# Patient Record
Sex: Female | Born: 1950
Health system: Southern US, Community
[De-identification: ages and names within clinical notes are randomized; demographics above are authoritative.]

## PROBLEM LIST (undated history)

## (undated) DIAGNOSIS — S93699A Other sprain of unspecified foot, initial encounter: Secondary | ICD-10-CM

## (undated) DIAGNOSIS — H269 Unspecified cataract: Secondary | ICD-10-CM

## (undated) DIAGNOSIS — M1990 Unspecified osteoarthritis, unspecified site: Secondary | ICD-10-CM

## (undated) DIAGNOSIS — M545 Low back pain, unspecified: Secondary | ICD-10-CM

## (undated) DIAGNOSIS — R200 Anesthesia of skin: Secondary | ICD-10-CM

## (undated) DIAGNOSIS — G4730 Sleep apnea, unspecified: Secondary | ICD-10-CM

## (undated) DIAGNOSIS — D649 Anemia, unspecified: Secondary | ICD-10-CM

## (undated) DIAGNOSIS — E119 Type 2 diabetes mellitus without complications: Secondary | ICD-10-CM

## (undated) DIAGNOSIS — I10 Essential (primary) hypertension: Secondary | ICD-10-CM

## (undated) DIAGNOSIS — K219 Gastro-esophageal reflux disease without esophagitis: Secondary | ICD-10-CM

## (undated) DIAGNOSIS — E785 Hyperlipidemia, unspecified: Secondary | ICD-10-CM

## (undated) DIAGNOSIS — J40 Bronchitis, not specified as acute or chronic: Secondary | ICD-10-CM

## (undated) DIAGNOSIS — Z8489 Family history of other specified conditions: Secondary | ICD-10-CM

## (undated) DIAGNOSIS — I2510 Atherosclerotic heart disease of native coronary artery without angina pectoris: Secondary | ICD-10-CM

## (undated) HISTORY — PX: TYMPANOPLASTY: SHX33

## (undated) HISTORY — PX: ABDOMINAL HYSTERECTOMY: SHX81

## (undated) HISTORY — DX: Essential (primary) hypertension: I10

## (undated) HISTORY — DX: Type 2 diabetes mellitus without complications: E11.9

## (undated) HISTORY — DX: Low back pain, unspecified: M54.50

## (undated) HISTORY — DX: Gastro-esophageal reflux disease without esophagitis: K21.9

## (undated) HISTORY — DX: Anemia, unspecified: D64.9

## (undated) HISTORY — PX: CORONARY ANGIOPLASTY WITH STENT PLACEMENT: SHX49

## (undated) HISTORY — DX: Other sprain of unspecified foot, initial encounter: S93.699A

## (undated) HISTORY — DX: Low back pain: M54.5

## (undated) HISTORY — PX: TUBAL LIGATION: SHX77

## (undated) HISTORY — PX: UVULOPALATOPHARYNGOPLASTY: SHX827

## (undated) HISTORY — PX: BACK SURGERY: SHX140

---

## 1993-03-25 HISTORY — PX: BREAST CYST ASPIRATION: SHX578

## 1997-07-26 ENCOUNTER — Ambulatory Visit (HOSPITAL_COMMUNITY): Admission: RE | Admit: 1997-07-26 | Discharge: 1997-07-26 | Payer: Self-pay | Admitting: Family Medicine

## 1998-03-29 ENCOUNTER — Ambulatory Visit (HOSPITAL_COMMUNITY): Admission: RE | Admit: 1998-03-29 | Discharge: 1998-03-29 | Payer: Self-pay | Admitting: Family Medicine

## 1998-04-21 ENCOUNTER — Ambulatory Visit (HOSPITAL_COMMUNITY): Admission: RE | Admit: 1998-04-21 | Discharge: 1998-04-21 | Payer: Self-pay | Admitting: Neurology

## 1998-05-30 ENCOUNTER — Encounter (HOSPITAL_COMMUNITY): Admission: RE | Admit: 1998-05-30 | Discharge: 1998-06-05 | Payer: Self-pay | Admitting: Family Medicine

## 1998-08-09 ENCOUNTER — Inpatient Hospital Stay (HOSPITAL_COMMUNITY): Admission: AD | Admit: 1998-08-09 | Discharge: 1998-08-09 | Payer: Self-pay | Admitting: Family Medicine

## 1998-08-18 ENCOUNTER — Ambulatory Visit (HOSPITAL_COMMUNITY): Admission: RE | Admit: 1998-08-18 | Discharge: 1998-08-18 | Payer: Self-pay | Admitting: *Deleted

## 1998-08-29 ENCOUNTER — Other Ambulatory Visit: Admission: RE | Admit: 1998-08-29 | Discharge: 1998-08-29 | Payer: Self-pay | Admitting: Obstetrics & Gynecology

## 1998-08-30 ENCOUNTER — Other Ambulatory Visit: Admission: RE | Admit: 1998-08-30 | Discharge: 1998-08-30 | Payer: Self-pay | Admitting: Obstetrics & Gynecology

## 1998-09-05 ENCOUNTER — Encounter: Payer: Self-pay | Admitting: Obstetrics & Gynecology

## 1998-09-05 ENCOUNTER — Ambulatory Visit (HOSPITAL_COMMUNITY): Admission: RE | Admit: 1998-09-05 | Discharge: 1998-09-05 | Payer: Self-pay | Admitting: Obstetrics & Gynecology

## 1998-09-11 ENCOUNTER — Ambulatory Visit (HOSPITAL_COMMUNITY): Admission: RE | Admit: 1998-09-11 | Discharge: 1998-09-11 | Payer: Self-pay | Admitting: *Deleted

## 1998-11-18 ENCOUNTER — Emergency Department (HOSPITAL_COMMUNITY): Admission: EM | Admit: 1998-11-18 | Discharge: 1998-11-18 | Payer: Self-pay | Admitting: Emergency Medicine

## 1998-11-18 ENCOUNTER — Encounter: Payer: Self-pay | Admitting: Emergency Medicine

## 1998-12-20 ENCOUNTER — Inpatient Hospital Stay (HOSPITAL_COMMUNITY): Admission: RE | Admit: 1998-12-20 | Discharge: 1998-12-22 | Payer: Self-pay | Admitting: Obstetrics & Gynecology

## 1998-12-20 ENCOUNTER — Encounter (INDEPENDENT_AMBULATORY_CARE_PROVIDER_SITE_OTHER): Payer: Self-pay | Admitting: Specialist

## 1999-01-21 ENCOUNTER — Emergency Department (HOSPITAL_COMMUNITY): Admission: EM | Admit: 1999-01-21 | Discharge: 1999-01-21 | Payer: Self-pay | Admitting: Emergency Medicine

## 1999-02-11 ENCOUNTER — Ambulatory Visit (HOSPITAL_COMMUNITY): Admission: RE | Admit: 1999-02-11 | Discharge: 1999-02-11 | Payer: Self-pay | Admitting: Specialist

## 1999-02-11 ENCOUNTER — Encounter: Payer: Self-pay | Admitting: Specialist

## 1999-03-22 ENCOUNTER — Encounter: Payer: Self-pay | Admitting: Specialist

## 1999-03-22 ENCOUNTER — Ambulatory Visit (HOSPITAL_COMMUNITY): Admission: RE | Admit: 1999-03-22 | Discharge: 1999-03-22 | Payer: Self-pay | Admitting: Specialist

## 1999-04-05 ENCOUNTER — Ambulatory Visit (HOSPITAL_COMMUNITY): Admission: RE | Admit: 1999-04-05 | Discharge: 1999-04-05 | Payer: Self-pay | Admitting: Specialist

## 1999-04-05 ENCOUNTER — Encounter: Payer: Self-pay | Admitting: Specialist

## 1999-08-02 ENCOUNTER — Ambulatory Visit (HOSPITAL_COMMUNITY): Admission: EM | Admit: 1999-08-02 | Discharge: 1999-08-02 | Payer: Self-pay | Admitting: Emergency Medicine

## 1999-08-02 ENCOUNTER — Encounter: Payer: Self-pay | Admitting: *Deleted

## 1999-08-03 ENCOUNTER — Encounter: Admission: RE | Admit: 1999-08-03 | Discharge: 1999-08-03 | Payer: Self-pay | Admitting: Family Medicine

## 1999-08-03 ENCOUNTER — Encounter: Payer: Self-pay | Admitting: Family Medicine

## 1999-08-13 ENCOUNTER — Encounter: Payer: Self-pay | Admitting: Family Medicine

## 1999-08-13 ENCOUNTER — Ambulatory Visit (HOSPITAL_COMMUNITY): Admission: RE | Admit: 1999-08-13 | Discharge: 1999-08-13 | Payer: Self-pay | Admitting: Family Medicine

## 1999-10-17 ENCOUNTER — Other Ambulatory Visit: Admission: RE | Admit: 1999-10-17 | Discharge: 1999-10-17 | Payer: Self-pay | Admitting: Obstetrics & Gynecology

## 2000-02-01 ENCOUNTER — Encounter: Payer: Self-pay | Admitting: Family Medicine

## 2000-02-01 ENCOUNTER — Encounter: Admission: RE | Admit: 2000-02-01 | Discharge: 2000-02-01 | Payer: Self-pay | Admitting: Family Medicine

## 2000-02-24 ENCOUNTER — Ambulatory Visit (HOSPITAL_BASED_OUTPATIENT_CLINIC_OR_DEPARTMENT_OTHER): Admission: RE | Admit: 2000-02-24 | Discharge: 2000-02-24 | Payer: Self-pay | Admitting: Family Medicine

## 2000-04-16 ENCOUNTER — Encounter: Admission: RE | Admit: 2000-04-16 | Discharge: 2000-04-28 | Payer: Self-pay | Admitting: Occupational Medicine

## 2000-04-25 ENCOUNTER — Other Ambulatory Visit: Admission: RE | Admit: 2000-04-25 | Discharge: 2000-04-25 | Payer: Self-pay | Admitting: Obstetrics & Gynecology

## 2000-04-25 ENCOUNTER — Encounter (INDEPENDENT_AMBULATORY_CARE_PROVIDER_SITE_OTHER): Payer: Self-pay | Admitting: Specialist

## 2000-08-14 ENCOUNTER — Ambulatory Visit (HOSPITAL_COMMUNITY): Admission: RE | Admit: 2000-08-14 | Discharge: 2000-08-14 | Payer: Self-pay | Admitting: Family Medicine

## 2000-08-14 ENCOUNTER — Encounter: Payer: Self-pay | Admitting: Family Medicine

## 2001-02-17 ENCOUNTER — Other Ambulatory Visit: Admission: RE | Admit: 2001-02-17 | Discharge: 2001-02-17 | Payer: Self-pay | Admitting: Obstetrics & Gynecology

## 2001-02-27 ENCOUNTER — Ambulatory Visit (HOSPITAL_COMMUNITY): Admission: RE | Admit: 2001-02-27 | Discharge: 2001-02-27 | Payer: Self-pay | Admitting: Family Medicine

## 2001-02-27 ENCOUNTER — Encounter: Payer: Self-pay | Admitting: Family Medicine

## 2001-08-13 ENCOUNTER — Encounter: Admission: RE | Admit: 2001-08-13 | Discharge: 2001-11-11 | Payer: Self-pay | Admitting: Family Medicine

## 2001-08-18 ENCOUNTER — Encounter: Payer: Self-pay | Admitting: Family Medicine

## 2001-08-18 ENCOUNTER — Ambulatory Visit (HOSPITAL_COMMUNITY): Admission: RE | Admit: 2001-08-18 | Discharge: 2001-08-18 | Payer: Self-pay | Admitting: Family Medicine

## 2002-02-19 ENCOUNTER — Other Ambulatory Visit: Admission: RE | Admit: 2002-02-19 | Discharge: 2002-02-19 | Payer: Self-pay | Admitting: Obstetrics & Gynecology

## 2002-09-14 ENCOUNTER — Encounter: Payer: Self-pay | Admitting: Obstetrics & Gynecology

## 2002-09-14 ENCOUNTER — Ambulatory Visit (HOSPITAL_COMMUNITY): Admission: RE | Admit: 2002-09-14 | Discharge: 2002-09-14 | Payer: Self-pay | Admitting: Obstetrics & Gynecology

## 2003-09-23 ENCOUNTER — Ambulatory Visit (HOSPITAL_COMMUNITY): Admission: RE | Admit: 2003-09-23 | Discharge: 2003-09-23 | Payer: Self-pay | Admitting: Obstetrics & Gynecology

## 2004-09-27 ENCOUNTER — Ambulatory Visit (HOSPITAL_COMMUNITY): Admission: RE | Admit: 2004-09-27 | Discharge: 2004-09-27 | Payer: Self-pay | Admitting: Obstetrics & Gynecology

## 2005-10-11 ENCOUNTER — Ambulatory Visit (HOSPITAL_COMMUNITY): Admission: RE | Admit: 2005-10-11 | Discharge: 2005-10-11 | Payer: Self-pay | Admitting: Obstetrics & Gynecology

## 2006-09-26 ENCOUNTER — Emergency Department (HOSPITAL_COMMUNITY): Admission: EM | Admit: 2006-09-26 | Discharge: 2006-09-26 | Payer: Self-pay | Admitting: Emergency Medicine

## 2006-10-17 ENCOUNTER — Ambulatory Visit (HOSPITAL_COMMUNITY): Admission: RE | Admit: 2006-10-17 | Discharge: 2006-10-17 | Payer: Self-pay | Admitting: Obstetrics & Gynecology

## 2006-11-07 ENCOUNTER — Ambulatory Visit (HOSPITAL_COMMUNITY): Admission: RE | Admit: 2006-11-07 | Discharge: 2006-11-07 | Payer: Self-pay | Admitting: Gastroenterology

## 2007-01-22 ENCOUNTER — Ambulatory Visit (HOSPITAL_BASED_OUTPATIENT_CLINIC_OR_DEPARTMENT_OTHER): Admission: RE | Admit: 2007-01-22 | Discharge: 2007-01-22 | Payer: Self-pay | Admitting: Otolaryngology

## 2007-03-26 HISTORY — PX: CAROTID STENT: SHX1301

## 2007-10-21 ENCOUNTER — Ambulatory Visit (HOSPITAL_COMMUNITY): Admission: RE | Admit: 2007-10-21 | Discharge: 2007-10-21 | Payer: Self-pay | Admitting: Obstetrics & Gynecology

## 2007-12-14 ENCOUNTER — Ambulatory Visit (HOSPITAL_COMMUNITY): Admission: RE | Admit: 2007-12-14 | Discharge: 2007-12-14 | Payer: Self-pay | Admitting: Family Medicine

## 2008-07-29 ENCOUNTER — Ambulatory Visit (HOSPITAL_BASED_OUTPATIENT_CLINIC_OR_DEPARTMENT_OTHER): Admission: RE | Admit: 2008-07-29 | Discharge: 2008-07-29 | Payer: Self-pay | Admitting: Orthopedic Surgery

## 2008-08-26 ENCOUNTER — Ambulatory Visit (HOSPITAL_COMMUNITY): Admission: RE | Admit: 2008-08-26 | Discharge: 2008-08-26 | Payer: Self-pay | Admitting: Specialist

## 2008-09-22 HISTORY — PX: NM MYOCAR PERF WALL MOTION: HXRAD629

## 2008-10-06 ENCOUNTER — Encounter: Admission: RE | Admit: 2008-10-06 | Discharge: 2008-10-06 | Payer: Self-pay | Admitting: Cardiology

## 2008-10-12 ENCOUNTER — Inpatient Hospital Stay (HOSPITAL_COMMUNITY): Admission: AD | Admit: 2008-10-12 | Discharge: 2008-10-13 | Payer: Self-pay | Admitting: Cardiology

## 2008-10-20 ENCOUNTER — Encounter (HOSPITAL_COMMUNITY): Admission: RE | Admit: 2008-10-20 | Discharge: 2009-01-18 | Payer: Self-pay | Admitting: Cardiology

## 2009-02-01 ENCOUNTER — Ambulatory Visit (HOSPITAL_COMMUNITY): Admission: RE | Admit: 2009-02-01 | Discharge: 2009-02-01 | Payer: Self-pay | Admitting: Family Medicine

## 2009-08-01 HISTORY — PX: DOPPLER ECHOCARDIOGRAPHY: SHX263

## 2010-01-11 ENCOUNTER — Emergency Department (HOSPITAL_COMMUNITY): Admission: EM | Admit: 2010-01-11 | Discharge: 2010-01-11 | Payer: Self-pay | Admitting: Family Medicine

## 2010-03-01 ENCOUNTER — Ambulatory Visit (HOSPITAL_COMMUNITY)
Admission: RE | Admit: 2010-03-01 | Discharge: 2010-03-01 | Payer: Self-pay | Source: Home / Self Care | Attending: Family Medicine | Admitting: Family Medicine

## 2010-04-16 ENCOUNTER — Encounter: Payer: Self-pay | Admitting: Family Medicine

## 2010-06-20 HISTORY — PX: OTHER SURGICAL HISTORY: SHX169

## 2010-06-30 LAB — GLUCOSE, CAPILLARY
Glucose-Capillary: 170 mg/dL — ABNORMAL HIGH (ref 70–99)
Glucose-Capillary: 153 mg/dL — ABNORMAL HIGH (ref 70–99)
Glucose-Capillary: 162 mg/dL — ABNORMAL HIGH (ref 70–99)
Glucose-Capillary: 174 mg/dL — ABNORMAL HIGH (ref 70–99)
Glucose-Capillary: 177 mg/dL — ABNORMAL HIGH (ref 70–99)

## 2010-07-01 LAB — BASIC METABOLIC PANEL
BUN: 14 mg/dL (ref 6–23)
CO2: 25 mEq/L (ref 19–32)
Chloride: 107 mEq/L (ref 96–112)
Glucose, Bld: 147 mg/dL — ABNORMAL HIGH (ref 70–99)
Potassium: 3.4 mEq/L — ABNORMAL LOW (ref 3.5–5.1)

## 2010-07-01 LAB — CBC
HCT: 33.7 % — ABNORMAL LOW (ref 36.0–46.0)
Hemoglobin: 11.3 g/dL — ABNORMAL LOW (ref 12.0–15.0)
MCHC: 33.5 g/dL (ref 30.0–36.0)
MCV: 76.6 fL — ABNORMAL LOW (ref 78.0–100.0)
Platelets: 297 10*3/uL (ref 150–400)
RBC: 4.4 MIL/uL (ref 3.87–5.11)
RDW: 14.9 % (ref 11.5–15.5)
WBC: 10.7 10*3/uL — ABNORMAL HIGH (ref 4.0–10.5)

## 2010-07-01 LAB — GLUCOSE, CAPILLARY
Glucose-Capillary: 117 mg/dL — ABNORMAL HIGH (ref 70–99)
Glucose-Capillary: 137 mg/dL — ABNORMAL HIGH (ref 70–99)

## 2010-07-03 LAB — BASIC METABOLIC PANEL
BUN: 15 mg/dL (ref 6–23)
Calcium: 9.5 mg/dL (ref 8.4–10.5)
Creatinine, Ser: 0.76 mg/dL (ref 0.4–1.2)
GFR calc non Af Amer: >60 mL/min (ref >60)
Glucose, Bld: 173 mg/dL — ABNORMAL HIGH (ref 70–99)
Potassium: 4 mEq/L (ref 3.5–5.1)

## 2010-08-07 NOTE — Discharge Summary (Signed)
[name], [place]                  ACCOUNT NO.:  [id number]   MEDICAL RECORD NO.:  [id number]          PATIENT TYPE:  INP   LOCATION:  2503                         FACILITY:  [facility]   PHYSICIAN:  Sheliah Mends, MD      DATE OF BIRTH:  [birth year]   DATE OF ADMISSION:  10/12/2008  DATE OF DISCHARGE:  10/13/2008                               DISCHARGE SUMMARY   DISCHARGE DIAGNOSES:  1. Abnormal preoperative myocardial perfusion study.  2. Coronary artery disease with elective left anterior descending      XIENCE stenting this admission.  3. Treated hypertension.  4. Dyslipidemia.  5. Type 2 diabetes.  6. Degenerative joint disease with herniated disk, the patient was      seen for preoperative clearance prior to surgical repair.   HOSPITAL COURSE:  The patient is a pleasant 60 year old female, who was  referred to Dr. Garen Lah for evaluation of risk factors prior to  proposed herniated disk repair.  She was seen by Dr. Garen Lah in the  office to set up for outpatient Myoview.  This was done on September 22, 2008.  EF was 83% with mild ischemia in the mid anterior and apical lateral  region.  Echocardiogram revealed normal LV function with evidence of  impaired LV relaxation.  The patient was set up for diagnostic  catheterization, which was done on October 12, 2008, by Dr. Garen Lah.  She  had insignificant disease in the RCA and circumflex with an 85% proximal  LAD stenosis.  She underwent PCI of the LAD with a XIENCE stent by Dr.  Allyson Sabal on October 12, 2008.  We feel that she can be discharged on October 13, 2008.  She will see Dr. Garen Lah in a couple weeks in the office.   LABORATORIES:  White count 10.7, hemoglobin 11.3, hematocrit 33.7, and  platelets 297.  Sodium 137, potassium 3.4, BUN 14, and creatinine 0.75.  Chest x-ray shows no active disease.  EKG shows sinus rhythm without  acute changes.   DISPOSITION:  The patient is discharged in stable condition.  She will  follow up with Dr.  Garen Lah in a couple of weeks.   DISCHARGE MEDICATIONS:  1. Aspirin 325 mg a day.  2. Plavix 75 mg a day.  3. Estradiol 0.25 mg daily.  4. Micardis HCT 40/12.5 daily.  5. TriCor 145 daily.  6. Iron daily.  7. Multivitamin daily.  8. Vicodin 1-2 q.6 h. p.r.n.  9. The patient had been on Nexium and this will be changed to      Protonix.  10.We will also give her prescription for nitroglycerin sublingual      p.r.n.  11.She had been on Avandamet and we will hold this until July 24.      Abelino Derrick, P.A.      Sheliah Mends, MD  Electronically Signed    LKK/MEDQ  D:  10/13/2008  T:  10/14/2008  Job:  [id number]   cc:   Jene Every, M.D.  Pearline Cables, MD

## 2010-08-07 NOTE — Op Note (Signed)
[name], [name]                  ACCOUNT NO.:  [id number]   MEDICAL RECORD NO.:  [id number]          PATIENT TYPE:  AMB   LOCATION:  DSC                          FACILITY:  [facility]   PHYSICIAN:  [name] Fitch. Simpson, M.D. DATE OF BIRTH:  [birth year]   DATE OF PROCEDURE:  07/29/2008  DATE OF DISCHARGE:                               OPERATIVE REPORT   PREOPERATIVE DIAGNOSIS:  Large draining mucoid cyst right long finger  distal interphalangeal joint dorsal aspect with x-ray evidence of  significant degenerative arthritis of distal interphalangeal joint.   POSTOPERATIVE DIAGNOSIS:  Large draining mucoid cyst right long finger  distal interphalangeal joint dorsal aspect with x-ray evidence of  significant degenerative arthritis of distal interphalangeal joint with  identification of significant synovitis and a large subcutaneous mucoid  cyst.   OPERATION:  1. Resection of mucoid cyst.  2. Distal interphalangeal joint debridement and synovectomy.   SURGEON:  [name] Fitch. Sypher, MD   ASSISTANT:  [name] Rusk, PA-C   ANESTHESIA:  Lidocaine 2% metacarpal head level block right long finger  supplemented by IV sedation.   SUPERVISING ANESTHESIOLOGIST:  [name] Person, MD   INDICATIONS:  [name] is a 60 year old woman referred through the  courtesy of Dr. Luanna Simpson.  Clinical examination revealed a large  draining mucoid cyst of the dorsal aspect of the right long finger  adjacent to the DIP joint and nail fold.  She did not have a nail  deformity.  We discussed the potential risks and benefits of  debridement.  We recommended proceeding with debridement to prevent  infection of the distal interphalangeal joint as a consequence of  rupture of the cyst.  Preoperatively, she was advised that we could not  change the underlying osteoarthrosis.  Typically with synovectomy and  joint debridement recurrent cyst formation is minimized.  This should  resolve the problem of the draining sinus  tract.   After informed consent, she is brought to the operating room at this  time.   PROCEDURE:  [name] is brought to the operating room and placed in  supine position upon the operating table.  Following an anesthesia  consult with Dr. Gypsy Simpson, local anesthesia with sedation was recommended  and accepted.  She was brought to room 1 at the [facility] and  placed in supine position upon the operating table.  Following routine  Betadine scrub and paint, the right arm was draped with stockinette and  impervious arthroscopy drapes.  The long finger was exsanguinated with a  gauze wrap and a 1/2-inch Penrose drain placed as a digital tourniquet.  The procedure commenced with a sharp round incision directly over the  cyst.  Subcutaneous tissues were carefully divided taking care to  identify the entire cyst membrane and followed back down to the DIP  joint.  The radial and ulnar aspects of the DIP joint were inspected and  the capsule resected between the terminal extensor tendon slip and the  collateral ligaments.  A micro [name] was used to palpate the joint  followed by irrigation of the joint with a  blunt dental needle and  sterile saline under pressure.  A synovectomy of the DIP joint dorsal  aspect was accomplished with a micro rongeur.   The wound was then repaired with multiple interrupted sutures of 5-0  nylon.  There were no apparent complications.  The tourniquet was  released from the proximal phalangeal segment of the finger with  immediate capillary refill.  Finger was dressed with Xeroflo, sterile  gauze and Coban.   We will see [name] back for followup in approximately 1 week for a  dressing change.  We anticipate suture removal in 7-10 days.      [name], M.D.  Electronically Signed     RVS/MEDQ  D:  07/29/2008  T:  07/30/2008  Job:  [id number]   cc:   [name], M.D.

## 2010-08-07 NOTE — Cardiovascular Report (Signed)
[name], [name]                  ACCOUNT NO.:  [id number]   MEDICAL RECORD NO.:  [id number]          PATIENT TYPE:  INP   LOCATION:  2503                         FACILITY:  [facility]   PHYSICIAN:  Sheliah Mends, MD      DATE OF BIRTH:  20-Jul-1950   DATE OF PROCEDURE:  10/12/2008  DATE OF DISCHARGE:                            CARDIAC CATHETERIZATION   INDICATIONS:  [name] is a 60 year old lady with history of  hypertension, dyslipidemia, and type 2 diabetes mellitus, who was  referred for preoperative evaluation prior to extensive back surgery.  As part of her preoperative evaluation, she underwent a myocardial  perfusion scan that showed evidence of ischemia in the anterior wall.  Given [name]'s risk factors and abnormal stress test findings, a  decision was made to proceed with cardiac catheterization.   PROCEDURE:  1. Selective coronary angiography.  2. Left ventriculography.  3. Left heart catheterization.   After informed consent was obtained, the patient was brought to the  Second Floor Cardiac Catheterization Lab at [facility] in the  postabsorptive state.  She was prepped and draped in a sterile fashion.  Local anesthesia was applied to the right groin using 1% lidocaine.  Conscious sedation was began using Versed and fentanyl.  A 5-French  arterial sheath was inserted into the right femoral artery using the  modified Seldinger technique.  A 6-French right and left Judkins  catheter was used for selective coronary angiography.  A pigtail  catheter was used for left ventriculography was.  Subsequently, the 6-  [place] RCA catheter was exchanged for 4-French catheter to image the RCA  ostium.   FINDINGS:  Hemodynamics:  Aortic pressure 158/65 mmHg.  LV pressure  164/4 mmHg.  There was no significant aortic gradient noted.   Left ventriculography:  The left ventriculography showed normal left  ventricular size and function.   SELECTIVE CORONARY ANGIOGRAPHY:   Left main coronary artery:  The left  main coronary artery is a medium-sized vessel that bifurcates in the LAD  and left circumflex artery.  The left main coronary artery has 10%, mild  diffuse lesion in the distal segment.   Left anterior descending artery:  The left anterior descending artery is  a large vessel.  It gives off a large diagonal branch.  There is a  focal, very eccentric 85% lesion in the proximal segment of the LAD  prior to the first septal branch.  There are some mild luminal  irregularities in the reminder of the vessel without hemodynamic  significant lesion.  The diagonal artery is free of disease.   Left circumflex artery:  The left circumflex artery is a large vessel  that gives a large obtuse marginal branch off.  There is a 10% lesion at  the ostium and 20% in the proximal and mid segment of the left  circumflex artery.  The obtuse marginal branch is a large, branching  vessel without significant disease.   RCA:  The RCA is a dominant vessel that gives off a PDA branch.  The RCA  has mild luminal irregularities  and a 20% lesion in the mid segment, but  is otherwise without hemodynamic significant lesion.   SUMMARY:  1. High-grade, 85% lesion in the proximal left anterior descending      artery.  2. Mild diffuse disease in the left main, left circumflex, and RCA      territory without hemodynamic significance.  3. Normal left ventricular size and function.  4. No hemodynamic significant valve disease seen.   PLAN:  [name] will proceed to percutaneous coronary intervention with  Dr. Allyson Sabal.  She understands the nature of the procedure including risks  and benefits and agreed to proceed.   The procedure was completed.  The diagnostic part of the procedure was  completed without complications.      Sheliah Mends, MD  Electronically Signed     JE/MEDQ  D:  10/12/2008  T:  10/12/2008  Job:  [id number]

## 2010-08-07 NOTE — Op Note (Signed)
[name], [name]                  ACCOUNT NO.:  [id number]   MEDICAL RECORD NO.:  [id number]          PATIENT TYPE:  AMB   LOCATION:  ENDO                         FACILITY:  [facility]   PHYSICIAN:  Anselmo Rod, M.D.  DATE OF BIRTH:  December 12, 1950   DATE OF PROCEDURE:  11/07/2006  DATE OF DISCHARGE:                               OPERATIVE REPORT   PROCEDURE PERFORMED:  Screening colonoscopy.   ENDOSCOPIST:  Anselmo Rod, M.D.   INSTRUMENT USED:  Pentax video colonoscope.   INDICATIONS FOR PROCEDURE:  A 60 year old white female undergoing a  screening colonoscopy to rule out colonic polyps, masses, etcetera.   PREPROCEDURE PREPARATION:  Informed consent was procured from the  patient.  The patient fasted for 8 hours prior to the procedure and  prepped with a bottle of magnesium citrate in a gallon of NuLYTELY the  night prior to procedure.  From Risks and benefits of the procedure  including a 10% missed rate of cancer and polyps were discussed with the  patient as well.   PREPROCEDURE PHYSICAL:  VITAL SIGNS: The patient with stable vital  signs.  NECK:  Supple.  CHEST:  Clear to auscultation.  HEART:  S1, S2 regular.  ABDOMEN:  Soft with normal bowel sounds.   DESCRIPTION OF PROCEDURE:  The patient was placed in the left lateral  decubitus position and sedated with 100 mcg of Fentanyl and 12 mg of  Versed given intravenously in slow incremental doses. Once the patient  was adequately sedated and maintained on low-flow oxygen, and continuous  cardiac monitoring; the Pentax video colonoscope was advanced from the  rectum to the cecum.  The appendiceal orifice and cecal valve were  clearly visualized and photographed. The terminal ileum appeared healthy  without lesions.  No masses, polyps, erosions, ulcerations, or  diverticula were noted. Retroflexion in the rectum revealed no  abnormality.  The patient tolerated the procedure well without  complications.   IMPRESSION:   Normal colonoscopy up to the terminal ileum with no masses,  polyps or diverticula noted.   RECOMMENDATIONS:  1. Continue a high fiber diet with liberal fluid intake.  2. Repeat colonoscopy in the next 10 years unless the patient has any      abnormal symptoms in the interim; in which case she should contact      the office, immediately, for further recommendations.      Anselmo Rod, M.D.  Electronically Signed     JNM/MEDQ  D:  11/07/2006  T:  11/07/2006  Job:  [id number]   cc:   Quita Skye. Artis Flock, M.D.  Fax: [phone]   Genia Del, M.D.  Fax: [phone]

## 2010-08-07 NOTE — Op Note (Signed)
NAMEKRISTIA, [name]                  ACCOUNT NO.:  [id number]   MEDICAL RECORD NO.:  [id number]          PATIENT TYPE:  AMB   LOCATION:  DSC                          FACILITY:  [facility]   PHYSICIAN:  Carolan Shiver, M.D.    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  Mrs.  [name] made had previously undergone type 1 tympanoplasty, right ear, by  Dr. Prescott Gum many years ago.  She has developed a recurrent  perforation and had noticed decreased hearing in the right ear and had  been treated by Dr. Artis Flock with amoxicillin and Cipro drops.  Her URI was  on September 25, 2006.  Since that time she has had some minor disequilibrium  and occasional nausea with a burning sensation in her right ear, with  pain radiating into her right mastoid tip.  On November 13, 2006, she was  diagnosed as having of 30% dry central inferior perforation AD.  She had  an atrophic TM, left ear, status post tympanoplasty.  She was found to  have moderate mixed hearing loss, right ear, with a moderate high-  frequency sensorineural hearing loss, left ear.  Preop audiometric  testing on January 20, 2007, documented an SRT of 30 diabetes, right  ear, with 100% discrimination, 15 diabetes, left ear, with 100%  discrimination.  A CT scan her temporal bones was obtained on November 17, 2006, and showed no evidence of any mastoid involvement on the right  side.  She was therefore recommended for a revision type 1 cartilage  tympanoplasty under general endotracheal anesthesia at [facility]  under general endotracheal anesthesia as an outpatient.  Risks and  complications of the procedures were explained to her, questions were  invited and  answered. and informed consent was signed and witnessed.   JUSTIFICATION FOR OUTPATIENT SETTING:  This patient's age and need for  general endotracheal anesthesia.   JUSTIFICATION FOR OVERNIGHT STAY:  Not applicable.   PREOPERATIVE DIAGNOSIS:  A 30% dry central anterior perforation, right  ear.   POSTOPERATIVE DIAGNOSIS:  A 30% dry central anterior perforation, right  ear.   OPERATION:  1. Revision type 1 cartilage tympanoplasty, right ear.  2. Microdissection using the operating room microscope.   SURGEON:  Carolan Shiver, M.D.   ANESTHESIA:  General endotracheal, W. Autumn Patty, MD   COMPLICATIONS:  None.   DISCHARGE STATUS:  Stable.   SUMMARY OF REPORT:  After the patient was taken to the operating room,  she was placed in the supine position.  An IV had been begun in the  holding area.  General IV induction was then performed under the  guidance of Dr. Sampson Goon.  The patient was orally intubated without  difficulty, eyelids were taped shut, and she was properly positioned and  monitored.  Elbows and ankles were padded with foam rubber and a time-  out was performed.  A small amount of hair was clipped in the right  postauricular area.  Hair was taped and a stocking cap was applied.  The  right postauricular area was infiltrated with 3 mL of 1% Xylocaine with  1:100,000 epinephrine including a small amount of local anesthesia  infiltrated into the right tragus.  Her right ear hemiface were then  prepped with Betadine and draped in a standard fashion for a revision  tympanoplasty.   Examination of the right ear canal revealed a 7-mm diameter canal.  There was a 30% dry central anterior perforation, through which could be  seen healthy middle ear mucosa.  There was no evidence of any  cholesteatoma.  Four quadrant ear canal blocks then performed with 1 mL  of 2% Xylocaine with 1:15,000 epinephrine.   A 1-cm incision was then made in the medial surface of the right  tragus.  A composite cartilage-perichondrial autograft was then harvested.  The  incision was closed with interrupted 5-0 Novofils and bacitracin  ointment was applied.   The epithelial margin of the perforation was then excised with a 5910  Beaver blade.  The mucosal surface was rasped with a 45-degree McCabe  perforation rasp.   An atticotomy flap was then elevated with McCabe and [employer] and  the middle ear was entered without difficulty.  The ossicles were found  to be intact and mobile.  There was healthy middle ear mucosa, no  evidence of any cholesteatoma.   The previously-harvested CCPA graft was then trimmed with a eBay,  leaving a disc oval of cartilage on the perichondrium and a cuff of  normal perichondrium around the oval as well as a tail of perichondrium.  The graft was then placed with the cartilage side lateral.  This was  placed in the perforation and the perichondrium was then draped up the  posterior bony canal wall.  The graft was then brought back out through  the perforation and folded 180 degrees on itself.  The hypotympanum was  then packed through the perforation using Gelfoam discs soaked in Cipro-  HC.  The graft was then rotated 180 degrees and the perichondrial cuff  was tucked around the edges of the perforation for 360 degrees with the  cartilage filling the perforation.  The posterior hypotympanum was then  packed with Gelfoam discs soaked in Cipro-HC to support the graft  posteriorly.  The perichondrium and atticotomy flap were then draped up  the posterior bony canal wall into anatomic position.  Medial canal was  then packed with Gelfoam discs soaked in Cipro-HC.  A bacitracin cotton  ball was placed in the ear canal.  The patient was awakened, extubated  and transferred to her hospital bed.  She appeared to tolerate both the  general endotracheal anesthesia and the procedures well and left the  operating room in stable condition.    TOTAL FLUIDS:  750 mL.   ESTIMATED BLOOD LOSS:  Less than 5 mL.   Sponge, needle, and cotton ball counts were correct at termination of  the procedure.  The patient received Ancef 1 g IV, Zofran 4 mg IV at the  beginning and end of the procedure, and Decadron 10 mg IV.   Mrs. Montano will be discharged today as an outpatient with her husband.  He will be instructed to return her to my office on January 29, 2007, at  1:20 p.m.  Discharge medications include Cefzil 500 mg p.o. b.i.d. x10  days with food, Cipro-HC three drops right ear t.i.d. x1 week, Percocet  5/325 mg #30 one to two p.o. q.6h. p.r.n. pain, and Phenergan  suppositories 25 mg #2 one p.r. q.6h p.r.n. nausea.  She is to keep her  head elevated, avoid water  exposure, right ear, x6 weeks, avoid nose-blowing or sneezing without  her mouth open, and follow a regular diet.  She is to call [phone] for  any postoperative problems related to the procedure.  She will be given  both verbal and written instructions.  Postop audiometric testing will  be performed in the office           ______________________________  Carolan Shiver, M.D.     EMK/MEDQ  D:  01/22/2007  T:  01/22/2007  Job:  [id number]

## 2010-08-07 NOTE — Cardiovascular Report (Signed)
NAMEARNETHA, SILVERTHORNE                  ACCOUNT NO.:  [id number]   MEDICAL RECORD NO.:  [id number]          PATIENT TYPE:  INP   LOCATION:  2503                         FACILITY:  [facility]   PHYSICIAN:  Nanetta Batty, M.D.   DATE OF BIRTH:  10-01-1950   DATE OF PROCEDURE:  DATE OF DISCHARGE:                            CARDIAC CATHETERIZATION   HISTORY OF PRESENT ILLNESS:  Ms. Mcniel is a 60 year old female patient of  Dr. Garen Lah who was evaluated preoperatively for planned spine surgery.  Risk factors include hypertension, hyperlipidemia, type 2 diabetes, and  obesity.  A Myoview stress test showed subtle anterior ischemia.  She  underwent diagnostic coronary arteriography by Dr. Garen Lah today  revealing an 80% fairly focal proximal LAD lesion at the first septal  perforator with normal LV function.  She presents now for PCI and  stenting.   DESCRIPTION OF PROCEDURE:  The patient was already in the Cath Lab  having undergone diagnostic coronary arteriography by Dr. Garen Lah.  She  had a 6-French sheath in the right femoral artery and had received four  baby aspirins.  She received 600 mg of p.o. Plavix, 20 mg of Pepcid I.V.  and Angiomax bolus with ACT documented 293.  Visipaque dye was used for  the entirety of the case.  Retrograde aortic pressures were monitored in  the case.   Using a 6-French XBLAD 3.5 guide catheter along with an 0.014 x 190  [employer] soft wire and a 3.0 x 15 Xience V Abbott drug-eluting stent.  PCI  was performed with delivery at 14 atmospheres.  Intracoronary  nitroglycerin 200 mcg was administered.  Postdilatation was performed  with a 3.25 mm x 12 mm long Simpson Voyager at 16 atmospheres resulting  reduction of 80% stenosis to 0% residual without dissection.  The  patient tolerated the procedure well.  The guidewire and catheter were  removed and sheath was sewn securely in place.  The patient left the lab  in stable condition.   PLAN:  To remove the sheath in 2-3  hours, treat with aspirin and Plavix,  gently hydrate and discharge home in the morning.  The patient will  follow up with Dr. Garen Lah.  She left the lab in stable condition.      Nanetta Batty, M.D.  Electronically Signed     JB/MEDQ  D:  10/12/2008  T:  10/12/2008  Job:  [id number]   cc:   Second Floor  Cardiac Cath Lab  [facility]  Sheliah Mends, MD  Pearline Cables, MD

## 2010-08-10 NOTE — Discharge Summary (Signed)
[facility]  Patient:    [name]                          MRN: [id number] Adm. Date:  [id number] Disc. Date: 12/22/98 Attending:  Genia Del                           Discharge Summary  REASON FOR ADMISSION:  Left pelvic pain with left persistent simple ovarian cyst and abnormal uterine bleeding on hormone replacement therapy in a patient with ild uterine leiomyoma.  DISCHARGE DIAGNOSES: 1. Left pelvic pain with left persistent simple ovarian cyst. 2. Abnormal uterine bleeding on hormone replacement therapy. 3. Uterine leiomyoma. 4. Pathology report - uterus, salpinx, tubes and ovaries, cervix with no pathologic    abnormality, benign proliferative endometrium, adenomyosis, bilateral benign    peritubal cysts, bilateral ovaries with no pathologic abnormalities.  PROCEDURES:  Total abdominal hysterectomy with bilateral salpingo-oophorectomy one on September 27.  HOSPITAL COURSE:  Unremarkable.  The patient did not develop any complications.  The postoperative hemoglobin was 11.1 on September 28 with hematocrit of 34.4. The patient was discharged home on September 29 with Darvocet and Motrin p.r.n. for  pain.  The patient will stop Provera since hysterectomy was performed and will continue Climara patches for estrogen replacement.  She will be followed up on January 12, 1999 at [facility] office and her staples will be removed on postoperative day #3 at [facility] office. DD:  01/27/99 TD:  01/29/99 Job: [id number]

## 2010-08-10 NOTE — Procedures (Signed)
[facility]  Patient:    [name], [name]                         MRN: [id number] Proc. Date: 08/02/99 Adm. Date:  [id number] Disc. Date: [id number] Attending:  Donnetta Hutching CC:         Donnetta Hutching, M.D.                           Procedure Report  PROCEDURE PERFORMED:  Video upper endoscopy.  ENDOSCOPIST:  Roosvelt Harps, M.D.  INDICATIONS FOR PROCEDURE:  The patient is a 61 year old female with suspected foreign body meat impaction in the distal esophagus.  However, she has been getting small amounts of liquid down and when I questioned her about the discomfort, she says that although it feels like something is stuck and it hurts to swallow, she is having a lot of chest pain that radiates into her right scapula raising a question of biliary disease.  PREPARATION:  She is n.p.o.  PREPROCEDURE SEDATION:  She received 70 mg of Demerol and 7 mg of Versed intravenously.  In addition, her throat was anesthetized with Hurricaine spray nd she was on 2L of nasal cannula O2.  PREPROCEDURE EXAM:  Clear lungs.  Normal heart sounds.  Obese abdomen with minimal epigastric tenderness.  No masses.  DESCRIPTION OF PROCEDURE:  The Olympus video upper endoscope was inserted via the mouth and advanced easily through the upper esophageal sphincter.  Intubation was then carried out to the descending duodenum.  On withdrawal the mucosa was carefully evaluated and found to be entirely normal from descending duodenum through entire stomach and esophagus.  There was no evidence of esophageal stricture or any foreign body being present.  There was no inflammation or peptic ulcer disease.  IMPRESSION:  Normal upper endoscopy.  Suspect biliary problem.  PLAN:  CBC, liver function tests and amylase ordered and the patient is sent for an urgent ultrasound. DD:  08/02/99 TD:  08/06/99 Job: [id number]

## 2010-11-12 ENCOUNTER — Ambulatory Visit
Admission: RE | Admit: 2010-11-12 | Discharge: 2010-11-12 | Disposition: A | Discharge status: Home / Self Care | Payer: 59 | Source: Ambulatory Visit | Attending: Emergency Medicine | Admitting: Emergency Medicine

## 2010-11-12 ENCOUNTER — Other Ambulatory Visit: Payer: Self-pay | Admitting: Emergency Medicine

## 2010-11-12 DIAGNOSIS — R1011 Right upper quadrant pain: Secondary | ICD-10-CM

## 2011-01-02 LAB — BASIC METABOLIC PANEL
BUN: 17
CO2: 24
Calcium: 9.8
Chloride: 101
Creatinine, Ser: 0.7
GFR calc Af Amer: >60
GFR calc non Af Amer: >60
Glucose, Bld: 130 — ABNORMAL HIGH
Potassium: 3.9
Sodium: 136

## 2011-01-02 LAB — PROTIME-INR
INR: 0.9
Prothrombin Time: 12.3

## 2011-01-02 LAB — URINE MICROSCOPIC-ADD ON

## 2011-01-02 LAB — URINALYSIS, ROUTINE W REFLEX MICROSCOPIC
Bilirubin Urine: NEGATIVE
Glucose, UA: NEGATIVE
Ketones, ur: NEGATIVE
Nitrite: NEGATIVE
Protein, ur: NEGATIVE
Specific Gravity, Urine: 1.022
Urobilinogen, UA: 0.2
pH: 5.5

## 2011-01-02 LAB — APTT: aPTT: 26

## 2011-01-02 LAB — CBC
HCT: 35.7 — ABNORMAL LOW
Hemoglobin: 11.8 — ABNORMAL LOW
MCHC: 33.1
MCV: 74.2 — ABNORMAL LOW
Platelets: 335
RBC: 4.81
RDW: 14.4 — ABNORMAL HIGH
WBC: 12.6 — ABNORMAL HIGH

## 2011-01-08 LAB — POCT CARDIAC MARKERS
CKMB, poc: <1 — ABNORMAL LOW
Myoglobin, poc: 55.5
Operator id: 161631
Troponin i, poc: <0.05

## 2011-01-08 LAB — DIFFERENTIAL
Basophils Absolute: 0.1
Basophils Relative: 1
Eosinophils Absolute: 0.3
Eosinophils Relative: 2
Lymphocytes Relative: 30
Lymphs Abs: 3.7 — ABNORMAL HIGH
Monocytes Absolute: 0.6
Monocytes Relative: 5
Neutro Abs: 7.7
Neutrophils Relative %: 62

## 2011-01-08 LAB — CBC
HCT: 38
Hemoglobin: 12.4
MCHC: 32.6
MCV: 75.4 — ABNORMAL LOW
Platelets: 326
RBC: 5.04
RDW: 14.9 — ABNORMAL HIGH
WBC: 12.4 — ABNORMAL HIGH

## 2011-01-08 LAB — COMPREHENSIVE METABOLIC PANEL
ALT: 11
AST: 16
Albumin: 3.6
Alkaline Phosphatase: 87
BUN: 13
CO2: 27
Calcium: 9.3
Chloride: 106
Creatinine, Ser: 0.62
GFR calc Af Amer: >60
GFR calc non Af Amer: >60
Glucose, Bld: 110 — ABNORMAL HIGH
Potassium: 3.8
Sodium: 141
Total Bilirubin: 0.6
Total Protein: 6.4

## 2011-01-08 LAB — D-DIMER, QUANTITATIVE (NOT AT ARMC): D-Dimer, Quant: 0.36

## 2011-02-06 ENCOUNTER — Other Ambulatory Visit (HOSPITAL_COMMUNITY): Payer: Self-pay | Admitting: Family Medicine

## 2011-02-06 DIAGNOSIS — Z90710 Acquired absence of both cervix and uterus: Secondary | ICD-10-CM

## 2011-02-21 ENCOUNTER — Ambulatory Visit (HOSPITAL_COMMUNITY)
Admission: RE | Admit: 2011-02-21 | Discharge: 2011-02-21 | Disposition: A | Discharge status: Home / Self Care | Payer: 59 | Source: Ambulatory Visit | Attending: Family Medicine | Admitting: Family Medicine

## 2011-02-21 DIAGNOSIS — Z13820 Encounter for screening for osteoporosis: Secondary | ICD-10-CM | POA: Insufficient documentation

## 2011-02-21 DIAGNOSIS — Z90710 Acquired absence of both cervix and uterus: Secondary | ICD-10-CM | POA: Insufficient documentation

## 2011-02-22 ENCOUNTER — Ambulatory Visit (HOSPITAL_COMMUNITY): Payer: 59

## 2011-02-22 ENCOUNTER — Other Ambulatory Visit (HOSPITAL_COMMUNITY): Payer: Self-pay | Admitting: Family Medicine

## 2011-02-22 DIAGNOSIS — Z1231 Encounter for screening mammogram for malignant neoplasm of breast: Secondary | ICD-10-CM

## 2011-03-28 ENCOUNTER — Ambulatory Visit (HOSPITAL_COMMUNITY)
Admission: RE | Admit: 2011-03-28 | Discharge: 2011-03-28 | Disposition: A | Discharge status: Home / Self Care | Payer: 59 | Source: Ambulatory Visit | Attending: Family Medicine | Admitting: Family Medicine

## 2011-03-28 DIAGNOSIS — Z1231 Encounter for screening mammogram for malignant neoplasm of breast: Secondary | ICD-10-CM | POA: Insufficient documentation

## 2011-08-24 ENCOUNTER — Ambulatory Visit (INDEPENDENT_AMBULATORY_CARE_PROVIDER_SITE_OTHER): Payer: 59 | Admitting: Family Medicine

## 2011-08-24 VITALS — BP 122/74 | HR 66 | Temp 97.8°F | Resp 16 | Ht 61.5 in | Wt 192.0 lb

## 2011-08-24 DIAGNOSIS — E119 Type 2 diabetes mellitus without complications: Secondary | ICD-10-CM

## 2011-08-24 DIAGNOSIS — M549 Dorsalgia, unspecified: Secondary | ICD-10-CM

## 2011-08-24 LAB — POCT GLYCOSYLATED HEMOGLOBIN (HGB A1C): Hemoglobin A1C: 5.3

## 2011-08-24 MED ORDER — HYDROCODONE-ACETAMINOPHEN 5-500 MG PO TABS: 1.0000 | ORAL_TABLET | Freq: Three times a day (TID) | ORAL | Status: AC | PRN

## 2011-08-24 MED ORDER — PREDNISONE 20 MG PO TABS: ORAL_TABLET | ORAL | Status: AC

## 2011-08-24 MED ORDER — CYCLOBENZAPRINE HCL 10 MG PO TABS: 10.0000 mg | ORAL_TABLET | Freq: Two times a day (BID) | ORAL | Status: AC | PRN

## 2011-08-24 NOTE — Progress Notes (Signed)
Patient Name: [name] Date of Birth: 01/20/1951 Medical Record Number: [id number] Gender: female Date of Encounter: 08/24/2011  History of Present Illness:  [name] is a 61 y.o. very pleasant female patient who presents with the following:  Went to chase a neighbor's dog out of her garden yesterday- she "threw her back out" when she ran.  She has a history of back pain but she has not had a flare in several years.   The pain is going down her left thigh.  Her left toes are "a little numb."   No saddle anesthesia, no bowel or bladder control problems.  She had some valium which she took- she also took some tylenol.  She was supposed to have a herniated disc repair in 2010, but she then ended up having CAD discovered during her pre-op work up and she had a stent to the LAD- the surgery never went forward.   Last A1c in November of 2012- 5.9  There is no problem list on file for this patient.  No past medical history on file. No past surgical history on file. History  Substance Use Topics  . Smoking status: Not on file  . Smokeless tobacco: Not on file  . Alcohol Use: Not on file   No family history on file. No Known Allergies  Medication list has been reviewed and updated.  Prior to Admission medications   Medication Sig Start Date End Date Taking? Authorizing Provider  cholecalciferol (VITAMIN D) 1000 UNITS tablet Take 1,000 Units by mouth daily.   Yes Historical Provider, MD  clopidogrel (PLAVIX) 75 MG tablet Take 75 mg by mouth daily.   Yes Historical Provider, MD  famotidine (PEPCID) 10 MG tablet Take 10 mg by mouth 2 (two) times daily.   Yes Historical Provider, MD  ferrous gluconate (FERGON) 325 MG tablet Take 325 mg by mouth daily with breakfast.   Yes Historical Provider, MD  hydrochlorothiazide (HYDRODIURIL) 25 MG tablet Take 25 mg by mouth daily.   Yes Historical Provider, MD  pioglitazone-metformin (ACTOPLUS MET) 15-500 MG per tablet Take 1 tablet by mouth 2  (two) times daily with a meal.   Yes Historical Provider, MD  Potassium Chloride (KLOR-CON PO) Take 20 mEq by mouth daily.   Yes Historical Provider, MD  telmisartan (MICARDIS) 40 MG tablet Take 40 mg by mouth daily.   Yes Historical Provider, MD    Review of Systems:  As per HPI- otherwise negative.   Physical Examination: Filed Vitals:   08/24/11 1226  BP: 122/74  Pulse: 66  Temp: 97.8 F (36.6 C)  Resp: 16   Filed Vitals:   08/24/11 1226  Height: 5' 1.5" (1.562 m)  Weight: 192 lb (87.091 kg)   Body mass index is 35.69 kg/(m^2).  GEN: WDWN, NAD, Non-toxic, A & O x 3 HEENT: Atraumatic, Normocephalic. Neck supple. No masses, No LAD. Ears and Nose: No external deformity. CV: RRR, No M/G/R. No JVD. No thrill. No extra heart sounds. PULM: CTA B, no wheezes, crackles, rhonchi. No retractions. No resp. distress. No accessory muscle use. ABD: S, NT, ND, +BS. No rebound. No HSM. EXTR: No c/c/e NEURO Normal gait.  PSYCH: Normally interactive. Conversant. Not depressed or anxious appearing.  Calm demeanor.  Back: tenderness left buttock and left lumbar area.  Restricted ROM to flexion and extension.  Normal DTR, strength and sensation bilaterally.  Negative straight leg rasie  Results for orders placed in visit on 08/24/11  POCT GLYCOSYLATED HEMOGLOBIN (HGB A1C)  Component Value Range   Hemoglobin A1C 5.3      Assessment and Plan: 1. Diabetes mellitus type II  POCT glycosylated hemoglobin (Hb A1C)  2. Back pain  predniSONE (DELTASONE) 20 MG tablet, HYDROcodone-acetaminophen (VICODIN) 5-500 MG per tablet, cyclobenzaprine (FLEXERIL) 10 MG tablet   Explained that steroids will raise her glucose- she understands and is ok with this.  Watch diet and drink plenty of fluids.  Patient (or parent if minor) instructed to return to clinic or call if not better in 3-4 day(s).  Sooner if worse.      Abbe Amsterdam, MD 08/24/2011 12:34 PM

## 2012-02-26 ENCOUNTER — Ambulatory Visit: Payer: 59

## 2012-02-26 ENCOUNTER — Ambulatory Visit (INDEPENDENT_AMBULATORY_CARE_PROVIDER_SITE_OTHER): Payer: 59 | Admitting: Emergency Medicine

## 2012-02-26 VITALS — BP 129/72 | HR 73 | Temp 97.9°F | Resp 16 | Ht 62.0 in | Wt 201.0 lb

## 2012-02-26 DIAGNOSIS — M25569 Pain in unspecified knee: Secondary | ICD-10-CM

## 2012-02-26 DIAGNOSIS — M25559 Pain in unspecified hip: Secondary | ICD-10-CM

## 2012-02-26 MED ORDER — MELOXICAM 7.5 MG PO TABS: 7.5000 mg | ORAL_TABLET | Freq: Every day | ORAL | Status: DC

## 2012-02-26 NOTE — Patient Instructions (Addendum)
Begin taking the Mobic once daily with food.  Take this for at least the next week.  If you are seeing improvement with this, continue it for one more week.  Begin icing and elevating the knee 2-3 times per day for 15-20 minutes at a time.  Let us know if you are not seeing improvement.  We can proceed with physical therapy or an appointment with orthopedics.

## 2012-02-26 NOTE — Progress Notes (Signed)
Subjective:    Patient ID: [name], female    DOB: [birth year], 61 y.o.   MRN: [id number]  HPI   [name] is a 61 yr old female here with 2-3 weeks of right knee pain.  Pain is waking her up at night.  Worse with activity.  Being up walking causes pain.  Stairs cause pain.  Pain is somewhat relieved with rest.  Over the last couple days she has felt like the knee was going to give out.  No popping or clicking.  Has been using 500mg  Tylenol TID for pain relief.  No MOI that she can recall.  Several weeks ago she did play kickball with a neighbor boy, but does not recall any injury.  Additionally has some right hip pain that she relates to a back injury.  Has hx herniated L4-5 and L5-S1.  Has low back pain at baseline.  No previous knee problems.  Left knee is ok.  No fever, chills, redness, swelling.     Review of Systems  Constitutional: Negative for fever and chills.  HENT: Negative.   Respiratory: Negative.   Cardiovascular: Negative.   Gastrointestinal: Negative.   Musculoskeletal: Positive for back pain and arthralgias (right knee, right hip). Negative for myalgias and joint swelling.  Neurological: Negative.        Objective:   Physical Exam  Vitals reviewed. Constitutional: She is oriented to person, place, and time. She appears well-developed and well-nourished. No distress.  HENT:  Head: Normocephalic and atraumatic.  Musculoskeletal: Normal range of motion.       Right hip: She exhibits tenderness (trochanteric bursa). She exhibits normal strength, no swelling and no deformity.       Left hip: Normal.       Right knee: She exhibits normal range of motion, no swelling, no effusion, no ecchymosis, no deformity, no erythema, no LCL laxity, no bony tenderness and no MCL laxity. tenderness found. Medial joint line tenderness noted. No lateral joint line, no MCL, no LCL and no patellar tendon tenderness noted.       Left knee: Normal.       Increased pain in the medial aspect on  McMurray's, but no popping or clicking  Neurological: She is alert and oriented to person, place, and time. She has normal reflexes.  Skin: Skin is warm and dry.  Psychiatric: She has a normal mood and affect. Her behavior is normal.     Filed Vitals:   02/26/12 1015  BP: 129/72  Pulse: 73  Temp: 97.9 F (36.6 C)  Resp: 16     [facility] reading (PRIMARY) by  Dr. Cleta Alberts - joint space appears normal; please comment on proximal tibia, does this need to be followed?       Assessment & Plan:   1. Knee pain  DG Knee Complete 4 Views Right, meloxicam (MOBIC) 7.5 MG tablet  2. Hip pain  meloxicam (MOBIC) 7.5 MG tablet    [name] is a 61 yr old female with right hip and right knee pain.  Suspect trochanteric bursitis as the source of her hip pain as she has point tenderness over the bursa.  ROM and strength in the hip and knee are both full and equal.  There is some tenderness over the medial joint line with some increased pain on McMurray's.  X-ray revealed a normal appearing joint.  Possible osteopenia/osteoporotic changes, which patient states she is aware of.  She takes calcium and vitamin D supplements.  Suspect  a possible meniscal tear given symptoms.  Will do a trial of meloxicam, ice, elevation, and rest for 7-14 days.  If she is not seeing improvement, we can try a course of physical therapy, or refer her on to orthopedics  Cautioned patient about possible increased risk of GI bleeding with NSAID, ASA, and plavix.  Pt voiced understanding will let us know if any symptoms develop.  She is in agreement with this plan.

## 2012-04-15 ENCOUNTER — Other Ambulatory Visit: Payer: Self-pay | Admitting: Family Medicine

## 2012-04-15 DIAGNOSIS — Z1231 Encounter for screening mammogram for malignant neoplasm of breast: Secondary | ICD-10-CM

## 2012-04-16 ENCOUNTER — Ambulatory Visit (INDEPENDENT_AMBULATORY_CARE_PROVIDER_SITE_OTHER): Payer: 59 | Admitting: Family Medicine

## 2012-04-16 VITALS — BP 146/76 | HR 58 | Temp 97.5°F | Resp 20 | Ht 62.0 in | Wt 202.0 lb

## 2012-04-16 DIAGNOSIS — E1159 Type 2 diabetes mellitus with other circulatory complications: Secondary | ICD-10-CM | POA: Insufficient documentation

## 2012-04-16 DIAGNOSIS — E119 Type 2 diabetes mellitus without complications: Secondary | ICD-10-CM

## 2012-04-16 DIAGNOSIS — M549 Dorsalgia, unspecified: Secondary | ICD-10-CM

## 2012-04-16 DIAGNOSIS — I10 Essential (primary) hypertension: Secondary | ICD-10-CM

## 2012-04-16 LAB — BASIC METABOLIC PANEL WITH GFR
BUN: 15 mg/dL (ref 6–23)
CO2: 28 meq/L (ref 19–32)
Calcium: 10.1 mg/dL (ref 8.4–10.5)
Chloride: 105 meq/L (ref 96–112)
Creat: 0.71 mg/dL (ref 0.50–1.10)
Glucose, Bld: 99 mg/dL (ref 70–99)
Potassium: 4.4 meq/L (ref 3.5–5.3)
Sodium: 141 meq/L (ref 135–145)

## 2012-04-16 LAB — POCT SEDIMENTATION RATE: POCT SED RATE: 16 mm/hr (ref 0–22)

## 2012-04-16 LAB — RHEUMATOID FACTOR: Rhuematoid fact SerPl-aCnc: <10 IU/mL (ref <=14)

## 2012-04-16 LAB — POCT GLYCOSYLATED HEMOGLOBIN (HGB A1C): Hemoglobin A1C: 5.3

## 2012-04-16 MED ORDER — PREDNISONE 20 MG PO TABS: ORAL_TABLET | ORAL | Status: DC

## 2012-04-16 MED ORDER — CYCLOBENZAPRINE HCL 10 MG PO TABS: 10.0000 mg | ORAL_TABLET | Freq: Two times a day (BID) | ORAL | Status: DC | PRN

## 2012-04-16 NOTE — Progress Notes (Signed)
[facility] [address] [place] [address] [phone]  Date:  04/16/2012   Name:  [name]   DOB:  11/10/1950   MRN:  [id number]  PCP:  Default, Provider, MD    Chief Complaint: Back Pain   History of Present Illness:  [name] is a 62 y.o. very pleasant female patient who presents with the following:  She is here today with LBP- it started on Monday and today is thursday.  She twisted and the pain hit her all of a sudden.  She had also recently helped her daughter to move an did a lot of carrying.  The pain runs from her left buttock down her left leg, around the lateal thigh.  Her left foot feels numb.   She tried some flexeril- it did help some with the pain but makes her sleepy.    No saddle anesthesia, no bowel or bladder dysfunction.   Her pain seems to be getting worse, she feels very stiff.   She also tried a valium belonging to her husband- it makes her feel less sedated than the flexeril and helps her to sleep.    Her mother had a history of RA.  She has noted some joint pains at times and wonders if she could have RA.   She has used prednisone in the past with success for this sort of problem- it did not make her glucose get too high.  Her glucose is generally well controlled  Patient Active Problem List  Diagnosis  . Diabetes mellitus, type 2  . HTN (hypertension)    Past Medical History  Diagnosis Date  . Anemia   . Diabetes mellitus without complication   . Hypertension     Past Surgical History  Procedure Date  . Tympanoplasty   . Carotid stent 2009    History  Substance Use Topics  . Smoking status: Never Smoker   . Smokeless tobacco: Not on file  . Alcohol Use: No    Family History  Problem Relation Age of Onset  . Coronary artery disease Mother   . Rheum arthritis Mother   . Heart attack Father   . Hypertension Father   . Hypertension Brother   . Cancer Paternal Grandmother     stomach    No Known  Allergies  Medication list has been reviewed and updated.  Current Outpatient Prescriptions on File Prior to Visit  Medication Sig Dispense Refill  . aspirin 325 MG tablet Take 325 mg by mouth daily.      . cholecalciferol (VITAMIN D) 1000 UNITS tablet Take 1,000 Units by mouth daily.      . clopidogrel (PLAVIX) 75 MG tablet Take 75 mg by mouth daily.      . famotidine (PEPCID) 10 MG tablet Take 10 mg by mouth 2 (two) times daily.      . ferrous gluconate (FERGON) 325 MG tablet Take 325 mg by mouth daily with breakfast.      . hydrochlorothiazide (HYDRODIURIL) 25 MG tablet Take 25 mg by mouth daily.      . meloxicam (MOBIC) 7.5 MG tablet Take 1 tablet (7.5 mg total) by mouth daily.  30 tablet  0  . niacin-simvastatin (SIMCOR) 500-20 MG 24 hr tablet Take 1 tablet by mouth at bedtime.      . pioglitazone-metformin (ACTOPLUS MET) 15-500 MG per tablet Take 1 tablet by mouth 2 (two) times daily with a meal.      .  Potassium Chloride (KLOR-CON PO) Take 20 mEq by mouth daily.      Marland Kitchen telmisartan (MICARDIS) 40 MG tablet Take 40 mg by mouth daily.        Review of Systems:  As per HPI- otherwise negative.   Physical Examination: Filed Vitals:   04/16/12 1203  BP: 146/76  Pulse: 58  Temp: 97.5 F (36.4 C)  Resp: 20   Filed Vitals:   04/16/12 1203  Height: 5\' 2"  (1.575 m)  Weight: 202 lb (91.627 kg)   Body mass index is 36.95 kg/(m^2). Ideal Body Weight: Weight in (lb) to have BMI = 25: 136.4   GEN: WDWN, NAD, Non-toxic, A & O x 3, moving stiffly and in pain HEENT: Atraumatic, Normocephalic. Neck supple. No masses, No LAD. Ears and Nose: No external deformity. CV: RRR, No M/G/R. No JVD. No thrill. No extra heart sounds. PULM: CTA B, no wheezes, crackles, rhonchi. No retractions. No resp. distress. No accessory muscle use EXTR: No c/c/e NEURO Normal gait but is stiff PSYCH: Normally interactive. Conversant. Not depressed or anxious appearing.  Calm demeanor.  Back: left sided  lumbar pain and buttock spasm.  She has negative SLR bilaterally, normal leg sensation but strength of knee flexion limited by pain.   No saddle anesthesia, normal DTR bilaterally.  Results for orders placed in visit on 04/16/12  POCT GLYCOSYLATED HEMOGLOBIN (HGB A1C)      Component Value Range   Hemoglobin A1C 5.3    POCT SEDIMENTATION RATE      Component Value Range   POCT SED RATE 16  0 - 22 mm/hr   Assessment and Plan: 1. Back pain  Rheumatoid factor, cyclobenzaprine (FLEXERIL) 10 MG tablet, POCT SEDIMENTATION RATE, predniSONE (DELTASONE) 20 MG tablet  2. Diabetes mellitus, type 2  POCT glycosylated hemoglobin (Hb A1C), Basic metabolic panel  3. HTN (hypertension)  Basic metabolic panel   She has vicodin at home which she may use as needed.  Refill flexeril today, and will use a short course of prednisone.  She will watch her glucose closely and let me know if her numbers get very high.  Counseled her to use vicodin OR flexeril, not both at the same time.   She is concerned about more frequent joint pains as of late and would like to have basic screening for RA.  Will check a ESR and RF for her today.  Her A1c is excellent.    Abbe Amsterdam, MD

## 2012-04-17 ENCOUNTER — Encounter: Payer: Self-pay | Admitting: Family Medicine

## 2012-04-23 ENCOUNTER — Ambulatory Visit (HOSPITAL_COMMUNITY): Payer: 59

## 2012-04-23 ENCOUNTER — Ambulatory Visit (HOSPITAL_COMMUNITY)
Admission: RE | Admit: 2012-04-23 | Discharge: 2012-04-23 | Disposition: A | Discharge status: Home / Self Care | Payer: 59 | Source: Ambulatory Visit | Attending: Family Medicine | Admitting: Family Medicine

## 2012-04-23 DIAGNOSIS — Z1231 Encounter for screening mammogram for malignant neoplasm of breast: Secondary | ICD-10-CM | POA: Insufficient documentation

## 2012-04-27 ENCOUNTER — Other Ambulatory Visit: Payer: Self-pay | Admitting: Family Medicine

## 2012-04-27 NOTE — Telephone Encounter (Signed)
Please pull paper chart.  

## 2012-05-09 ENCOUNTER — Other Ambulatory Visit: Payer: Self-pay

## 2012-05-11 NOTE — Telephone Encounter (Signed)
Chart pulled to PA pool at nurses station [phone]

## 2012-06-22 ENCOUNTER — Other Ambulatory Visit: Payer: Self-pay | Admitting: Family Medicine

## 2012-10-01 ENCOUNTER — Ambulatory Visit (INDEPENDENT_AMBULATORY_CARE_PROVIDER_SITE_OTHER): Payer: 59 | Admitting: Family Medicine

## 2012-10-01 ENCOUNTER — Observation Stay (HOSPITAL_COMMUNITY)
Admission: EM | Admit: 2012-10-01 | Discharge: 2012-10-02 | Disposition: A | Discharge status: Home / Self Care | Payer: 59 | Attending: Internal Medicine | Admitting: Internal Medicine

## 2012-10-01 ENCOUNTER — Emergency Department (HOSPITAL_COMMUNITY): Payer: 59

## 2012-10-01 VITALS — BP 128/68 | HR 73 | Temp 97.6°F | Resp 16 | Ht 62.0 in | Wt 215.2 lb

## 2012-10-01 DIAGNOSIS — E860 Dehydration: Secondary | ICD-10-CM

## 2012-10-01 DIAGNOSIS — E785 Hyperlipidemia, unspecified: Secondary | ICD-10-CM | POA: Insufficient documentation

## 2012-10-01 DIAGNOSIS — I10 Essential (primary) hypertension: Secondary | ICD-10-CM | POA: Insufficient documentation

## 2012-10-01 DIAGNOSIS — R0789 Other chest pain: Principal | ICD-10-CM | POA: Insufficient documentation

## 2012-10-01 DIAGNOSIS — Z79899 Other long term (current) drug therapy: Secondary | ICD-10-CM | POA: Insufficient documentation

## 2012-10-01 DIAGNOSIS — E1169 Type 2 diabetes mellitus with other specified complication: Secondary | ICD-10-CM | POA: Diagnosis present

## 2012-10-01 DIAGNOSIS — I2510 Atherosclerotic heart disease of native coronary artery without angina pectoris: Secondary | ICD-10-CM | POA: Diagnosis present

## 2012-10-01 DIAGNOSIS — E119 Type 2 diabetes mellitus without complications: Secondary | ICD-10-CM | POA: Diagnosis present

## 2012-10-01 DIAGNOSIS — R232 Flushing: Secondary | ICD-10-CM | POA: Insufficient documentation

## 2012-10-01 DIAGNOSIS — E1159 Type 2 diabetes mellitus with other circulatory complications: Secondary | ICD-10-CM | POA: Diagnosis present

## 2012-10-01 DIAGNOSIS — Z9861 Coronary angioplasty status: Secondary | ICD-10-CM | POA: Insufficient documentation

## 2012-10-01 DIAGNOSIS — E669 Obesity, unspecified: Secondary | ICD-10-CM | POA: Insufficient documentation

## 2012-10-01 DIAGNOSIS — R079 Chest pain, unspecified: Secondary | ICD-10-CM | POA: Diagnosis present

## 2012-10-01 HISTORY — DX: Atherosclerotic heart disease of native coronary artery without angina pectoris: I25.10

## 2012-10-01 HISTORY — DX: Hyperlipidemia, unspecified: E78.5

## 2012-10-01 LAB — CBC
Hemoglobin: 11.7 g/dL — ABNORMAL LOW (ref 12.0–15.0)
Platelets: 294 10*3/uL (ref 150–400)
RBC: 4.43 MIL/uL (ref 3.87–5.11)
WBC: 8.6 10*3/uL (ref 4.0–10.5)

## 2012-10-01 LAB — URINALYSIS, ROUTINE W REFLEX MICROSCOPIC
Glucose, UA: NEGATIVE mg/dL
Ketones, ur: NEGATIVE mg/dL
Nitrite: NEGATIVE
Specific Gravity, Urine: 1.008 (ref 1.005–1.030)
pH: 6 (ref 5.0–8.0)

## 2012-10-01 LAB — COMPREHENSIVE METABOLIC PANEL
ALT: 14 U/L (ref 0–35)
AST: 16 U/L (ref 0–37)
Alkaline Phosphatase: 99 U/L (ref 39–117)
CO2: 27 mEq/L (ref 19–32)
GFR calc Af Amer: >90 mL/min (ref >90)
GFR calc non Af Amer: >90 mL/min (ref >90)
Glucose, Bld: 107 mg/dL — ABNORMAL HIGH (ref 70–99)
Potassium: 4.4 mEq/L (ref 3.5–5.1)
Sodium: 140 mEq/L (ref 135–145)

## 2012-10-01 LAB — URINE MICROSCOPIC-ADD ON

## 2012-10-01 LAB — POCT I-STAT TROPONIN I

## 2012-10-01 MED ORDER — NITROGLYCERIN 0.4 MG SL SUBL
0.4000 mg | SUBLINGUAL_TABLET | SUBLINGUAL | Status: DC | PRN
  Administered 2012-10-01: 0.4 mg via SUBLINGUAL
  Filled 2012-10-01: qty 25

## 2012-10-01 NOTE — Progress Notes (Signed)
Subjective: 62 year old lady who is here complaining of itching and burning since about 11:00 this morning. She could not remember whether she had taken her aspirin before taking the niacin-containing Simvacor.  She later took 3 baby aspirin. She had just the burning and itching sensation all over her in the afternoon. She also had a heaviness pressure sensation in her chest. She does have a history of coronary artery disease and has had stents about 4 years ago. She saw her cardiologist several weeks ago. She has history of diabetes, but does not check her sugars. Sugars have usually been good apparently.  Objective: Slightly anxious female in no major distress. Her skin looks normal with no rashes. Chest is clear to auscultation. Heart regular without murmurs.  Assessment: Itching and burning, consistent with niacin symptoms Chest pressure History of coronary disease History of diabetes mellitus  Plan: Check an EKG on her  EKG is normal  Patient continues to complain of pressure in her chest. I have told her that we need to get this checked out further and will send her by EMS to the emergency room.  We will give her oxygen, aspirin, O2, and begin IV

## 2012-10-01 NOTE — ED Provider Notes (Signed)
History    CSN: [id number] Arrival date & time 10/01/12  2207  First MD Initiated Contact with Patient 10/01/12 2231     Chief Complaint  Patient presents with  . Chest Pain   (Consider location/radiation/quality/duration/timing/severity/associated sxs/prior Treatment) HPI History provided by pt.   Pt has been on Simcor for several years and it occasionally causes an episode of severe flushing and nausea.  Had a similar episode at work today, but it did not resolve w/ cool clothes like nml.  She went to [facility] for evaluation, realized at that time that she was experiencing pressure in the center of her chest, and was transferred to ED for further evaluation.  She is unsure when the CP started but it has been intermittent all evening.  Non-radiating, non-exertional, non-positional, non-pleuritic.  No associated fever, cough, SOB, abd pain.  Flushing and nausea have resolved (lasted ~1hr).  Pt has a stent to LAD.  She does not typically experience angina and coronary disease was discovered incidentally.  Past Medical History  Diagnosis Date  . Anemia   . Diabetes mellitus without complication   . Hypertension    Past Surgical History  Procedure Laterality Date  . Tympanoplasty    . Carotid stent  2009   Family History  Problem Relation Age of Onset  . Coronary artery disease Mother   . Rheum arthritis Mother   . Heart attack Father   . Hypertension Father   . Hypertension Brother   . Cancer Paternal Grandmother     stomach   History  Substance Use Topics  . Smoking status: Never Smoker   . Smokeless tobacco: Not on file  . Alcohol Use: No   OB History   Grav Para Term Preterm Abortions TAB SAB Ect Mult Living                 Review of Systems  All other systems reviewed and are negative.    Allergies  Review of patient's allergies indicates no known allergies.  Home Medications   Current Outpatient Rx  Name  Route  Sig  Dispense  Refill  . aspirin EC 81 MG  tablet   Oral   Take 81 mg by mouth daily.         . cholecalciferol (VITAMIN D) 1000 UNITS tablet   Oral   Take 1,000 Units by mouth daily.         . clopidogrel (PLAVIX) 75 MG tablet   Oral   Take 75 mg by mouth daily.         . famotidine (PEPCID) 10 MG tablet   Oral   Take 10 mg by mouth 2 (two) times daily.         . ferrous sulfate 325 (65 FE) MG tablet   Oral   Take 325 mg by mouth daily with breakfast.         . hydrochlorothiazide (HYDRODIURIL) 25 MG tablet   Oral   Take 25 mg by mouth daily.         . Niacin-Simvastatin [facility]) 500-40 MG TB24   Oral   Take 1 tablet by mouth every morning.         . pioglitazone-metformin (ACTOPLUS MET) 15-500 MG per tablet   Oral   Take 1 tablet by mouth 2 (two) times daily with a meal.         . Potassium Chloride (KLOR-CON PO)   Oral   Take 20 mEq by mouth daily.         Marland Kitchen  potassium chloride SA (K-DUR,KLOR-CON) 20 MEQ tablet   Oral   Take 20 mEq by mouth daily.         Marland Kitchen telmisartan (MICARDIS) 40 MG tablet   Oral   Take 40 mg by mouth daily.          BP 165/60  Pulse 76  Temp(Src) 97.4 F (36.3 C) (Oral)  Resp 17  SpO2 99% Physical Exam  Nursing note and vitals reviewed. Constitutional: She is oriented to person, place, and time. She appears well-developed and well-nourished. No distress.  HENT:  Head: Normocephalic and atraumatic.  Eyes:  Normal appearance  Neck: Normal range of motion.  Cardiovascular: Normal rate, regular rhythm and intact distal pulses.   Pulmonary/Chest: Effort normal and breath sounds normal. No respiratory distress. She exhibits no tenderness.  Abdominal: Soft. Bowel sounds are normal. She exhibits no distension. There is no tenderness.  Musculoskeletal: Normal range of motion.  No peripheral edema or calf ttp  Neurological: She is alert and oriented to person, place, and time.  Skin: Skin is warm and dry. No rash noted.  Psychiatric: She has a normal mood and  affect. Her behavior is normal.    ED Course  Procedures (including critical care time)  Date: 10/02/2012  Rate: 78  Rhythm: normal sinus rhythm  QRS Axis: normal  Intervals: normal  ST/T Wave abnormalities: normal  Conduction Disutrbances:nonspecific intraventricular conduction delay  Narrative Interpretation:   Old EKG Reviewed: unchanged   Labs Reviewed  CBC - Abnormal; Notable for the following:    Hemoglobin 11.7 (*)    HCT 35.4 (*)    All other components within normal limits  COMPREHENSIVE METABOLIC PANEL - Abnormal; Notable for the following:    Glucose, Bld 107 (*)    Albumin 3.4 (*)    All other components within normal limits  PROTIME-INR  APTT  URINALYSIS, ROUTINE W REFLEX MICROSCOPIC  POCT I-STAT TROPONIN I   Dg Chest Portable 1 View  10/01/2012   *RADIOLOGY REPORT*  Clinical Data: Chest pain.  Dizziness.  Nausea.  PORTABLE CHEST - 1 VIEW  Comparison: 10/06/2008  Findings: Shallow inspiration.  Heart size and pulmonary vascularity are normal for technique.  There is obscuration of the left costophrenic angle which is probably due to soft tissue attenuation.  No definite effusion.  No pneumothorax.  No focal consolidation or airspace disease.  Mediastinal contours appear intact.  IMPRESSION: Shallow inspiration.  No evidence of active pulmonary disease.   Original Report Authenticated By: Burman Nieves, M.D.   1. Chest pain   2. Diabetes mellitus, type 2   3. HTN (hypertension)     MDM  61yo F w/ stent to LAD presents w/ chest pressure.   Mild pain upon arrival that resolved w/ ntg.  EKG non-ischemic and initial troponin neg.  Labs otherwise unremarkable and CXR non-acute.   Will admit to [facility] for r/o ACS.  12:35 AM   Otilio Miu, PA-C 10/02/12 [phone]

## 2012-10-01 NOTE — ED Notes (Signed)
Pt from [facility] via [facility]. Pt presents with CP and nausea similar to the CP she had with stent placement in 2010. Pt describes CP as 5/10, pressure with radiation back. Pt took 81mg  ASA at 1130 this morning with onset of pain with no relief. Pt given 2SL nitro and 4mg  zofran IV, 424ASA PO PTA with minimal relief.

## 2012-10-02 ENCOUNTER — Encounter (HOSPITAL_COMMUNITY)
Admission: EM | Disposition: A | Discharge status: Home / Self Care | Payer: Self-pay | Source: Home / Self Care | Attending: Emergency Medicine

## 2012-10-02 ENCOUNTER — Encounter (HOSPITAL_COMMUNITY): Payer: Self-pay | Admitting: *Deleted

## 2012-10-02 DIAGNOSIS — R079 Chest pain, unspecified: Secondary | ICD-10-CM | POA: Diagnosis present

## 2012-10-02 DIAGNOSIS — E119 Type 2 diabetes mellitus without complications: Secondary | ICD-10-CM

## 2012-10-02 DIAGNOSIS — I10 Essential (primary) hypertension: Secondary | ICD-10-CM | POA: Insufficient documentation

## 2012-10-02 DIAGNOSIS — I2510 Atherosclerotic heart disease of native coronary artery without angina pectoris: Secondary | ICD-10-CM | POA: Diagnosis present

## 2012-10-02 DIAGNOSIS — E785 Hyperlipidemia, unspecified: Secondary | ICD-10-CM

## 2012-10-02 DIAGNOSIS — E1169 Type 2 diabetes mellitus with other specified complication: Secondary | ICD-10-CM | POA: Diagnosis present

## 2012-10-02 HISTORY — PX: LEFT HEART CATHETERIZATION WITH CORONARY ANGIOGRAM: SHX5451

## 2012-10-02 LAB — D-DIMER, QUANTITATIVE: D-Dimer, Quant: 0.48 ug/mL-FEU (ref 0.00–0.48)

## 2012-10-02 LAB — LIPID PANEL
HDL: 42 mg/dL (ref >39)
LDL Cholesterol: 112 mg/dL — ABNORMAL HIGH (ref 0–99)
Triglycerides: 133 mg/dL (ref <150)

## 2012-10-02 LAB — TROPONIN I
Troponin I: <0.3 ng/mL (ref <0.30)
Troponin I: <0.3 ng/mL (ref <0.30)

## 2012-10-02 LAB — POCT I-STAT TROPONIN I

## 2012-10-02 LAB — GLUCOSE, CAPILLARY
Glucose-Capillary: 98 mg/dL (ref 70–99)
Glucose-Capillary: 87 mg/dL (ref 70–99)
Glucose-Capillary: 152 mg/dL — ABNORMAL HIGH (ref 70–99)

## 2012-10-02 LAB — HEMOGLOBIN A1C: Mean Plasma Glucose: 114 mg/dL (ref <117)

## 2012-10-02 LAB — PLATELET INHIBITION P2Y12: Platelet Function  P2Y12: 260 [PRU] (ref 194–418)

## 2012-10-02 LAB — TSH: TSH: 1.948 u[IU]/mL (ref 0.350–4.500)

## 2012-10-02 SURGERY — LEFT HEART CATHETERIZATION WITH CORONARY ANGIOGRAM
Anesthesia: LOCAL

## 2012-10-02 MED ORDER — LIDOCAINE HCL (PF) 1 % IJ SOLN
INTRAMUSCULAR | Status: AC
  Filled 2012-10-02: qty 30

## 2012-10-02 MED ORDER — DIAZEPAM 2 MG PO TABS
2.0000 mg | ORAL_TABLET | Freq: Once | ORAL | Status: AC
Start: 2012-10-02 — End: 2012-10-02
  Administered 2012-10-02: 2 mg via ORAL
  Filled 2012-10-02: qty 1

## 2012-10-02 MED ORDER — VERAPAMIL HCL 2.5 MG/ML IV SOLN
INTRAVENOUS | Status: AC
  Filled 2012-10-02: qty 2

## 2012-10-02 MED ORDER — SODIUM CHLORIDE 0.9 % IV SOLN: 250.0000 mL | INTRAVENOUS | Status: DC

## 2012-10-02 MED ORDER — SIMVASTATIN 40 MG PO TABS
40.0000 mg | ORAL_TABLET | Freq: Every day | ORAL | Status: DC
  Filled 2012-10-02: qty 1

## 2012-10-02 MED ORDER — CLOPIDOGREL BISULFATE 75 MG PO TABS
75.0000 mg | ORAL_TABLET | Freq: Every day | ORAL | Status: DC
  Administered 2012-10-02: 75 mg via ORAL
  Filled 2012-10-02: qty 1

## 2012-10-02 MED ORDER — ACETAMINOPHEN 325 MG PO TABS
650.0000 mg | ORAL_TABLET | Freq: Four times a day (QID) | ORAL | Status: DC | PRN
  Administered 2012-10-02: 650 mg via ORAL
  Filled 2012-10-02: qty 2

## 2012-10-02 MED ORDER — INSULIN ASPART 100 UNIT/ML ~~LOC~~ SOLN: 0.0000 [IU] | SUBCUTANEOUS | Status: DC

## 2012-10-02 MED ORDER — MORPHINE SULFATE 2 MG/ML IJ SOLN: 2.0000 mg | INTRAMUSCULAR | Status: DC | PRN

## 2012-10-02 MED ORDER — POTASSIUM CHLORIDE CRYS ER 20 MEQ PO TBCR
20.0000 meq | EXTENDED_RELEASE_TABLET | Freq: Every day | ORAL | Status: DC
  Administered 2012-10-02: 20 meq via ORAL
  Filled 2012-10-02: qty 1

## 2012-10-02 MED ORDER — IRBESARTAN 150 MG PO TABS
150.0000 mg | ORAL_TABLET | Freq: Every day | ORAL | Status: DC
  Filled 2012-10-02: qty 1

## 2012-10-02 MED ORDER — DIAZEPAM 5 MG PO TABS
5.0000 mg | ORAL_TABLET | ORAL | Status: AC
  Administered 2012-10-02: 5 mg via ORAL
  Filled 2012-10-02: qty 1

## 2012-10-02 MED ORDER — VITAMIN D3 25 MCG (1000 UNIT) PO TABS
1000.0000 [IU] | ORAL_TABLET | Freq: Every day | ORAL | Status: DC
  Filled 2012-10-02: qty 1

## 2012-10-02 MED ORDER — SODIUM CHLORIDE 0.9 % IV SOLN
INTRAVENOUS | Status: DC
  Administered 2012-10-02: 08:00:00 via INTRAVENOUS

## 2012-10-02 MED ORDER — SODIUM CHLORIDE 0.9 % IJ SOLN: 3.0000 mL | Freq: Two times a day (BID) | INTRAMUSCULAR | Status: DC

## 2012-10-02 MED ORDER — ONDANSETRON HCL 4 MG/2ML IJ SOLN: 4.0000 mg | Freq: Four times a day (QID) | INTRAMUSCULAR | Status: DC | PRN

## 2012-10-02 MED ORDER — HYDROCHLOROTHIAZIDE 25 MG PO TABS
25.0000 mg | ORAL_TABLET | Freq: Every day | ORAL | Status: DC
  Filled 2012-10-02: qty 1

## 2012-10-02 MED ORDER — POTASSIUM CHLORIDE 20 MEQ PO PACK: 20.0000 meq | PACK | Freq: Every day | ORAL | Status: DC

## 2012-10-02 MED ORDER — ACETAMINOPHEN 325 MG PO TABS
650.0000 mg | ORAL_TABLET | ORAL | Status: DC | PRN
  Administered 2012-10-02: 18:00:00 650 mg via ORAL
  Filled 2012-10-02: qty 2

## 2012-10-02 MED ORDER — SIMVASTATIN 40 MG PO TABS: 40.0000 mg | ORAL_TABLET | Freq: Every day | ORAL | Status: DC

## 2012-10-02 MED ORDER — HEPARIN SODIUM (PORCINE) 1000 UNIT/ML IJ SOLN
INTRAMUSCULAR | Status: AC
  Filled 2012-10-02: qty 1

## 2012-10-02 MED ORDER — HEPARIN (PORCINE) IN NACL 2-0.9 UNIT/ML-% IJ SOLN
INTRAMUSCULAR | Status: AC
  Filled 2012-10-02: qty 1000

## 2012-10-02 MED ORDER — FERROUS SULFATE 325 (65 FE) MG PO TABS
325.0000 mg | ORAL_TABLET | Freq: Every day | ORAL | Status: DC
  Filled 2012-10-02 (×2): qty 1

## 2012-10-02 MED ORDER — NITROGLYCERIN 0.2 MG/ML ON CALL CATH LAB
INTRAVENOUS | Status: AC
  Filled 2012-10-02: qty 1

## 2012-10-02 MED ORDER — ASPIRIN EC 81 MG PO TBEC
81.0000 mg | DELAYED_RELEASE_TABLET | Freq: Every day | ORAL | Status: DC
  Administered 2012-10-02: 81 mg via ORAL
  Filled 2012-10-02: qty 1

## 2012-10-02 MED ORDER — SODIUM CHLORIDE 0.9 % IV SOLN: INTRAVENOUS | Status: AC

## 2012-10-02 MED ORDER — ASPIRIN 81 MG PO CHEW: 81.0000 mg | CHEWABLE_TABLET | Freq: Every day | ORAL | Status: DC

## 2012-10-02 MED ORDER — FAMOTIDINE 10 MG PO TABS
10.0000 mg | ORAL_TABLET | Freq: Two times a day (BID) | ORAL | Status: DC
  Filled 2012-10-02 (×2): qty 1

## 2012-10-02 MED ORDER — ASPIRIN 81 MG PO CHEW
243.0000 mg | CHEWABLE_TABLET | ORAL | Status: AC
  Administered 2012-10-02: 243 mg via ORAL
  Filled 2012-10-02: qty 3

## 2012-10-02 MED ORDER — SODIUM CHLORIDE 0.9 % IJ SOLN: 3.0000 mL | INTRAMUSCULAR | Status: DC | PRN

## 2012-10-02 NOTE — Consult Note (Signed)
Reason for Consult: chest pressure  Referring Physician: Dr. Nichola Sizer Not In System Primary Cardiologist:Dr. Hilty  [name] is an 62 y.o. female.    Chief Complaint: admitted 10/02/12 at 0534 with chest pressure.    HPI: [name] is a 62 y.o. female who presents to the ED with c/o chest pressure.  Discomfort began at work and associated with flushing symptoms secondary to niacin.  No DOE, SOB, nausea or diaphoresis.    She then developed chest pressure and nausea and went to Urgent care.  She thought the flushing was secondary to stopping ASA.  She was given ASA, cooled down and then had chest presssure.   Sent to [facility] ER and NTG SL did resolve the pressure.  She was very concerned about the pressure because it was the same that she had with her Lexiscan in 2010 that lead to her stent.   Currently she is pain free and has been NPO.  History of CAD with DES stent to prox. LAD in 2010 with residual disease in LCX 10-20% and RCA 20% lesion. Ischemia, leading to the cath,  was found on stress test for clearance for spine surgery.  She also has HTN, DM, dyslipidemia and obesity.   Troponin is neg.    EKG without acute changes.   Past Medical History  Diagnosis Date  . Anemia   . Diabetes mellitus without complication   . Hypertension   . CAD (coronary artery disease)     PCI to LAD in 2010, mild residual disease in LCX and RCA  . Hyperlipidemia LDL goal < 70     Past Surgical History  Procedure Laterality Date  . Tympanoplasty    . Carotid stent  2009  . Coronary angioplasty with stent placement  2010    Stent DES, Xience to prox. LAD    Family History  Problem Relation Age of Onset  . Coronary artery disease Mother   . Rheum arthritis Mother   . Heart attack Father   . Hypertension Father   . Hypertension Brother   . Cancer Paternal Grandmother     stomach   Social History:  reports that she has never smoked. She does not have any smokeless tobacco history on  file. She reports that she does not drink alcohol or use illicit drugs.  Allergies: No Known Allergies  Medications Prior to Admission  Medication Sig Dispense Refill  . aspirin EC 81 MG tablet Take 81 mg by mouth daily.      . cholecalciferol (VITAMIN D) 1000 UNITS tablet Take 1,000 Units by mouth daily.      . clopidogrel (PLAVIX) 75 MG tablet Take 75 mg by mouth daily.      . famotidine (PEPCID) 10 MG tablet Take 10 mg by mouth 2 (two) times daily.      . ferrous sulfate 325 (65 FE) MG tablet Take 325 mg by mouth daily with breakfast.      . hydrochlorothiazide (HYDRODIURIL) 25 MG tablet Take 25 mg by mouth daily.      . pioglitazone-metformin (ACTOPLUS MET) 15-500 MG per tablet Take 1 tablet by mouth 2 (two) times daily with a meal.      . Potassium Chloride (KLOR-CON PO) Take 20 mEq by mouth daily.      . potassium chloride SA (K-DUR,KLOR-CON) 20 MEQ tablet Take 20 mEq by mouth daily.      Marland Kitchen telmisartan (MICARDIS) 40 MG tablet Take 40 mg  by mouth daily.        Results for orders placed during the hospital encounter of 10/01/12 (from the past 48 hour(s))  CBC     Status: Abnormal   Collection Time    10/01/12 10:42 PM      Result Value Range   WBC 8.6  4.0 - 10.5 K/uL   RBC 4.43  3.87 - 5.11 MIL/uL   Hemoglobin 11.7 (*) 12.0 - 15.0 g/dL   HCT 16.1 (*) 09.6 - 04.5 %   MCV 79.9  78.0 - 100.0 fL   MCH 26.4  26.0 - 34.0 pg   MCHC 33.1  30.0 - 36.0 g/dL   RDW 40.9  81.1 - 91.4 %   Platelets 294  150 - 400 K/uL  COMPREHENSIVE METABOLIC PANEL     Status: Abnormal   Collection Time    10/01/12 10:42 PM      Result Value Range   Sodium 140  135 - 145 mEq/L   Potassium 4.4  3.5 - 5.1 mEq/L   Chloride 105  96 - 112 mEq/L   CO2 27  19 - 32 mEq/L   Glucose, Bld 107 (*) 70 - 99 mg/dL   BUN 18  6 - 23 mg/dL   Creatinine, Ser 7.82  0.50 - 1.10 mg/dL   Calcium 9.0  8.4 - 95.6 mg/dL   Total Protein 6.1  6.0 - 8.3 g/dL   Albumin 3.4 (*) 3.5 - 5.2 g/dL   AST 16  0 - 37 U/L   ALT 14  0  - 35 U/L   Alkaline Phosphatase 99  39 - 117 U/L   Total Bilirubin 0.3  0.3 - 1.2 mg/dL   GFR calc non Af Amer >90  >90 mL/min   GFR calc Af Amer >90  >90 mL/min   Comment:            The eGFR has been calculated     using the CKD EPI equation.     This calculation has not been     validated in all clinical     situations.     eGFR's persistently     <90 mL/min signify     possible Chronic Kidney Disease.  PROTIME-INR     Status: None   Collection Time    10/01/12 10:42 PM      Result Value Range   Prothrombin Time 12.8  11.6 - 15.2 seconds   INR 0.98  0.00 - 1.49  APTT     Status: None   Collection Time    10/01/12 10:42 PM      Result Value Range   aPTT 30  24 - 37 seconds  POCT I-STAT TROPONIN I     Status: None   Collection Time    10/01/12 10:53 PM      Result Value Range   Troponin i, poc 0.01  0.00 - 0.08 ng/mL   Comment 3            Comment: Due to the release kinetics of cTnI,     a negative result within the first hours     of the onset of symptoms does not rule out     myocardial infarction with certainty.     If myocardial infarction is still suspected,     repeat the test at appropriate intervals.  URINALYSIS, ROUTINE W REFLEX MICROSCOPIC     Status: Abnormal   Collection Time    10/01/12 11:05 PM  Result Value Range   Color, Urine YELLOW  YELLOW   APPearance CLOUDY (*) CLEAR   Specific Gravity, Urine 1.008  1.005 - 1.030   pH 6.0  5.0 - 8.0   Glucose, UA NEGATIVE  NEGATIVE mg/dL   Hgb urine dipstick NEGATIVE  NEGATIVE   Bilirubin Urine NEGATIVE  NEGATIVE   Ketones, ur NEGATIVE  NEGATIVE mg/dL   Protein, ur NEGATIVE  NEGATIVE mg/dL   Urobilinogen, UA 0.2  0.0 - 1.0 mg/dL   Nitrite NEGATIVE  NEGATIVE   Leukocytes, UA MODERATE (*) NEGATIVE  URINE MICROSCOPIC-ADD ON     Status: None   Collection Time    10/01/12 11:05 PM      Result Value Range   Squamous Epithelial / LPF RARE  RARE   WBC, UA 11-20  <3 WBC/hpf   Bacteria, UA RARE  RARE  POCT  I-STAT TROPONIN I     Status: None   Collection Time    10/02/12  1:16 AM      Result Value Range   Troponin i, poc 0.01  0.00 - 0.08 ng/mL   Comment 3            Comment: Due to the release kinetics of cTnI,     a negative result within the first hours     of the onset of symptoms does not rule out     myocardial infarction with certainty.     If myocardial infarction is still suspected,     repeat the test at appropriate intervals.  TROPONIN I     Status: None   Collection Time    10/02/12  5:35 AM      Result Value Range   Troponin I <0.30  <0.30 ng/mL   Comment:            Due to the release kinetics of cTnI,     a negative result within the first hours     of the onset of symptoms does not rule out     myocardial infarction with certainty.     If myocardial infarction is still suspected,     repeat the test at appropriate intervals.  GLUCOSE, CAPILLARY     Status: Abnormal   Collection Time    10/02/12  7:28 AM      Result Value Range   Glucose-Capillary 110 (*) 70 - 99 mg/dL   Dg Chest Portable 1 View  10/01/2012   *RADIOLOGY REPORT*  Clinical Data: Chest pain.  Dizziness.  Nausea.  PORTABLE CHEST - 1 VIEW  Comparison: 10/06/2008  Findings: Shallow inspiration.  Heart size and pulmonary vascularity are normal for technique.  There is obscuration of the left costophrenic angle which is probably due to soft tissue attenuation.  No definite effusion.  No pneumothorax.  No focal consolidation or airspace disease.  Mediastinal contours appear intact.  IMPRESSION: Shallow inspiration.  No evidence of active pulmonary disease.   Original Report Authenticated By: Burman Nieves, M.D.    ROS: General:no colds or fevers, no weight changes Skin:no rashes or ulcers, increased flushing with niacin.   HEENT:no blurred vision, no congestion- had been off ASA for sclera bleed. CV:see HPI PUL:see HPI GI:no diarrhea constipation or melena, no indigestion GU:no hematuria, no  dysuria MS:no joint pain, no claudication Neuro:no syncope, no lightheadedness Endo:+ diabetes, no thyroid disease   Blood pressure 115/73, pulse 60, temperature 97.9 F (36.6 C), temperature source Oral, resp. rate 18, height 5\' 1"  (1.549 m), weight 215  lb (97.523 kg), SpO2 99.00%. PE: General:Pleasant affect, NAD Skin:Warm and dry, brisk capillary refill HEENT:normocephalic, sclera clear, mucus membranes moist Neck:supple, no JVD, no bruits  Heart:S1S2 RRR without murmur, gallup, rub or click Lungs:clear without rales, rhonchi, or wheezes BJY:NWGN, non tender, + BS, do not palpate liver spleen or masses Ext:no lower ext edema, 2+ pedal pulses, 2+ radial pulses Neuro:alert and oriented, MAE, follows commands, + facial symmetry    Assessment/Plan Principal Problem:   Chest pain Active Problems:   CAD (coronary artery disease)PCI to LAD in 2010, mild residual disease in LCX and RCA   Diabetes mellitus, type 2   HTN (hypertension)   Hyperlipidemia LDL goal < 70  PLAN: Dr. Allyson Sabal saw and examined the pt.  With similar symptoms that she had with Lexiscan and we know that was due to stenosis and ischemia- we will proceed with cardiac cath today.   Continue Plavix and ASA. Stop Niacin.     Leone Brand  Nurse Practitioner Certified [facility] Pager [phone] 10/02/2012, 11:02 AM     Agree with note written by Nada Boozer RNP  Pt with H/O CAD s/p  Cath/PCI of LAD with Xience DES 4 years ago. She had residual LCX and RCA dz. + CRF. Developed CP similar to pain during prior pharmacologic MPI relieved with sl NTG X 3. Currently pain free. Exam benign. Labs OK. Enz neg. EKG w/o acute changes. Given mod CAD 4 years ago with CP that was nitrate responsive I favor cor angio done radially to definitively r/o an ischemic etiology. Pt and family are agreeable.  Runell Gess 10/02/2012 12:05 PM

## 2012-10-02 NOTE — H&P (Signed)
   Pt was reexamined and existing H & P reviewed. No changes found.  Runell Gess, MD [facility] 10/02/2012 5:02 PM

## 2012-10-02 NOTE — Progress Notes (Signed)
MD notified of pt's bp being 98/25. Awaiting new orders. Will continue to monitor the pt. Sanda Linger, RN

## 2012-10-02 NOTE — Progress Notes (Signed)
Pt's bp rechecked manually 102/50. MD on call notified. Will continue to monitor the pt. [name]

## 2012-10-02 NOTE — Discharge Summary (Signed)
Physician Discharge Summary  [name] YQM:[id number] DOB: 1950-04-09 DOA: 10/01/2012  PCP: Pcp Not In System  Admit date: 10/01/2012 Discharge date: 10/02/2012  Recommendations for Outpatient Follow-up:  1. Pt will need to follow up with PCP in 1 weeks post discharge 2. Please check the patient's renal function and electrolytes 3. To follow up on the patient's lipids as the niacin portion of her lipid therapy was discontinued. The patient will be continued on Zocor monotherapy only.  Discharge Diagnoses:  Principal Problem:   Chest pain Active Problems:   Diabetes mellitus, type 2   HTN (hypertension)   CAD (coronary artery disease)PCI to LAD in 2010, mild residual disease in LCX and RCA   Hyperlipidemia LDL goal < 70 Atypical chest pain  -Patient has history of established CAD  -Status post PCI to the LAD 10/12/2008  -Cardiology was consulted -Dr. Erlene Quan saw the patient -The patient underwent heart catheterization which revealed that her previous stent was patent without any other obstructive lesions -Cardiology cleared the patient for discharge after the heart catheterization after patient received 2 hrs IVF -Continue aspirin/Plavix  -I-stat Troponins neg x 2, serum troponin neg x 1  -EKG without any ST-T wave change  Hypertension  -Hold HCTZ and ARB due to soft blood pressure--may have been due to 4 nitroglycerin SL last night  -fluid hydration  -Patient instructed to restart her antihypertensive regimen as her blood pressure recovered nicely after the heart catheterization Hyperlipidemia  -Hold niacin as the patient has been having intolerance to flushing  -Continue Zocor monotherapy -A separate prescription for Zocor was sent to the patient's pharmacy of choice -The patient will continue Zocor but her niacin will be discontinued at the time of discharge. She will need a followup with her primary care physician regarding her niacin Diabetes mellitus type 2  -Hold  metformin and by mouth pioglitazone while the patient is in the hospital. These will be restarted at the time of discharge -Novolog SS is in the hospital  -04/16/12 hemoglobin A1c 5.3 -The patient was instructed not to restart her metformin until 10/05/2012   Discharge Condition: Stable  Disposition:  home  Diet: heart healthy Wt Readings from Last 3 Encounters:  10/02/12 97.523 kg (215 lb)  10/02/12 97.523 kg (215 lb)  10/01/12 97.614 kg (215 lb 3.2 oz)    History of present illness:  62 y.o. female who presents to the ED with c/o chest pressure. Discomfort began at work and associated with flushing symptoms secondary to niacin. No DOE, SOB, nausea or diaphoresis. She then developed chest pressure and nausea and went to Urgent care. She thought the flushing was secondary to stopping ASA. She was given ASA, cooled down and then had chest presssure. Sent to [facility] ER and NTG SL did resolve the pressure. She was very concerned about the pressure because it was the same that she had with her Lexiscan in 2010 that lead to her stent. Currently she is pain free and has been NPO.  History of CAD with DES stent to prox. LAD in 2010 with residual disease in LCX 10-20% and RCA 20% lesion. Ischemia, leading to the cath, was found on stress test for clearance for spine surgery. She also has HTN, DM, dyslipidemia and obesity.  Troponin is neg. EKG without acute changes    Consultants: Cardiology--[facility]  Discharge Exam: Filed Vitals:   10/02/12 1900  BP: 145/32  Pulse: 74  Temp: 98 F (36.7 C)  Resp: 18   Filed Vitals:  10/02/12 0958 10/02/12 1343 10/02/12 1637 10/02/12 1900  BP: 115/73 105/67  145/32  Pulse: 60 66 64 74  Temp:  97.9 F (36.6 C)  98 F (36.7 C)  TempSrc:  Oral  Oral  Resp:  17  18  Height:      Weight:      SpO2:  97%  97%   General: A&O x 3, NAD, pleasant, cooperative Cardiovascular: RRR, no rub, no gallop, no S3 Respiratory: CTAB, no wheeze, no  rhonchi Abdomen:soft, nontender, nondistended, positive bowel sounds Extremities: No edema, No lymphangitis, no petechiae  Discharge Instructions      Discharge Orders   Future Orders Complete By Expires     Diet - low sodium heart healthy  As directed     Discharge instructions  As directed     Comments:      Do not restart metformin until 10/05/12 Do not take niacin-simvastatin combination pill    Increase activity slowly  As directed         Medication List    STOP taking these medications       KLOR-CON PO     pioglitazone-metformin 15-500 MG per tablet  Commonly known as:  ACTOPLUS MET     SIMCOR 500-40 MG Tb24  Generic drug:  Niacin-Simvastatin      TAKE these medications       aspirin EC 81 MG tablet  Take 81 mg by mouth daily.     cholecalciferol 1000 UNITS tablet  Commonly known as:  VITAMIN D  Take 1,000 Units by mouth daily.     clopidogrel 75 MG tablet  Commonly known as:  PLAVIX  Take 75 mg by mouth daily.     famotidine 10 MG tablet  Commonly known as:  PEPCID  Take 10 mg by mouth 2 (two) times daily.     ferrous sulfate 325 (65 FE) MG tablet  Take 325 mg by mouth daily with breakfast.     hydrochlorothiazide 25 MG tablet  Commonly known as:  HYDRODIURIL  Take 25 mg by mouth daily.     potassium chloride SA 20 MEQ tablet  Commonly known as:  K-DUR,KLOR-CON  Take 20 mEq by mouth daily.     simvastatin 40 MG tablet  Commonly known as:  ZOCOR  Take 1 tablet (40 mg total) by mouth daily at 6 PM.     telmisartan 40 MG tablet  Commonly known as:  MICARDIS  Take 40 mg by mouth daily.         The results of significant diagnostics from this hospitalization (including imaging, microbiology, ancillary and laboratory) are listed below for reference.    Significant Diagnostic Studies: Dg Chest Portable 1 View  10/01/2012   *RADIOLOGY REPORT*  Clinical Data: Chest pain.  Dizziness.  Nausea.  PORTABLE CHEST - 1 VIEW  Comparison: 10/06/2008   Findings: Shallow inspiration.  Heart size and pulmonary vascularity are normal for technique.  There is obscuration of the left costophrenic angle which is probably due to soft tissue attenuation.  No definite effusion.  No pneumothorax.  No focal consolidation or airspace disease.  Mediastinal contours appear intact.  IMPRESSION: Shallow inspiration.  No evidence of active pulmonary disease.   Original Report Authenticated By: Burman Nieves, M.D.     Microbiology: No results found for this or any previous visit (from the past 240 hour(s)).   Labs: Basic Metabolic Panel:  Recent Labs Lab 10/01/12 2242  NA 140  K 4.4  CL  105  CO2 27  GLUCOSE 107*  BUN 18  CREATININE 0.68  CALCIUM 9.0   Liver Function Tests:  Recent Labs Lab 10/01/12 2242  AST 16  ALT 14  ALKPHOS 99  BILITOT 0.3  PROT 6.1  ALBUMIN 3.4*   No results found for this basename: LIPASE, AMYLASE,  in the last 168 hours No results found for this basename: AMMONIA,  in the last 168 hours CBC:  Recent Labs Lab 10/01/12 2242  WBC 8.6  HGB 11.7*  HCT 35.4*  MCV 79.9  PLT 294   Cardiac Enzymes:  Recent Labs Lab 10/02/12 0535 10/02/12 1135  TROPONINI <0.30 <0.30   BNP: No components found with this basename: POCBNP,  CBG:  Recent Labs Lab 10/02/12 0728 10/02/12 1144 10/02/12 1618 10/02/12 1758 10/02/12 1920  GLUCAP 110* 98 84 87 152*    Time coordinating discharge:  Greater than 30 minutes  Signed:  Jamire Shabazz, DO [facility] Pager: [phone] 10/02/2012, 7:45 PM

## 2012-10-02 NOTE — ED Provider Notes (Signed)
Medical screening examination/treatment/procedure(s) were performed by non-physician practitioner and as supervising physician I was immediately available for consultation/collaboration.  Laurynn Mccorvey, MD 10/02/12 0705 

## 2012-10-02 NOTE — Progress Notes (Signed)
[facility] PROGRESS NOTE  [name] RUE:[id number] DOB: 1950/11/19 DOA: 10/01/2012 PCP: Pcp Not In System  Assessment/Plan: Atypical chest pain -Patient has history of established CAD -Status post PCI to the LAD 10/12/2008 -await cardiology evaluation -Continue aspirin/Plavix -I-stat Troponins neg x 2, serum troponin neg x 1 -EKG without any ST-T wave change Hypertension -Hold HCTZ and ARB due to soft blood pressure--may have been due to 4 nitroglycerin SL last night -Gentle fluid hydration Hyperlipidemia -Hold niacin as the patient has been having intolerance to flushing -Continue Zocor Diabetes mellitus type 2 -Hold metformin and by mouth pioglitazone -Novolog SS  is in the hospital -04/16/12 hemoglobin A1c 5.3 Family Communication:   husband at beside Disposition Plan:   Home when medically stable         Procedures/Studies: Dg Chest Portable 1 View  10/01/2012   *RADIOLOGY REPORT*  Clinical Data: Chest pain.  Dizziness.  Nausea.  PORTABLE CHEST - 1 VIEW  Comparison: 10/06/2008  Findings: Shallow inspiration.  Heart size and pulmonary vascularity are normal for technique.  There is obscuration of the left costophrenic angle which is probably due to soft tissue attenuation.  No definite effusion.  No pneumothorax.  No focal consolidation or airspace disease.  Mediastinal contours appear intact.  IMPRESSION: Shallow inspiration.  No evidence of active pulmonary disease.   Original Report Authenticated By: Burman Nieves, M.D.         Subjective: Patient denies any fevers, chills, chest discomfort, shortness breath, nausea, vomiting, diarrhea. No dizziness. No headache.  Objective: Filed Vitals:   10/02/12 0400 10/02/12 0558 10/02/12 0616 10/02/12 0630  BP: 115/43 121/48 98/25 102/50  Pulse: 57 61 60   Temp:   97.9 F (36.6 C)   TempSrc:   Oral   Resp: 16 18 18    Height:   5\' 1"  (1.549 m)   Weight:   97.523 kg (215 lb)   SpO2: 96% 100% 99%    No  intake or output data in the 24 hours ending 10/02/12 0816 Weight change:  Exam:   General:  Pt is alert, follows commands appropriately, not in acute distress  HEENT: No icterus, No thrush,  [place]/AT  Cardiovascular: RRR, S1/S2, no rubs, no gallops  Respiratory: CTA bilaterally, no wheezing, no crackles, no rhonchi  Abdomen: Soft/+BS, non tender, non distended, no guarding  Extremities: No edema, No lymphangitis, No petechiae, No rashes, no synovitis  Data Reviewed: Basic Metabolic Panel:  Recent Labs Lab 10/01/12 2242  NA 140  K 4.4  CL 105  CO2 27  GLUCOSE 107*  BUN 18  CREATININE 0.68  CALCIUM 9.0   Liver Function Tests:  Recent Labs Lab 10/01/12 2242  AST 16  ALT 14  ALKPHOS 99  BILITOT 0.3  PROT 6.1  ALBUMIN 3.4*   No results found for this basename: LIPASE, AMYLASE,  in the last 168 hours No results found for this basename: AMMONIA,  in the last 168 hours CBC:  Recent Labs Lab 10/01/12 2242  WBC 8.6  HGB 11.7*  HCT 35.4*  MCV 79.9  PLT 294   Cardiac Enzymes:  Recent Labs Lab 10/02/12 0535  TROPONINI <0.30   BNP: No components found with this basename: POCBNP,  CBG:  Recent Labs Lab 10/02/12 0728  GLUCAP 110*    No results found for this or any previous visit (from the past 240 hour(s)).   Scheduled Meds: . aspirin EC  81 mg Oral Daily  . cholecalciferol  1,000 Units Oral Daily  .  clopidogrel  75 mg Oral Daily  . famotidine  10 mg Oral BID  . ferrous sulfate  325 mg Oral Q breakfast  . potassium chloride SA  20 mEq Oral Daily  . simvastatin  40 mg Oral q1800  . sodium chloride  3 mL Intravenous Q12H   Continuous Infusions: . sodium chloride 100 mL/hr at 10/02/12 0752     Sereen Schaff, DO  [facility] Pager [phone]  If 7PM-7AM, please contact night-coverage [email or web address] Password [email or web address] 10/02/2012, 8:16 AM   LOS: 1 day

## 2012-10-02 NOTE — H&P (Signed)
[facility] History and Physical  [name] EAV:[id number] DOB: April 06, 1950 DOA: 10/01/2012  Referring physician: ED PCP: Pcp Not In System   Chief Complaint: Chest pain  HPI: [name] is a 62 y.o. female who presents to the ED with c/o chest pressure.  Symptoms onset at work yesterday and were associated with a large amount of stress.  She also at the same time was having flushing symptoms that she often has been getting and have been severe from the niacin she is on.  Patient took 3 extra 81mg  ASA this morning with no relief, 2SL nitro and 4mg  IV zofran provided minimal relief.  The pain when it was occuring is described as being more epigastric pain than anything else, not associated with DOE or SOB, but the patient is concerned given her history.  The patients symptoms occur in the context of known CAD stent in 2010 to her LAD.  She had been asymptomatic before having the stent placed but the CAD was detected during routine cardiac clearance for an elective orthopaedic procedure.  Trops in the ED were negative as was the rest of her work up.  Hospitalist has been asked to admit for [facility] as a CP r/o cardiac.  Review of Systems: 12 systems reviewed and otherwise negative.  Past Medical History  Diagnosis Date  . Anemia   . Diabetes mellitus without complication   . Hypertension    Past Surgical History  Procedure Laterality Date  . Tympanoplasty    . Carotid stent  2009   Social History:  reports that she has never smoked. She does not have any smokeless tobacco history on file. She reports that she does not drink alcohol or use illicit drugs.   No Known Allergies  Family History  Problem Relation Age of Onset  . Coronary artery disease Mother   . Rheum arthritis Mother   . Heart attack Father   . Hypertension Father   . Hypertension Brother   . Cancer Paternal Grandmother     stomach    Prior to Admission medications   Medication Sig Start Date End Date  Taking? Authorizing Provider  aspirin EC 81 MG tablet Take 81 mg by mouth daily.   Yes Historical Provider, MD  cholecalciferol (VITAMIN D) 1000 UNITS tablet Take 1,000 Units by mouth daily.   Yes Historical Provider, MD  clopidogrel (PLAVIX) 75 MG tablet Take 75 mg by mouth daily.   Yes Historical Provider, MD  famotidine (PEPCID) 10 MG tablet Take 10 mg by mouth 2 (two) times daily.   Yes Historical Provider, MD  ferrous sulfate 325 (65 FE) MG tablet Take 325 mg by mouth daily with breakfast.   Yes Historical Provider, MD  hydrochlorothiazide (HYDRODIURIL) 25 MG tablet Take 25 mg by mouth daily.   Yes Historical Provider, MD  pioglitazone-metformin (ACTOPLUS MET) 15-500 MG per tablet Take 1 tablet by mouth 2 (two) times daily with a meal.   Yes Historical Provider, MD  Potassium Chloride (KLOR-CON PO) Take 20 mEq by mouth daily.   Yes Historical Provider, MD  potassium chloride SA (K-DUR,KLOR-CON) 20 MEQ tablet Take 20 mEq by mouth daily.   Yes Historical Provider, MD  telmisartan (MICARDIS) 40 MG tablet Take 40 mg by mouth daily.   Yes Historical Provider, MD   Physical Exam: Filed Vitals:   10/02/12 0200 10/02/12 0300 10/02/12 0330 10/02/12 0400  BP: 100/44 89/38 104/49 115/43  Pulse: 71 64 61 57  Temp:  TempSrc:      Resp: 20 15 16 16   SpO2: 97% 96% 97% 96%    General:  NAD, resting comfortably in bed Eyes: PEERLA EOMI ENT: mucous membranes moist Neck: supple w/o JVD Cardiovascular: RRR w/o MRG Respiratory: CTA B Abdomen: soft, nt, nd, bs+ Skin: no rash nor lesion Musculoskeletal: MAE, full ROM all 4 extremities Psychiatric: normal tone and affect Neurologic: AAOx3, grossly non-focal  Labs on Admission:  Basic Metabolic Panel:  Recent Labs Lab 10/01/12 2242  NA 140  K 4.4  CL 105  CO2 27  GLUCOSE 107*  BUN 18  CREATININE 0.68  CALCIUM 9.0   Liver Function Tests:  Recent Labs Lab 10/01/12 2242  AST 16  ALT 14  ALKPHOS 99  BILITOT 0.3  PROT 6.1   ALBUMIN 3.4*   No results found for this basename: LIPASE, AMYLASE,  in the last 168 hours No results found for this basename: AMMONIA,  in the last 168 hours CBC:  Recent Labs Lab 10/01/12 2242  WBC 8.6  HGB 11.7*  HCT 35.4*  MCV 79.9  PLT 294   Cardiac Enzymes: No results found for this basename: CKTOTAL, CKMB, CKMBINDEX, TROPONINI,  in the last 168 hours  BNP (last 3 results) No results found for this basename: PROBNP,  in the last 8760 hours CBG: No results found for this basename: GLUCAP,  in the last 168 hours  Radiological Exams on Admission: Dg Chest Portable 1 View  10/01/2012   *RADIOLOGY REPORT*  Clinical Data: Chest pain.  Dizziness.  Nausea.  PORTABLE CHEST - 1 VIEW  Comparison: 10/06/2008  Findings: Shallow inspiration.  Heart size and pulmonary vascularity are normal for technique.  There is obscuration of the left costophrenic angle which is probably due to soft tissue attenuation.  No definite effusion.  No pneumothorax.  No focal consolidation or airspace disease.  Mediastinal contours appear intact.  IMPRESSION: Shallow inspiration.  No evidence of active pulmonary disease.   Original Report Authenticated By: Burman Nieves, M.D.    EKG: Independently reviewed.  Assessment/Plan Principal Problem:   Chest pain Active Problems:   Diabetes mellitus, type 2   HTN (hypertension)   1. Chest pain - somewhat atypical and probably not cardiac but patient does have a very worrisome cardiac history so admitting for serial troponins, cards to see in AM, may or may not require stress test TBD by cards. 2. DM2 - putting patient on q4h SSI 3. HTN - continue home meds 4. Flushing - stopping patients Niacin for now.    Code Status: Full Code (must indicate code status--if unknown or must be presumed, indicate so) Family Communication: Husband at bedside (indicate person spoken with, if applicable, with phone number if by telephone) Disposition Plan: Admit to obs  (indicate anticipated LOS)  Time spent: 70 min  GARDNER, JARED M. [facility] Pager [phone]  If 7PM-7AM, please contact night-coverage [email or web address] Password [facility] 10/02/2012, 5:34 AM

## 2012-10-02 NOTE — Progress Notes (Signed)
Utilization review completed. Kylee Nardozzi, RN, BSN. 

## 2012-10-02 NOTE — CV Procedure (Signed)
[name] is a 62 y.o. female    [id number] LOCATION:  FACILITY: [facility]  PHYSICIAN: Nanetta Batty, M.D. [birth year]   DATE OF PROCEDURE:  10/02/2012  DATE OF DISCHARGE:   CARDIAC CATHETERIZATION     History obtained from chart review.[name] is a 62 y.o. female who presents to the ED with c/o chest pressure. Discomfort began at work and associated with flushing symptoms secondary to niacin. No DOE, SOB, nausea or diaphoresis. She then developed chest pressure and nausea and went to Urgent care. She thought the flushing was secondary to stopping ASA. She was given ASA, cooled down and then had chest presssure. Sent to [facility] ER and NTG SL did resolve the pressure. She was very concerned about the pressure because it was the same that she had with her Lexiscan in 2010 that lead to her stent. Currently she is pain free and has been NPO.  History of CAD with DES stent to prox. LAD in 2010 with residual disease in LCX 10-20% and RCA 20% lesion. Ischemia, leading to the cath, was found on stress test for clearance for spine surgery. She also has HTN, DM, dyslipidemia and obesity.  Troponin is neg. EKG without acute changes.     PROCEDURE DESCRIPTION:    The patient was brought to the second floor [facility]iac cath lab in the postabsorptive state. She was premedicated with Valium 5 mg by mouth. Her right wristwas prepped and shaved in usual sterile fashion. Xylocaine 1% was used for local anesthesia. A 6 French sheath was inserted into the right radial artery using standard Seldinger technique. The patient received  5000 units  of heparin  intravenously.  A 5 [place] TIG catheter along with a 5 French pigtail catheter were used for selective artery angiography and left ventriculography respectively. Visipaque dye was used for the entirety of the case. Retrograde aorta, left ventricular pullback pressures were recorded. A total of 50 cc of contrast was administered to the patient. She  received radial cocktail via the SideArm sheath.    HEMODYNAMICS:    AO SYSTOLIC/AO DIASTOLIC: 148/62   LV SYSTOLIC/LV DIASTOLIC: 160/13  ANGIOGRAPHIC RESULTS:   1. Left main; normal  2. LAD; the proximal LAD stent was widely patent and the remainder of the vessel is free of significant disease 3. Left circumflex; nondominant and normal.  4. Right coronary artery; dominant the normal 5. Left ventriculography; RAO left ventriculogram was performed using  25 mL of Visipaque dye at 12 mL/second. The overall LVEF estimated  60 % Without wall motion abnormalities  IMPRESSION:widely patent proximal LAD stent with otherwise normal coronary arteries and normal LV function. I her chest pain was noncardiac and probably gastrointestinal. The sheath was removed and a TR band was placed on the right wrist to achieve patent hemostasis. The patient left the Cath Lab in stable condition. She will be hydrated for 2 hours and then discharged home. She will then followup with Dr. [place] Hilty as an outpatient.  Runell Gess MD, [facility] 10/02/2012 5:27 PM

## 2012-10-18 ENCOUNTER — Encounter: Payer: Self-pay | Admitting: *Deleted

## 2012-10-20 ENCOUNTER — Ambulatory Visit (INDEPENDENT_AMBULATORY_CARE_PROVIDER_SITE_OTHER): Payer: 59 | Admitting: Physician Assistant

## 2012-10-20 ENCOUNTER — Encounter: Payer: Self-pay | Admitting: Physician Assistant

## 2012-10-20 VITALS — BP 118/64 | HR 64 | Ht 61.0 in | Wt 218.8 lb

## 2012-10-20 DIAGNOSIS — E119 Type 2 diabetes mellitus without complications: Secondary | ICD-10-CM

## 2012-10-20 DIAGNOSIS — E66812 Obesity, class 2: Secondary | ICD-10-CM | POA: Insufficient documentation

## 2012-10-20 DIAGNOSIS — I10 Essential (primary) hypertension: Secondary | ICD-10-CM

## 2012-10-20 DIAGNOSIS — I2510 Atherosclerotic heart disease of native coronary artery without angina pectoris: Secondary | ICD-10-CM

## 2012-10-20 NOTE — Progress Notes (Signed)
Date:  10/20/2012   ID:  [name], DOB 04-08-1950, MRN [id number]  PCP:  Abbe Amsterdam, MD  Primary Cardiologist:  Hilty    History of Present Illness: [name] is a 62 y.o. female with a history of CAD with DES stent to prox. LAD in 2010 with residual disease in LCX 10-20% and RCA 20% lesion.  She also has HTN, DM, dyslipidemia and obesity.  She presented on 10/01/12 to the ED with c/o chest pressure. Discomfort began at work and associated with flushing symptoms secondary to niacin. No DOE, SOB, nausea or diaphoresis. She then developed chest pressure and nausea and went to Urgent care. She thought the flushing was secondary to stopping ASA. She was given ASA, cooled down and then had chest presssure. Sent to [facility] ER and NTG SL did resolve the pressure. She was very concerned about the pressure because it was the same that she had with her Lexiscan in 2010 that lead to her stent.  Coronary angiogram revealed widely patent proximal LAD stent with otherwise normal coronary arteries and normal LV function.  She presents today for follow up.  She is under a good deal of stress both at work and in regards to her brother who was diagnosed with stage IV pancreatic cancer.  She is his POA.  She currently denies nausea, vomiting, fever, chest pain, shortness of breath, orthopnea, dizziness, PND, cough, congestion, abdominal pain, hematochezia, melena, lower extremity edema, claudication.  Wt Readings from Last 3 Encounters:  10/20/12 218 lb 12.8 oz (99.247 kg)  10/02/12 215 lb (97.523 kg)  10/02/12 215 lb (97.523 kg)     Past Medical History  Diagnosis Date  . Anemia   . Diabetes mellitus without complication   . Hypertension   . CAD (coronary artery disease)     PCI to LAD in 2010, mild residual disease in LCX and RCA  . Hyperlipidemia LDL goal < 70     Current Outpatient Prescriptions  Medication Sig Dispense Refill  . aspirin EC 81 MG tablet Take 81 mg by mouth daily.      .  cholecalciferol (VITAMIN D) 1000 UNITS tablet Take 1,000 Units by mouth daily.      . clopidogrel (PLAVIX) 75 MG tablet Take 75 mg by mouth daily.      . famotidine (PEPCID) 10 MG tablet Take 10 mg by mouth 2 (two) times daily.      . ferrous sulfate 325 (65 FE) MG tablet Take 325 mg by mouth daily with breakfast.      . hydrochlorothiazide (HYDRODIURIL) 25 MG tablet Take 25 mg by mouth daily.      . potassium chloride SA (K-DUR,KLOR-CON) 20 MEQ tablet Take 20 mEq by mouth daily.      . simvastatin (ZOCOR) 40 MG tablet Take 1 tablet (40 mg total) by mouth daily at 6 PM.  30 tablet  0  . telmisartan (MICARDIS) 40 MG tablet Take 40 mg by mouth daily.       No current facility-administered medications for this visit.    Allergies:   No Known Allergies  Social History:  The patient  reports that she has never smoked. She has never used smokeless tobacco. She reports that she does not drink alcohol or use illicit drugs.   Family history:   Family History  Problem Relation Age of Onset  . Coronary artery disease Mother   . Rheum arthritis Mother   . Heart attack Father   .  Hypertension Father   . Hypertension Brother   . Cancer Paternal Grandmother     stomach    ROS:  Please see the history of present illness.  All other systems reviewed and negative.   PHYSICAL EXAM: VS:  BP 118/64  Pulse 64  Ht 5\' 1"  (1.549 m)  Wt 218 lb 12.8 oz (99.247 kg)  BMI 41.36 kg/m2 Well nourished, well developed, in no acute distress HEENT: Pupils are equal round react to light accommodation extraocular movements are intact.  Cardiac: Regular rate and rhythm without murmurs rubs or gallops. Lungs:  clear to auscultation bilaterally, no wheezing, rhonchi or rales Ext: 1+ lower extremity edema.  2+ radial and dorsalis pedis pulses. Right wrist cath site healed nicely Skin: warm and dry Neuro:  Grossly normal    ASSESSMENT AND PLAN:  Problem List Items Addressed This Visit   Obesity, morbid, BMI  40.0-49.9     We discussed weight reduction and I have referred her for medical nutrition therapy.    HTN (hypertension)     Well controlled at this time.    Diabetes mellitus, type 2   CAD (coronary artery disease)PCI to LAD in 2010, mild residual disease in LCX and RCA - Primary     Patent stent and otherwise normal coronaries found on recent cath. Normal LVEF.

## 2012-10-20 NOTE — Assessment & Plan Note (Signed)
Well-controlled  at this time 

## 2012-10-20 NOTE — Assessment & Plan Note (Addendum)
We discussed weight reduction and I have referred her for medical nutrition therapy.

## 2012-10-20 NOTE — Patient Instructions (Signed)
Follow up with Dr. Hilty in six months. 

## 2012-10-20 NOTE — Assessment & Plan Note (Signed)
Patent stent and otherwise normal coronaries found on recent cath. Normal LVEF.

## 2012-11-26 ENCOUNTER — Ambulatory Visit (INDEPENDENT_AMBULATORY_CARE_PROVIDER_SITE_OTHER): Payer: 59 | Admitting: Family Medicine

## 2012-11-26 VITALS — BP 104/60 | HR 60 | Temp 98.1°F | Resp 20 | Ht 61.0 in | Wt 221.0 lb

## 2012-11-26 DIAGNOSIS — M549 Dorsalgia, unspecified: Secondary | ICD-10-CM

## 2012-11-26 DIAGNOSIS — Z0000 Encounter for general adult medical examination without abnormal findings: Secondary | ICD-10-CM

## 2012-11-26 LAB — POCT CBC
HCT, POC: 41 % (ref 37.7–47.9)
Hemoglobin: 12.7 g/dL (ref 12.2–16.2)
Lymph, poc: 2.7 (ref 0.6–3.4)
MCH, POC: 25.9 pg — AB (ref 27–31.2)
MCHC: 31 g/dL — AB (ref 31.8–35.4)
MCV: 83.6 fL (ref 80–97)
POC Granulocyte: 3.7 (ref 2–6.9)
WBC: 7 10*3/uL (ref 4.6–10.2)

## 2012-11-26 LAB — POCT GLYCOSYLATED HEMOGLOBIN (HGB A1C): Hemoglobin A1C: 5.7

## 2012-11-26 LAB — COMPREHENSIVE METABOLIC PANEL
BUN: 16 mg/dL (ref 6–23)
CO2: 26 mEq/L (ref 19–32)
Glucose, Bld: 91 mg/dL (ref 70–99)
Sodium: 140 mEq/L (ref 135–145)
Total Bilirubin: 0.4 mg/dL (ref 0.3–1.2)
Total Protein: 7.2 g/dL (ref 6.0–8.3)

## 2012-11-26 LAB — TSH: TSH: 1.599 u[IU]/mL (ref 0.350–4.500)

## 2012-11-26 MED ORDER — DIAZEPAM 2 MG PO TABS: 2.0000 mg | ORAL_TABLET | Freq: Three times a day (TID) | ORAL | Status: DC | PRN

## 2012-11-26 MED ORDER — PREDNISONE 20 MG PO TABS: ORAL_TABLET | ORAL | Status: DC

## 2012-11-26 NOTE — Progress Notes (Signed)
[facility] [address], [place] [address] [phone]  Date:  11/26/2012   Name:  [name]   DOB:  [birth year]   MRN:  [id number]  PCP:  Abbe Amsterdam, MD    Chief Complaint: Annual Exam and Numbness   History of Present Illness:  [name] is a 62 y.o. very pleasant female patient who presents with the following:  Here today for a PE.  History of HTN, obesity, diet controlled DM and CAD.  Her brother was recently dx with stage IV pancreatic cancer in [place] of this year, and she is very involved in his care which is stressful.   Fasting today for labs.    She will get her flu shot at work.   She does note some back pain, and numbness down her left leg "all the way to my toes."  She is driving to [facility] a lot recently to care for her brother.  She has had back problems for a long time, but has been worse for the last week.  No saddle anesthesia or bowel/ bladder incontinence  She would like an rx for valium for her leg pain/ muscle spasm.  She has used some of her husband's and feels this helps more with her pain than flexeril and is less sedating.  She has not yet been back in contact with Dr. Shelle Iron about this but plans to call his office.   She has not yet had the zostavax vaccine and would like to have this.    She has noted some night sweats for about one month.   She had a total hysterectomy in 2001.  Still sees her OB Mammogram UTD- last year.  Colonoscopy 2008, 10 year recall given Patient Active Problem List   Diagnosis Date Noted  . Obesity, morbid, BMI 40.0-49.9 10/20/2012  . Chest pain 10/02/2012  . CAD (coronary artery disease)PCI to LAD in 2010, mild residual disease in LCX and RCA   . Hyperlipidemia LDL goal < 70   . Diabetes mellitus, type 2 04/16/2012  . HTN (hypertension) 04/16/2012    Past Medical History  Diagnosis Date  . Anemia   . Diabetes mellitus without complication   . Hypertension   . CAD (coronary artery  disease)     PCI to LAD in 2010, mild residual disease in LCX and RCA  . Hyperlipidemia LDL goal < 70     Past Surgical History  Procedure Laterality Date  . Tympanoplasty    . Carotid stent  2009  . Coronary angioplasty with stent placement  2010    Stent DES, Xience to prox. LAD  . Doppler echocardiography  08/01/2009    EF=>55%,LV normal  . Nm myocar perf wall motion  09/22/2008    lexiscan-EF 83%; glogal LV systolic fx is norm. ,evidence of mild ischemia basal anterior,midanterior and apical lateral region(s).   . Lower arterial duplex  06/20/10    abi's normal,rgt 0.98,lft 1.06;bilateral PVRs normal    History  Substance Use Topics  . Smoking status: Never Smoker   . Smokeless tobacco: Never Used  . Alcohol Use: No    Family History  Problem Relation Age of Onset  . Coronary artery disease Mother   . Rheum arthritis Mother   . Heart attack Father   . Hypertension Father   . Hypertension Brother   . Cancer Paternal Grandmother     stomach    No Known Allergies  Medication list has been  reviewed and updated.  Current Outpatient Prescriptions on File Prior to Visit  Medication Sig Dispense Refill  . aspirin EC 81 MG tablet Take 81 mg by mouth daily.      . cholecalciferol (VITAMIN D) 1000 UNITS tablet Take 1,000 Units by mouth daily.      . clopidogrel (PLAVIX) 75 MG tablet Take 75 mg by mouth daily.      . famotidine (PEPCID) 10 MG tablet Take 10 mg by mouth 2 (two) times daily.      . ferrous sulfate 325 (65 FE) MG tablet Take 325 mg by mouth daily with breakfast.      . hydrochlorothiazide (HYDRODIURIL) 25 MG tablet Take 25 mg by mouth daily.      . potassium chloride SA (K-DUR,KLOR-CON) 20 MEQ tablet Take 20 mEq by mouth daily.      . simvastatin (ZOCOR) 40 MG tablet Take 1 tablet (40 mg total) by mouth daily at 6 PM.  30 tablet  0  . telmisartan (MICARDIS) 40 MG tablet Take 40 mg by mouth daily.       No current facility-administered medications on file  prior to visit.    Review of Systems:  As per HPI- otherwise negative.   Physical Examination: Filed Vitals:   11/26/12 0917  BP: 104/60  Pulse: 60  Temp: 98.1 F (36.7 C)  Resp: 20   Filed Vitals:   11/26/12 0917  Height: 5\' 1"  (1.549 m)  Weight: 221 lb (100.245 kg)   Body mass index is 41.78 kg/(m^2). Ideal Body Weight: Weight in (lb) to have BMI = 25: 132  GEN: WDWN, NAD, Non-toxic, A & O x 3, obese, accompanied by her husband HEENT: Atraumatic, Normocephalic. Neck supple. No masses, No LAD.  Bilateral TM wnl, oropharynx normal.  PEERL,EOMI.   Ears and Nose: No external deformity. CV: RRR, No M/G/R. No JVD. No thrill. No extra heart sounds. PULM: CTA B, no wheezes, crackles, rhonchi. No retractions. No resp. distress. No accessory muscle use. ABD: S, NT, ND, +BS. No rebound. No HSM. EXTR: No c/c/e NEURO Normal gait.  PSYCH: Normally interactive. Conversant. Not depressed or anxious appearing.  Calm demeanor.  Back: she is tender over the left sciatic notch.  Negative SLR bilaterally.  Normal leg strength, sensation and DTR bilaterally  Breast/GU: defer as she sees her OBG regularly.  UTD on her mammogram  Results for orders placed in visit on 11/26/12  POCT CBC      Result Value Range   WBC 7.0  4.6 - 10.2 K/uL   Lymph, poc 2.7  0.6 - 3.4   POC LYMPH PERCENT 38.2  10 - 50 %L   MID (cbc) 0.7  0 - 0.9   POC MID % 9.5  0 - 12 %M   POC Granulocyte 3.7  2 - 6.9   Granulocyte percent 52.3  37 - 80 %G   RBC 4.91  4.04 - 5.48 M/uL   Hemoglobin 12.7  12.2 - 16.2 g/dL   HCT, POC 16.1  09.6 - 47.9 %   MCV 83.6  80 - 97 fL   MCH, POC 25.9 (*) 27 - 31.2 pg   MCHC 31.0 (*) 31.8 - 35.4 g/dL   RDW, POC 04.5     Platelet Count, POC 268  142 - 424 K/uL   MPV 8.8  0 - 99.8 fL  POCT GLYCOSYLATED HEMOGLOBIN (HGB A1C)      Result Value Range   Hemoglobin A1C 5.7  Assessment and Plan: Physical exam - Plan: POCT CBC, Comprehensive metabolic panel, TSH, POCT glycosylated  hemoglobin (Hb A1C), Hepatitis C antibody  Back pain - Plan: diazepam (VALIUM) 2 MG tablet, predniSONE (DELTASONE) 20 MG tablet  pmeumovax and Tdap UTD.  She will get her flu shot at work- she works at [employer] and it is easier for her to have it done at work.   BP controlled; continue medications Continue aspirin and plavix as per cardiology recommendation Sciatica: she is very bothered by her leg pain.  She wouldlike to try a short course of prednisone, and has been able to tolerate it in the past.  She will check her glucose more regularly and let me know if any problems.  Separate dose from her daily aspirin. Gave a supply of valium to use for her back spasm and pain, cautioned regarding sedation.   Will plan further follow- up pending labs.   Signed Abbe Amsterdam, MD

## 2012-11-26 NOTE — Patient Instructions (Addendum)
I will be in touch with the rest of your labs.   Don't forget to get your flu shot this year!   Use the prednisone for your back pain.  Check your glucose once or twice a day while you are on the prednisone.  If your sugar gets above about 250 please let me know. In that case we will need to reduce or stop your prednisone.   Let me know if your back is not better soon!

## 2012-11-27 ENCOUNTER — Encounter: Payer: Self-pay | Admitting: Family Medicine

## 2012-12-07 ENCOUNTER — Encounter (HOSPITAL_COMMUNITY): Payer: Self-pay | Admitting: Nurse Practitioner

## 2012-12-07 ENCOUNTER — Emergency Department (HOSPITAL_COMMUNITY): Payer: 59

## 2012-12-07 ENCOUNTER — Ambulatory Visit (INDEPENDENT_AMBULATORY_CARE_PROVIDER_SITE_OTHER): Payer: 59 | Admitting: Family Medicine

## 2012-12-07 ENCOUNTER — Emergency Department (HOSPITAL_COMMUNITY)
Admission: EM | Admit: 2012-12-07 | Discharge: 2012-12-07 | Disposition: A | Discharge status: Home / Self Care | Payer: 59 | Attending: Emergency Medicine | Admitting: Emergency Medicine

## 2012-12-07 VITALS — BP 148/98 | HR 82 | Temp 98.2°F | Resp 16 | Ht 61.0 in | Wt 222.0 lb

## 2012-12-07 DIAGNOSIS — G8929 Other chronic pain: Secondary | ICD-10-CM | POA: Insufficient documentation

## 2012-12-07 DIAGNOSIS — M545 Low back pain, unspecified: Secondary | ICD-10-CM

## 2012-12-07 DIAGNOSIS — E119 Type 2 diabetes mellitus without complications: Secondary | ICD-10-CM | POA: Insufficient documentation

## 2012-12-07 DIAGNOSIS — Y939 Activity, unspecified: Secondary | ICD-10-CM | POA: Insufficient documentation

## 2012-12-07 DIAGNOSIS — M79605 Pain in left leg: Secondary | ICD-10-CM

## 2012-12-07 DIAGNOSIS — I2510 Atherosclerotic heart disease of native coronary artery without angina pectoris: Secondary | ICD-10-CM | POA: Insufficient documentation

## 2012-12-07 DIAGNOSIS — Z79899 Other long term (current) drug therapy: Secondary | ICD-10-CM | POA: Insufficient documentation

## 2012-12-07 DIAGNOSIS — R110 Nausea: Secondary | ICD-10-CM

## 2012-12-07 DIAGNOSIS — R200 Anesthesia of skin: Secondary | ICD-10-CM

## 2012-12-07 DIAGNOSIS — R209 Unspecified disturbances of skin sensation: Secondary | ICD-10-CM

## 2012-12-07 DIAGNOSIS — D649 Anemia, unspecified: Secondary | ICD-10-CM | POA: Insufficient documentation

## 2012-12-07 DIAGNOSIS — R519 Headache, unspecified: Secondary | ICD-10-CM

## 2012-12-07 DIAGNOSIS — Y9289 Other specified places as the place of occurrence of the external cause: Secondary | ICD-10-CM | POA: Insufficient documentation

## 2012-12-07 DIAGNOSIS — W1809XA Striking against other object with subsequent fall, initial encounter: Secondary | ICD-10-CM | POA: Insufficient documentation

## 2012-12-07 DIAGNOSIS — S0990XA Unspecified injury of head, initial encounter: Secondary | ICD-10-CM | POA: Insufficient documentation

## 2012-12-07 DIAGNOSIS — Z7982 Long term (current) use of aspirin: Secondary | ICD-10-CM | POA: Insufficient documentation

## 2012-12-07 DIAGNOSIS — S79919A Unspecified injury of unspecified hip, initial encounter: Secondary | ICD-10-CM | POA: Insufficient documentation

## 2012-12-07 DIAGNOSIS — S060X9A Concussion with loss of consciousness of unspecified duration, initial encounter: Secondary | ICD-10-CM | POA: Insufficient documentation

## 2012-12-07 DIAGNOSIS — M25551 Pain in right hip: Secondary | ICD-10-CM

## 2012-12-07 DIAGNOSIS — E669 Obesity, unspecified: Secondary | ICD-10-CM

## 2012-12-07 DIAGNOSIS — E785 Hyperlipidemia, unspecified: Secondary | ICD-10-CM | POA: Insufficient documentation

## 2012-12-07 DIAGNOSIS — I10 Essential (primary) hypertension: Secondary | ICD-10-CM | POA: Insufficient documentation

## 2012-12-07 LAB — POCT I-STAT TROPONIN I: Troponin i, poc: 0 ng/mL (ref 0.00–0.08)

## 2012-12-07 LAB — BASIC METABOLIC PANEL
CO2: 27 mEq/L (ref 19–32)
Calcium: 9.8 mg/dL (ref 8.4–10.5)
Creatinine, Ser: 0.77 mg/dL (ref 0.50–1.10)
GFR calc non Af Amer: 88 mL/min — ABNORMAL LOW (ref >90)
Glucose, Bld: 115 mg/dL — ABNORMAL HIGH (ref 70–99)
Sodium: 137 mEq/L (ref 135–145)

## 2012-12-07 LAB — CBC
Hemoglobin: 12.6 g/dL (ref 12.0–15.0)
MCH: 26.8 pg (ref 26.0–34.0)
MCHC: 33.5 g/dL (ref 30.0–36.0)
MCV: 79.8 fL (ref 78.0–100.0)
Platelets: 316 10*3/uL (ref 150–400)

## 2012-12-07 MED ORDER — ONDANSETRON 4 MG PO TBDP
4.0000 mg | ORAL_TABLET | Freq: Once | ORAL | Status: AC
  Administered 2012-12-07: 4 mg via ORAL

## 2012-12-07 MED ORDER — ONDANSETRON HCL 4 MG/2ML IJ SOLN
4.0000 mg | Freq: Once | INTRAMUSCULAR | Status: AC
  Administered 2012-12-07: 4 mg via INTRAVENOUS
  Filled 2012-12-07: qty 2

## 2012-12-07 MED ORDER — PREDNISONE 20 MG PO TABS: ORAL_TABLET | ORAL | Status: DC

## 2012-12-07 MED ORDER — OXYCODONE-ACETAMINOPHEN 5-325 MG PO TABS: 1.0000 | ORAL_TABLET | ORAL | Status: DC | PRN

## 2012-12-07 MED ORDER — TRAMADOL HCL 50 MG PO TABS: 50.0000 mg | ORAL_TABLET | Freq: Four times a day (QID) | ORAL | Status: DC | PRN

## 2012-12-07 MED ORDER — MORPHINE SULFATE 4 MG/ML IJ SOLN
4.0000 mg | Freq: Once | INTRAMUSCULAR | Status: AC
  Administered 2012-12-07: 4 mg via INTRAVENOUS
  Filled 2012-12-07: qty 1

## 2012-12-07 NOTE — Progress Notes (Signed)
Subjective: Patient has a long history of back problems. Last week [place] was [place] the beach and a taken a little walk on the beach, then was back up on the deck of a cottage. She bent over to pick up a bottle of ice in the cooler right away from her causing her to tumble and fall backwards. Subsequent to that she's been having a lot more problems with pain in her back and hips. She had prior to the fall been having problems with numbness going down her left leg. She did talk with Dr. Dallas Schimke about this at her physical a couple of weeks ago, and was given a six-day low dose taper of prednisone. She had been doing better before she took the fall. She feels like she's been following more, not certain whether it's related to the bad leg or not. She would like her back to Dr. Jannifer Franklin who has seen her in the past as her orthopedist for his back. She has some Vicodin but she does not like to use it because it makes her drowsy to work. She would like to try having some Tylenol.  Objective: Pleasant alert lady who is in some discomfort obviously. Her back is tender in the midline little low lumbar spine region. She does not have a great deal of tenderness of the paraspinous muscles. She is very tender in the muscles of the hip and sacroiliac regions. Straight leg raising test is essentially negative though she gets pain in her back there is no radiculopathy from it. Deep tender reflexes are minimal.  Assessment: Low back pain Left lumbar radiculopathy with numbness History of well-controlled diabetes Overweight  Plan: We'll go ahead and retreat with prednisone even though she had a low-dose course a couple of weeks ago. He is to come in all. Make a referral to the orthopedist.  Incidentally at the end of the exam she started having nausea. She felt like from the pain. We gave her 1 Zofran 4 mg.

## 2012-12-07 NOTE — ED Notes (Addendum)
Pt has a history of back pain, fell last week onto her back but did not have any pain at that time. Today she began to have upper back pain, nausea, headache, and noticed her L leg felt numb when she woke up. She is ambulatory, a&ox4, cms intact. Pt went to [facility] and her bp was elevated. They gave her zofran and sent her to ER for further eval. States zofran did not help the nausea

## 2012-12-07 NOTE — ED Notes (Signed)
Dr Ward back at the bedside.

## 2012-12-07 NOTE — ED Provider Notes (Signed)
TIME SEEN: 4:47 PM  CHIEF COMPLAINT: Headache, right hip pain  HPI: Patient is a 62 y.o. female with a history of hyperlipidemia, hypertension, CAD on Plavix, diabetes who presents emergency department after head injury on Thursday, 4 days ago. Patient [place] was [place] for a cooler when she fell backwards striking her head and right hip. She reports she did have a brief loss of consciousness. She did not immediately have headache on Friday began having gradual onset headache that is progressively got worse. She denies a prior history of similar headaches. She's also complaining of right hip pain is worse with movement. Both are described as an achy pain without radiation. She has had nausea but no vomiting. She also describes intermittent tingling in her left lower extremity which is not new for her. She has had this numbness in her leg D2 chronic back pain for several years. No other numbness or focal weakness. No changes in her vision or hearing. Denies any chest pain, shortness of breath, palpitations or dizziness that caused her to fall.  ROS: See HPI Constitutional: no fever  Eyes: no drainage  ENT: no runny nose   Cardiovascular:  no chest pain  Resp: no SOB  GI: no vomiting GU: no dysuria Integumentary: no rash  Allergy: no hives  Musculoskeletal: no leg swelling  Neurological: no slurred speech ROS otherwise negative  PAST MEDICAL HISTORY/PAST SURGICAL HISTORY:  Past Medical History  Diagnosis Date  . Anemia   . Diabetes mellitus without complication   . Hypertension   . CAD (coronary artery disease)     PCI to LAD in 2010, mild residual disease in LCX and RCA  . Hyperlipidemia LDL goal < 70     MEDICATIONS:  Prior to Admission medications   Medication Sig Start Date End Date Taking? Authorizing Provider  aspirin EC 81 MG tablet Take 81 mg by mouth daily.    Historical Provider, MD  cholecalciferol (VITAMIN D) 1000 UNITS tablet Take 1,000  Units by mouth daily.    Historical Provider, MD  clopidogrel (PLAVIX) 75 MG tablet Take 75 mg by mouth daily.    Historical Provider, MD  diazepam (VALIUM) 2 MG tablet Take 1 tablet (2 mg total) by mouth every 8 (eight) hours as needed. For muscle spasm 11/26/12   Pearline Cables, MD  famotidine (PEPCID) 10 MG tablet Take 10 mg by mouth 2 (two) times daily.    Historical Provider, MD  ferrous sulfate 325 (65 FE) MG tablet Take 325 mg by mouth daily with breakfast.    Historical Provider, MD  hydrochlorothiazide (HYDRODIURIL) 25 MG tablet Take 25 mg by mouth daily.    Historical Provider, MD  potassium chloride SA (K-DUR,KLOR-CON) 20 MEQ tablet Take 20 mEq by mouth daily.    Historical Provider, MD  predniSONE (DELTASONE) 20 MG tablet Take 3 daily for 2 days, then 2 daily for 2 days, then one daily for 2 days 12/07/12   Peyton Najjar, MD  simvastatin (ZOCOR) 40 MG tablet Take 1 tablet (40 mg total) by mouth daily at 6 PM. 10/02/12   Catarina Hartshorn, MD  telmisartan (MICARDIS) 40 MG tablet Take 40 mg by mouth daily.    Historical Provider, MD  traMADol (ULTRAM) 50 MG tablet Take 1 tablet (50 mg total) by mouth every 6 (six) hours as needed for pain. 12/07/12   Peyton Najjar, MD    ALLERGIES:  No Known Allergies  SOCIAL HISTORY:  History  Substance Use Topics  . Smoking status: Never Smoker   . Smokeless tobacco: Never Used  . Alcohol Use: No    FAMILY HISTORY: Family History  Problem Relation Age of Onset  . Coronary artery disease Mother   . Rheum arthritis Mother   . Heart attack Father   . Hypertension Father   . Hypertension Brother   . Cancer Paternal Grandmother     stomach    EXAM: BP 170/85  Pulse 85  Temp(Src) 98 F (36.7 C) (Oral)  Resp 16  Ht 5\' 1"  (1.549 m)  Wt 222 lb (100.699 kg)  BMI 41.97 kg/m2  SpO2 97% CONSTITUTIONAL: Alert and oriented and responds appropriately to questions. Well-appearing; well-nourished HEAD: Normocephalic EYES: Conjunctivae clear,  PERRL ENT: normal nose; no rhinorrhea; moist mucous membranes; pharynx without lesions noted NECK: Supple, no meningismus, no LAD  CARD: RRR; S1 and S2 appreciated; no murmurs, no clicks, no rubs, no gallops RESP: Normal chest excursion without splinting or tachypnea; breath sounds clear and equal bilaterally; no wheezes, no rhonchi, no rales,  ABD/GI: Normal bowel sounds; non-distended; soft, non-tender, no rebound, no guarding BACK:  The back appears normal and is non-tender to palpation, there is no CVA tenderness abdomen and midline spinal tenderness, step-off or deformity EXT: Pain to palpation over the right lateral hip with normal range of motion, 2+ DP pulses bilaterally, sensation to light touch intact diffusely, Normal ROM in all joints; non-tender to palpation; no edema; normal capillary refill; no cyanosis    SKIN: Normal color for age and race; warm NEURO: Moves all extremities equally strength 5/5 in all 4 extremities, sensation to light touch intact diffusely, greater nerves II through XII grossly intact PSYCH: The patient's mood and manner are appropriate. Grooming and personal hygiene are appropriate.  MEDICAL DECISION MAKING: Patient with mechanical fall with headache with no neurologic deficit and right hip pain. We'll obtain imaging of her head, cervical spine and right hip. We'll give pain in nausea medication and reassess. Patient is currently hemodynamically stable.  ED PROGRESS: Patient's imaging is unremarkable. She reports feeling much better after IV pain medication. Discussed with patient at length return precautions. Likely postconcussive syndrome. She has outpatient followup. Patient verbalizes understanding is comfortable this plan.   Date: 12/07/2012 15:28  Rate: 84  Rhythm: normal sinus rhythm  QRS Axis: normal  Intervals: normal  ST/T Wave abnormalities: normal  Conduction Disutrbances: none  Narrative Interpretation: unremarkable; no ischemic changes, no  arrhythmia      Layla Maw [place] Th[place]ong, DO 12/07/12 1939

## 2012-12-07 NOTE — Patient Instructions (Signed)
Take tapered dose of prednisone.  Refer to orthopedic  Tramadol for pain. You can use the hydrocodone the ER and a half at nighttime  Call back if the nausea persists

## 2012-12-08 ENCOUNTER — Telehealth: Payer: Self-pay

## 2012-12-08 ENCOUNTER — Other Ambulatory Visit: Payer: Self-pay | Admitting: Family Medicine

## 2012-12-08 DIAGNOSIS — R110 Nausea: Secondary | ICD-10-CM

## 2012-12-08 DIAGNOSIS — G4700 Insomnia, unspecified: Secondary | ICD-10-CM

## 2012-12-08 DIAGNOSIS — E7800 Pure hypercholesterolemia, unspecified: Secondary | ICD-10-CM

## 2012-12-08 MED ORDER — ZOLPIDEM TARTRATE 10 MG PO TABS: 5.0000 mg | ORAL_TABLET | Freq: Every evening | ORAL | Status: DC | PRN

## 2012-12-08 MED ORDER — ONDANSETRON HCL 8 MG PO TABS: 8.0000 mg | ORAL_TABLET | Freq: Three times a day (TID) | ORAL | Status: DC | PRN

## 2012-12-08 MED ORDER — SIMVASTATIN 40 MG PO TABS: 40.0000 mg | ORAL_TABLET | Freq: Every day | ORAL | Status: DC

## 2012-12-08 NOTE — Telephone Encounter (Signed)
Pharm requests a Rx for simvastatin for pt. EPIC record shows that most recent Rx was written by Dr Tat for 40 mg QD. Dr Patsy Lager, do you want to Rx this for pt?

## 2012-12-08 NOTE — Telephone Encounter (Signed)
PT STATES SHE HAVE BEEN SEEN FOR A LOT OF ISSUES LATELY AND HAD TO GO TO THE [facility] LAST NIGHT AND SAW AN ORTH, GIVEN PAIN MEDICINE THEY IS MAKING HER NAUSEA. WOULD LIKE TO HAVE SOMETHING CALLED IN. PLEASE CALL [phone]   [facility] ON [address]

## 2012-12-08 NOTE — Addendum Note (Signed)
Addended by: Abbe Amsterdam C on: 12/08/2012 04:56 PM   Modules accepted: Orders

## 2012-12-08 NOTE — Telephone Encounter (Signed)
Called and discussed with her.  She was seen at the ED yesterday due to worsening HA.  She had negative CT of her head and neck.  She was dx with a concussion.  She still has a HA and feels nauseated.   She would like to try some zofran, and needs a RF of her ambien as well.  She has used [place] in the past but her current rx is expired.  She knows not to use ambien along with any other sedating medication

## 2012-12-16 ENCOUNTER — Other Ambulatory Visit: Payer: Self-pay | Admitting: Internal Medicine

## 2012-12-16 NOTE — Telephone Encounter (Signed)
Rx was sent to pharmacy electronically. 

## 2012-12-17 ENCOUNTER — Telehealth: Payer: Self-pay

## 2012-12-17 NOTE — Telephone Encounter (Signed)
[facility] outpt pharm sent request for RF of zolpidem. We just sent a Rx to [facility] 12/08/12 for #15 which should last 30 days according to sig of 1/2 tab Qhs. Called and spoke w/pharm who reported that their older Rx they have on file was for 1/2-1 tab Qhs and pt reported that she has been taking 1 tab Qhs and will run out and need RF on 12/22/12. Dr Patsy Lager, do you want pt taking zolpidem 10 mg, 1 tab Qhs, and do you want to give RF to [facility] to be filled 9/30?

## 2012-12-18 NOTE — Telephone Encounter (Signed)
Called her back- she did not need a refill of her Remus [name]- has only taken a 1/2 tab so far.  She was just trying to get the rx transferred to [facility] outpt pharmacy for the next time she needs a refill

## 2012-12-21 ENCOUNTER — Other Ambulatory Visit: Payer: Self-pay | Admitting: Family Medicine

## 2012-12-21 ENCOUNTER — Telehealth: Payer: Self-pay | Admitting: Family Medicine

## 2012-12-21 ENCOUNTER — Telehealth: Payer: Self-pay | Admitting: *Deleted

## 2012-12-21 NOTE — Telephone Encounter (Signed)
Faxed order to hold Plavix for 5 days prior to procedure and restart after to [facility] [phone]) for lumbar decompression.

## 2012-12-21 NOTE — Telephone Encounter (Signed)
I discussed with [name] on Friday.  She needs pre- op clearance from me, and is also seeing her cardiologist today.  She plans to have a lumbar decompression procedure asap per [facility] ortho.   Recent visit earlier this month: normal renal function (GFR just slightly reduce), A1c very well controlled at 5.7.   Recent CXR negative (12/07/12). Her BP was 104/60 at our most recent visit.  Assuming she is cleared from a cardiac standpoint she is cleared for surgery.

## 2012-12-23 ENCOUNTER — Other Ambulatory Visit: Payer: Self-pay | Admitting: Orthopedic Surgery

## 2012-12-24 ENCOUNTER — Ambulatory Visit: Payer: 59 | Attending: Specialist | Admitting: Physical Therapy

## 2012-12-24 DIAGNOSIS — IMO0001 Reserved for inherently not codable concepts without codable children: Secondary | ICD-10-CM | POA: Insufficient documentation

## 2012-12-24 DIAGNOSIS — M545 Low back pain, unspecified: Secondary | ICD-10-CM | POA: Insufficient documentation

## 2012-12-24 DIAGNOSIS — M25559 Pain in unspecified hip: Secondary | ICD-10-CM | POA: Insufficient documentation

## 2012-12-28 ENCOUNTER — Other Ambulatory Visit (HOSPITAL_COMMUNITY): Payer: Self-pay | Admitting: Orthopedic Surgery

## 2012-12-28 ENCOUNTER — Encounter (HOSPITAL_COMMUNITY): Payer: Self-pay | Admitting: Pharmacy Technician

## 2012-12-28 NOTE — Patient Instructions (Addendum)
20 [name]  12/28/2012   Your procedure is scheduled on: 01-06-2013  Report to [facility] at  630 AM.  Call this number if you have problems the morning of surgery [phone]   Remember:   Do not eat food or drink liquids :After Midnight.     Take these medicines the morning of surgery with A SIP OF WATER: percocet if needed                                SEE [place] PREPARING FOR SURGERY SHEET             You may not have any metal on your body including hair pins and piercings  Do not wear jewelry, make-up.  Do not wear lotions, powders, or perfumes. You may wear deodorant.   Men may shave face and neck.  Do not bring valuables to the hospital. [facility] IS NOT RESPONSIBLE FOR VALUEABLES.  Contacts, dentures or bridgework may not be worn into surgery.  Leave suitcase in the car. After surgery it may be brought to your room.  For patients admitted to the hospital, checkout time is 11:00 AM the day of discharge.   Patients discharged the day of surgery will not be allowed to drive home.  Name and phone number of your driver:  Special Instructions: N/A   Please read over the following fact sheets that you were given: mrsa information, incemtive spirometer sheet  Call Cain Sieve RN pre op nurse if needed [phone]    FAILURE TO FOLLOW THESE INSTRUCTIONS MAY RESULT IN THE CANCELLATION OF YOUR SURGERY.  PATIENT SIGNATURE___________________________________________  NURSE SIGNATURE_____________________________________________

## 2012-12-28 NOTE — Progress Notes (Signed)
Chest 2 view xray 12-07-12 epic ekg 12-07-12 epic Cardiac cath 10-02-12 epic lov note dr Jobie Quaker requested by voicemail not in epic

## 2012-12-29 ENCOUNTER — Ambulatory Visit (HOSPITAL_COMMUNITY)
Admission: RE | Admit: 2012-12-29 | Discharge: 2012-12-29 | Disposition: A | Discharge status: Home / Self Care | Payer: 59 | Source: Ambulatory Visit | Attending: Orthopedic Surgery | Admitting: Orthopedic Surgery

## 2012-12-29 ENCOUNTER — Encounter (HOSPITAL_COMMUNITY): Payer: Self-pay

## 2012-12-29 ENCOUNTER — Ambulatory Visit: Payer: 59 | Admitting: Rehabilitation

## 2012-12-29 ENCOUNTER — Encounter (HOSPITAL_COMMUNITY)
Admission: RE | Admit: 2012-12-29 | Discharge: 2012-12-29 | Disposition: A | Discharge status: Home / Self Care | Payer: 59 | Source: Ambulatory Visit | Attending: Specialist | Admitting: Specialist

## 2012-12-29 DIAGNOSIS — Z01812 Encounter for preprocedural laboratory examination: Secondary | ICD-10-CM | POA: Insufficient documentation

## 2012-12-29 DIAGNOSIS — M48061 Spinal stenosis, lumbar region without neurogenic claudication: Secondary | ICD-10-CM | POA: Insufficient documentation

## 2012-12-29 DIAGNOSIS — M5126 Other intervertebral disc displacement, lumbar region: Secondary | ICD-10-CM | POA: Insufficient documentation

## 2012-12-29 DIAGNOSIS — M519 Unspecified thoracic, thoracolumbar and lumbosacral intervertebral disc disorder: Secondary | ICD-10-CM | POA: Insufficient documentation

## 2012-12-29 DIAGNOSIS — M47817 Spondylosis without myelopathy or radiculopathy, lumbosacral region: Secondary | ICD-10-CM | POA: Insufficient documentation

## 2012-12-29 LAB — CBC
MCH: 26.8 pg (ref 26.0–34.0)
MCHC: 33.2 g/dL (ref 30.0–36.0)
Platelets: 307 10*3/uL (ref 150–400)
RBC: 4.82 MIL/uL (ref 3.87–5.11)
WBC: 7.7 10*3/uL (ref 4.0–10.5)

## 2012-12-29 LAB — SURGICAL PCR SCREEN: MRSA, PCR: NEGATIVE

## 2012-12-29 LAB — BASIC METABOLIC PANEL
BUN: 14 mg/dL (ref 6–23)
Calcium: 10.2 mg/dL (ref 8.4–10.5)
GFR calc non Af Amer: >90 mL/min (ref >90)
Glucose, Bld: 86 mg/dL (ref 70–99)
Sodium: 140 mEq/L (ref 135–145)

## 2012-12-29 NOTE — Progress Notes (Signed)
12/29/12 1305  OBSTRUCTIVE SLEEP APNEA  Have you ever been diagnosed with sleep apnea through a sleep study? No  Do you snore loudly (loud enough to be heard through closed doors)?  1  Do you often feel tired, fatigued, or sleepy during the daytime? 1  Has anyone observed you stop breathing during your sleep? 0  Do you have, or are you being treated for high blood pressure? 1  BMI more than 35 kg/m2? 1  Age over 63 years old? 1  Neck circumference greater than 40 cm/18 inches? 0  Gender: 0  Obstructive Sleep Apnea Score 5  Score 4 or greater  Results sent to PCP

## 2012-12-30 NOTE — Progress Notes (Signed)
Faxed  PCR to Dr Shelle Iron through Epic.  Patient states she began Mupirocin ointment last night

## 2012-12-31 ENCOUNTER — Ambulatory Visit: Payer: 59

## 2013-01-04 ENCOUNTER — Encounter: Payer: Self-pay | Admitting: Family Medicine

## 2013-01-04 ENCOUNTER — Other Ambulatory Visit: Payer: Self-pay | Admitting: Orthopedic Surgery

## 2013-01-04 ENCOUNTER — Ambulatory Visit: Payer: 59 | Admitting: Physical Therapy

## 2013-01-04 NOTE — H&P (Signed)
[name] is an 62 y.o. female.   Chief Complaint: back and leg pain HPI: The patient is a 62 year old female being followed for their low back symptoms. They are now 4 year(s) out from when symptoms began. Symptoms reported today include: pain, difficulty ambulating (has fallen many times), leg pain (left) and pain with standing. The patient states that they are doing poorly. Current treatment includes: relative rest, activity modification and pain medications. The following medication has been used for pain control: Percocet.  Past Medical History  Diagnosis Date  . Anemia   . Diabetes mellitus without complication   . Hypertension   . Hyperlipidemia LDL goal < 70   . CAD (coronary artery disease)     PCI to LAD in 2010, mild residual disease in LCX and RCA    Past Surgical History  Procedure Laterality Date  . Tympanoplasty Bilateral   . Carotid stent  2009  . Doppler echocardiography  08/01/2009    EF=>55%,LV normal  . Nm myocar perf wall motion  09/22/2008    lexiscan-EF 83%; glogal LV systolic fx is norm. ,evidence of mild ischemia basal anterior,midanterior and apical lateral region(s).   . Lower arterial duplex  06/20/10    abi's normal,rgt 0.98,lft 1.06;bilateral PVRs normal  . Abdominal hysterectomy    . Coronary angioplasty with stent placement  2010and 10-02-2012    Stent DES, Xience to prox. LAD    Family History  Problem Relation Age of Onset  . Coronary artery disease Mother   . Rheum arthritis Mother   . Heart attack Father   . Hypertension Father   . Hypertension Brother   . Cancer Paternal Grandmother     stomach   Social History:  reports that she has never smoked. She has never used smokeless tobacco. She reports that she drinks alcohol. She reports that she does not use illicit drugs.  Allergies:  Allergies  Allergen Reactions  . Niacin And Related Other (See Comments)    Whelps and skin flushed, mouth tingling     (Not in a hospital  admission)  No results found for this or any previous visit (from the past 48 hour(s)). No results found.  Review of Systems  Constitutional: Negative.   HENT: Negative.   Eyes: Negative.   Respiratory: Negative.   Cardiovascular: Negative.   Gastrointestinal: Negative.   Genitourinary: Negative.   Musculoskeletal: Positive for back pain.  Skin: Negative.   Neurological: Positive for sensory change and focal weakness.    There were no vitals taken for this visit. Physical Exam  Constitutional: She is oriented to person, place, and time. She appears well-developed and well-nourished.  HENT:  Head: Normocephalic and atraumatic.  Eyes: Conjunctivae and EOM are normal. Pupils are equal, round, and reactive to light.  Neck: Normal range of motion. Neck supple.  Cardiovascular: Normal rate and regular rhythm.   Respiratory: Effort normal and breath sounds normal.  GI: Soft. Bowel sounds are normal.  Musculoskeletal:  On exam, in moderate distress. Walks with an antalgic gait and a cane. Mood and affect are appropriate. SLR produces buttock, thigh and calf pain on the left, negative on the right. She has decreased sensation in the L5-S1 dermatome. Her EHL is 4/5. Plantar flexion is 4/5. Global limited ROM of the lumbar spine.  Neurological: She is alert and oriented to person, place, and time. She has normal reflexes.  Skin: Skin is warm and dry.  Psychiatric: She has a normal mood and affect.  MRI demonstrates severe lateral recess stenosis with compression of the 4-5 root. At 5-1, there is disc protrusion and displacement of the S1 nerve root.   Assessment/Plan HNP/stenosis L4-5  L5-S1 radiculopathy, myotomal weakness, dermatomal dysesthesias, neural tension signs with neural compressive lesion, particularly at 4-5 but also at 5-1, refractory to rest, activity modification and home stretching. Patient is currently anticoagulated.  Continue Percocet. We discussed  lumbar decompression at 2 levels, given the presence of a neurologic deficit. As we are proceeding for her surgical clearance, I would recommend having her seen down in physical therapy for positional modifications to determine if there is any benefit by positional modification; if not, perioperative instructions. She is falling, as well. I recommend utilization of a cane and gave her a prescription for that. She's had this chronically although she reports over the last couple of months an increased symptomatology. In fact, in 2010, she had similar 2-level pathology. She had a stent for cardiac disease and was unable to undergo surgical intervention. At this point she indicates she will be able to come off of her anticoagulant. She's been to the ER multiple times for this exacerbation, so I do feel it is appropriate to proceed at this point in time.  Risks and benefits of this procedure were discussed with the patient including worsening of symptoms, no changes in symptoms, recurrent disc herniation, scar tissue, epidural fibrosis, damage to neurovascular structures, cerebral spinal fluid leak which would require repair or patching, DVT, PE, anesthetic complications, etc. We discussed the perioperative course, the hospitalization, and the need for postoperative rehabilitation and the time estimate for recovery. We also discussed the possibility of future surgery including repeat decompression, fusion. The patient was provided an illustrated handout which was discussed in detail. Appropriate anatomic models were used as well.  She is on Plavix. She will come off the Plavix for 5 days and she will resume it 5 days following that. No recent infections.  Plan microlumbar decompression L4-5, possible L5-S1  Aalaya Yadao M.  For Dr. Shelle Iron  01/04/2013, 8:17 AM

## 2013-01-06 ENCOUNTER — Encounter (HOSPITAL_COMMUNITY): Payer: 59 | Admitting: Anesthesiology

## 2013-01-06 ENCOUNTER — Ambulatory Visit (HOSPITAL_COMMUNITY): Payer: 59

## 2013-01-06 ENCOUNTER — Ambulatory Visit (HOSPITAL_COMMUNITY)
Admission: RE | Admit: 2013-01-06 | Discharge: 2013-01-07 | Disposition: A | Discharge status: Home / Self Care | Payer: 59 | Source: Ambulatory Visit | Attending: Specialist | Admitting: Specialist

## 2013-01-06 ENCOUNTER — Ambulatory Visit (HOSPITAL_COMMUNITY): Payer: 59 | Admitting: Anesthesiology

## 2013-01-06 ENCOUNTER — Encounter (HOSPITAL_COMMUNITY)
Admission: RE | Disposition: A | Discharge status: Home / Self Care | Payer: Self-pay | Source: Ambulatory Visit | Attending: Specialist

## 2013-01-06 ENCOUNTER — Encounter (HOSPITAL_COMMUNITY): Payer: Self-pay | Admitting: Anesthesiology

## 2013-01-06 DIAGNOSIS — E119 Type 2 diabetes mellitus without complications: Secondary | ICD-10-CM | POA: Insufficient documentation

## 2013-01-06 DIAGNOSIS — M5126 Other intervertebral disc displacement, lumbar region: Secondary | ICD-10-CM | POA: Insufficient documentation

## 2013-01-06 DIAGNOSIS — E785 Hyperlipidemia, unspecified: Secondary | ICD-10-CM | POA: Insufficient documentation

## 2013-01-06 DIAGNOSIS — I2510 Atherosclerotic heart disease of native coronary artery without angina pectoris: Secondary | ICD-10-CM | POA: Insufficient documentation

## 2013-01-06 DIAGNOSIS — Z7902 Long term (current) use of antithrombotics/antiplatelets: Secondary | ICD-10-CM | POA: Insufficient documentation

## 2013-01-06 DIAGNOSIS — M48062 Spinal stenosis, lumbar region with neurogenic claudication: Secondary | ICD-10-CM

## 2013-01-06 DIAGNOSIS — I10 Essential (primary) hypertension: Secondary | ICD-10-CM | POA: Insufficient documentation

## 2013-01-06 HISTORY — PX: LUMBAR LAMINECTOMY/DECOMPRESSION MICRODISCECTOMY: SHX5026

## 2013-01-06 LAB — GLUCOSE, CAPILLARY
Glucose-Capillary: 106 mg/dL — ABNORMAL HIGH (ref 70–99)
Glucose-Capillary: 123 mg/dL — ABNORMAL HIGH (ref 70–99)

## 2013-01-06 SURGERY — LUMBAR LAMINECTOMY/DECOMPRESSION MICRODISCECTOMY 2 LEVELS
Anesthesia: General | Site: Back | Laterality: Left | Wound class: Clean

## 2013-01-06 MED ORDER — IRBESARTAN 75 MG PO TABS
75.0000 mg | ORAL_TABLET | Freq: Every day | ORAL | Status: DC
  Administered 2013-01-07: 75 mg via ORAL
  Filled 2013-01-06 (×2): qty 1

## 2013-01-06 MED ORDER — SODIUM CHLORIDE 0.9 % IJ SOLN: 3.0000 mL | INTRAMUSCULAR | Status: DC | PRN

## 2013-01-06 MED ORDER — CLINDAMYCIN PHOSPHATE 900 MG/50ML IV SOLN
900.0000 mg | Freq: Four times a day (QID) | INTRAVENOUS | Status: AC
  Administered 2013-01-06 (×2): 900 mg via INTRAVENOUS
  Filled 2013-01-06 (×2): qty 50

## 2013-01-06 MED ORDER — PHENYLEPHRINE HCL 10 MG/ML IJ SOLN
INTRAMUSCULAR | Status: DC | PRN
  Administered 2013-01-06: 80 ug via INTRAVENOUS
  Administered 2013-01-06 (×2): 40 ug via INTRAVENOUS
  Administered 2013-01-06: 80 ug via INTRAVENOUS
  Administered 2013-01-06: 20 ug via INTRAVENOUS
  Administered 2013-01-06 (×2): 80 ug via INTRAVENOUS
  Administered 2013-01-06: 40 ug via INTRAVENOUS
  Administered 2013-01-06 (×3): 80 ug via INTRAVENOUS
  Administered 2013-01-06: 40 ug via INTRAVENOUS

## 2013-01-06 MED ORDER — PROMETHAZINE HCL 25 MG/ML IJ SOLN: 6.2500 mg | INTRAMUSCULAR | Status: DC | PRN

## 2013-01-06 MED ORDER — LACTATED RINGERS IV SOLN: INTRAVENOUS | Status: DC

## 2013-01-06 MED ORDER — BUPIVACAINE-EPINEPHRINE PF 0.5-1:200000 % IJ SOLN
INTRAMUSCULAR | Status: AC
  Filled 2013-01-06: qty 30

## 2013-01-06 MED ORDER — ONDANSETRON HCL 4 MG/2ML IJ SOLN: 4.0000 mg | INTRAMUSCULAR | Status: DC | PRN

## 2013-01-06 MED ORDER — CHLORHEXIDINE GLUCONATE 4 % EX LIQD: 60.0000 mL | Freq: Once | CUTANEOUS | Status: DC

## 2013-01-06 MED ORDER — PIOGLITAZONE HCL-METFORMIN HCL 15-500 MG PO TABS: 1.0000 | ORAL_TABLET | Freq: Two times a day (BID) | ORAL | Status: DC

## 2013-01-06 MED ORDER — CEFAZOLIN SODIUM-DEXTROSE 2-3 GM-% IV SOLR
2.0000 g | Freq: Three times a day (TID) | INTRAVENOUS | Status: AC
  Administered 2013-01-06 – 2013-01-07 (×3): 2 g via INTRAVENOUS
  Filled 2013-01-06 (×3): qty 50

## 2013-01-06 MED ORDER — PROPOFOL 10 MG/ML IV BOLUS
INTRAVENOUS | Status: DC | PRN
  Administered 2013-01-06: 150 mg via INTRAVENOUS
  Administered 2013-01-06: 50 mg via INTRAVENOUS

## 2013-01-06 MED ORDER — CEFAZOLIN SODIUM-DEXTROSE 2-3 GM-% IV SOLR
INTRAVENOUS | Status: AC
  Filled 2013-01-06: qty 50

## 2013-01-06 MED ORDER — EPHEDRINE SULFATE 50 MG/ML IJ SOLN
INTRAMUSCULAR | Status: DC | PRN
  Administered 2013-01-06 (×2): 5 mg via INTRAVENOUS
  Administered 2013-01-06 (×2): 10 mg via INTRAVENOUS
  Administered 2013-01-06: 5 mg via INTRAVENOUS
  Administered 2013-01-06: 10 mg via INTRAVENOUS

## 2013-01-06 MED ORDER — NEOSTIGMINE METHYLSULFATE 1 MG/ML IJ SOLN
INTRAMUSCULAR | Status: DC | PRN
  Administered 2013-01-06: 5 mg via INTRAVENOUS

## 2013-01-06 MED ORDER — BUPIVACAINE-EPINEPHRINE PF 0.25-1:200000 % IJ SOLN
INTRAMUSCULAR | Status: AC
  Filled 2013-01-06: qty 30

## 2013-01-06 MED ORDER — CLINDAMYCIN PHOSPHATE 900 MG/50ML IV SOLN
INTRAVENOUS | Status: AC
  Filled 2013-01-06: qty 50

## 2013-01-06 MED ORDER — PIOGLITAZONE HCL 15 MG PO TABS
15.0000 mg | ORAL_TABLET | Freq: Two times a day (BID) | ORAL | Status: DC
  Administered 2013-01-06 – 2013-01-07 (×2): 15 mg via ORAL
  Filled 2013-01-06 (×4): qty 1

## 2013-01-06 MED ORDER — DIAZEPAM 2 MG PO TABS: 2.0000 mg | ORAL_TABLET | Freq: Three times a day (TID) | ORAL | Status: DC | PRN

## 2013-01-06 MED ORDER — FENTANYL CITRATE 0.05 MG/ML IJ SOLN
INTRAMUSCULAR | Status: DC | PRN
  Administered 2013-01-06 (×2): 50 ug via INTRAVENOUS

## 2013-01-06 MED ORDER — MENTHOL 3 MG MT LOZG: 1.0000 | LOZENGE | OROMUCOSAL | Status: DC | PRN

## 2013-01-06 MED ORDER — ACETAMINOPHEN 650 MG RE SUPP: 650.0000 mg | RECTAL | Status: DC | PRN

## 2013-01-06 MED ORDER — OXYCODONE-ACETAMINOPHEN 5-325 MG PO TABS: 1.0000 | ORAL_TABLET | ORAL | Status: DC | PRN

## 2013-01-06 MED ORDER — ONDANSETRON HCL 4 MG PO TABS: 8.0000 mg | ORAL_TABLET | Freq: Three times a day (TID) | ORAL | Status: DC | PRN

## 2013-01-06 MED ORDER — BUPIVACAINE-EPINEPHRINE 0.5% -1:200000 IJ SOLN
INTRAMUSCULAR | Status: DC | PRN
  Administered 2013-01-06: 15 mg

## 2013-01-06 MED ORDER — ONDANSETRON HCL 4 MG/2ML IJ SOLN
INTRAMUSCULAR | Status: DC | PRN
  Administered 2013-01-06: 4 mg via INTRAMUSCULAR

## 2013-01-06 MED ORDER — LACTATED RINGERS IV SOLN
INTRAVENOUS | Status: DC
  Administered 2013-01-06 (×2): via INTRAVENOUS

## 2013-01-06 MED ORDER — POTASSIUM CHLORIDE CRYS ER 20 MEQ PO TBCR
20.0000 meq | EXTENDED_RELEASE_TABLET | Freq: Every day | ORAL | Status: DC
  Administered 2013-01-07: 20 meq via ORAL
  Filled 2013-01-06 (×2): qty 1

## 2013-01-06 MED ORDER — CLINDAMYCIN PHOSPHATE 900 MG/50ML IV SOLN
900.0000 mg | INTRAVENOUS | Status: AC
  Administered 2013-01-06: 900 mg via INTRAVENOUS

## 2013-01-06 MED ORDER — SODIUM CHLORIDE 0.9 % IJ SOLN: 3.0000 mL | Freq: Two times a day (BID) | INTRAMUSCULAR | Status: DC

## 2013-01-06 MED ORDER — LIDOCAINE HCL (CARDIAC) 20 MG/ML IV SOLN
INTRAVENOUS | Status: DC | PRN
  Administered 2013-01-06: 80 mg via INTRAVENOUS

## 2013-01-06 MED ORDER — HYDROCODONE-ACETAMINOPHEN 5-325 MG PO TABS
1.0000 | ORAL_TABLET | ORAL | Status: DC | PRN
  Administered 2013-01-06 – 2013-01-07 (×4): 2 via ORAL
  Filled 2013-01-06 (×4): qty 2

## 2013-01-06 MED ORDER — PHENOL 1.4 % MT LIQD: 1.0000 | OROMUCOSAL | Status: DC | PRN

## 2013-01-06 MED ORDER — THROMBIN 5000 UNITS EX SOLR
CUTANEOUS | Status: AC
  Filled 2013-01-06: qty 10000

## 2013-01-06 MED ORDER — FAMOTIDINE 10 MG PO TABS
10.0000 mg | ORAL_TABLET | Freq: Two times a day (BID) | ORAL | Status: DC
  Administered 2013-01-06 – 2013-01-07 (×2): 10 mg via ORAL
  Filled 2013-01-06 (×3): qty 1

## 2013-01-06 MED ORDER — INSULIN ASPART 100 UNIT/ML ~~LOC~~ SOLN
0.0000 [IU] | Freq: Three times a day (TID) | SUBCUTANEOUS | Status: DC
  Administered 2013-01-06: 2 [IU] via SUBCUTANEOUS

## 2013-01-06 MED ORDER — HYDROMORPHONE HCL PF 1 MG/ML IJ SOLN: 0.2500 mg | INTRAMUSCULAR | Status: DC | PRN

## 2013-01-06 MED ORDER — DOCUSATE SODIUM 100 MG PO CAPS
100.0000 mg | ORAL_CAPSULE | Freq: Two times a day (BID) | ORAL | Status: DC
  Administered 2013-01-06 – 2013-01-07 (×2): 100 mg via ORAL

## 2013-01-06 MED ORDER — HYDROMORPHONE HCL PF 1 MG/ML IJ SOLN: 0.5000 mg | INTRAMUSCULAR | Status: DC | PRN

## 2013-01-06 MED ORDER — HYDROCHLOROTHIAZIDE 25 MG PO TABS
25.0000 mg | ORAL_TABLET | Freq: Every morning | ORAL | Status: DC
  Administered 2013-01-07: 25 mg via ORAL
  Filled 2013-01-06 (×2): qty 1

## 2013-01-06 MED ORDER — CYCLOBENZAPRINE HCL 10 MG PO TABS: 10.0000 mg | ORAL_TABLET | Freq: Three times a day (TID) | ORAL | Status: DC | PRN

## 2013-01-06 MED ORDER — THROMBIN 5000 UNITS EX SOLR
CUTANEOUS | Status: DC | PRN
  Administered 2013-01-06: 10000 [IU] via TOPICAL

## 2013-01-06 MED ORDER — ACETAMINOPHEN 325 MG PO TABS
650.0000 mg | ORAL_TABLET | ORAL | Status: DC | PRN
  Administered 2013-01-06: 650 mg via ORAL
  Filled 2013-01-06: qty 2

## 2013-01-06 MED ORDER — SODIUM CHLORIDE 0.9 % IR SOLN
Status: DC | PRN
  Administered 2013-01-06: 09:00:00

## 2013-01-06 MED ORDER — METFORMIN HCL 500 MG PO TABS
500.0000 mg | ORAL_TABLET | Freq: Two times a day (BID) | ORAL | Status: DC
  Administered 2013-01-06 – 2013-01-07 (×2): 500 mg via ORAL
  Filled 2013-01-06 (×4): qty 1

## 2013-01-06 MED ORDER — ROCURONIUM BROMIDE 100 MG/10ML IV SOLN
INTRAVENOUS | Status: DC | PRN
  Administered 2013-01-06 (×2): 5 mg via INTRAVENOUS
  Administered 2013-01-06: 50 mg via INTRAVENOUS
  Administered 2013-01-06: 10 mg via INTRAVENOUS
  Administered 2013-01-06: 5 mg via INTRAVENOUS

## 2013-01-06 MED ORDER — POTASSIUM CHLORIDE IN NACL 20-0.9 MEQ/L-% IV SOLN
INTRAVENOUS | Status: DC
  Administered 2013-01-06: 15:00:00 via INTRAVENOUS
  Filled 2013-01-06 (×2): qty 1000

## 2013-01-06 MED ORDER — CYCLOBENZAPRINE HCL 10 MG PO TABS
10.0000 mg | ORAL_TABLET | Freq: Two times a day (BID) | ORAL | Status: DC | PRN
Start: 2013-01-06 — End: 2013-01-07
  Administered 2013-01-06 – 2013-01-07 (×2): 10 mg via ORAL
  Filled 2013-01-06 (×2): qty 1

## 2013-01-06 MED ORDER — CEFAZOLIN SODIUM-DEXTROSE 2-3 GM-% IV SOLR
2.0000 g | INTRAVENOUS | Status: AC
  Administered 2013-01-06: 2 g via INTRAVENOUS

## 2013-01-06 MED ORDER — OXYCODONE-ACETAMINOPHEN 7.5-325 MG PO TABS: 1.0000 | ORAL_TABLET | ORAL | Status: DC | PRN

## 2013-01-06 MED ORDER — MIDAZOLAM HCL 5 MG/5ML IJ SOLN
INTRAMUSCULAR | Status: DC | PRN
  Administered 2013-01-06 (×2): 2 mg via INTRAVENOUS

## 2013-01-06 MED ORDER — BACITRACIN-NEOMYCIN-POLYMYXIN 400-5-5000 EX OINT
TOPICAL_OINTMENT | CUTANEOUS | Status: AC
  Filled 2013-01-06: qty 1

## 2013-01-06 MED ORDER — GLYCOPYRROLATE 0.2 MG/ML IJ SOLN
INTRAMUSCULAR | Status: DC | PRN
  Administered 2013-01-06: .6 mg via INTRAVENOUS

## 2013-01-06 MED ORDER — ZOLPIDEM TARTRATE 5 MG PO TABS: 5.0000 mg | ORAL_TABLET | Freq: Every evening | ORAL | Status: DC | PRN

## 2013-01-06 SURGICAL SUPPLY — 56 items
APL SKNCLS STERI-STRIP NONHPOA (GAUZE/BANDAGES/DRESSINGS) ×2
BAG SPEC THK2 15X12 ZIP CLS (MISCELLANEOUS) ×1
BAG ZIPLOCK 12X15 (MISCELLANEOUS) ×2 IMPLANT
BENZOIN TINCTURE PRP APPL 2/3 (GAUZE/BANDAGES/DRESSINGS) ×4 IMPLANT
CHLORAPREP W/TINT 26ML (MISCELLANEOUS) IMPLANT
CLEANER TIP ELECTROSURG 2X2 (MISCELLANEOUS) ×2 IMPLANT
CLOTH 2% CHLOROHEXIDINE 3PK (PERSONAL CARE ITEMS) ×2 IMPLANT
CLOTH BEACON ORANGE TIMEOUT ST (SAFETY) ×2 IMPLANT
DECANTER SPIKE VIAL GLASS SM (MISCELLANEOUS) ×2 IMPLANT
DRAPE MICROSCOPE LEICA (MISCELLANEOUS) ×2 IMPLANT
DRAPE POUCH INSTRU U-SHP 10X18 (DRAPES) ×2 IMPLANT
DRAPE SURG 17X11 SM STRL (DRAPES) ×2 IMPLANT
DRAPE UTILITY XL STRL (DRAPES) ×1 IMPLANT
DRSG AQUACEL AG ADV 3.5X 6 (GAUZE/BANDAGES/DRESSINGS) ×1 IMPLANT
DRSG EMULSION OIL 3X3 NADH (GAUZE/BANDAGES/DRESSINGS) IMPLANT
DRSG PAD ABDOMINAL 8X10 ST (GAUZE/BANDAGES/DRESSINGS) IMPLANT
DURAPREP 26ML APPLICATOR (WOUND CARE) ×2 IMPLANT
DURASEAL SPINE SEALANT 3ML (MISCELLANEOUS) ×2 IMPLANT
ELECT REM PT RETURN 9FT ADLT (ELECTROSURGICAL) ×2
ELECTRODE REM PT RTRN 9FT ADLT (ELECTROSURGICAL) ×1 IMPLANT
GLOVE BIOGEL PI IND STRL 7.5 (GLOVE) ×1 IMPLANT
GLOVE BIOGEL PI IND STRL 8 (GLOVE) ×1 IMPLANT
GLOVE BIOGEL PI INDICATOR 7.5 (GLOVE) ×1
GLOVE BIOGEL PI INDICATOR 8 (GLOVE) ×1
GLOVE SURG SS PI 7.5 STRL IVOR (GLOVE) ×2 IMPLANT
GLOVE SURG SS PI 8.0 STRL IVOR (GLOVE) ×4 IMPLANT
GOWN PREVENTION PLUS LG XLONG (DISPOSABLE) ×2 IMPLANT
GOWN STRL REIN XL XLG (GOWN DISPOSABLE) ×4 IMPLANT
IV CATH 14GX2 1/4 (CATHETERS) ×2 IMPLANT
KIT BASIN OR (CUSTOM PROCEDURE TRAY) ×2 IMPLANT
KIT POSITIONING SURG ANDREWS (MISCELLANEOUS) ×2 IMPLANT
MANIFOLD NEPTUNE II (INSTRUMENTS) ×2 IMPLANT
NDL SPNL 18GX3.5 QUINCKE PK (NEEDLE) ×3 IMPLANT
NEEDLE SPNL 18GX3.5 QUINCKE PK (NEEDLE) ×6 IMPLANT
PATTIES SURGICAL .5 X.5 (GAUZE/BANDAGES/DRESSINGS) IMPLANT
PATTIES SURGICAL .75X.75 (GAUZE/BANDAGES/DRESSINGS) IMPLANT
PATTIES SURGICAL 1X1 (DISPOSABLE) IMPLANT
SPONGE SURGIFOAM ABS GEL 100 (HEMOSTASIS) ×2 IMPLANT
STAPLER VISISTAT (STAPLE) IMPLANT
STRIP CLOSURE SKIN 1/2X4 (GAUZE/BANDAGES/DRESSINGS) IMPLANT
SUT NURALON 4 0 TR CR/8 (SUTURE) ×2 IMPLANT
SUT PROLENE 3 0 PS 2 (SUTURE) IMPLANT
SUT VIC AB 0 CT1 27 (SUTURE)
SUT VIC AB 0 CT1 27XBRD ANTBC (SUTURE) IMPLANT
SUT VIC AB 1 CT1 27 (SUTURE) ×2
SUT VIC AB 1 CT1 27XBRD ANTBC (SUTURE) ×1 IMPLANT
SUT VIC AB 1-0 CT2 27 (SUTURE) IMPLANT
SUT VIC AB 2-0 CT1 27 (SUTURE) ×2
SUT VIC AB 2-0 CT1 TAPERPNT 27 (SUTURE) ×1 IMPLANT
SUT VIC AB 2-0 CT2 27 (SUTURE) ×4 IMPLANT
SUT VICRYL 0 27 CT2 27 ABS (SUTURE) ×4 IMPLANT
SUT VICRYL 0 UR6 27IN ABS (SUTURE) IMPLANT
SYRINGE 10CC LL (SYRINGE) ×4 IMPLANT
TOWEL OR 17X26 10 PK STRL BLUE (TOWEL DISPOSABLE) ×1 IMPLANT
TRAY LAMINECTOMY (CUSTOM PROCEDURE TRAY) ×2 IMPLANT
YANKAUER SUCT BULB TIP NO VENT (SUCTIONS) ×2 IMPLANT

## 2013-01-06 NOTE — Interval H&P Note (Signed)
History and Physical Interval Note:  01/06/2013 8:27 AM  [name]  has presented today for surgery, with the diagnosis of STENOSIS/HNP L4-5  The various methods of treatment have been discussed with the patient and family. After consideration of risks, benefits and other options for treatment, the patient has consented to  Procedure(s): MICRO LUMBAR DECOMPRESSION L4-5/POSSIBLE L5-S1 (N/A) as a surgical intervention .  The patient's history has been reviewed, patient examined, no change in status, stable for surgery.  I have reviewed the patient's chart and labs.  Questions were answered to the patient's satisfaction.     Albany Winslow C

## 2013-01-06 NOTE — Anesthesia Postprocedure Evaluation (Signed)
Anesthesia Post Note  Patient: [name]  Procedure(s) Performed: Procedure(s) (LRB): MICRO LUMBAR DECOMPRESSION L4-5 AND L5-S1 (Left)  Anesthesia type: General  Patient location: PACU  Post pain: Pain level controlled  Post assessment: Post-op Vital signs reviewed  Last Vitals:  Filed Vitals:   01/06/13 1445  BP: 102/62  Pulse: 61  Temp: 36.4 C  Resp: 16    Post vital signs: Reviewed  Level of consciousness: sedated  Complications: No apparent anesthesia complications

## 2013-01-06 NOTE — Brief Op Note (Signed)
01/06/2013  10:45 AM  PATIENT:  [name]  62 y.o. female  PRE-OPERATIVE DIAGNOSIS:  STENOSIS/HNP L4-5  POST-OPERATIVE DIAGNOSIS:  STENOSIS/HNP L4-5  PROCEDURE:  Procedure(s): MICRO LUMBAR DECOMPRESSION L4-5 AND L5-S1 (Left)  SURGEON:  Surgeon(s) and Role:    * Javier Docker, MD - Primary  PHYSICIAN ASSISTANT:   ASSISTANTS: Bissell   ANESTHESIA:   general  EBL:  Total I/O In: 1000 [I.V.:1000] Out: 540 [Urine:390; Blood:150]  BLOOD ADMINISTERED:none  DRAINS: none   LOCAL MEDICATIONS USED:  MARCAINE     SPECIMEN:  Source of Specimen:  L45  DISPOSITION OF SPECIMEN:  PATHOLOGY  COUNTS:  YES  TOURNIQUET:  * No tourniquets in log *  DICTATION: .Other Dictation: Dictation Number  [id number]  PLAN OF CARE: Admit for overnight observation  PATIENT DISPOSITION:  PACU - hemodynamically stable.   Delay start of Pharmacological VTE agent (>24hrs) due to surgical blood loss or risk of bleeding: yes

## 2013-01-06 NOTE — Plan of Care (Signed)
Problem: Consults Goal: Diagnosis - Spinal Surgery Decompression lumbarlaminectomy l4-l5

## 2013-01-06 NOTE — Preoperative (Addendum)
Beta Blockers   Reason not to administer Beta Blockers:Not Applicable 

## 2013-01-06 NOTE — Anesthesia Preprocedure Evaluation (Signed)
Anesthesia Evaluation  Patient identified by MRN, date of birth, ID band Patient awake    Reviewed: Allergy & Precautions, H&P , NPO status , Patient's Chart, lab work & pertinent test results  Airway Mallampati: II TM Distance: >3 FB Neck ROM: Full    Dental  (+) Teeth Intact and Dental Advisory Given   Pulmonary neg pulmonary ROS,  breath sounds clear to auscultation  Pulmonary exam normal       Cardiovascular hypertension, + CAD and + Cardiac Stents Rhythm:Regular Rate:Normal     Neuro/Psych negative neurological ROS  negative psych ROS   GI/Hepatic negative GI ROS, Neg liver ROS,   Endo/Other  diabetes, Type 2, Oral Hypoglycemic AgentsMorbid obesity  Renal/GU negative Renal ROS  negative genitourinary   Musculoskeletal negative musculoskeletal ROS (+)   Abdominal   Peds  Hematology negative hematology ROS (+)   Anesthesia Other Findings   Reproductive/Obstetrics                           Anesthesia Physical Anesthesia Plan  ASA: III  Anesthesia Plan: General   Post-op Pain Management:    Induction: Intravenous  Airway Management Planned: Oral ETT  Additional Equipment:   Intra-op Plan:   Post-operative Plan: Extubation in OR  Informed Consent: I have reviewed the patients History and Physical, chart, labs and discussed the procedure including the risks, benefits and alternatives for the proposed anesthesia with the patient or authorized representative who has indicated his/her understanding and acceptance.   Dental advisory given  Plan Discussed with: CRNA  Anesthesia Plan Comments:         Anesthesia Quick Evaluation

## 2013-01-06 NOTE — Op Note (Signed)
NAMEDIAMANTE, TRUSZKOWSKI                  ACCOUNT NO.:  [id number]  MEDICAL RECORD NO.:  [id number]  LOCATION:  1621                         FACILITY:  [facility]  PHYSICIAN:  Jene Every, M.D.    DATE OF BIRTH:  02/20/1951  DATE OF PROCEDURE:  01/06/2013 DATE OF DISCHARGE:                              OPERATIVE REPORT   PREOPERATIVE DIAGNOSES:  Spinal stenosis, herniated nucleus pulposus at L4-5 and L5-S1.  POSTOPERATIVE DIAGNOSIS:  Spinal stenosis, herniated nucleus pulposus at L4-5 and L5-S1.  PROCEDURES PERFORMED: 1. Microlumbar decompression at L4-5 and L5-S1. 2. Foraminotomies of L4, L5, and S1, left. 3. Microdiskectomy at L4-5, left. 4. Lysis of epidural venous plexus at L4-5 and L5-S1.  ANESTHESIA:  General.  ASSISTANT:  Lanna Poche, PA.  HISTORY:  This is a 62 year old female who has had severe left lower extremity radicular pain, L5-S1 nerve root distribution, neural tension signs, EHL weakness, diminished plantar flexion.  More recently over the past few weeks, she does experiencing S1 dermatomal plane.  She has stenosis in the lateral recess at L5-S1 and at L4-5.  She had temporary relief from injection, had EHL weakness and failed conservative treatment and was indicated for decompression.  She is a NICU nurse and we felt prudent to proceed with preoperative decolonization as well as the administration of clindamycin with Kefzol.  We discussed the decompression.  Risk and benefits were discussed including bleeding, infection, damage to neurovascular structure, DVT, PE, anesthetic complications, etc.  TECHNIQUE:  With the patient in supine position, after induction of adequate general anesthesia, 2 g Kefzol and 900 mg of clindamycin, she was placed prone on the Andrews frame, Foley to gravity.  Lumbar region was prepped and draped in usual sterile fashion.  Two 18-gauge spinal needles were utilized to localize the L4-5 and L5-S1, interspace, which was confirmed  by x-ray.  Radiologic initial labelling was of transitional segment.  We confirmed with Radiology.  The lower two disk spaces were consistent without seen on the previous MRI and her symptomatic space.  Therefore, labeled the lower two disk spaces at L4-5 and L5-S1.  Incision was made from above the spinous process of L4 to below S1.  Subcutaneous tissue was dissected.  Electrocautery was utilized to achieve hemostasis.  Dorsolumbar fascia was identified, divided in line with skin incision.  Paraspinous muscle was elevated from lamina of L4-5 and L5-S1.  Operating microscope was draped and brought on the surgical field.  Confirmatory radiograph was obtained, used a Leksell rongeur to remove the portion of the hemilamina of L5 and of L4.  We then proceeded first at L4-5 and performed the hemilaminotomy in the caudad edge of L4 utilizing 2 and 3-mm Kerrison detaching the ligamentum flavum, which was severe compressing the lateral recess. Just prior to that, there was a hypertrophic facet that precluded entering the interspace and therefore, I used a micro-osteotome to perform a partial medial hemifacetectomy of the inferior aspect of the L5, approximately 30% of the medial aspect of that was excised.  The superior articulating process of L5 was then identified and we entered the lateral recess with a straight microcurette.  Following this, I then performed the generous  foraminotomy of L5 and then decompressed the lateral recess from the medial border of the pedicle performing a foraminotomy of L4 as well, both area were severely stenotic, and we identified the L4 root as well as the L5 root and the large HNP partially calcified was noted laterally and towards the midline displacing the L5 root.  With the neural elements well protected for identification by x-ray, we then performed a foraminotomy of L5 as well. This tenting of the L5 root.  I removed the entire hemilamina of L5 to fully  decompress the L5 root and then, we extended down into the L5-S1. Hypertrophic ligamentum and facet were noted here as well and we therefore decompressed the lateral recess, which was fairly stenotic to the medial border of the pedicle to performing a foraminotomy of S1 as well.  There was no herniation of the disk space at L5-S1 and there was epidural venous plexus, which was cauterized.  Next, I performed annulotomy and herniation at L4-5 and copious portion of disk material was removed from disk space and straight upbiting pituitary to further mobilize with an Epstein.  Again, there were some portion of this that was a hardened disk.  We removed ligamentum flavum again from the interspace protecting the L5 roots bilaterally.  Bipolar electrocautery was used to achieve hemostasis.  We obtained the confirmatory radiograph with Penfield in the disk space and down the foramen of L5 and in the disk space at L5-S1.  I copiously irrigated the disk space with antibiotic irrigation and there was no further disk herniation noted beneath thecal sac at the foramen of L4 or L5, beneath the L5 root, caudad or cephalad.  Good restoration of thecal sac was noted and no CSF leakage or active bleeding was noted.  We then continued down to further following the S1 nerve root, ensuring it was widely patent following the foraminotomy.  There was no disk herniation at L5-S1 noted.  We spent considerable time achieving meticulous hemostasis with bipolar electrocautery as well as with thrombin-soaked Gelfoam, this had been achieved.  1 cm excursion of the L5 root medial to pedicle without tension as well as the S1 nerve root.  Removed the ligamentum flavum from the interspace to the midline of the spinous processes and felt there was excellent decompression of all three nerve roots.  I then copiously irrigated the wound.  Inspection revealed no CSF leaks or active bleeding and removed the [facility] retractor.   Paraspinous muscle was inspected and no evidence of active bleeding.  I copiously irrigated and then repaired the fascia with #1 Vicryl interrupted figure- of-eight sutures, subcu with 2-0, skin was reapproximated with staples. Wound was dressed sterilely.  Placed supine on the hospital bed, extubated without difficulty, and transported to the recovery room in satisfactory condition.  The patient tolerated the procedure well.  No complications.  Assistant, Lanna Poche, [place].  Minimal blood loss.     Jene Every, M.D.     Cordelia Pen  D:  01/06/2013  T:  01/06/2013  Job:  [id number]

## 2013-01-06 NOTE — Transfer of Care (Signed)
Immediate Anesthesia Transfer of Care Note  Patient: [name]  Procedure(s) Performed: Procedure(s): MICRO LUMBAR DECOMPRESSION L4-5 AND L5-S1 (Left)  Patient Location: PACU  Anesthesia Type:General  Level of Consciousness: awake, alert  and oriented  Airway & Oxygen Therapy: Patient Spontanous Breathing and Patient connected to face mask oxygen  Post-op Assessment: Report given to PACU RN and Post -op Vital signs reviewed and stable  Post vital signs: Reviewed and stable  Complications: No apparent anesthesia complications

## 2013-01-06 NOTE — H&P (View-Only) (Signed)
[name] is an 62 y.o. female.   Chief Complaint: back and leg pain HPI: The patient is a 62 year old female being followed for their low back symptoms. They are now 4 year(s) out from when symptoms began. Symptoms reported today include: pain, difficulty ambulating (has fallen many times), leg pain (left) and pain with standing. The patient states that they are doing poorly. Current treatment includes: relative rest, activity modification and pain medications. The following medication has been used for pain control: Percocet.  Past Medical History  Diagnosis Date  . Anemia   . Diabetes mellitus without complication   . Hypertension   . Hyperlipidemia LDL goal < 70   . CAD (coronary artery disease)     PCI to LAD in 2010, mild residual disease in LCX and RCA    Past Surgical History  Procedure Laterality Date  . Tympanoplasty Bilateral   . Carotid stent  2009  . Doppler echocardiography  08/01/2009    EF=>55%,LV normal  . Nm myocar perf wall motion  09/22/2008    lexiscan-EF 83%; glogal LV systolic fx is norm. ,evidence of mild ischemia basal anterior,midanterior and apical lateral region(s).   . Lower arterial duplex  06/20/10    abi's normal,rgt 0.98,lft 1.06;bilateral PVRs normal  . Abdominal hysterectomy    . Coronary angioplasty with stent placement  2010and 10-02-2012    Stent DES, Xience to prox. LAD    Family History  Problem Relation Age of Onset  . Coronary artery disease Mother   . Rheum arthritis Mother   . Heart attack Father   . Hypertension Father   . Hypertension Brother   . Cancer Paternal Grandmother     stomach   Social History:  reports that she has never smoked. She has never used smokeless tobacco. She reports that she drinks alcohol. She reports that she does not use illicit drugs.  Allergies:  Allergies  Allergen Reactions  . Niacin And Related Other (See Comments)    Whelps and skin flushed, mouth tingling     (Not in a hospital  admission)  No results found for this or any previous visit (from the past 48 hour(s)). No results found.  Review of Systems  Constitutional: Negative.   HENT: Negative.   Eyes: Negative.   Respiratory: Negative.   Cardiovascular: Negative.   Gastrointestinal: Negative.   Genitourinary: Negative.   Musculoskeletal: Positive for back pain.  Skin: Negative.   Neurological: Positive for sensory change and focal weakness.    There were no vitals taken for this visit. Physical Exam  Constitutional: She is oriented to person, place, and time. She appears well-developed and well-nourished.  HENT:  Head: Normocephalic and atraumatic.  Eyes: Conjunctivae and EOM are normal. Pupils are equal, round, and reactive to light.  Neck: Normal range of motion. Neck supple.  Cardiovascular: Normal rate and regular rhythm.   Respiratory: Effort normal and breath sounds normal.  GI: Soft. Bowel sounds are normal.  Musculoskeletal:  On exam, in moderate distress. Walks with an antalgic gait and a cane. Mood and affect are appropriate. SLR produces buttock, thigh and calf pain on the left, negative on the right. She has decreased sensation in the L5-S1 dermatome. Her EHL is 4/5. Plantar flexion is 4/5. Global limited ROM of the lumbar spine.  Neurological: She is alert and oriented to person, place, and time. She has normal reflexes.  Skin: Skin is warm and dry.  Psychiatric: She has a normal mood and affect.      MRI demonstrates severe lateral recess stenosis with compression of the 4-5 root. At 5-1, there is disc protrusion and displacement of the S1 nerve root.   Assessment/Plan HNP/stenosis L4-5  L5-S1 radiculopathy, myotomal weakness, dermatomal dysesthesias, neural tension signs with neural compressive lesion, particularly at 4-5 but also at 5-1, refractory to rest, activity modification and home stretching. Patient is currently anticoagulated.  Continue Percocet. We discussed  lumbar decompression at 2 levels, given the presence of a neurologic deficit. As we are proceeding for her surgical clearance, I would recommend having her seen down in physical therapy for positional modifications to determine if there is any benefit by positional modification; if not, perioperative instructions. She is falling, as well. I recommend utilization of a cane and gave her a prescription for that. She's had this chronically although she reports over the last couple of months an increased symptomatology. In fact, in 2010, she had similar 2-level pathology. She had a stent for cardiac disease and was unable to undergo surgical intervention. At this point she indicates she will be able to come off of her anticoagulant. She's been to the ER multiple times for this exacerbation, so I do feel it is appropriate to proceed at this point in time.  Risks and benefits of this procedure were discussed with the patient including worsening of symptoms, no changes in symptoms, recurrent disc herniation, scar tissue, epidural fibrosis, damage to neurovascular structures, cerebral spinal fluid leak which would require repair or patching, DVT, PE, anesthetic complications, etc. We discussed the perioperative course, the hospitalization, and the need for postoperative rehabilitation and the time estimate for recovery. We also discussed the possibility of future surgery including repeat decompression, fusion. The patient was provided an illustrated handout which was discussed in detail. Appropriate anatomic models were used as well.  She is on Plavix. She will come off the Plavix for 5 days and she will resume it 5 days following that. No recent infections.  Plan microlumbar decompression L4-5, possible L5-S1  BISSELL, JACLYN M.  For Dr. Beane  01/04/2013, 8:17 AM    

## 2013-01-07 ENCOUNTER — Encounter (HOSPITAL_COMMUNITY): Payer: Self-pay | Admitting: Specialist

## 2013-01-07 LAB — CBC WITH DIFFERENTIAL/PLATELET
Eosinophils Absolute: 0.2 10*3/uL (ref 0.0–0.7)
Eosinophils Relative: 3 % (ref 0–5)
Hemoglobin: 10.8 g/dL — ABNORMAL LOW (ref 12.0–15.0)
Lymphs Abs: 2.4 10*3/uL (ref 0.7–4.0)
MCH: 25.8 pg — ABNORMAL LOW (ref 26.0–34.0)
MCV: 80.7 fL (ref 78.0–100.0)
Monocytes Absolute: 0.9 10*3/uL (ref 0.1–1.0)
Monocytes Relative: 11 % (ref 3–12)
Neutrophils Relative %: 56 % (ref 43–77)
RBC: 4.19 MIL/uL (ref 3.87–5.11)

## 2013-01-07 LAB — BASIC METABOLIC PANEL
BUN: 11 mg/dL (ref 6–23)
CO2: 27 mEq/L (ref 19–32)
GFR calc non Af Amer: >90 mL/min (ref >90)
Glucose, Bld: 117 mg/dL — ABNORMAL HIGH (ref 70–99)
Potassium: 3.6 mEq/L (ref 3.5–5.1)

## 2013-01-07 LAB — GLUCOSE, CAPILLARY

## 2013-01-07 MED ORDER — CLOPIDOGREL BISULFATE 75 MG PO TABS: 75.0000 mg | ORAL_TABLET | Freq: Every morning | ORAL | Status: DC

## 2013-01-07 MED ORDER — ASPIRIN EC 81 MG PO TBEC: 81.0000 mg | DELAYED_RELEASE_TABLET | Freq: Every day | ORAL | Status: DC

## 2013-01-07 NOTE — Evaluation (Signed)
Occupational Therapy Evaluation Patient Details Name: [name] MRN: [id number] DOB: 1950-09-27 Today's Date: 01/07/2013 Time: 7829-5621 OT Time Calculation (min): 40 min  OT Assessment / Plan / Recommendation History of present illness MICRO LUMBAR DECOMPRESSION L4-5 AND L5-S1 (Left)   Clinical Impression   Pt moving slowly due to pain but overall doing well. Husband present for session and obtained AE for pt that was recommended. He can provide assist at d/c PRN. Will see if here after today but appears pt will d/c later today. Ok to d/c from OT standpoint today.    OT Assessment  Patient needs continued OT Services    Follow Up Recommendations  No OT follow up;Supervision/Assistance - 24 hour    Barriers to Discharge      Equipment Recommendations  None recommended by OT    Recommendations for Other Services    Frequency  Min 2X/week    Precautions / Restrictions Precautions Precautions: Back Precaution Comments: Issued back care handout and reviewed all precautions with pt Restrictions Weight Bearing Restrictions: No   Pertinent Vitals/Pain 6/10 pain with activity; 5/10 with rest; informed nursing.    ADL  Eating/Feeding: Simulated;Independent Where Assessed - Eating/Feeding: Chair Grooming: Performed;Wash/dry hands;Min guard Where Assessed - Grooming: Unsupported standing Upper Body Bathing: Simulated;Chest;Right arm;Left arm;Abdomen;Set up;Supervision/safety Where Assessed - Upper Body Bathing: Unsupported sitting Lower Body Bathing: Simulated;Moderate assistance (without AE) Where Assessed - Lower Body Bathing: Supported sit to stand Upper Body Dressing: Simulated;Minimal assistance (with gown) Where Assessed - Upper Body Dressing: Unsupported sitting Lower Body Dressing: Simulated;Moderate assistance (without AE) Where Assessed - Lower Body Dressing: Supported sit to stand Toilet Transfer: Performed;Min guard;Minimal Web designer:  Raised toilet seat with arms (or 3-in-1 over toilet) Toileting - Clothing Manipulation and Hygiene: Simulated;Moderate assistance (unable to reach posterior periarea without twisting) Where Assessed - Toileting Clothing Manipulation and Hygiene: Sit to stand from 3-in-1 or toilet Equipment Used: Long-handled shoe horn;Long-handled sponge;Reacher;Rolling walker;Sock aid ADL Comments: Pt educated on all back precautions related to ADL and issued back care handout and reviewed. Discussed and demonstrated all AE including toilet aid. Husband purchased AE during session. Pt practiced with sock aid to don sock with min assist. Demonstrated all other AE pieces and pt verbalized understanding. She will need toilet aid as she cant reach posterior periarea without twisting and verbalized understanding of how to use. Pt states she doesnt feel ready to step into bathtub currently as she doesnt have anything to hold onto so she states she will likely sponge bathe initially. Discussed obtaining a tubseat when she feels ready to shower and pt states she will likely do so and verbalizes understanding of where to obtain a seat. Pt needs intermittant verbal cues for erect posture for back precautions. Pt with spasms during session so notified nursing.     OT Diagnosis: Generalized weakness;Acute pain  OT Problem List: Decreased strength;Decreased knowledge of use of DME or AE;Decreased knowledge of precautions OT Treatment Interventions: Self-care/ADL training;DME and/or AE instruction;Therapeutic activities;Patient/family education   OT Goals(Current goals can be found in the care plan section) Acute Rehab OT Goals Patient Stated Goal: to decrease pain OT Goal Formulation: With patient Time For Goal Achievement: 01/14/13 Potential to Achieve Goals: Good  Visit Information  Last OT Received On: 01/07/13 Assistance Needed: +1 History of Present Illness: MICRO LUMBAR DECOMPRESSION L4-5 AND L5-S1 (Left)        Prior Functioning     Home Living Family/patient expects to be discharged to:: Private residence Living  Arrangements: Spouse/significant other Available Help at Discharge: Available 24 hours/day Type of Home: House Home Access: Stairs to enter [employer] of Steps: 3 Entrance Stairs-Rails: Right;Left Home Layout: One level Home Equipment: Bedside commode;Cane - single point;Walker - 2 wheels Prior Function Level of Independence: Independent with assistive device(s) Comments: used cane for a few weeks PTA Communication Communication: No difficulties         Vision/Perception     Cognition  Cognition Arousal/Alertness: Awake/alert Behavior During Therapy: WFL for tasks assessed/performed Overall Cognitive Status: Within Functional Limits for tasks assessed    Extremity/Trunk Assessment Upper Extremity Assessment Upper Extremity Assessment: Overall WFL for tasks assessed     Mobility Bed Mobility Bed Mobility: Rolling Left;Left Sidelying to Sit Rolling Left: 4: Min guard Left Sidelying to Sit: 4: Min assist;HOB flat Details for Bed Mobility Assistance: Needed min assist to help bring trunk upright. increased time for all bed mobility due to pt reports feeling "stiff" Transfers Transfers: Sit to Stand;Stand to Sit Sit to Stand: 4: Min guard;4: Min assist;With upper extremity assist;From bed;From chair/3-in-1 Stand to Sit: 4: Min guard;With upper extremity assist;To chair/3-in-1 Details for Transfer Assistance: min assist initially to stand from bed but min guard from 3in1. verbal cues for erect posture and hand placement.     Exercise     Balance Balance Balance Assessed: Yes Static Standing Balance Static Standing - Level of Assistance: 5: Stand by assistance   End of Session OT - End of Session Equipment Utilized During Treatment: Rolling walker Activity Tolerance: Patient limited by pain Patient left: in chair;with call bell/phone within  reach;with family/visitor present  GO Functional Assessment Tool Used: clinical judgement Functional Limitation: Self care Self Care Current Status (Z6109): At least 20 percent but less than 40 percent impaired, limited or restricted Self Care Goal Status (U0454): At least 20 percent but less than 40 percent impaired, limited or restricted Self Care Discharge Status [phone]): At least 1 percent but less than 20 percent impaired, limited or restricted   Lennox Laity [phone] 01/07/2013, 10:44 AM

## 2013-01-07 NOTE — Progress Notes (Signed)
Subjective: 1 Day Post-Op Procedure(s) (LRB): MICRO LUMBAR DECOMPRESSION L4-5 AND L5-S1 (Left) Patient reports pain as mild.   Pt seen by myself and Dr. Shelle Iron in AM rounds. Reports some incisional back pain, controlled with pain meds. Leg pain is improved. Denies numbness or tingling. Nausea from yesterday resolved. Has been OOB to bathroom, voiding without difficulty. Would like to try to go home today.  Objective: Vital signs in last 24 hours: Temp:  [97.4 F (36.3 C)-99.4 F (37.4 C)] 98.7 F (37.1 C) (10/16 0617) Pulse Rate:  [52-71] 71 (10/16 0617) Resp:  [16] 16 (10/16 0617) BP: (101-123)/(43-76) 118/64 mmHg (10/16 0617) SpO2:  [95 %-100 %] 95 % (10/16 0617) Weight:  [100.245 kg (221 lb)] 100.245 kg (221 lb) (10/15 1245)  Intake/Output from previous day: 10/15 0701 - 10/16 0700 In: 1840 [P.O.:240; I.V.:1600] Out: 2575 [Urine:2500; Blood:75] Intake/Output this shift:     Recent Labs  01/07/13 0550  HGB 10.8*    Recent Labs  01/07/13 0550  WBC 8.2  RBC 4.19  HCT 33.8*  PLT 235    Recent Labs  01/07/13 0550  NA 138  K 3.6  CL 104  CO2 27  BUN 11  CREATININE 0.70  GLUCOSE 117*  CALCIUM 9.1   No results found for this basename: LABPT, INR,  in the last 72 hours  Neurologically intact ABD soft Neurovascular intact Sensation intact distally Intact pulses distally Dorsiflexion/Plantar flexion intact Incision: dressing C/D/I and no drainage No cellulitis present Compartment soft no calf pain or sign of DVT  Assessment/Plan: 1 Day Post-Op Procedure(s) (LRB): MICRO LUMBAR DECOMPRESSION L4-5 AND L5-S1 (Left) Advance diet Up with therapy D/C IV fluids Possible D/C later today if pain well controlled, tolerates PT well Discussed D/C instructions, Lspine precautions Follow up with Dr Shelle Iron in 10-14 days for staple removal  [name] M. 01/07/2013, 7:33 AM

## 2013-01-07 NOTE — Evaluation (Signed)
Physical Therapy Evaluation Patient Details Name: [name] MRN: [id number] DOB: January 18, 1951 Today's Date: 01/07/2013 Time: 4098-1191 PT Time Calculation (min): 10 min  PT Assessment / Plan / Recommendation History of Present Illness  MICRO LUMBAR DECOMPRESSION L4-5 AND L5-S1 (Left)  Clinical Impression  On eval, pt required Min guard assist for mobility-able to ambulate ~135 feet with RW. Reports increased spasms during session-medicated just prior to PT. All education completed. Verbally educated pt/husband on stair negotiation technique (did not practice due to spasms).     PT Assessment  Patent does not need any further PT services    Follow Up Recommendations  No PT follow up    Does the patient have the potential to tolerate intense rehabilitation      Barriers to Discharge        Equipment Recommendations  None recommended by PT    Recommendations for Other Services OT consult   Frequency      Precautions / Restrictions Precautions Precautions: Back Precaution Comments: Reviewed back precautions during functional activity. OT educated as well prior to PT session Restrictions Weight Bearing Restrictions: No   Pertinent Vitals/Pain 6/10 back L gluteal area.       Mobility  Transfers Transfers: Sit to Stand;Stand to Sit Sit to Stand: 4: Min guard;From chair/3-in-1 Stand to Sit: 4: Min guard;To chair/3-in-1 Details for Transfer Assistance: VCs safety, technique, hand placement.  Ambulation/Gait Ambulation/Gait Assistance: 4: Min guard Ambulation Distance (Feet): 135 Feet Assistive device: Rolling walker Ambulation/Gait Assistance Details: VCs safety. Slow gait speed. Pt reports increased spasms.  Gait Pattern: Step-through pattern;Decreased stride length Stairs: No (verbally educated pt/husband on technique)    Exercises     PT Diagnosis:    PT Problem List:   PT Treatment Interventions:       PT Goals(Current goals can be found in the care plan  section) Acute Rehab PT Goals Patient Stated Goal: to decrease pain PT Goal Formulation: No goals set, d/c therapy  Visit Information  Last PT Received On: 01/07/13 Assistance Needed: +1 History of Present Illness: MICRO LUMBAR DECOMPRESSION L4-5 AND L5-S1 (Left)       Prior Functioning  Home Living Family/patient expects to be discharged to:: Private residence Living Arrangements: Spouse/significant other Available Help at Discharge: Available 24 hours/day Type of Home: House Home Access: Stairs to enter [employer] of Steps: 3 Entrance Stairs-Rails: Right;Left Home Layout: One level Home Equipment: Bedside commode;Cane - single point;Walker - 2 wheels Prior Function Level of Independence: Independent with assistive device(s) Comments: used cane for a few weeks PTA Communication Communication: No difficulties    Cognition  Cognition Arousal/Alertness: Awake/alert Behavior During Therapy: WFL for tasks assessed/performed Overall Cognitive Status: Within Functional Limits for tasks assessed    Extremity/Trunk Assessment Upper Extremity Assessment Upper Extremity Assessment: Overall WFL for tasks assessed Lower Extremity Assessment Lower Extremity Assessment: Generalized weakness Cervical / Trunk Assessment Cervical / Trunk Assessment: Normal   Balance Balance Balance Assessed: Yes Static Standing Balance Static Standing - Level of Assistance: 5: Stand by assistance  End of Session PT - End of Session Activity Tolerance: Patient limited by pain Patient left: in chair;with call bell/phone within reach;with family/visitor present  GP Functional Assessment Tool Used: clinical judgement Functional Limitation: Mobility: Walking and moving around Mobility: Walking and Moving Around Current Status ([id number]): At least 1 percent but less than 20 percent impaired, limited or restricted Mobility: Walking and Moving Around Goal Status [phone]): At least 1 percent but less  than 20 percent impaired,  limited or restricted Mobility: Walking and Moving Around Discharge Status [phone]): At least 1 percent but less than 20 percent impaired, limited or restricted   Rebeca Alert, MPT Pager: [phone]

## 2013-01-07 NOTE — Plan of Care (Signed)
Problem: Consults Goal: Diagnosis - Spinal Surgery Outcome: Completed/Met Date Met:  01/07/13 Lumbar Laminectomy (Complex) and decompression of L4-L5

## 2013-01-08 NOTE — Discharge Summary (Signed)
Physician Discharge Summary   Patient ID: [name] MRN: [id number] DOB/AGE: 1951-02-26 62 y.o.  Admit date: 01/06/2013 Discharge date: 01/08/2013  Primary Diagnosis:   STENOSIS/HNP L4-5  Admission Diagnoses:  Past Medical History  Diagnosis Date  . Anemia   . Diabetes mellitus without complication   . Hypertension   . Hyperlipidemia LDL goal < 70   . CAD (coronary artery disease)     PCI to LAD in 2010, mild residual disease in LCX and RCA   Discharge Diagnoses:   Principal Problem:   Spinal stenosis, lumbar region, with neurogenic claudication  Procedure:  Procedure(s) (LRB): MICRO LUMBAR DECOMPRESSION L4-5 AND L5-S1 (Left)   Consults: None  HPI:  see H&P    Laboratory Data: Hospital Outpatient Visit on 12/29/2012  Component Date Value Range Status  . Sodium 12/29/2012 140  135 - 145 mEq/L Final  . Potassium 12/29/2012 3.7  3.5 - 5.1 mEq/L Final  . Chloride 12/29/2012 101  96 - 112 mEq/L Final  . CO2 12/29/2012 27  19 - 32 mEq/L Final  . Glucose, Bld 12/29/2012 86  70 - 99 mg/dL Final  . BUN 21/30/8657 14  6 - 23 mg/dL Final  . Creatinine, Ser 12/29/2012 0.65  0.50 - 1.10 mg/dL Final  . Calcium 84/69/6295 10.2  8.4 - 10.5 mg/dL Final  . GFR calc non Af Amer 12/29/2012 >90  >90 mL/min Final  . GFR calc Af Amer 12/29/2012 >90  >90 mL/min Final   Comment: (NOTE)                          The eGFR has been calculated using the CKD EPI equation.                          This calculation has not been validated in all clinical situations.                          eGFR's persistently <90 mL/min signify possible Chronic Kidney                          Disease.  . WBC 12/29/2012 7.7  4.0 - 10.5 K/uL Final  . RBC 12/29/2012 4.82  3.87 - 5.11 MIL/uL Final  . Hemoglobin 12/29/2012 12.9  12.0 - 15.0 g/dL Final  . HCT 28/41/3244 38.9  36.0 - 46.0 % Final  . MCV 12/29/2012 80.7  78.0 - 100.0 fL Final  . MCH 12/29/2012 26.8  26.0 - 34.0 pg Final  . MCHC 12/29/2012 33.2   30.0 - 36.0 g/dL Final  . RDW 03/27/7251 14.4  11.5 - 15.5 % Final  . Platelets 12/29/2012 307  150 - 400 K/uL Final  . MRSA, PCR 12/29/2012 NEGATIVE  NEGATIVE Final  . Staphylococcus aureus 12/29/2012 POSITIVE* NEGATIVE Final   Comment:                                 The Xpert SA Assay (FDA                          approved for NASAL specimens                          in patients  over 71 years of age),                          is one component of                          a comprehensive surveillance                          program.  Test performance has                          been validated by [employer] for patients greater                          than or equal to 76 year old.                          It is not intended                          to diagnose infection nor to                          guide or monitor treatment.    Recent Labs  01/07/13 0550  HGB 10.8*    Recent Labs  01/07/13 0550  WBC 8.2  RBC 4.19  HCT 33.8*  PLT 235    Recent Labs  01/07/13 0550  NA 138  K 3.6  CL 104  CO2 27  BUN 11  CREATININE 0.70  GLUCOSE 117*  CALCIUM 9.1   No results found for this basename: LABPT, INR,  in the last 72 hours  X-Rays:Dg Lumbar Spine 2-3 Views  12/29/2012   CLINICAL DATA:  Preop lumbar decompression. HNP/ stenosis L4-5.  EXAM: LUMBAR SPINE - 2-3 VIEW  COMPARISON:  MRI lumbar spine 08/26/2008  FINDINGS: Six non-rib-bearing lumbar vertebrae. Vertebral body alignment and heights are within normal. There is mild spondylosis throughout the lumbar spine. There is mild disc space narrowing at the L3-4 level through the L6 and S1 level. Facet arthropathy is present over the mid to lower lumbar spine. There is no compression fracture or subluxation. There is partial sacralization of the left transverse process of L6  IMPRESSION: Six non-rib-bearing lumbar vertebrae. Mild spondylosis throughout the lumbar spine with mild disc disease from  the L3-4 level through the L6-S1 level.   Electronically Signed   By: Elberta Fortis M.D.   On: 12/29/2012 15:05   Dg Spine Portable 1 View  01/06/2013   CLINICAL DATA:  Intraoperative localization.  EXAM: PORTABLE SPINE - 1 VIEW  COMPARISON:  Earlier the same date and before.  FINDINGS: Cross-table lateral view labeled 3 at 1019 hr. Transitional anatomy as before with transitional segment assigned L6.  The skin spreaders are unchanged. Blunt surgical instruments overlie the L5- L6 disc and the posterior inferior corner of the L6 segment.  IMPRESSION: Intraoperative localization as described.   Electronically Signed   By: Roxy Horseman M.D.   On: 01/06/2013 10:28   Dg Spine Portable 1 View  01/06/2013   CLINICAL DATA:  Intraoperative localization.  EXAM: PORTABLE SPINE - 1 VIEW  COMPARISON:  MRI 08/26/2008, radiographs 12/29/2012 and intraoperative view earlier the same date.  FINDINGS: Cross-table lateral view labeled #2 at 0918 hr. As discussed on the earlier examination, there is a discrepancy in the numbering between the radiographs of 12/29/2012 and MRI of 08/26/2008. Numbering on this study follows the radiographs of 12/29/2012 and earlier today.  There are skin spreaders posteriorly at the L5-L6 level. Surgical instruments overlie the L5 and L6 pedicles and posterior to the "L6-S1" disc space.  IMPRESSION: Intraoperative localization as described.   Electronically Signed   By: Roxy Horseman M.D.   On: 01/06/2013 09:36   Dg Spine Portable 1 View  01/06/2013   CLINICAL DATA:  Back surgery L4-5 and L5-S1  EXAM: PORTABLE SPINE - 1 VIEW  COMPARISON:  Lumbar radiographs 12/29/2012. MRI 08/26/2008  FINDINGS: There is a discrepancy in lumbar vertebral body numbering between the recent radiographic series and the prior MRI. The patient has transitional lumbar anatomy.  The lowest fully developed vertebral will be assigned L6, consistent with the recent radiographs.  Localizer needle in the posterior soft  tissues directed at the L4 spinous process.  Second localizer needle is located between the spinous processes of L5 and L6.  There is a well-developed disc space at L6 -S1.  IMPRESSION: The patient has transitional lumbosacral anatomy. There is a discrepancy in the vertebral body labeling compared the between the recent radiographic studies and prior MRI.  The lowest disc space is labeled L6-S1.  Lumbar surgical localization as above.   Electronically Signed   By: Marlan Palau M.D.   On: 01/06/2013 09:21    EKG: Orders placed during the hospital encounter of 12/07/12  . ED EKG  . ED EKG  . EKG 12-LEAD  . EKG 12-LEAD  . EKG     Hospital Course: Patient was admitted to [facility] and taken to the OR and underwent the above state procedure without complications.  Patient tolerated the procedure well and was later transferred to the recovery room and then to the orthopaedic floor for postoperative care.  They were given PO and IV analgesics for pain control following their surgery.  They were given 24 hours of postoperative antibiotics.   PT was consulted postop to assist with mobility and transfers.  The patient was allowed to be WBAT with therapy and was taught back precautions. Discharge planning was consulted to help with postop disposition and equipment needs.  Patient had a good night on the evening of surgery and started to get up OOB with therapy on day one. Patient was seen in rounds and was ready to go home on day one.  They were given discharge instructions and dressing directions.  They were instructed on when to follow up in the office with Dr. Shelle Iron.  Discharge Medications: Prior to Admission medications   Medication Sig Start Date End Date Taking? Authorizing Provider  cholecalciferol (VITAMIN D) 1000 UNITS tablet Take 1,000 Units by mouth daily.   Yes Historical Provider, MD  diazepam (VALIUM) 2 MG tablet Take 1 tablet (2 mg total) by mouth every 8 (eight) hours as needed.  For muscle spasm 11/26/12  Yes [name] Copland, MD  famotidine (PEPCID) 10 MG tablet Take 10 mg by mouth 2 (two) times daily.   Yes Historical Provider, MD  ferrous sulfate 325 (65 FE) MG tablet Take 325 mg by mouth daily with breakfast.   Yes Historical Provider, MD  hydrochlorothiazide (  HYDRODIURIL) 25 MG tablet Take 25 mg by mouth every morning.    Yes Historical Provider, MD  ibuprofen (ADVIL,MOTRIN) 200 MG tablet Take 400 mg by mouth every 6 (six) hours as needed for pain.   Yes Historical Provider, MD  pioglitazone-metformin (ACTOPLUS MET) 15-500 MG per tablet Take 1 tablet by mouth 2 (two) times daily with a meal.   Yes Historical Provider, MD  potassium chloride SA (K-DUR,KLOR-CON) 20 MEQ tablet Take 20 mEq by mouth daily.   Yes Historical Provider, MD  simvastatin (ZOCOR) 40 MG tablet Take 40 mg by mouth every evening.   Yes Historical Provider, MD  telmisartan (MICARDIS) 40 MG tablet Take 40 mg by mouth every morning.   Yes Historical Provider, MD  traMADol (ULTRAM) 50 MG tablet Take 1 tablet (50 mg total) by mouth every 6 (six) hours as needed for pain. 12/07/12  Yes Peyton Najjar, MD  zolpidem (AMBIEN) 10 MG tablet Take 0.5 tablets (5 mg total) by mouth at bedtime as needed for sleep. 12/08/12 01/07/13 Yes [name] Copland, MD  aspirin EC 81 MG tablet Take 1 tablet (81 mg total) by mouth daily. Resume 5 days post-op 01/07/13   Dayna Barker. Bissell, PA-C  clopidogrel (PLAVIX) 75 MG tablet Take 1 tablet (75 mg total) by mouth every morning. Resume 5 days post-op 01/07/13   Dayna Barker. Bissell, PA-C  cyclobenzaprine (FLEXERIL) 10 MG tablet Take 1 tablet (10 mg total) by mouth 3 (three) times daily as needed for muscle spasms. 01/06/13   Javier Docker, MD  ondansetron (ZOFRAN) 8 MG tablet Take 1 tablet (8 mg total) by mouth every 8 (eight) hours as needed for nausea. 12/08/12   Pearline Cables, MD  oxyCODONE-acetaminophen (PERCOCET) 7.5-325 MG per tablet Take 1-2 tablets by mouth every 4 (four)  hours as needed for pain. 01/06/13   Javier Docker, MD    Diet: Diabetic diet Activity:WBAT Follow-up:in 10-14 days Disposition - Home Discharged Condition: good   Discharge Orders   Future Orders Complete By Expires   Call MD / Call 911  As directed    Comments:     If you experience chest pain or shortness of breath, CALL 911 and be transported to the hospital emergency room.  If you develope a fever above 101 F, pus (white drainage) or increased drainage or redness at the wound, or calf pain, call your surgeon's office.   Constipation Prevention  As directed    Comments:     Drink plenty of fluids.  Prune juice may be helpful.  You may use a stool softener, such as Colace (over the counter) 100 mg twice a day.  Use MiraLax (over the counter) for constipation as needed.   Diet - low sodium heart healthy  As directed    Increase activity slowly as tolerated  As directed        Medication List    STOP taking these medications       chlorhexidine 4 % external liquid  Commonly known as:  HIBICLENS     mupirocin ointment 2 %  Commonly known as:  BACTROBAN     oxyCODONE-acetaminophen 5-325 MG per tablet  Commonly known as:  PERCOCET/ROXICET  Replaced by:  oxyCODONE-acetaminophen 7.5-325 MG per tablet      TAKE these medications       aspirin EC 81 MG tablet  Take 1 tablet (81 mg total) by mouth daily. Resume 5 days post-op     cholecalciferol 1000 UNITS  tablet  Commonly known as:  VITAMIN D  Take 1,000 Units by mouth daily.     clopidogrel 75 MG tablet  Commonly known as:  PLAVIX  Take 1 tablet (75 mg total) by mouth every morning. Resume 5 days post-op     cyclobenzaprine 10 MG tablet  Commonly known as:  FLEXERIL  Take 1 tablet (10 mg total) by mouth 3 (three) times daily as needed for muscle spasms.     diazepam 2 MG tablet  Commonly known as:  VALIUM  Take 1 tablet (2 mg total) by mouth every 8 (eight) hours as needed. For muscle spasm     famotidine 10 MG  tablet  Commonly known as:  PEPCID  Take 10 mg by mouth 2 (two) times daily.     ferrous sulfate 325 (65 FE) MG tablet  Take 325 mg by mouth daily with breakfast.     hydrochlorothiazide 25 MG tablet  Commonly known as:  HYDRODIURIL  Take 25 mg by mouth every morning.     ibuprofen 200 MG tablet  Commonly known as:  ADVIL,MOTRIN  Take 400 mg by mouth every 6 (six) hours as needed for pain.     ondansetron 8 MG tablet  Commonly known as:  ZOFRAN  Take 1 tablet (8 mg total) by mouth every 8 (eight) hours as needed for nausea.     oxyCODONE-acetaminophen 7.5-325 MG per tablet  Commonly known as:  PERCOCET  Take 1-2 tablets by mouth every 4 (four) hours as needed for pain.     pioglitazone-metformin 15-500 MG per tablet  Commonly known as:  ACTOPLUS MET  Take 1 tablet by mouth 2 (two) times daily with a meal.     potassium chloride SA 20 MEQ tablet  Commonly known as:  K-DUR,KLOR-CON  Take 20 mEq by mouth daily.     simvastatin 40 MG tablet  Commonly known as:  ZOCOR  Take 40 mg by mouth every evening.     telmisartan 40 MG tablet  Commonly known as:  MICARDIS  Take 40 mg by mouth every morning.     traMADol 50 MG tablet  Commonly known as:  ULTRAM  Take 1 tablet (50 mg total) by mouth every 6 (six) hours as needed for pain.     zolpidem 10 MG tablet  Commonly known as:  AMBIEN  Take 0.5 tablets (5 mg total) by mouth at bedtime as needed for sleep.           Follow-up Information   Follow up with BEANE,JEFFREY C, MD In 2 weeks.   Specialty:  Orthopedic Surgery   Contact information:   [address] [place] [address] [phone]       Signed: Dorothy Spark. 01/08/2013, 7:39 AM

## 2013-01-13 ENCOUNTER — Encounter: Payer: Self-pay | Admitting: Internal Medicine

## 2013-01-28 ENCOUNTER — Other Ambulatory Visit: Payer: Self-pay

## 2013-02-01 ENCOUNTER — Ambulatory Visit: Payer: 59

## 2013-02-01 ENCOUNTER — Ambulatory Visit: Payer: 59 | Admitting: Physical Therapy

## 2013-02-03 ENCOUNTER — Ambulatory Visit: Payer: 59 | Admitting: Internal Medicine

## 2013-02-17 ENCOUNTER — Ambulatory Visit (INDEPENDENT_AMBULATORY_CARE_PROVIDER_SITE_OTHER): Payer: 59 | Admitting: Internal Medicine

## 2013-02-17 ENCOUNTER — Encounter: Payer: Self-pay | Admitting: Internal Medicine

## 2013-02-17 VITALS — BP 142/80 | HR 87 | Ht 61.0 in | Wt 218.9 lb

## 2013-02-17 DIAGNOSIS — E785 Hyperlipidemia, unspecified: Secondary | ICD-10-CM

## 2013-02-17 DIAGNOSIS — I2510 Atherosclerotic heart disease of native coronary artery without angina pectoris: Secondary | ICD-10-CM

## 2013-02-17 DIAGNOSIS — E119 Type 2 diabetes mellitus without complications: Secondary | ICD-10-CM

## 2013-02-17 DIAGNOSIS — I10 Essential (primary) hypertension: Secondary | ICD-10-CM

## 2013-02-17 NOTE — Patient Instructions (Signed)
Your physician wants you to follow-up in:  6 months. You will receive a reminder letter in the mail two months in advance. If you don't receive a letter, please call our office to schedule the follow-up appointment.   

## 2013-02-17 NOTE — Progress Notes (Signed)
Date:  02/17/2013   ID:  [name], DOB December 28, 1950, MRN [id number]  PCP:  Abbe Amsterdam, MD  Primary Cardiologist:  Corrisa Gibby    History of Present Illness: [name] is a 62 y.o. female with a history of CAD with DES stent to prox. LAD in 2010 with residual disease in LCX 10-20% and RCA 20% lesion.  She also has HTN, DM, dyslipidemia and obesity.  She presented on 10/01/12 to the ED with c/o chest pressure. Discomfort began at work and associated with flushing symptoms secondary to niacin. No DOE, SOB, nausea or diaphoresis. She then developed chest pressure and nausea and went to Urgent care. She thought the flushing was secondary to stopping ASA. She was given ASA, cooled down and then had chest presssure. Sent to [facility] ER and NTG SL did resolve the pressure. She was very concerned about the pressure because it was the same that she had with her Lexiscan in 2010 that lead to her stent.  Coronary angiogram revealed widely patent proximal LAD stent with otherwise normal coronary arteries and normal LV function.  [name] recently underwent back surgery and is doing fairly well. She still is having some back pain but is adamant about not taking medication. She plans to go back to work on Monday next week. Unfortunately she is still grieving from her brother who passed away last week. He had stage IV pancreatic cancer and lived less than one year.  Wt Readings from Last 3 Encounters:  02/17/13 218 lb 14.4 oz (99.292 kg)  01/06/13 221 lb (100.245 kg)  01/06/13 221 lb (100.245 kg)     Past Medical History  Diagnosis Date  . Anemia   . Diabetes mellitus without complication   . Hypertension   . Hyperlipidemia LDL goal < 70   . CAD (coronary artery disease)     PCI to LAD in 2010, mild residual disease in LCX and RCA    Current Outpatient Prescriptions  Medication Sig Dispense Refill  . aspirin EC 81 MG tablet Take 1 tablet (81 mg total) by mouth daily. Resume 5 days post-op        . cholecalciferol (VITAMIN D) 1000 UNITS tablet Take 1,000 Units by mouth daily.      . clopidogrel (PLAVIX) 75 MG tablet Take 1 tablet (75 mg total) by mouth every morning. Resume 5 days post-op      . cyclobenzaprine (FLEXERIL) 10 MG tablet Take 1 tablet (10 mg total) by mouth 3 (three) times daily as needed for muscle spasms.  30 tablet  1  . diazepam (VALIUM) 2 MG tablet Take 1 tablet (2 mg total) by mouth every 8 (eight) hours as needed. For muscle spasm  30 tablet  0  . famotidine (PEPCID) 10 MG tablet Take 10 mg by mouth 2 (two) times daily.      . ferrous sulfate 325 (65 FE) MG tablet Take 325 mg by mouth daily with breakfast.      . hydrochlorothiazide (HYDRODIURIL) 25 MG tablet Take 25 mg by mouth every morning.       Marland Kitchen oxyCODONE-acetaminophen (PERCOCET) 7.5-325 MG per tablet Take 1-2 tablets by mouth every 4 (four) hours as needed for pain.  50 tablet  0  . pioglitazone-metformin (ACTOPLUS MET) 15-500 MG per tablet Take 1 tablet by mouth 2 (two) times daily with a meal.      . potassium chloride SA (K-DUR,KLOR-CON) 20 MEQ tablet Take 20 mEq by mouth daily.      Marland Kitchen  simvastatin (ZOCOR) 40 MG tablet Take 40 mg by mouth every evening.      Marland Kitchen telmisartan (MICARDIS) 40 MG tablet Take 40 mg by mouth every morning.      . traMADol (ULTRAM) 50 MG tablet Take 1 tablet (50 mg total) by mouth every 6 (six) hours as needed for pain.  30 tablet  0  . zolpidem (AMBIEN) 10 MG tablet Take 5 mg by mouth at bedtime as needed for sleep.       No current facility-administered medications for this visit.    Allergies:    Allergies  Allergen Reactions  . Niacin And Related Other (See Comments)    Whelps and skin flushed, mouth tingling    Social History:  The patient  reports that she has never smoked. She has never used smokeless tobacco. She reports that she drinks alcohol. She reports that she does not use illicit drugs.   Family history:   Family History  Problem Relation Age of Onset  .  Coronary artery disease Mother   . Rheum arthritis Mother   . Heart attack Father   . Hypertension Father   . Hypertension Brother   . Cancer Paternal Grandmother     stomach    ROS:  Please see the history of present illness.  All other systems reviewed and negative.   PHYSICAL EXAM: VS:  BP 142/80  Pulse 87  Ht 5\' 1"  (1.549 m)  Wt 218 lb 14.4 oz (99.292 kg)  BMI 41.38 kg/m2 Well nourished, well developed, in no acute distress HEENT: Pupils are equal round react to light accommodation extraocular movements are intact.  Cardiac: Regular rate and rhythm without murmurs rubs or gallops. Lungs:  clear to auscultation bilaterally, no wheezing, rhonchi or rales Ext: 1+ lower extremity edema.  2+ radial and dorsalis pedis pulses. Right wrist cath site healed nicely Skin: warm and dry Neuro:  Grossly normal Psych: Mood, affect normal  ASSESSMENT AND PLAN:  Problem List Items Addressed This Visit   Diabetes mellitus, type 2   HTN (hypertension)   CAD (coronary artery disease)PCI to LAD in 2010, mild residual disease in LCX and RCA - Primary   Relevant Orders      EKG 12-Lead   Hyperlipidemia LDL goal < 70   Obesity, morbid, BMI 40.0-49.9     PLAN:  1.  [name] is doing well without any chest pain. She continues on aspirin and Plavix and has had no difficulty with her recent back surgery. She is taking tramadol as needed for pain. Just takes Zocor and recently had lab work through Dr. Durel Salts office which showed good cholesterol control. Plan to see her back in 6 months or sooner as necessary.  Chrystie Nose, MD, [facility] Attending Cardiologist [facility]

## 2013-02-22 ENCOUNTER — Ambulatory Visit: Payer: 59 | Attending: Specialist | Admitting: Physical Therapy

## 2013-02-22 DIAGNOSIS — M25559 Pain in unspecified hip: Secondary | ICD-10-CM | POA: Insufficient documentation

## 2013-02-22 DIAGNOSIS — IMO0001 Reserved for inherently not codable concepts without codable children: Secondary | ICD-10-CM | POA: Insufficient documentation

## 2013-02-22 DIAGNOSIS — M545 Low back pain, unspecified: Secondary | ICD-10-CM | POA: Insufficient documentation

## 2013-03-02 ENCOUNTER — Ambulatory Visit: Payer: 59 | Admitting: Rehabilitation

## 2013-03-03 ENCOUNTER — Other Ambulatory Visit (HOSPITAL_COMMUNITY): Payer: Self-pay | Admitting: Specialist

## 2013-03-03 DIAGNOSIS — M79672 Pain in left foot: Secondary | ICD-10-CM

## 2013-03-04 ENCOUNTER — Other Ambulatory Visit: Payer: Self-pay | Admitting: Family Medicine

## 2013-03-04 ENCOUNTER — Ambulatory Visit (HOSPITAL_COMMUNITY)
Admission: RE | Admit: 2013-03-04 | Discharge: 2013-03-04 | Disposition: A | Discharge status: Home / Self Care | Payer: 59 | Source: Ambulatory Visit | Attending: Specialist | Admitting: Specialist

## 2013-03-04 ENCOUNTER — Other Ambulatory Visit (HOSPITAL_COMMUNITY): Payer: Self-pay | Admitting: Specialist

## 2013-03-04 DIAGNOSIS — R609 Edema, unspecified: Secondary | ICD-10-CM | POA: Insufficient documentation

## 2013-03-04 DIAGNOSIS — M65979 Unspecified synovitis and tenosynovitis, unspecified ankle and foot: Secondary | ICD-10-CM | POA: Insufficient documentation

## 2013-03-04 DIAGNOSIS — M79672 Pain in left foot: Secondary | ICD-10-CM

## 2013-03-04 DIAGNOSIS — Z1231 Encounter for screening mammogram for malignant neoplasm of breast: Secondary | ICD-10-CM

## 2013-03-04 DIAGNOSIS — M722 Plantar fascial fibromatosis: Secondary | ICD-10-CM | POA: Insufficient documentation

## 2013-03-04 DIAGNOSIS — M659 Synovitis and tenosynovitis, unspecified: Secondary | ICD-10-CM | POA: Insufficient documentation

## 2013-03-04 DIAGNOSIS — M25473 Effusion, unspecified ankle: Secondary | ICD-10-CM | POA: Insufficient documentation

## 2013-03-04 DIAGNOSIS — M25476 Effusion, unspecified foot: Secondary | ICD-10-CM | POA: Insufficient documentation

## 2013-03-09 ENCOUNTER — Encounter: Payer: 59 | Admitting: Rehabilitation

## 2013-03-22 ENCOUNTER — Other Ambulatory Visit: Payer: Self-pay | Admitting: *Deleted

## 2013-03-22 MED ORDER — HYDROCHLOROTHIAZIDE 25 MG PO TABS: 25.0000 mg | ORAL_TABLET | Freq: Every morning | ORAL | Status: DC

## 2013-03-22 NOTE — Telephone Encounter (Signed)
Rx was sent to pharmacy electronically. 

## 2013-04-12 ENCOUNTER — Other Ambulatory Visit: Payer: Self-pay | Admitting: Internal Medicine

## 2013-04-12 NOTE — Telephone Encounter (Signed)
Rx was sent to pharmacy electronically. 

## 2013-04-26 ENCOUNTER — Ambulatory Visit (HOSPITAL_COMMUNITY): Payer: 59

## 2013-04-27 ENCOUNTER — Ambulatory Visit (HOSPITAL_COMMUNITY)
Admission: RE | Admit: 2013-04-27 | Discharge: 2013-04-27 | Disposition: A | Discharge status: Home / Self Care | Payer: 59 | Source: Ambulatory Visit | Attending: Family Medicine | Admitting: Family Medicine

## 2013-04-27 DIAGNOSIS — Z1231 Encounter for screening mammogram for malignant neoplasm of breast: Secondary | ICD-10-CM | POA: Insufficient documentation

## 2013-05-06 ENCOUNTER — Other Ambulatory Visit: Payer: Self-pay | Admitting: Internal Medicine

## 2013-05-07 ENCOUNTER — Other Ambulatory Visit: Payer: Self-pay | Admitting: Physician Assistant

## 2013-05-07 DIAGNOSIS — E119 Type 2 diabetes mellitus without complications: Secondary | ICD-10-CM

## 2013-05-09 ENCOUNTER — Encounter: Payer: Self-pay | Admitting: Family Medicine

## 2013-05-09 NOTE — Telephone Encounter (Signed)
Dr Patsy Lager[place]land, pt was last seen for DM in Sept. I have pended 1 mos w/note OV needed, but wanted to make sure you want pt taking this. It is not on her med list at either Sept OV?

## 2013-05-10 ENCOUNTER — Other Ambulatory Visit: Payer: Self-pay | Admitting: *Deleted

## 2013-07-01 ENCOUNTER — Other Ambulatory Visit: Payer: Self-pay

## 2013-07-07 ENCOUNTER — Other Ambulatory Visit: Payer: Self-pay | Admitting: *Deleted

## 2013-07-07 MED ORDER — CLOPIDOGREL BISULFATE 75 MG PO TABS: 75.0000 mg | ORAL_TABLET | Freq: Every morning | ORAL | Status: DC

## 2013-07-07 NOTE — Telephone Encounter (Signed)
Rx refill sent to patient pharmacy   

## 2013-07-08 ENCOUNTER — Ambulatory Visit (INDEPENDENT_AMBULATORY_CARE_PROVIDER_SITE_OTHER): Payer: 59 | Admitting: Physician Assistant

## 2013-07-08 VITALS — BP 140/60 | HR 80 | Temp 97.8°F | Resp 16 | Ht 61.0 in | Wt 228.0 lb

## 2013-07-08 DIAGNOSIS — R05 Cough: Secondary | ICD-10-CM

## 2013-07-08 DIAGNOSIS — R059 Cough, unspecified: Secondary | ICD-10-CM

## 2013-07-08 DIAGNOSIS — J329 Chronic sinusitis, unspecified: Secondary | ICD-10-CM

## 2013-07-08 DIAGNOSIS — E119 Type 2 diabetes mellitus without complications: Secondary | ICD-10-CM

## 2013-07-08 MED ORDER — ALBUTEROL SULFATE HFA 108 (90 BASE) MCG/ACT IN AERS: 2.0000 | INHALATION_SPRAY | Freq: Four times a day (QID) | RESPIRATORY_TRACT | Status: DC | PRN

## 2013-07-08 MED ORDER — BENZONATATE 100 MG PO CAPS: ORAL_CAPSULE | ORAL | Status: AC

## 2013-07-08 MED ORDER — MUCINEX DM MAXIMUM STRENGTH 60-1200 MG PO TB12: 1.0000 | ORAL_TABLET | Freq: Two times a day (BID) | ORAL | Status: DC

## 2013-07-08 MED ORDER — METFORMIN HCL ER 500 MG PO TB24: 500.0000 mg | ORAL_TABLET | Freq: Every day | ORAL | Status: DC

## 2013-07-08 MED ORDER — AMOXICILLIN 875 MG PO TABS: 875.0000 mg | ORAL_TABLET | Freq: Two times a day (BID) | ORAL | Status: DC

## 2013-07-08 NOTE — Patient Instructions (Addendum)
Push fluids. Tylenol for the neck pain and use your muscle spasms for the neck pain. Diabetes - we will stop the Actos and just use the Metformin and see if that will keep your diabetes under control.  If your control decreases the metformin can be increased

## 2013-07-08 NOTE — Progress Notes (Signed)
Subjective:    Patient ID: [name], female    DOB: 1950/12/22, 63 y.o.   MRN: [id number]  HPI Pt presents to clinic with sinus congestion and intermittent am yellow and green rhinorrhea for the last 3 months.  She has gotten better every few weeks but never well and now she thinks that she needs to be evaluated.  Yesterday she developed a cough with intermittent green sputum and some wheezing last night and trouble catching a full breath when she lays down.  About 2 weeks ago she thought she might be having some allergy stuff but she took Claritin and did not help.  She has been using some OTC cold preps but not getting much relief over the last 2 weeks.  She has a PND with a cough that is coming from her upper chest.  She is not sleeping well due to the cough.  OTC Meds - Rob DM  She is also having neck tightness from muscle spasms.  She took some of her valium and that helped with the muscle spasm.    She is also concerned about the commercials she is seeing on TV about Actos and bladder cancer and wonders if she should still be on that medication for her diabetes which is well controlled.  She does not check her glucose at home.  Review of Systems  Constitutional: Negative for fever and chills.  HENT: Positive for congestion, postnasal drip and rhinorrhea (slightly yellow/green in the am).   Respiratory: Positive for cough (internittent prdocutive cough - green).        No h/o asthma, nonsmoker  Musculoskeletal: Positive for neck pain (lower neck - tight - muscle spasm).  Allergic/Immunologic: Positive for environmental allergies.       Objective:   Physical Exam  Vitals reviewed. Constitutional: She is oriented to person, place, and time. She appears well-developed and well-nourished.  HENT:  Head: Normocephalic and atraumatic.  Right Ear: Hearing, tympanic membrane, external ear and ear canal normal.  Left Ear: Hearing, tympanic membrane, external ear and ear canal normal.    Nose: Mucosal edema (red) present.  Mouth/Throat: Uvula is midline, oropharynx is clear and moist and mucous membranes are normal.  Eyes: Conjunctivae are normal.  Neck: Normal range of motion.  Cardiovascular: Normal rate, regular rhythm and normal heart sounds.   No murmur heard. Pulmonary/Chest: Effort normal and breath sounds normal. She has no wheezes.  Lymphadenopathy:    She has no cervical adenopathy.  Neurological: She is alert and oriented to person, place, and time.  Skin: Skin is warm and dry.  Psychiatric: She has a normal mood and affect. Her behavior is normal. Judgment and thought content normal.       Assessment & Plan:  Sinusitis - Due to the length on time of her illness we will cover for sinus infection and treat the congestion and cough with symptomatic care in addition.  She has needed an inhaler before for RAD and we will try that for her cough though she has no wheezing tonight during her exam.  Plan: amoxicillin (AMOXIL) 875 MG tablet  Cough - Plan: Dextromethorphan-Guaifenesin (MUCINEX DM MAXIMUM STRENGTH) 60-1200 MG TB12, benzonatate (TESSALON) 100 MG capsule, albuterol (PROAIR HFA) 108 (90 BASE) MCG/ACT inhaler  Diabetes mellitus, type 2 - with her well controlled diabetes and concerned about media news and Actos and cardiac history we will stop the actos and have the patient just use metformin.  We will f/u for CPE in July  and at that time we will determine her diabetes control with the medication change.  Plan: metFORMIN (GLUCOPHAGE XR) 500 MG 24 hr tablet  Benny LennertSarah Jona Erkkila PA-C  [facility] 07/08/2013 7:41 PM

## 2013-09-27 ENCOUNTER — Other Ambulatory Visit: Payer: Self-pay | Admitting: Family Medicine

## 2013-09-27 ENCOUNTER — Encounter: Payer: Self-pay | Admitting: Family Medicine

## 2013-09-27 ENCOUNTER — Ambulatory Visit (INDEPENDENT_AMBULATORY_CARE_PROVIDER_SITE_OTHER): Payer: 59 | Admitting: Family Medicine

## 2013-09-27 VITALS — BP 130/66 | HR 69 | Temp 98.0°F | Resp 16 | Ht 62.0 in | Wt 220.6 lb

## 2013-09-27 DIAGNOSIS — E119 Type 2 diabetes mellitus without complications: Secondary | ICD-10-CM

## 2013-09-27 DIAGNOSIS — Z1329 Encounter for screening for other suspected endocrine disorder: Secondary | ICD-10-CM

## 2013-09-27 DIAGNOSIS — M545 Low back pain, unspecified: Secondary | ICD-10-CM

## 2013-09-27 DIAGNOSIS — G4700 Insomnia, unspecified: Secondary | ICD-10-CM

## 2013-09-27 DIAGNOSIS — E7800 Pure hypercholesterolemia, unspecified: Secondary | ICD-10-CM

## 2013-09-27 DIAGNOSIS — Z0000 Encounter for general adult medical examination without abnormal findings: Secondary | ICD-10-CM

## 2013-09-27 DIAGNOSIS — M25579 Pain in unspecified ankle and joints of unspecified foot: Secondary | ICD-10-CM

## 2013-09-27 DIAGNOSIS — I10 Essential (primary) hypertension: Secondary | ICD-10-CM

## 2013-09-27 LAB — COMPREHENSIVE METABOLIC PANEL
ALBUMIN: 4.3 g/dL (ref 3.5–5.2)
ALK PHOS: 138 U/L — AB (ref 39–117)
ALT: 15 U/L (ref 0–35)
AST: 13 U/L (ref 0–37)
BUN: 17 mg/dL (ref 6–23)
CO2: 23 mEq/L (ref 19–32)
Calcium: 9.9 mg/dL (ref 8.4–10.5)
Chloride: 103 mEq/L (ref 96–112)
Creat: 0.73 mg/dL (ref 0.50–1.10)
GLUCOSE: 119 mg/dL — AB (ref 70–99)
Potassium: 4.3 mEq/L (ref 3.5–5.3)
Sodium: 141 mEq/L (ref 135–145)
Total Bilirubin: 0.5 mg/dL (ref 0.2–1.2)
Total Protein: 6.8 g/dL (ref 6.0–8.3)

## 2013-09-27 LAB — LIPID PANEL
CHOL/HDL RATIO: 5.3 ratio
CHOLESTEROL: 164 mg/dL (ref 0–200)
HDL: 31 mg/dL — ABNORMAL LOW (ref >39)
LDL Cholesterol: 66 mg/dL (ref 0–99)
Triglycerides: 333 mg/dL — ABNORMAL HIGH (ref <150)
VLDL: 67 mg/dL — ABNORMAL HIGH (ref 0–40)

## 2013-09-27 LAB — CBC WITH DIFFERENTIAL/PLATELET
BASOS PCT: 0 % (ref 0–1)
Basophils Absolute: 0 10*3/uL (ref 0.0–0.1)
Eosinophils Absolute: 0.4 10*3/uL (ref 0.0–0.7)
Eosinophils Relative: 4 % (ref 0–5)
HCT: 37.3 % (ref 36.0–46.0)
HEMOGLOBIN: 12.5 g/dL (ref 12.0–15.0)
LYMPHS ABS: 3.1 10*3/uL (ref 0.7–4.0)
Lymphocytes Relative: 32 % (ref 12–46)
MCH: 25.5 pg — ABNORMAL LOW (ref 26.0–34.0)
MCHC: 33.5 g/dL (ref 30.0–36.0)
MCV: 76 fL — ABNORMAL LOW (ref 78.0–100.0)
Monocytes Absolute: 0.9 10*3/uL (ref 0.1–1.0)
Monocytes Relative: 9 % (ref 3–12)
NEUTROS ABS: 5.4 10*3/uL (ref 1.7–7.7)
NEUTROS PCT: 55 % (ref 43–77)
Platelets: 336 10*3/uL (ref 150–400)
RBC: 4.91 MIL/uL (ref 3.87–5.11)
RDW: 14.3 % (ref 11.5–15.5)
WBC: 9.8 10*3/uL (ref 4.0–10.5)

## 2013-09-27 LAB — TSH: TSH: 1.482 u[IU]/mL (ref 0.350–4.500)

## 2013-09-27 LAB — HEMOGLOBIN A1C
Hgb A1c MFr Bld: 6.5 % — ABNORMAL HIGH (ref <5.7)
MEAN PLASMA GLUCOSE: 140 mg/dL — AB (ref <117)

## 2013-09-27 LAB — MICROALBUMIN, URINE: MICROALB UR: 0.5 mg/dL (ref 0.00–1.89)

## 2013-09-27 MED ORDER — ZOLPIDEM TARTRATE 10 MG PO TABS: 5.0000 mg | ORAL_TABLET | Freq: Every evening | ORAL | Status: DC | PRN

## 2013-09-27 MED ORDER — POTASSIUM CHLORIDE CRYS ER 20 MEQ PO TBCR: 20.0000 meq | EXTENDED_RELEASE_TABLET | Freq: Every day | ORAL | Status: DC

## 2013-09-27 MED ORDER — CYCLOBENZAPRINE HCL 10 MG PO TABS: 10.0000 mg | ORAL_TABLET | Freq: Three times a day (TID) | ORAL | Status: DC | PRN

## 2013-09-27 MED ORDER — SIMVASTATIN 40 MG PO TABS: 40.0000 mg | ORAL_TABLET | Freq: Every evening | ORAL | Status: DC

## 2013-09-27 MED ORDER — TELMISARTAN 40 MG PO TABS: 40.0000 mg | ORAL_TABLET | Freq: Every morning | ORAL | Status: DC

## 2013-09-27 NOTE — Progress Notes (Signed)
[facility] [address] [place] [address] [phone]  Date:  09/27/2013   Name:  [name]   DOB:  05-05-1950   MRN:  [id number]  PCP:  Lamar Blinks, MD    Chief Complaint: Annual Exam   History of Present Illness:  [name] is a 63 y.o. very pleasant female patient who presents with the following:  Here today for a CPE and labs.  Last seen by myself about one year ago Cardiologist is Dr. Debara Pickett.  Last 1c last September was 5.7%.   She is a retired Biomedical engineer, non- smoker She does have a foot injury that occurred in November- she is seeing Dr. Doran Durand and underoing PT.  In fact it was this injury that forced her retirement.  She is ok being retired, but hopes that her foot function will continue to improve so she can trying going back to at least part- time work.   She is fasting today for labs  She occasionally will use her flexeril for back pain, and does use tramadol for her foot pain. Her foot is bothersome if she stands for a long period of time.  She will rarely use an [place].    S/p hysterectomy for benign disease (fibroids)   Wt Readings from Last 3 Encounters:  09/27/13 220 lb 9.6 oz (100.064 kg)  07/08/13 228 lb (103.42 kg)  02/17/13 218 lb 14.4 oz (99.292 kg)    Patient Active Problem List   Diagnosis Date Noted  . Spinal stenosis, lumbar region, with neurogenic claudication 01/06/2013  . Obesity, morbid, BMI 40.0-49.9 10/20/2012  . Chest pain 10/02/2012  . CAD (coronary artery disease)PCI to LAD in 2010, mild residual disease in LCX and RCA   . Hyperlipidemia LDL goal < 70   . Diabetes mellitus, type 2 04/16/2012  . HTN (hypertension) 04/16/2012    Past Medical History  Diagnosis Date  . Anemia   . Diabetes mellitus without complication   . Hypertension   . Hyperlipidemia LDL goal < 70   . CAD (coronary artery disease)     PCI to LAD in 2010, mild residual disease in LCX and RCA    Past Surgical History   Procedure Laterality Date  . Tympanoplasty Bilateral   . Carotid stent  2009  . Doppler echocardiography  08/01/2009    EF=>55%,LV normal  . Nm myocar perf wall motion  09/22/2008    lexiscan-EF 83%; glogal LV systolic fx is norm. ,evidence of mild ischemia basal anterior,midanterior and apical lateral region(s).   . Lower arterial duplex  06/20/10    abi's normal,rgt 0.98,lft 1.06;bilateral PVRs normal  . Abdominal hysterectomy    . Coronary angioplasty with stent placement  2010and 10-02-2012    Stent DES, Xience to prox. LAD  . Lumbar laminectomy/decompression microdiscectomy Left 01/06/2013    Procedure: MICRO LUMBAR DECOMPRESSION L4-5 AND L5-S1;  Surgeon: Johnn Hai, MD;  Location: [facility];  Service: Orthopedics;  Laterality: Left;    History  Substance Use Topics  . Smoking status: Never Smoker   . Smokeless tobacco: Never Used  . Alcohol Use: Yes     Comment: occasional    Family History  Problem Relation Age of Onset  . Coronary artery disease Mother   . Rheum arthritis Mother   . Heart attack Father   . Hypertension Father   . Hypertension Brother   . Cancer Paternal Grandmother     stomach    Allergies  Allergen Reactions  . Niacin And Related Other (See Comments)    Whelps and skin flushed, mouth tingling    Medication list has been reviewed and updated.  Current Outpatient Prescriptions on File Prior to Visit  Medication Sig Dispense Refill  . albuterol (PROAIR HFA) 108 (90 BASE) MCG/ACT inhaler Inhale 2 puffs into the lungs every 6 (six) hours as needed for wheezing or shortness of breath.  1 Inhaler  0  . amoxicillin (AMOXIL) 875 MG tablet Take 1 tablet (875 mg total) by mouth 2 (two) times daily.  20 tablet  0  . aspirin EC 81 MG tablet Take 1 tablet (81 mg total) by mouth daily. Resume 5 days post-op      . cholecalciferol (VITAMIN D) 1000 UNITS tablet Take 1,000 Units by mouth daily.      . clopidogrel (PLAVIX) 75 MG tablet Take 1 tablet (75 mg  total) by mouth every morning.  90 tablet  3  . cyclobenzaprine (FLEXERIL) 10 MG tablet Take 1 tablet (10 mg total) by mouth 3 (three) times daily as needed for muscle spasms.  30 tablet  1  . Dextromethorphan-Guaifenesin (MUCINEX DM MAXIMUM STRENGTH) 60-1200 MG TB12 Take 1 tablet by mouth 2 (two) times daily.  14 each  0  . diazepam (VALIUM) 2 MG tablet Take 1 tablet (2 mg total) by mouth every 8 (eight) hours as needed. For muscle spasm  30 tablet  0  . famotidine (PEPCID) 20 MG tablet TAKE 1 TABLET BY MOUTH TWICE DAILY  180 tablet  3  . ferrous sulfate 325 (65 FE) MG tablet Take 325 mg by mouth daily with breakfast.      . hydrochlorothiazide (HYDRODIURIL) 25 MG tablet Take 1 tablet (25 mg total) by mouth every morning.  90 tablet  3  . loratadine (CLARITIN) 10 MG tablet Take 10 mg by mouth daily.      . metFORMIN (GLUCOPHAGE XR) 500 MG 24 hr tablet Take 1 tablet (500 mg total) by mouth daily with breakfast.  60 tablet  3  . oxyCODONE-acetaminophen (PERCOCET) 7.5-325 MG per tablet Take 1-2 tablets by mouth every 4 (four) hours as needed for pain.  50 tablet  0  . pioglitazone-metformin (ACTOPLUS MET) 15-500 MG per tablet TAKE 1 TABLET BY MOUTH TWICE DAILY  180 tablet  PRN  . potassium chloride SA (K-DUR,KLOR-CON) 20 MEQ tablet Take 20 mEq by mouth daily.      . simvastatin (ZOCOR) 40 MG tablet Take 40 mg by mouth every evening.      Marland Kitchen telmisartan (MICARDIS) 40 MG tablet Take 40 mg by mouth every morning.      . traMADol (ULTRAM) 50 MG tablet Take 1 tablet (50 mg total) by mouth every 6 (six) hours as needed for pain.  30 tablet  0  . zolpidem (AMBIEN) 10 MG tablet Take 5 mg by mouth at bedtime as needed for sleep.       No current facility-administered medications on file prior to visit.    Review of Systems:  As per HPI- otherwise negative.   Physical Examination: Filed Vitals:   09/27/13 0809  BP: 130/66  Pulse: 69  Temp: 98 F (36.7 C)  Resp: 16   Filed Vitals:   09/27/13  0809  Height: _0  (1.575 m)  Weight: 220 lb 9.6 oz (100.064 kg)   Body mass index is 40.34 kg/(m^2). Ideal Body Weight: Weight in (lb) to have BMI = 25: 136.4  GEN: WDWN, NAD, Non-toxic,  A & O x 3, obese, looks well HEENT: Atraumatic, Normocephalic. Neck supple. No masses, No LAD. Ears and Nose: No external deformity. CV: RRR, No M/G/R. No JVD. No thrill. No extra heart sounds. PULM: CTA B, no wheezes, crackles, rhonchi. No retractions. No resp. distress. No accessory muscle use. ABD: S, NT, ND, +BS. No rebound. No HSM. EXTR: No c/c/e NEURO Normal gait.  PSYCH: Normally interactive. Conversant. Not depressed or anxious appearing.  Calm demeanor.    Assessment and Plan: Physical exam - Plan: CBC with Differential, Comprehensive metabolic panel  Type II or unspecified type diabetes mellitus without mention of complication, not stated as uncontrolled - Plan: Microalbumin, urine, Hemoglobin A1c  Screening for hypothyroidism - Plan: TSH  Essential hypertension - Plan: potassium chloride SA (K-DUR,KLOR-CON) 20 MEQ tablet, telmisartan (MICARDIS) 40 MG tablet  High cholesterol - Plan: simvastatin (ZOCOR) 40 MG tablet, Lipid panel  Midline low back pain, with sciatica presence unspecified - Plan: cyclobenzaprine (FLEXERIL) 10 MG tablet  Insomnia - Plan: zolpidem (AMBIEN) 10 MG tablet  Await labs, and did refills as above.  She plans to see her GI doctor and update her colonoscopy before her hospital insurance runs out.    Signed Lamar Blinks, MD

## 2013-09-27 NOTE — Patient Instructions (Signed)
Great to see you today as always. I will be in touch with your labs.  Your blood sugar looks fine.  Good luck with your foot- I hope that it continues to improve.   Get the zostavax vaccine at your convenience- ?can get at outpt pharm

## 2013-09-28 NOTE — Telephone Encounter (Signed)
Dr Patsy Lageropland, you just saw pt and discussed back pain which Dr Alwyn RenHopper Rxd for her last fall. Do you want to Rx for pt? It looks like your plan was flexeril.

## 2013-12-01 ENCOUNTER — Ambulatory Visit (INDEPENDENT_AMBULATORY_CARE_PROVIDER_SITE_OTHER): Payer: 59 | Admitting: Family Medicine

## 2013-12-01 VITALS — BP 118/74 | HR 68 | Temp 97.4°F | Resp 16 | Ht 62.0 in | Wt 210.8 lb

## 2013-12-01 DIAGNOSIS — E118 Type 2 diabetes mellitus with unspecified complications: Principal | ICD-10-CM

## 2013-12-01 DIAGNOSIS — E1165 Type 2 diabetes mellitus with hyperglycemia: Secondary | ICD-10-CM

## 2013-12-01 DIAGNOSIS — IMO0002 Reserved for concepts with insufficient information to code with codable children: Secondary | ICD-10-CM

## 2013-12-01 DIAGNOSIS — Z23 Encounter for immunization: Secondary | ICD-10-CM

## 2013-12-01 DIAGNOSIS — R413 Other amnesia: Secondary | ICD-10-CM

## 2013-12-01 LAB — POCT GLYCOSYLATED HEMOGLOBIN (HGB A1C): Hemoglobin A1C: 6.3

## 2013-12-01 NOTE — Patient Instructions (Signed)
We will refer you to neurology for further evaluation of your memory. You did well on your memory test today.  If you do not hear about your neurology appointment soon please let me know.    Try adding an OTC anti- fungal cream such as lotromin once or twice a day as well on your rash.    Results for orders placed in visit on 12/01/13  POCT GLYCOSYLATED HEMOGLOBIN (HGB A1C)      Result Value Ref Range   Hemoglobin A1C 6.3     Your A1c looks good today. Continue your current dose of diabetes medications

## 2013-12-01 NOTE — Progress Notes (Signed)
[facility] [address], [place] [address] [phone]  Date:  12/01/2013   Name:  [name]   DOB:  [birth year]   MRN:  [id number]  PCP:  Abbe Amsterdam, MD    Chief Complaint: Sore and Memory   History of Present Illness:  [name] is a 63 y.o. very pleasant female patient who presents with the following:  She would like to check an A1c today.  She has noted some sore, rashy areas under her bilateral breasts for a couple of months, she is having a hard time getting these to heal up.  She has used some neosporin and desitin which is helping She also needs more strips for her meter.  She is concerned that this rash could indicate that her DM is out of control  Lab Results  Component Value Date   HGBA1C 6.5* 09/27/2013   She would like a flu shot today as well She is concerned about her memory.  Her husband has noted it for about a year.  She has fully retired from her job, but had been working very long hours and caring for her ill brother.  They had blamed stress for this, but right now her stress is decreased.  She has noted difficulty with paying bills, remembering plans.  There is a history of alzhemier's in her family.    She just rarely takes [place]. She does have restless legs and will take valium or trmaadol at night.   She may use flexeril just occasionally.   Patient Active Problem List   Diagnosis Date Noted  . Spinal stenosis, lumbar region, with neurogenic claudication 01/06/2013  . Obesity, morbid, BMI 40.0-49.9 10/20/2012  . Chest pain 10/02/2012  . CAD (coronary artery disease)PCI to LAD in 2010, mild residual disease in LCX and RCA   . Hyperlipidemia LDL goal < 70   . Diabetes mellitus, type 2 04/16/2012  . HTN (hypertension) 04/16/2012    Past Medical History  Diagnosis Date  . Anemia   . Diabetes mellitus without complication   . Hypertension   . Hyperlipidemia LDL goal < 70   . CAD (coronary artery disease)     PCI to  LAD in 2010, mild residual disease in LCX and RCA  . GERD (gastroesophageal reflux disease)   . Plantar fascia rupture     Left Foot    Past Surgical History  Procedure Laterality Date  . Tympanoplasty Bilateral   . Carotid stent  2009  . Doppler echocardiography  08/01/2009    EF=>55%,LV normal  . Nm myocar perf wall motion  09/22/2008    lexiscan-EF 83%; glogal LV systolic fx is norm. ,evidence of mild ischemia basal anterior,midanterior and apical lateral region(s).   . Lower arterial duplex  06/20/10    abi's normal,rgt 0.98,lft 1.06;bilateral PVRs normal  . Abdominal hysterectomy    . Coronary angioplasty with stent placement  2010and 10-02-2012    Stent DES, Xience to prox. LAD  . Lumbar laminectomy/decompression microdiscectomy Left 01/06/2013    Procedure: MICRO LUMBAR DECOMPRESSION L4-5 AND L5-S1;  Surgeon: Javier Docker, MD;  Location: [facility];  Service: Orthopedics;  Laterality: Left;    History  Substance Use Topics  . Smoking status: Never Smoker   . Smokeless tobacco: Never Used  . Alcohol Use: Yes     Comment: occasional, 1 a month wine    Family History  Problem Relation Age of Onset  . Coronary artery disease Mother   .  Rheum arthritis Mother   . Heart attack Father   . Hypertension Father   . Hyperlipidemia Father   . Hypertension Brother   . Cancer Brother   . Heart disease Brother   . Cancer Paternal Grandmother     stomach  . Diabetes Paternal Grandfather     Allergies  Allergen Reactions  . Niacin And Related Other (See Comments)    Whelps and skin flushed, mouth tingling    Medication list has been reviewed and updated.  Current Outpatient Prescriptions on File Prior to Visit  Medication Sig Dispense Refill  . aspirin EC 81 MG tablet Take 1 tablet (81 mg total) by mouth daily. Resume 5 days post-op      . cholecalciferol (VITAMIN D) 1000 UNITS tablet Take 1,000 Units by mouth daily.      . clopidogrel (PLAVIX) 75 MG tablet Take 1 tablet  (75 mg total) by mouth every morning.  90 tablet  3  . cyclobenzaprine (FLEXERIL) 10 MG tablet Take 1 tablet (10 mg total) by mouth 3 (three) times daily as needed for muscle spasms.  30 tablet  1  . diazepam (VALIUM) 2 MG tablet Take 1 tablet (2 mg total) by mouth every 8 (eight) hours as needed. For muscle spasm  30 tablet  0  . famotidine (PEPCID) 20 MG tablet TAKE 1 TABLET BY MOUTH TWICE DAILY  180 tablet  3  . ferrous sulfate 325 (65 FE) MG tablet Take 325 mg by mouth daily with breakfast.      . hydrochlorothiazide (HYDRODIURIL) 25 MG tablet Take 1 tablet (25 mg total) by mouth every morning.  90 tablet  3  . loratadine (CLARITIN) 10 MG tablet Take 10 mg by mouth daily.      . metFORMIN (GLUCOPHAGE XR) 500 MG 24 hr tablet Take 1 tablet (500 mg total) by mouth daily with breakfast.  60 tablet  3  . oxyCODONE-acetaminophen (PERCOCET) 7.5-325 MG per tablet Take 1-2 tablets by mouth every 4 (four) hours as needed for pain.  50 tablet  0  . potassium chloride SA (K-DUR,KLOR-CON) 20 MEQ tablet Take 1 tablet (20 mEq total) by mouth daily.  90 tablet  3  . simvastatin (ZOCOR) 40 MG tablet Take 1 tablet (40 mg total) by mouth every evening.  90 tablet  3  . telmisartan (MICARDIS) 40 MG tablet Take 1 tablet (40 mg total) by mouth every morning.  90 tablet  3  . traMADol (ULTRAM) 50 MG tablet TAKE 1 TABLET BY MOUTH EVERY 6 HOURS AS NEEDED FOR PAIN  30 tablet  1  . zolpidem (AMBIEN) 10 MG tablet Take 0.5 tablets (5 mg total) by mouth at bedtime as needed for sleep.  30 tablet  0  . albuterol (PROAIR HFA) 108 (90 BASE) MCG/ACT inhaler Inhale 2 puffs into the lungs every 6 (six) hours as needed for wheezing or shortness of breath.  1 Inhaler  0  . Dextromethorphan-Guaifenesin (MUCINEX DM MAXIMUM STRENGTH) 60-1200 MG TB12 Take 1 tablet by mouth 2 (two) times daily.  14 each  0   No current facility-administered medications on file prior to visit.    Review of Systems:  As per HPI- otherwise  negative.   Physical Examination: Filed Vitals:   12/01/13 1115  BP: 118/74  Pulse: 68  Temp: 97.4 F (36.3 C)  Resp: 16   Filed Vitals:   12/01/13 1115  Height:  (1.575 m)  Weight: 210 lb 12.8 oz (95.618 kg)  Body mass index is 38.55 kg/(m^2). Ideal Body Weight: Weight in (lb) to have BMI = 25: 136.4  GEN: WDWN, NAD, Non-toxic, A & O x 3, obese, looks well HEENT: Atraumatic, Normocephalic. Neck supple. No masses, No LAD. Ears and Nose: No external deformity. CV: RRR, No M/G/R. No JVD. No thrill. No extra heart sounds. PULM: CTA B, no wheezes, crackles, rhonchi. No retractions. No resp. distress. No accessory muscle use. EXTR: No c/c/e NEURO Normal gait.  PSYCH: Normally interactive. Conversant. Not depressed or anxious appearing.  Calm demeanor.  She has mild yeast rash under the left breast in the skin folds.   This does appear to be healing, shows some hyperpigmentation   Minim- mental state exam score 29/30.  She made one mistake in spelling world backwards.  She did have some trouble with clock drawing as well.  Placed her numbers well but made a mistake in setting the hands   Assessment and Plan: Type II or unspecified type diabetes mellitus with unspecified complication, uncontrolled - Plan: POCT glycosylated hemoglobin (Hb A1C)  Immunization due - Plan: Flu Vaccine QUAD 36+ mos IM  Memory change - Plan: Ambulatory referral to Neurology  Need for prophylactic vaccination and inoculation against influenza  Reassured that her DM is still under good control. Try adding an OTC antifungal to help clear up her rash. Memory concerns- will refer to neurology for further evaluation   Signed Abbe Amsterdam, MD

## 2013-12-03 ENCOUNTER — Other Ambulatory Visit: Payer: Self-pay | Admitting: Family Medicine

## 2013-12-20 ENCOUNTER — Ambulatory Visit (INDEPENDENT_AMBULATORY_CARE_PROVIDER_SITE_OTHER): Payer: 59 | Admitting: Diagnostic Neuroimaging

## 2013-12-20 ENCOUNTER — Encounter: Payer: Self-pay | Admitting: Diagnostic Neuroimaging

## 2013-12-20 VITALS — BP 123/63 | HR 78 | Temp 97.8°F | Ht 61.0 in | Wt 223.4 lb

## 2013-12-20 DIAGNOSIS — R413 Other amnesia: Secondary | ICD-10-CM

## 2013-12-20 NOTE — Patient Instructions (Signed)
I will check additional studies.  Consider evaluation of stress, anxiety, depression with psychiatry/psychology.

## 2013-12-20 NOTE — Progress Notes (Signed)
[facility]  PATIENT: [name] DOB: [birth year]  REFERRING CLINICIAN: Copeland HISTORY FROM: patient  REASON FOR VISIT: new consult   HISTORICAL  CHIEF COMPLAINT:  Chief Complaint  Patient presents with  . Memory Loss    HISTORY OF PRESENT ILLNESS:   63 year old right-handed female here for a list of memory loss. Patient noticed memory problems since 01/26/2013. Husband noticed problems since February 2015. Patient having short-term memory problems, poor concentration attention, in the setting of significantly increased psychosocial stressors. She has had more trouble driving and cooking recently.   Patient was taking care of her brother for the past one year who was diagnosed with pancreatic cancer. He deteriorated and ultimately passed away in 01-26-14. Patient placed some blame on herself for his death, related to recommending morphine drip. During his funeral, patient stepped into a hole in the ground and developed left foot, ankle injury and plantar fascia rupture. Related to this patient was unable to work. She had to have premature retirement. This contributed to increasing stress, depression and anxiety. Patient also had a fall at the beats with head trauma, low back pain and disc bulging. Patient had lumbar spine surgery, L4, L5, S1 microdecompression 2014.  Patient has family history of dementia in her mother and father.    REVIEW OF SYSTEMS: Full 14 system review of systems performed and notable only for memory loss confusion insomnia snoring restless legs increased thirst joint swelling anemia wheezing constipation trouble swallowing weight gain swelling of legs.  ALLERGIES: Allergies  Allergen Reactions  . Niacin And Related Other (See Comments)    Whelps and skin flushed, mouth tingling    HOME MEDICATIONS: Outpatient Prescriptions Prior to Visit  Medication Sig Dispense Refill  . albuterol (PROAIR HFA) 108 (90 BASE) MCG/ACT  inhaler Inhale 2 puffs into the lungs every 6 (six) hours as needed for wheezing or shortness of breath.  1 Inhaler  0  . aspirin EC 81 MG tablet Take 1 tablet (81 mg total) by mouth daily. Resume 5 days post-op      . cholecalciferol (VITAMIN D) 1000 UNITS tablet Take 1,000 Units by mouth daily.      . clopidogrel (PLAVIX) 75 MG tablet Take 1 tablet (75 mg total) by mouth every morning.  90 tablet  3  . cyclobenzaprine (FLEXERIL) 10 MG tablet Take 1 tablet (10 mg total) by mouth 3 (three) times daily as needed for muscle spasms.  30 tablet  1  . Dextromethorphan-Guaifenesin (MUCINEX DM MAXIMUM STRENGTH) 60-1200 MG TB12 Take 1 tablet by mouth 2 (two) times daily.  14 each  0  . diazepam (VALIUM) 2 MG tablet Take 1 tablet (2 mg total) by mouth every 8 (eight) hours as needed. For muscle spasm  30 tablet  0  . famotidine (PEPCID) 20 MG tablet TAKE 1 TABLET BY MOUTH TWICE DAILY  180 tablet  3  . ferrous sulfate 325 (65 FE) MG tablet Take 325 mg by mouth daily with breakfast.      . hydrochlorothiazide (HYDRODIURIL) 25 MG tablet Take 1 tablet (25 mg total) by mouth every morning.  90 tablet  3  . loratadine (CLARITIN) 10 MG tablet Take 10 mg by mouth daily.      . metFORMIN (GLUCOPHAGE XR) 500 MG 24 hr tablet Take 1 tablet (500 mg total) by mouth daily with breakfast.  60 tablet  3  . potassium chloride SA (K-DUR,KLOR-CON) 20 MEQ tablet Take 1 tablet (20 mEq total) by  mouth daily.  90 tablet  3  . simvastatin (ZOCOR) 40 MG tablet Take 1 tablet (40 mg total) by mouth every evening.  90 tablet  3  . telmisartan (MICARDIS) 40 MG tablet Take 1 tablet (40 mg total) by mouth every morning.  90 tablet  3  . traMADol (ULTRAM) 50 MG tablet TAKE 1 TABLET BY MOUTH EVERY 6 HOURS AS NEEDED FOR PAIN  30 tablet  1  . TRUETEST TEST test strip TEST ONCE DAILY AS DIRECTED  100 each  3  . zolpidem (AMBIEN) 10 MG tablet Take 0.5 tablets (5 mg total) by mouth at bedtime as needed for sleep.  30 tablet  0   No  facility-administered medications prior to visit.    PAST MEDICAL HISTORY: Past Medical History  Diagnosis Date  . Anemia   . Diabetes mellitus without complication   . Hypertension   . Hyperlipidemia LDL goal < 70   . CAD (coronary artery disease)     PCI to LAD in 2010, mild residual disease in LCX and RCA  . GERD (gastroesophageal reflux disease)   . Plantar fascia rupture     Left Foot    PAST SURGICAL HISTORY: Past Surgical History  Procedure Laterality Date  . Tympanoplasty Bilateral   . Carotid stent  2009  . Doppler echocardiography  08/01/2009    EF=>55%,LV normal  . Nm myocar perf wall motion  09/22/2008    lexiscan-EF 83%; glogal LV systolic fx is norm. ,evidence of mild ischemia basal anterior,midanterior and apical lateral region(s).   . Lower arterial duplex  06/20/10    abi's normal,rgt 0.98,lft 1.06;bilateral PVRs normal  . Abdominal hysterectomy    . Coronary angioplasty with stent placement  2010and 10-02-2012    Stent DES, Xience to prox. LAD  . Lumbar laminectomy/decompression microdiscectomy Left 01/06/2013    Procedure: MICRO LUMBAR DECOMPRESSION L4-5 AND L5-S1;  Surgeon: Javier Docker, MD;  Location: [facility];  Service: Orthopedics;  Laterality: Left;    FAMILY HISTORY: Family History  Problem Relation Age of Onset  . Coronary artery disease Mother   . Rheum arthritis Mother   . Heart attack Father   . Hypertension Father   . Hyperlipidemia Father   . Hypertension Brother   . Cancer Brother   . Heart disease Brother   . Cancer Paternal Grandmother     stomach  . Diabetes Paternal Grandfather     SOCIAL HISTORY:  History   Social History  . Marital Status: Married    Spouse Name: N/A    Number of Children: N/A  . Years of Education: N/A   Occupational History  . Not on file.   Social History Main Topics  . Smoking status: Never Smoker   . Smokeless tobacco: Never Used  . Alcohol Use: Yes     Comment: occasional, 1 a month wine    . Drug Use: No  . Sexual Activity: Yes   Other Topics Concern  . Not on file   Social History Narrative   Married. Education: [employer]. Exercise: Yes     PHYSICAL EXAM  There were no vitals filed for this visit.  Not recorded    There is no weight on file to calculate BMI.  GENERAL EXAM: Patient is in no distress; well developed, nourished and groomed; neck is supple  CARDIOVASCULAR: Regular rate and rhythm, no murmurs, no carotid bruits  NEUROLOGIC: MENTAL STATUS: awake, alert, oriented to person, place and time, recent and remote memory  intact, normal attention and concentration, language fluent, comprehension intact, naming intact, fund of knowledge appropriate; MMSE 30/30; MOCA 24/30 (MISSES TRAILS, CUBE, CLOCK, ABSTRACTION, 1 ON RECALL, FLUENCY (NAMES 10 WORDS WITH F). NO FRONTAL RELEASE SIGNS. SLIGHTLY ANXIOUS APPEARING. CRANIAL NERVE: no papilledema on fundoscopic exam, pupils equal and reactive to light, visual fields full to confrontation, extraocular muscles intact, no nystagmus, facial sensation and strength symmetric, hearing intact, palate elevates symmetrically, uvula midline, shoulder shrug symmetric, tongue midline. MOTOR: normal bulk and tone, full strength in the BUE, BLE SENSORY: normal and symmetric to light touch, pinprick, temperature, vibration and proprioception COORDINATION: finger-nose-finger, fine finger movements normal REFLEXES: deep tendon reflexes present and symmetric GAIT/STATION: narrow based gait; LIMPS ON LEFT FOOT   DIAGNOSTIC DATA (LABS, IMAGING, TESTING) - I reviewed patient records, labs, notes, testing and imaging myself where available.  Lab Results  Component Value Date   WBC 9.8 09/27/2013   HGB 12.5 09/27/2013   HCT 37.3 09/27/2013   MCV 76.0* 09/27/2013   PLT 336 09/27/2013      Component Value Date/Time   NA 141 09/27/2013 0846   K 4.3 09/27/2013 0846   CL 103 09/27/2013 0846   CO2 23 09/27/2013 0846   GLUCOSE 119* 09/27/2013 0846   BUN  17 09/27/2013 0846   CREATININE 0.73 09/27/2013 0846   CREATININE 0.70 01/07/2013 0550   CALCIUM 9.9 09/27/2013 0846   PROT 6.8 09/27/2013 0846   ALBUMIN 4.3 09/27/2013 0846   AST 13 09/27/2013 0846   ALT 15 09/27/2013 0846   ALKPHOS 138* 09/27/2013 0846   BILITOT 0.5 09/27/2013 0846   GFRNONAA >90 01/07/2013 0550   GFRAA >90 01/07/2013 0550   Lab Results  Component Value Date   CHOL 164 09/27/2013   HDL 31* 09/27/2013   LDLCALC 66 09/27/2013   TRIG 333* 09/27/2013   CHOLHDL 5.3 09/27/2013   Lab Results  Component Value Date   HGBA1C 6.3 12/01/2013   No results found for this basename: [id number]   Lab Results  Component Value Date   TSH 1.482 09/27/2013    I reviewed images myself and agree with interpretation. -VRP  12/07/12 CT head - Negative head CT.  12/07/12 CT cervical spine - No acute bony injury in the cervical spine.   ASSESSMENT AND PLAN  63 y.o. year old female here with short term memory loss, poor attention/focus, since Nov 2014, in setting of increased personal stress (brother's illness and death, left foot injury, inability to work and premature retirement).   Ddx: stress, anxiety/depression, pain, sleep disruption (freq urination at night, snoring, anxiety), metabolic, vascular, structural, inflamm, neurodegenerative  PLAN:  Orders Placed This Encounter  Procedures  . MR Brain Wo Contrast  . Vitamin B12  . Ambulatory referral to Sleep Studies   Return in about 3 months (around 03/21/2014).    Suanne Marker, MD 12/20/2013, 10:28 AM Certified in Neurology, Neurophysiology and Neuroimaging  [facility] [address] [place], [place] [address] [phone]

## 2013-12-22 ENCOUNTER — Telehealth: Payer: Self-pay | Admitting: Neurology

## 2013-12-22 DIAGNOSIS — I25118 Atherosclerotic heart disease of native coronary artery with other forms of angina pectoris: Secondary | ICD-10-CM

## 2013-12-22 DIAGNOSIS — R0683 Snoring: Secondary | ICD-10-CM

## 2013-12-22 LAB — VITAMIN B12: Vitamin B-12: 924 pg/mL (ref 211–946)

## 2013-12-22 NOTE — Telephone Encounter (Signed)
Dr. Marjory LiesPenumalli  ,refers patient for attended sleep study.  Height: 5\' 1"   Weight: 223lbs 6.4oz  BMI: 42.23  Past Medical History:  Anemia  .  Diabetes mellitus without complication  .  Hypertension  .  Hyperlipidemia LDL goal < 70  .  CAD (coronary artery disease)  PCI to LAD in 2010, mild residual disease in LCX and RCA  .  GERD (gastroesophageal reflux disease)  .  Plantar fascia rupture  Left Foot    Sleep Symptoms: memory loss, confusion, insomnia, snoring, restless legs. Ddx: stress, anxiety/depression, pain, sleep disruption (freq urination at night, snoring, anxiety)    Epworth Score: Unable to reach patient   Medication: Aspirin (Tablet Delayed Response) aspirin EC 81 MG Take 1 tablet (81 mg total) by mouth daily. Resume 5 days post-op Cholecalciferol (Tab) VITAMIN D 1000 UNITS Take 1,000 Units by mouth daily. Clopidogrel Bisulfate (Tab) PLAVIX 75 MG Take 1 tablet (75 mg total) by mouth every morning. Cyclobenzaprine HCl (Tab) FLEXERIL 10 MG Take 1 tablet (10 mg total) by mouth 3 (three) times daily as needed for muscle spasms. Diazepam (Tab) VALIUM 2 MG Take 1 tablet (2 mg total) by mouth every 8 (eight) hours as needed. For muscle spasm Famotidine (Tab) PEPCID 20 MG TAKE 1 TABLET BY MOUTH TWICE DAILY Ferrous Sulfate (Tab) ferrous sulfate 325 (65 FE) MG Take 325 mg by mouth daily with breakfast. Glucose Blood (Strip) TRUETEST TEST TEST ONCE DAILY AS DIRECTED Hydrochlorothiazide (Tab) HYDRODIURIL 25 MG Take 1 tablet (25 mg total) by mouth every morning. Loratadine (Tab) CLARITIN 10 MG Take 10 mg by mouth daily. MetFORMIN HCl (Tablet SR 24 hr) GLUCOPHAGE-XR 500 MG Take 1 tablet (500 mg total) by mouth daily with breakfast. Potassium Chloride Crys CR (Tab CR) K-DUR,KLOR-CON 20 MEQ Take 1 tablet (20 mEq total) by mouth daily. Simvastatin (Tab) ZOCOR 40 MG Take 1 tablet (40 mg total) by mouth every evening. Telmisartan (Tab) MICARDIS 40 MG Take 1 tablet (40 mg total) by mouth every  morning. TraMADol HCl (Tab) ULTRAM 50 MG TAKE 1 TABLET BY MOUTH EVERY 6 HOURS AS NEEDED FOR PAIN Zolpidem Tartrate (Tab) AMBIEN 10 MG Take 0.5 tablets (5 mg total) by mouth at bedtime as needed for sleep.     Ins: UMR   Assessment & Plan: 63 y.o. year old female here with short term memory loss, poor attention/focus, since Nov 2014, in setting of increased personal stress (brother's illness and death, left foot injury, inability to work and premature retirement).  Ddx: stress, anxiety/depression, pain, sleep disruption (freq urination at night, snoring, anxiety), metabolic, vascular, structural, inflamm, neurodegenerative  PLAN:   Orders Placed This Encounter   Procedures   .  MR Brain Wo Contrast   .  Vitamin B12   .  Ambulatory referral to Sleep Studies   Return in about 3 months (around 03/21/2014).   Please review patient information and submit instructions for scheduling and orders for sleep technologist. Thank you.

## 2013-12-29 ENCOUNTER — Ambulatory Visit (INDEPENDENT_AMBULATORY_CARE_PROVIDER_SITE_OTHER): Payer: 59

## 2013-12-29 DIAGNOSIS — R413 Other amnesia: Secondary | ICD-10-CM

## 2014-01-04 NOTE — Telephone Encounter (Signed)
CAD, morbid obesity and HTN, DM ,   Needs SPLIT with CO2.

## 2014-01-07 ENCOUNTER — Encounter: Payer: Self-pay | Admitting: Family Medicine

## 2014-01-19 ENCOUNTER — Telehealth: Payer: Self-pay | Admitting: Diagnostic Neuroimaging

## 2014-01-19 NOTE — Telephone Encounter (Signed)
Patient calling to request MRI results from 12/29/13, please return call and advise.

## 2014-01-21 NOTE — Telephone Encounter (Signed)
I called patient; gave results of MRI on voicemail. -VRP

## 2014-01-21 NOTE — Telephone Encounter (Signed)
Please see phone note  

## 2014-02-13 ENCOUNTER — Ambulatory Visit (INDEPENDENT_AMBULATORY_CARE_PROVIDER_SITE_OTHER): Payer: 59 | Admitting: Neurology

## 2014-02-13 DIAGNOSIS — R0683 Snoring: Secondary | ICD-10-CM

## 2014-02-13 DIAGNOSIS — G4733 Obstructive sleep apnea (adult) (pediatric): Secondary | ICD-10-CM

## 2014-02-13 DIAGNOSIS — I25118 Atherosclerotic heart disease of native coronary artery with other forms of angina pectoris: Secondary | ICD-10-CM

## 2014-02-15 NOTE — Sleep Study (Signed)
Please see the scanned sleep study interpretation located in the Procedure tab within the Chart Review section. 

## 2014-02-16 ENCOUNTER — Other Ambulatory Visit: Payer: Self-pay

## 2014-02-16 MED ORDER — METFORMIN HCL ER 500 MG PO TB24: 500.0000 mg | ORAL_TABLET | Freq: Every day | ORAL | Status: DC

## 2014-02-22 DIAGNOSIS — G4733 Obstructive sleep apnea (adult) (pediatric): Secondary | ICD-10-CM | POA: Insufficient documentation

## 2014-02-23 ENCOUNTER — Other Ambulatory Visit: Payer: Self-pay | Admitting: Neurology

## 2014-02-23 ENCOUNTER — Telehealth: Payer: Self-pay | Admitting: *Deleted

## 2014-02-23 ENCOUNTER — Encounter: Payer: Self-pay | Admitting: Neurology

## 2014-02-23 DIAGNOSIS — G4733 Obstructive sleep apnea (adult) (pediatric): Secondary | ICD-10-CM

## 2014-02-23 NOTE — Telephone Encounter (Signed)
Patient was contacted and provided the results of her overnight sleep study that revealed obstructive sleep apnea.  Patient was advised that a CPAP titration study had been recommended.  Patient was in agreement and scheduled her CPAP titration study for 03/30/2014.  The patient gave verbal permission to mail a copy of her test results.  Dr. Joycelyn SchmidVikram Penumalli was routed a copy of the results.

## 2014-03-01 ENCOUNTER — Ambulatory Visit (INDEPENDENT_AMBULATORY_CARE_PROVIDER_SITE_OTHER): Payer: 59 | Admitting: Diagnostic Neuroimaging

## 2014-03-01 ENCOUNTER — Encounter: Payer: Self-pay | Admitting: Diagnostic Neuroimaging

## 2014-03-01 VITALS — BP 132/60 | HR 61 | Ht 61.0 in | Wt 223.6 lb

## 2014-03-01 DIAGNOSIS — G4733 Obstructive sleep apnea (adult) (pediatric): Secondary | ICD-10-CM

## 2014-03-01 DIAGNOSIS — R413 Other amnesia: Secondary | ICD-10-CM

## 2014-03-01 NOTE — Progress Notes (Signed)
[facility]  PATIENT: [name] DOB: Sep 04, 1950  REFERRING CLINICIAN: Copeland HISTORY FROM: patient  REASON FOR VISIT: follow up   HISTORICAL  CHIEF COMPLAINT:  Chief Complaint  Patient presents with  . Follow-up    memory loss    HISTORY OF PRESENT ILLNESS:   UPDATE 03/01/14: Since last visit, feeling better. Still with some stress, memory loss, but getting better. Had sleep study and dx'd with sleep apnea, planning to have titration study in Jan 2016 for CPAP.  PRIOR HPI (12/20/13): 63 year old right-handed female here for a list of memory loss. Patient noticed memory problems since 02-18-2013. Husband noticed problems since February 2015. Patient having short-term memory problems, poor concentration attention, in the setting of significantly increased psychosocial stressors. She has had more trouble driving and cooking recently.  Patient was taking care of her brother for the past one year who was diagnosed with pancreatic cancer. He deteriorated and ultimately passed away in 02-18-2014. Patient placed some blame on herself for his death, related to recommending morphine drip. During his funeral, patient stepped into a hole in the ground and developed left foot, ankle injury and plantar fascia rupture. Related to this patient was unable to work. She had to have premature retirement. This contributed to increasing stress, depression and anxiety. Patient also had a fall at the [facility] with head trauma, low back pain and disc bulging. Patient had lumbar spine surgery, L4, L5, S1 microdecompression 2014. Patient has family history of dementia in her mother and father.    REVIEW OF SYSTEMS: Full 14 system review of systems performed and notable only for memory loss muscle cramps restless legs.   ALLERGIES: Allergies  Allergen Reactions  . Niacin And Related Other (See Comments)    Whelps and skin flushed, mouth tingling  . Tolectin [Tolmetin]     HOME  MEDICATIONS: Outpatient Prescriptions Prior to Visit  Medication Sig Dispense Refill  . aspirin EC 81 MG tablet Take 1 tablet (81 mg total) by mouth daily. Resume 5 days post-op    . cholecalciferol (VITAMIN D) 1000 UNITS tablet Take 1,000 Units by mouth daily.    . clopidogrel (PLAVIX) 75 MG tablet Take 1 tablet (75 mg total) by mouth every morning. 90 tablet 3  . diazepam (VALIUM) 2 MG tablet Take 1 tablet (2 mg total) by mouth every 8 (eight) hours as needed. For muscle spasm 30 tablet 0  . famotidine (PEPCID) 20 MG tablet TAKE 1 TABLET BY MOUTH TWICE DAILY 180 tablet 3  . ferrous sulfate 325 (65 FE) MG tablet Take 325 mg by mouth daily with breakfast.    . hydrochlorothiazide (HYDRODIURIL) 25 MG tablet Take 1 tablet (25 mg total) by mouth every morning. 90 tablet 3  . metFORMIN (GLUCOPHAGE XR) 500 MG 24 hr tablet Take 1 tablet (500 mg total) by mouth daily with breakfast. 180 tablet 0  . potassium chloride SA (K-DUR,KLOR-CON) 20 MEQ tablet Take 1 tablet (20 mEq total) by mouth daily. 90 tablet 3  . simvastatin (ZOCOR) 40 MG tablet Take 1 tablet (40 mg total) by mouth every evening. 90 tablet 3  . telmisartan (MICARDIS) 40 MG tablet Take 1 tablet (40 mg total) by mouth every morning. 90 tablet 3  . traMADol (ULTRAM) 50 MG tablet TAKE 1 TABLET BY MOUTH EVERY 6 HOURS AS NEEDED FOR PAIN 30 tablet 1  . TRUETEST TEST test strip TEST ONCE DAILY AS DIRECTED 100 each 3  . zolpidem (AMBIEN) 10 MG tablet  Take 0.5 tablets (5 mg total) by mouth at bedtime as needed for sleep. 30 tablet 0  . cyclobenzaprine (FLEXERIL) 10 MG tablet Take 1 tablet (10 mg total) by mouth 3 (three) times daily as needed for muscle spasms. (Patient not taking: Reported on 03/01/2014) 30 tablet 1  . loratadine (CLARITIN) 10 MG tablet Take 10 mg by mouth daily.     No facility-administered medications prior to visit.    PAST MEDICAL HISTORY: Past Medical History  Diagnosis Date  . Anemia   . Diabetes mellitus without  complication   . Hypertension   . Hyperlipidemia LDL goal < 70   . CAD (coronary artery disease)     PCI to LAD in 2010, mild residual disease in LCX and RCA  . GERD (gastroesophageal reflux disease)   . Plantar fascia rupture     Left Foot  . Lumbar back pain     PAST SURGICAL HISTORY: Past Surgical History  Procedure Laterality Date  . Tympanoplasty Bilateral   . Carotid stent  2009  . Doppler echocardiography  08/01/2009    EF=>55%,LV normal  . Nm myocar perf wall motion  09/22/2008    lexiscan-EF 83%; glogal LV systolic fx is norm. ,evidence of mild ischemia basal anterior,midanterior and apical lateral region(s).   . Lower arterial duplex  06/20/10    abi's normal,rgt 0.98,lft 1.06;bilateral PVRs normal  . Abdominal hysterectomy    . Coronary angioplasty with stent placement  2010and 10-02-2012    Stent DES, Xience to prox. LAD  . Lumbar laminectomy/decompression microdiscectomy Left 01/06/2013    Procedure: MICRO LUMBAR DECOMPRESSION L4-5 AND L5-S1;  Surgeon: Javier DockerJeffrey C Beane, MD;  Location: [facility];  Service: Orthopedics;  Laterality: Left;    FAMILY HISTORY: Family History  Problem Relation Age of Onset  . Coronary artery disease Mother   . Rheum arthritis Mother   . Dementia Mother   . Heart attack Father   . Hypertension Father   . Hyperlipidemia Father   . Other Father     MVA  . Hypertension Brother   . Cancer Brother   . Heart disease Brother   . Cancer Paternal Grandmother     stomach  . Diabetes Paternal Grandfather     SOCIAL HISTORY:  History   Social History  . Marital Status: Married    Spouse Name: [name]    Number of Children: 2  . Years of Education: MSN   Occupational History  . [employer]   Social History Main Topics  . Smoking status: Never Smoker   . Smokeless tobacco: Never Used  . Alcohol Use: Yes     Comment: occasional, 1 a month wine  . Drug Use: No  . Sexual Activity: Yes   Other Topics Concern  . Not on  file   Social History Narrative      Married. Education: [employer]. Exercise: Yes   Patient lives at home with her spouse.   Caffeine use: occasionally     PHYSICAL EXAM  Filed Vitals:   03/01/14 0828  BP: 132/60  Pulse: 61  Height: 5\' 1"  (1.549 m)  Weight: 223 lb 9.6 oz (101.424 kg)    Not recorded      Body mass index is 42.27 kg/(m^2).   MMSE - Mini Mental State Exam 12/20/2013  Orientation to time 5  Orientation to Place 5  Registration 3  Attention/ Calculation 5  Recall 3  Language- name 2 objects 2  Language- repeat 1  Language- follow 3 step command 3  Language- read & follow direction 1  Write a sentence 1  Copy design 1  Total score 30    Montreal Cognitive Assessment  03/01/2014 12/20/2013  Visuospatial/ Executive (0/5) 2 2  Naming (0/3) 3 3  Attention: Read list of digits (0/2) 2 2  Attention: Read list of letters (0/1) 1 1  Attention: Serial 7 subtraction starting at 100 (0/3) 3 3  Language: Repeat phrase (0/2) 1 2  Language : Fluency (0/1) 1 0  Abstraction (0/2) 2 1  Delayed Recall (0/5) 3 4  Orientation (0/6) 6 6  Total 24 24  Adjusted Score (based on education) 24 24    GENERAL EXAM: Patient is in no distress; well developed, nourished and groomed; neck is supple  CARDIOVASCULAR: Regular rate and rhythm, no murmurs, no carotid bruits  NEUROLOGIC: MENTAL STATUS: awake, alert, language fluent, comprehension intact, naming intact, fund of knowledge appropriate; NO FRONTAL RELEASE SIGNS.   CRANIAL NERVE: no papilledema on fundoscopic exam, pupils equal and reactive to light, visual fields full to confrontation, extraocular muscles intact, no nystagmus, facial sensation and strength symmetric, hearing intact, palate elevates symmetrically, uvula midline, shoulder shrug symmetric, tongue midline. MOTOR: normal bulk and tone, full strength in the BUE, BLE SENSORY: normal and symmetric to light touch, pinprick, temperature, vibration and  proprioception COORDINATION: finger-nose-finger, fine finger movements normal REFLEXES: deep tendon reflexes present and symmetric GAIT/STATION: narrow based gait; LIMPS ON LEFT FOOT   DIAGNOSTIC DATA (LABS, IMAGING, TESTING) - I reviewed patient records, labs, notes, testing and imaging myself where available.  Lab Results  Component Value Date   WBC 9.8 09/27/2013   HGB 12.5 09/27/2013   HCT 37.3 09/27/2013   MCV 76.0* 09/27/2013   PLT 336 09/27/2013      Component Value Date/Time   NA 141 09/27/2013 0846   K 4.3 09/27/2013 0846   CL 103 09/27/2013 0846   CO2 23 09/27/2013 0846   GLUCOSE 119* 09/27/2013 0846   BUN 17 09/27/2013 0846   CREATININE 0.73 09/27/2013 0846   CREATININE 0.70 01/07/2013 0550   CALCIUM 9.9 09/27/2013 0846   PROT 6.8 09/27/2013 0846   ALBUMIN 4.3 09/27/2013 0846   AST 13 09/27/2013 0846   ALT 15 09/27/2013 0846   ALKPHOS 138* 09/27/2013 0846   BILITOT 0.5 09/27/2013 0846   GFRNONAA >90 01/07/2013 0550   GFRAA >90 01/07/2013 0550   Lab Results  Component Value Date   CHOL 164 09/27/2013   HDL 31* 09/27/2013   LDLCALC 66 09/27/2013   TRIG 333* 09/27/2013   CHOLHDL 5.3 09/27/2013   Lab Results  Component Value Date   HGBA1C 6.3 12/01/2013   Lab Results  Component Value Date   VITAMINB12 924 12/20/2013   Lab Results  Component Value Date   TSH 1.482 09/27/2013    I reviewed images myself and agree with interpretation. -VRP  12/07/12 CT head - Negative head CT.  12/07/12 CT cervical spine - No acute bony injury in the cervical spine.  12/29/13 MRI brain 1. Few subcortical and juxtacortical foci of non-specific gliosis, likely chronic small vessel ischemic disease.  2. No acute findings.   ASSESSMENT AND PLAN  63 y.o. year old female here with short term memory loss, poor attention/focus, since Nov 2014, in setting of increased personal stress (brother's illness and death, left foot injury, inability to work and premature  retirement). Doing slightly better than at last visit. Now diagnosed with sleep apnea.  Dx: memory loss due to stress, anxiety/depression and sleep apnea  Patient Active Problem List   Diagnosis Date Noted  . OSA (obstructive sleep apnea) 02/22/2014  . Spinal stenosis, lumbar region, with neurogenic claudication 01/06/2013  . Obesity, morbid, BMI 40.0-49.9 10/20/2012  . Chest pain 10/02/2012  . CAD (coronary artery disease)PCI to LAD in 2010, mild residual disease in LCX and RCA   . Hyperlipidemia LDL goal < 70   . Diabetes mellitus, type 2 04/16/2012  . HTN (hypertension) 04/16/2012    PLAN: - continue CPAP titration study in Jan 2016 - stay active mentally and physically  Return in about 6 months (around 08/31/2014), or if symptoms worsen or fail to improve.    Suanne MarkerVIKRAM R. Aquita Simmering, MD 03/01/2014, 8:50 AM Certified in Neurology, Neurophysiology and Neuroimaging  [facility] [address], [address] [place], [place] [id number] [phone]

## 2014-03-01 NOTE — Patient Instructions (Signed)
Stay active mentally and physically.

## 2014-03-03 ENCOUNTER — Encounter (HOSPITAL_COMMUNITY): Payer: Self-pay | Admitting: Cardiovascular Disease

## 2014-03-28 ENCOUNTER — Ambulatory Visit (INDEPENDENT_AMBULATORY_CARE_PROVIDER_SITE_OTHER): Payer: 59 | Admitting: Neurology

## 2014-03-28 DIAGNOSIS — G4733 Obstructive sleep apnea (adult) (pediatric): Secondary | ICD-10-CM

## 2014-03-28 NOTE — Sleep Study (Signed)
Please see the scanned sleep study interpretation located in the Procedure tab within the Chart Review section. 

## 2014-03-29 ENCOUNTER — Telehealth: Payer: Self-pay | Admitting: Neurology

## 2014-03-29 ENCOUNTER — Other Ambulatory Visit: Payer: Self-pay | Admitting: Neurology

## 2014-03-29 DIAGNOSIS — G4733 Obstructive sleep apnea (adult) (pediatric): Secondary | ICD-10-CM

## 2014-03-29 NOTE — Telephone Encounter (Signed)
Patient was contacted and provided the results of her CPAP titration study that was considered successful treating her sleep apnea.  The patient was informed that treatment had been recommended for home use.  The patient was in agreement and referred to [facility] for DME referral.  The patient gave verbal permission to mail a copy of her test results.  Dr. Shanda BumpsJessica Copland was routed a copy of the results.   Patient instructed to contact our office 6-8 weeks post set up to schedule a follow up appointment.

## 2014-04-22 ENCOUNTER — Other Ambulatory Visit: Payer: Self-pay | Admitting: Family Medicine

## 2014-04-22 DIAGNOSIS — Z1231 Encounter for screening mammogram for malignant neoplasm of breast: Secondary | ICD-10-CM

## 2014-04-28 ENCOUNTER — Ambulatory Visit (INDEPENDENT_AMBULATORY_CARE_PROVIDER_SITE_OTHER): Payer: 59 | Admitting: Physician Assistant

## 2014-04-28 VITALS — BP 118/70 | HR 71 | Temp 98.3°F | Resp 18 | Ht 61.75 in | Wt 225.4 lb

## 2014-04-28 DIAGNOSIS — D72820 Lymphocytosis (symptomatic): Secondary | ICD-10-CM

## 2014-04-28 DIAGNOSIS — R0981 Nasal congestion: Secondary | ICD-10-CM

## 2014-04-28 DIAGNOSIS — J3089 Other allergic rhinitis: Secondary | ICD-10-CM

## 2014-04-28 LAB — POCT CBC
Granulocyte percent: 54.2 %G (ref 37–80)
HCT, POC: 37.1 % — AB (ref 37.7–47.9)
Hemoglobin: 11.8 g/dL — AB (ref 12.2–16.2)
LYMPH, POC: 4.8 — AB (ref 0.6–3.4)
MCH: 25.4 pg — AB (ref 27–31.2)
MCHC: 31.7 g/dL — AB (ref 31.8–35.4)
MCV: 80 fL (ref 80–97)
MID (CBC): 0.7 (ref 0–0.9)
MPV: 7.1 fL (ref 0–99.8)
PLATELET COUNT, POC: 332 10*3/uL (ref 142–424)
POC Granulocyte: 6.5 (ref 2–6.9)
POC LYMPH PERCENT: 39.7 %L (ref 10–50)
POC MID %: 6.1 %M (ref 0–12)
RBC: 4.64 M/uL (ref 4.04–5.48)
RDW, POC: 15.9 %
WBC: 12 10*3/uL — AB (ref 4.6–10.2)

## 2014-04-28 MED ORDER — IPRATROPIUM BROMIDE 0.03 % NA SOLN: 2.0000 | Freq: Two times a day (BID) | NASAL | Status: DC

## 2014-04-28 MED ORDER — CETIRIZINE HCL 10 MG PO TABS: 10.0000 mg | ORAL_TABLET | Freq: Every day | ORAL | Status: DC

## 2014-04-28 MED ORDER — FLUTICASONE PROPIONATE 50 MCG/ACT NA SUSP: 2.0000 | Freq: Every day | NASAL | Status: DC

## 2014-04-28 NOTE — Patient Instructions (Signed)
Consider using a Netti Pot over the next few days.  Please purchase a kit and use as instructed with distilled luke warm water.   Allergic Rhinitis Allergic rhinitis is when the mucous membranes in the nose respond to allergens. Allergens are particles in the air that cause your body to have an allergic reaction. This causes you to release allergic antibodies. Through a chain of events, these eventually cause you to release histamine into the blood stream. Although meant to protect the body, it is this release of histamine that causes your discomfort, such as frequent sneezing, congestion, and an itchy, runny nose.  CAUSES  Seasonal allergic rhinitis (hay fever) is caused by pollen allergens that may come from grasses, trees, and weeds. Year-round allergic rhinitis (perennial allergic rhinitis) is caused by allergens such as house dust mites, pet dander, and mold spores.  SYMPTOMS   Nasal stuffiness (congestion).  Itchy, runny nose with sneezing and tearing of the eyes. DIAGNOSIS  Your health care provider can help you determine the allergen or allergens that trigger your symptoms. If you and your health care provider are unable to determine the allergen, skin or blood testing may be used. TREATMENT  Allergic rhinitis does not have a cure, but it can be controlled by:  Medicines and allergy shots (immunotherapy).  Avoiding the allergen. Hay fever may often be treated with antihistamines in pill or nasal spray forms. Antihistamines block the effects of histamine. There are over-the-counter medicines that may help with nasal congestion and swelling around the eyes. Check with your health care provider before taking or giving this medicine.  If avoiding the allergen or the medicine prescribed do not work, there are many new medicines your health care provider can prescribe. Stronger medicine may be used if initial measures are ineffective. Desensitizing injections can be used if medicine and  avoidance does not work. Desensitization is when a patient is given ongoing shots until the body becomes less sensitive to the allergen. Make sure you follow up with your health care provider if problems continue. HOME CARE INSTRUCTIONS It is not possible to completely avoid allergens, but you can reduce your symptoms by taking steps to limit your exposure to them. It helps to know exactly what you are allergic to so that you can avoid your specific triggers. SEEK MEDICAL CARE IF:   You have a fever.  You develop a cough that does not stop easily (persistent).  You have shortness of breath.  You start wheezing.  Symptoms interfere with normal daily activities. Document Released: 12/04/2000 Document Revised: 03/16/2013 Document Reviewed: 11/16/2012 [facility] Patient Information 2015 [place], [place]. This information is not intended to replace advice given to you by your health care provider. Make sure you discuss any questions you have with your health care provider.

## 2014-04-28 NOTE — Progress Notes (Signed)
04/28/2014 at 6:13 PM  Leanna SatoHelen J Stawicki / DOB: 1950/07/03 / MRN: [id number]  The patient has Diabetes mellitus, type 2; HTN (hypertension); Chest pain; CAD (coronary artery disease)PCI to LAD in 2010, mild residual disease in LCX and RCA; Hyperlipidemia LDL goal < 70; Obesity, morbid, BMI 40.0-49.9; Spinal stenosis, lumbar region, with neurogenic claudication; OSA (obstructive sleep apnea); and Environmental and seasonal allergies on her problem list.  SUBJECTIVE  Chief compalaint: Head Cold   History of present illness: [name] is 64 y.o. well appearing female with a pertinent medical history of diabetes and CAD presenting for the evaluation of sinus congestion. This problem has been present for about three days and is static. Associated symptoms include ear popping a pressure, along with maxillary itching, sneezing.  She denies cough, fever, chills, nausea, and SOB. She has tried multiple OTCs with poor relief.  She had a similar episode in the middle of Dec. 2015 for which she was prescribed 10 days of doxycycline, prednisone 4 mg po tabs, and also received a sinus xray, non of which is documented [place].  She reports this treatment plan did nothing for her symptoms, and eventually she started drinking pineapple juice, which seemed to help much more.    She  has a past medical history of Anemia; Diabetes mellitus without complication; Hypertension; Hyperlipidemia LDL goal < 70; CAD (coronary artery disease); GERD (gastroesophageal reflux disease); Plantar fascia rupture; and Lumbar back pain.    She has a current medication list which includes the following prescription(s): aspirin ec, cholecalciferol, clopidogrel, cyclobenzaprine, diazepam, famotidine, ferrous sulfate, hydrochlorothiazide, metformin, potassium chloride sa, simvastatin, telmisartan, tramadol, truetest test, zolpidem, cetirizine, fluticasone, ipratropium, and loratadine.  [name] is allergic to niacin and  related and tolectin. She  reports that she has never smoked. She has never used smokeless tobacco. She reports that she drinks alcohol. She reports that she does not use illicit drugs. She  reports that she currently engages in sexual activity.  The patient  has past surgical history that includes Tympanoplasty (Bilateral); Carotid stent (2009); doppler echocardiography (08/01/2009); nm myocar perf wall motion (09/22/2008); lower arterial duplex (06/20/10); Abdominal hysterectomy; Coronary angioplasty with stent (2010and 10-02-2012); Lumbar laminectomy/decompression microdiscectomy (Left, 01/06/2013); and left heart catheterization with coronary angiogram (N/A, 10/02/2012).  Her family history includes Cancer in her brother and paternal grandmother; Coronary artery disease in her mother; Dementia in her mother; Diabetes in her paternal grandfather; Heart attack in her father; Heart disease in her brother; Hyperlipidemia in her father; Hypertension in her brother and father; Other in her father; Rheum arthritis in her mother.  ROS  Per HPI  OBJECTIVE  Her  height is 5' 1.75" (1.568 m) and weight is 225 lb 6.4 oz (102.241 kg). Her oral temperature is 98.3 F (36.8 C). Her blood pressure is 118/70 and her pulse is 71. Her respiration is 18 and oxygen saturation is 96%.  The patient's body mass index is 41.58 kg/(m^2).  Physical Exam  Constitutional: Vital signs are normal. She appears well-developed and well-nourished.  Non-toxic appearance. She does not have a sickly appearance. She does not appear ill. No distress.  HENT:  Right Ear: Hearing, tympanic membrane, external ear and ear canal normal.  Left Ear: Hearing, tympanic membrane, external ear and ear canal normal.  Nose: Mucosal edema present. Right sinus exhibits no maxillary sinus tenderness and no frontal sinus tenderness. Left sinus exhibits no maxillary sinus tenderness and no frontal sinus tenderness.  Mouth/Throat: Uvula is midline,  oropharynx is clear and moist and mucous membranes are normal. No oropharyngeal exudate.  Cardiovascular: Normal rate and regular rhythm.   Respiratory: Effort normal and breath sounds normal.  GI: Soft.  Skin: She is not diaphoretic.    Results for orders placed or performed in visit on 04/28/14 (from the past 24 hour(s))  POCT CBC     Status: Abnormal   Collection Time: 04/28/14  5:49 PM  Result Value Ref Range   WBC 12.0 (A) 4.6 - 10.2 K/uL   Lymph, poc 4.8 (A) 0.6 - 3.4   POC LYMPH PERCENT 39.7 10 - 50 %L   MID (cbc) 0.7 0 - 0.9   POC MID % 6.1 0 - 12 %M   POC Granulocyte 6.5 2 - 6.9   Granulocyte percent 54.2 37 - 80 %G   RBC 4.64 4.04 - 5.48 M/uL   Hemoglobin 11.8 (A) 12.2 - 16.2 g/dL   HCT, POC 16.1 (A) 09.6 - 47.9 %   MCV 80.0 80 - 97 fL   MCH, POC 25.4 (A) 27 - 31.2 pg   MCHC 31.7 (A) 31.8 - 35.4 g/dL   RDW, POC 04.5 %   Platelet Count, POC 332 142 - 424 K/uL   MPV 7.1 0 - 99.8 fL    ASSESSMENT & PLAN  Pernell was seen today for head cold.  Diagnoses and associated orders for this visit:  Environmental and seasonal allergies - ipratropium (ATROVENT) 0.03 % nasal spray; Place 2 sprays into both nostrils 2 (two) times daily. - fluticasone (FLONASE) 50 MCG/ACT nasal spray; Place 2 sprays into both nostrils daily. Take two sprays in each nostril daily. - cetirizine (ZYRTEC) 10 MG tablet; Take 1 tablet (10 mg total) by mouth daily.  Sinus congestion - POCT CBC -     Treated with the above and have asked the patient to start using a Netti Pot.    Lymphocytosis: Found on CBC.  Negative granulocyte left shift, vitals and PE are reassuring.  Patient amenable to close f/u prn. -     Likely the common cold. Will treat aggressively for allergies and decongestion with .   She will call back to the clinic if she fails to see any improvement  with the regimen.  Will consider the addition of oxymetazoline.   The patient was instructed to to call or comeback to clinic as needed,  or should symptoms warrant.  Deliah Boston, MHS, PA-C [facility] 04/28/2014 6:13 PM

## 2014-04-29 ENCOUNTER — Ambulatory Visit (HOSPITAL_COMMUNITY)
Admission: RE | Admit: 2014-04-29 | Discharge: 2014-04-29 | Disposition: A | Discharge status: Home / Self Care | Payer: 59 | Source: Ambulatory Visit | Attending: Family Medicine | Admitting: Family Medicine

## 2014-04-29 DIAGNOSIS — Z1231 Encounter for screening mammogram for malignant neoplasm of breast: Secondary | ICD-10-CM | POA: Diagnosis not present

## 2014-04-29 NOTE — Progress Notes (Signed)
  Medical screening examination/treatment/procedure(s) were performed by non-physician practitioner and as supervising physician I was immediately available for consultation/collaboration.     

## 2014-05-05 ENCOUNTER — Other Ambulatory Visit: Payer: Self-pay | Admitting: *Deleted

## 2014-05-05 MED ORDER — HYDROCHLOROTHIAZIDE 25 MG PO TABS: 25.0000 mg | ORAL_TABLET | Freq: Every morning | ORAL | Status: DC

## 2014-05-05 MED ORDER — FAMOTIDINE 20 MG PO TABS: 20.0000 mg | ORAL_TABLET | Freq: Two times a day (BID) | ORAL | Status: DC

## 2014-05-05 NOTE — Telephone Encounter (Signed)
Rx(s) sent to pharmacy electronically.  

## 2014-06-02 LAB — HM DIABETES EYE EXAM

## 2014-06-16 ENCOUNTER — Encounter: Payer: Self-pay | Admitting: Family Medicine

## 2014-07-05 ENCOUNTER — Other Ambulatory Visit: Payer: Self-pay | Admitting: Family Medicine

## 2014-07-25 ENCOUNTER — Other Ambulatory Visit: Payer: Self-pay | Admitting: Internal Medicine

## 2014-07-29 ENCOUNTER — Ambulatory Visit (INDEPENDENT_AMBULATORY_CARE_PROVIDER_SITE_OTHER): Payer: 59 | Admitting: Neurology

## 2014-07-29 ENCOUNTER — Encounter: Payer: Self-pay | Admitting: Neurology

## 2014-07-29 VITALS — BP 130/58 | HR 80 | Resp 20 | Ht 62.6 in | Wt 223.0 lb

## 2014-07-29 DIAGNOSIS — E119 Type 2 diabetes mellitus without complications: Secondary | ICD-10-CM | POA: Insufficient documentation

## 2014-07-29 DIAGNOSIS — Z9989 Dependence on other enabling machines and devices: Principal | ICD-10-CM

## 2014-07-29 DIAGNOSIS — E118 Type 2 diabetes mellitus with unspecified complications: Secondary | ICD-10-CM | POA: Diagnosis not present

## 2014-07-29 DIAGNOSIS — G4733 Obstructive sleep apnea (adult) (pediatric): Secondary | ICD-10-CM | POA: Diagnosis not present

## 2014-07-29 DIAGNOSIS — D509 Iron deficiency anemia, unspecified: Secondary | ICD-10-CM | POA: Insufficient documentation

## 2014-07-29 DIAGNOSIS — E66813 Obesity, class 3: Secondary | ICD-10-CM

## 2014-07-29 NOTE — Progress Notes (Signed)
SLEEP MEDICINE CLINIC   Provider:  Melvyn Novas, M D  Referring Provider: Pearline Cables, MD Primary Care Physician:  Abbe Amsterdam, MD  Chief Complaint  Patient presents with  . Follow-up    cpap, rm 10, alone    HPI:  [name] is a 64 y.o. female seen here as a referral from Dr. Patsy Lager for a sleep evaluation,  She is is seen by Dr. Marjory Lies for memory decline.  [name] was referred for a sleep study upon suggestion of Dr. Warner Mccreedy. The patient has the diagnosis of obesity, coronary artery disease, asthma, anemia, diabetes mellitus, hypertension, GERD she also had multiple family members in the last 16 months that have been either very sick or even passed away. She states that she felt under a lot of stress certainly was grieving and she injured unfortunately her foot in a fall she had stumbled into a hole as she puts it and [place] and suffered as she puts it 3 ruptured disks in her back. Dr. Jillyn Hidden, her orthopedic surgeon did see lumbar spinal surgery. The patient states that she was on pain pills and understandably her cognitive dysfunction worsened.  Now that she is off pain pills and that her family life has somewhat stabilized,  she feels that she is cognitively at a desired baseline. Much improved.   She had endorsed the Epworth Sleepiness Scale at 10 out of 24 points at the sleep lab . she had undergone a polysomnography at [facility] sleep  On 11-20 2-15 ,which revealed an apnea-hypopnea index of 11.2. Her REM AHI was 44.7 but there was not much of a supine or positional component. The lowest desaturation however was 82% was 54.5 minutes of desaturations in total. Based on the REM predominance and the hypoxemia finding CPAP titration was recommended. She was titrated on 03-28-14 to CPAP at 11 cm of water which she had slept during the titration study 140 minutes , with  23.5 minutes in REM sleep.  A she reports that she felt comfortable  at the sleep lab and secure safe. She has had a longer standing complained of loud snoring, insomnia, restless legs frequent awakenings and nocturia. She states that her nocturia has responded to CPAP also she had always attributed it to her diabetes. The snoring is resolved on CPAP. The patient said that most nights she sleeps in a separate bedroom from her husband because of his snoring. Her snoring seems to longer to be any issue. I reviewed today the patient's compliance record she has used the machine for the last 30 days only 66.7% of the time and only 56.7% over 4 hours at night. Her average AHI however was very good at 2.5. Her average pressure on CPAP is 8 cm water and her average user time is 4 hours and 25 minutes. She explained the noncompliance with  family problems she had to attend to. She is a caregiver of several older relatives.  The patient usually goes to bed around 9:30 but since she has had more energy with the CPAP use her bedtime has a little bit delay has been delayed. She now goes to bed around 11 PM. She sleeps through the night fairly uninterrupted, she sleeps supine and on the side as well. She describes her bedroom usually as core, quiet and dark but her husband snoring has become a problem for her. At 6:30, she drinks decaffeinated coffee in the morning she started drinking tea ,  without sugar but sweetened with honey.  Never been a smoker, she doesn't use any tobacco products, she occasionally drinks  "a little wine cooler".    NO history of TBI, no neck surgery.     Review of Systems: Out of a complete 14 system review, the patient complains of only the following symptoms, and all other reviewed systems are negative. On CPAP , her Epworth sleepiness score is endorsed at 12 points, her fatigue severity score at 31 points, and the depression scale at 1 point.  She has noticed decreased snoring, decreased hypersomnia, decreased nocturia, she does not wake up with the  same dry mouth as before.  History   Social History  . Marital Status: Married    Spouse Name: [name]  . Number of Children: 2  . Years of Education: MSN   Occupational History  . [employer]   Social History Main Topics  . Smoking status: Never Smoker   . Smokeless tobacco: Never Used  . Alcohol Use: Yes     Comment: occasional, 1 a month wine  . Drug Use: No  . Sexual Activity: Yes   Other Topics Concern  . Not on file   Social History Narrative      Married. Education: [employer]. Exercise: Yes   Patient lives at home with her spouse.   Caffeine use: 1 drink of tea a day    Family History  Problem Relation Age of Onset  . Coronary artery disease Mother   . Rheum arthritis Mother   . Dementia Mother   . Heart attack Father   . Hypertension Father   . Hyperlipidemia Father   . Other Father     MVA  . Hypertension Brother   . Cancer Brother   . Heart disease Brother   . Cancer Paternal Grandmother     stomach  . Diabetes Paternal Grandfather     Past Medical History  Diagnosis Date  . Anemia   . Diabetes mellitus without complication   . Hypertension   . Hyperlipidemia LDL goal < 70   . CAD (coronary artery disease)     PCI to LAD in 2010, mild residual disease in LCX and RCA  . GERD (gastroesophageal reflux disease)   . Plantar fascia rupture     Left Foot  . Lumbar back pain     Past Surgical History  Procedure Laterality Date  . Tympanoplasty Bilateral   . Carotid stent  2009  . Doppler echocardiography  08/01/2009    EF=>55%,LV normal  . Nm myocar perf wall motion  09/22/2008    lexiscan-EF 83%; glogal LV systolic fx is norm. ,evidence of mild ischemia basal anterior,midanterior and apical lateral region(s).   . Lower arterial duplex  06/20/10    abi's normal,rgt 0.98,lft 1.06;bilateral PVRs normal  . Abdominal hysterectomy    . Coronary angioplasty with stent placement  2010and 10-02-2012    Stent DES, Xience to prox. LAD  .  Lumbar laminectomy/decompression microdiscectomy Left 01/06/2013    Procedure: MICRO LUMBAR DECOMPRESSION L4-5 AND L5-S1;  Surgeon: Javier DockerJeffrey C Beane, MD;  Location: [facility] ORS;  Service: Orthopedics;  Laterality: Left;  . Left heart catheterization with coronary angiogram N/A 10/02/2012    Procedure: LEFT HEART CATHETERIZATION WITH CORONARY ANGIOGRAM;  Surgeon: Runell GessJonathan J Berry, MD;  [facility];  Service: Cardiovascular;  Laterality: N/A;    Current Outpatient Prescriptions  Medication Sig Dispense Refill  . aspirin EC 81 MG tablet Take 1 tablet (81  mg total) by mouth daily. Resume 5 days post-op    . cetirizine (ZYRTEC) 10 MG tablet Take 1 tablet (10 mg total) by mouth daily. 30 tablet 3  . cholecalciferol (VITAMIN D) 1000 UNITS tablet Take 1,000 Units by mouth daily.    . clopidogrel (PLAVIX) 75 MG tablet Take 1 tablet (75 mg total) by mouth once. Please make appointment for future refills. 30 tablet 0  . cyclobenzaprine (FLEXERIL) 10 MG tablet Take 1 tablet (10 mg total) by mouth 3 (three) times daily as needed for muscle spasms. 30 tablet 1  . diazepam (VALIUM) 2 MG tablet Take 1 tablet (2 mg total) by mouth every 8 (eight) hours as needed. For muscle spasm 30 tablet 0  . famotidine (PEPCID) 20 MG tablet Take 1 tablet (20 mg total) by mouth 2 (two) times daily. <PLEASE MAKE APPOINTMENT FOR REFILLS> 180 tablet 0  . fluticasone (FLONASE) 50 MCG/ACT nasal spray Place 2 sprays into both nostrils daily. Take two sprays in each nostril daily. 16 g 12  . hydrochlorothiazide (HYDRODIURIL) 25 MG tablet Take 1 tablet (25 mg total) by mouth every morning. <PLEASE MAKE APPOINTMENT FOR REFILLS> 90 tablet 0  . ipratropium (ATROVENT) 0.03 % nasal spray Place 2 sprays into both nostrils 2 (two) times daily. 30 mL 0  . metFORMIN (GLUCOPHAGE XR) 500 MG 24 hr tablet Take 1 tablet (500 mg total) by mouth daily with breakfast. 180 tablet 0  . potassium chloride SA (K-DUR,KLOR-CON) 20 MEQ tablet Take 1 tablet  (20 mEq total) by mouth daily. 90 tablet 3  . simvastatin (ZOCOR) 40 MG tablet Take 1 tablet (40 mg total) by mouth every evening. 90 tablet 3  . telmisartan (MICARDIS) 40 MG tablet Take 1 tablet (40 mg total) by mouth every morning. 90 tablet 3  . traMADol (ULTRAM) 50 MG tablet TAKE 1 TABLET BY MOUTH EVERY 6 HOURS AS NEEDED 30 tablet 0  . TRUETEST TEST test strip TEST ONCE DAILY AS DIRECTED 100 each 3  . zolpidem (AMBIEN) 10 MG tablet Take 0.5 tablets (5 mg total) by mouth at bedtime as needed for sleep. 30 tablet 0  . ferrous sulfate 325 (65 FE) MG tablet Take 325 mg by mouth daily with breakfast.    . loratadine (CLARITIN) 10 MG tablet Take 10 mg by mouth daily.     No current facility-administered medications for this visit.    Allergies as of 07/29/2014 - Review Complete 07/29/2014  Allergen Reaction Noted  . Niacin and related Other (See Comments) 12/29/2012  . Tolectin [tolmetin]  12/20/2013    Vitals: BP 130/58 mmHg  Pulse 80  Resp 20  Ht 5' 2.6" (1.59 m)  Wt 223 lb (101.152 kg)  BMI 40.01 kg/m2 Last Weight:  Wt Readings from Last 1 Encounters:  07/29/14 223 lb (101.152 kg)       Last Height:   Ht Readings from Last 1 Encounters:  07/29/14 5' 2.6" (1.59 m)    Physical exam:  General: The patient is awake, alert and appears not in acute distress. The patient is well groomed. Head: Normocephalic, atraumatic. Neck is supple. Mallampati 3 ,   neck circumference:14.4 . Nasal airflow unrestricted  TMJ is not  evident . Retrognathia is seen.  Cardiovascular:  Regular rate and rhythm, without  murmurs or carotid bruit, and without distended neck veins. Respiratory: Lungs are clear to auscultation. Skin:  Without evidence of edema, or rash Trunk: BMI is 40, elevated and patient  has normal posture.  Neurologic exam : The patient is awake and alert, oriented to place and time.   Memory subjective  described as impaired, but improved . There is a normal attention span &  concentration ability.  Speech is fluent without  dysarthria, dysphonia or aphasia. Mood and affect are appropriate.  Cranial nerves: Pupils are equal and briskly reactive to light. Funduscopic exam without  evidence of pallor or edema.  Extraocular movements  in vertical and horizontal planes intact and without nystagmus. Visual fields by finger perimetry are intact. Hearing to finger rub intact.  Facial sensation intact to fine touch. Facial motor strength is symmetric and tongue and uvula move midline.  Motor exam:   Normal tone ,muscle bulk and symmetric ,strength in all extremities. 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 Proprioception is tested in the upper extremities only. This was normal.  Coordination: Rapid alternating movements in the fingers/hands is normal.  Finger-to-nose maneuver  normal without evidence of ataxia, dysmetria or tremor.  Gait and station: Patient walks without assistive device and is able unassisted to climb up to the exam table. Strength within normal limits.  Stance is stable and normal. Tandem gait is unfragmented. Romberg testing is  negative.  Deep tendon reflexes: in the  upper and lower extremities are symmetric and intact. Babinski downgoing.   Assessment:  After physical and neurologic examination, review of neurophysiology testing and pre-existing records, assessment is   1) [name] has obstructive sleep apnea to a mild degree, but her apnea was REM sleep accentuated and associated with low and persistently low oxygen levels. She was titrated to CPAP and has noted an improvement in her previously present symptoms of nocturia #1 hypersomnia #2 snoring #3 she also now is more restorative refreshed in the morning after less hours of sleep. I discussed with her that I would like her to take the CPAP more regular right now her compliance is  below average. But she still fulfill fulfills the 4 hour nightly rule on average she had 10 out of 30 days that she did not use the machine and that has to improve. She explains the circumstances that led to her noncompliance and I hopefully will get her back to a 90+ compliance own.   The patient was advised of the nature of the diagnosed sleep disorder ( OSA ) , the treatment options and risks for general a health and wellness arising from not treating the condition. Visit duration was 30 minutes.   Plan:  Treatment plan and additional workup :  I like for [name] to consider a medical weight loss program. She could also join something like [facility] weight watchers to make sure that a low carb diet is implemented which also will help her diabetes. Her body mass index is her main risk factor. She may find in the near future that her hypertension and hopefully also her diabetes better controlled when she sleeps for CPAP. Would like to see another revisit in 12 month. Memory follow up with Dr Marjory Lies .      Porfirio Mylar Quintasia Theroux MD  07/29/2014

## 2014-07-29 NOTE — Patient Instructions (Signed)

## 2014-08-08 ENCOUNTER — Other Ambulatory Visit: Payer: Self-pay | Admitting: Internal Medicine

## 2014-08-17 ENCOUNTER — Other Ambulatory Visit: Payer: Self-pay | Admitting: Internal Medicine

## 2014-08-17 ENCOUNTER — Other Ambulatory Visit: Payer: Self-pay | Admitting: Family Medicine

## 2014-08-17 NOTE — Telephone Encounter (Signed)
Rx(s) sent to pharmacy electronically. OV 09/21/14

## 2014-08-18 ENCOUNTER — Encounter: Payer: Self-pay | Admitting: Family Medicine

## 2014-08-19 ENCOUNTER — Other Ambulatory Visit: Payer: Self-pay | Admitting: Family Medicine

## 2014-09-01 ENCOUNTER — Other Ambulatory Visit: Payer: Self-pay | Admitting: Family Medicine

## 2014-09-02 ENCOUNTER — Ambulatory Visit: Payer: 59 | Admitting: Diagnostic Neuroimaging

## 2014-09-05 DIAGNOSIS — Z0271 Encounter for disability determination: Secondary | ICD-10-CM

## 2014-09-07 ENCOUNTER — Ambulatory Visit (INDEPENDENT_AMBULATORY_CARE_PROVIDER_SITE_OTHER): Payer: 59 | Admitting: *Deleted

## 2014-09-07 VITALS — BP 150/60 | HR 87 | Resp 20

## 2014-09-07 DIAGNOSIS — R002 Palpitations: Secondary | ICD-10-CM

## 2014-09-07 NOTE — Progress Notes (Signed)
Patient walked into the office today requesting to be seen by the nurse. Patient is c/o for the last 2 days her heart beating irregular. She can feel it in her throat and it will make her SOB. It happened twice yesterday and today while at dr bean's office. She is also c/o bad headache and indigestion from eating pizza last night. EKG complete and discussed with dr hochrein (DOD). Reassurance given to the patient. She has a follow up appt 09-21-14 with dr Rennis Golden. She will keep that appt.

## 2014-09-09 ENCOUNTER — Encounter: Payer: Self-pay | Admitting: Diagnostic Neuroimaging

## 2014-09-09 ENCOUNTER — Telehealth: Payer: Self-pay | Admitting: Neurology

## 2014-09-09 ENCOUNTER — Ambulatory Visit (INDEPENDENT_AMBULATORY_CARE_PROVIDER_SITE_OTHER): Payer: 59 | Admitting: Diagnostic Neuroimaging

## 2014-09-09 VITALS — BP 119/59 | HR 71 | Ht 62.5 in | Wt 224.0 lb

## 2014-09-09 DIAGNOSIS — R413 Other amnesia: Secondary | ICD-10-CM | POA: Diagnosis not present

## 2014-09-09 NOTE — Telephone Encounter (Signed)
Pt came by office stating that she recently tried to reach out to [facility] to receive cpap supplies and the company stated that her insurance would not cover the supplies. Pt states she has been on cpap since January 2016 and would like to know if there is an alternative company she can use to meet her needs. Please call the pt when request is reviewed.

## 2014-09-09 NOTE — Progress Notes (Signed)
[facility]  PATIENT: [name] DOB: 08-Oct-1950  REFERRING CLINICIAN: Copeland HISTORY FROM: patient  REASON FOR VISIT: follow up   HISTORICAL  CHIEF COMPLAINT:  Chief Complaint  Patient presents with  . Memory Loss    rm 7, MOCA  25    HISTORY OF PRESENT ILLNESS:   UPDATE 09/09/14: Since last visit, doing better with memory and sleep apnea treatments. Having more issues with left foot/toe drop and back pain --> has MRI lumbar spine setup via ortho for reevaluation of known lumbar radiculopathy.   UPDATE 03/01/14: Since last visit, feeling better. Still with some stress, memory loss, but getting better. Had sleep study and dx'd with sleep apnea, planning to have titration study in Jan 2016 for CPAP.  PRIOR HPI (12/20/13): 64 year old right-handed female here for a list of memory loss. Patient noticed memory problems since February 23, 2013. Husband noticed problems since February 2015. Patient having short-term memory problems, poor concentration attention, in the setting of significantly increased psychosocial stressors. She has had more trouble driving and cooking recently.  Patient was taking care of her brother for the past one year who was diagnosed with pancreatic cancer. He deteriorated and ultimately passed away in 02/23/2014. Patient placed some blame on herself for his death, related to recommending morphine drip. During his funeral, patient stepped into a hole in the ground and developed left foot, ankle injury and plantar fascia rupture. Related to this patient was unable to work. She had to have premature retirement. This contributed to increasing stress, depression and anxiety. Patient also had a fall at the beats with head trauma, low back pain and disc bulging. Patient had lumbar spine surgery, L4, L5, S1 microdecompression 2014. Patient has family history of dementia in her mother and father.    REVIEW OF SYSTEMS: Full 14 system review of systems  performed and notable only for memory loss muscle cramps restless legs back pain tremors.   ALLERGIES: Allergies  Allergen Reactions  . Niacin And Related Other (See Comments)    Whelps and skin flushed, mouth tingling  . Tolectin [Tolmetin]     HOME MEDICATIONS: Outpatient Prescriptions Prior to Visit  Medication Sig Dispense Refill  . aspirin EC 81 MG tablet Take 1 tablet (81 mg total) by mouth daily. Resume 5 days post-op    . cetirizine (ZYRTEC) 10 MG tablet Take 1 tablet (10 mg total) by mouth daily. 30 tablet 3  . cholecalciferol (VITAMIN D) 1000 UNITS tablet Take 1,000 Units by mouth daily.    . clopidogrel (PLAVIX) 75 MG tablet Take 1 tablet (75 mg total) by mouth daily. 30 tablet 1  . cyclobenzaprine (FLEXERIL) 10 MG tablet TAKE 1 TABLET BY MOUTH 3 TIMES DAILY AS NEEDED FOR MUSCLE SPASMS. "OV NEEDED FOR ADDITIONAL REFILLS" 30 tablet 0  . diazepam (VALIUM) 2 MG tablet Take 1 tablet (2 mg total) by mouth every 8 (eight) hours as needed. For muscle spasm 30 tablet 0  . famotidine (PEPCID) 20 MG tablet TAKE 1 TABLET BY MOUTH 2 TIMES DAILY. 180 tablet 0  . ferrous sulfate 325 (65 FE) MG tablet Take 325 mg by mouth daily with breakfast.    . fluticasone (FLONASE) 50 MCG/ACT nasal spray Place 2 sprays into both nostrils daily. Take two sprays in each nostril daily. 16 g 12  . hydrochlorothiazide (HYDRODIURIL) 25 MG tablet TAKE 1 TABLET BY MOUTH ONCE DAILY IN THE MORNING 90 tablet 0  . ipratropium (ATROVENT) 0.03 % nasal spray Place 2  sprays into both nostrils 2 (two) times daily. 30 mL 0  . loratadine (CLARITIN) 10 MG tablet Take 10 mg by mouth daily.    . metFORMIN (GLUCOPHAGE-XR) 500 MG 24 hr tablet TAKE 1 TABLET (500 MG TOTAL) BY MOUTH DAILY WITH BREAKFAST.  "OV NEEDED FOR ADDITIONAL REFILLS" 180 tablet 0  . potassium chloride SA (K-DUR,KLOR-CON) 20 MEQ tablet Take 1 tablet (20 mEq total) by mouth daily. 90 tablet 3  . simvastatin (ZOCOR) 40 MG tablet Take 1 tablet (40 mg total) by  mouth every evening. 90 tablet 3  . telmisartan (MICARDIS) 40 MG tablet Take 1 tablet (40 mg total) by mouth every morning. 90 tablet 3  . traMADol (ULTRAM) 50 MG tablet TAKE 1 TABLET BY MOUTH EVERY 6 HOURS AS NEEDED 30 tablet 0  . TRUETEST TEST test strip TEST ONCE DAILY AS DIRECTED 100 each 3  . zolpidem (AMBIEN) 10 MG tablet Take 0.5 tablets (5 mg total) by mouth at bedtime as needed for sleep. 30 tablet 0   No facility-administered medications prior to visit.    PAST MEDICAL HISTORY: Past Medical History  Diagnosis Date  . Anemia   . Diabetes mellitus without complication   . Hypertension   . Hyperlipidemia LDL goal < 70   . CAD (coronary artery disease)     PCI to LAD in 2010, mild residual disease in LCX and RCA  . GERD (gastroesophageal reflux disease)   . Plantar fascia rupture     Left Foot  . Lumbar back pain     PAST SURGICAL HISTORY: Past Surgical History  Procedure Laterality Date  . Tympanoplasty Bilateral   . Carotid stent  2009  . Doppler echocardiography  08/01/2009    EF=>55%,LV normal  . Nm myocar perf wall motion  09/22/2008    lexiscan-EF 83%; glogal LV systolic fx is norm. ,evidence of mild ischemia basal anterior,midanterior and apical lateral region(s).   . Lower arterial duplex  06/20/10    abi's normal,rgt 0.98,lft 1.06;bilateral PVRs normal  . Abdominal hysterectomy    . Coronary angioplasty with stent placement  2010and 10-02-2012    Stent DES, Xience to prox. LAD  . Lumbar laminectomy/decompression microdiscectomy Left 01/06/2013    Procedure: MICRO LUMBAR DECOMPRESSION L4-5 AND L5-S1;  Surgeon: Javier Docker, MD;  Location: [facility];  Service: Orthopedics;  Laterality: Left;  . Left heart catheterization with coronary angiogram N/A 10/02/2012    Procedure: LEFT HEART CATHETERIZATION WITH CORONARY ANGIOGRAM;  Surgeon: Runell Gess, MD;  Location: [facility] CATH LAB;  Service: Cardiovascular;  Laterality: N/A;    FAMILY HISTORY: Family History    Problem Relation Age of Onset  . Coronary artery disease Mother   . Rheum arthritis Mother   . Dementia Mother   . Heart attack Father   . Hypertension Father   . Hyperlipidemia Father   . Other Father     MVA  . Hypertension Brother   . Cancer Brother   . Heart disease Brother   . Cancer Paternal Grandmother     stomach  . Diabetes Paternal Grandfather     SOCIAL HISTORY:  History   Social History  . Marital Status: Married    Spouse Name: [name]  . Number of Children: 2  . Years of Education: MSN   Occupational History  . [employer]   Social History Main Topics  . Smoking status: Never Smoker   . Smokeless tobacco: Never Used  . Alcohol Use: Yes  Comment: occasional, 1 a month wine  . Drug Use: No  . Sexual Activity: Yes   Other Topics Concern  . Not on file   Social History Narrative      Married. Education: [employer]. Exercise: Yes   Patient lives at home with her spouse.   Caffeine use: 1 drink of tea a day     PHYSICAL EXAM  Filed Vitals:   09/09/14 0813  BP: 119/59  Pulse: 71  Height: 5' 2.5" (1.588 m)  Weight: 224 lb (101.606 kg)    Not recorded      Body mass index is 40.29 kg/(m^2).   MMSE - Mini Mental State Exam 12/20/2013  Orientation to time 5  Orientation to Place 5  Registration 3  Attention/ Calculation 5  Recall 3  Language- name 2 objects 2  Language- repeat 1  Language- follow 3 step command 3  Language- read & follow direction 1  Write a sentence 1  Copy design 1  Total score 30    Montreal Cognitive Assessment  09/09/2014 03/01/2014 12/20/2013  Visuospatial/ Executive (0/5) Naming (0/3) Attention: Read list of digits (0/2) Attention: Read list of letters (0/1) Attention: Serial 7 subtraction starting at 100 (0/3) Language: Repeat phrase (0/2) Language : Fluency (0/1) 1 1 0  Abstraction (0/2) Delayed Recall (0/5) Orientation (0/6) Total Adjusted Score (based on education) GENERAL EXAM: Patient is in no distress; well developed, nourished and groomed; neck is supple  CARDIOVASCULAR: Regular rate and rhythm, no murmurs, no carotid bruits  NEUROLOGIC: MENTAL STATUS: awake, alert, language fluent, comprehension intact, naming intact, fund of knowledge appropriate; NO FRONTAL RELEASE SIGNS.   CRANIAL NERVE: no papilledema on fundoscopic exam, pupils equal and reactive to light, visual fields full to confrontation, extraocular muscles intact, no nystagmus, facial sensation and strength symmetric, hearing intact, palate elevates symmetrically, uvula midline, shoulder shrug symmetric, tongue midline. MOTOR: normal bulk and tone, full strength in the BUE, BLE; MILD WEAKNESS IN LEFT FOOT AND TOE DORSIFLEXION. SENSORY: normal and symmetric to light touch, pinprick, temperature, vibration and proprioception COORDINATION: finger-nose-finger, fine finger movements normal REFLEXES: deep tendon reflexes present and symmetric GAIT/STATION: narrow based gait; LIMPS ON LEFT FOOT; DIFF WITH HEEL GAIT ON LEFT FOOT    DIAGNOSTIC DATA (LABS, IMAGING, TESTING) - I reviewed patient records, labs, notes, testing and imaging myself where available.  Lab Results  Component Value Date   WBC 12.0* 04/28/2014   HGB 11.8* 04/28/2014   HCT 37.1* 04/28/2014   MCV 80.0 04/28/2014   PLT 336 09/27/2013      Component Value Date/Time   NA 141 09/27/2013 0846   K 4.3 09/27/2013 0846   CL 103 09/27/2013 0846   CO2 23 09/27/2013 0846   GLUCOSE 119* 09/27/2013 0846   BUN 17 09/27/2013 0846   CREATININE 0.73 09/27/2013 0846   CREATININE 0.70 01/07/2013 0550   CALCIUM 9.9 09/27/2013 0846   PROT 6.8 09/27/2013 0846   ALBUMIN 4.3 09/27/2013 0846   AST 13 09/27/2013 0846   ALT 15 09/27/2013 0846   ALKPHOS 138* 09/27/2013 0846   BILITOT 0.5 09/27/2013 0846   GFRNONAA >90 01/07/2013 0550   GFRAA >90 01/07/2013 0550    Lab Results  Component Value Date  CHOL 164 09/27/2013   HDL 31* 09/27/2013   LDLCALC 66 09/27/2013   TRIG 333* 09/27/2013   CHOLHDL 5.3 09/27/2013   Lab Results  Component Value Date   HGBA1C 6.3 12/01/2013   Lab Results  Component Value Date   VITAMINB12 924 12/20/2013   Lab Results  Component Value Date   TSH 1.482 09/27/2013    I reviewed images myself and agree with interpretation. -VRP  12/07/12 CT head - Negative head CT.  12/07/12 CT cervical spine - No acute bony injury in the cervical spine.  12/29/13 MRI brain 1. Few subcortical and juxtacortical foci of non-specific gliosis, likely chronic small vessel ischemic disease.  2. No acute findings.   ASSESSMENT AND PLAN  64 y.o. year old female here with short term memory loss, poor attention/focus, since Nov 2014, in setting of increased personal stress (brother's illness and death, left foot injury, inability to work and premature retirement). Doing slightly better than at last visit. Now diagnosed with sleep apnea.  Dx: memory loss due to stress, anxiety/depression and sleep apnea  Patient Active Problem List   Diagnosis Date Noted  . Obesity, Class III, BMI 40-49.9 (morbid obesity) 07/29/2014  . OSA on CPAP 07/29/2014  . Iron deficiency anemia 07/29/2014  . DM (diabetes mellitus) with complications 07/29/2014  . Environmental and seasonal allergies 04/28/2014  . OSA (obstructive sleep apnea) 02/22/2014  . Spinal stenosis, lumbar region, with neurogenic claudication 01/06/2013  . Obesity, morbid, BMI 40.0-49.9 10/20/2012  . Chest pain 10/02/2012  . CAD (coronary artery disease)PCI to LAD in 2010, mild residual disease in LCX and RCA   . Hyperlipidemia LDL goal < 70   . Diabetes mellitus, type 2 04/16/2012  . HTN (hypertension) 04/16/2012    PLAN: I spent 15 minutes of face to face time with patient. Greater than 50% of time was spent in counseling and coordination of care with patient. In summary  we discussed: - Eat more nuts, berries, plants, vegetables. Eat less processed food, fried food, red meat, saltyfood and sugary foods. Focus on mental, social and physical stimulation. Try to get at least 7 hours sleep per day.  Return in about 6 months (around 03/11/2015).     Suanne Marker, MD 09/09/2014, 8:54 AM Certified in Neurology, Neurophysiology and Neuroimaging  [facility] [address] [place], [place] [address] [phone]

## 2014-09-12 NOTE — Telephone Encounter (Signed)
Spoke to [facility], they are able to provide supplies for the pt. Tried to call pt and inform her, but no answer, left a message asking her to call me back.

## 2014-09-13 ENCOUNTER — Other Ambulatory Visit: Payer: Self-pay

## 2014-09-13 DIAGNOSIS — G4733 Obstructive sleep apnea (adult) (pediatric): Secondary | ICD-10-CM

## 2014-09-13 NOTE — Telephone Encounter (Signed)
Spoke to pt and informed her that [facility] is able to provide supplies for the pt. She asked me to send her information over to them. I will send it to [facility].

## 2014-09-19 ENCOUNTER — Other Ambulatory Visit: Payer: Self-pay

## 2014-09-21 ENCOUNTER — Ambulatory Visit: Payer: 59 | Admitting: Internal Medicine

## 2014-09-21 ENCOUNTER — Ambulatory Visit (INDEPENDENT_AMBULATORY_CARE_PROVIDER_SITE_OTHER): Payer: 59 | Admitting: Internal Medicine

## 2014-09-21 ENCOUNTER — Encounter: Payer: Self-pay | Admitting: Internal Medicine

## 2014-09-21 VITALS — BP 138/64 | HR 75 | Ht 61.0 in | Wt 225.0 lb

## 2014-09-21 DIAGNOSIS — R002 Palpitations: Secondary | ICD-10-CM

## 2014-09-21 DIAGNOSIS — I10 Essential (primary) hypertension: Secondary | ICD-10-CM

## 2014-09-21 DIAGNOSIS — M48062 Spinal stenosis, lumbar region with neurogenic claudication: Secondary | ICD-10-CM

## 2014-09-21 DIAGNOSIS — M4806 Spinal stenosis, lumbar region: Secondary | ICD-10-CM

## 2014-09-21 DIAGNOSIS — I2510 Atherosclerotic heart disease of native coronary artery without angina pectoris: Secondary | ICD-10-CM

## 2014-09-21 DIAGNOSIS — I2583 Coronary atherosclerosis due to lipid rich plaque: Secondary | ICD-10-CM

## 2014-09-21 DIAGNOSIS — E785 Hyperlipidemia, unspecified: Secondary | ICD-10-CM

## 2014-09-21 MED ORDER — METOPROLOL SUCCINATE ER 25 MG PO TB24: 12.5000 mg | ORAL_TABLET | Freq: Every day | ORAL | Status: DC

## 2014-09-21 NOTE — Patient Instructions (Signed)
Your physician has recommended you make the following change in your medication: START metoprolol succinate (toprol xl) 12.5mg  once daily  Your physician recommends that you schedule a follow-up appointment in about 3 months - after surgery   For back surgery - you can hold plavix for 5 days and aspirin 7 days prior - restart after

## 2014-09-21 NOTE — Progress Notes (Signed)
Date:  09/21/2014   ID:  [name], DOB 03/17/1951, MRN [id number]  PCP:  Abbe AmsterdamOPLAND,JESSICA, MD  Primary Cardiologist:  Deva Ron  CC: Low back pain   History of Present Illness: [name] PeonHelen J Froman is a 64 y.o. female with a history of CAD with DES stent to prox. LAD in 2010 with residual disease in LCX 10-20% and RCA 20% lesion.  She also has HTN, DM, dyslipidemia and obesity.  She presented on 10/01/12 to the ED with c/o chest pressure. Discomfort began at work and associated with flushing symptoms secondary to niacin. No DOE, SOB, nausea or diaphoresis. She then developed chest pressure and nausea and went to Urgent care. She thought the flushing was secondary to stopping ASA. She was given ASA, cooled down and then had chest presssure. Sent [facility] and NTG SL did resolve the pressure. She was very concerned about the pressure because it was the same that she had with her Lexiscan in 2010 that lead to her stent.  Coronary angiogram revealed widely patent proximal LAD stent with otherwise normal coronary arteries and normal LV function.  [name] recently underwent back surgery and is doing fairly well. She still is having some back pain but is adamant about not taking medication. She plans to go back to work on Monday next week. Unfortunately she is still grieving from her brother who passed away last week. He had stage IV pancreatic cancer and lived less than one year.  [name] returns today for follow-up. Overall she denies any chest pain or worsening shortness of breath. Recently she's been having some palpitations, however she attributes that to stress. She recently is been placed on CPAP and seems to be tolerating that well. She's had some problems with memory loss but neurologic workup is been unremarkable. She recently had worsening back problems and has been to see Dr. Shelle IronBeane. He is considering surgery on her back but would need her to come off of her antiplatelets medications.  Wt  Readings from Last 3 Encounters:  09/21/14 225 lb (102.059 kg)  09/09/14 224 lb (101.606 kg)  07/29/14 223 lb (101.152 kg)     Past Medical History  Diagnosis Date  . Anemia   . Diabetes mellitus without complication   . Hypertension   . Hyperlipidemia LDL goal < 70   . CAD (coronary artery disease)     PCI to LAD in 2010, mild residual disease in LCX and RCA  . GERD (gastroesophageal reflux disease)   . Plantar fascia rupture     Left Foot  . Lumbar back pain     Current Outpatient Prescriptions  Medication Sig Dispense Refill  . aspirin EC 81 MG tablet Take 1 tablet (81 mg total) by mouth daily. Resume 5 days post-op    . cetirizine (ZYRTEC) 10 MG tablet Take 1 tablet (10 mg total) by mouth daily. (Patient taking differently: Take 10 mg by mouth as needed. ) 30 tablet 3  . cholecalciferol (VITAMIN D) 1000 UNITS tablet Take 1,000 Units by mouth daily.    . clopidogrel (PLAVIX) 75 MG tablet Take 1 tablet (75 mg total) by mouth daily. 30 tablet 1  . cyclobenzaprine (FLEXERIL) 10 MG tablet TAKE 1 TABLET BY MOUTH 3 TIMES DAILY AS NEEDED FOR MUSCLE SPASMS. "OV NEEDED FOR ADDITIONAL REFILLS" 30 tablet 0  . diazepam (VALIUM) 2 MG tablet Take 1 tablet (2 mg total) by mouth every 8 (eight) hours as needed. For muscle spasm 30  tablet 0  . famotidine (PEPCID) 20 MG tablet TAKE 1 TABLET BY MOUTH 2 TIMES DAILY. 180 tablet 0  . fluticasone (FLONASE) 50 MCG/ACT nasal spray Place 2 sprays into both nostrils daily. Take two sprays in each nostril daily. (Patient taking differently: Place 2 sprays into both nostrils as needed. Take two sprays in each nostril daily.) 16 g 12  . hydrochlorothiazide (HYDRODIURIL) 25 MG tablet TAKE 1 TABLET BY MOUTH ONCE DAILY IN THE MORNING 90 tablet 0  . metFORMIN (GLUCOPHAGE-XR) 500 MG 24 hr tablet TAKE 1 TABLET (500 MG TOTAL) BY MOUTH DAILY WITH BREAKFAST.  "OV NEEDED FOR ADDITIONAL REFILLS" 180 tablet 0  . potassium chloride SA (K-DUR,KLOR-CON) 20 MEQ tablet Take 1  tablet (20 mEq total) by mouth daily. 90 tablet 3  . simvastatin (ZOCOR) 40 MG tablet Take 1 tablet (40 mg total) by mouth every evening. 90 tablet 3  . telmisartan (MICARDIS) 40 MG tablet Take 1 tablet (40 mg total) by mouth every morning. 90 tablet 3  . traMADol (ULTRAM) 50 MG tablet TAKE 1 TABLET BY MOUTH EVERY 6 HOURS AS NEEDED 30 tablet 0  . TRUETEST TEST test strip TEST ONCE DAILY AS DIRECTED 100 each 3  . metoprolol succinate (TOPROL-XL) 25 MG 24 hr tablet Take 0.5 tablets (12.5 mg total) by mouth daily. 15 tablet 6   No current facility-administered medications for this visit.    Allergies:    Allergies  Allergen Reactions  . Niacin And Related Other (See Comments)    Whelps and skin flushed, mouth tingling  . Tolectin [Tolmetin]     Social History:  The patient  reports that she has never smoked. She has never used smokeless tobacco. She reports that she drinks alcohol. She reports that she does not use illicit drugs.   Family history:   Family History  Problem Relation Age of Onset  . Coronary artery disease Mother   . Rheum arthritis Mother   . Dementia Mother   . Heart attack Father   . Hypertension Father   . Hyperlipidemia Father   . Other Father     MVA  . Hypertension Brother   . Cancer Brother   . Heart disease Brother   . Cancer Paternal Grandmother     stomach  . Diabetes Paternal Grandfather     ROS:  Please see the history of present illness.  All other systems reviewed and negative.   PHYSICAL EXAM: VS:  BP 138/64 mmHg  Pulse 75  Ht 5\' 1"  (1.549 m)  Wt 225 lb (102.059 kg)  BMI 42.54 kg/m2 Well nourished, well developed, in no acute distress HEENT: Pupils are equal round react to light accommodation extraocular movements are intact.  Cardiac: Regular rate and rhythm without murmurs rubs or gallops. Lungs:  clear to auscultation bilaterally, no wheezing, rhonchi or rales Ext: 1+ lower extremity edema.  2+ radial and dorsalis pedis pulses. Right  wrist cath site healed nicely Skin: warm and dry Neuro:  Grossly normal Psych: Mood, affect normal  ASSESSMENT AND PLAN:  Problem List Items Addressed This Visit    HTN (hypertension) - Primary   Relevant Medications   metoprolol succinate (TOPROL-XL) 25 MG 24 hr tablet   Other Relevant Orders   EKG 12-Lead   CAD (coronary artery disease)PCI to LAD in 2010, mild residual disease in LCX and RCA   Relevant Medications   metoprolol succinate (TOPROL-XL) 25 MG 24 hr tablet   Hyperlipidemia with target LDL less than 70  Relevant Medications   metoprolol succinate (TOPROL-XL) 25 MG 24 hr tablet   Spinal stenosis, lumbar region, with neurogenic claudication    Other Visit Diagnoses    Heart palpitations        Relevant Medications    metoprolol succinate (TOPROL-XL) 25 MG 24 hr tablet    Other Relevant Orders    EKG 12-Lead      EKG: Normal sinus rhythm at 75  PLAN:  1.  [name] is doing well without any chest pain. She recently saw having more problems with her back and may need additional back surgery. From a cardiac standpoint, I have no problem with her coming off of her Plavix for 5 days prior to surgery and aspirin for 7 days. She would be at low to intermediate risk for surgery. She's not having any active anginal symptoms at this time and I don't feel stress testing is necessary. She is not currently on beta blocker and as she is having some palpitations would benefit from that as well as the indication for patients with coronary disease. I like to start on Toprol-XL 12.5 mg daily. This is also been shown to reduce perioperative cardiovascular mortality.  Plan to see her back in about 3 months after surgery.  Chrystie Nose, MD, [facility] Attending Cardiologist [facility]

## 2014-09-27 ENCOUNTER — Ambulatory Visit: Payer: PPO | Attending: Specialist | Admitting: Rehabilitation

## 2014-09-27 DIAGNOSIS — R262 Difficulty in walking, not elsewhere classified: Secondary | ICD-10-CM | POA: Insufficient documentation

## 2014-09-27 DIAGNOSIS — M5442 Lumbago with sciatica, left side: Secondary | ICD-10-CM

## 2014-09-27 DIAGNOSIS — R29898 Other symptoms and signs involving the musculoskeletal system: Secondary | ICD-10-CM | POA: Diagnosis not present

## 2014-09-27 DIAGNOSIS — R269 Unspecified abnormalities of gait and mobility: Secondary | ICD-10-CM

## 2014-09-27 NOTE — Therapy (Signed)
[facility] [address] [place], [place], [address] Phone: [phone]   Fax:  [phone]  Physical Therapy Evaluation  Patient Details  Name: [name] MRN: [id number] Date of Birth: 12-30-1950 Referring Provider:  Jene Every, MD  Encounter Date: 09/27/2014      PT End of Session - 09/27/14 1312    Visit Number 1   PT Start Time 1300   PT Stop Time 1400   PT Time Calculation (min) 60 min      Past Medical History  Diagnosis Date  . Anemia   . Diabetes mellitus without complication   . Hypertension   . Hyperlipidemia LDL goal < 70   . CAD (coronary artery disease)     PCI to LAD in 2010, mild residual disease in LCX and RCA  . GERD (gastroesophageal reflux disease)   . Plantar fascia rupture     Left Foot  . Lumbar back pain     Past Surgical History  Procedure Laterality Date  . Tympanoplasty Bilateral   . Carotid stent  2009  . Doppler echocardiography  08/01/2009    EF=>55%,LV normal  . Nm myocar perf wall motion  09/22/2008    lexiscan-EF 83%; glogal LV systolic fx is norm. ,evidence of mild ischemia basal anterior,midanterior and apical lateral region(s).   . Lower arterial duplex  06/20/10    abi's normal,rgt 0.98,lft 1.06;bilateral PVRs normal  . Abdominal hysterectomy    . Coronary angioplasty with stent placement  2010and 10-02-2012    Stent DES, Xience to prox. LAD  . Lumbar laminectomy/decompression microdiscectomy Left 01/06/2013    Procedure: MICRO LUMBAR DECOMPRESSION L4-5 AND L5-S1;  Surgeon: Javier Docker, MD;  Location: [facility];  Service: Orthopedics;  Laterality: Left;  . Left heart catheterization with coronary angiogram N/A 10/02/2012    Procedure: LEFT HEART CATHETERIZATION WITH CORONARY ANGIOGRAM;  Surgeon: Runell Gess, MD;  Location: [facility] CATH LAB;  Service: Cardiovascular;  Laterality: N/A;    There were no vitals filed for this visit.  Visit Diagnosis:  Left-sided  low back pain with left-sided sciatica - Plan: PT plan of care cert/re-cert  Weakness of left lower extremity - Plan: PT plan of care cert/re-cert  Abnormality of gait - Plan: PT plan of care cert/re-cert      Subjective Assessment - 09/27/14 1305    Subjective Reports long hx of L LBP/radiculopathy, had laminectomy (denies fusion) 2 yrs ago and did very well.    In fact was painfree up until this spring when she had several episodes of stepping funny.tripping.       L LBP and radiculopathy returned.     MRI shows L L1-3 disc bulging with L4 extrusion with nerve root compression, L5 disc extrusion with L lateral recess impingement.    Limitations Standing;Walking   How long can you stand comfortably? 30 min   How long can you walk comfortably? if leaning on a grocery cart   Patient Stated Goals be painfree   Currently in Pain? Yes   Pain Score 4    Pain Location Back   Pain Orientation Left;Lower   Pain Descriptors / Indicators Aching;Pins and needles;Radiating;Tingling   Pain Type Chronic pain   Pain Onset More than a month ago   Pain Frequency Constant   Aggravating Factors  standing at kitchen stove or sink   Pain Relieving Factors pain meds   Effect of Pain on Daily Activities all limited  [facility] PT Assessment - 09/27/14 0001    Assessment   Medical Diagnosis disc displacement   Balance Screen   Has the patient fallen in the past 6 months Yes   How many times? 3   Has the patient had a decrease in activity level because of a fear of falling?  Yes   Is the patient reluctant to leave their home because of a fear of falling?  Yes   Prior Function   Vocation Retired   Freight forwarder, Nurse, learning disability   ROM / Strength   AROM / PROM / Strength AROM;Strength   AROM   AROM Assessment Site Lumbar   Lumbar Flexion 60%   Lumbar Extension 40%   Lumbar - Right Side Bend 60%   Lumbar - Left Side Bend 40%   Strength   Overall Strength Comments L ankle: 4/5  DF, 3-/5 PF;   L knee:  4+/5 extension;   4/5 hip flex;   2/5 great toe extension;   unable to heel walk or toe walk LLE   Ambulation/Gait   Ambulation/Gait Yes   Ambulation/Gait Assistance 4: Min guard   Assistive device None   Gait Comments L knee extended, R knee flexed due to leg length inequality,  R lateral shift, wide base, unsteady, stumbles                           PT Education - 09/27/14 1352    Education provided Yes   Education Details post pelvic tilt, tilt w/ HS, march x 20 ea;   R sideglides, lumbar extension x 10 ea   Person(s) Educated Patient   Methods Explanation;Demonstration;Tactile cues;Verbal cues   Comprehension Tactile cues required;Need further instruction;Verbal cues required;Returned demonstration;Verbalized understanding          PT Short Term Goals - 09/27/14 1350    PT SHORT TERM GOAL #1   Title Pt. will have 100% lumbar spine ROM   Time 4   Period Weeks   Status New   PT SHORT TERM GOAL #2   Title Pt. will be independent with HEP   Time 4   Period Weeks   Status New           PT Long Term Goals - 09/27/14 1351    PT LONG TERM GOAL #1   Title Pt. will report 2-3/10 worst with minimal radiculopathy LLE   Time 8   Period Weeks   Status New   PT LONG TERM GOAL #2   Title Pt. will ambulate with no device without loss of balance, holding wall, BERG score of 52   Time 8   Period Weeks   Status New               Plan - 09/27/14 1326    Clinical Impression Statement Pt. has leg length discrepancy w/ short LLE in standing (expected due to rupture p. fascia), but also in supine.   She has R lateral shift.    she is very unsteady on her feet and admits to falling from LLE weakness.   Pt will benefit from skilled therapeutic intervention in order to improve on the following deficits Abnormal gait;Decreased range of motion;Difficulty walking;Decreased strength;Impaired sensation;Postural dysfunction;Pain   Rehab  Potential Good   PT Frequency 3x / week   PT Duration 8 weeks   PT Treatment/Interventions Electrical Stimulation;Cryotherapy;Moist Heat;Traction;Ultrasound;Gait training;Neuromuscular re-education;Balance training;Therapeutic exercise;Therapeutic activities;Patient/family education;Taping   PT Next Visit Plan  traction (if belt fits), stabilization training, review corrective sideglides and extension   Consulted and Agree with Plan of Care Patient         Problem List Patient Active Problem List   Diagnosis Date Noted  . Obesity, Class III, BMI 40-49.9 (morbid obesity) 07/29/2014  . OSA on CPAP 07/29/2014  . Iron deficiency anemia 07/29/2014  . DM (diabetes mellitus) with complications 07/29/2014  . Environmental and seasonal allergies 04/28/2014  . OSA (obstructive sleep apnea) 02/22/2014  . Spinal stenosis, lumbar region, with neurogenic claudication 01/06/2013  . Obesity, morbid, BMI 40.0-49.9 10/20/2012  . Chest pain 10/02/2012  . CAD (coronary artery disease)PCI to LAD in 2010, mild residual disease in LCX and RCA   . Hyperlipidemia with target LDL less than 70   . Diabetes mellitus, type 2 04/16/2012  . HTN (hypertension) 04/16/2012    Ria BushSANDERSON,Shaneisha Burkel, PT 09/27/2014, 3:08 PM  [facility] [address] [place], [place], [address] Phone: [phone]   Fax:  [phone]

## 2014-09-29 ENCOUNTER — Ambulatory Visit: Payer: PPO | Admitting: Physical Therapy

## 2014-09-29 ENCOUNTER — Ambulatory Visit (INDEPENDENT_AMBULATORY_CARE_PROVIDER_SITE_OTHER): Payer: 59 | Admitting: Family Medicine

## 2014-09-29 VITALS — BP 112/70 | HR 65 | Temp 97.8°F | Resp 16 | Ht 61.0 in | Wt 221.0 lb

## 2014-09-29 DIAGNOSIS — M5442 Lumbago with sciatica, left side: Secondary | ICD-10-CM | POA: Diagnosis not present

## 2014-09-29 DIAGNOSIS — I519 Heart disease, unspecified: Secondary | ICD-10-CM | POA: Diagnosis not present

## 2014-09-29 DIAGNOSIS — I10 Essential (primary) hypertension: Secondary | ICD-10-CM | POA: Diagnosis not present

## 2014-09-29 DIAGNOSIS — R269 Unspecified abnormalities of gait and mobility: Secondary | ICD-10-CM

## 2014-09-29 DIAGNOSIS — R29898 Other symptoms and signs involving the musculoskeletal system: Secondary | ICD-10-CM

## 2014-09-29 DIAGNOSIS — E119 Type 2 diabetes mellitus without complications: Secondary | ICD-10-CM | POA: Diagnosis not present

## 2014-09-29 LAB — COMPREHENSIVE METABOLIC PANEL
ALK PHOS: 116 U/L (ref 39–117)
ALT: 21 U/L (ref 0–35)
AST: 16 U/L (ref 0–37)
Albumin: 4.3 g/dL (ref 3.5–5.2)
BUN: 23 mg/dL (ref 6–23)
CALCIUM: 10 mg/dL (ref 8.4–10.5)
CHLORIDE: 102 meq/L (ref 96–112)
CO2: 27 mEq/L (ref 19–32)
Creat: 0.82 mg/dL (ref 0.50–1.10)
GLUCOSE: 122 mg/dL — AB (ref 70–99)
Potassium: 4.2 mEq/L (ref 3.5–5.3)
SODIUM: 139 meq/L (ref 135–145)
Total Bilirubin: 0.3 mg/dL (ref 0.2–1.2)
Total Protein: 7 g/dL (ref 6.0–8.3)

## 2014-09-29 LAB — MICROALBUMIN, URINE: Microalb, Ur: 0.2 mg/dL (ref <2.0)

## 2014-09-29 LAB — CBC
HEMATOCRIT: 38.4 % (ref 36.0–46.0)
HEMOGLOBIN: 12.4 g/dL (ref 12.0–15.0)
MCH: 25.1 pg — ABNORMAL LOW (ref 26.0–34.0)
MCHC: 32.3 g/dL (ref 30.0–36.0)
MCV: 77.6 fL — ABNORMAL LOW (ref 78.0–100.0)
MPV: 9.3 fL (ref 8.6–12.4)
Platelets: 363 10*3/uL (ref 150–400)
RBC: 4.95 MIL/uL (ref 3.87–5.11)
RDW: 15 % (ref 11.5–15.5)
WBC: 9.3 10*3/uL (ref 4.0–10.5)

## 2014-09-29 LAB — LIPID PANEL
CHOLESTEROL: 160 mg/dL (ref 0–200)
HDL: 27 mg/dL — ABNORMAL LOW (ref >=46)
LDL CALC: 78 mg/dL (ref 0–99)
TRIGLYCERIDES: 274 mg/dL — AB (ref <150)
Total CHOL/HDL Ratio: 5.9 Ratio
VLDL: 55 mg/dL — AB (ref 0–40)

## 2014-09-29 LAB — HEMOGLOBIN A1C
Hgb A1c MFr Bld: 6.9 % — ABNORMAL HIGH (ref <5.7)
Mean Plasma Glucose: 151 mg/dL — ABNORMAL HIGH (ref <117)

## 2014-09-29 NOTE — Progress Notes (Signed)
[facility] [address], [place] [address] [phone]  Date:  09/29/2014   Name:  [name]   DOB:  1951-01-09   MRN:  [id number]  PCP:  Abbe Amsterdam, MD    Chief Complaint: blood work; Medication Refill; and Rash   History of Present Illness:  [name] is a 64 y.o. very pleasant female patient who presents with the following:  Here today to recheck labs and DM.  She is fasting today for labs She saw Hilty last week- she is doing ok.  He would like a chl panel drawn Her DM has been under good control Donnika has had some concerns about her memory which we had attributed to stress.  She was seen by neurology and her MRI shows chronic small vessel disease- went over this with her as below.  [name] states that she as told there is nothing more serious causing her memory issues but she is still a bit worried.    IMPRESSION:  Mildly abnormal MRI brain (without) demonstrating: 1. Few subcortical and juxtacortical foci of non-specific gliosis, likely chronic small vessel ischemic disease.  2. No acute findings.  Lab Results  Component Value Date   HGBA1C 6.3 12/01/2013     Patient Active Problem List   Diagnosis Date Noted  . Obesity, Class III, BMI 40-49.9 (morbid obesity) 07/29/2014  . OSA on CPAP 07/29/2014  . Iron deficiency anemia 07/29/2014  . DM (diabetes mellitus) with complications 07/29/2014  . Environmental and seasonal allergies 04/28/2014  . OSA (obstructive sleep apnea) 02/22/2014  . Spinal stenosis, lumbar region, with neurogenic claudication 01/06/2013  . Obesity, morbid, BMI 40.0-49.9 10/20/2012  . Chest pain 10/02/2012  . CAD (coronary artery disease)PCI to LAD in 2010, mild residual disease in LCX and RCA   . Hyperlipidemia with target LDL less than 70   . Diabetes mellitus, type 2 04/16/2012  . HTN (hypertension) 04/16/2012    Past Medical History  Diagnosis Date  . Anemia   . Diabetes mellitus without complication    . Hypertension   . Hyperlipidemia LDL goal < 70   . CAD (coronary artery disease)     PCI to LAD in 2010, mild residual disease in LCX and RCA  . GERD (gastroesophageal reflux disease)   . Plantar fascia rupture     Left Foot  . Lumbar back pain     Past Surgical History  Procedure Laterality Date  . Tympanoplasty Bilateral   . Carotid stent  2009  . Doppler echocardiography  08/01/2009    EF=>55%,LV normal  . Nm myocar perf wall motion  09/22/2008    lexiscan-EF 83%; glogal LV systolic fx is norm. ,evidence of mild ischemia basal anterior,midanterior and apical lateral region(s).   . Lower arterial duplex  06/20/10    abi's normal,rgt 0.98,lft 1.06;bilateral PVRs normal  . Abdominal hysterectomy    . Coronary angioplasty with stent placement  2010and 10-02-2012    Stent DES, Xience to prox. LAD  . Lumbar laminectomy/decompression microdiscectomy Left 01/06/2013    Procedure: MICRO LUMBAR DECOMPRESSION L4-5 AND L5-S1;  Surgeon: Javier Docker, MD;  Location: [facility];  Service: Orthopedics;  Laterality: Left;  . Left heart catheterization with coronary angiogram N/A 10/02/2012    Procedure: LEFT HEART CATHETERIZATION WITH CORONARY ANGIOGRAM;  Surgeon: Runell Gess, MD;  Location: [facility] CATH LAB;  Service: Cardiovascular;  Laterality: N/A;  . Uvulopalatopharyngoplasty      History  Substance Use Topics  .  Smoking status: Never Smoker   . Smokeless tobacco: Never Used  . Alcohol Use: Yes     Comment: occasional, 1 a month wine    Family History  Problem Relation Age of Onset  . Coronary artery disease Mother   . Rheum arthritis Mother   . Dementia Mother   . Heart attack Father   . Hypertension Father   . Hyperlipidemia Father   . Other Father     MVA  . Hypertension Brother   . Cancer Brother   . Heart disease Brother   . Cancer Paternal Grandmother     stomach  . Diabetes Paternal Grandfather     Allergies  Allergen Reactions  . Niacin And Related Other (See  Comments)    Whelps and skin flushed, mouth tingling  . Tolectin [Tolmetin]     Medication list has been reviewed and updated.  Current Outpatient Prescriptions on File Prior to Visit  Medication Sig Dispense Refill  . aspirin EC 81 MG tablet Take 1 tablet (81 mg total) by mouth daily. Resume 5 days post-op    . cetirizine (ZYRTEC) 10 MG tablet Take 1 tablet (10 mg total) by mouth daily. (Patient taking differently: Take 10 mg by mouth as needed. ) 30 tablet 3  . cholecalciferol (VITAMIN D) 1000 UNITS tablet Take 1,000 Units by mouth daily.    . clopidogrel (PLAVIX) 75 MG tablet Take 1 tablet (75 mg total) by mouth daily. 30 tablet 1  . cyclobenzaprine (FLEXERIL) 10 MG tablet TAKE 1 TABLET BY MOUTH 3 TIMES DAILY AS NEEDED FOR MUSCLE SPASMS. "OV NEEDED FOR ADDITIONAL REFILLS" 30 tablet 0  . diazepam (VALIUM) 2 MG tablet Take 1 tablet (2 mg total) by mouth every 8 (eight) hours as needed. For muscle spasm 30 tablet 0  . famotidine (PEPCID) 20 MG tablet TAKE 1 TABLET BY MOUTH 2 TIMES DAILY. 180 tablet 0  . fluticasone (FLONASE) 50 MCG/ACT nasal spray Place 2 sprays into both nostrils daily. Take two sprays in each nostril daily. (Patient taking differently: Place 2 sprays into both nostrils as needed. Take two sprays in each nostril daily.) 16 g 12  . hydrochlorothiazide (HYDRODIURIL) 25 MG tablet TAKE 1 TABLET BY MOUTH ONCE DAILY IN THE MORNING 90 tablet 0  . metFORMIN (GLUCOPHAGE-XR) 500 MG 24 hr tablet TAKE 1 TABLET (500 MG TOTAL) BY MOUTH DAILY WITH BREAKFAST.  "OV NEEDED FOR ADDITIONAL REFILLS" 180 tablet 0  . metoprolol succinate (TOPROL-XL) 25 MG 24 hr tablet Take 0.5 tablets (12.5 mg total) by mouth daily. 15 tablet 6  . potassium chloride SA (K-DUR,KLOR-CON) 20 MEQ tablet Take 1 tablet (20 mEq total) by mouth daily. 90 tablet 3  . simvastatin (ZOCOR) 40 MG tablet Take 1 tablet (40 mg total) by mouth every evening. 90 tablet 3  . telmisartan (MICARDIS) 40 MG tablet Take 1 tablet (40 mg  total) by mouth every morning. 90 tablet 3  . traMADol (ULTRAM) 50 MG tablet TAKE 1 TABLET BY MOUTH EVERY 6 HOURS AS NEEDED 30 tablet 0  . TRUETEST TEST test strip TEST ONCE DAILY AS DIRECTED 100 each 3   No current facility-administered medications on file prior to visit.    Review of Systems:  As per HPI- otherwise negative.   Physical Examination: Filed Vitals:   09/29/14 0817  BP: 112/70  Pulse: 65  Temp: 97.8 F (36.6 C)  Resp: 16   Filed Vitals:   09/29/14 0817  Height: 5\' 1"  (1.549 m)  Weight:  221 lb (100.245 kg)   Body mass index is 41.78 kg/(m^2). Ideal Body Weight: Weight in (lb) to have BMI = 25: 132  GEN: WDWN, NAD, Non-toxic, A & O x 3, obese, looks well HEENT: Atraumatic, Normocephalic. Neck supple. No masses, No LAD. Ears and Nose: No external deformity. CV: RRR, No M/G/R. No JVD. No thrill. No extra heart sounds. PULM: CTA B, no wheezes, crackles, rhonchi. No retractions. No resp. distress. No accessory muscle use. ABD: S, NT, ND EXTR: No c/c/e NEURO moving a little slowly, reports that she has thrown out her back again- Dr. Shelle Iron is helping her with this PSYCH: Normally interactive. Conversant. Not depressed or anxious appearing.  Calm demeanor.  Foot exam normal  Assessment and Plan: Diabetes mellitus type 2, controlled - Plan: Hemoglobin A1c, Microalbumin, urine, HM DIABETES FOOT EXAM  Essential hypertension - Plan: Comprehensive metabolic panel  Heart disease - Plan: CBC, Lipid panel  Labs pending as above BP looks fine, her A1c is generally ok Immunizations UTD Await labs and will be in touch with her Reassured that her memory concerns are likely benign but she will continue to watch this and let me know if anything changes.  Encouraged her to optimize her health to prevent further vascular damage- control a1c, BP, cholesterol, exercise, eat right and use CPAP  Signed Abbe Amsterdam, MD

## 2014-09-29 NOTE — Patient Instructions (Signed)
Good to see you - I will be in touch with your labs asap  ?

## 2014-09-29 NOTE — Therapy (Signed)
[facility] [place], [place], [address] Phone: [phone]   Fax:  [phone]  Physical Therapy Treatment  Patient Details  Name: [name] MRN: [id number] Date of Birth: 1950-08-18 Referring Provider:  Susa Day, MD  Encounter Date: 09/29/2014      PT End of Session - 09/29/14 1540    Visit Number 2   PT Start Time 5670   PT Stop Time 1630   PT Time Calculation (min) 58 min      Past Medical History  Diagnosis Date  . Anemia   . Diabetes mellitus without complication   . Hypertension   . Hyperlipidemia LDL goal < 70   . CAD (coronary artery disease)     PCI to LAD in 2010, mild residual disease in LCX and RCA  . GERD (gastroesophageal reflux disease)   . Plantar fascia rupture     Left Foot  . Lumbar back pain     Past Surgical History  Procedure Laterality Date  . Tympanoplasty Bilateral   . Carotid stent  2009  . Doppler echocardiography  08/01/2009    EF=>55%,LV normal  . Nm myocar perf wall motion  09/22/2008    lexiscan-EF 83%; glogal LV systolic fx is norm. ,evidence of mild ischemia basal anterior,midanterior and apical lateral region(s).   . Lower arterial duplex  06/20/10    abi's normal,rgt 0.98,lft 1.06;bilateral PVRs normal  . Abdominal hysterectomy    . Coronary angioplasty with stent placement  2010and 10-02-2012    Stent DES, Xience to prox. LAD  . Lumbar laminectomy/decompression microdiscectomy Left 01/06/2013    Procedure: MICRO LUMBAR DECOMPRESSION L4-5 AND L5-S1;  Surgeon: Johnn Hai, MD;  Location: [facility];  Service: Orthopedics;  Laterality: Left;  . Left heart catheterization with coronary angiogram N/A 10/02/2012    Procedure: LEFT HEART CATHETERIZATION WITH CORONARY ANGIOGRAM;  Surgeon: Lorretta Harp, MD;  Location: [facility] CATH LAB;  Service: Cardiovascular;  Laterality: N/A;  . Uvulopalatopharyngoplasty      There were no vitals filed for this  visit.  Visit Diagnosis:  Left-sided low back pain with left-sided sciatica  Weakness of left lower extremity  Abnormality of gait      Subjective Assessment - 09/29/14 1539    Subjective haven't gotten much HEP done due to sister is hospital EC, and cousin passed away since last visit. bought a RW yesterday. She used it at the hosipital and did well it.    Limitations Standing;Walking   How long can you stand comfortably? 30 min   How long can you walk comfortably? if leaning on a grocery cart   Patient Stated Goals be painfree   Currently in Pain? No/denies                         [facility] Adult PT Treatment/Exercise - 09/29/14 0001    Ambulation/Gait   Ambulation/Gait Yes   Ambulation/Gait Assistance 5: Supervision   Assistive device Rolling walker  she has one at home now and performed well today   Gait Comments L knee   Exercises   Exercises Other Exercises   Other Exercises  lumbar   Modalities   Modalities Traction;Moist Heat   Moist Heat Therapy   Number Minutes Moist Heat 20 Minutes   Moist Heat Location Other (comment);Shoulder  lumbar seated   Traction   Type of Traction Lumbar   Min (lbs) 45   Max (  lbs) 45   Hold Time 2   Time 12                  PT Short Term Goals - 09/27/14 1350    PT SHORT TERM GOAL #1   Title Pt. will have 100% lumbar spine ROM   Time 4   Period Weeks   Status New   PT SHORT TERM GOAL #2   Title Pt. will be independent with HEP   Time 4   Period Weeks   Status New           PT Long Term Goals - 09/27/14 1351    PT LONG TERM GOAL #1   Title Pt. will report 2-3/10 worst with minimal radiculopathy LLE   Time 8   Period Weeks   Status New   PT LONG TERM GOAL #2   Title Pt. will ambulate with no device without loss of balance, holding wall, BERG score of 52   Time 8   Period Weeks   Status New               Plan - 09/29/14 1605    Clinical Impression Statement Pt will get to HEP  this weekend. She is dealing with lots of family emergencies. She is well motivated for PT and wants to avoid injections and surgery. Pt experienced pin prick sensations with traction today. She says it feels like it tightened up and spasms.  Use MHP and this is helping.  Pain was 4/10 and now is 0/10 after MHP.    Pt will benefit from skilled therapeutic intervention in order to improve on the following deficits Abnormal gait;Decreased range of motion;Difficulty walking;Decreased strength;Impaired sensation;Postural dysfunction;Pain   Rehab Potential Good   PT Frequency 3x / week   PT Duration 8 weeks   PT Next Visit Plan Assess HEP, Assess Traction. Pt was in pain just after traction, then 0/10 with MHP.    Consulted and Agree with Plan of Care Patient          G-Codes - 09/28/14 1540    Functional Limitation Mobility: Walking and moving around   Mobility: Walking and Moving Around Current Status [phone]) At least 60 percent but less than 80 percent impaired, limited or restricted   Mobility: Walking and Moving Around Goal Status [phone]([id number]) At least 40 percent but less than 60 percent impaired, limited or restricted      Problem List Patient Active Problem List   Diagnosis Date Noted  . Obesity, Class III, BMI 40-49.9 (morbid obesity) 07/29/2014  . OSA on CPAP 07/29/2014  . Iron deficiency anemia 07/29/2014  . DM (diabetes mellitus) with complications 07/29/2014  . Environmental and seasonal allergies 04/28/2014  . OSA (obstructive sleep apnea) 02/22/2014  . Spinal stenosis, lumbar region, with neurogenic claudication 01/06/2013  . Obesity, morbid, BMI 40.0-49.9 10/20/2012  . Chest pain 10/02/2012  . CAD (coronary artery disease)PCI to LAD in 2010, mild residual disease in LCX and RCA   . Hyperlipidemia with target LDL less than 70   . Diabetes mellitus, type 2 04/16/2012  . HTN (hypertension) 04/16/2012     Lady DeutscherSheila Parnell Judd Mccubbin, PTA  09/29/2014, 4:21 PM  [facility] [address] [place], [place], [address] Phone: [phone]   Fax:  [phone]

## 2014-09-29 NOTE — Therapy (Addendum)
[place], [place], [address] Phone: [phone]   Fax:  [phone]  Physical Therapy Treatment  Patient Details  Name: [name] MRN: [id number] Date of Birth: 1950-08-18 Referring Provider:  Susa Day, MD  Encounter Date: 09/29/2014      PT End of Session - 09/29/14 1540    Visit Number 2   PT Start Time 5670   PT Stop Time 1630   PT Time Calculation (min) 58 min      Past Medical History  Diagnosis Date  . Anemia   . Diabetes mellitus without complication   . Hypertension   . Hyperlipidemia LDL goal < 70   . CAD (coronary artery disease)     PCI to LAD in 2010, mild residual disease in LCX and RCA  . GERD (gastroesophageal reflux disease)   . Plantar fascia rupture     Left Foot  . Lumbar back pain     Past Surgical History  Procedure Laterality Date  . Tympanoplasty Bilateral   . Carotid stent  2009  . Doppler echocardiography  08/01/2009    EF=>55%,LV normal  . Nm myocar perf wall motion  09/22/2008    lexiscan-EF 83%; glogal LV systolic fx is norm. ,evidence of mild ischemia basal anterior,midanterior and apical lateral region(s).   . Lower arterial duplex  06/20/10    abi's normal,rgt 0.98,lft 1.06;bilateral PVRs normal  . Abdominal hysterectomy    . Coronary angioplasty with stent placement  2010and 10-02-2012    Stent DES, Xience to prox. LAD  . Lumbar laminectomy/decompression microdiscectomy Left 01/06/2013    Procedure: MICRO LUMBAR DECOMPRESSION L4-5 AND L5-S1;  Surgeon: Johnn Hai, MD;  Location: [facility];  Service: Orthopedics;  Laterality: Left;  . Left heart catheterization with coronary angiogram N/A 10/02/2012    Procedure: LEFT HEART CATHETERIZATION WITH CORONARY ANGIOGRAM;  Surgeon: Lorretta Harp, MD;  Location: [facility] CATH LAB;  Service: Cardiovascular;  Laterality: N/A;  . Uvulopalatopharyngoplasty      There were no vitals filed for this  visit.  Visit Diagnosis:  Left-sided low back pain with left-sided sciatica  Weakness of left lower extremity  Abnormality of gait      Subjective Assessment - 09/29/14 1539    Subjective haven't gotten much HEP done due to sister is hospital EC, and cousin passed away since last visit. bought a RW yesterday. She used it at the hosipital and did well it.    Limitations Standing;Walking   How long can you stand comfortably? 30 min   How long can you walk comfortably? if leaning on a grocery cart   Patient Stated Goals be painfree   Currently in Pain? No/denies                         [facility] Adult PT Treatment/Exercise - 09/29/14 0001    Ambulation/Gait   Ambulation/Gait Yes   Ambulation/Gait Assistance 5: Supervision   Assistive device Rolling walker  she has one at home now and performed well today   Gait Comments L knee   Exercises   Exercises Other Exercises   Other Exercises  lumbar   Modalities   Modalities Traction;Moist Heat   Moist Heat Therapy   Number Minutes Moist Heat 20 Minutes   Moist Heat Location Other (comment);Shoulder  lumbar seated   Traction   Type of Traction Lumbar   Min (lbs) 45   Max (  lbs) 45   Hold Time 2   Time 12                  PT Short Term Goals - 09/27/14 1350    PT SHORT TERM GOAL #1   Title Pt. will have 100% lumbar spine ROM   Time 4   Period Weeks   Status New   PT SHORT TERM GOAL #2   Title Pt. will be independent with HEP   Time 4   Period Weeks   Status New           PT Long Term Goals - 09/27/14 1351    PT LONG TERM GOAL #1   Title Pt. will report 2-3/10 worst with minimal radiculopathy LLE   Time 8   Period Weeks   Status New   PT LONG TERM GOAL #2   Title Pt. will ambulate with no device without loss of balance, holding wall, BERG score of 52   Time 8   Period Weeks   Status New               Plan - 09/29/14 1605    Clinical Impression Statement Pt will get to HEP  this weekend. She is dealing with lots of family emergencies. She is well motivated for PT and wants to avoid injections and surgery. Pt experienced pin prick sensations with traction today. She says it feels like it tightened up and spasms.  Use MHP and this is helping.  Pain was 4/10 and now is 0/10 after MHP.    Pt will benefit from skilled therapeutic intervention in order to improve on the following deficits Abnormal gait;Decreased range of motion;Difficulty walking;Decreased strength;Impaired sensation;Postural dysfunction;Pain   Rehab Potential Good   PT Frequency 3x / week   PT Duration 8 weeks   PT Next Visit Plan Assess HEP, Assess Traction. Pt was in pain just after traction, then 0/10 with MHP.    Consulted and Agree with Plan of Care Patient          G-Codes - 2014/10/17 1540    Functional Limitation Mobility: Walking and moving around   Mobility: Walking and Moving Around Current Status [phone]) At least 60 percent but less than 80 percent impaired, limited or restricted   Mobility: Walking and Moving Around Goal Status [phone]) At least 40 percent but less than 60 percent impaired, limited or restricted      Problem List Patient Active Problem List   Diagnosis Date Noted  . Obesity, Class III, BMI 40-49.9 (morbid obesity) 07/29/2014  . OSA on CPAP 07/29/2014  . Iron deficiency anemia 07/29/2014  . DM (diabetes mellitus) with complications 53/97/6734  . Environmental and seasonal allergies 04/28/2014  . OSA (obstructive sleep apnea) 02/22/2014  . Spinal stenosis, lumbar region, with neurogenic claudication 01/06/2013  . Obesity, morbid, BMI 40.0-49.9 10/20/2012  . Chest pain 10/02/2012  . CAD (coronary artery disease)PCI to LAD in 2010, mild residual disease in LCX and RCA   . Hyperlipidemia with target LDL less than 70   . Diabetes mellitus, type 2 04/16/2012  . HTN (hypertension) 04/16/2012   PHYSICAL THERAPY DISCHARGE SUMMARY  Visits from Start of Care:  2  Plan: Patient agrees to discharge.  Patient goals were not met. Patient is being discharged due to not returning since the last visit.  ?????        Natividad Brood, PTA  09/29/2014, 4:58 PM  [place]  [address] [place], [place], [address] Phone: [phone]   Fax:  [phone]

## 2014-10-03 ENCOUNTER — Telehealth: Payer: Self-pay

## 2014-10-03 NOTE — Telephone Encounter (Signed)
10/03/14 patient cancelled PT appts, to have surgery next week

## 2014-10-04 ENCOUNTER — Ambulatory Visit: Payer: PPO | Admitting: Physical Therapy

## 2014-10-04 ENCOUNTER — Ambulatory Visit: Payer: Self-pay | Admitting: Orthopedic Surgery

## 2014-10-05 ENCOUNTER — Other Ambulatory Visit: Payer: Self-pay | Admitting: Family Medicine

## 2014-10-06 ENCOUNTER — Ambulatory Visit: Payer: PPO | Admitting: Physical Therapy

## 2014-10-06 ENCOUNTER — Telehealth: Payer: Self-pay | Admitting: Neurology

## 2014-10-06 ENCOUNTER — Encounter (HOSPITAL_COMMUNITY)
Admission: RE | Admit: 2014-10-06 | Discharge: 2014-10-06 | Disposition: A | Discharge status: Home / Self Care | Payer: PPO | Source: Ambulatory Visit | Attending: Specialist | Admitting: Specialist

## 2014-10-06 ENCOUNTER — Encounter (HOSPITAL_COMMUNITY): Payer: Self-pay

## 2014-10-06 ENCOUNTER — Ambulatory Visit (HOSPITAL_COMMUNITY)
Admission: RE | Admit: 2014-10-06 | Discharge: 2014-10-06 | Disposition: A | Discharge status: Home / Self Care | Payer: PPO | Source: Ambulatory Visit | Attending: Orthopedic Surgery | Admitting: Orthopedic Surgery

## 2014-10-06 DIAGNOSIS — M5126 Other intervertebral disc displacement, lumbar region: Secondary | ICD-10-CM

## 2014-10-06 DIAGNOSIS — M5136 Other intervertebral disc degeneration, lumbar region: Secondary | ICD-10-CM | POA: Diagnosis not present

## 2014-10-06 HISTORY — DX: Sleep apnea, unspecified: G47.30

## 2014-10-06 HISTORY — DX: Family history of other specified conditions: Z84.89

## 2014-10-06 HISTORY — DX: Unspecified cataract: H26.9

## 2014-10-06 HISTORY — DX: Bronchitis, not specified as acute or chronic: J40

## 2014-10-06 HISTORY — DX: Anesthesia of skin: R20.0

## 2014-10-06 LAB — BASIC METABOLIC PANEL
ANION GAP: 7 (ref 5–15)
BUN: 15 mg/dL (ref 6–20)
CALCIUM: 9.5 mg/dL (ref 8.9–10.3)
CO2: 27 mmol/L (ref 22–32)
Chloride: 105 mmol/L (ref 101–111)
Creatinine, Ser: 0.75 mg/dL (ref 0.44–1.00)
GFR calc non Af Amer: >60 mL/min (ref >60)
Glucose, Bld: 138 mg/dL — ABNORMAL HIGH (ref 65–99)
Potassium: 4.3 mmol/L (ref 3.5–5.1)
Sodium: 139 mmol/L (ref 135–145)

## 2014-10-06 LAB — CBC
HEMATOCRIT: 37.8 % (ref 36.0–46.0)
Hemoglobin: 12.1 g/dL (ref 12.0–15.0)
MCH: 25.2 pg — AB (ref 26.0–34.0)
MCHC: 32 g/dL (ref 30.0–36.0)
MCV: 78.8 fL (ref 78.0–100.0)
Platelets: 291 10*3/uL (ref 150–400)
RBC: 4.8 MIL/uL (ref 3.87–5.11)
RDW: 14.4 % (ref 11.5–15.5)
WBC: 9.6 10*3/uL (ref 4.0–10.5)

## 2014-10-06 LAB — SURGICAL PCR SCREEN
MRSA, PCR: NEGATIVE
Staphylococcus aureus: POSITIVE — AB

## 2014-10-06 NOTE — Telephone Encounter (Signed)
Spoke to pt. [facility]. I called [facility] and spoke to NeogaSheila, who assured me that they have the order and what they need, but was unsure why the pt was not contacted. She said that the pt would be contacted today.  I relayed this info to the pt and she verbalized understanding and gratitude.

## 2014-10-06 NOTE — Telephone Encounter (Signed)
Patient says [facility] will take her insurance and she would like for you to send the information to them.  She wants you to call her back, however she is leaving for the beach and would like for you to call her until you reach her.

## 2014-10-06 NOTE — Progress Notes (Signed)
Surgical screening results per epic per PAT visit 10/06/2014 positive for STAPH. Results sent to Dr Shelle IronBeane. Prescription for Mupriocin Ointment [facility].

## 2014-10-06 NOTE — Progress Notes (Addendum)
Cardiology/Dr Hilty OV note/clearance per epic 09/21/2014  EKG epic 09/21/2014 ECHO epic 08/01/2009

## 2014-10-06 NOTE — Progress Notes (Signed)
Spoke with [name] [facility]Ashley [name] in regards to Mupriocin Ointment. Pt is aware of prescription and lab results.

## 2014-10-06 NOTE — Patient Instructions (Signed)
Leanna SatoHelen J Arreguin  10/06/2014   Your procedure is scheduled on: Wednesday October 12, 2014  [facility]  [place]  elevators to 3rd floor to  Short Stay Center at 9:00 AM.  Call this number if you have problems the morning of surgery [phone]   Remember: ONLY 1 PERSON MAY GO WITH YOU TO SHORT STAY TO GET  READY MORNING OF YOUR SURGERY.  Do not eat food or drink liquids :After Midnight.     Take these medicines the morning of surgery with A SIP OF WATER: Cetirizine (Zyrtec) if needed; Diazepam (Valium) if needed; Famotidine (Pepcid); Flonase if needed; Metoprolol                                You may not have any metal on your body including hair pins and              piercings  Do not wear jewelry, make-up, lotions, powders or perfumes, deodorant             Do not wear nail polish.  Do not shave  48 hours prior to surgery.              Do not bring valuables to the hospital. [facility] IS NOT             RESPONSIBLE   FOR VALUABLES.  Contacts, dentures or bridgework may not be worn into surgery.  Leave suitcase in the car. After surgery it may be brought to your room.                  Please read over the following fact sheets you were given:MRSA INFORMATION SHEET; INCENTIVE SPIROMETER  _____________________________________________________________________             [facility] - Preparing for Surgery Before surgery, you can play an important role.  Because skin is not sterile, your skin needs to be as free of germs as possible.  You can reduce the number of germs on your skin by washing with CHG (chlorahexidine gluconate) soap before surgery.  CHG is an antiseptic cleaner which kills germs and bonds with the skin to continue killing germs even after washing. Please DO NOT use if you have an allergy to CHG or antibacterial soaps.  If your skin becomes reddened/irritated stop using the CHG and inform your nurse when you arrive at Short  Stay. Do not shave (including legs and underarms) for at least 48 hours prior to the first CHG shower.  You may shave your face/neck. Please follow these instructions carefully:  1.  Shower with CHG Soap the night before surgery and the  morning of Surgery.  2.  If you choose to wash your hair, wash your hair first as usual with your  normal  shampoo.  3.  After you shampoo, rinse your hair and body thoroughly to remove the  shampoo.                           4.  Use CHG as you would any other liquid soap.  You can apply chg directly  to the skin and wash                       Gently with a  scrungie or clean washcloth.  5.  Apply the CHG Soap to your body ONLY FROM THE NECK DOWN.   Do not use on face/ open                           Wound or open sores. Avoid contact with eyes, ears mouth and genitals (private parts).                       Wash face,  Genitals (private parts) with your normal soap.             6.  Wash thoroughly, paying special attention to the area where your surgery  will be performed.  7.  Thoroughly rinse your body with warm water from the neck down.  8.  DO NOT shower/wash with your normal soap after using and rinsing off  the CHG Soap.                9.  Pat yourself dry with a clean towel.            10.  Wear clean pajamas.            11.  Place clean sheets on your bed the night of your first shower and do not  sleep with pets. Day of Surgery : Do not apply any lotions/deodorants the morning of surgery.  Please wear clean clothes to the hospital/surgery center.  FAILURE TO FOLLOW THESE INSTRUCTIONS MAY RESULT IN THE CANCELLATION OF YOUR SURGERY PATIENT SIGNATURE_________________________________  NURSE SIGNATURE__________________________________  ________________________________________________________________________   Adam Phenix  An incentive spirometer is a tool that can help keep your lungs clear and active. This tool measures how well you are  filling your lungs with each breath. Taking long deep breaths may help reverse or decrease the chance of developing breathing (pulmonary) problems (especially infection) following:  A long period of time when you are unable to move or be active. BEFORE THE PROCEDURE   If the spirometer includes an indicator to show your best effort, your nurse or respiratory therapist will set it to a desired goal.  If possible, sit up straight or lean slightly forward. Try not to slouch.  Hold the incentive spirometer in an upright position. INSTRUCTIONS FOR USE  1. Sit on the edge of your bed if possible, or sit up as far as you can in bed or on a chair. 2. Hold the incentive spirometer in an upright position. 3. Breathe out normally. 4. Place the mouthpiece in your mouth and seal your lips tightly around it. 5. Breathe in slowly and as deeply as possible, raising the piston or the ball toward the top of the column. 6. Hold your breath for 3-5 seconds or for as long as possible. Allow the piston or ball to fall to the bottom of the column. 7. Remove the mouthpiece from your mouth and breathe out normally. 8. Rest for a few seconds and repeat Steps 1 through 7 at least 10 times every 1-2 hours when you are awake. Take your time and take a few normal breaths between deep breaths. 9. The spirometer may include an indicator to show your best effort. Use the indicator as a goal to work toward during each repetition. 10. After each set of 10 deep breaths, practice coughing to be sure your lungs are clear. If you have an incision (the cut made at the time of surgery), support your incision  when coughing by placing a pillow or rolled up towels firmly against it. Once you are able to get out of bed, walk around indoors and cough well. You may stop using the incentive spirometer when instructed by your caregiver.  RISKS AND COMPLICATIONS  Take your time so you do not get dizzy or light-headed.  If you are in pain,  you may need to take or ask for pain medication before doing incentive spirometry. It is harder to take a deep breath if you are having pain. AFTER USE  Rest and breathe slowly and easily.  It can be helpful to keep track of a log of your progress. Your caregiver can provide you with a simple table to help with this. If you are using the spirometer at home, follow these instructions: [place] IF:   You are having difficultly using the spirometer.  You have trouble using the spirometer as often as instructed.  Your pain medication is not giving enough relief while using the spirometer.  You develop fever of 100.5 F (38.1 C) or higher. SEEK IMMEDIATE MEDICAL CARE IF:   You cough up bloody sputum that had not been present before.  You develop fever of 102 F (38.9 C) or greater.  You develop worsening pain at or near the incision site. MAKE SURE YOU:   Understand these instructions.  Will watch your condition.  Will get help right away if you are not doing well or get worse. Document Released: 07/22/2006 Document Revised: 06/03/2011 Document Reviewed: 09/22/2006 [facility] Patient Information 2014 [place], [place].   ________________________________________________________________________

## 2014-10-07 ENCOUNTER — Ambulatory Visit: Payer: Self-pay | Admitting: Orthopedic Surgery

## 2014-10-10 ENCOUNTER — Ambulatory Visit: Payer: Self-pay | Admitting: Orthopedic Surgery

## 2014-10-10 NOTE — H&P (Signed)
[name] is an 63 y.o. female.   Chief Complaint: back and left leg pain, weakness HPI: The patient is a 63 year old female who presents today for follow up of their back. The patient is being followed for their low back symptoms. They are now 2 1/2 months out from a flare up. The patient is 20 1/2 months out from srugery. Symptoms reported today include: pain. Current treatment includes: physical therapy, home exercise program, activity modification, pain medications and muscle relaxer and the use of a cane. The following medication has been used for pain control: Tylenol and Robaxin. The patient presents today following physical therapy.  She still has some burning in the legs. She is doing physical therapy. She is discussing whether she wants to proceed, discussed surgery. She still has the weakness.  Past Medical History  Diagnosis Date  . Anemia   . Diabetes mellitus without complication   . Hypertension   . Hyperlipidemia LDL goal < 70   . CAD (coronary artery disease)     PCI to LAD in 2010, mild residual disease in LCX and RCA  . GERD (gastroesophageal reflux disease)   . Plantar fascia rupture     Left Foot  . Lumbar back pain   . Family history of adverse reaction to anesthesia     pts mother had difficulty awakening   . Anginal pain     pt relates to stress pt was seen by [name] 3 wks ago was given metoprolol   . Sleep apnea     uses CPAP   . Bronchitis     hx of   . Numbness     left leg and foot   . Cataracts, bilateral     Past Surgical History  Procedure Laterality Date  . Tympanoplasty Bilateral   . Carotid stent  2009    pt denies   . Doppler echocardiography  08/01/2009    EF=>55%,LV normal  . Nm myocar perf wall motion  09/22/2008    lexiscan-EF 83%; glogal LV systolic fx is norm. ,evidence of mild ischemia basal anterior,midanterior and apical lateral region(s).   . Lower arterial duplex  06/20/10    abi's normal,rgt 0.98,lft 1.06;bilateral PVRs  normal  . Abdominal hysterectomy    . Coronary angioplasty with stent placement  2010and 10-02-2012    Stent DES, Xience to prox. LAD  . Lumbar laminectomy/decompression microdiscectomy Left 01/06/2013    Procedure: MICRO LUMBAR DECOMPRESSION L4-5 AND L5-S1;  Surgeon: [name] C Beane, MD;  Location: [facility];  Service: Orthopedics;  Laterality: Left;  . Left heart catheterization with coronary angiogram N/A 10/02/2012    Procedure: LEFT HEART CATHETERIZATION WITH CORONARY ANGIOGRAM;  Surgeon: [name] J Berry, MD;  Location: [facility] CATH LAB;  Service: Cardiovascular;  Laterality: N/A;  . Uvulopalatopharyngoplasty      pt denies   . Tubal ligation      Family History  Problem Relation Age of Onset  . Coronary artery disease Mother   . Rheum arthritis Mother   . Dementia Mother   . Heart attack Father   . Hypertension Father   . Hyperlipidemia Father   . Other Father     MVA  . Hypertension Brother   . Cancer Brother   . Heart disease Brother   . Cancer Paternal Grandmother     stomach  . Diabetes Paternal Grandfather    Social History:  reports that she has never smoked. She has never used smokeless tobacco. She   reports that she drinks alcohol. She reports that she does not use illicit drugs.  Allergies:  Allergies  Allergen Reactions  . Niacin And Related Other (See Comments)    Whelps and skin flushed, mouth tingling  . Tolectin [Tolmetin] Rash     (Not in a hospital admission)  No results found for this or any previous visit (from the past 48 hour(s)). No results found.  Review of Systems  Constitutional: Negative.   HENT: Negative.   Eyes: Negative.   Respiratory: Negative.   Cardiovascular: Negative.   Gastrointestinal: Negative.   Genitourinary: Negative.   Musculoskeletal: Positive for back pain.  Skin: Negative.   Neurological: Positive for sensory change and focal weakness.  Psychiatric/Behavioral: Negative.     There were no vitals taken for this  visit. Physical Exam  Constitutional: She is oriented to person, place, and time. She appears well-developed and well-nourished.  HENT:  Head: Normocephalic and atraumatic.  Eyes: Pupils are equal, round, and reactive to light.  Neck: Normal range of motion.  Cardiovascular: Normal rate.   Respiratory: Effort normal.  GI: Soft.  Musculoskeletal:  On exam, mild distress. Walks with an antalgic gait. Mood and affect is appropriate. She is also reporting occasionally to still have the problems with the ankle. They will give out on her on the left. She has a ruptured plantar fascia, seen by [name]. Did not recommend surgery. She also has apparently a coalition.  She has pain. Straight leg raise is positive. She has EHL 4+/5 left compared to the right. Dynamic pes planus is noted. Altered sensation in the L5 dermatome.  Neurological: She is alert and oriented to person, place, and time. She has normal reflexes.  Skin: Skin is warm and dry.    MRI demonstrates moderate disc herniation at 4-5, the left is displacing the 5 root.  Assessment/Plan 1. L5 radiculopathy is secondary to disc herniation, myotomal weakness, dermatomal dysesthesias. 2. Non-insulin dependent diabetes. 3. Coronary artery disease, on Plavix. She has gotten her clearance from her cardiologist, mild risk. 4. Instability secondary to dynamic pes planus and the plantar fascia rupture.  Discussed with [name] JacobsonHelen her options at this point in time. Given the persistent neural tension signs and EHL weakness, dorsiflexion weakness, I think it wise to consider decompression to allow recovery of the L5 nerve root. She does have an intrinsic instability in the foot secondary to plantar fascial rupture that is compounded by her L5 radiculopathy and myotomal weakness. In the interim, I will place her on an ASO to help support her ankle and avoid rolling it over and we will proceed with lumbar decompressions. I had an extensive discussion  of the risks and benefits of the lumbar decompression with the patient including bleeding, infection, damage to neurovascular structures, epidural fibrosis, CSF leak requiring repair. We also discussed increase in pain, adjacent segment disease, recurrent disc herniation, need for future surgery including repeat decompression and/or fusion. We also discussed risks of postoperative hematoma, paralysis, anesthetic complications including DVT, PE, death, cardiopulmonary dysfunction. In addition, the perioperative and postoperative courses were discussed in detail including the rehabilitative time and return to functional activity and work. I provided the patient with an illustrated handout and utilized the appropriate surgical models. She is asked about delaying that. I would expedite decompression to augment her chances for recovery.  Plan revision microlumbar decompression L4-5 left, possible L3-4  BISSELL, JACLYN M. PA-C for [name]. Shelle IronBeane 10/10/2014, 8:22 AM

## 2014-10-12 ENCOUNTER — Encounter (HOSPITAL_COMMUNITY)
Admission: RE | Disposition: A | Discharge status: Home / Self Care | Payer: Self-pay | Source: Ambulatory Visit | Attending: Specialist

## 2014-10-12 ENCOUNTER — Ambulatory Visit (HOSPITAL_COMMUNITY): Payer: PPO

## 2014-10-12 ENCOUNTER — Encounter (HOSPITAL_COMMUNITY): Payer: Self-pay | Admitting: *Deleted

## 2014-10-12 ENCOUNTER — Ambulatory Visit (HOSPITAL_COMMUNITY)
Admission: RE | Admit: 2014-10-12 | Discharge: 2014-10-13 | Disposition: A | Discharge status: Home / Self Care | Payer: PPO | Source: Ambulatory Visit | Attending: Specialist | Admitting: Specialist

## 2014-10-12 ENCOUNTER — Ambulatory Visit (HOSPITAL_COMMUNITY): Payer: PPO | Admitting: Anesthesiology

## 2014-10-12 DIAGNOSIS — K219 Gastro-esophageal reflux disease without esophagitis: Secondary | ICD-10-CM | POA: Diagnosis not present

## 2014-10-12 DIAGNOSIS — R52 Pain, unspecified: Secondary | ICD-10-CM

## 2014-10-12 DIAGNOSIS — M2142 Flat foot [pes planus] (acquired), left foot: Secondary | ICD-10-CM | POA: Insufficient documentation

## 2014-10-12 DIAGNOSIS — G4730 Sleep apnea, unspecified: Secondary | ICD-10-CM | POA: Diagnosis not present

## 2014-10-12 DIAGNOSIS — M5116 Intervertebral disc disorders with radiculopathy, lumbar region: Secondary | ICD-10-CM | POA: Insufficient documentation

## 2014-10-12 DIAGNOSIS — M4806 Spinal stenosis, lumbar region: Secondary | ICD-10-CM | POA: Insufficient documentation

## 2014-10-12 DIAGNOSIS — E119 Type 2 diabetes mellitus without complications: Secondary | ICD-10-CM | POA: Diagnosis not present

## 2014-10-12 DIAGNOSIS — I10 Essential (primary) hypertension: Secondary | ICD-10-CM | POA: Insufficient documentation

## 2014-10-12 DIAGNOSIS — E785 Hyperlipidemia, unspecified: Secondary | ICD-10-CM | POA: Diagnosis not present

## 2014-10-12 DIAGNOSIS — R200 Anesthesia of skin: Secondary | ICD-10-CM | POA: Diagnosis not present

## 2014-10-12 DIAGNOSIS — I2510 Atherosclerotic heart disease of native coronary artery without angina pectoris: Secondary | ICD-10-CM | POA: Insufficient documentation

## 2014-10-12 DIAGNOSIS — D649 Anemia, unspecified: Secondary | ICD-10-CM | POA: Diagnosis not present

## 2014-10-12 DIAGNOSIS — H269 Unspecified cataract: Secondary | ICD-10-CM | POA: Insufficient documentation

## 2014-10-12 DIAGNOSIS — Z888 Allergy status to other drugs, medicaments and biological substances status: Secondary | ICD-10-CM | POA: Diagnosis not present

## 2014-10-12 DIAGNOSIS — M48061 Spinal stenosis, lumbar region without neurogenic claudication: Secondary | ICD-10-CM | POA: Diagnosis present

## 2014-10-12 DIAGNOSIS — M5126 Other intervertebral disc displacement, lumbar region: Secondary | ICD-10-CM

## 2014-10-12 HISTORY — PX: LUMBAR LAMINECTOMY/DECOMPRESSION MICRODISCECTOMY: SHX5026

## 2014-10-12 LAB — GLUCOSE, CAPILLARY
GLUCOSE-CAPILLARY: 136 mg/dL — AB (ref 65–99)
GLUCOSE-CAPILLARY: 204 mg/dL — AB (ref 65–99)
GLUCOSE-CAPILLARY: 191 mg/dL — AB (ref 65–99)
Glucose-Capillary: 129 mg/dL — ABNORMAL HIGH (ref 65–99)

## 2014-10-12 SURGERY — LUMBAR LAMINECTOMY/DECOMPRESSION MICRODISCECTOMY 1 LEVEL
Anesthesia: General | Site: Back | Laterality: Left

## 2014-10-12 MED ORDER — DOCUSATE SODIUM 100 MG PO CAPS: 100.0000 mg | ORAL_CAPSULE | Freq: Two times a day (BID) | ORAL | Status: DC | PRN

## 2014-10-12 MED ORDER — OXYCODONE-ACETAMINOPHEN 5-325 MG PO TABS
1.0000 | ORAL_TABLET | ORAL | Status: DC | PRN
  Administered 2014-10-12 – 2014-10-13 (×3): 1 via ORAL
  Filled 2014-10-12 (×3): qty 1

## 2014-10-12 MED ORDER — ALUM & MAG HYDROXIDE-SIMETH 200-200-20 MG/5ML PO SUSP: 30.0000 mL | Freq: Four times a day (QID) | ORAL | Status: DC | PRN

## 2014-10-12 MED ORDER — MAGNESIUM CITRATE PO SOLN: 1.0000 | Freq: Once | ORAL | Status: AC | PRN

## 2014-10-12 MED ORDER — PHENOL 1.4 % MT LIQD: 1.0000 | OROMUCOSAL | Status: DC | PRN

## 2014-10-12 MED ORDER — POTASSIUM CHLORIDE IN NACL 20-0.9 MEQ/L-% IV SOLN
INTRAVENOUS | Status: DC
  Administered 2014-10-12: 18:00:00 via INTRAVENOUS
  Filled 2014-10-12 (×2): qty 1000

## 2014-10-12 MED ORDER — GLYCOPYRROLATE 0.2 MG/ML IJ SOLN
INTRAMUSCULAR | Status: DC | PRN
  Administered 2014-10-12: 0.6 mg via INTRAVENOUS

## 2014-10-12 MED ORDER — FENTANYL CITRATE (PF) 100 MCG/2ML IJ SOLN
INTRAMUSCULAR | Status: DC | PRN
  Administered 2014-10-12: 100 ug via INTRAVENOUS

## 2014-10-12 MED ORDER — PROPOFOL 10 MG/ML IV BOLUS
INTRAVENOUS | Status: DC | PRN
  Administered 2014-10-12: 150 mg via INTRAVENOUS

## 2014-10-12 MED ORDER — METHOCARBAMOL 500 MG PO TABS: 500.0000 mg | ORAL_TABLET | Freq: Four times a day (QID) | ORAL | Status: DC | PRN

## 2014-10-12 MED ORDER — IRBESARTAN 75 MG PO TABS
37.5000 mg | ORAL_TABLET | Freq: Every day | ORAL | Status: DC
  Administered 2014-10-13: 37.5 mg via ORAL
  Filled 2014-10-12 (×2): qty 0.5

## 2014-10-12 MED ORDER — DEXAMETHASONE SODIUM PHOSPHATE 10 MG/ML IJ SOLN
INTRAMUSCULAR | Status: AC
  Filled 2014-10-12: qty 1

## 2014-10-12 MED ORDER — METHOCARBAMOL 500 MG PO TABS: 500.0000 mg | ORAL_TABLET | Freq: Three times a day (TID) | ORAL | Status: DC | PRN

## 2014-10-12 MED ORDER — CEFAZOLIN SODIUM-DEXTROSE 2-3 GM-% IV SOLR
2.0000 g | INTRAVENOUS | Status: AC
  Administered 2014-10-12: 2 g via INTRAVENOUS

## 2014-10-12 MED ORDER — RISAQUAD PO CAPS
1.0000 | ORAL_CAPSULE | Freq: Every day | ORAL | Status: DC
Start: 2014-10-12 — End: 2014-10-13
  Administered 2014-10-12 – 2014-10-13 (×2): 1 via ORAL
  Filled 2014-10-12 (×2): qty 1

## 2014-10-12 MED ORDER — BUPIVACAINE-EPINEPHRINE (PF) 0.5% -1:200000 IJ SOLN
INTRAMUSCULAR | Status: AC
  Filled 2014-10-12: qty 30

## 2014-10-12 MED ORDER — THROMBIN 5000 UNITS EX SOLR
OROMUCOSAL | Status: DC | PRN
  Administered 2014-10-12: 10000 mL via TOPICAL

## 2014-10-12 MED ORDER — NEOSTIGMINE METHYLSULFATE 10 MG/10ML IV SOLN
INTRAVENOUS | Status: DC | PRN
  Administered 2014-10-12: 5 mg via INTRAVENOUS

## 2014-10-12 MED ORDER — GLYCOPYRROLATE 0.2 MG/ML IJ SOLN
INTRAMUSCULAR | Status: AC
  Filled 2014-10-12: qty 3

## 2014-10-12 MED ORDER — LIDOCAINE HCL (CARDIAC) 20 MG/ML IV SOLN
INTRAVENOUS | Status: DC | PRN
  Administered 2014-10-12: 75 mg via INTRAVENOUS

## 2014-10-12 MED ORDER — ACETAMINOPHEN 325 MG PO TABS: 650.0000 mg | ORAL_TABLET | ORAL | Status: DC | PRN

## 2014-10-12 MED ORDER — SENNOSIDES-DOCUSATE SODIUM 8.6-50 MG PO TABS: 1.0000 | ORAL_TABLET | Freq: Every evening | ORAL | Status: DC | PRN

## 2014-10-12 MED ORDER — CEFAZOLIN SODIUM-DEXTROSE 2-3 GM-% IV SOLR
2.0000 g | Freq: Three times a day (TID) | INTRAVENOUS | Status: DC
  Administered 2014-10-12 – 2014-10-13 (×2): 2 g via INTRAVENOUS
  Filled 2014-10-12 (×3): qty 50

## 2014-10-12 MED ORDER — CLINDAMYCIN PHOSPHATE 900 MG/50ML IV SOLN
INTRAVENOUS | Status: AC
  Filled 2014-10-12: qty 50

## 2014-10-12 MED ORDER — SUCCINYLCHOLINE CHLORIDE 20 MG/ML IJ SOLN
INTRAMUSCULAR | Status: DC | PRN
  Administered 2014-10-12: 100 mg via INTRAVENOUS

## 2014-10-12 MED ORDER — CLINDAMYCIN PHOSPHATE 900 MG/50ML IV SOLN
900.0000 mg | INTRAVENOUS | Status: AC
  Administered 2014-10-12: 900 mg via INTRAVENOUS

## 2014-10-12 MED ORDER — METHOCARBAMOL 1000 MG/10ML IJ SOLN
500.0000 mg | Freq: Four times a day (QID) | INTRAMUSCULAR | Status: DC | PRN
  Administered 2014-10-12: 500 mg via INTRAVENOUS
  Filled 2014-10-12 (×2): qty 5

## 2014-10-12 MED ORDER — TRAMADOL HCL 50 MG PO TABS: 50.0000 mg | ORAL_TABLET | Freq: Four times a day (QID) | ORAL | Status: DC | PRN

## 2014-10-12 MED ORDER — LIDOCAINE HCL (CARDIAC) 20 MG/ML IV SOLN
INTRAVENOUS | Status: AC
  Filled 2014-10-12: qty 5

## 2014-10-12 MED ORDER — ACETAMINOPHEN 650 MG RE SUPP: 650.0000 mg | RECTAL | Status: DC | PRN

## 2014-10-12 MED ORDER — FLUTICASONE PROPIONATE 50 MCG/ACT NA SUSP
2.0000 | Freq: Every day | NASAL | Status: DC | PRN
  Filled 2014-10-12: qty 16

## 2014-10-12 MED ORDER — ROCURONIUM BROMIDE 100 MG/10ML IV SOLN
INTRAVENOUS | Status: AC
  Filled 2014-10-12: qty 1

## 2014-10-12 MED ORDER — METOPROLOL SUCCINATE 12.5 MG HALF TABLET
12.5000 mg | ORAL_TABLET | Freq: Every morning | ORAL | Status: DC
  Filled 2014-10-12: qty 1

## 2014-10-12 MED ORDER — THROMBIN 5000 UNITS EX SOLR
CUTANEOUS | Status: AC
  Filled 2014-10-12: qty 10000

## 2014-10-12 MED ORDER — DIAZEPAM 2 MG PO TABS: 2.0000 mg | ORAL_TABLET | Freq: Three times a day (TID) | ORAL | Status: DC | PRN

## 2014-10-12 MED ORDER — POTASSIUM CHLORIDE CRYS ER 20 MEQ PO TBCR
20.0000 meq | EXTENDED_RELEASE_TABLET | Freq: Every day | ORAL | Status: DC
  Administered 2014-10-12 – 2014-10-13 (×2): 20 meq via ORAL
  Filled 2014-10-12 (×2): qty 1

## 2014-10-12 MED ORDER — ONDANSETRON HCL 4 MG/2ML IJ SOLN
4.0000 mg | INTRAMUSCULAR | Status: DC | PRN
  Administered 2014-10-12: 4 mg via INTRAVENOUS
  Filled 2014-10-12: qty 2

## 2014-10-12 MED ORDER — ONDANSETRON HCL 4 MG/2ML IJ SOLN
INTRAMUSCULAR | Status: AC
  Filled 2014-10-12: qty 2

## 2014-10-12 MED ORDER — SODIUM CHLORIDE 0.9 % IR SOLN
Status: AC
  Filled 2014-10-12: qty 1

## 2014-10-12 MED ORDER — HYDROMORPHONE HCL 1 MG/ML IJ SOLN
0.2500 mg | INTRAMUSCULAR | Status: DC | PRN
  Administered 2014-10-12 (×4): 0.5 mg via INTRAVENOUS

## 2014-10-12 MED ORDER — LACTATED RINGERS IV SOLN
INTRAVENOUS | Status: DC
  Administered 2014-10-12: 1000 mL via INTRAVENOUS

## 2014-10-12 MED ORDER — FENTANYL CITRATE (PF) 250 MCG/5ML IJ SOLN
INTRAMUSCULAR | Status: AC
  Filled 2014-10-12: qty 5

## 2014-10-12 MED ORDER — HYDROMORPHONE HCL 1 MG/ML IJ SOLN
INTRAMUSCULAR | Status: AC
  Filled 2014-10-12: qty 1

## 2014-10-12 MED ORDER — MIDAZOLAM HCL 5 MG/5ML IJ SOLN
INTRAMUSCULAR | Status: DC | PRN
  Administered 2014-10-12: 2 mg via INTRAVENOUS

## 2014-10-12 MED ORDER — MENTHOL 3 MG MT LOZG: 1.0000 | LOZENGE | OROMUCOSAL | Status: DC | PRN

## 2014-10-12 MED ORDER — PROPOFOL 10 MG/ML IV BOLUS
INTRAVENOUS | Status: AC
  Filled 2014-10-12: qty 20

## 2014-10-12 MED ORDER — NEOSTIGMINE METHYLSULFATE 10 MG/10ML IV SOLN
INTRAVENOUS | Status: AC
  Filled 2014-10-12: qty 1

## 2014-10-12 MED ORDER — OXYCODONE-ACETAMINOPHEN 5-325 MG PO TABS: 1.0000 | ORAL_TABLET | ORAL | Status: DC | PRN

## 2014-10-12 MED ORDER — LACTATED RINGERS IV SOLN: INTRAVENOUS | Status: DC

## 2014-10-12 MED ORDER — HYDROMORPHONE HCL 1 MG/ML IJ SOLN: 0.5000 mg | INTRAMUSCULAR | Status: DC | PRN

## 2014-10-12 MED ORDER — INSULIN ASPART 100 UNIT/ML ~~LOC~~ SOLN
0.0000 [IU] | Freq: Three times a day (TID) | SUBCUTANEOUS | Status: DC
  Administered 2014-10-12: 5 [IU] via SUBCUTANEOUS
  Administered 2014-10-13: 2 [IU] via SUBCUTANEOUS

## 2014-10-12 MED ORDER — BUPIVACAINE-EPINEPHRINE 0.5% -1:200000 IJ SOLN
INTRAMUSCULAR | Status: DC | PRN
  Administered 2014-10-12: 15 mL

## 2014-10-12 MED ORDER — HYDROCHLOROTHIAZIDE 25 MG PO TABS
25.0000 mg | ORAL_TABLET | Freq: Every day | ORAL | Status: DC
  Administered 2014-10-13: 25 mg via ORAL
  Filled 2014-10-12 (×2): qty 1

## 2014-10-12 MED ORDER — DOCUSATE SODIUM 100 MG PO CAPS
100.0000 mg | ORAL_CAPSULE | Freq: Two times a day (BID) | ORAL | Status: DC
  Administered 2014-10-12 – 2014-10-13 (×2): 100 mg via ORAL

## 2014-10-12 MED ORDER — SODIUM CHLORIDE 0.9 % IR SOLN
Status: DC | PRN
  Administered 2014-10-12: 500 mL

## 2014-10-12 MED ORDER — MIDAZOLAM HCL 2 MG/2ML IJ SOLN
INTRAMUSCULAR | Status: AC
  Filled 2014-10-12: qty 2

## 2014-10-12 MED ORDER — HYDROCODONE-ACETAMINOPHEN 5-325 MG PO TABS
1.0000 | ORAL_TABLET | ORAL | Status: DC | PRN
  Administered 2014-10-13: 1 via ORAL
  Filled 2014-10-12: qty 1

## 2014-10-12 MED ORDER — FAMOTIDINE 20 MG PO TABS
20.0000 mg | ORAL_TABLET | Freq: Two times a day (BID) | ORAL | Status: DC
  Administered 2014-10-12 – 2014-10-13 (×2): 20 mg via ORAL
  Filled 2014-10-12 (×3): qty 1

## 2014-10-12 MED ORDER — BISACODYL 5 MG PO TBEC: 5.0000 mg | DELAYED_RELEASE_TABLET | Freq: Every day | ORAL | Status: DC | PRN

## 2014-10-12 MED ORDER — LORATADINE 10 MG PO TABS
10.0000 mg | ORAL_TABLET | Freq: Every day | ORAL | Status: DC
  Administered 2014-10-13: 10 mg via ORAL
  Filled 2014-10-12 (×2): qty 1

## 2014-10-12 MED ORDER — CEFAZOLIN SODIUM-DEXTROSE 2-3 GM-% IV SOLR
INTRAVENOUS | Status: AC
  Filled 2014-10-12: qty 50

## 2014-10-12 MED ORDER — ROCURONIUM BROMIDE 100 MG/10ML IV SOLN
INTRAVENOUS | Status: DC | PRN
  Administered 2014-10-12 (×3): 10 mg via INTRAVENOUS
  Administered 2014-10-12: 30 mg via INTRAVENOUS

## 2014-10-12 SURGICAL SUPPLY — 48 items
BAG SPEC THK2 15X12 ZIP CLS (MISCELLANEOUS)
BAG ZIPLOCK 12X15 (MISCELLANEOUS) IMPLANT
CLEANER TIP ELECTROSURG 2X2 (MISCELLANEOUS) ×2 IMPLANT
CLOTH 2% CHLOROHEXIDINE 3PK (PERSONAL CARE ITEMS) ×2 IMPLANT
DRAPE MICROSCOPE LEICA (MISCELLANEOUS) ×2 IMPLANT
DRAPE POUCH INSTRU U-SHP 10X18 (DRAPES) ×2 IMPLANT
DRAPE SHEET LG 3/4 BI-LAMINATE (DRAPES) ×2 IMPLANT
DRAPE SURG 17X11 SM STRL (DRAPES) ×2 IMPLANT
DRAPE UTILITY XL STRL (DRAPES) ×2 IMPLANT
DRSG AQUACEL AG ADV 3.5X 4 (GAUZE/BANDAGES/DRESSINGS) IMPLANT
DRSG AQUACEL AG ADV 3.5X 6 (GAUZE/BANDAGES/DRESSINGS) IMPLANT
DRSG MEPILEX BORDER 4X4 (GAUZE/BANDAGES/DRESSINGS) ×1 IMPLANT
DURAPREP 26ML APPLICATOR (WOUND CARE) ×2 IMPLANT
DURASEAL SPINE SEALANT 3ML (MISCELLANEOUS) IMPLANT
ELECT BLADE TIP CTD 4 INCH (ELECTRODE) ×1 IMPLANT
ELECT REM PT RETURN 9FT ADLT (ELECTROSURGICAL) ×2
ELECTRODE REM PT RTRN 9FT ADLT (ELECTROSURGICAL) ×1 IMPLANT
GLOVE BIOGEL PI IND STRL 7.5 (GLOVE) ×1 IMPLANT
GLOVE BIOGEL PI INDICATOR 7.5 (GLOVE) ×1
GLOVE SURG SS PI 7.5 STRL IVOR (GLOVE) ×2 IMPLANT
GLOVE SURG SS PI 8.0 STRL IVOR (GLOVE) ×4 IMPLANT
GOWN STRL REUS W/TWL XL LVL3 (GOWN DISPOSABLE) ×4 IMPLANT
IV CATH AUTO 14GX1.75 SAFE ORG (IV SOLUTION) ×2 IMPLANT
KIT BASIN OR (CUSTOM PROCEDURE TRAY) ×2 IMPLANT
KIT POSITIONING SURG ANDREWS (MISCELLANEOUS) ×2 IMPLANT
MANIFOLD NEPTUNE II (INSTRUMENTS) ×2 IMPLANT
NDL SPNL 18GX3.5 QUINCKE PK (NEEDLE) ×2 IMPLANT
NEEDLE SPNL 18GX3.5 QUINCKE PK (NEEDLE) ×4 IMPLANT
PACK LAMINECTOMY ORTHO (CUSTOM PROCEDURE TRAY) ×2 IMPLANT
PATTIES SURGICAL .5 X.5 (GAUZE/BANDAGES/DRESSINGS) IMPLANT
PATTIES SURGICAL .75X.75 (GAUZE/BANDAGES/DRESSINGS) IMPLANT
PATTIES SURGICAL 1X1 (DISPOSABLE) IMPLANT
SPONGE SURGIFOAM ABS GEL 100 (HEMOSTASIS) ×2 IMPLANT
STAPLER VISISTAT (STAPLE) IMPLANT
STRIP CLOSURE SKIN 1/2X4 (GAUZE/BANDAGES/DRESSINGS) ×2 IMPLANT
SUT NURALON 4 0 TR CR/8 (SUTURE) IMPLANT
SUT PROLENE 3 0 PS 2 (SUTURE) ×2 IMPLANT
SUT VIC AB 1 CT1 27 (SUTURE)
SUT VIC AB 1 CT1 27XBRD ANTBC (SUTURE) IMPLANT
SUT VIC AB 1-0 CT2 27 (SUTURE) ×2 IMPLANT
SUT VIC AB 2-0 CT1 27 (SUTURE)
SUT VIC AB 2-0 CT1 TAPERPNT 27 (SUTURE) IMPLANT
SUT VIC AB 2-0 CT2 27 (SUTURE) ×2 IMPLANT
SYR 3ML LL SCALE MARK (SYRINGE) ×1 IMPLANT
TOWEL OR 17X26 10 PK STRL BLUE (TOWEL DISPOSABLE) ×2 IMPLANT
TOWEL OR NON WOVEN STRL DISP B (DISPOSABLE) ×1 IMPLANT
TRAY FOLEY W/METER SILVER 14FR (SET/KITS/TRAYS/PACK) ×1 IMPLANT
YANKAUER SUCT BULB TIP NO VENT (SUCTIONS) IMPLANT

## 2014-10-12 NOTE — Anesthesia Procedure Notes (Signed)
Procedure Name: Intubation Date/Time: 10/12/2014 11:07 AM Performed by: Elyn PeersALLEN, Jaamal Farooqui J Pre-anesthesia Checklist: Patient identified, Emergency Drugs available, Suction available, Patient being monitored and Timeout performed Patient Re-evaluated:Patient Re-evaluated prior to inductionOxygen Delivery Method: Circle system utilized Preoxygenation: Pre-oxygenation with 100% oxygen Intubation Type: IV induction Ventilation: Mask ventilation without difficulty Laryngoscope Size: Miller and 3 Grade View: Grade II Tube type: Oral Tube size: 7.0 mm Number of attempts: 1 Airway Equipment and Method: Stylet Placement Confirmation: ETT inserted through vocal cords under direct vision,  positive ETCO2 and breath sounds checked- equal and bilateral Secured at: 21 cm Tube secured with: Tape Dental Injury: Teeth and Oropharynx as per pre-operative assessment

## 2014-10-12 NOTE — Progress Notes (Signed)
Spoke with pt regarding cpap.  Pt stated she is feeling too nauseated to wear cpap tonight.  Pt was advised that RT is available all night should she feel better and want to try it later tonight.  RN notified.

## 2014-10-12 NOTE — Brief Op Note (Signed)
10/12/2014  12:35 PM  PATIENT:  Leanna SatoHelen J Foushee  64 y.o. female  PRE-OPERATIVE DIAGNOSIS:  RECURRENT HNP  POST-OPERATIVE DIAGNOSIS:  RECURRENT HNP  PROCEDURE:  Procedure(s): REVISION MICRO LUMBAR/DECOMPRESSION L4-5 LEFT  (Left)  SURGEON:  Surgeon(s) and Role:    * Jene EveryJeffrey Daiden Coltrane, MD - Primary  PHYSICIAN ASSISTANT:   ASSISTANTS: [place]   ANESTHESIA:   GET  EBL:  Total I/O In: -  Out: 330 [Urine:300; Blood:30]  BLOOD ADMINISTERED:none  DRAINS: none   LOCAL MEDICATIONS USED:  MARCAINE     SPECIMEN:  No Specimen  DISPOSITION OF SPECIMEN:  N/A  COUNTS:  YES  TOURNIQUET:  * No tourniquets in log *  DICTATION: .Other Dictation: Dictation Number [id number]  PLAN OF CARE: Admit for overnight observation  PATIENT DISPOSITION:  PACU - hemodynamically stable.   Delay start of Pharmacological VTE agent (>24hrs) due to surgical blood loss or risk of bleeding: yes

## 2014-10-12 NOTE — Anesthesia Postprocedure Evaluation (Signed)
  Anesthesia Post-op Note  Patient: [name] SatoHelen J Simpson  Procedure(s) Performed: Procedure(s) (LRB): REVISION MICRO LUMBAR/DECOMPRESSION L4-5 LEFT  (Left)  Patient Location: PACU  Anesthesia Type: General  Level of Consciousness: awake and alert   Airway and Oxygen Therapy: Patient Spontanous Breathing  Post-op Pain: mild  Post-op Assessment: Post-op Vital signs reviewed, Patient's Cardiovascular Status Stable, Respiratory Function Stable, Patent Airway and No signs of Nausea or vomiting  Last Vitals:  Filed Vitals:   10/12/14 1315  BP: 111/40  Pulse: 52  Temp:   Resp: 18    Post-op Vital Signs: stable   Complications: No apparent anesthesia complications

## 2014-10-12 NOTE — H&P (View-Only) (Signed)
[name] is an 64 y.o. female.   Chief Complaint: back and left leg pain, weakness HPI: The patient is a 64 year old female who presents today for follow up of their back. The patient is being followed for their low back symptoms. They are now 2 1/2 months out from a flare up. The patient is 20 1/2 months out from srugery. Symptoms reported today include: pain. Current treatment includes: physical therapy, home exercise program, activity modification, pain medications and muscle relaxer and the use of a cane. The following medication has been used for pain control: Tylenol and Robaxin. The patient presents today following physical therapy.  She still has some burning in the legs. She is doing physical therapy. She is discussing whether she wants to proceed, discussed surgery. She still has the weakness.  Past Medical History  Diagnosis Date  . Anemia   . Diabetes mellitus without complication   . Hypertension   . Hyperlipidemia LDL goal < 70   . CAD (coronary artery disease)     PCI to LAD in 2010, mild residual disease in LCX and RCA  . GERD (gastroesophageal reflux disease)   . Plantar fascia rupture     Left Foot  . Lumbar back pain   . Family history of adverse reaction to anesthesia     pts mother had difficulty awakening   . Anginal pain     pt relates to stress pt was seen by Dr Rennis Golden 3 wks ago was given metoprolol   . Sleep apnea     uses CPAP   . Bronchitis     hx of   . Numbness     left leg and foot   . Cataracts, bilateral     Past Surgical History  Procedure Laterality Date  . Tympanoplasty Bilateral   . Carotid stent  2009    pt denies   . Doppler echocardiography  08/01/2009    EF=>55%,LV normal  . Nm myocar perf wall motion  09/22/2008    lexiscan-EF 83%; glogal LV systolic fx is norm. ,evidence of mild ischemia basal anterior,midanterior and apical lateral region(s).   . Lower arterial duplex  06/20/10    abi's normal,rgt 0.98,lft 1.06;bilateral PVRs  normal  . Abdominal hysterectomy    . Coronary angioplasty with stent placement  2010and 10-02-2012    Stent DES, Xience to prox. LAD  . Lumbar laminectomy/decompression microdiscectomy Left 01/06/2013    Procedure: MICRO LUMBAR DECOMPRESSION L4-5 AND L5-S1;  Surgeon: Javier Docker, MD;  Location: [facility];  Service: Orthopedics;  Laterality: Left;  . Left heart catheterization with coronary angiogram N/A 10/02/2012    Procedure: LEFT HEART CATHETERIZATION WITH CORONARY ANGIOGRAM;  Surgeon: Runell Gess, MD;  Location: [facility] CATH LAB;  Service: Cardiovascular;  Laterality: N/A;  . Uvulopalatopharyngoplasty      pt denies   . Tubal ligation      Family History  Problem Relation Age of Onset  . Coronary artery disease Mother   . Rheum arthritis Mother   . Dementia Mother   . Heart attack Father   . Hypertension Father   . Hyperlipidemia Father   . Other Father     MVA  . Hypertension Brother   . Cancer Brother   . Heart disease Brother   . Cancer Paternal Grandmother     stomach  . Diabetes Paternal Grandfather    Social History:  reports that she has never smoked. She has never used smokeless tobacco. She  reports that she drinks alcohol. She reports that she does not use illicit drugs.  Allergies:  Allergies  Allergen Reactions  . Niacin And Related Other (See Comments)    Whelps and skin flushed, mouth tingling  . Tolectin [Tolmetin] Rash     (Not in a hospital admission)  No results found for this or any previous visit (from the past 48 hour(s)). No results found.  Review of Systems  Constitutional: Negative.   HENT: Negative.   Eyes: Negative.   Respiratory: Negative.   Cardiovascular: Negative.   Gastrointestinal: Negative.   Genitourinary: Negative.   Musculoskeletal: Positive for back pain.  Skin: Negative.   Neurological: Positive for sensory change and focal weakness.  Psychiatric/Behavioral: Negative.     There were no vitals taken for this  visit. Physical Exam  Constitutional: She is oriented to person, place, and time. She appears well-developed and well-nourished.  HENT:  Head: Normocephalic and atraumatic.  Eyes: Pupils are equal, round, and reactive to light.  Neck: Normal range of motion.  Cardiovascular: Normal rate.   Respiratory: Effort normal.  GI: Soft.  Musculoskeletal:  On exam, mild distress. Walks with an antalgic gait. Mood and affect is appropriate. She is also reporting occasionally to still have the problems with the ankle. They will give out on her on the left. She has a ruptured plantar fascia, seen by Dr. Victorino DikeHewitt. Did not recommend surgery. She also has apparently a coalition.  She has pain. Straight leg raise is positive. She has EHL 4+/5 left compared to the right. Dynamic pes planus is noted. Altered sensation in the L5 dermatome.  Neurological: She is alert and oriented to person, place, and time. She has normal reflexes.  Skin: Skin is warm and dry.    MRI demonstrates moderate disc herniation at 4-5, the left is displacing the 5 root.  Assessment/Plan 1. L5 radiculopathy is secondary to disc herniation, myotomal weakness, dermatomal dysesthesias. 2. Non-insulin dependent diabetes. 3. Coronary artery disease, on Plavix. She has gotten her clearance from her cardiologist, mild risk. 4. Instability secondary to dynamic pes planus and the plantar fascia rupture.  Discussed with Myriam JacobsonHelen her options at this point in time. Given the persistent neural tension signs and EHL weakness, dorsiflexion weakness, I think it wise to consider decompression to allow recovery of the L5 nerve root. She does have an intrinsic instability in the foot secondary to plantar fascial rupture that is compounded by her L5 radiculopathy and myotomal weakness. In the interim, I will place her on an ASO to help support her ankle and avoid rolling it over and we will proceed with lumbar decompressions. I had an extensive discussion  of the risks and benefits of the lumbar decompression with the patient including bleeding, infection, damage to neurovascular structures, epidural fibrosis, CSF leak requiring repair. We also discussed increase in pain, adjacent segment disease, recurrent disc herniation, need for future surgery including repeat decompression and/or fusion. We also discussed risks of postoperative hematoma, paralysis, anesthetic complications including DVT, PE, death, cardiopulmonary dysfunction. In addition, the perioperative and postoperative courses were discussed in detail including the rehabilitative time and return to functional activity and work. I provided the patient with an illustrated handout and utilized the appropriate surgical models. She is asked about delaying that. I would expedite decompression to augment her chances for recovery.  Plan revision microlumbar decompression L4-5 left, possible L3-4  Tommy Minichiello M. PA-C for Dr. Shelle IronBeane 10/10/2014, 8:22 AM

## 2014-10-12 NOTE — Interval H&P Note (Signed)
History and Physical Interval Note:  10/12/2014 8:24 AM  [name] SatoHelen J Simpson  has presented today for surgery, with the diagnosis of RECURRENT HNP  The various methods of treatment have been discussed with the patient and family. After consideration of risks, benefits and other options for treatment, the patient has consented to  Procedure(s): REVISION MICRO LUMBAR/DECOMPRESSION L4-5 LEFT POSSIBLE L3-4 (Left) as a surgical intervention .  The patient's history has been reviewed, patient examined, no change in status, stable for surgery.  I have reviewed the patient's chart and labs.  Questions were answered to the patient's satisfaction.     Hikaru Delorenzo C

## 2014-10-12 NOTE — Plan of Care (Signed)
Problem: Consults Goal: Diagnosis - Spinal Surgery Lumbar Laminectomy (Complex)     

## 2014-10-12 NOTE — Discharge Instructions (Signed)

## 2014-10-12 NOTE — Op Note (Signed)
NAMLanora Manis:  Mensinger, Charlett                  ACCOUNT NO.:  [id number]  MEDICAL RECORD NO.:  [id number]  LOCATION:  1603                         [facility]  PHYSICIAN:  Jene EveryJeffrey Darris Carachure, M.D.    DATE OF BIRTH:  Feb 08, 1951  DATE OF PROCEDURE:  10/12/2014 DATE OF DISCHARGE:                              OPERATIVE REPORT   PREOPERATIVE DIAGNOSES:  Spinal stenosis, recurrent disk herniation at L4-5, left.  POSTOPERATIVE DIAGNOSES:  Spinal stenosis, recurrent disk herniation at L4-5, left.  PROCEDURES PERFORMED: 1. Revision microdiskectomy at L4-5, left. 2. Foraminotomies of L4 and L5, left.  ANESTHESIA:  General.  ASSISTANT:  Lanna PocheJacqueline Bissell, P.A.  HISTORY:  A 64 year old, recurrent L5 radiculopathy.  MRI indicating recurrent disk herniation at 4-5 and stenosis at 4-5, the foramen of 4, displaced in the 5 root, disk degeneration at 4-5.  Predominant leg pain as well as the back pain, failing conservative treatment.  She was indicated for decompressing the 5 and the 4 roots, redo lumbar decompression.  Risks and benefits were discussed including bleeding, infection, damage to neurovascular structure, DVT, PE, anesthetic complications, no change in symptoms or worsening of symptoms, etc.  TECHNIQUE:  With the patient in supine position, after induction of adequate general anesthesia, 2 g of Kefzol, 900 mg clindamycin, she was placed prone on the [place] frame.  All bony prominences were well padded.  Lumbar region was prepped and draped in usual sterile fashion. Two 18-gauge spinal needles were utilized to localize 4-5 interspace, confirmed with x-ray.  Incision was made in spinous process at 4-5. Subcutaneous tissue was dissected.  Electrocautery was utilized to achieve hemostasis.  A 0.25% Marcaine with epinephrine was infiltrated in the paraspinous musculature.  Dorsolumbar fascia was identified and divided in line with the skin incision.  Paraspinous muscle was elevated from  lamina of 4 and 5.  Extensive scar tissue was noted.  We mobilized the scar tissue.  We skeletonized the previous laminotomy with a very small interlaminar window.  Used a straight curette, detached the epidural fibrosis from the cephalad edge of lamina of 5 and the caudad edge of 4.  We enlarged the hemilaminotomy at 4, cephalad with a 3 and a 2-mm Kerrison, protecting the neural elements.  Partial medial hemifacetectomy was performed on the medial aspect of the facet, then identified the 5 root after performing a foraminotomy.  Just to the pedicle of 5 and mobilized the thecal sac and protected the roots, entered the disk space and removed two large disk fragments with a micropituitary, this further decompressed the 5 root.  She had significant compression epidural fibrosis into the foramen of 4.  We therefore performed a generous foraminotomy of 4.  Protecting the 4 root once this was decompressed.  The neural probe passed freely at the foramen of 4 and down 5, and just beneath the thecal sac.  I irrigated the disk space with catheter antibiotic irrigation.  No further fragments were retrieved.  Bipolar electrocautery was utilized to achieve hemostasis as was thrombin-soaked Gelfoam.  We placed thrombin- soaked Gelfoam in the laminotomy defect.  No evidence of CSF leakage or active bleeding.  Confirmatory radiograph obtained.  We removed the  McCullough retractor, irrigated the paraspinous musculature, no active bleeding.  We repaired the dorsolumbar fascia with 1 Vicryl, subcu with 2-0, and skin with subcuticular Prolene.  Sterile dressing was applied.  Placed supine on the hospital bed, extubated without difficulty, and transported to the recovery room in satisfactory condition.  The patient tolerated the procedure well.  No complications.  Assistant, Lanna Poche, P.A.  Minimal blood loss.     Jene Every, M.D.     Cordelia Pen  D:  10/12/2014  T:  10/12/2014  Job:   [id number]

## 2014-10-12 NOTE — Transfer of Care (Signed)
Immediate Anesthesia Transfer of Care Note  Patient: [name] SatoHelen J Simpson  Procedure(s) Performed: Procedure(s): REVISION MICRO LUMBAR/DECOMPRESSION L4-5 LEFT  (Left)  Patient Location: PACU  Anesthesia Type:General  Level of Consciousness: sedated  Airway & Oxygen Therapy: Patient Spontanous Breathing and Patient connected to face mask oxygen  Post-op Assessment: Report given to RN and Post -op Vital signs reviewed and stable  Post vital signs: Reviewed and stable  Last Vitals: There were no vitals filed for this visit.  Complications: No apparent anesthesia complications

## 2014-10-12 NOTE — Anesthesia Preprocedure Evaluation (Signed)
Anesthesia Evaluation  Patient identified by MRN, date of birth, ID band Patient awake    Reviewed: Allergy & Precautions, H&P , NPO status , Patient's Chart, lab work & pertinent test results, reviewed documented beta blocker date and time   Airway Mallampati: III  TM Distance: >3 FB Neck ROM: full    Dental no notable dental hx. (+) Dental Advisory Given   Pulmonary sleep apnea and Continuous Positive Airway Pressure Ventilation ,  S/p UVVP breath sounds clear to auscultation  Pulmonary exam normal       Cardiovascular Exercise Tolerance: Good hypertension, Pt. on home beta blockers and Pt. on medications + CAD and + Cardiac Stents Normal cardiovascular examRhythm:regular Rate:Normal     Neuro/Psych negative neurological ROS  negative psych ROS   GI/Hepatic negative GI ROS, Neg liver ROS, GERD-  Medicated and Controlled,  Endo/Other  diabetes, Well Controlled, Type 2, Oral Hypoglycemic AgentsMorbid obesity  Renal/GU negative Renal ROS  negative genitourinary   Musculoskeletal   Abdominal (+) + obese,   Peds  Hematology negative hematology ROS (+)   Anesthesia Other Findings   Reproductive/Obstetrics negative OB ROS                             Anesthesia Physical Anesthesia Plan  ASA: III  Anesthesia Plan: General   Post-op Pain Management:    Induction: Intravenous  Airway Management Planned: Oral ETT  Additional Equipment:   Intra-op Plan:   Post-operative Plan: Extubation in OR  Informed Consent: I have reviewed the patients History and Physical, chart, labs and discussed the procedure including the risks, benefits and alternatives for the proposed anesthesia with the patient or authorized representative who has indicated his/her understanding and acceptance.   Dental Advisory Given  Plan Discussed with: CRNA and Surgeon  Anesthesia Plan Comments:          Anesthesia Quick Evaluation

## 2014-10-13 ENCOUNTER — Encounter (HOSPITAL_COMMUNITY): Payer: Self-pay | Admitting: Specialist

## 2014-10-13 DIAGNOSIS — M4806 Spinal stenosis, lumbar region: Secondary | ICD-10-CM | POA: Diagnosis not present

## 2014-10-13 LAB — BASIC METABOLIC PANEL
Anion gap: 10 (ref 5–15)
BUN: 19 mg/dL (ref 6–20)
CO2: 24 mmol/L (ref 22–32)
CREATININE: 0.72 mg/dL (ref 0.44–1.00)
Calcium: 9.1 mg/dL (ref 8.9–10.3)
Chloride: 104 mmol/L (ref 101–111)
GFR calc non Af Amer: >60 mL/min (ref >60)
GLUCOSE: 143 mg/dL — AB (ref 65–99)
POTASSIUM: 4.2 mmol/L (ref 3.5–5.1)
Sodium: 138 mmol/L (ref 135–145)

## 2014-10-13 LAB — GLUCOSE, CAPILLARY: Glucose-Capillary: 124 mg/dL — ABNORMAL HIGH (ref 65–99)

## 2014-10-13 MED ORDER — ASPIRIN EC 81 MG PO TBEC: 81.0000 mg | DELAYED_RELEASE_TABLET | Freq: Every day | ORAL | Status: DC

## 2014-10-13 MED ORDER — CLOPIDOGREL BISULFATE 75 MG PO TABS
75.0000 mg | ORAL_TABLET | Freq: Every day | ORAL | Status: DC
Start: 2014-10-13 — End: 2014-11-21

## 2014-10-13 NOTE — Care Management Note (Signed)
Case Management Note  Patient Details  Name: GWENETH FREDLUND MRN: [id number] Date of Birth: 06-24-1950  Subjective/Objective:                  REVISION MICRO LUMBAR/DECOMPRESSION L4-5 LEFT (Left)  Action/Plan:  Discharge planning Expected Discharge Date:  10/13/14               Expected Discharge Plan:  Home/Self Care  In-House Referral:     Discharge planning Services  CM Consult  Post Acute Care Choice:    Choice offered to:     DME Arranged:    DME Agency:     HH Arranged:    HH Agency:     Status of Service:  Completed, signed off  Medicare Important Message Given:    Date Medicare IM Given:    Medicare IM give by:    Date Additional Medicare IM Given:    Additional Medicare Important Message give by:     If discussed at [place] of Stay Meetings, dates discussed:    Additional Comments: CM met with pt in room to discuss home needs.  Pt states she does not need any HH services and has rolling walker, 3n1 and shower aids. No other CM needs were communicated. Dellie Catholic, RN 10/13/2014, 12:16 PM

## 2014-10-13 NOTE — Discharge Summary (Signed)
Physician Discharge Summary   Patient ID: [name] MRN: [id number] DOB/AGE: 1950-07-13 64 y.o.  Admit date: 10/12/2014 Discharge date: 10/13/2014  Primary Diagnosis:   RECURRENT HNP  Admission Diagnoses:  Past Medical History  Diagnosis Date  . Anemia   . Diabetes mellitus without complication   . Hypertension   . Hyperlipidemia LDL goal < 70   . CAD (coronary artery disease)     PCI to LAD in 2010, mild residual disease in LCX and RCA  . GERD (gastroesophageal reflux disease)   . Plantar fascia rupture     Left Foot  . Lumbar back pain   . Family history of adverse reaction to anesthesia     pts mother had difficulty awakening   . Anginal pain     pt relates to stress pt was seen by Dr Debara Pickett 3 wks ago was given metoprolol   . Sleep apnea     uses CPAP   . Bronchitis     hx of   . Numbness     left leg and foot   . Cataracts, bilateral    Discharge Diagnoses:   Principal Problem:   HNP (herniated nucleus pulposus), lumbar Active Problems:   Spinal stenosis at L4-L5 level  Procedure:  Procedure(s) (LRB): REVISION MICRO LUMBAR/DECOMPRESSION L4-5 LEFT  (Left)   Consults: None  HPI:  see H&P    Laboratory Data: Hospital Outpatient Visit on 10/06/2014  Component Date Value Ref Range Status  . Sodium 10/06/2014 139  135 - 145 mmol/L Final  . Potassium 10/06/2014 4.3  3.5 - 5.1 mmol/L Final  . Chloride 10/06/2014 105  101 - 111 mmol/L Final  . CO2 10/06/2014 27  22 - 32 mmol/L Final  . Glucose, Bld 10/06/2014 138* 65 - 99 mg/dL Final  . BUN 10/06/2014 15  6 - 20 mg/dL Final  . Creatinine, Ser 10/06/2014 0.75  0.44 - 1.00 mg/dL Final  . Calcium 10/06/2014 9.5  8.9 - 10.3 mg/dL Final  . GFR calc non Af Amer 10/06/2014 >60  >60 mL/min Final  . GFR calc Af Amer 10/06/2014 >60  >60 mL/min Final   Comment: (NOTE) The eGFR has been calculated using the CKD EPI equation. This calculation has not been validated in all clinical situations. eGFR's persistently <60  mL/min signify possible Chronic Kidney Disease.   . Anion gap 10/06/2014 7  5 - 15 Final  . WBC 10/06/2014 9.6  4.0 - 10.5 K/uL Final  . RBC 10/06/2014 4.80  3.87 - 5.11 MIL/uL Final  . Hemoglobin 10/06/2014 12.1  12.0 - 15.0 g/dL Final  . HCT 10/06/2014 37.8  36.0 - 46.0 % Final  . MCV 10/06/2014 78.8  78.0 - 100.0 fL Final  . MCH 10/06/2014 25.2* 26.0 - 34.0 pg Final  . MCHC 10/06/2014 32.0  30.0 - 36.0 g/dL Final  . RDW 10/06/2014 14.4  11.5 - 15.5 % Final  . Platelets 10/06/2014 291  150 - 400 K/uL Final  . MRSA, PCR 10/06/2014 NEGATIVE  NEGATIVE Final  . Staphylococcus aureus 10/06/2014 POSITIVE* NEGATIVE Final   Comment:        The Xpert SA Assay (FDA approved for NASAL specimens in patients over 93 years of age), is one component of a comprehensive surveillance program.  Test performance has been validated by [facility] for patients greater than or equal to 18 year old. It is not intended to diagnose infection nor to guide or monitor treatment.    No results for  input(s): HGB in the last 72 hours. No results for input(s): WBC, RBC, HCT, PLT in the last 72 hours.  Recent Labs  10/13/14 0434  NA 138  K 4.2  CL 104  CO2 24  BUN 19  CREATININE 0.72  GLUCOSE 143*  CALCIUM 9.1   No results for input(s): LABPT, INR in the last 72 hours.  X-Rays:Dg Lumbar Spine 2-3 Views  10/06/2014   CLINICAL DATA:  Lumbar spine surgery.  EXAM: LUMBAR SPINE - 2-3 VIEW  COMPARISON:  01/06/2013.  FINDINGS: Lumbar vertebra are numbered as per prior lumbar spine exam. Multilevel disc degeneration. No acute bony abnormality. Aortoiliac atherosclerotic vascular disease .  IMPRESSION: 1. Multilevel disc degeneration again noted. No significant interim change. No acute bony abnormality. 2. Aortoiliac atherosclerotic vascular disease.   Electronically Signed   By: Marcello Moores  Register   On: 10/06/2014 09:29   Dg Spine Portable 1 View  10/12/2014   CLINICAL DATA:  Intraoperative film #3, Sign  lumbar decompression at L4-5 and possible at L3-4  EXAM: PORTABLE SPINE - 1 VIEW  COMPARISON:  Plain film from earlier same day.  FINDINGS: Intraoperative cross-table lateral of the lumbar spine is provided and compared to plain film from earlier same day. The surgical curette from a posterior approach now overlies the posterior margin of the L4-5 disc space.  IMPRESSION: IMPRESSION  Surgical curette now overlying the posterior margin of the L4-5 disc space.  Please refer to the operative report for details of intraoperative findings and procedure.   Electronically Signed   By: Franki Cabot M.D.   On: 10/12/2014 12:44   Dg Spine Portable 1 View  10/12/2014   CLINICAL DATA:  64 year old female with a history of lumbar surgery  EXAM: PORTABLE SPINE - 1 VIEW  COMPARISON:  Contemporaneously plain film of the same date  FINDINGS: Intraoperative cross-table lateral.  Surgical curette from the posterior approach designate the pedicles of L5.  IMPRESSION: Intraoperative cross-table lateral with surgical curette designating the pedicles of L5.  Please refer to the dictated operative report for full details of intraoperative findings and procedure.  Signed,  Dulcy Fanny. Earleen Newport, DO  [facility]  [facility]   Electronically Signed   By: Corrie Mckusick D.O.   On: 10/12/2014 11:36   Dg Spine Portable 1 View  10/12/2014   CLINICAL DATA:  64 year old female with a history of lumbar pain and intraoperative images for surgery  EXAM: PORTABLE SPINE - 1 VIEW  COMPARISON:  10/06/2014, MRI lumbar spine 08/26/2008  FINDINGS: Intraoperative cross-table lateral of the lumbar spine.  Two surgical probes are present within the soft tissues overlying the lumbar spine. The caudal probe designate's the L5-S1 disc space. The more cranial probe designate the spinous process of L4.  IMPRESSION: Single intraoperative cross-table lateral demonstrates cranial surgical probe designating the spinous  process of L4, with the more caudal surgical probe designating the disc space at the L5-S1 level.  Signed,  Dulcy Fanny. Earleen Newport, DO  [facility]  [facility]   Electronically Signed   By: Corrie Mckusick D.O.   On: 10/12/2014 11:28    EKG: Orders placed or performed in visit on 09/21/14  . EKG 12-Lead     Hospital Course: Patient was admitted to [facility] and taken to the OR and underwent the above state procedure without complications.  Patient tolerated the procedure well and was later transferred to the recovery room and then to the orthopaedic  floor for postoperative care.  They were given PO and IV analgesics for pain control following their surgery.  They were given 24 hours of postoperative antibiotics.   PT was consulted postop to assist with mobility and transfers.  The patient was allowed to be WBAT with therapy and was taught back precautions. Discharge planning was consulted to help with postop disposition and equipment needs.  Patient had a good night on the evening of surgery and started to get up OOB with therapy on day one. Patient was seen in rounds and was ready to go home on day one.  They were given discharge instructions and dressing directions.  They were instructed on when to follow up in the office with Dr. Tonita Cong.   Diet: Diabetic diet Activity:WBAT; Lspine precautions Follow-up:in 14 days Disposition - Home Discharged Condition: good   Discharge Instructions    Call MD / Call 911    Complete by:  As directed   If you experience chest pain or shortness of breath, CALL 911 and be transported to the hospital emergency room.  If you develope a fever above 101 F, pus (white drainage) or increased drainage or redness at the wound, or calf pain, call your surgeon's office.     Constipation Prevention    Complete by:  As directed   Drink plenty of fluids.  Prune juice may be helpful.  You may use a stool softener, such as  Colace (over the counter) 100 mg twice a day.  Use MiraLax (over the counter) for constipation as needed.     Diet - low sodium heart healthy    Complete by:  As directed      Increase activity slowly as tolerated    Complete by:  As directed             Medication List    STOP taking these medications        cyclobenzaprine 10 MG tablet  Commonly known as:  FLEXERIL      TAKE these medications        aspirin EC 81 MG tablet  Take 1 tablet (81 mg total) by mouth daily. Resume 5 days post-op     cetirizine 10 MG tablet  Commonly known as:  ZYRTEC  Take 1 tablet (10 mg total) by mouth daily.     cholecalciferol 1000 UNITS tablet  Commonly known as:  VITAMIN D  Take 1,000 Units by mouth daily.     clopidogrel 75 MG tablet  Commonly known as:  PLAVIX  Take 1 tablet (75 mg total) by mouth daily. Resume 5 days post-op     diazepam 2 MG tablet  Commonly known as:  VALIUM  Take 1 tablet (2 mg total) by mouth every 8 (eight) hours as needed. For muscle spasm     docusate sodium 100 MG capsule  Commonly known as:  COLACE  Take 1 capsule (100 mg total) by mouth 2 (two) times daily as needed for mild constipation.     famotidine 20 MG tablet  Commonly known as:  PEPCID  TAKE 1 TABLET BY MOUTH 2 TIMES DAILY.     fluticasone 50 MCG/ACT nasal spray  Commonly known as:  FLONASE  Place 2 sprays into both nostrils daily. Take two sprays in each nostril daily.     hydrochlorothiazide 25 MG tablet  Commonly known as:  HYDRODIURIL  TAKE 1 TABLET BY MOUTH ONCE DAILY IN THE MORNING     metFORMIN 500 MG 24 hr  tablet  Commonly known as:  GLUCOPHAGE-XR  TAKE 1 TABLET (500 MG TOTAL) BY MOUTH DAILY WITH BREAKFAST.  "OV NEEDED FOR ADDITIONAL REFILLS"     methocarbamol 500 MG tablet  Commonly known as:  ROBAXIN  Take 1 tablet (500 mg total) by mouth 3 (three) times daily between meals as needed for muscle spasms.     metoprolol succinate 25 MG 24 hr tablet  Commonly known as:   TOPROL-XL  Take 0.5 tablets (12.5 mg total) by mouth daily.     oxyCODONE-acetaminophen 5-325 MG per tablet  Commonly known as:  PERCOCET  Take 1-2 tablets by mouth every 4 (four) hours as needed.     potassium chloride SA 20 MEQ tablet  Commonly known as:  K-DUR,KLOR-CON  Take 1 tablet (20 mEq total) by mouth daily.     simvastatin 40 MG tablet  Commonly known as:  ZOCOR  TAKE 1 TABLET BY MOUTH EVERY EVENING.  "OV NEEDED"     telmisartan 40 MG tablet  Commonly known as:  MICARDIS  Take 1 tablet (40 mg total) by mouth every morning.     traMADol 50 MG tablet  Commonly known as:  ULTRAM  TAKE 1 TABLET BY MOUTH EVERY 6 HOURS AS NEEDED     TRUETEST TEST test strip  Generic drug:  glucose blood  TEST ONCE DAILY AS DIRECTED           Follow-up Information    Follow up with BEANE,JEFFREY C, MD In 2 weeks.   Specialty:  Orthopedic Surgery   Why:  For suture removal   Contact information:   [address] [place] [address] [phone]       Follow up with Johnn Hai, MD.   Specialty:  Orthopedic Surgery   Contact information:   [address] [place] [address] [place] [address] [phone]       Signed: Lacie Draft, PA-C Orthopaedic Surgery 10/13/2014, 10:21 AM

## 2014-10-13 NOTE — Evaluation (Addendum)
Physical Therapy Evaluation Patient Details Name: [name] MRN: [id number] DOB: 1950-05-22 Today's Date: 10/13/2014   History of Present Illness  s/p L4-5 decompression, h/o decompression  Clinical Impression  Patient evaluated by Physical Therapy with no further acute PT needs identified. All education has been completed and the patient has no further questions.  See below for any follow-up Physial Therapy or equipment needs. PT is signing off. Thank you for this referral. Pt very pleasant and motivated, did very well; no further needs, has RW;     Follow Up Recommendations No f/u    Equipment Recommendations  None recommended by PT    Recommendations for Other Services       Precautions / Restrictions Precautions Precautions: Back Restrictions Weight Bearing Restrictions: No      Mobility  Bed Mobility Overal bed mobility: Needs Assistance Bed Mobility: Rolling;Sidelying to Sit Rolling: Supervision Sidelying to sit: Supervision       General bed mobility comments: pt verbalizes technique, OOB with OT  Transfers Overall transfer level: Needs assistance Equipment used: Rolling walker (2 wheeled) Transfers: Sit to/from Stand Sit to Stand: Supervision         General transfer comment: for safety:  pt reports some of her cardiac meds make her lightheaded  Ambulation/Gait Ambulation/Gait assistance: Supervision Ambulation Distance (Feet): 300 Feet Assistive device: Rolling walker (2 wheeled) Gait Pattern/deviations: Step-to pattern;Step-through pattern;Decreased step length - right;Decreased step length - left     General Gait Details: cues for  posture, back precautions during turns  Stairs  up/down 3 stairs with 2 rails and min/guard to supervision          Wheelchair Mobility    Modified Rankin (Stroke Patients Only)       Balance Overall balance assessment: History of Falls (per husband they are d/t her L foot problems)                                            Pertinent Vitals/Pain Pain Assessment: 0-10 Pain Score: 3  Pain Location: back Pain Descriptors / Indicators: Sore Pain Intervention(s): Limited activity within patient's tolerance;Monitored during session;Premedicated before session    Home Living Family/patient expects to be discharged to:: Private residence Living Arrangements: Spouse/significant other Available Help at Discharge: Family Type of Home: House Home Access: Stairs to enter Entrance Stairs-Rails: Right;Left;Can reach both Entrance Stairs-Number of Steps: 3 Home Layout: One level Home Equipment: Bedside commode;Shower seat      Prior Function Level of Independence: Independent         Comments: has difficulty with L foot; congential fusion     Hand Dominance        Extremity/Trunk Assessment   Upper Extremity Assessment: Defer to OT evaluation;Overall WFL for tasks assessed           Lower Extremity Assessment: Overall WFL for tasks assessed         Communication   Communication: No difficulties  Cognition Arousal/Alertness: Awake/alert Behavior During Therapy: WFL for tasks assessed/performed Overall Cognitive Status: Within Functional Limits for tasks assessed                      General Comments      Exercises        Assessment/Plan    PT Assessment Patent does not need any further PT services  PT Diagnosis Difficulty walking  PT Problem List    PT Treatment Interventions     PT Goals (Current goals can be found in the Care Plan section) Acute Rehab PT Goals Patient Stated Goal: get back healed PT Goal Formulation: All assessment and education complete, DC therapy    Frequency     Barriers to discharge        Co-evaluation               End of Session   Activity Tolerance: Patient tolerated treatment well Patient left: with call bell/phone within reach;in chair      Functional Assessment Tool Used:  clinical judgement Functional Limitation: Mobility: Walking and moving around Mobility: Walking and Moving Around Current Status (W1191): At least 1 percent but less than 20 percent impaired, limited or restricted Mobility: Walking and Moving Around Goal Status [phone]): At least 1 percent but less than 20 percent impaired, limited or restricted Mobility: Walking and Moving Around Discharge Status [phone]): At least 1 percent but less than 20 percent impaired, limited or restricted    Time: 0935-0953 PT Time Calculation (min) (ACUTE ONLY): 18 min   Charges:   PT Evaluation $Initial PT Evaluation Tier I: 1 Procedure     PT G Codes:   PT G-Codes **NOT FOR INPATIENT CLASS** Functional Assessment Tool Used: clinical judgement Functional Limitation: Mobility: Walking and moving around Mobility: Walking and Moving Around Current Status (Y8657): At least 1 percent but less than 20 percent impaired, limited or restricted Mobility: Walking and Moving Around Goal Status [phone]): At least 1 percent but less than 20 percent impaired, limited or restricted Mobility: Walking and Moving Around Discharge Status [phone]): At least 1 percent but less than 20 percent impaired, limited or restricted    [facility] 10/13/2014, 10:33 AM

## 2014-10-13 NOTE — Evaluation (Signed)
Occupational Therapy Evaluation Patient Details Name: [name] MRN: [id number] DOB: 1951/01/29 Today's Date: 10/13/2014    History of Present Illness s/p L4-5 decompression, h/o decompression   Clinical Impression   This 64 year old female was admitted for the above surgery. All education was completed.  No further OT is needed at this time    Follow Up Recommendations  No OT follow up;Supervision/Assistance - 24 hour    Equipment Recommendations  None recommended by OT    Recommendations for Other Services       Precautions / Restrictions Precautions Precautions: Back Restrictions Weight Bearing Restrictions: No      Mobility Bed Mobility Overal bed mobility: Needs Assistance Bed Mobility: Rolling;Sidelying to Sit Rolling: Supervision Sidelying to sit: Supervision       General bed mobility comments: cues for back precautions  Transfers Overall transfer level: Needs assistance Equipment used: Rolling walker (2 wheeled) Transfers: Sit to/from Stand Sit to Stand: Min guard         General transfer comment: for safety:  pt reports some of her cardiac meds make her lightheaded    Balance                                            ADL Overall ADL's : Needs assistance/impaired     Grooming: Oral care;Supervision/safety;Standing   Upper Body Bathing: Set up;Sitting   Lower Body Bathing: Minimal assistance;Sit to/from stand   Upper Body Dressing : Set up;Sitting   Lower Body Dressing: Moderate assistance;Sit to/from stand   Toilet Transfer: Min guard;Ambulation (recliner)   Toileting- Clothing Manipulation and Hygiene: Minimal assistance;Sit to/from stand         General ADL Comments: pt completed part of ADL--she has AE kit and toilet aide at home.  Recommended she use reacher for pants:  she is able to cross LLE over R but not vice versa.  Reviewed safely stepping over tub maintaining back precautions.       Vision      Perception     Praxis      Pertinent Vitals/Pain Pain Assessment: 0-10 Pain Score: 2  Pain Location: back Pain Descriptors / Indicators: Sore Pain Intervention(s): Limited activity within patient's tolerance;Premedicated before session     Hand Dominance     Extremity/Trunk Assessment Upper Extremity Assessment Upper Extremity Assessment: Overall WFL for tasks assessed           Communication Communication Communication: No difficulties   Cognition Arousal/Alertness: Awake/alert Behavior During Therapy: WFL for tasks assessed/performed Overall Cognitive Status: Within Functional Limits for tasks assessed                     General Comments       Exercises       Shoulder Instructions      Home Living Family/patient expects to be discharged to:: Private residence Living Arrangements: Spouse/significant other Available Help at Discharge: Family               Bathroom Shower/Tub: Tub/shower unit Shower/tub characteristics: Architectural technologist: Standard     Home Equipment: Bedside commode;Shower seat (AE kit)          Prior Functioning/Environment Level of Independence: Independent        Comments: has difficulty with L foot; congential fusion    OT Diagnosis: Generalized weakness   OT Problem List:  OT Treatment/Interventions:      OT Goals(Current goals can be found in the care plan section) Acute Rehab OT Goals Patient Stated Goal: get back healed  OT Frequency:     Barriers to D/C:            Co-evaluation              End of Session Nurse Communication:  (dressing rolled up)  Activity Tolerance: Patient tolerated treatment well Patient left: in chair;with call bell/phone within reach;with nursing/sitter in room;with family/visitor present   Time: 3785-8850 OT Time Calculation (min): 22 min Charges:  OT General Charges $OT Visit: 1 Procedure OT Evaluation $Initial OT Evaluation Tier I: 1  Procedure G-Codes: OT G-codes **NOT FOR INPATIENT CLASS** Functional Assessment Tool Used: clinical observation/judgment Functional Limitation: Self care Self Care Current Status (Y7741): At least 40 percent but less than 60 percent impaired, limited or restricted Self Care Goal Status (O8786): At least 40 percent but less than 60 percent impaired, limited or restricted Self Care Discharge Status [phone]): At least 40 percent but less than 60 percent impaired, limited or restricted  [facility] 10/13/2014, 8:29 AM  Lesle Chris, OTR/L [phone] 10/13/2014

## 2014-10-13 NOTE — Progress Notes (Signed)
Leanna Sato Livas to be D/C'd Home per MD order.  Discussed with the patient and all questions fully answered.  VSS, Skin clean, dry and intact without evidence of skin break down, no evidence of skin tears noted. IV catheter discontinued intact. Site without signs and symptoms of complications. Dressing and pressure applied.  An After Visit Summary was printed and given to the patient. Patient received prescription.  D/c education completed with patient/family including follow up instructions, medication list, d/c activities limitations if indicated, with other d/c instructions as indicated by MD - patient able to verbalize understanding, all questions fully answered.   Patient instructed to return to ED, call 911, or call MD for any changes in condition.   Patient escorted via WC, and D/C home via private auto.  Sky Borboa D 10/13/2014 10:32 AM

## 2014-10-13 NOTE — Progress Notes (Signed)
Subjective: 1 Day Post-Op Procedure(s) (LRB): REVISION MICRO LUMBAR/DECOMPRESSION L4-5 LEFT  (Left) Patient reports pain as mild.  Pain well controlled. Did not sleep much last night. Noting improvement in her leg pain already. Feels ready to go home today. Seen by myself and Dr. Shelle Iron   Objective: Vital signs in last 24 hours: Temp:  [97.3 F (36.3 C)-98.8 F (37.1 C)] 98.5 F (36.9 C) (07/21 0546) Pulse Rate:  [50-69] 57 (07/21 0546) Resp:  [15-20] 16 (07/21 0152) BP: (105-146)/(32-56) 124/55 mmHg (07/21 0546) SpO2:  [94 %-100 %] 97 % (07/21 0546) Weight:  [102.059 kg (225 lb)] 102.059 kg (225 lb) (07/20 2000)  Intake/Output from previous day: 07/20 0701 - 07/21 0700 In: 3122.5 [P.O.:240; I.V.:2832.5; IV Piggyback:50] Out: 2630 [Urine:2600; Blood:30] Intake/Output this shift: Total I/O In: 360 [P.O.:360] Out: -   No results for input(s): HGB in the last 72 hours. No results for input(s): WBC, RBC, HCT, PLT in the last 72 hours.  Recent Labs  10/13/14 0434  NA 138  K 4.2  CL 104  CO2 24  BUN 19  CREATININE 0.72  GLUCOSE 143*  CALCIUM 9.1   No results for input(s): LABPT, INR in the last 72 hours.  Neurologically intact ABD soft Neurovascular intact Sensation intact distally Intact pulses distally Dorsiflexion/Plantar flexion intact Incision: dressing C/D/I and no drainage No cellulitis present Compartment soft no sign of DVT  Assessment/Plan: 1 Day Post-Op Procedure(s) (LRB): REVISION MICRO LUMBAR/DECOMPRESSION L4-5 LEFT  (Left) Advance diet Up with therapy D/C IV fluids  D/C home today Discussed D/C instructions, Lspine precautions, dressing instructions Follow up in office in 2 weeks for suture removal  [name] M. 10/13/2014, 10:17 AM

## 2014-11-21 ENCOUNTER — Other Ambulatory Visit: Payer: Self-pay | Admitting: Internal Medicine

## 2014-11-21 ENCOUNTER — Other Ambulatory Visit: Payer: Self-pay | Admitting: Family Medicine

## 2014-11-21 NOTE — Telephone Encounter (Signed)
REFILL 

## 2014-11-23 ENCOUNTER — Telehealth: Payer: Self-pay

## 2014-11-23 MED ORDER — HYDROCHLOROTHIAZIDE 25 MG PO TABS: ORAL_TABLET | ORAL | Status: DC

## 2014-11-23 MED ORDER — SIMVASTATIN 40 MG PO TABS: ORAL_TABLET | ORAL | Status: DC

## 2014-11-23 MED ORDER — POTASSIUM CHLORIDE CRYS ER 20 MEQ PO TBCR: EXTENDED_RELEASE_TABLET | ORAL | Status: DC

## 2014-11-23 NOTE — Telephone Encounter (Signed)
Patient came by office and said she needed a refill of her hydrochlorothiazide, Potassium Chloride, and Simvistatin   Last BMEt 10/13/14,  Last Lipid 09/29/14    Last office visit 09/21/2014 Dr. Rennis Golden

## 2014-12-19 ENCOUNTER — Other Ambulatory Visit: Payer: Self-pay | Admitting: Family Medicine

## 2015-01-18 ENCOUNTER — Encounter: Payer: Self-pay | Admitting: Family Medicine

## 2015-01-18 ENCOUNTER — Ambulatory Visit (INDEPENDENT_AMBULATORY_CARE_PROVIDER_SITE_OTHER): Payer: PPO | Admitting: Family Medicine

## 2015-01-18 VITALS — BP 128/60 | HR 55 | Temp 97.9°F | Resp 16 | Wt 218.4 lb

## 2015-01-18 DIAGNOSIS — M629 Disorder of muscle, unspecified: Secondary | ICD-10-CM | POA: Diagnosis not present

## 2015-01-18 DIAGNOSIS — S93699A Other sprain of unspecified foot, initial encounter: Secondary | ICD-10-CM

## 2015-01-18 DIAGNOSIS — Z23 Encounter for immunization: Secondary | ICD-10-CM

## 2015-01-18 DIAGNOSIS — E119 Type 2 diabetes mellitus without complications: Secondary | ICD-10-CM | POA: Diagnosis not present

## 2015-01-18 NOTE — Progress Notes (Signed)
[facility] [address], [place] [address] [phone]  Date:  01/18/2015   Name:  [name] PeonHelen J Girardot   DOB:  [birth year]   MRN:  [id number]  PCP:  Abbe AmsterdamOPLAND,JESSICA, MD    Chief Complaint: has paperwork for surgery   History of Present Illness:  [name] is a 64 y.o. very pleasant female patient who presents with the following:  She had a microdiscetomy in July. Her back is improved Dr. Shelle IronBeane would like for her to get custom inserts for her shoes- she has paperwork that needs to be completed in order to have these done.  She has had inserts in the past which do help- due for new ones  Last cholesterol panel and A1c in July of this year.    She had a flu shot today Needs prenar today- pneumovax done in 2010  Lab Results  Component Value Date   HGBA1C 6.9* 09/29/2014   She is controlled with oral medications   Patient Active Problem List   Diagnosis Date Noted  . HNP (herniated nucleus pulposus), lumbar 10/12/2014  . Spinal stenosis at L4-L5 level 10/12/2014  . Obesity, Class III, BMI 40-49.9 (morbid obesity) (HCC) 07/29/2014  . OSA on CPAP 07/29/2014  . Iron deficiency anemia 07/29/2014  . DM (diabetes mellitus) with complications (HCC) 07/29/2014  . Environmental and seasonal allergies 04/28/2014  . OSA (obstructive sleep apnea) 02/22/2014  . Spinal stenosis, lumbar region, with neurogenic claudication 01/06/2013  . Obesity, morbid, BMI 40.0-49.9 (HCC) 10/20/2012  . Chest pain 10/02/2012  . CAD (coronary artery disease)PCI to LAD in 2010, mild residual disease in LCX and RCA   . Hyperlipidemia with target LDL less than 70   . Diabetes mellitus, type 2 (HCC) 04/16/2012  . HTN (hypertension) 04/16/2012    Past Medical History  Diagnosis Date  . Anemia   . Diabetes mellitus without complication (HCC)   . Hypertension   . Hyperlipidemia LDL goal < 70   . CAD (coronary artery disease)     PCI to LAD in 2010, mild residual disease in LCX and  RCA  . GERD (gastroesophageal reflux disease)   . Plantar fascia rupture     Left Foot  . Lumbar back pain   . Family history of adverse reaction to anesthesia     pts mother had difficulty awakening   . Anginal pain (HCC)     pt relates to stress pt was seen by Dr Rennis GoldenHilty 3 wks ago was given metoprolol   . Sleep apnea     uses CPAP   . Bronchitis     hx of   . Numbness     left leg and foot   . Cataracts, bilateral     Past Surgical History  Procedure Laterality Date  . Tympanoplasty Bilateral   . Carotid stent  2009    pt denies   . Doppler echocardiography  08/01/2009    EF=>55%,LV normal  . Nm myocar perf wall motion  09/22/2008    lexiscan-EF 83%; glogal LV systolic fx is norm. ,evidence of mild ischemia basal anterior,midanterior and apical lateral region(s).   . Lower arterial duplex  06/20/10    abi's normal,rgt 0.98,lft 1.06;bilateral PVRs normal  . Abdominal hysterectomy    . Coronary angioplasty with stent placement  2010and 10-02-2012    Stent DES, Xience to prox. LAD  . Lumbar laminectomy/decompression microdiscectomy Left 01/06/2013    Procedure: MICRO LUMBAR DECOMPRESSION L4-5 AND L5-S1;  Surgeon: Javier Docker, MD;  Location: [facility];  Service: Orthopedics;  Laterality: Left;  . Left heart catheterization with coronary angiogram N/A 10/02/2012    Procedure: LEFT HEART CATHETERIZATION WITH CORONARY ANGIOGRAM;  Surgeon: Runell Gess, MD;  [facility] C[facility];  Service: Cardiovascular;  Laterality: N/A;  . Uvulopalatopharyngoplasty      pt denies   . Tubal ligation    . Lumbar laminectomy/decompression microdiscectomy Left 10/12/2014    Procedure: REVISION MICRO LUMBAR/DECOMPRESSION L4-5 LEFT ;  Surgeon: Jene Every, MD;  Location: [facility];  Service: Orthopedics;  Laterality: Left;    Social History  Substance Use Topics  . Smoking status: Never Smoker   . Smokeless tobacco: Never Used  . Alcohol Use: Yes     Comment: occasional, 1 a month wine     Family History  Problem Relation Age of Onset  . Coronary artery disease Mother   . Rheum arthritis Mother   . Dementia Mother   . Heart attack Father   . Hypertension Father   . Hyperlipidemia Father   . Other Father     MVA  . Hypertension Brother   . Cancer Brother   . Heart disease Brother   . Cancer Paternal Grandmother     stomach  . Diabetes Paternal Grandfather     Allergies  Allergen Reactions  . Niacin And Related Other (See Comments)    Whelps and skin flushed, mouth tingling  . Tolectin [Tolmetin] Rash    Medication list has been reviewed and updated.  Current Outpatient Prescriptions on File Prior to Visit  Medication Sig Dispense Refill  . aspirin EC 81 MG tablet Take 1 tablet (81 mg total) by mouth daily. Resume 5 days post-op    . cetirizine (ZYRTEC) 10 MG tablet Take 1 tablet (10 mg total) by mouth daily. (Patient taking differently: Take 10 mg by mouth daily as needed for allergies. ) 30 tablet 3  . cholecalciferol (VITAMIN D) 1000 UNITS tablet Take 1,000 Units by mouth daily.    . clopidogrel (PLAVIX) 75 MG tablet Take 1 tablet (75 mg total) by mouth daily. NEED OV. 30 tablet 1  . diazepam (VALIUM) 2 MG tablet Take 1 tablet (2 mg total) by mouth every 8 (eight) hours as needed. For muscle spasm 30 tablet 0  . docusate sodium (COLACE) 100 MG capsule Take 1 capsule (100 mg total) by mouth 2 (two) times daily as needed for mild constipation. 20 capsule 1  . famotidine (PEPCID) 20 MG tablet TAKE 1 TABLET BY MOUTH 2 TIMES DAILY. 180 tablet 0  . fluticasone (FLONASE) 50 MCG/ACT nasal spray Place 2 sprays into both nostrils daily. Take two sprays in each nostril daily. (Patient taking differently: Place 2 sprays into both nostrils daily as needed for allergies. Take two sprays in each nostril daily.) 16 g 12  . hydrochlorothiazide (HYDRODIURIL) 25 MG tablet TAKE 1 TABLET BY MOUTH ONCE DAILY IN THE MORNING 90 tablet 3  . metFORMIN (GLUCOPHAGE-XR) 500 MG 24 hr  tablet TAKE 1 TABLET (500 MG TOTAL) BY MOUTH DAILY WITH BREAKFAST.  "OV NEEDED FOR ADDITIONAL REFILLS" 180 tablet 0  . methocarbamol (ROBAXIN) 500 MG tablet Take 1 tablet (500 mg total) by mouth 3 (three) times daily between meals as needed for muscle spasms. 40 tablet 1  . metoprolol succinate (TOPROL-XL) 25 MG 24 hr tablet Take 0.5 tablets (12.5 mg total) by mouth daily. (Patient taking differently: Take 12.5 mg by mouth every morning. ) 15 tablet  6  . oxyCODONE-acetaminophen (PERCOCET) 5-325 MG per tablet Take 1-2 tablets by mouth every 4 (four) hours as needed. 60 tablet 0  . potassium chloride SA (K-DUR,KLOR-CON) 20 MEQ tablet TAKE 1 TABLET BY MOUTH DAILY.  "NO MORE REFILLS WITHOUT RTC" 90 tablet 3  . simvastatin (ZOCOR) 40 MG tablet TAKE 1 TABLET BY MOUTH EVERY EVENING.  "OV NEEDED" 90 tablet 3  . telmisartan (MICARDIS) 40 MG tablet TAKE 1 TABLET BY MOUTH EVERY MORNING. 90 tablet 1  . traMADol (ULTRAM) 50 MG tablet TAKE 1 TABLET BY MOUTH EVERY 6 HOURS AS NEEDED 30 tablet 0  . TRUETEST TEST test strip TEST ONCE DAILY AS DIRECTED 100 each 3   No current facility-administered medications on file prior to visit.    Review of Systems:  As per HPI- otherwise negative.   Physical Examination: Filed Vitals:   01/18/15 1108  BP: 128/60  Pulse: 55  Temp: 97.9 F (36.6 C)  Resp: 16   Filed Vitals:   01/18/15 1108  Weight: 218 lb 6.4 oz (99.066 kg)   Body mass index is 41.29 kg/(m^2). Ideal Body Weight:    GEN: WDWN, NAD, Non-toxic, A & O x 3, obese, looks well HEENT: Atraumatic, Normocephalic. Neck supple. No masses, No LAD. Ears and Nose: No external deformity. CV: RRR, No M/G/R. No JVD. No thrill. No extra heart sounds. PULM: CTA B, no wheezes, crackles, rhonchi. No retractions. No resp. distress. No accessory muscle use. EXTR: No c/c/e NEURO Normal gait.  PSYCH: Normally interactive. Conversant. Not depressed or anxious appearing.  Calm demeanor.  Did thorough bilateral foot  exam for her diabetic inserts.  She has a ruptured left plantar fascia and a few calluses.  Normal sensation and pulses   Assessment and Plan: Controlled type 2 diabetes mellitus without complication, without long-term current use of insulin (HCC) - Plan: Hemoglobin A1c  Need for prophylactic vaccination and inoculation against influenza - Plan: Flu Vaccine QUAD 36+ mos IM  Immunization due - Plan: Pneumococcal conjugate vaccine 13-valent IM  Ruptured plantar fascia  Await a1c to check progress of her DM Updated flu shot and prevnar Did thorough foot exam and completed paperwork for her to get diabetic custom orthotics made.   She will plan to see me in about 4 months Will plan further follow- up pending labs.   Signed Abbe Amsterdam, MD

## 2015-01-18 NOTE — Patient Instructions (Signed)
Great to see you today!  Take care and I will be in touch with your A1c You got your "prevnar 13" pneumonia shot today, as well as your flu shot Please see me in about 4 months for a recheck

## 2015-01-19 LAB — HEMOGLOBIN A1C
Hgb A1c MFr Bld: 6.9 % — ABNORMAL HIGH (ref <5.7)
Mean Plasma Glucose: 151 mg/dL — ABNORMAL HIGH (ref <117)

## 2015-04-14 ENCOUNTER — Encounter: Payer: Self-pay | Admitting: Family Medicine

## 2015-04-19 ENCOUNTER — Encounter: Payer: Self-pay | Admitting: Family Medicine

## 2015-04-25 MED FILL — METOPROLOL SUCC ER 25 MG TA: 25 | 30 days supply | Qty: 15 | Fill #5

## 2015-05-09 ENCOUNTER — Telehealth: Payer: Self-pay | Admitting: Neurology

## 2015-05-09 ENCOUNTER — Telehealth: Payer: Self-pay

## 2015-05-09 DIAGNOSIS — G4733 Obstructive sleep apnea (adult) (pediatric): Secondary | ICD-10-CM

## 2015-05-09 DIAGNOSIS — Z9989 Dependence on other enabling machines and devices: Principal | ICD-10-CM

## 2015-05-09 NOTE — Telephone Encounter (Signed)
[name], THIS PATIENT NEEDS SUPPLIES FROM [facility] , SHE CANNOT REACH THEM , HOWEVER, SHE HAS CHANGED HER INSURANCE AND HOME PHONE, IT IS UPDATED HER NUMBER IS [phone]

## 2015-05-09 NOTE — Telephone Encounter (Signed)
Patient returned your call and was advised as to your instructions.  Thanks!

## 2015-05-09 NOTE — Telephone Encounter (Signed)
I reached out to [facility] and they will process the cpap supply order.  I called to discuss with pt. No answer, left a message asking her to call me back. When pt calls back, please advise her that I spoke with [facility] and [facility] will contact her to get cpap supplies. I sent them a new order for supplies as well.

## 2015-05-17 DIAGNOSIS — G4733 Obstructive sleep apnea (adult) (pediatric): Secondary | ICD-10-CM | POA: Diagnosis not present

## 2015-05-19 NOTE — Telephone Encounter (Signed)
Faxed signed order for CPAP supplies back to [facility]. Fax: [phone]. Received confirmation. Sent copy to medical records.   

## 2015-05-31 ENCOUNTER — Other Ambulatory Visit: Payer: Self-pay | Admitting: Family Medicine

## 2015-05-31 MED FILL — HYDROCHLOROTHIAZIDE 25 MG T: 25 | 90 days supply | Qty: 90 | Fill #2

## 2015-05-31 MED FILL — METFORMIN HCL ER 500 MG TAB: 500 | 90 days supply | Qty: 90 | Fill #3

## 2015-05-31 MED FILL — POTASSIUM CL ER 20 MEQ TAB: 20 | 90 days supply | Qty: 90 | Fill #1

## 2015-05-31 MED FILL — SIMVASTATIN 40 MG TABLET: 40 | 90 days supply | Qty: 90 | Fill #2

## 2015-05-31 MED FILL — METOPROLOL SUCC ER 25 MG TA: 25 | 30 days supply | Qty: 15 | Fill #6

## 2015-06-02 ENCOUNTER — Telehealth: Payer: Self-pay

## 2015-06-02 NOTE — Telephone Encounter (Signed)
Pre-Visit Call completed. 

## 2015-06-02 NOTE — Telephone Encounter (Signed)
Last OV:  01/18/15 Last filled: 12/20/14 Amt: 90,1 Appt next week.  Will refill med during office visit.

## 2015-06-05 ENCOUNTER — Other Ambulatory Visit: Payer: Self-pay

## 2015-06-05 ENCOUNTER — Encounter: Payer: Self-pay | Admitting: Family Medicine

## 2015-06-05 ENCOUNTER — Ambulatory Visit (INDEPENDENT_AMBULATORY_CARE_PROVIDER_SITE_OTHER): Payer: PPO | Admitting: Family Medicine

## 2015-06-05 VITALS — BP 136/64 | HR 64 | Ht 61.5 in | Wt 225.0 lb

## 2015-06-05 DIAGNOSIS — Z955 Presence of coronary angioplasty implant and graft: Secondary | ICD-10-CM

## 2015-06-05 DIAGNOSIS — Z130 Encounter for screening for diseases of the blood and blood-forming organs and certain disorders involving the immune mechanism: Secondary | ICD-10-CM

## 2015-06-05 DIAGNOSIS — Z1231 Encounter for screening mammogram for malignant neoplasm of breast: Secondary | ICD-10-CM

## 2015-06-05 DIAGNOSIS — G4733 Obstructive sleep apnea (adult) (pediatric): Secondary | ICD-10-CM

## 2015-06-05 DIAGNOSIS — E669 Obesity, unspecified: Secondary | ICD-10-CM

## 2015-06-05 DIAGNOSIS — E785 Hyperlipidemia, unspecified: Secondary | ICD-10-CM | POA: Diagnosis not present

## 2015-06-05 DIAGNOSIS — E119 Type 2 diabetes mellitus without complications: Secondary | ICD-10-CM | POA: Diagnosis not present

## 2015-06-05 DIAGNOSIS — Z0000 Encounter for general adult medical examination without abnormal findings: Secondary | ICD-10-CM

## 2015-06-05 LAB — CBC
HEMATOCRIT: 38.3 % (ref 36.0–46.0)
Hemoglobin: 12.4 g/dL (ref 12.0–15.0)
MCHC: 32.5 g/dL (ref 30.0–36.0)
MCV: 78.7 fl (ref 78.0–100.0)
Platelets: 316 10*3/uL (ref 150.0–400.0)
RBC: 4.87 Mil/uL (ref 3.87–5.11)
RDW: 15.3 % (ref 11.5–15.5)
WBC: 11 10*3/uL — ABNORMAL HIGH (ref 4.0–10.5)

## 2015-06-05 LAB — COMPREHENSIVE METABOLIC PANEL
ALK PHOS: 111 U/L (ref 39–117)
ALT: 19 U/L (ref 0–35)
AST: 16 U/L (ref 0–37)
Albumin: 4.4 g/dL (ref 3.5–5.2)
BUN: 18 mg/dL (ref 6–23)
CO2: 30 mEq/L (ref 19–32)
Calcium: 10 mg/dL (ref 8.4–10.5)
Chloride: 101 mEq/L (ref 96–112)
Creatinine, Ser: 0.8 mg/dL (ref 0.40–1.20)
GFR: 76.36 mL/min (ref >60.00)
GLUCOSE: 144 mg/dL — AB (ref 70–99)
POTASSIUM: 4.1 meq/L (ref 3.5–5.1)
Sodium: 138 mEq/L (ref 135–145)
TOTAL PROTEIN: 7.2 g/dL (ref 6.0–8.3)
Total Bilirubin: 0.5 mg/dL (ref 0.2–1.2)

## 2015-06-05 LAB — LIPID PANEL
Cholesterol: 164 mg/dL (ref 0–200)
HDL: 33.4 mg/dL — AB (ref >39.00)
NonHDL: 131.05
TRIGLYCERIDES: 299 mg/dL — AB (ref 0.0–149.0)
Total CHOL/HDL Ratio: 5
VLDL: 59.8 mg/dL — ABNORMAL HIGH (ref 0.0–40.0)

## 2015-06-05 LAB — LDL CHOLESTEROL, DIRECT: Direct LDL: 78 mg/dL

## 2015-06-05 LAB — HEMOGLOBIN A1C: Hgb A1c MFr Bld: 7.2 % — ABNORMAL HIGH (ref 4.6–6.5)

## 2015-06-05 NOTE — Patient Instructions (Signed)
It was great to see you today!  I will be in touch with your labs We can plan to recheck in 6 months unless your labs suggest otherwise Please have them send me your mammogram report  Please do work on losing a few pounds over the next 6 months   Wt Readings from Last 3 Encounters:  06/05/15 225 lb (102.059 kg)  01/18/15 218 lb 6.4 oz (99.066 kg)  10/12/14 225 lb (102.059 kg)

## 2015-06-05 NOTE — Progress Notes (Signed)
[facility] [address], [address] [place], [place] [address] [phone] [phone]  Date:  06/05/2015   Name:  [name] PeonHelen J Simpson   DOB:  [birth year]   MRN:  [id number]  PCP:  Abbe AmsterdamOPLAND,Zachery Niswander, MD    Chief Complaint: Annual Exam   History of Present Illness:  [name] is a 65 y.o. very pleasant female patient who presents with the following:  Here today for a CPE.   History of OSA, DM, spinal stenosis, CAD s/p stend in 2010, hyperlipidemia. She had a microdisectomy in July 2016 S/p hysterectomy She is fasting today Dr. Rennis GoldenHilty plans to keep her on plavix for life.    She has had prevnar and pneumovax.   She has noted some stiffness in her bilateral ankles- followed by Dr. Victorino DikeHewitt who has told her that this will never quite resolve. She does wear some insoles that help her some. She will use tylenol which helps her  She is using her CPAP machine.  Her back continues to be improved since her surgery She plans to do a mammo soon  Her hyst was done due to fibroids- never had any cancer. Never had an abnl pap  Lab Results  Component Value Date   HGBA1C 6.9* 01/18/2015   She is fasting today for labs   Patient Active Problem List   Diagnosis Date Noted  . HNP (herniated nucleus pulposus), lumbar 10/12/2014  . Spinal stenosis at L4-L5 level 10/12/2014  . Obesity, Class III, BMI 40-49.9 (morbid obesity) (HCC) 07/29/2014  . OSA on CPAP 07/29/2014  . Iron deficiency anemia 07/29/2014  . DM (diabetes mellitus) with complications (HCC) 07/29/2014  . Environmental and seasonal allergies 04/28/2014  . OSA (obstructive sleep apnea) 02/22/2014  . Spinal stenosis, lumbar region, with neurogenic claudication 01/06/2013  . Obesity, morbid, BMI 40.0-49.9 (HCC) 10/20/2012  . Chest pain 10/02/2012  . CAD (coronary artery disease)PCI to LAD in 2010, mild residual disease in LCX and RCA   . Hyperlipidemia with target LDL less than 70   . Diabetes  mellitus, type 2 (HCC) 04/16/2012  . HTN (hypertension) 04/16/2012    Past Medical History  Diagnosis Date  . Anemia   . Diabetes mellitus without complication (HCC)   . Hypertension   . Hyperlipidemia LDL goal < 70   . CAD (coronary artery disease)     PCI to LAD in 2010, mild residual disease in LCX and RCA  . GERD (gastroesophageal reflux disease)   . Plantar fascia rupture     Left Foot  . Lumbar back pain   . Family history of adverse reaction to anesthesia     pts mother had difficulty awakening   . Anginal pain (HCC)     pt relates to stress pt was seen by Dr Rennis GoldenHilty 3 wks ago was given metoprolol   . Sleep apnea     uses CPAP   . Bronchitis     hx of   . Numbness     left leg and foot   . Cataracts, bilateral     Past Surgical History  Procedure Laterality Date  . Tympanoplasty Bilateral   . Carotid stent  2009    pt denies   . Doppler echocardiography  08/01/2009    EF=>55%,LV normal  . Nm myocar perf wall motion  09/22/2008    lexiscan-EF 83%; glogal LV systolic fx is norm. ,evidence of mild ischemia basal anterior,midanterior and apical lateral region(s).   .Marland Kitchen  Lower arterial duplex  06/20/10    abi's normal,rgt 0.98,lft 1.06;bilateral PVRs normal  . Abdominal hysterectomy    . Coronary angioplasty with stent placement  2010and 10-02-2012    Stent DES, Xience to prox. LAD  . Lumbar laminectomy/decompression microdiscectomy Left 01/06/2013    Procedure: MICRO LUMBAR DECOMPRESSION L4-5 AND L5-S1;  Surgeon: Javier Docker, MD;  Location: [facility];  Service: Orthopedics;  Laterality: Left;  . Left heart catheterization with coronary angiogram N/A 10/02/2012    Procedure: LEFT HEART CATHETERIZATION WITH CORONARY ANGIOGRAM;  Surgeon: Runell Gess, MD;  Location: [facility] CATH LAB;  Service: Cardiovascular;  Laterality: N/A;  . Uvulopalatopharyngoplasty      pt denies   . Tubal ligation    . Lumbar laminectomy/decompression microdiscectomy Left 10/12/2014    Procedure:  REVISION MICRO LUMBAR/DECOMPRESSION L4-5 LEFT ;  Surgeon: Jene Every, MD;  Location: [facility];  Service: Orthopedics;  Laterality: Left;    Social History  Substance Use Topics  . Smoking status: Never Smoker   . Smokeless tobacco: Never Used  . Alcohol Use: Yes     Comment: occasional, 1 a month wine    Family History  Problem Relation Age of Onset  . Coronary artery disease Mother   . Rheum arthritis Mother   . Dementia Mother   . Heart attack Father   . Hypertension Father   . Hyperlipidemia Father   . Other Father     MVA  . Hypertension Brother   . Cancer Brother   . Heart disease Brother   . Cancer Paternal Grandmother     stomach  . Diabetes Paternal Grandfather     Allergies  Allergen Reactions  . Niacin And Related Other (See Comments)    Whelps and skin flushed, mouth tingling  . Tolectin [Tolmetin] Rash    Medication list has been reviewed and updated.  Current Outpatient Prescriptions on File Prior to Visit  Medication Sig Dispense Refill  . aspirin EC 81 MG tablet Take 1 tablet (81 mg total) by mouth daily. Resume 5 days post-op    . cetirizine (ZYRTEC) 10 MG tablet Take 1 tablet (10 mg total) by mouth daily. (Patient taking differently: Take 10 mg by mouth daily as needed for allergies. ) 30 tablet 3  . cholecalciferol (VITAMIN D) 1000 UNITS tablet Take 1,000 Units by mouth daily.    . clopidogrel (PLAVIX) 75 MG tablet Take 1 tablet (75 mg total) by mouth daily. NEED OV. 30 tablet 1  . diazepam (VALIUM) 2 MG tablet Take 1 tablet (2 mg total) by mouth every 8 (eight) hours as needed. For muscle spasm 30 tablet 0  . docusate sodium (COLACE) 100 MG capsule Take 1 capsule (100 mg total) by mouth 2 (two) times daily as needed for mild constipation. 20 capsule 1  . famotidine (PEPCID) 20 MG tablet TAKE 1 TABLET BY MOUTH 2 TIMES DAILY. 180 tablet 0  . fluticasone (FLONASE) 50 MCG/ACT nasal spray Place 2 sprays into both nostrils daily. Take two sprays in each  nostril daily. (Patient taking differently: Place 2 sprays into both nostrils daily as needed for allergies. Take two sprays in each nostril daily.) 16 g 12  . hydrochlorothiazide (HYDRODIURIL) 25 MG tablet TAKE 1 TABLET BY MOUTH ONCE DAILY IN THE MORNING 90 tablet 3  . metFORMIN (GLUCOPHAGE-XR) 500 MG 24 hr tablet TAKE 1 TABLET (500 MG TOTAL) BY MOUTH DAILY WITH BREAKFAST.  "OV NEEDED FOR ADDITIONAL REFILLS" 180 tablet 0  . methocarbamol (ROBAXIN)  500 MG tablet Take 1 tablet (500 mg total) by mouth 3 (three) times daily between meals as needed for muscle spasms. 40 tablet 1  . metoprolol succinate (TOPROL-XL) 25 MG 24 hr tablet Take 0.5 tablets (12.5 mg total) by mouth daily. (Patient taking differently: Take 12.5 mg by mouth every morning. ) 15 tablet 6  . potassium chloride SA (K-DUR,KLOR-CON) 20 MEQ tablet TAKE 1 TABLET BY MOUTH DAILY.  "NO MORE REFILLS WITHOUT RTC" 90 tablet 3  . simvastatin (ZOCOR) 40 MG tablet TAKE 1 TABLET BY MOUTH EVERY EVENING.  "OV NEEDED" 90 tablet 3  . telmisartan (MICARDIS) 40 MG tablet TAKE 1 TABLET BY MOUTH EVERY MORNING. 90 tablet 1   No current facility-administered medications on file prior to visit.    Review of Systems:  As per HPI- otherwise negative.   Physical Examination: There were no vitals filed for this visit. Filed Vitals:   06/05/15 0836  Height: 5' 1.5" (1.562 m)  Weight: 225 lb (102.059 kg)   Body mass index is 41.83 kg/(m^2). Ideal Body Weight: Weight in (lb) to have BMI = 25: 134.2  GEN: WDWN, NAD, Non-toxic, A & O x 3, obese, looks well HEENT: Atraumatic, Normocephalic. Neck supple. No masses, No LAD.  Bilateral TM wnl, oropharynx normal.  PEERL,EOMI.   Ears and Nose: No external deformity. CV: RRR, No M/G/R. No JVD. No thrill. No extra heart sounds. PULM: CTA B, no wheezes, crackles, rhonchi. No retractions. No resp. distress. No accessory muscle use. ABD: S, NT, ND, +BS. No rebound. No HSM. EXTR: No c/c/e NEURO Normal gait.   PSYCH: Normally interactive. Conversant. Not depressed or anxious appearing.  Calm demeanor.  Breast: normal exam, no masses/ dimpling/ discharge   Assessment and Plan: Physical exam  Controlled type 2 diabetes mellitus without complication, without long-term current use of insulin (HCC) - Plan: Comprehensive metabolic panel, Hemoglobin A1c  OSA (obstructive sleep apnea)  Hyperlipidemia - Plan: Lipid panel  History of coronary artery stent placement  Screening for deficiency anemia - Plan: CBC  Obesity  Here today for a CPE Plans for a mammo this year- OW screening is UTD Continues to see cardiology for her history of stent Continue plavix and lipid control DM is generally under good control- continue medication and check A1c today Encouraged her to work on weight loss- she plans to do so  Signed Abbe Amsterdam, MD

## 2015-06-08 ENCOUNTER — Other Ambulatory Visit: Payer: Self-pay | Admitting: *Deleted

## 2015-06-08 MED ORDER — FAMOTIDINE 20 MG PO TABS: 20.0000 mg | ORAL_TABLET | Freq: Two times a day (BID) | ORAL | Status: DC

## 2015-06-08 MED ORDER — TELMISARTAN 40 MG PO TABS: 40.0000 mg | ORAL_TABLET | Freq: Every morning | ORAL | Status: DC

## 2015-06-08 MED FILL — TELMISARTAN 40 MG TABLET: 40 | 90 days supply | Qty: 90 | Fill #0

## 2015-06-08 MED FILL — FAMOTIDINE 20 MG TABLET: 20 | 90 days supply | Qty: 180 | Fill #0

## 2015-06-08 NOTE — Telephone Encounter (Signed)
REFILL 

## 2015-06-08 NOTE — Telephone Encounter (Signed)
Refill requests left on vm. Patient stated that the pharmacy claims to have submitted multiple requests for these to be refilled.

## 2015-06-13 ENCOUNTER — Ambulatory Visit
Admission: RE | Admit: 2015-06-13 | Discharge: 2015-06-13 | Disposition: A | Discharge status: Home / Self Care | Payer: PPO | Source: Ambulatory Visit

## 2015-06-13 DIAGNOSIS — Z1231 Encounter for screening mammogram for malignant neoplasm of breast: Secondary | ICD-10-CM | POA: Diagnosis not present

## 2015-06-29 ENCOUNTER — Other Ambulatory Visit: Payer: Self-pay | Admitting: Internal Medicine

## 2015-06-29 MED FILL — METOPROLOL SUCC ER 25 MG TA: 25 | 30 days supply | Qty: 15 | Fill #0

## 2015-06-29 NOTE — Telephone Encounter (Signed)
Rx(s) sent to pharmacy electronically.  

## 2015-07-11 ENCOUNTER — Telehealth: Payer: Self-pay

## 2015-07-11 ENCOUNTER — Other Ambulatory Visit: Payer: Self-pay | Admitting: Internal Medicine

## 2015-07-11 NOTE — Telephone Encounter (Signed)
Patient called to say she has not been taking her plavix for months now. I explained a refill would have to come from nurse and she needed to call main number back and press zero and ask for nurse

## 2015-07-11 NOTE — Telephone Encounter (Signed)
New message      Pt c/o medication issue:  1. Name of Medication: plavix 2. How are you currently taking this medication (dosage and times per day)? 75mg  3. Are you having a reaction (difficulty breathing--STAT)? no 4. What is your medication issue?  Pt just realized she has not been taking her plavix.  She has no idea how long she has been without it.  What should she do?

## 2015-07-12 MED ORDER — CLOPIDOGREL BISULFATE 75 MG PO TABS: 75.0000 mg | ORAL_TABLET | Freq: Every day | ORAL | Status: DC

## 2015-07-12 MED FILL — CLOPIDOGREL 75 MG TABLET: 75 | 30 days supply | Qty: 30 | Fill #0

## 2015-07-12 NOTE — Addendum Note (Signed)
Addended by: Tobin ChadMARTIN, Sharie Amorin V. on: 07/12/2015 11:11 AM   Modules accepted: Orders

## 2015-07-12 NOTE — Telephone Encounter (Signed)
SPOKE TO PATIENT  SHE CAN NOT REMEMBER WHEN SHE LAST TOOK THE MEDICATIONS  RN INFORMED PATIENT E-SENT [facility] TO [facility] AN APPOINTMENT MADE FOR DR HILTY 07/13/15

## 2015-07-13 ENCOUNTER — Ambulatory Visit (INDEPENDENT_AMBULATORY_CARE_PROVIDER_SITE_OTHER): Payer: PPO | Admitting: Internal Medicine

## 2015-07-13 ENCOUNTER — Encounter: Payer: Self-pay | Admitting: Internal Medicine

## 2015-07-13 VITALS — BP 132/56 | HR 64 | Ht 61.5 in | Wt 218.5 lb

## 2015-07-13 DIAGNOSIS — M5126 Other intervertebral disc displacement, lumbar region: Secondary | ICD-10-CM | POA: Diagnosis not present

## 2015-07-13 DIAGNOSIS — I10 Essential (primary) hypertension: Secondary | ICD-10-CM | POA: Diagnosis not present

## 2015-07-13 DIAGNOSIS — I2583 Coronary atherosclerosis due to lipid rich plaque: Principal | ICD-10-CM

## 2015-07-13 DIAGNOSIS — I2510 Atherosclerotic heart disease of native coronary artery without angina pectoris: Secondary | ICD-10-CM

## 2015-07-13 MED ORDER — CLOPIDOGREL BISULFATE 75 MG PO TABS: 75.0000 mg | ORAL_TABLET | Freq: Every day | ORAL | Status: DC

## 2015-07-13 NOTE — Progress Notes (Signed)
Date:  07/13/2015   ID:  [name], DOB April 21, 1950, MRN [id number]  PCP:  Abbe AmsterdamOPLAND,JESSICA, MD  Primary Cardiologist:  Hilty  CC: Low back pain   History of Present Illness: [name] is a 65 y.o. female with a history of CAD with DES stent to prox. LAD in 2010 with residual disease in LCX 10-20% and RCA 20% lesion.  She also has HTN, DM, dyslipidemia and obesity.  She presented on 10/01/12 to the ED with c/o chest pressure. Discomfort began at work and associated with flushing symptoms secondary to niacin. No DOE, SOB, nausea or diaphoresis. She then developed chest pressure and nausea and went to Urgent care. She thought the flushing was secondary to stopping ASA. She was given ASA, cooled down and then had chest presssure. [facility] and NTG SL did resolve the pressure. She was very concerned about the pressure because it was the same that she had with her Lexiscan in 2010 that lead to her stent.  Coronary angiogram revealed widely patent proximal LAD stent with otherwise normal coronary arteries and normal LV function.  [name] recently underwent back surgery and is doing fairly well. She still is having some back pain but is adamant about not taking medication. She plans to go back to work on Monday next week. Unfortunately she is still grieving from her brother who passed away last week. He had stage IV pancreatic cancer and lived less than one year.  [name] returns today for follow-up. Overall she denies any chest pain or worsening shortness of breath. Recently she's been having some palpitations, however she attributes that to stress. She recently is been placed on CPAP and seems to be tolerating that well. She's had some problems with memory loss but neurologic workup is been unremarkable. She recently had worsening back problems and has been to see Dr. Shelle IronBeane. He is considering surgery on her back but would need her to come off of her antiplatelets medications.  At the  pleasure seeing [name] back today in follow-up. She underwent successful back surgery and is doing better. She's managed to lose significant amount of weight. She's remained on Toprol which was started preoperatively. Her surgery was without complication. Recently she's had some dizziness with change in position. This could be related to dietary changes however I did note that her diastolic pressure is low today at 56. She denies any chest pain or worsening shortness of breath.  Wt Readings from Last 3 Encounters:  07/13/15 218 lb 8 oz (99.111 kg)  06/05/15 225 lb (102.059 kg)  01/18/15 218 lb 6.4 oz (99.066 kg)     Past Medical History  Diagnosis Date  . Anemia   . Diabetes mellitus without complication (HCC)   . Hypertension   . Hyperlipidemia LDL goal < 70   . CAD (coronary artery disease)     PCI to LAD in 2010, mild residual disease in LCX and RCA  . GERD (gastroesophageal reflux disease)   . Plantar fascia rupture     Left Foot  . Lumbar back pain   . Family history of adverse reaction to anesthesia     pts mother had difficulty awakening   . Anginal pain (HCC)     pt relates to stress pt was seen by Dr Rennis GoldenHilty 3 wks ago was given metoprolol   . Sleep apnea     uses CPAP   . Bronchitis     hx of   .  Numbness     left leg and foot   . Cataracts, bilateral     Current Outpatient Prescriptions  Medication Sig Dispense Refill  . aspirin EC 81 MG tablet Take 1 tablet (81 mg total) by mouth daily. Resume 5 days post-op    . cetirizine (ZYRTEC) 10 MG tablet Take 1 tablet (10 mg total) by mouth daily. (Patient taking differently: Take 10 mg by mouth daily as needed for allergies. ) 30 tablet 3  . cholecalciferol (VITAMIN D) 1000 UNITS tablet Take 1,000 Units by mouth daily.    . clopidogrel (PLAVIX) 75 MG tablet Take 1 tablet (75 mg total) by mouth daily. 90 tablet 3  . diazepam (VALIUM) 2 MG tablet Take 1 tablet (2 mg total) by mouth every 8 (eight) hours as needed. For muscle  spasm 30 tablet 0  . docusate sodium (COLACE) 100 MG capsule Take 1 capsule (100 mg total) by mouth 2 (two) times daily as needed for mild constipation. 20 capsule 1  . famotidine (PEPCID) 20 MG tablet Take 1 tablet (20 mg total) by mouth 2 (two) times daily. 180 tablet 0  . fluticasone (FLONASE) 50 MCG/ACT nasal spray Place 2 sprays into both nostrils daily. Take two sprays in each nostril daily. (Patient taking differently: Place 2 sprays into both nostrils daily as needed for allergies. Take two sprays in each nostril daily.) 16 g 12  . hydrochlorothiazide (HYDRODIURIL) 25 MG tablet Take 12.5 mg by mouth daily.    . metFORMIN (GLUCOPHAGE-XR) 500 MG 24 hr tablet TAKE 1 TABLET (500 MG TOTAL) BY MOUTH DAILY WITH BREAKFAST.  "OV NEEDED FOR ADDITIONAL REFILLS" 180 tablet 0  . methocarbamol (ROBAXIN) 500 MG tablet Take 1 tablet (500 mg total) by mouth 3 (three) times daily between meals as needed for muscle spasms. 40 tablet 1  . metoprolol succinate (TOPROL-XL) 25 MG 24 hr tablet TAKE 1/2 TABLET BY MOUTH DAILY 15 tablet 3  . potassium chloride SA (K-DUR,KLOR-CON) 20 MEQ tablet TAKE 1 TABLET BY MOUTH DAILY.  "NO MORE REFILLS WITHOUT RTC" 90 tablet 3  . simvastatin (ZOCOR) 40 MG tablet TAKE 1 TABLET BY MOUTH EVERY EVENING.  "OV NEEDED" 90 tablet 3  . telmisartan (MICARDIS) 40 MG tablet Take 1 tablet (40 mg total) by mouth every morning. 90 tablet 0   No current facility-administered medications for this visit.    Allergies:    Allergies  Allergen Reactions  . Niacin And Related Other (See Comments)    Whelps and skin flushed, mouth tingling  . Tolectin [Tolmetin] Rash    Social History:  The patient  reports that she has never smoked. She has never used smokeless tobacco. She reports that she drinks alcohol. She reports that she does not use illicit drugs.   Family history:   Family History  Problem Relation Age of Onset  . Coronary artery disease Mother   . Rheum arthritis Mother   .  Dementia Mother   . Heart attack Father   . Hypertension Father   . Hyperlipidemia Father   . Other Father     MVA  . Hypertension Brother   . Cancer Brother   . Heart disease Brother   . Cancer Paternal Grandmother     stomach  . Diabetes Paternal Grandfather     ROS:  Please see the history of present illness.  All other systems reviewed and negative.   PHYSICAL EXAM: VS:  BP 132/56 mmHg  Pulse 64  Ht 5' 1.5" (1.562  m)  Wt 218 lb 8 oz (99.111 kg)  BMI 40.62 kg/m2 Well nourished, well developed, in no acute distress HEENT: Pupils are equal round react to light accommodation extraocular movements are intact.  Cardiac: Regular rate and rhythm without murmurs rubs or gallops. Lungs:  clear to auscultation bilaterally, no wheezing, rhonchi or rales Ext: 1+ lower extremity edema.  2+ radial and dorsalis pedis pulses. Skin: warm and dry Neuro:  Grossly normal Psych: Mood, affect normal  ASSESSMENT AND PLAN:  Problem List Items Addressed This Visit    HTN (hypertension)   Relevant Medications   hydrochlorothiazide (HYDRODIURIL) 25 MG tablet   CAD (coronary artery disease)PCI to LAD in 2010, mild residual disease in LCX and RCA - Primary   Relevant Medications   hydrochlorothiazide (HYDRODIURIL) 25 MG tablet   Obesity, morbid, BMI 40.0-49.9 (HCC)   HNP (herniated nucleus pulposus), lumbar     EKG: Normal sinus rhythm at 65  PLAN:  1.  [name] is doing well without any chest pain. She tolerated surgery without problems. I'm pleased to see that she's losing weight. She's had a slight amount of positional dizziness which could be related to low diastolic pressure. With her weight loss I recommend decreasing her HCTZ to 12.5 mg daily. I also advised her to get a blood pressure cuff to monitor her blood pressures at home. Finally will restart her clopidogrel as she ran out of this medication. She's not having active anginal symptoms but did have residual coronary disease and  would benefit from long-term dual antiplatelet therapy.  Follow-up with me in 6 months.  Chrystie Nose, MD, [facility] Attending Cardiologist [facility]

## 2015-07-13 NOTE — Patient Instructions (Addendum)
Your physician wants you to follow-up in: 6 months with Dr. Rennis GoldenHilty. You will receive a reminder letter in the mail two months in advance. If you don't receive a letter, please call our office to schedule the follow-up appointment.  Your physician has recommended you make the following change in your medication: DECREASE hydrochlorothiazide to 12.5mg  daily  Dr. Rennis GoldenHilty recommends an OMRON blood pressure (arm) cuff

## 2015-07-27 DIAGNOSIS — E119 Type 2 diabetes mellitus without complications: Secondary | ICD-10-CM | POA: Diagnosis not present

## 2015-07-27 DIAGNOSIS — H2513 Age-related nuclear cataract, bilateral: Secondary | ICD-10-CM | POA: Diagnosis not present

## 2015-07-27 DIAGNOSIS — H2512 Age-related nuclear cataract, left eye: Secondary | ICD-10-CM | POA: Diagnosis not present

## 2015-07-27 LAB — HM DIABETES EYE EXAM

## 2015-07-27 MED FILL — OFLOXACIN 0.3% EYE DROPS: 0.3 | 30 days supply | Qty: 5 | Fill #0

## 2015-08-02 ENCOUNTER — Ambulatory Visit: Payer: 59 | Admitting: Neurology

## 2015-08-07 MED FILL — METOPROLOL SUCC ER 25 MG TA: 25 | 30 days supply | Qty: 15 | Fill #1

## 2015-08-10 MED FILL — CLOPIDOGREL 75 MG TABLET: 75 | 90 days supply | Qty: 90 | Fill #0

## 2015-08-28 MED FILL — PROLENSA 0.07% EYE DROPS: 0.07 | 56 days supply | Qty: 3 | Fill #0

## 2015-08-30 DIAGNOSIS — H2511 Age-related nuclear cataract, right eye: Secondary | ICD-10-CM | POA: Diagnosis not present

## 2015-08-30 DIAGNOSIS — H2512 Age-related nuclear cataract, left eye: Secondary | ICD-10-CM | POA: Diagnosis not present

## 2015-09-05 ENCOUNTER — Other Ambulatory Visit: Payer: Self-pay | Admitting: Family Medicine

## 2015-09-05 MED FILL — METOPROLOL SUCC ER 25 MG TA: 25 | 30 days supply | Qty: 15 | Fill #2

## 2015-09-05 MED FILL — METFORMIN HCL ER 500 MG TAB: 500 | 90 days supply | Qty: 90 | Fill #0

## 2015-09-06 DIAGNOSIS — H25 Age-related cataract: Secondary | ICD-10-CM | POA: Diagnosis not present

## 2015-09-06 DIAGNOSIS — H2511 Age-related nuclear cataract, right eye: Secondary | ICD-10-CM | POA: Diagnosis not present

## 2015-09-13 MED FILL — SIMVASTATIN 40 MG TABLET: 40 | 90 days supply | Qty: 90 | Fill #3

## 2015-09-29 MED FILL — KLOR-CON M20 TABLET: 20 | 90 days supply | Qty: 90 | Fill #2

## 2015-10-02 ENCOUNTER — Encounter: Payer: Self-pay | Admitting: Family Medicine

## 2015-10-02 ENCOUNTER — Ambulatory Visit (INDEPENDENT_AMBULATORY_CARE_PROVIDER_SITE_OTHER): Payer: PPO | Admitting: Family Medicine

## 2015-10-02 VITALS — BP 135/57 | HR 68 | Temp 97.7°F | Ht 62.0 in | Wt 215.8 lb

## 2015-10-02 DIAGNOSIS — I10 Essential (primary) hypertension: Secondary | ICD-10-CM | POA: Diagnosis not present

## 2015-10-02 DIAGNOSIS — E119 Type 2 diabetes mellitus without complications: Secondary | ICD-10-CM | POA: Diagnosis not present

## 2015-10-02 DIAGNOSIS — G4733 Obstructive sleep apnea (adult) (pediatric): Secondary | ICD-10-CM

## 2015-10-02 DIAGNOSIS — D72829 Elevated white blood cell count, unspecified: Secondary | ICD-10-CM

## 2015-10-02 DIAGNOSIS — E2839 Other primary ovarian failure: Secondary | ICD-10-CM

## 2015-10-02 LAB — BASIC METABOLIC PANEL
BUN: 20 mg/dL (ref 6–23)
CHLORIDE: 104 meq/L (ref 96–112)
CO2: 28 meq/L (ref 19–32)
CREATININE: 0.86 mg/dL (ref 0.40–1.20)
Calcium: 10.3 mg/dL (ref 8.4–10.5)
GFR: 70.18 mL/min (ref >60.00)
Glucose, Bld: 137 mg/dL — ABNORMAL HIGH (ref 70–99)
POTASSIUM: 3.9 meq/L (ref 3.5–5.1)
SODIUM: 140 meq/L (ref 135–145)

## 2015-10-02 LAB — HEMOGLOBIN A1C: HEMOGLOBIN A1C: 6.7 % — AB (ref 4.6–6.5)

## 2015-10-02 LAB — CBC
HEMATOCRIT: 40.5 % (ref 36.0–46.0)
Hemoglobin: 13.3 g/dL (ref 12.0–15.0)
MCHC: 32.8 g/dL (ref 30.0–36.0)
MCV: 78.2 fl (ref 78.0–100.0)
Platelets: 315 10*3/uL (ref 150.0–400.0)
RBC: 5.18 Mil/uL — AB (ref 3.87–5.11)
RDW: 14.3 % (ref 11.5–15.5)
WBC: 10.8 10*3/uL — ABNORMAL HIGH (ref 4.0–10.5)

## 2015-10-02 NOTE — Progress Notes (Signed)
[facility] [place] [address], [address] [place], [place] [address] [phone] [phone]  Date:  10/02/2015   Name:  [name] PeonHelen J Gutzmer   DOB:  05-Nov-1950   MRN:  [id number]  PCP:  Abbe AmsterdamOPLAND,York Valliant, MD    Chief Complaint: Follow-up   History of Present Illness:  [name] is a 65 y.o. very pleasant female patient who presents with the following:  History of DM, CAD with history of PCI, obesity, OSA, HTN, spinal stenosis and back pain.   She did have back surgery a year ago and is doing better, she is able to exercise more.  She likes to walk at [employer] so she can lean over on a cart.   She has lost some weight and hopes that her A1c will be back in the 6s.   Her HCTZ was decreased to 12.5 by Dr. Rennis GoldenHIlty in April due to low diastolic BP.  She will feel dizzy/ lightheaded some of the time. She checked her BP when she was quite dizzy a couple of weeks ago and her BP was 113/ 56.   Her highest BP at home may run 130/ upper 50s- 62.   She has started decreasing her kdur to 10 meq since she cut back on her HCTZ.  Her sx are a bit better with dose decrease but not resolved as of yet  She did have recent cataract surgery.   Mild elevation of WBC count at last labs  Stopped using CPAP as insurance is not covering any longer.  However she is feeling rested in the am, not needing naps .   Wt Readings from Last 3 Encounters:  10/02/15 215 lb 12.8 oz (97.886 kg)  07/13/15 218 lb 8 oz (99.111 kg)  06/05/15 225 lb (102.059 kg)   Lab Results  Component Value Date   HGBA1C 7.2* 06/05/2015   Last dexa 5 years ago, would like to repeat now  Patient Active Problem List   Diagnosis Date Noted  . HNP (herniated nucleus pulposus), lumbar 10/12/2014  . Spinal stenosis at L4-L5 level 10/12/2014  . Obesity, Class III, BMI 40-49.9 (morbid obesity) (HCC) 07/29/2014  . OSA on CPAP 07/29/2014  . Iron deficiency anemia 07/29/2014  . DM (diabetes mellitus) with  complications (HCC) 07/29/2014  . Environmental and seasonal allergies 04/28/2014  . OSA (obstructive sleep apnea) 02/22/2014  . Spinal stenosis, lumbar region, with neurogenic claudication 01/06/2013  . Obesity, morbid, BMI 40.0-49.9 (HCC) 10/20/2012  . Chest pain 10/02/2012  . CAD (coronary artery disease)PCI to LAD in 2010, mild residual disease in LCX and RCA   . Hyperlipidemia with target LDL less than 70   . Diabetes mellitus, type 2 (HCC) 04/16/2012  . HTN (hypertension) 04/16/2012    Past Medical History  Diagnosis Date  . Anemia   . Diabetes mellitus without complication (HCC)   . Hypertension   . Hyperlipidemia LDL goal < 70   . CAD (coronary artery disease)     PCI to LAD in 2010, mild residual disease in LCX and RCA  . GERD (gastroesophageal reflux disease)   . Plantar fascia rupture     Left Foot  . Lumbar back pain   . Family history of adverse reaction to anesthesia     pts mother had difficulty awakening   . Anginal pain (HCC)     pt relates to stress pt was seen by Dr Rennis GoldenHilty 3 wks ago was given metoprolol   .  Sleep apnea     uses CPAP   . Bronchitis     hx of   . Numbness     left leg and foot   . Cataracts, bilateral     Past Surgical History  Procedure Laterality Date  . Tympanoplasty Bilateral   . Carotid stent  2009    pt denies   . Doppler echocardiography  08/01/2009    EF=>55%,LV normal  . Nm myocar perf wall motion  09/22/2008    lexiscan-EF 83%; glogal LV systolic fx is norm. ,evidence of mild ischemia basal anterior,midanterior and apical lateral region(s).   . Lower arterial duplex  06/20/10    abi's normal,rgt 0.98,lft 1.06;bilateral PVRs normal  . Abdominal hysterectomy    . Coronary angioplasty with stent placement  2010and 10-02-2012    Stent DES, Xience to prox. LAD  . Lumbar laminectomy/decompression microdiscectomy Left 01/06/2013    Procedure: MICRO LUMBAR DECOMPRESSION L4-5 AND L5-S1;  Surgeon: Javier Docker, MD;  Location: [facility]  ORS;  Service: Orthopedics;  Laterality: Left;  . Left heart catheterization with coronary angiogram N/A 10/02/2012    Procedure: LEFT HEART CATHETERIZATION WITH CORONARY ANGIOGRAM;  Surgeon: Runell Gess, MD;  Location: [facility] CATH LAB;  Service: Cardiovascular;  Laterality: N/A;  . Uvulopalatopharyngoplasty      pt denies   . Tubal ligation    . Lumbar laminectomy/decompression microdiscectomy Left 10/12/2014    Procedure: REVISION MICRO LUMBAR/DECOMPRESSION L4-5 LEFT ;  Surgeon: Jene Every, MD;  Location: [facility] ORS;  Service: Orthopedics;  Laterality: Left;    Social History  Substance Use Topics  . Smoking status: Never Smoker   . Smokeless tobacco: Never Used  . Alcohol Use: Yes     Comment: occasional, 1 a month wine    Family History  Problem Relation Age of Onset  . Coronary artery disease Mother   . Rheum arthritis Mother   . Dementia Mother   . Heart attack Father   . Hypertension Father   . Hyperlipidemia Father   . Other Father     MVA  . Hypertension Brother   . Cancer Brother   . Heart disease Brother   . Cancer Paternal Grandmother     stomach  . Diabetes Paternal Grandfather     Allergies  Allergen Reactions  . Niacin And Related Other (See Comments)    Whelps and skin flushed, mouth tingling  . Tolectin [Tolmetin] Rash    Medication list has been reviewed and updated.  Current Outpatient Prescriptions on File Prior to Visit  Medication Sig Dispense Refill  . aspirin EC 81 MG tablet Take 1 tablet (81 mg total) by mouth daily. Resume 5 days post-op    . cetirizine (ZYRTEC) 10 MG tablet Take 1 tablet (10 mg total) by mouth daily. (Patient taking differently: Take 10 mg by mouth daily as needed for allergies. ) 30 tablet 3  . cholecalciferol (VITAMIN D) 1000 UNITS tablet Take 1,000 Units by mouth daily.    . clopidogrel (PLAVIX) 75 MG tablet Take 1 tablet (75 mg total) by mouth daily. 90 tablet 3  . diazepam (VALIUM) 2 MG tablet Take 1 tablet (2 mg total)  by mouth every 8 (eight) hours as needed. For muscle spasm 30 tablet 0  . docusate sodium (COLACE) 100 MG capsule Take 1 capsule (100 mg total) by mouth 2 (two) times daily as needed for mild constipation. 20 capsule 1  . famotidine (PEPCID) 20 MG tablet Take 1 tablet (20 mg  total) by mouth 2 (two) times daily. 180 tablet 0  . fluticasone (FLONASE) 50 MCG/ACT nasal spray Place 2 sprays into both nostrils daily. Take two sprays in each nostril daily. (Patient taking differently: Place 2 sprays into both nostrils daily as needed for allergies. Take two sprays in each nostril daily.) 16 g 12  . hydrochlorothiazide (HYDRODIURIL) 25 MG tablet Take 12.5 mg by mouth daily.    . metFORMIN (GLUCOPHAGE-XR) 500 MG 24 hr tablet TAKE 1 TABLET BY MOUTH DAILY WITH BREAKFAST. 180 tablet 2  . methocarbamol (ROBAXIN) 500 MG tablet Take 1 tablet (500 mg total) by mouth 3 (three) times daily between meals as needed for muscle spasms. 40 tablet 1  . metoprolol succinate (TOPROL-XL) 25 MG 24 hr tablet TAKE 1/2 TABLET BY MOUTH DAILY 15 tablet 3  . potassium chloride SA (K-DUR,KLOR-CON) 20 MEQ tablet TAKE 1 TABLET BY MOUTH DAILY.  "NO MORE REFILLS WITHOUT RTC" 90 tablet 3  . simvastatin (ZOCOR) 40 MG tablet TAKE 1 TABLET BY MOUTH EVERY EVENING.  "OV NEEDED" 90 tablet 3  . telmisartan (MICARDIS) 40 MG tablet Take 1 tablet (40 mg total) by mouth every morning. 90 tablet 0   No current facility-administered medications on file prior to visit.    Review of Systems:  As per HPI- otherwise negative.   Physical Examination: Filed Vitals:   10/02/15 0905 10/02/15 0908  BP: 157/63 135/57  Pulse: 68   Temp: 97.7 F (36.5 C)    Filed Vitals:   10/02/15 0905  Height: 5\' 2"  (1.575 m)  Weight: 215 lb 12.8 oz (97.886 kg)   Body mass index is 39.46 kg/(m^2). Ideal Body Weight: Weight in (lb) to have BMI = 25: 136.4  GEN: WDWN, NAD, Non-toxic, A & O x 3, obese, looks well HEENT: Atraumatic, Normocephalic. Neck supple.  No masses, No LAD.  Bilateral TM wnl, oropharynx normal.  PEERL,EOMI.   Ears and Nose: No external deformity. CV: RRR, No M/G/R. No JVD. No thrill. No extra heart sounds. PULM: CTA B, no wheezes, crackles, rhonchi. No retractions. No resp. distress. No accessory muscle use. EXTR: No c/c/e NEURO Normal gait.  PSYCH: Normally interactive. Conversant. Not depressed or anxious appearing.  Calm demeanor.    Assessment and Plan: Controlled type 2 diabetes mellitus without complication, without long-term current use of insulin (HCC) - Plan: Hemoglobin A1c, Basic Metabolic Panel (BMET)  OSA (obstructive sleep apnea)  Essential hypertension  Leukocytosis - Plan: CBC  Estrogen deficiency - Plan: DG Bone Density  Follow-up visit today Will recheck her A1c, CBC, BMP She is not using her CPAP right now as insurnace is not covering it.  However she does not think her OSA is causing her any current sx  Try stopping your HCTZ- if you are not taking HCTZ you do not need to use the potassium.   If your BP is going higher than 140/ 85 let me know and I can call in a lower dose of HCTZ (6.25 mg) I will be in touch with your labs asap Great job with your weight loss- keep it up!   I'll schedule a bone density exam for you  Signed Abbe Amsterdam, MD

## 2015-10-02 NOTE — Patient Instructions (Addendum)
Try stopping your HCTZ- if you are not taking HCTZ you do not need to use the potassium.   If your BP is going higher than 140/ 85 let me know and I can call in a lower dose of HCTZ (6.25 mg) I will be in touch with your labs asap Great job with your weight loss- keep it up!   I'll schedule a bone density exam for you

## 2015-10-02 NOTE — Progress Notes (Signed)
Pre visit review using our clinic review tool, if applicable. No additional management support is needed unless otherwise documented below in the visit note. 

## 2015-10-03 ENCOUNTER — Telehealth: Payer: Self-pay | Admitting: Family Medicine

## 2015-10-03 DIAGNOSIS — E2839 Other primary ovarian failure: Secondary | ICD-10-CM

## 2015-10-03 NOTE — Telephone Encounter (Signed)
-----   Message from Oneal GroutJennifer S Sebastian sent at 10/03/2015  8:15 AM EDT ----- Can you please change location on order, sorry I'm unable to do that. Thanks ----- Message -----    From: Pearline CablesJessica C Journii Nierman, MD    Sent: 10/02/2015   5:46 PM      To: Samul DadaJennifer S Sebastian  [facility] is fine- thanks!  ----- Message -----    From: Oneal GroutJennifer S Sebastian    Sent: 10/02/2015   3:50 PM      To: Pearline CablesJessica C Jelene Albano, MD  [facility] no [facility] preform dexa scans, do you recommend someone else? Thanks

## 2015-10-03 NOTE — Telephone Encounter (Signed)
[facility]

## 2015-10-16 ENCOUNTER — Other Ambulatory Visit: Payer: Self-pay | Admitting: Internal Medicine

## 2015-10-16 MED FILL — TELMISARTAN 40 MG TABLET: 40 | 30 days supply | Qty: 30 | Fill #0

## 2015-10-16 MED FILL — METOPROLOL SUCC ER 25 MG TA: 25 | 30 days supply | Qty: 15 | Fill #3

## 2015-10-25 ENCOUNTER — Other Ambulatory Visit: Payer: Self-pay | Admitting: Internal Medicine

## 2015-10-25 MED FILL — FAMOTIDINE 20 MG TABLET: 20 | 90 days supply | Qty: 180 | Fill #0 | Status: TO

## 2015-10-25 NOTE — Telephone Encounter (Signed)
Rx request sent to pharmacy.  

## 2015-10-26 MED FILL — DICLOFENAC SODIUM 1% GEL: 1 | 33 days supply | Qty: 300 | Fill #0

## 2015-11-06 ENCOUNTER — Inpatient Hospital Stay: Admission: RE | Admit: 2015-11-06 | Payer: PPO | Source: Ambulatory Visit

## 2015-11-08 MED FILL — TELMISARTAN 40 MG TABLET: 40 | 30 days supply | Qty: 30 | Fill #1

## 2015-11-10 ENCOUNTER — Encounter: Payer: Self-pay | Admitting: Family Medicine

## 2015-11-10 ENCOUNTER — Ambulatory Visit
Admission: RE | Admit: 2015-11-10 | Discharge: 2015-11-10 | Disposition: A | Discharge status: Home / Self Care | Payer: PPO | Source: Ambulatory Visit | Attending: Family Medicine | Admitting: Family Medicine

## 2015-11-10 DIAGNOSIS — M8580 Other specified disorders of bone density and structure, unspecified site: Secondary | ICD-10-CM | POA: Insufficient documentation

## 2015-11-10 DIAGNOSIS — Z780 Asymptomatic menopausal state: Secondary | ICD-10-CM | POA: Diagnosis not present

## 2015-11-10 DIAGNOSIS — E2839 Other primary ovarian failure: Secondary | ICD-10-CM

## 2015-11-10 DIAGNOSIS — M8589 Other specified disorders of bone density and structure, multiple sites: Secondary | ICD-10-CM | POA: Diagnosis not present

## 2015-11-13 ENCOUNTER — Ambulatory Visit (INDEPENDENT_AMBULATORY_CARE_PROVIDER_SITE_OTHER): Payer: PPO | Admitting: Family Medicine

## 2015-11-13 ENCOUNTER — Encounter: Payer: Self-pay | Admitting: Family Medicine

## 2015-11-13 VITALS — BP 122/66 | HR 73 | Temp 97.9°F | Ht 62.0 in | Wt 215.0 lb

## 2015-11-13 DIAGNOSIS — M62838 Other muscle spasm: Secondary | ICD-10-CM | POA: Diagnosis not present

## 2015-11-13 DIAGNOSIS — H7291 Unspecified perforation of tympanic membrane, right ear: Secondary | ICD-10-CM

## 2015-11-13 MED ORDER — AMOXICILLIN-POT CLAVULANATE 875-125 MG PO TABS: 1.0000 | ORAL_TABLET | Freq: Two times a day (BID) | ORAL | 0 refills | Status: DC

## 2015-11-13 MED ORDER — DIAZEPAM 2 MG PO TABS: 2.0000 mg | ORAL_TABLET | Freq: Three times a day (TID) | ORAL | 0 refills | Status: DC | PRN

## 2015-11-13 MED ORDER — OFLOXACIN 0.3 % OT SOLN: 10.0000 [drp] | Freq: Every day | OTIC | 0 refills | Status: DC

## 2015-11-13 MED FILL — diazePAM 2 MG TABS: 2 | 10 days supply | Qty: 30 | Fill #0

## 2015-11-13 MED FILL — OFLOXACIN 0.3% EAR DROPS: 0.3 | 10 days supply | Qty: 5 | Fill #0

## 2015-11-13 MED FILL — AMOX-CLAV 875-125 MG TABLET: 875-125 | 10 days supply | Qty: 20 | Fill #0

## 2015-11-13 NOTE — Progress Notes (Signed)
[facility] [address] [place], [place] [address] [phone]  Date:  11/13/2015   Name:  [name]   DOB:  24-Jul-1950   MRN:  [id number]  PCP:  Abbe Amsterdam, MD    Chief Complaint: Sinusitis (c/o sinus drainage, right ear pain, on Saturday night right ear drained fluid. )   History of Present Illness:  [name] is a 65 y.o. very pleasant female patient who presents with the following:  Seen by myself last month to check on her DM.  She has been working on her weight and making good progress Her husband [name] has been ill with a cold- she seems to have caught it also Today is Monday- on Friday her right ear hurt a lot. Early yesterday am it started to drain and she is afraid it burst. She did not see any blood drain out but did get some green and then tan colored drainage.  This reminds her of when her left TM burst before Her hearing is muffled on the right Her left ear is at baseline.   She has noted a cough, sinus drainage, she did have a fever the day before her eardrum burst- this is now resolved.   No GI sx except for nausea.   She has not used any ear drops and has kept the ear dry  She is now off HCTZ and K- her BP looks fine today.  She has tried to keep up with her exercise.   She will use valium on rare occasion for muscle spasm in her legs and back.  Last rx was a long time ago- years Would like to have this refilled   Dr Lovey Newcomer fixed her left TM in the past   Lab Results  Component Value Date   HGBA1C 6.7 (H) 10/02/2015   Wt Readings from Last 3 Encounters:  11/13/15 215 lb (97.5 kg)  10/02/15 215 lb 12.8 oz (97.9 kg)  07/13/15 218 lb 8 oz (99.1 kg)   BP Readings from Last 3 Encounters:  11/13/15 122/66  10/02/15 (!) 135/57  07/13/15 (!) 132/56     Patient Active Problem List   Diagnosis Date Noted  . Osteopenia 11/10/2015  . HNP (herniated nucleus pulposus), lumbar  10/12/2014  . Spinal stenosis at L4-L5 level 10/12/2014  . Obesity, Class III, BMI 40-49.9 (morbid obesity) (HCC) 07/29/2014  . OSA on CPAP 07/29/2014  . Iron deficiency anemia 07/29/2014  . DM (diabetes mellitus) with complications (HCC) 07/29/2014  . Environmental and seasonal allergies 04/28/2014  . OSA (obstructive sleep apnea) 02/22/2014  . Spinal stenosis, lumbar region, with neurogenic claudication 01/06/2013  . Obesity, morbid, BMI 40.0-49.9 (HCC) 10/20/2012  . Chest pain 10/02/2012  . CAD (coronary artery disease)PCI to LAD in 2010, mild residual disease in LCX and RCA   . Hyperlipidemia with target LDL less than 70   . Diabetes mellitus, type 2 (HCC) 04/16/2012  . HTN (hypertension) 04/16/2012    Past Medical History:  Diagnosis Date  . Anemia   . Anginal pain (HCC)    pt relates to stress pt was seen by Dr Rennis Golden 3 wks ago was given metoprolol   . Bronchitis    hx of   . CAD (coronary artery disease)    PCI to LAD in 2010, mild residual disease in LCX and RCA  . Cataracts, bilateral   . Diabetes mellitus without complication (HCC)   . Family  history of adverse reaction to anesthesia    pts mother had difficulty awakening   . GERD (gastroesophageal reflux disease)   . Hyperlipidemia LDL goal < 70   . Hypertension   . Lumbar back pain   . Numbness    left leg and foot   . Plantar fascia rupture    Left Foot  . Sleep apnea    uses CPAP     Past Surgical History:  Procedure Laterality Date  . ABDOMINAL HYSTERECTOMY    . CAROTID STENT  2009   pt denies   . CORONARY ANGIOPLASTY WITH STENT PLACEMENT  2010and 10-02-2012   Stent DES, Xience to prox. LAD  . DOPPLER ECHOCARDIOGRAPHY  08/01/2009   EF=>55%,LV normal  . LEFT HEART CATHETERIZATION WITH CORONARY ANGIOGRAM N/A 10/02/2012   Procedure: LEFT HEART CATHETERIZATION WITH CORONARY ANGIOGRAM;  Surgeon: Runell Gess, MD;  Location: [facility] CATH LAB;  Service: Cardiovascular;  Laterality: N/A;  . lower arterial  duplex  06/20/10   abi's normal,rgt 0.98,lft 1.06;bilateral PVRs normal  . LUMBAR LAMINECTOMY/DECOMPRESSION MICRODISCECTOMY Left 01/06/2013   Procedure: MICRO LUMBAR DECOMPRESSION L4-5 AND L5-S1;  Surgeon: Javier Docker, MD;  Location: [facility] ORS;  Service: Orthopedics;  Laterality: Left;  . LUMBAR LAMINECTOMY/DECOMPRESSION MICRODISCECTOMY Left 10/12/2014   Procedure: REVISION MICRO LUMBAR/DECOMPRESSION L4-5 LEFT ;  Surgeon: Jene Every, MD;  Location: [facility] ORS;  Service: Orthopedics;  Laterality: Left;  . NM MYOCAR PERF WALL MOTION  09/22/2008   lexiscan-EF 83%; glogal LV systolic fx is norm. ,evidence of mild ischemia basal anterior,midanterior and apical lateral region(s).   . TUBAL LIGATION    . TYMPANOPLASTY Bilateral   . UVULOPALATOPHARYNGOPLASTY     pt denies     Social History  Substance Use Topics  . Smoking status: Never Smoker  . Smokeless tobacco: Never Used  . Alcohol use Yes     Comment: occasional, 1 a month wine    Family History  Problem Relation Age of Onset  . Coronary artery disease Mother   . Rheum arthritis Mother   . Dementia Mother   . Heart attack Father   . Hypertension Father   . Hyperlipidemia Father   . Other Father     MVA  . Hypertension Brother   . Cancer Brother   . Heart disease Brother   . Cancer Paternal Grandmother     stomach  . Diabetes Paternal Grandfather     Allergies  Allergen Reactions  . Niacin And Related Other (See Comments)    Whelps and skin flushed, mouth tingling  . Tolectin [Tolmetin] Rash    Medication list has been reviewed and updated.  Current Outpatient Prescriptions on File Prior to Visit  Medication Sig Dispense Refill  . aspirin EC 81 MG tablet Take 1 tablet (81 mg total) by mouth daily. Resume 5 days post-op    . cetirizine (ZYRTEC) 10 MG tablet Take 1 tablet (10 mg total) by mouth daily. (Patient taking differently: Take 10 mg by mouth daily as needed for allergies. ) 30 tablet 3  . cholecalciferol  (VITAMIN D) 1000 UNITS tablet Take 1,000 Units by mouth daily.    . clopidogrel (PLAVIX) 75 MG tablet Take 1 tablet (75 mg total) by mouth daily. 90 tablet 3  . diazepam (VALIUM) 2 MG tablet Take 1 tablet (2 mg total) by mouth every 8 (eight) hours as needed. For muscle spasm 30 tablet 0  . docusate sodium (COLACE) 100 MG capsule Take 1 capsule (100 mg total) by  mouth 2 (two) times daily as needed for mild constipation. 20 capsule 1  . famotidine (PEPCID) 20 MG tablet TAKE 1 TABLET BY MOUTH 2 TIMES DAILY. 180 tablet 1  . fluticasone (FLONASE) 50 MCG/ACT nasal spray Place 2 sprays into both nostrils daily. Take two sprays in each nostril daily. (Patient taking differently: Place 2 sprays into both nostrils daily as needed for allergies. Take two sprays in each nostril daily.) 16 g 12  . hydrochlorothiazide (HYDRODIURIL) 25 MG tablet Take 12.5 mg by mouth daily.    . metFORMIN (GLUCOPHAGE-XR) 500 MG 24 hr tablet TAKE 1 TABLET BY MOUTH DAILY WITH BREAKFAST. 180 tablet 2  . methocarbamol (ROBAXIN) 500 MG tablet Take 1 tablet (500 mg total) by mouth 3 (three) times daily between meals as needed for muscle spasms. 40 tablet 1  . metoprolol succinate (TOPROL-XL) 25 MG 24 hr tablet TAKE 1/2 TABLET BY MOUTH DAILY 15 tablet 3  . potassium chloride SA (K-DUR,KLOR-CON) 20 MEQ tablet TAKE 1 TABLET BY MOUTH DAILY.  "NO MORE REFILLS WITHOUT RTC" 90 tablet 3  . simvastatin (ZOCOR) 40 MG tablet TAKE 1 TABLET BY MOUTH EVERY EVENING.  "OV NEEDED" 90 tablet 3  . telmisartan (MICARDIS) 40 MG tablet TAKE 1 TABLET BY MOUTH EVERY MORNING. 90 tablet 0   No current facility-administered medications on file prior to visit.     Review of Systems:  As per HPI- otherwise negative.   Physical Examination: Vitals:   11/13/15 1553  BP: 122/66  Pulse: 73  Temp: 97.9 F (36.6 C)   Vitals:   11/13/15 1553  Weight: 215 lb (97.5 kg)  Height: 5\' 2"  (1.575 m)   Body mass index is 39.32 kg/m. Ideal Body Weight: Weight  in (lb) to have BMI = 25: 136.4  GEN: WDWN, NAD, Non-toxic, A & O x 3, obese, looks well HEENT: Atraumatic, Normocephalic. Neck supple. No masses, No LAD. Right TM is ruptured. No visible drainage in her ear canal at this time. Left TM is intact s/p repair.  Oropharynx wnl Ears and Nose: No external deformity. CV: RRR, No M/G/R. No JVD. No thrill. No extra heart sounds. PULM: CTA B, no wheezes, crackles, rhonchi. No retractions. No resp. distress. No accessory muscle use. EXTR: No c/c/e NEURO Normal gait.  PSYCH: Normally interactive. Conversant. Not depressed or anxious appearing.  Calm demeanor.    Assessment and Plan: Ruptured tympanic membrane, right - Plan: amoxicillin-clavulanate (AUGMENTIN) 875-125 MG tablet, ofloxacin (FLOXIN OTIC) 0.3 % otic solution  Muscle spasm - Plan: diazepam (VALIUM) 2 MG tablet  Here today with likely right OM that led to a TM rupture.  Start treatment with augmentin, floxin otic.  She has an ENT doctor- asked her to make an appt for about one month from now. Suspect she may need to get the TM repaired.  Water precautions discussed Refilled her diazapam that she uses for occasional muscle spasm  Meds ordered this encounter  Medications  . amoxicillin-clavulanate (AUGMENTIN) 875-125 MG tablet    Sig: Take 1 tablet by mouth 2 (two) times daily.    Dispense:  20 tablet    Refill:  0  . ofloxacin (FLOXIN OTIC) 0.3 % otic solution    Sig: Place 10 drops into the right ear daily.    Dispense:  5 mL    Refill:  0  . diazepam (VALIUM) 2 MG tablet    Sig: Take 1 tablet (2 mg total) by mouth every 8 (eight) hours as needed. For muscle  spasm    Dispense:  30 tablet    Refill:  0   See patient instructions for more details.     Signed Abbe AmsterdamJessica Ambra Haverstick, MD

## 2015-11-13 NOTE — Progress Notes (Signed)
Pre visit review using our clinic review tool, if applicable. No additional management support is needed unless otherwise documented below in the visit note. 

## 2015-11-13 NOTE — Patient Instructions (Signed)
Your right ear drum is ruptured. Use the ear drops and oral antibiotic as directed.  Keep your ear dry- no swimming and use an ear plus (gently insert into canal) before showering/ washing hair.   Please call your ENT doc and schedule a follow-up appt for the next few weeks Let me know if you have any worsening or change in your symptoms in the meantime

## 2015-11-15 ENCOUNTER — Other Ambulatory Visit: Payer: Self-pay | Admitting: Internal Medicine

## 2015-11-15 MED FILL — METOPROLOL SUCC ER 25 MG TA: 25 | 30 days supply | Qty: 15 | Fill #0

## 2015-11-15 MED FILL — CLOPIDOGREL 75 MG TABLET: 75 | 90 days supply | Qty: 90 | Fill #1 | Status: TO

## 2015-11-28 DIAGNOSIS — H9071 Mixed conductive and sensorineural hearing loss, unilateral, right ear, with unrestricted hearing on the contralateral side: Secondary | ICD-10-CM | POA: Diagnosis not present

## 2015-11-28 DIAGNOSIS — H7403 Tympanosclerosis, bilateral: Secondary | ICD-10-CM | POA: Diagnosis not present

## 2015-12-11 MED FILL — TELMISARTAN 40 MG TABLET: 40 | 30 days supply | Qty: 30 | Fill #2

## 2015-12-11 MED FILL — METFORMIN HCL ER 500 MG TAB: 500 | 90 days supply | Qty: 90 | Fill #1 | Status: TO

## 2015-12-18 MED FILL — METOPROLOL SUCC ER 25 MG TA: 25 | 30 days supply | Qty: 15 | Fill #1 | Status: TO

## 2015-12-20 ENCOUNTER — Other Ambulatory Visit: Payer: Self-pay | Admitting: Internal Medicine

## 2015-12-20 ENCOUNTER — Other Ambulatory Visit: Payer: Self-pay

## 2015-12-20 MED FILL — SIMVASTATIN 40 MG TABLET: 40 | 90 days supply | Qty: 90 | Fill #0 | Status: TO

## 2015-12-20 NOTE — Telephone Encounter (Signed)
Rx(s) sent to pharmacy electronically.  

## 2016-01-15 ENCOUNTER — Other Ambulatory Visit: Payer: Self-pay | Admitting: *Deleted

## 2016-01-15 MED ORDER — TELMISARTAN 40 MG PO TABS: 40.0000 mg | ORAL_TABLET | Freq: Every morning | ORAL | 1 refills | Status: DC

## 2016-01-15 MED FILL — TELMISARTAN 40 MG TABLET: 40 | 90 days supply | Qty: 90 | Fill #0 | Status: TO

## 2016-01-17 ENCOUNTER — Ambulatory Visit (INDEPENDENT_AMBULATORY_CARE_PROVIDER_SITE_OTHER): Payer: PPO

## 2016-01-17 ENCOUNTER — Ambulatory Visit (INDEPENDENT_AMBULATORY_CARE_PROVIDER_SITE_OTHER): Payer: PPO | Admitting: Podiatry

## 2016-01-17 ENCOUNTER — Encounter: Payer: Self-pay | Admitting: Podiatry

## 2016-01-17 ENCOUNTER — Ambulatory Visit: Payer: PPO

## 2016-01-17 VITALS — BP 131/104 | HR 57 | Resp 16 | Ht 61.5 in | Wt 215.0 lb

## 2016-01-17 DIAGNOSIS — M25572 Pain in left ankle and joints of left foot: Principal | ICD-10-CM

## 2016-01-17 DIAGNOSIS — G8929 Other chronic pain: Secondary | ICD-10-CM | POA: Diagnosis not present

## 2016-01-17 DIAGNOSIS — M25571 Pain in right ankle and joints of right foot: Secondary | ICD-10-CM

## 2016-01-17 DIAGNOSIS — M2142 Flat foot [pes planus] (acquired), left foot: Secondary | ICD-10-CM

## 2016-01-17 DIAGNOSIS — M76822 Posterior tibial tendinitis, left leg: Secondary | ICD-10-CM

## 2016-01-17 DIAGNOSIS — M79672 Pain in left foot: Secondary | ICD-10-CM

## 2016-01-17 DIAGNOSIS — M779 Enthesopathy, unspecified: Secondary | ICD-10-CM | POA: Diagnosis not present

## 2016-01-17 MED ORDER — TRIAMCINOLONE ACETONIDE 10 MG/ML IJ SUSP
10.0000 mg | Freq: Once | INTRAMUSCULAR | Status: AC
  Administered 2016-01-17: 10 mg

## 2016-01-17 NOTE — Progress Notes (Signed)
   Subjective:    Patient ID: [name], female    DOB: 1950/10/18, 65 y.o.   MRN: [id number]  HPI Chief Complaint  Patient presents with  . Ankle Pain    Left foot; medial side; pt stated, "Foot had gone into a small hole in the ground in November 2014; gets sore on and off"  . Foot Pain    Left foot; plantar; swelling; redness; "has on and off since November 2014"      Review of Systems  Musculoskeletal: Positive for gait problem.  All other systems reviewed and are negative.      Objective:   Physical Exam        Assessment & Plan:

## 2016-01-17 NOTE — Progress Notes (Signed)
Subjective:     Patient ID: [name], female   DOB: [birth year], 65 y.o.   MRN: [id number]  HPI patient presents stating she's had chronic pain in her left ankle severe flatfoot deformity left over right arthritis in the joint minimal movement and pain that seems to be intense fine with any form of activity for the last several years   Review of Systems  All other systems reviewed and are negative.      Objective:   Physical Exam  Constitutional: She is oriented to person, place, and time.  Cardiovascular: Intact distal pulses.   Musculoskeletal: Normal range of motion.  Neurological: She is oriented to person, place, and time.  Skin: Skin is warm and dry.  Nursing note and vitals reviewed.  neurovascular status was found to be intact with patient found to have significant range of motion loss subtalar joint bilateral flatfoot deformity left over right discomfort in the medial lateral ankle left and also in the dorsal ankle near the anterior tibial tendon. 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 She is scheduled for bracing  X-ray report indicates significant arthritis of the subtalar joint bilateral with significant depression of arch left over right

## 2016-01-22 DIAGNOSIS — Z961 Presence of intraocular lens: Secondary | ICD-10-CM | POA: Diagnosis not present

## 2016-01-23 MED FILL — METOPROLOL SUCC ER 25 MG TA: 25 | 30 days supply | Qty: 15 | Fill #0

## 2016-01-25 ENCOUNTER — Encounter: Payer: Self-pay | Admitting: Family Medicine

## 2016-01-25 ENCOUNTER — Ambulatory Visit (INDEPENDENT_AMBULATORY_CARE_PROVIDER_SITE_OTHER): Payer: PPO | Admitting: Family Medicine

## 2016-01-25 VITALS — BP 140/80 | HR 67 | Temp 97.8°F | Ht 61.5 in | Wt 214.0 lb

## 2016-01-25 DIAGNOSIS — S93692D Other sprain of left foot, subsequent encounter: Secondary | ICD-10-CM

## 2016-01-25 DIAGNOSIS — I10 Essential (primary) hypertension: Secondary | ICD-10-CM

## 2016-01-25 DIAGNOSIS — E119 Type 2 diabetes mellitus without complications: Secondary | ICD-10-CM | POA: Diagnosis not present

## 2016-01-25 DIAGNOSIS — H7291 Unspecified perforation of tympanic membrane, right ear: Secondary | ICD-10-CM

## 2016-01-25 DIAGNOSIS — Z955 Presence of coronary angioplasty implant and graft: Secondary | ICD-10-CM | POA: Diagnosis not present

## 2016-01-25 DIAGNOSIS — G8929 Other chronic pain: Secondary | ICD-10-CM

## 2016-01-25 DIAGNOSIS — R002 Palpitations: Secondary | ICD-10-CM | POA: Diagnosis not present

## 2016-01-25 DIAGNOSIS — M79673 Pain in unspecified foot: Secondary | ICD-10-CM

## 2016-01-25 LAB — COMPREHENSIVE METABOLIC PANEL
ALK PHOS: 113 U/L (ref 39–117)
ALT: 13 U/L (ref 0–35)
AST: 11 U/L (ref 0–37)
Albumin: 4.3 g/dL (ref 3.5–5.2)
BILIRUBIN TOTAL: 0.4 mg/dL (ref 0.2–1.2)
BUN: 19 mg/dL (ref 6–23)
CO2: 28 meq/L (ref 19–32)
CREATININE: 0.78 mg/dL (ref 0.40–1.20)
Calcium: 10.2 mg/dL (ref 8.4–10.5)
Chloride: 105 mEq/L (ref 96–112)
GFR: 78.47 mL/min (ref >60.00)
GLUCOSE: 115 mg/dL — AB (ref 70–99)
Potassium: 4.2 mEq/L (ref 3.5–5.1)
SODIUM: 141 meq/L (ref 135–145)
TOTAL PROTEIN: 7.2 g/dL (ref 6.0–8.3)

## 2016-01-25 LAB — HEMOGLOBIN A1C: HEMOGLOBIN A1C: 6.7 % — AB (ref 4.6–6.5)

## 2016-01-25 LAB — TSH: TSH: 3.11 u[IU]/mL (ref 0.35–4.50)

## 2016-01-25 MED ORDER — CELECOXIB 100 MG PO CAPS: 100.0000 mg | ORAL_CAPSULE | Freq: Two times a day (BID) | ORAL | 3 refills | Status: DC

## 2016-01-25 MED FILL — CELECOXIB 100 MG CAPSULE: 100 | 30 days supply | Qty: 60 | Fill #0

## 2016-01-25 NOTE — Progress Notes (Signed)
Pre visit review using our clinic review tool, if applicable. No additional management support is needed unless otherwise documented below in the visit note. 

## 2016-01-25 NOTE — Patient Instructions (Addendum)
It was very good to see you today!  I will be in touch with your labs asap.  I did decide to check a CMP and thyroid as well due to your palpitations. I will touch base with Dr. Rennis GoldenHilty about your celelbrex, but I think this will be ok for you to take.  Remember that it can increase your risk of bleeding when used with plavix and/ or aspirin.  I would use it when needed for your foot and ankle pain, but on days when you have less pain tylenol maybe enough and would be safer.  If you start to have stomach pain or are coughing up blood please stop the celebrex and seek care

## 2016-01-25 NOTE — Progress Notes (Signed)
[facility] [address] [place], [place] [address] [phone] [phone]  Date:  01/25/2016   Name:  Brynda PeonHelen J Kaczorowski   DOB:  1950/11/24   MRN:  [id number]  PCP:  Abbe AmsterdamOPLAND,JESSICA, MD    Chief Complaint: Follow-up (Pt here for 3 month f/u up for ruptured right tympanic membrane, pt s tates that her ear has healed up well. Pt did go see ENT as directed. Pt would like to discuss possibly starting Celebrex for arthritis.)   History of Present Illness:  Brynda PeonHelen J Heap is a 65 y.o. very pleasant female patient who presents with the following:  I saw her in August for a RIGHT OM with associated TM rupture.  Treated with abx, and we planned visit with ENT She is here for a recheck today  Wt Readings from Last 3 Encounters:  01/25/16 214 lb (97.1 kg)  01/17/16 215 lb (97.5 kg)  11/13/15 215 lb (97.5 kg)   She does not have any pain from her ear, and her hearing is back to normal. She did see Dr. Suszanne Connerseoh- he said it was healing ok, she did not need any patching or procedure.    She saw her podiatrist at [facility] last week. They did steroids in both ankles.  She is not taking anything for arthritis pain right now. She finds that swelling and pain in her feet can sometimes restrict her activities.   She does use voltaren gel at times per Dr. Shelle IronBeane She has tried some tylenol on occasion. She would like to use celebrex due to history of GERD She did have PCI in 2010 and 2014. She does not have any CP or other sx except on questioning she does admit to some heart palpitations that will occur when she is quiet and still at night.  She does not notice them during the day No change in her exercise tolerance   Lab Results  Component Value Date   HGBA1C 6.7 (H) 10/02/2015     Patient Active Problem List   Diagnosis Date Noted  . Osteopenia 11/10/2015  . HNP (herniated nucleus pulposus), lumbar 10/12/2014  . Spinal stenosis at L4-L5 level  10/12/2014  . Obesity, Class III, BMI 40-49.9 (morbid obesity) (HCC) 07/29/2014  . OSA on CPAP 07/29/2014  . Iron deficiency anemia 07/29/2014  . DM (diabetes mellitus) with complications (HCC) 07/29/2014  . Environmental and seasonal allergies 04/28/2014  . OSA (obstructive sleep apnea) 02/22/2014  . Spinal stenosis, lumbar region, with neurogenic claudication 01/06/2013  . Obesity, morbid, BMI 40.0-49.9 (HCC) 10/20/2012  . Chest pain 10/02/2012  . CAD (coronary artery disease)PCI to LAD in 2010, mild residual disease in LCX and RCA   . Hyperlipidemia with target LDL less than 70   . Diabetes mellitus, type 2 (HCC) 04/16/2012  . HTN (hypertension) 04/16/2012    Past Medical History:  Diagnosis Date  . Anemia   . Anginal pain (HCC)    pt relates to stress pt was seen by Dr Rennis GoldenHilty 3 wks ago was given metoprolol   . Bronchitis    hx of   . CAD (coronary artery disease)    PCI to LAD in 2010, mild residual disease in LCX and RCA  . Cataracts, bilateral   . Diabetes mellitus without complication (HCC)   . Family history of adverse reaction to anesthesia    pts mother had difficulty awakening   . GERD (gastroesophageal reflux disease)   .  Hyperlipidemia LDL goal < 70   . Hypertension   . Lumbar back pain   . Numbness    left leg and foot   . Plantar fascia rupture    Left Foot  . Sleep apnea    uses CPAP     Past Surgical History:  Procedure Laterality Date  . ABDOMINAL HYSTERECTOMY    . CAROTID STENT  2009   pt denies   . CORONARY ANGIOPLASTY WITH STENT PLACEMENT  2010and 10-02-2012   Stent DES, Xience to prox. LAD  . DOPPLER ECHOCARDIOGRAPHY  08/01/2009   EF=>55%,LV normal  . LEFT HEART CATHETERIZATION WITH CORONARY ANGIOGRAM N/A 10/02/2012   Procedure: LEFT HEART CATHETERIZATION WITH CORONARY ANGIOGRAM;  Surgeon: Runell Gess, MD;  Location: [facility] CATH LAB;  Service: Cardiovascular;  Laterality: N/A;  . lower arterial duplex  06/20/10   abi's normal,rgt 0.98,lft  1.06;bilateral PVRs normal  . LUMBAR LAMINECTOMY/DECOMPRESSION MICRODISCECTOMY Left 01/06/2013   Procedure: MICRO LUMBAR DECOMPRESSION L4-5 AND L5-S1;  Surgeon: Javier Docker, MD;  Location: [facility] ORS;  Service: Orthopedics;  Laterality: Left;  . LUMBAR LAMINECTOMY/DECOMPRESSION MICRODISCECTOMY Left 10/12/2014   Procedure: REVISION MICRO LUMBAR/DECOMPRESSION L4-5 LEFT ;  Surgeon: Jene Every, MD;  Location: [facility] ORS;  Service: Orthopedics;  Laterality: Left;  . NM MYOCAR PERF WALL MOTION  09/22/2008   lexiscan-EF 83%; glogal LV systolic fx is norm. ,evidence of mild ischemia basal anterior,midanterior and apical lateral region(s).   . TUBAL LIGATION    . TYMPANOPLASTY Bilateral   . UVULOPALATOPHARYNGOPLASTY     pt denies     Social History  Substance Use Topics  . Smoking status: Never Smoker  . Smokeless tobacco: Never Used  . Alcohol use Yes     Comment: occasional, 1 a month wine    Family History  Problem Relation Age of Onset  . Coronary artery disease Mother   . Rheum arthritis Mother   . Dementia Mother   . Heart attack Father   . Hypertension Father   . Hyperlipidemia Father   . Other Father     MVA  . Hypertension Brother   . Cancer Brother   . Heart disease Brother   . Cancer Paternal Grandmother     stomach  . Diabetes Paternal Grandfather     Allergies  Allergen Reactions  . Niacin And Related Other (See Comments)    Whelps and skin flushed, mouth tingling  . Tolectin [Tolmetin] Rash    Medication list has been reviewed and updated.  Current Outpatient Prescriptions on File Prior to Visit  Medication Sig Dispense Refill  . aspirin EC 81 MG tablet Take 1 tablet (81 mg total) by mouth daily. Resume 5 days post-op    . cetirizine (ZYRTEC) 10 MG tablet Take 1 tablet (10 mg total) by mouth daily. (Patient taking differently: Take 10 mg by mouth daily as needed for allergies. ) 30 tablet 3  . cholecalciferol (VITAMIN D) 1000 UNITS tablet Take 1,000 Units by  mouth daily.    . clopidogrel (PLAVIX) 75 MG tablet Take 1 tablet (75 mg total) by mouth daily. 90 tablet 3  . diazepam (VALIUM) 2 MG tablet Take 1 tablet (2 mg total) by mouth every 8 (eight) hours as needed. For muscle spasm 30 tablet 0  . docusate sodium (COLACE) 100 MG capsule Take 1 capsule (100 mg total) by mouth 2 (two) times daily as needed for mild constipation. 20 capsule 1  . famotidine (PEPCID) 20 MG tablet TAKE 1 TABLET BY  MOUTH 2 TIMES DAILY. 180 tablet 1  . fluticasone (FLONASE) 50 MCG/ACT nasal spray Place 2 sprays into both nostrils daily. Take two sprays in each nostril daily. (Patient taking differently: Place 2 sprays into both nostrils daily as needed for allergies. Take two sprays in each nostril daily.) 16 g 12  . metFORMIN (GLUCOPHAGE-XR) 500 MG 24 hr tablet TAKE 1 TABLET BY MOUTH DAILY WITH BREAKFAST. 180 tablet 2  . methocarbamol (ROBAXIN) 500 MG tablet Take 1 tablet (500 mg total) by mouth 3 (three) times daily between meals as needed for muscle spasms. 40 tablet 1  . metoprolol succinate (TOPROL-XL) 25 MG 24 hr tablet TAKE 1/2 TABLET BY MOUTH DAILY 15 tablet 5  . simvastatin (ZOCOR) 40 MG tablet TAKE 1 TABLET BY MOUTH EVERY EVENING. 90 tablet 2  . telmisartan (MICARDIS) 40 MG tablet Take 1 tablet (40 mg total) by mouth every morning. 90 tablet 1   No current facility-administered medications on file prior to visit.     Review of Systems:  As per HPI- otherwise negative.   Physical Examination: Vitals:   01/25/16 0855  BP: 140/80  Pulse: 67  Temp: 97.8 F (36.6 C)   Vitals:   01/25/16 0855  Weight: 214 lb (97.1 kg)  Height: 5' 1.5" (1.562 m)   Body mass index is 39.78 kg/m. Ideal Body Weight: Weight in (lb) to have BMI = 25: 134.2  GEN: WDWN, NAD, Non-toxic, A & O x 3, obese, looks well HEENT: Atraumatic, Normocephalic. Neck supple. No masses, No LAD. Bilateral TM are scarred and show evidence of prior trauma but no chronic rupture is noted Ears and  Nose: No external deformity. CV: RRR, No M/G/R. No JVD. No thrill. No extra heart sounds. PULM: CTA B, no wheezes, crackles, rhonchi. No retractions. No resp. distress. No accessory muscle use. EXTR: No c/c/e NEURO Normal gait.  PSYCH: Normally interactive. Conversant. Not depressed or anxious appearing.  Calm demeanor.  She has low arches in her bilateral feet- more so in the left.  The left foot may have a mild clubfoot deformity   EKG: mild bradycardia (rate 57), otherwise normal  Assessment and Plan: Controlled type 2 diabetes mellitus without complication, without long-term current use of insulin (HCC) - Plan: Hemoglobin A1C, Comprehensive metabolic panel  Ruptured tympanic membrane, right  Essential hypertension - Plan: TSH  History of coronary artery stent placement  Rupture of plantar fascia of left foot, subsequent encounter - Plan: celecoxib (CELEBREX) 100 MG capsule  Palpitations - Plan: EKG 12-Lead, TSH  Chronic foot pain, unspecified laterality - Plan: celecoxib (CELEBREX) 100 MG capsule  Here today with sx of palpitations - her eval is reassuring, suspect occasional PVCs.  She is seeing her cardiologist for follow-up in about 2 weeks. Will also check her lytes and TSH She would like to try celebrex for her foot pain which is significant.  Discussed R/B/A with pt. I think that the potential benefits are greater than the risks for this particular pt.  Will also touch base with her cardiologist to make sure he is in agreement.   Healed TM rupture  Signed Abbe AmsterdamJessica Copland, MD

## 2016-01-26 ENCOUNTER — Encounter: Payer: Self-pay | Admitting: Family Medicine

## 2016-01-30 ENCOUNTER — Telehealth: Payer: Self-pay | Admitting: Family Medicine

## 2016-01-30 NOTE — Telephone Encounter (Signed)
-----   Message from Chrystie NoseKenneth C Hilty, MD sent at 01/29/2016  9:51 AM EST ----- Probably ok, as long as she knows it can increase bleeding risk. Would try to avoid daily use, Prn 2-3x week is ok.  Thanks.  [place]  ----- Message ----- From: Pearline CablesJessica C Copland, MD Sent: 01/25/2016   9:44 AM To: Chrystie NoseKenneth C Hilty, MD  Hello- I saw [name] today for a recheck visit.  As you may recall, she had a PCI back in 2010. She also has significant foot and ankle problems that can limit her activities due to pain.  I gave her some celebrex to use as needed - let me know if you would prefer that she NOT use this.   Thanks and warm regards Jess Copland

## 2016-01-30 NOTE — Telephone Encounter (Signed)
Called her to go over Dr. Blanchie DessertHilty's email response regarding her celebrex

## 2016-02-12 ENCOUNTER — Ambulatory Visit (INDEPENDENT_AMBULATORY_CARE_PROVIDER_SITE_OTHER): Payer: PPO | Admitting: Internal Medicine

## 2016-02-12 ENCOUNTER — Encounter (INDEPENDENT_AMBULATORY_CARE_PROVIDER_SITE_OTHER): Payer: PPO

## 2016-02-12 ENCOUNTER — Encounter: Payer: Self-pay | Admitting: Internal Medicine

## 2016-02-12 VITALS — BP 130/64 | HR 60 | Ht 61.5 in | Wt 215.0 lb

## 2016-02-12 DIAGNOSIS — I2510 Atherosclerotic heart disease of native coronary artery without angina pectoris: Secondary | ICD-10-CM | POA: Diagnosis not present

## 2016-02-12 DIAGNOSIS — I10 Essential (primary) hypertension: Secondary | ICD-10-CM

## 2016-02-12 DIAGNOSIS — R002 Palpitations: Secondary | ICD-10-CM

## 2016-02-12 DIAGNOSIS — Z9989 Dependence on other enabling machines and devices: Secondary | ICD-10-CM

## 2016-02-12 DIAGNOSIS — G4733 Obstructive sleep apnea (adult) (pediatric): Secondary | ICD-10-CM

## 2016-02-12 DIAGNOSIS — I2583 Coronary atherosclerosis due to lipid rich plaque: Secondary | ICD-10-CM

## 2016-02-12 NOTE — Patient Instructions (Addendum)
Medication Instructions:  Your physician recommends that you continue on your current medications as directed. Please refer to the Current Medication list given to you today.  Labwork: None  Testing/Procedures: Your physician has recommended that you wear an 2 week event monitor. Event monitors are medical devices that record the heart's electrical activity. Doctors most often us these monitors to diagnose arrhythmias. Arrhythmias are problems with the speed or rhythm of the heartbeat. The monitor is a small, portable device. You can wear one while you do your normal daily activities. This is usually used to diagnose what is causing palpitations/syncope (passing out).  Follow-Up: Your physician recommends that you schedule a follow-up appointment in: 1 MONTH WITH DR HILTY AFTER MONITOR.  Any Other Special Instructions Will Be Listed Below (If Applicable).  Schedule monitor at [address]  If you need a refill on your cardiac medications before your next appointment, please call your pharmacy.

## 2016-02-12 NOTE — Progress Notes (Signed)
Date:  02/12/2016   ID:  LILO WALLINGTON, DOB 07-26-1950, MRN [id number]  PCP:  Abbe Amsterdam, MD  Primary Cardiologist:  Hilty  CC: Palpitations   History of Present Illness: JARIA CONWAY is a 65 y.o. female with a history of CAD with DES stent to prox. LAD in 2010 with residual disease in LCX 10-20% and RCA 20% lesion.  She also has HTN, DM, dyslipidemia and obesity.  She presented on 10/01/12 to the ED with c/o chest pressure. Discomfort began at work and associated with flushing symptoms secondary to niacin. No DOE, SOB, nausea or diaphoresis. She then developed chest pressure and nausea and went to Urgent care. She thought the flushing was secondary to stopping ASA. She was given ASA, cooled down and then had chest presssure. Sent to [facility] ER and NTG SL did resolve the pressure. She was very concerned about the pressure because it was the same that she had with her Lexiscan in 2010 that lead to her stent.  Coronary angiogram revealed widely patent proximal LAD stent with otherwise normal coronary arteries and normal LV function.  Mrs. Amaker recently underwent back surgery and is doing fairly well. She still is having some back pain but is adamant about not taking medication. She plans to go back to work on Monday next week. Unfortunately she is still grieving from her brother who passed away last week. He had stage IV pancreatic cancer and lived less than one year.  Mrs. Foglesong returns today for follow-up. Overall she denies any chest pain or worsening shortness of breath. Recently she's been having some palpitations, however she attributes that to stress. She recently is been placed on CPAP and seems to be tolerating that well. She's had some problems with memory loss but neurologic workup is been unremarkable. She recently had worsening back problems and has been to see Dr. Shelle Iron. He is considering surgery on her back but would need her to come off of her antiplatelets medications.  At the  pleasure seeing Mrs. Huntoon back today in follow-up. She underwent successful back surgery and is doing better. She's managed to lose significant amount of weight. She's remained on Toprol which was started preoperatively. Her surgery was without complication. Recently she's had some dizziness with change in position. This could be related to dietary changes however I did note that her diastolic pressure is low today at 56. She denies any chest pain or worsening shortness of breath.  02/12/2016  Mrs. Mcdaid returns today for follow-up. She is recently been having some more palpitations. She reports being under significant stress as her daughter and granddaughter are now living with them. She gets some fullness in her chest when she has palpitations. These episodes occur 2-3 times a week over the past month or so. She denies any exertional chest pain or worsening shortness of breath. Weight is Morrie Sheldon down 3 pounds since her last office visit. Blood pressure is well-controlled. She recently started Celebrex and I spoke with her primary care provider about this. She needs to be careful about taking it regularly in the setting of aspirin and Plavix use.  Wt Readings from Last 3 Encounters:  02/12/16 215 lb (97.5 kg)  01/25/16 214 lb (97.1 kg)  01/17/16 215 lb (97.5 kg)     Past Medical History:  Diagnosis Date  . Anemia   . Anginal pain (HCC)    pt relates to stress pt was seen by Dr Rennis Golden 3 wks ago was given metoprolol   .  Bronchitis    hx of   . CAD (coronary artery disease)    PCI to LAD in 2010, mild residual disease in LCX and RCA  . Cataracts, bilateral   . Diabetes mellitus without complication (HCC)   . Family history of adverse reaction to anesthesia    pts mother had difficulty awakening   . GERD (gastroesophageal reflux disease)   . Hyperlipidemia LDL goal < 70   . Hypertension   . Lumbar back pain   . Numbness    left leg and foot   . Plantar fascia rupture    Left Foot  . Sleep  apnea    uses CPAP     Current Outpatient Prescriptions  Medication Sig Dispense Refill  . aspirin EC 81 MG tablet Take 1 tablet (81 mg total) by mouth daily. Resume 5 days post-op    . celecoxib (CELEBREX) 100 MG capsule Take 1 capsule (100 mg total) by mouth 2 (two) times daily. Use as needed for foot and ankle pain 60 capsule 3  . cetirizine (ZYRTEC) 10 MG tablet Take 1 tablet (10 mg total) by mouth daily. (Patient taking differently: Take 10 mg by mouth daily as needed for allergies. ) 30 tablet 3  . cholecalciferol (VITAMIN D) 1000 UNITS tablet Take 1,000 Units by mouth daily.    . clopidogrel (PLAVIX) 75 MG tablet Take 1 tablet (75 mg total) by mouth daily. 90 tablet 3  . diazepam (VALIUM) 2 MG tablet Take 1 tablet (2 mg total) by mouth every 8 (eight) hours as needed. For muscle spasm 30 tablet 0  . docusate sodium (COLACE) 100 MG capsule Take 1 capsule (100 mg total) by mouth 2 (two) times daily as needed for mild constipation. 20 capsule 1  . famotidine (PEPCID) 20 MG tablet TAKE 1 TABLET BY MOUTH 2 TIMES DAILY. 180 tablet 1  . fluticasone (FLONASE) 50 MCG/ACT nasal spray Place 2 sprays into both nostrils daily. Take two sprays in each nostril daily. (Patient taking differently: Place 2 sprays into both nostrils daily as needed for allergies. Take two sprays in each nostril daily.) 16 g 12  . metFORMIN (GLUCOPHAGE-XR) 500 MG 24 hr tablet TAKE 1 TABLET BY MOUTH DAILY WITH BREAKFAST. 180 tablet 2  . methocarbamol (ROBAXIN) 500 MG tablet Take 1 tablet (500 mg total) by mouth 3 (three) times daily between meals as needed for muscle spasms. 40 tablet 1  . metoprolol succinate (TOPROL-XL) 25 MG 24 hr tablet TAKE 1/2 TABLET BY MOUTH DAILY 15 tablet 5  . simvastatin (ZOCOR) 40 MG tablet TAKE 1 TABLET BY MOUTH EVERY EVENING. 90 tablet 2  . telmisartan (MICARDIS) 40 MG tablet Take 1 tablet (40 mg total) by mouth every morning. 90 tablet 1   No current facility-administered medications for this  visit.     Allergies:    Allergies  Allergen Reactions  . Niacin And Related Other (See Comments)    Whelps and skin flushed, mouth tingling  . Tolectin [Tolmetin] Rash    Social History:  The patient  reports that she has never smoked. She has never used smokeless tobacco. She reports that she drinks alcohol. She reports that she does not use drugs.   Family history:   Family History  Problem Relation Age of Onset  . Coronary artery disease Mother   . Rheum arthritis Mother   . Dementia Mother   . Heart attack Father   . Hypertension Father   . Hyperlipidemia Father   .  Other Father     MVA  . Hypertension Brother   . Cancer Brother   . Heart disease Brother   . Cancer Paternal Grandmother     stomach  . Diabetes Paternal Grandfather     ROS:  Please see the history of present illness.  All other systems reviewed and negative.   PHYSICAL EXAM: VS:  BP 130/64   Pulse 60   Ht 5' 1.5" (1.562 m)   Wt 215 lb (97.5 kg)   BMI 39.97 kg/m  Well nourished, well developed, in no acute distress HEENT: Pupils are equal round react to light accommodation extraocular movements are intact.  Cardiac: Regular rate and rhythm without murmurs rubs or gallops. Lungs:  clear to auscultation bilaterally, no wheezing, rhonchi or rales Ext: 1+ lower extremity edema.  2+ radial and dorsalis pedis pulses. Skin: warm and dry Neuro:  Grossly normal Psych: Mood, affect normal  EKG: Deferred   ASSESSMENT: 1. Coronary artery disease status post DES the proximal LAD in 2010 2. Hypertension 3. Dyslipidemia 4. Obesity 5. Type 2 diabetes 6. Palpitations 7. OSA on CPAP  PLAN:  1.  Mrs. Critcher reports that she's been having more frequent palpitations. I like to rule out atrial fibrillation as well as a history of this in the family and she has risk factors for that. She seemed to have pressure in the chest associated with this but not with activity or other exertion. These episodes happen  several times a week. She is on a beta blocker. Plan for 2 week interval to her monitor and follow-up with me afterwards  Chrystie NoseKenneth C. Hilty, MD, [facility] Attending Cardiologist [facility]

## 2016-02-13 DIAGNOSIS — R002 Palpitations: Secondary | ICD-10-CM | POA: Diagnosis not present

## 2016-02-14 MED FILL — FAMOTIDINE 20 MG TABLET: 20 | 90 days supply | Qty: 180 | Fill #0

## 2016-02-14 MED FILL — CLOPIDOGREL 75 MG TABLET: 75 | 90 days supply | Qty: 90 | Fill #0

## 2016-02-26 ENCOUNTER — Other Ambulatory Visit: Payer: PPO

## 2016-02-26 ENCOUNTER — Other Ambulatory Visit: Payer: Self-pay | Admitting: Family Medicine

## 2016-02-26 DIAGNOSIS — M62838 Other muscle spasm: Secondary | ICD-10-CM

## 2016-02-26 MED FILL — METOPROLOL SUCC ER 25 MG TA: 25 | 30 days supply | Qty: 15 | Fill #1

## 2016-03-07 ENCOUNTER — Encounter: Payer: Self-pay | Admitting: Internal Medicine

## 2016-03-07 ENCOUNTER — Ambulatory Visit (INDEPENDENT_AMBULATORY_CARE_PROVIDER_SITE_OTHER): Payer: PPO | Admitting: Internal Medicine

## 2016-03-07 VITALS — BP 139/59 | HR 64 | Ht 61.5 in | Wt 214.6 lb

## 2016-03-07 DIAGNOSIS — I2510 Atherosclerotic heart disease of native coronary artery without angina pectoris: Secondary | ICD-10-CM | POA: Diagnosis not present

## 2016-03-07 DIAGNOSIS — R002 Palpitations: Secondary | ICD-10-CM | POA: Diagnosis not present

## 2016-03-07 DIAGNOSIS — I10 Essential (primary) hypertension: Secondary | ICD-10-CM

## 2016-03-07 DIAGNOSIS — I2583 Coronary atherosclerosis due to lipid rich plaque: Secondary | ICD-10-CM

## 2016-03-07 MED FILL — METFORMIN HCL ER 500 MG TAB: 500 | 90 days supply | Qty: 90 | Fill #0

## 2016-03-07 MED FILL — diazePAM 2 MG TABS: 2 | 10 days supply | Qty: 30 | Fill #0

## 2016-03-07 NOTE — Patient Instructions (Signed)
Your physician wants you to follow-up in: ONE YEAR with Dr. Hilty. You will receive a reminder letter in the mail two months in advance. If you don't receive a letter, please call our office to schedule the follow-up appointment.  

## 2016-03-07 NOTE — Progress Notes (Signed)
Date:  03/07/2016   ID:  [name], DOB 11/05/1950, MRN [id number]  PCP:  Abbe AmsterdamOPLAND,JESSICA, MD  Primary Cardiologist:  Wael Maestas  CC: Palpitations   History of Present Illness: [name] is a 65 y.o. female with a history of CAD with DES stent to prox. LAD in 2010 with residual disease in LCX 10-20% and RCA 20% lesion.  She also has HTN, DM, dyslipidemia and obesity.  She presented on 10/01/12 to the ED with c/o chest pressure. Discomfort began at work and associated with flushing symptoms secondary to niacin. No DOE, SOB, nausea or diaphoresis. She then developed chest pressure and nausea and went to Urgent care. She thought the flushing was secondary to stopping ASA. She was given ASA, cooled down and then had chest presssure. [facility] and NTG SL did resolve the pressure. She was very concerned about the pressure because it was the same that she had with her Lexiscan in 2010 that lead to her stent.  Coronary angiogram revealed widely patent proximal LAD stent with otherwise normal coronary arteries and normal LV function.  [name] recently underwent back surgery and is doing fairly well. She still is having some back pain but is adamant about not taking medication. She plans to go back to work on Monday next week. Unfortunately she is still grieving from her brother who passed away last week. He had stage IV pancreatic cancer and lived less than one year.  [name] returns today for follow-up. Overall she denies any chest pain or worsening shortness of breath. Recently she's been having some palpitations, however she attributes that to stress. She recently is been placed on CPAP and seems to be tolerating that well. She's had some problems with memory loss but neurologic workup is been unremarkable. She recently had worsening back problems and has been to see Dr. Shelle IronBeane. He is considering surgery on her back but would need her to come off of her antiplatelets medications.  At the  pleasure seeing [name] back today in follow-up. She underwent successful back surgery and is doing better. She's managed to lose significant amount of weight. She's remained on Toprol which was started preoperatively. Her surgery was without complication. Recently she's had some dizziness with change in position. This could be related to dietary changes however I did note that her diastolic pressure is low today at 56. She denies any chest pain or worsening shortness of breath.  02/12/2016  [name] returns today for follow-up. She is recently been having some more palpitations. She reports being under significant stress as her daughter and granddaughter are now living with them. She gets some fullness in her chest when she has palpitations. These episodes occur 2-3 times a week over the past month or so. She denies any exertional chest pain or worsening shortness of breath. Weight is Morrie Sheldonshley down 3 pounds since her last office visit. Blood pressure is well-controlled. She recently started Celebrex and I spoke with her primary care provider about this. She needs to be careful about taking it regularly in the setting of aspirin and Plavix use.  03/07/2016  I saw [name] back today for follow-up of her monitor. She reports she had 2 episodes of palpitations and some elevated blood pressure on the day of Thanksgiving. She was dealing with her sister who had recently had a brain bleed and had to take her to the emergency room. She apparently had some seizures. She was also trying to  cook Thanksgiving meals and felt overwhelmed. She did take some Valium with some benefit however one point said her blood pressure was up to 210/100. Since then she's felt much better. We did not to capture any episodes of arrhythmias, extrasystoles or anything concerning on her monitor. I suspect her symptoms are related to stress and anxiety. Blood pressure is fairly well-controlled today.  Wt Readings from Last 3 Encounters:    03/07/16 214 lb 9.6 oz (97.3 kg)  02/12/16 215 lb (97.5 kg)  01/25/16 214 lb (97.1 kg)     Past Medical History:  Diagnosis Date  . Anemia   . Anginal pain (HCC)    pt relates to stress pt was seen by Dr Rennis Golden 3 wks ago was given metoprolol   . Bronchitis    hx of   . CAD (coronary artery disease)    PCI to LAD in 2010, mild residual disease in LCX and RCA  . Cataracts, bilateral   . Diabetes mellitus without complication (HCC)   . Family history of adverse reaction to anesthesia    pts mother had difficulty awakening   . GERD (gastroesophageal reflux disease)   . Hyperlipidemia LDL goal < 70   . Hypertension   . Lumbar back pain   . Numbness    left leg and foot   . Plantar fascia rupture    Left Foot  . Sleep apnea    uses CPAP     Current Outpatient Prescriptions  Medication Sig Dispense Refill  . aspirin EC 81 MG tablet Take 1 tablet (81 mg total) by mouth daily. Resume 5 days post-op    . celecoxib (CELEBREX) 100 MG capsule Take 1 capsule (100 mg total) by mouth 2 (two) times daily. Use as needed for foot and ankle pain 60 capsule 3  . cetirizine (ZYRTEC) 10 MG tablet Take 1 tablet (10 mg total) by mouth daily. (Patient taking differently: Take 10 mg by mouth daily as needed for allergies. ) 30 tablet 3  . cholecalciferol (VITAMIN D) 1000 UNITS tablet Take 1,000 Units by mouth daily.    . clopidogrel (PLAVIX) 75 MG tablet Take 1 tablet (75 mg total) by mouth daily. 90 tablet 3  . diazepam (VALIUM) 2 MG tablet TAKE 1 TABLET BY MOUTH EVERY 8 HOURS AS NEEDED FOR MUSCLE SPASMS 30 tablet 0  . docusate sodium (COLACE) 100 MG capsule Take 1 capsule (100 mg total) by mouth 2 (two) times daily as needed for mild constipation. 20 capsule 1  . famotidine (PEPCID) 20 MG tablet TAKE 1 TABLET BY MOUTH 2 TIMES DAILY. 180 tablet 1  . fluticasone (FLONASE) 50 MCG/ACT nasal spray Place 2 sprays into both nostrils daily. Take two sprays in each nostril daily. (Patient taking differently:  Place 2 sprays into both nostrils daily as needed for allergies. Take two sprays in each nostril daily.) 16 g 12  . metFORMIN (GLUCOPHAGE-XR) 500 MG 24 hr tablet TAKE 1 TABLET BY MOUTH DAILY WITH BREAKFAST. 180 tablet 2  . methocarbamol (ROBAXIN) 500 MG tablet Take 1 tablet (500 mg total) by mouth 3 (three) times daily between meals as needed for muscle spasms. 40 tablet 1  . metoprolol succinate (TOPROL-XL) 25 MG 24 hr tablet TAKE 1/2 TABLET BY MOUTH DAILY 15 tablet 5  . simvastatin (ZOCOR) 40 MG tablet TAKE 1 TABLET BY MOUTH EVERY EVENING. 90 tablet 2  . telmisartan (MICARDIS) 40 MG tablet Take 1 tablet (40 mg total) by mouth every morning. 90 tablet 1  No current facility-administered medications for this visit.     Allergies:    Allergies  Allergen Reactions  . Niacin And Related Other (See Comments)    Whelps and skin flushed, mouth tingling  . Tolectin [Tolmetin] Rash    Social History:  The patient  reports that she has never smoked. She has never used smokeless tobacco. She reports that she drinks alcohol. She reports that she does not use drugs.   Family history:   Family History  Problem Relation Age of Onset  . Coronary artery disease Mother   . Rheum arthritis Mother   . Dementia Mother   . Heart attack Father   . Hypertension Father   . Hyperlipidemia Father   . Other Father     MVA  . Hypertension Brother   . Cancer Brother   . Heart disease Brother   . Cancer Paternal Grandmother     stomach  . Diabetes Paternal Grandfather     ROS:  Please see the history of present illness.  All other systems reviewed and negative.   PHYSICAL EXAM: VS:  BP (!) 139/59 (BP Location: Left Arm, Patient Position: Sitting, Cuff Size: Normal)   Pulse 64   Ht 5' 1.5" (1.562 m)   Wt 214 lb 9.6 oz (97.3 kg)   BMI 39.89 kg/m  Deferred  EKG: Deferred  ASSESSMENT: 1. Coronary artery disease status post DES the proximal LAD in  2010 2. Hypertension 3. Dyslipidemia 4. Obesity 5. Type 2 diabetes 6. Palpitations 7. OSA on CPAP 8. Anxiety  PLAN:  1.  [name] has been having episodes of palpitations which she had while wearing a monitor but it failed to show any arrhythmias or extrasystoles. She did have a spike in blood pressure which is related to a lot of stress and she reported improvement after taking some Valium. Blood pressure appears to be much better at baseline. I tried to reassure her that her monitor showed no significant changes. We'll plan to continue to monitor blood pressure and adjust medication accordingly if that is the driving factor however I think it's reactive to situational stresses.  Follow-up annually or sooner as necessary.  Chrystie NoseKenneth C. Kateena Degroote, MD, [facility] Attending Cardiologist [facility]

## 2016-03-15 ENCOUNTER — Ambulatory Visit: Payer: PPO | Admitting: Internal Medicine

## 2016-03-26 MED FILL — SIMVASTATIN 40 MG TABLET: 40 | 90 days supply | Qty: 90 | Fill #0

## 2016-04-01 ENCOUNTER — Other Ambulatory Visit: Payer: PPO

## 2016-04-01 DIAGNOSIS — M25572 Pain in left ankle and joints of left foot: Secondary | ICD-10-CM | POA: Diagnosis not present

## 2016-04-01 DIAGNOSIS — M2142 Flat foot [pes planus] (acquired), left foot: Secondary | ICD-10-CM | POA: Diagnosis not present

## 2016-04-02 ENCOUNTER — Telehealth: Payer: Self-pay | Admitting: Family Medicine

## 2016-04-02 NOTE — Telephone Encounter (Signed)
Patient has been on medicare for 1 1/2year scheduled AWV with Eber Jonesarolyn for 06/06/16 at 9:30am and physical appointment with PCP at 10:30am.

## 2016-04-08 MED FILL — METOPROLOL SUCC ER 25 MG TA: 25 | 30 days supply | Qty: 15 | Fill #2

## 2016-04-08 MED FILL — TELMISARTAN 40 MG TABLET: 40 | 90 days supply | Qty: 90 | Fill #0

## 2016-05-06 MED FILL — METOPROLOL SUCC ER 25 MG TA: 25 | 30 days supply | Qty: 15 | Fill #3

## 2016-05-07 ENCOUNTER — Other Ambulatory Visit: Payer: Self-pay | Admitting: Family Medicine

## 2016-05-07 DIAGNOSIS — Z1231 Encounter for screening mammogram for malignant neoplasm of breast: Secondary | ICD-10-CM

## 2016-05-13 ENCOUNTER — Encounter: Payer: Self-pay | Admitting: Podiatry

## 2016-05-13 ENCOUNTER — Ambulatory Visit (INDEPENDENT_AMBULATORY_CARE_PROVIDER_SITE_OTHER): Payer: PPO | Admitting: Podiatry

## 2016-05-13 DIAGNOSIS — G8929 Other chronic pain: Secondary | ICD-10-CM

## 2016-05-13 DIAGNOSIS — M76822 Posterior tibial tendinitis, left leg: Secondary | ICD-10-CM

## 2016-05-13 DIAGNOSIS — M79672 Pain in left foot: Secondary | ICD-10-CM

## 2016-05-13 DIAGNOSIS — M2142 Flat foot [pes planus] (acquired), left foot: Secondary | ICD-10-CM

## 2016-05-13 DIAGNOSIS — M25572 Pain in left ankle and joints of left foot: Secondary | ICD-10-CM | POA: Diagnosis not present

## 2016-05-13 NOTE — Progress Notes (Signed)
Subjective:     Patient ID: [name], female   DOB: 1950-04-26, 66 y.o.   MRN: [id number]  HPI patient presents stating that she loves the brace but she's been getting irritation on top of her foot   Review of Systems     Objective:   Physical Exam Neurovascular status intact with patient found to have irritation dorsal left foot around the first metatarsal base cuneiform with possible bony enlargement around this area    Assessment:     Irritation which may be secondary to pressure against the bone structure    Plan:     Padding applied and instructed on wider-type shoe gear

## 2016-05-14 ENCOUNTER — Ambulatory Visit: Payer: PPO | Admitting: *Deleted

## 2016-05-14 DIAGNOSIS — M79672 Pain in left foot: Secondary | ICD-10-CM

## 2016-05-16 NOTE — Progress Notes (Signed)
Patient ID: [name] PeonHelen J Simpson, female   DOB: 1950-07-06, 66 y.o.   MRN: [id number]  Ria ClockRick Puckett CPed evaluated AFO for comfort added additional padding and recommended appropriate shoe gear.  Patient to follow on as needed basis

## 2016-05-22 MED FILL — CLOPIDOGREL 75 MG TABLET: 75 | 90 days supply | Qty: 90 | Fill #1

## 2016-06-03 ENCOUNTER — Other Ambulatory Visit: Payer: Self-pay | Admitting: Family Medicine

## 2016-06-03 ENCOUNTER — Other Ambulatory Visit: Payer: Self-pay | Admitting: Internal Medicine

## 2016-06-03 DIAGNOSIS — M62838 Other muscle spasm: Secondary | ICD-10-CM

## 2016-06-03 MED FILL — METFORMIN HCL ER 500 MG TAB: 500 | 90 days supply | Qty: 90 | Fill #1

## 2016-06-03 MED FILL — METOPROLOL SUCC ER 25 MG TA: 25 | 30 days supply | Qty: 15 | Fill #0

## 2016-06-04 ENCOUNTER — Other Ambulatory Visit: Payer: Self-pay | Admitting: Emergency Medicine

## 2016-06-04 DIAGNOSIS — M62838 Other muscle spasm: Secondary | ICD-10-CM

## 2016-06-04 MED ORDER — DIAZEPAM 2 MG PO TABS: ORAL_TABLET | ORAL | 0 refills | Status: DC

## 2016-06-04 MED FILL — diazePAM 2 MG TABS: 2 | 10 days supply | Qty: 30 | Fill #0

## 2016-06-04 NOTE — Telephone Encounter (Signed)
NCCSR:  Only entry is diazepam 30 pills on 03/07/2016 Will refill for her

## 2016-06-04 NOTE — Telephone Encounter (Signed)
Received refill request for diazepam (VALIUM) 2 MG tablet. Last office visit 01/24/16 and last refill 02/26/16. Is it ok to refill? Please advise.

## 2016-06-05 ENCOUNTER — Ambulatory Visit (INDEPENDENT_AMBULATORY_CARE_PROVIDER_SITE_OTHER): Payer: PPO | Admitting: Family Medicine

## 2016-06-05 VITALS — BP 150/51 | HR 60 | Temp 98.2°F | Ht 64.5 in | Wt 219.6 lb

## 2016-06-05 DIAGNOSIS — M545 Low back pain, unspecified: Secondary | ICD-10-CM

## 2016-06-05 DIAGNOSIS — M79605 Pain in left leg: Secondary | ICD-10-CM

## 2016-06-05 MED ORDER — HYDROCODONE-ACETAMINOPHEN 5-325 MG PO TABS: 1.0000 | ORAL_TABLET | Freq: Four times a day (QID) | ORAL | 0 refills | Status: DC | PRN

## 2016-06-05 MED ORDER — PREDNISONE 20 MG PO TABS: ORAL_TABLET | ORAL | 0 refills | Status: DC

## 2016-06-05 MED FILL — HYDROCODON-APAP 5-325: 5-325 | 5 days supply | Qty: 20 | Fill #0

## 2016-06-05 MED FILL — predniSONE 20 MG TABS: 20 | 8 days supply | Qty: 12 | Fill #0

## 2016-06-05 NOTE — Progress Notes (Signed)
[facility] [address], [address] [place], [place] [id number] [phone]  Date:  06/05/2016   Name:  [name] PeonHelen J Simpson   DOB:  April 18, 1950   MRN:  [id number]  PCP:  Abbe AmsterdamOPLAND,Demetrious Rainford, MD    Chief Complaint: Back Pain   History of Present Illness:  [name] PeonHelen J Simpson is a 66 y.o. very pleasant female patient who presents with the following: Here today with acute back pain They got some new floors at home recently- she slipped while walking on the new floors, she did not fall but she jerked her back hard trying to stay upright. This occurred on Saturday; today is Wednesday.  She had immediate onset of acute back pain which has settled into her left buttock and runs down her left leg   She took some flexeril yesterday and it did help some She has an rx for valium as well which she took today as a muscle relaxer- it is helping   She has a history of spine problems and is a pt of Dr. Shelle IronBeane- her last lumbar spine operation was in 2016, also had lumbar operation in 2014 She notes that her left leg feels numb, the left 2nd toe feels "like it's not there."  She does have neuropathy in this foot at baseline however.   She admits that she has been "peeing on myself" for the last 3-4 months. It sounds like she has urge incontinence. However she has not really noted stress incontinence This has not changed since her back injury and she does not have any problems with bowel control  No saddle anesthesia.   She is using tylenol for pain- this is helping her some but not complexly  She does not generally check her glucose at home- her DM has been under good control with metformin She has taken prednisone in the past and tolerated it ok   Lab Results  Component Value Date   HGBA1C 6.7 (H) 01/25/2016   BP Readings from Last 3 Encounters:  06/05/16 (!) 147/48  03/07/16 (!) 139/59  02/12/16 130/64     Patient Active Problem List   Diagnosis Date  Noted  . Palpitations 02/12/2016  . Osteopenia 11/10/2015  . HNP (herniated nucleus pulposus), lumbar 10/12/2014  . Spinal stenosis at L4-L5 level 10/12/2014  . Obesity, Class III, BMI 40-49.9 (morbid obesity) (HCC) 07/29/2014  . OSA on CPAP 07/29/2014  . Iron deficiency anemia 07/29/2014  . DM (diabetes mellitus) with complications (HCC) 07/29/2014  . Environmental and seasonal allergies 04/28/2014  . OSA (obstructive sleep apnea) 02/22/2014  . Spinal stenosis, lumbar region, with neurogenic claudication 01/06/2013  . Obesity, morbid, BMI 40.0-49.9 (HCC) 10/20/2012  . Chest pain 10/02/2012  . Coronary artery disease due to lipid rich plaque   . Hyperlipidemia with target LDL less than 70   . Diabetes mellitus, type 2 (HCC) 04/16/2012  . Essential hypertension 04/16/2012    Past Medical History:  Diagnosis Date  . Anemia   . Anginal pain (HCC)    pt relates to stress pt was seen by Dr Rennis GoldenHilty 3 wks ago was given metoprolol   . Bronchitis    hx of   . CAD (coronary artery disease)    PCI to LAD in 2010, mild residual disease in LCX and RCA  . Cataracts, bilateral   . Diabetes mellitus without complication (HCC)   . Family history of adverse reaction to anesthesia    pts mother  had difficulty awakening   . GERD (gastroesophageal reflux disease)   . Hyperlipidemia LDL goal < 70   . Hypertension   . Lumbar back pain   . Numbness    left leg and foot   . Plantar fascia rupture    Left Foot  . Sleep apnea    uses CPAP     Past Surgical History:  Procedure Laterality Date  . ABDOMINAL HYSTERECTOMY    . CAROTID STENT  2009   pt denies   . CORONARY ANGIOPLASTY WITH STENT PLACEMENT  2010and 10-02-2012   Stent DES, Xience to prox. LAD  . DOPPLER ECHOCARDIOGRAPHY  08/01/2009   EF=>55%,LV normal  . LEFT HEART CATHETERIZATION WITH CORONARY ANGIOGRAM N/A 10/02/2012   Procedure: LEFT HEART CATHETERIZATION WITH CORONARY ANGIOGRAM;  Surgeon: Runell Gess, MD;  Location: [facility] CATH  LAB;  Service: Cardiovascular;  Laterality: N/A;  . lower arterial duplex  06/20/10   abi's normal,rgt 0.98,lft 1.06;bilateral PVRs normal  . LUMBAR LAMINECTOMY/DECOMPRESSION MICRODISCECTOMY Left 01/06/2013   Procedure: MICRO LUMBAR DECOMPRESSION L4-5 AND L5-S1;  Surgeon: Javier Docker, MD;  Location: [facility] ORS;  Service: Orthopedics;  Laterality: Left;  . LUMBAR LAMINECTOMY/DECOMPRESSION MICRODISCECTOMY Left 10/12/2014   Procedure: REVISION MICRO LUMBAR/DECOMPRESSION L4-5 LEFT ;  Surgeon: Jene Every, MD;  Location: [facility] ORS;  Service: Orthopedics;  Laterality: Left;  . NM MYOCAR PERF WALL MOTION  09/22/2008   lexiscan-EF 83%; glogal LV systolic fx is norm. ,evidence of mild ischemia basal anterior,midanterior and apical lateral region(s).   . TUBAL LIGATION    . TYMPANOPLASTY Bilateral   . UVULOPALATOPHARYNGOPLASTY     pt denies     Social History  Substance Use Topics  . Smoking status: Never Smoker  . Smokeless tobacco: Never Used  . Alcohol use Yes     Comment: occasional, 1 a month wine    Family History  Problem Relation Age of Onset  . Coronary artery disease Mother   . Rheum arthritis Mother   . Dementia Mother   . Heart attack Father   . Hypertension Father   . Hyperlipidemia Father   . Other Father     MVA  . Hypertension Brother   . Cancer Brother   . Heart disease Brother   . Cancer Paternal Grandmother     stomach  . Diabetes Paternal Grandfather     Allergies  Allergen Reactions  . Niacin And Related Other (See Comments)    Whelps and skin flushed, mouth tingling  . Tolectin [Tolmetin] Rash    Medication list has been reviewed and updated.  Current Outpatient Prescriptions on File Prior to Visit  Medication Sig Dispense Refill  . aspirin EC 81 MG tablet Take 1 tablet (81 mg total) by mouth daily. Resume 5 days post-op    . cetirizine (ZYRTEC) 10 MG tablet Take 1 tablet (10 mg total) by mouth daily. (Patient taking differently: Take 10 mg by mouth  daily as needed for allergies. ) 30 tablet 3  . cholecalciferol (VITAMIN D) 1000 UNITS tablet Take 1,000 Units by mouth daily.    . clopidogrel (PLAVIX) 75 MG tablet Take 1 tablet (75 mg total) by mouth daily. 90 tablet 3  . diazepam (VALIUM) 2 MG tablet TAKE 1 TABLET BY MOUTH EVERY 8 HOURS AS NEEDED FOR MUSCLE SPASMS 30 tablet 0  . docusate sodium (COLACE) 100 MG capsule Take 1 capsule (100 mg total) by mouth 2 (two) times daily as needed for mild constipation. 20 capsule 1  .  famotidine (PEPCID) 20 MG tablet TAKE 1 TABLET BY MOUTH 2 TIMES DAILY. 180 tablet 1  . fluticasone (FLONASE) 50 MCG/ACT nasal spray Place 2 sprays into both nostrils daily. Take two sprays in each nostril daily. (Patient taking differently: Place 2 sprays into both nostrils daily as needed for allergies. Take two sprays in each nostril daily.) 16 g 12  . metFORMIN (GLUCOPHAGE-XR) 500 MG 24 hr tablet TAKE 1 TABLET BY MOUTH DAILY WITH BREAKFAST. 180 tablet 2  . metoprolol succinate (TOPROL-XL) 25 MG 24 hr tablet TAKE 1/2 TABLET BY MOUTH DAILY 15 tablet 4  . simvastatin (ZOCOR) 40 MG tablet TAKE 1 TABLET BY MOUTH EVERY EVENING. 90 tablet 2  . telmisartan (MICARDIS) 40 MG tablet Take 1 tablet (40 mg total) by mouth every morning. 90 tablet 1   No current facility-administered medications on file prior to visit.     Review of Systems:  As per HPI- otherwise negative.   Physical Examination: Vitals:   06/05/16 1433  BP: (!) 147/48  Pulse: 60  Temp: 98.2 F (36.8 C)   Vitals:   06/05/16 1433  Weight: 219 lb 9.6 oz (99.6 kg)  Height: 5' 4.5" (1.638 m)   Body mass index is 37.11 kg/m. Ideal Body Weight: Weight in (lb) to have BMI = 25: 147.6  GEN: WDWN, NAD, Non-toxic, A & O x 3, obese, looks well but is in pain with moveent HEENT: Atraumatic, Normocephalic. Neck supple. No masses, No LAD. Ears and Nose: No external deformity. CV: RRR, No M/G/R. No JVD. No thrill. No extra heart sounds. PULM: CTA B, no wheezes,  crackles, rhonchi. No retractions. No resp. distress. No accessory muscle use. EXTR: No c/c/e NEURO Normal gait.  PSYCH: Normally interactive. Conversant. Not depressed or anxious appearing.  Calm demeanor.  She endorses significant tenderness over the left sciatic notch, and has pain/ restricted movement with lumbar flexion and extension.   Negative SLR bilaterally Bilateral patellar reflexes are reduced but symmetrical.   No saddle anesthesia Reduced left LE strength but this seems due to poor effort secondary to pain   Assessment and Plan: Low back pain radiating to left lower extremity - Plan: predniSONE (DELTASONE) 20 MG tablet, HYDROcodone-acetaminophen (NORCO/VICODIN) 5-325 MG tablet  Here today with left lower back/ buttock pain with radiation into her left leg She does not have signs of symptoms c/w acute CES.   Will start on prednisone and she will carefully monitor her glucose. She will let me know if out of control Add vicodin as needed for pain control.  If used with her valium she will take a reduced dose of each   See patient instructions for more details.    She plans to see me for a CPE in 1-2 weeks    Signed Abbe Amsterdam, MD

## 2016-06-05 NOTE — Progress Notes (Signed)
Pre visit review using our clinic tool,if applicable. No additional management support is needed unless otherwise documented below in the visit note.  

## 2016-06-05 NOTE — Patient Instructions (Signed)
We are going to treat your back pain with prednisone (steroid) and the pain medication as needed Remember that the steroid will raise your blood sugar Please check your glucose 1-2x a day while on the prednisone and let me know if you are running higher than 300 -350 You may use the pain medication as needed; remember it can make you sleepy especially in combination with the muscle relaxer.  If you use both the pain pill and the muscle relaxer I would recommend that you use a reduced dose of each   Please let me know if you are not improving in the next 2 days- Sooner if worse.

## 2016-06-06 ENCOUNTER — Encounter: Payer: PPO | Admitting: Family Medicine

## 2016-06-06 ENCOUNTER — Ambulatory Visit: Payer: PPO | Admitting: *Deleted

## 2016-06-13 ENCOUNTER — Ambulatory Visit
Admission: RE | Admit: 2016-06-13 | Discharge: 2016-06-13 | Disposition: A | Discharge status: Home / Self Care | Payer: PPO | Source: Ambulatory Visit | Attending: Family Medicine | Admitting: Family Medicine

## 2016-06-13 DIAGNOSIS — Z1231 Encounter for screening mammogram for malignant neoplasm of breast: Secondary | ICD-10-CM | POA: Diagnosis not present

## 2016-06-28 ENCOUNTER — Other Ambulatory Visit: Payer: Self-pay | Admitting: Internal Medicine

## 2016-06-28 MED FILL — TELMISARTAN 40 MG TABLET: 40 | 90 days supply | Qty: 90 | Fill #0

## 2016-06-28 MED FILL — SIMVASTATIN 40 MG TABLET: 40 | 90 days supply | Qty: 90 | Fill #1

## 2016-06-28 NOTE — Telephone Encounter (Signed)
REFILL 

## 2016-07-10 ENCOUNTER — Other Ambulatory Visit: Payer: Self-pay | Admitting: Internal Medicine

## 2016-07-10 MED FILL — FAMOTIDINE 20 MG TABLET: 20 | 90 days supply | Qty: 180 | Fill #0

## 2016-07-10 MED FILL — METOPROLOL SUCC ER 25 MG TA: 25 | 30 days supply | Qty: 15 | Fill #1

## 2016-07-10 NOTE — Telephone Encounter (Signed)
Rx(s) sent to pharmacy electronically.  

## 2016-07-11 DIAGNOSIS — M545 Low back pain: Secondary | ICD-10-CM | POA: Diagnosis not present

## 2016-07-11 DIAGNOSIS — Z9889 Other specified postprocedural states: Secondary | ICD-10-CM | POA: Diagnosis not present

## 2016-07-11 MED FILL — DICLOFENAC SODIUM 1% GEL: 1 | 50 days supply | Qty: 300 | Fill #0

## 2016-07-11 MED FILL — predniSONE 5 MG (21) TBPK: 5 | 6 days supply | Qty: 21 | Fill #0

## 2016-07-18 ENCOUNTER — Ambulatory Visit (INDEPENDENT_AMBULATORY_CARE_PROVIDER_SITE_OTHER): Payer: PPO | Admitting: Family Medicine

## 2016-07-18 VITALS — BP 132/80 | HR 65 | Temp 97.8°F | Ht 61.0 in | Wt 219.6 lb

## 2016-07-18 DIAGNOSIS — E785 Hyperlipidemia, unspecified: Secondary | ICD-10-CM | POA: Diagnosis not present

## 2016-07-18 DIAGNOSIS — Z0000 Encounter for general adult medical examination without abnormal findings: Secondary | ICD-10-CM | POA: Diagnosis not present

## 2016-07-18 DIAGNOSIS — E559 Vitamin D deficiency, unspecified: Secondary | ICD-10-CM | POA: Diagnosis not present

## 2016-07-18 DIAGNOSIS — M62838 Other muscle spasm: Secondary | ICD-10-CM | POA: Diagnosis not present

## 2016-07-18 DIAGNOSIS — I10 Essential (primary) hypertension: Secondary | ICD-10-CM

## 2016-07-18 DIAGNOSIS — G8929 Other chronic pain: Secondary | ICD-10-CM

## 2016-07-18 DIAGNOSIS — E1169 Type 2 diabetes mellitus with other specified complication: Secondary | ICD-10-CM

## 2016-07-18 DIAGNOSIS — M79673 Pain in unspecified foot: Secondary | ICD-10-CM

## 2016-07-18 DIAGNOSIS — E119 Type 2 diabetes mellitus without complications: Secondary | ICD-10-CM | POA: Diagnosis not present

## 2016-07-18 DIAGNOSIS — Z130 Encounter for screening for diseases of the blood and blood-forming organs and certain disorders involving the immune mechanism: Secondary | ICD-10-CM

## 2016-07-18 DIAGNOSIS — M21962 Unspecified acquired deformity of left lower leg: Secondary | ICD-10-CM | POA: Diagnosis not present

## 2016-07-18 DIAGNOSIS — D72829 Elevated white blood cell count, unspecified: Secondary | ICD-10-CM

## 2016-07-18 DIAGNOSIS — Z955 Presence of coronary angioplasty implant and graft: Secondary | ICD-10-CM

## 2016-07-18 LAB — LDL CHOLESTEROL, DIRECT: Direct LDL: 102 mg/dL

## 2016-07-18 LAB — COMPREHENSIVE METABOLIC PANEL
ALT: 17 U/L (ref 0–35)
AST: 11 U/L (ref 0–37)
Albumin: 4.2 g/dL (ref 3.5–5.2)
Alkaline Phosphatase: 88 U/L (ref 39–117)
BUN: 21 mg/dL (ref 6–23)
CHLORIDE: 107 meq/L (ref 96–112)
CO2: 26 meq/L (ref 19–32)
CREATININE: 0.83 mg/dL (ref 0.40–1.20)
Calcium: 9.7 mg/dL (ref 8.4–10.5)
GFR: 72.94 mL/min (ref >60.00)
GLUCOSE: 118 mg/dL — AB (ref 70–99)
POTASSIUM: 3.7 meq/L (ref 3.5–5.1)
SODIUM: 141 meq/L (ref 135–145)
Total Bilirubin: 0.4 mg/dL (ref 0.2–1.2)
Total Protein: 6.9 g/dL (ref 6.0–8.3)

## 2016-07-18 LAB — LIPID PANEL
CHOL/HDL RATIO: 5
CHOLESTEROL: 177 mg/dL (ref 0–200)
HDL: 38.2 mg/dL — ABNORMAL LOW (ref >39.00)
NONHDL: 138.57
TRIGLYCERIDES: 235 mg/dL — AB (ref 0.0–149.0)
VLDL: 47 mg/dL — AB (ref 0.0–40.0)

## 2016-07-18 LAB — CBC
HEMATOCRIT: 40.8 % (ref 36.0–46.0)
Hemoglobin: 12.9 g/dL (ref 12.0–15.0)
MCHC: 31.7 g/dL (ref 30.0–36.0)
MCV: 80.2 fl (ref 78.0–100.0)
Platelets: 313 10*3/uL (ref 150.0–400.0)
RBC: 5.09 Mil/uL (ref 3.87–5.11)
RDW: 14.9 % (ref 11.5–15.5)
WBC: 12.5 10*3/uL — ABNORMAL HIGH (ref 4.0–10.5)

## 2016-07-18 LAB — VITAMIN D 25 HYDROXY (VIT D DEFICIENCY, FRACTURES): VITD: 39.17 ng/mL (ref 30.00–100.00)

## 2016-07-18 LAB — HEMOGLOBIN A1C: HEMOGLOBIN A1C: 7 % — AB (ref 4.6–6.5)

## 2016-07-18 MED ORDER — SIMVASTATIN 40 MG PO TABS: 40.0000 mg | ORAL_TABLET | Freq: Every evening | ORAL | 3 refills | Status: DC

## 2016-07-18 MED ORDER — METFORMIN HCL ER 500 MG PO TB24: 500.0000 mg | ORAL_TABLET | Freq: Every day | ORAL | 3 refills | Status: DC

## 2016-07-18 MED ORDER — DIAZEPAM 2 MG PO TABS: ORAL_TABLET | ORAL | 2 refills | Status: DC

## 2016-07-18 MED FILL — diazePAM 2 MG TABS: 2 | 10 days supply | Qty: 30 | Fill #0

## 2016-07-18 NOTE — Progress Notes (Addendum)
[facility] [address], [address] [place], [place] [address] [phone]  Date:  07/18/2016   Name:  [name]   DOB:  1951-02-24   MRN:  [id number]  PCP:  Abbe Amsterdam, MD    Chief Complaint: Annual Exam (Pt here for CPE and is fasting for labs. )   History of Present Illness:  [name] is a 66 y.o. very pleasant female patient who presents with the following:  History of obesity, DM, CAD/ PCI, HTN, hyperlipidemia, back pain and spinal stenosis.  Here today for a CPE s/p hysterectomy- she also sees OBG, Dr. Seymour Bars. mammo was last month, normal Needs foot exam today Can have her pneumovax booster today- last given in 2012 and her prevnar was given over a year ago.  However defer today as she is on prednisone Lab Results  Component Value Date   HGBA1C 6.7 (H) 01/25/2016   She continues to have issues with her back, pain and spasm- she saw Dr. Shelle Iron last week and he put her on steroids. She is taking these currently and is having an MRI on Monday She uses valium for her back spasms- needs a refill of this today The car door slammed on her right leg about 3 weeks ago- she had a large bruise on her medial right leg at the knee.  It seems to be getting better but is still tender and there is a firm spot where at first she had a large bruise.  She suspects a hematoma  She is fasting today for labs   She keeps her 17 yo grand-daughter a fair amount.  She has a total of 9 grandchildren (and step grands- counting the 5 her son will have when he gets married soon)   Wt Readings from Last 3 Encounters:  07/18/16 219 lb 9.6 oz (99.6 kg)  06/05/16 219 lb 9.6 oz (99.6 kg)  03/07/16 214 lb 9.6 oz (97.3 kg)   BP Readings from Last 3 Encounters:  07/18/16 132/80  06/05/16 (!) 150/51  03/07/16 (!) 139/59     Patient Active Problem List   Diagnosis Date Noted  . Osteopenia 11/10/2015  . HNP (herniated nucleus pulposus),  lumbar 10/12/2014  . Spinal stenosis at L4-L5 level 10/12/2014  . OSA on CPAP 07/29/2014  . Iron deficiency anemia 07/29/2014  . DM (diabetes mellitus) with complications (HCC) 07/29/2014  . Environmental and seasonal allergies 04/28/2014  . Spinal stenosis, lumbar region, with neurogenic claudication 01/06/2013  . Obesity, morbid, BMI 40.0-49.9 (HCC) 10/20/2012  . Chest pain 10/02/2012  . Coronary artery disease due to lipid rich plaque   . Hyperlipidemia with target LDL less than 70   . Essential hypertension 04/16/2012    Past Medical History:  Diagnosis Date  . Anemia   . Anginal pain (HCC)    pt relates to stress pt was seen by Dr Rennis Golden 3 wks ago was given metoprolol   . Bronchitis    hx of   . CAD (coronary artery disease)    PCI to LAD in 2010, mild residual disease in LCX and RCA  . Cataracts, bilateral   . Diabetes mellitus without complication (HCC)   . Family history of adverse reaction to anesthesia    pts mother had difficulty awakening   . GERD (gastroesophageal reflux disease)   . Hyperlipidemia LDL goal < 70   . Hypertension   . Lumbar back pain   . Numbness  left leg and foot   . Plantar fascia rupture    Left Foot  . Sleep apnea    uses CPAP     Past Surgical History:  Procedure Laterality Date  . ABDOMINAL HYSTERECTOMY    . BREAST CYST ASPIRATION  1995  . CAROTID STENT  2009   pt denies   . CORONARY ANGIOPLASTY WITH STENT PLACEMENT  2010and 10-02-2012   Stent DES, Xience to prox. LAD  . DOPPLER ECHOCARDIOGRAPHY  08/01/2009   EF=>55%,LV normal  . LEFT HEART CATHETERIZATION WITH CORONARY ANGIOGRAM N/A 10/02/2012   Procedure: LEFT HEART CATHETERIZATION WITH CORONARY ANGIOGRAM;  Surgeon: Runell Gess, MD;  Location: [facility] CATH LAB;  Service: Cardiovascular;  Laterality: N/A;  . lower arterial duplex  06/20/10   abi's normal,rgt 0.98,lft 1.06;bilateral PVRs normal  . LUMBAR LAMINECTOMY/DECOMPRESSION MICRODISCECTOMY Left 01/06/2013   Procedure:  MICRO LUMBAR DECOMPRESSION L4-5 AND L5-S1;  Surgeon: Javier Docker, MD;  Location: [facility] ORS;  Service: Orthopedics;  Laterality: Left;  . LUMBAR LAMINECTOMY/DECOMPRESSION MICRODISCECTOMY Left 10/12/2014   Procedure: REVISION MICRO LUMBAR/DECOMPRESSION L4-5 LEFT ;  Surgeon: Jene Every, MD;  Location: [facility] ORS;  Service: Orthopedics;  Laterality: Left;  . NM MYOCAR PERF WALL MOTION  09/22/2008   lexiscan-EF 83%; glogal LV systolic fx is norm. ,evidence of mild ischemia basal anterior,midanterior and apical lateral region(s).   . TUBAL LIGATION    . TYMPANOPLASTY Bilateral   . UVULOPALATOPHARYNGOPLASTY     pt denies     Social History  Substance Use Topics  . Smoking status: Never Smoker  . Smokeless tobacco: Never Used  . Alcohol use Yes     Comment: occasional, 1 a month wine    Family History  Problem Relation Age of Onset  . Coronary artery disease Mother   . Rheum arthritis Mother   . Dementia Mother   . Heart attack Father   . Hypertension Father   . Hyperlipidemia Father   . Other Father     MVA  . Hypertension Brother   . Cancer Brother   . Heart disease Brother   . Cancer Paternal Grandmother     stomach  . Diabetes Paternal Grandfather   . Breast cancer Neg Hx     Allergies  Allergen Reactions  . Niacin And Related Other (See Comments)    Whelps and skin flushed, mouth tingling  . Tolectin [Tolmetin] Rash    Medication list has been reviewed and updated.  Current Outpatient Prescriptions on File Prior to Visit  Medication Sig Dispense Refill  . aspirin EC 81 MG tablet Take 1 tablet (81 mg total) by mouth daily. Resume 5 days post-op    . cetirizine (ZYRTEC) 10 MG tablet Take 1 tablet (10 mg total) by mouth daily. (Patient taking differently: Take 10 mg by mouth daily as needed for allergies. ) 30 tablet 3  . cholecalciferol (VITAMIN D) 1000 UNITS tablet Take 1,000 Units by mouth daily.    . clopidogrel (PLAVIX) 75 MG tablet Take 1 tablet (75 mg total) by  mouth daily. 90 tablet 3  . diazepam (VALIUM) 2 MG tablet TAKE 1 TABLET BY MOUTH EVERY 8 HOURS AS NEEDED FOR MUSCLE SPASMS 30 tablet 0  . docusate sodium (COLACE) 100 MG capsule Take 1 capsule (100 mg total) by mouth 2 (two) times daily as needed for mild constipation. 20 capsule 1  . famotidine (PEPCID) 20 MG tablet TAKE 1 TABLET BY MOUTH 2 TIMES DAILY. 180 tablet 2  . fluticasone (FLONASE) 50  MCG/ACT nasal spray Place 2 sprays into both nostrils daily. Take two sprays in each nostril daily. (Patient taking differently: Place 2 sprays into both nostrils daily as needed for allergies. Take two sprays in each nostril daily.) 16 g 12  . HYDROcodone-acetaminophen (NORCO/VICODIN) 5-325 MG tablet Take 1 tablet by mouth every 6 (six) hours as needed. 20 tablet 0  . metFORMIN (GLUCOPHAGE-XR) 500 MG 24 hr tablet TAKE 1 TABLET BY MOUTH DAILY WITH BREAKFAST. 180 tablet 2  . metoprolol succinate (TOPROL-XL) 25 MG 24 hr tablet TAKE 1/2 TABLET BY MOUTH DAILY 15 tablet 4  . predniSONE (DELTASONE) 20 MG tablet Take 2 pills a day for 4 days, then 1 pill a day for 4 days 12 tablet 0  . simvastatin (ZOCOR) 40 MG tablet TAKE 1 TABLET BY MOUTH EVERY EVENING. 90 tablet 2  . telmisartan (MICARDIS) 40 MG tablet TAKE 1 TABLET BY MOUTH EVERY MORNING 90 tablet 3   No current facility-administered medications on file prior to visit.     Review of Systems:  As per HPI- otherwise negative. No CP or SOB She is trying to exercise- uses a stationary bike and also tries to walk.  Between her back and her left foot exercise is a challenge for her   Physical Examination: Vitals:   07/18/16 0823  BP: 132/80  Pulse: 65  Temp: 97.8 F (36.6 C)   Vitals:   07/18/16 0823  Weight: 219 lb 9.6 oz (99.6 kg)  Height:  (1.549 m)   Body mass index is 41.49 kg/m. Ideal Body Weight: Weight in (lb) to have BMI = 25: 132  GEN: WDWN, NAD, Non-toxic, A & O x 3, obese, otherwise looks well HEENT: Atraumatic, Normocephalic.  Neck supple. No masses, No LAD.  Bilateral TM wnl, oropharynx normal.  PEERL,EOMI.   Ears and Nose: No external deformity. CV: RRR, No M/G/R. No JVD. No thrill. No extra heart sounds. PULM: CTA B, no wheezes, crackles, rhonchi. No retractions. No resp. distress. No accessory muscle use. ABD: S, NT, ND. No rebound. No HSM. EXTR: No c/c/e NEURO Normal gait.  PSYCH: Normally interactive. Conversant. Not depressed or anxious appearing.  Calm demeanor.  Left foot: she has poor ROM of the ankle- congenitally fused ankle. Also flat foot due to PF rupture that occurred in 2012 Right leg: she has the remnants of a large bruise at the medial knee, and a firm area which represents a resolving hematoma. No pain with ROM of the joint, no effusion  Assessment and Plan: Physical exam  Muscle spasm - Plan: diazepam (VALIUM) 2 MG tablet  Screening for deficiency anemia - Plan: CBC  Hyperlipidemia associated with type 2 diabetes mellitus (HCC) - Plan: simvastatin (ZOCOR) 40 MG tablet, Lipid panel  Controlled type 2 diabetes mellitus without complication, without long-term current use of insulin (HCC) - Plan: metFORMIN (GLUCOPHAGE-XR) 500 MG 24 hr tablet, Comprehensive metabolic panel, Hemoglobin A1c  Essential hypertension  History of coronary artery stent placement - Plan: simvastatin (ZOCOR) 40 MG tablet  Chronic foot pain, unspecified laterality  Vitamin D deficiency - Plan: Vitamin D, 25-hydroxy  Acquired foot deformity, left  CPE today Labs pending as above Refills provided Continue to encourage exercise and a healthy lifestyle  Signed Abbe Amsterdam, MD  Received labs 4/28- letter to pt.  Will order repeat CBC in one month  Results for orders placed or performed in visit on 07/18/16  CBC  Result Value Ref Range   WBC 12.5 (H) 4.0 -  10.5 K/uL   RBC 5.09 3.87 - 5.11 Mil/uL   Platelets 313.0 150.0 - 400.0 K/uL   Hemoglobin 12.9 12.0 - 15.0 g/dL   HCT 16.1 09.6 - 04.5 %   MCV 80.2  78.0 - 100.0 fl   MCHC 31.7 30.0 - 36.0 g/dL   RDW 40.9 81.1 - 91.4 %  Comprehensive metabolic panel  Result Value Ref Range   Sodium 141 135 - 145 mEq/L   Potassium 3.7 3.5 - 5.1 mEq/L   Chloride 107 96 - 112 mEq/L   CO2 26 19 - 32 mEq/L   Glucose, Bld 118 (H) 70 - 99 mg/dL   BUN 21 6 - 23 mg/dL   Creatinine, Ser 7.82 0.40 - 1.20 mg/dL   Total Bilirubin 0.4 0.2 - 1.2 mg/dL   Alkaline Phosphatase 88 39 - 117 U/L   AST 11 0 - 37 U/L   ALT 17 0 - 35 U/L   Total Protein 6.9 6.0 - 8.3 g/dL   Albumin 4.2 3.5 - 5.2 g/dL   Calcium 9.7 8.4 - 95.6 mg/dL   GFR 21.30 >86.57 mL/min  Lipid panel  Result Value Ref Range   Cholesterol 177 0 - 200 mg/dL   Triglycerides 846.9 (H) 0.0 - 149.0 mg/dL   HDL 62.95 (L) >28.41 mg/dL   VLDL 32.4 (H) 0.0 - 40.1 mg/dL   Total CHOL/HDL Ratio 5    NonHDL 138.57   Hemoglobin A1c  Result Value Ref Range   Hgb A1c MFr Bld 7.0 (H) 4.6 - 6.5 %  Vitamin D, 25-hydroxy  Result Value Ref Range   VITD 39.17 30.00 - 100.00 ng/mL  LDL cholesterol, direct  Result Value Ref Range   Direct LDL 102.0 mg/dL

## 2016-07-20 ENCOUNTER — Encounter: Payer: Self-pay | Admitting: Family Medicine

## 2016-07-20 NOTE — Addendum Note (Signed)
Addended by: Abbe Amsterdam C on: 07/20/2016 05:22 AM   Modules accepted: Orders

## 2016-07-22 DIAGNOSIS — M545 Low back pain: Secondary | ICD-10-CM | POA: Diagnosis not present

## 2016-07-25 ENCOUNTER — Telehealth: Payer: Self-pay | Admitting: Family Medicine

## 2016-07-25 DIAGNOSIS — S8001XA Contusion of right knee, initial encounter: Secondary | ICD-10-CM

## 2016-07-25 DIAGNOSIS — M5136 Other intervertebral disc degeneration, lumbar region: Secondary | ICD-10-CM | POA: Diagnosis not present

## 2016-07-25 DIAGNOSIS — Z9889 Other specified postprocedural states: Secondary | ICD-10-CM | POA: Diagnosis not present

## 2016-07-25 NOTE — Telephone Encounter (Signed)
She got hit in the right knee with a car door a few weeks ago and is having some pain- would like to have x-rays which is fine. Will order for her to have done, and printed out her labs for her to pick up. Lab letter did not arrive yet

## 2016-07-25 NOTE — Telephone Encounter (Signed)
Pt called in to request results for her A1C and Hemoglobin. Pt says that she is having a procedure done and need those results before she can have it done.    ALSO, pt says that she showed PCP a area on her leg at a previous visit. Pt says that the area has now started having shooting pain in it. She would like to know if provider could place orders for images for her?   Please advise.   CB: [phone]

## 2016-07-26 ENCOUNTER — Ambulatory Visit (HOSPITAL_BASED_OUTPATIENT_CLINIC_OR_DEPARTMENT_OTHER)
Admission: RE | Admit: 2016-07-26 | Discharge: 2016-07-26 | Disposition: A | Discharge status: Home / Self Care | Payer: PPO | Source: Ambulatory Visit | Attending: Family Medicine | Admitting: Family Medicine

## 2016-07-26 ENCOUNTER — Telehealth: Payer: Self-pay | Admitting: Internal Medicine

## 2016-07-26 DIAGNOSIS — M79661 Pain in right lower leg: Secondary | ICD-10-CM | POA: Diagnosis not present

## 2016-07-26 DIAGNOSIS — M25561 Pain in right knee: Secondary | ICD-10-CM | POA: Diagnosis not present

## 2016-07-26 DIAGNOSIS — W208XXA Other cause of strike by thrown, projected or falling object, initial encounter: Secondary | ICD-10-CM | POA: Diagnosis not present

## 2016-07-26 DIAGNOSIS — S8001XA Contusion of right knee, initial encounter: Secondary | ICD-10-CM | POA: Insufficient documentation

## 2016-07-26 DIAGNOSIS — S8991XA Unspecified injury of right lower leg, initial encounter: Secondary | ICD-10-CM | POA: Diagnosis not present

## 2016-07-26 NOTE — Telephone Encounter (Signed)
Patient dropped of clearance form for EPIDURAL STEROID INJECTION - date: TBD - Dr. Pat PatrickJ. Beane  Per Dr. Rennis GoldenHilty, PATIENT CAN HOLD PLAVIX 5 DAYS PRIOR TO INJECTION  This clearance form was faxed to [phone]

## 2016-07-27 NOTE — Telephone Encounter (Signed)
Received her films yesterday and gave her a call  Dg Tibia/fibula Right  Result Date: 07/26/2016 CLINICAL DATA:  66 year old female status post blunt trauma when car door hit leg 1 month ago. Continued medial and lateral pain. EXAM: RIGHT TIBIA AND FIBULA - 2 VIEW COMPARISON:  Right knee series today reported separately. FINDINGS: Bone mineralization is within normal limits. Visible tibia and fibula are intact. Mild degenerative spurring about the right ankle. Visible talus and calcaneus appear intact. No acute osseous abnormality identified. IMPRESSION: No acute osseous abnormality identified. Electronically Signed   By: Odessa FlemingH  Hall M.D.   On: 07/26/2016 09:06   Dg Knee Complete 4 Views Right  Result Date: 07/26/2016 CLINICAL DATA:  66 year old female status post blunt trauma when car door hit leg 1 month ago. Continued medial and lateral pain. EXAM: RIGHT KNEE - COMPLETE 4+ VIEW COMPARISON:  Right knee series [id number] FINDINGS: Mild tricompartmental degenerative spurring has not significantly changed since 2013. Joint spaces are stable and within normal limits for age. No joint effusion identified. Patella intact. No acute osseous abnormality identified. IMPRESSION: No acute osseous abnormality identified. Mild for age tricompartmental degenerative changes without progression since 2013. Electronically Signed   By: Odessa FlemingH  Hall M.D.   On: 07/26/2016 09:04   No sign of any fracture.  Offered to refer her to ortho- she declines for now but will let me know if her leg is not feeling better soon

## 2016-07-31 ENCOUNTER — Ambulatory Visit (INDEPENDENT_AMBULATORY_CARE_PROVIDER_SITE_OTHER): Payer: PPO | Admitting: Podiatry

## 2016-07-31 DIAGNOSIS — M779 Enthesopathy, unspecified: Secondary | ICD-10-CM | POA: Diagnosis not present

## 2016-07-31 DIAGNOSIS — M76822 Posterior tibial tendinitis, left leg: Secondary | ICD-10-CM | POA: Diagnosis not present

## 2016-07-31 MED ORDER — TRIAMCINOLONE ACETONIDE 10 MG/ML IJ SUSP
10.0000 mg | Freq: Once | INTRAMUSCULAR | Status: AC
  Administered 2016-07-31: 10 mg

## 2016-07-31 NOTE — Progress Notes (Signed)
Subjective:    Patient ID: Brynda PeonHelen J Guedes, female   DOB: 66 y.o.   MRN: [id number]   HPI patient presents stating she still getting a lot of pain in her left ankle joint and is having trouble wearing the brace    ROS      Objective:  Physical Exam Neurovascular status intact with patient found have severe flatfoot deformity left with chronic posterior tibial tendinitis and sinus tarsitis    Assessment:    Reviewed condition and at this point I have recommended injection therapy as it helped quite a bit and I reviewed what would be required. Patient wants injections understanding risk and I injected posterior tibial tendon at its insertion 3 mg Kenalog 5 mg Xylocaine and the sinus tarsi left 3 mg Kenalog 5 mill grams Xylocaine and discussed seen Rick in order to try to do it better job of utilizing the brace which I think will be beneficial. Ultimately could require surgery  X-ray report indicated significant flattening the arch left over right with inflammation of the posterior tib and discomfort in the right ankle of a mild to moderate nature with no indication of arthritis or diastases injury     Plan:

## 2016-08-07 MED FILL — METOPROLOL SUCC ER 25 MG TA: 25 | 30 days supply | Qty: 15 | Fill #2

## 2016-08-12 ENCOUNTER — Other Ambulatory Visit (INDEPENDENT_AMBULATORY_CARE_PROVIDER_SITE_OTHER): Payer: PPO

## 2016-08-12 DIAGNOSIS — D72829 Elevated white blood cell count, unspecified: Secondary | ICD-10-CM | POA: Diagnosis not present

## 2016-08-13 LAB — CBC
HEMATOCRIT: 38.1 % (ref 36.0–46.0)
Hemoglobin: 12.2 g/dL (ref 12.0–15.0)
MCHC: 32.1 g/dL (ref 30.0–36.0)
MCV: 81.4 fl (ref 78.0–100.0)
Platelets: 311 10*3/uL (ref 150.0–400.0)
RBC: 4.68 Mil/uL (ref 3.87–5.11)
RDW: 14.9 % (ref 11.5–15.5)
WBC: 10.7 10*3/uL — ABNORMAL HIGH (ref 4.0–10.5)

## 2016-08-14 ENCOUNTER — Encounter: Payer: Self-pay | Admitting: Family Medicine

## 2016-08-14 ENCOUNTER — Other Ambulatory Visit: Payer: Self-pay | Admitting: Internal Medicine

## 2016-08-14 MED FILL — CLOPIDOGREL 75 MG TABLET: 75 | 90 days supply | Qty: 90 | Fill #0

## 2016-08-14 NOTE — Telephone Encounter (Signed)
REFILL 

## 2016-08-15 ENCOUNTER — Other Ambulatory Visit: Payer: Self-pay | Admitting: Family Medicine

## 2016-08-15 ENCOUNTER — Encounter: Payer: Self-pay | Admitting: Family Medicine

## 2016-08-15 DIAGNOSIS — S82041A Displaced comminuted fracture of right patella, initial encounter for closed fracture: Secondary | ICD-10-CM | POA: Diagnosis not present

## 2016-08-15 DIAGNOSIS — S82001A Unspecified fracture of right patella, initial encounter for closed fracture: Secondary | ICD-10-CM | POA: Diagnosis not present

## 2016-08-15 DIAGNOSIS — G8911 Acute pain due to trauma: Secondary | ICD-10-CM | POA: Diagnosis not present

## 2016-08-15 DIAGNOSIS — M79605 Pain in left leg: Secondary | ICD-10-CM

## 2016-08-15 DIAGNOSIS — S82091A Other fracture of right patella, initial encounter for closed fracture: Secondary | ICD-10-CM

## 2016-08-15 DIAGNOSIS — M1990 Unspecified osteoarthritis, unspecified site: Secondary | ICD-10-CM | POA: Diagnosis not present

## 2016-08-15 DIAGNOSIS — M25562 Pain in left knee: Secondary | ICD-10-CM

## 2016-08-15 DIAGNOSIS — D72829 Elevated white blood cell count, unspecified: Secondary | ICD-10-CM

## 2016-08-15 DIAGNOSIS — S0990XA Unspecified injury of head, initial encounter: Secondary | ICD-10-CM | POA: Diagnosis not present

## 2016-08-15 DIAGNOSIS — M25561 Pain in right knee: Secondary | ICD-10-CM | POA: Diagnosis not present

## 2016-08-15 DIAGNOSIS — M545 Low back pain, unspecified: Secondary | ICD-10-CM

## 2016-08-15 DIAGNOSIS — R030 Elevated blood-pressure reading, without diagnosis of hypertension: Secondary | ICD-10-CM | POA: Diagnosis not present

## 2016-08-15 DIAGNOSIS — W01198A Fall on same level from slipping, tripping and stumbling with subsequent striking against other object, initial encounter: Secondary | ICD-10-CM | POA: Diagnosis not present

## 2016-08-15 DIAGNOSIS — R51 Headache: Secondary | ICD-10-CM | POA: Diagnosis not present

## 2016-08-15 DIAGNOSIS — Z7902 Long term (current) use of antithrombotics/antiplatelets: Secondary | ICD-10-CM | POA: Diagnosis not present

## 2016-08-15 DIAGNOSIS — R52 Pain, unspecified: Secondary | ICD-10-CM | POA: Diagnosis not present

## 2016-08-16 ENCOUNTER — Encounter: Payer: Self-pay | Admitting: Family Medicine

## 2016-08-16 MED ORDER — HYDROCODONE-ACETAMINOPHEN 5-325 MG PO TABS: 1.0000 | ORAL_TABLET | Freq: Four times a day (QID) | ORAL | 0 refills | Status: DC | PRN

## 2016-08-16 MED ORDER — CYCLOBENZAPRINE HCL 10 MG PO TABS: 10.0000 mg | ORAL_TABLET | Freq: Two times a day (BID) | ORAL | 0 refills | Status: DC | PRN

## 2016-08-16 MED FILL — ONDANSETRON ODT 4 MG TABLET: 4 | 7 days supply | Qty: 20 | Fill #0

## 2016-08-16 MED FILL — CYCLOBENZAPRINE 10 MG TAB: 10 | 15 days supply | Qty: 30 | Fill #0 | Status: TO

## 2016-08-16 MED FILL — HYDROCODON-APAP 7.5-325: 7.5-325 | 2 days supply | Qty: 12 | Fill #0

## 2016-08-16 NOTE — Telephone Encounter (Addendum)
Relation to pt: self  Call back number:[phone] (M)   Reason for call:  Patient requesting referral to Dr. Landry DykeLucey [place] due to patient fracturing her patella, pleas advise

## 2016-08-16 NOTE — Telephone Encounter (Signed)
NCCSR:  I gave her 20 hydrocodone on 3/14 She reports getting 10 hydrocodone from the ER but these are not reported yet.   Called pt- will place referral right now.  We are going into a long weekend and she will not see ortho for several days. We are not sure how she will do for 4+ days with a fractured patella and 10 pain pills. Will write her an rx that she can pick up before we close today.  She would like to try and use flexeril for pain so she can alternate with the hydrocodone- this is fine.  Reminded not to use both medications together

## 2016-08-16 NOTE — Telephone Encounter (Signed)
error:[id number] ° °

## 2016-08-21 MED FILL — HYDROCODON-APAP 5-325: 5-325 | 2 days supply | Qty: 20 | Fill #0

## 2016-08-22 ENCOUNTER — Encounter: Payer: Self-pay | Admitting: Family Medicine

## 2016-08-22 DIAGNOSIS — S82091D Other fracture of right patella, subsequent encounter for closed fracture with routine healing: Secondary | ICD-10-CM | POA: Diagnosis not present

## 2016-08-23 ENCOUNTER — Telehealth: Payer: Self-pay | Admitting: Internal Medicine

## 2016-08-23 DIAGNOSIS — S82091D Other fracture of right patella, subsequent encounter for closed fracture with routine healing: Secondary | ICD-10-CM | POA: Diagnosis not present

## 2016-08-23 NOTE — Telephone Encounter (Signed)
Acceptable risk for surgery. Hold aspirin 7 days prior if necessary and plavix 5 days prior.  Dr. HRexene Edison

## 2016-08-23 NOTE — Telephone Encounter (Signed)
Patient dropped off walk-in form on 08/22/16 with surgical clearance request info denoted below. States she broke/crushed right knee and it will require surgery by [facility]...  1. Type of surgery: ORIF patella and/or total knee arthroplasty - right 2. Date of surgery: TBD 3. Surgeon: Dr. Arlys JohnBrian Swinteck 4. Medications that need to be held & how long: on aspirin + plavix 5. Fax and/or Phone: [facility]

## 2016-08-23 NOTE — Telephone Encounter (Signed)
Patient called with clearance information. Clearance note [facility]

## 2016-08-25 ENCOUNTER — Ambulatory Visit: Payer: Self-pay | Admitting: Orthopedic Surgery

## 2016-08-26 ENCOUNTER — Ambulatory Visit: Payer: PPO | Admitting: Family Medicine

## 2016-08-26 NOTE — Progress Notes (Signed)
08-23-16 (EPIC) Surgical clearance per Dr. Rennis GoldenHilty 01-25-16 [facility](EPIC) EKG

## 2016-08-26 NOTE — Patient Instructions (Addendum)
[name]  08/26/2016   Your procedure is scheduled on: 08-29-16   Report to [facility] Main  Entrance Take [place] Elevators to 3rd floor to  Short Stay Center at 2:00 PM.   Call this number if you have problems the morning of surgery [phone]    Remember: ONLY 1 PERSON MAY GO WITH YOU TO SHORT STAY TO GET  READY MORNING OF YOUR SURGERY.  Do not eat food or drink liquids :After Midnight.You may have a clear liquid diet from midnight until 10:00 AM. After 10:00 AM, nothing by mouth     CLEAR LIQUID DIET   Foods Allowed                                                                     Foods Excluded  Coffee and tea, regular and decaf                             liquids that you cannot  Plain Jell-O in any flavor                                             see through such as: Fruit ices (not with fruit pulp)                                     milk, soups, orange juice  Iced Popsicles                                    All solid food Carbonated beverages, regular and diet                                    Cranberry, grape and apple juices Sports drinks like Gatorade Lightly seasoned clear broth or consume(fat free) Sugar, honey syrup  Sample Menu Breakfast                                Lunch                                     Supper Cranberry juice                    Beef broth                            Chicken broth Jell-O                                     Grape juice  Apple juice Coffee or tea                        Jell-O                                      Popsicle                                                Coffee or tea                        Coffee or tea  _____________________________________________________________________     Take these medicines the morning of surgery with A SIP OF WATER: Famotidine (Pepcid), Metoprolol Succinate (Toprol-XL), Cyclobenzaprine (Flexiril), and   Your pain medication as  needed.  DO NOT TAKE ANY DIABETIC MEDICATIONS DAY OF YOUR SURGERY                                You may not have any metal on your body including hair pins and              piercings  Do not wear jewelry, make-up, lotions, powders or perfumes, deodorant             Do not wear nail polish.  Do not shave  48 hours prior to surgery.                 Do not bring valuables to the hospital. [facility] IS NOT             RESPONSIBLE   FOR VALUABLES.  Contacts, dentures or bridgework may not be worn into surgery.  Leave suitcase in the car. After surgery it may be brought to your room.                  Please read over the following fact sheets you were given: _____________________________________________________________________  How to Manage Your Diabetes Before and After Surgery  Why is it important to control my blood sugar before and after surgery? . Improving blood sugar levels before and after surgery helps healing and can limit problems. . A way of improving blood sugar control is eating a healthy diet by: o  Eating less sugar and carbohydrates o  Increasing activity/exercise o  Talking with your doctor about reaching your blood sugar goals . High blood sugars (greater than 180 mg/dL) can raise your risk of infections and slow your recovery, so you will need to focus on controlling your diabetes during the weeks before surgery. . Make sure that the doctor who takes care of your diabetes knows about your planned surgery including the date and location.  How do I manage my blood sugar before surgery? . Check your blood sugar at least 4 times a day, starting 2 days before surgery, to make sure that the level is not too high or low. o Check your blood sugar the morning of your surgery when you wake up and every 2 hours until you get to the Short Stay unit. . If your blood sugar is less than 70 mg/dL, you will need to treat for low blood sugar: o Do not take  insulin. o Treat a low  blood sugar (less than 70 mg/dL) with  cup of clear juice (cranberry or apple), 4 glucose tablets, OR glucose gel. o Recheck blood sugar in 15 minutes after treatment (to make sure it is greater than 70 mg/dL). If your blood sugar is not greater than 70 mg/dL on recheck, call [phone] for further instructions. . Report your blood sugar to the short stay nurse when you get to Short Stay.  . If you are admitted to the hospital after surgery: o Your blood sugar will be checked by the staff and you will probably be given insulin after surgery (instead of oral diabetes medicines) to make sure you have good blood sugar levels. o The goal for blood sugar control after surgery is 80-180 mg/dL.   WHAT DO I DO ABOUT MY DIABETES MEDICATION?  Marland Kitchen. Do not take oral diabetes medicines (pills) the morning of surgery.  . THE DAY BEFORE SURGERY, take your usual dose of Metformin        Patient Signature:  Date:   Nurse Signature:  Date:   Reviewed and Endorsed by [facility] Patient Education Committee, August 2015  [facility] - Preparing for Surgery Before surgery, you can play an important role.  Because skin is not sterile, your skin needs to be as free of germs as possible.  You can reduce the number of germs on your skin by washing with CHG (chlorahexidine gluconate) soap before surgery.  CHG is an antiseptic cleaner which kills germs and bonds with the skin to continue killing germs even after washing. Please DO NOT use if you have an allergy to CHG or antibacterial soaps.  If your skin becomes reddened/irritated stop using the CHG and inform your nurse when you arrive at Short Stay. Do not shave (including legs and underarms) for at least 48 hours prior to the first CHG shower.  You may shave your face/neck. Please follow these instructions carefully:  1.  Shower with CHG Soap the night before surgery and the  morning of Surgery.  2.  If you choose to wash your hair, wash your hair first as  usual with your  normal  shampoo.  3.  After you shampoo, rinse your hair and body thoroughly to remove the  shampoo.                           4.  Use CHG as you would any other liquid soap.  You can apply chg directly  to the skin and wash                       Gently with a scrungie or clean washcloth.  5.  Apply the CHG Soap to your body ONLY FROM THE NECK DOWN.   Do not use on face/ open                           Wound or open sores. Avoid contact with eyes, ears mouth and genitals (private parts).                       Wash face,  Genitals (private parts) with your normal soap.             6.  Wash thoroughly, paying special attention to the area where your surgery  will be performed.  7.  Thoroughly rinse your  body with warm water from the neck down.  8.  DO NOT shower/wash with your normal soap after using and rinsing off  the CHG Soap.                9.  Pat yourself dry with a clean towel.            10.  Wear clean pajamas.            11.  Place clean sheets on your bed the night of your first shower and do not  sleep with pets. Day of Surgery : Do not apply any lotions/deodorants the morning of surgery.  Please wear clean clothes to the hospital/surgery center.  FAILURE TO FOLLOW THESE INSTRUCTIONS MAY RESULT IN THE CANCELLATION OF YOUR SURGERY PATIENT SIGNATURE_________________________________  NURSE SIGNATURE__________________________________  ________________________________________________________________________   Rogelia Mire  An incentive spirometer is a tool that can help keep your lungs clear and active. This tool measures how well you are filling your lungs with each breath. Taking long deep breaths may help reverse or decrease the chance of developing breathing (pulmonary) problems (especially infection) following:  A long period of time when you are unable to move or be active. BEFORE THE PROCEDURE   If the spirometer includes an indicator to show your  best effort, your nurse or respiratory therapist will set it to a desired goal.  If possible, sit up straight or lean slightly forward. Try not to slouch.  Hold the incentive spirometer in an upright position. INSTRUCTIONS FOR USE  1. Sit on the edge of your bed if possible, or sit up as far as you can in bed or on a chair. 2. Hold the incentive spirometer in an upright position. 3. Breathe out normally. 4. Place the mouthpiece in your mouth and seal your lips tightly around it. 5. Breathe in slowly and as deeply as possible, raising the piston or the ball toward the top of the column. 6. Hold your breath for 3-5 seconds or for as long as possible. Allow the piston or ball to fall to the bottom of the column. 7. Remove the mouthpiece from your mouth and breathe out normally. 8. Rest for a few seconds and repeat Steps 1 through 7 at least 10 times every 1-2 hours when you are awake. Take your time and take a few normal breaths between deep breaths. 9. The spirometer may include an indicator to show your best effort. Use the indicator as a goal to work toward during each repetition. 10. After each set of 10 deep breaths, practice coughing to be sure your lungs are clear. If you have an incision (the cut made at the time of surgery), support your incision when coughing by placing a pillow or rolled up towels firmly against it. Once you are able to get out of bed, walk around indoors and cough well. You may stop using the incentive spirometer when instructed by your caregiver.  RISKS AND COMPLICATIONS  Take your time so you do not get dizzy or light-headed.  If you are in pain, you may need to take or ask for pain medication before doing incentive spirometry. It is harder to take a deep breath if you are having pain. AFTER USE  Rest and breathe slowly and easily.  It can be helpful to keep track of a log of your progress. Your caregiver can provide you with a simple table to help with this. If  you are using the spirometer at home,  follow these instructions: SEEK MEDICAL CARE IF:   You are having difficultly using the spirometer.  You have trouble using the spirometer as often as instructed.  Your pain medication is not giving enough relief while using the spirometer.  You develop fever of 100.5 F (38.1 C) or higher. SEEK IMMEDIATE MEDICAL CARE IF:   You cough up bloody sputum that had not been present before.  You develop fever of 102 F (38.9 C) or greater.  You develop worsening pain at or near the incision site. MAKE SURE YOU:   Understand these instructions.  Will watch your condition.  Will get help right away if you are not doing well or get worse. Document Released: 07/22/2006 Document Revised: 06/03/2011 Document Reviewed: 09/22/2006 [facility] Patient Information 2014 [place], [place].   ________________________________________________________________________

## 2016-08-27 ENCOUNTER — Other Ambulatory Visit (HOSPITAL_COMMUNITY): Payer: PPO

## 2016-08-27 ENCOUNTER — Encounter (HOSPITAL_COMMUNITY)
Admission: RE | Admit: 2016-08-27 | Discharge: 2016-08-27 | Disposition: A | Discharge status: Home / Self Care | Payer: PPO | Source: Ambulatory Visit | Attending: Orthopedic Surgery | Admitting: Orthopedic Surgery

## 2016-08-27 ENCOUNTER — Encounter (HOSPITAL_COMMUNITY): Payer: Self-pay

## 2016-08-27 DIAGNOSIS — Z955 Presence of coronary angioplasty implant and graft: Secondary | ICD-10-CM | POA: Diagnosis not present

## 2016-08-27 DIAGNOSIS — I2510 Atherosclerotic heart disease of native coronary artery without angina pectoris: Secondary | ICD-10-CM | POA: Diagnosis not present

## 2016-08-27 DIAGNOSIS — Y929 Unspecified place or not applicable: Secondary | ICD-10-CM | POA: Diagnosis not present

## 2016-08-27 DIAGNOSIS — E119 Type 2 diabetes mellitus without complications: Secondary | ICD-10-CM | POA: Diagnosis not present

## 2016-08-27 DIAGNOSIS — X58XXXA Exposure to other specified factors, initial encounter: Secondary | ICD-10-CM | POA: Diagnosis not present

## 2016-08-27 DIAGNOSIS — S82041A Displaced comminuted fracture of right patella, initial encounter for closed fracture: Secondary | ICD-10-CM | POA: Diagnosis not present

## 2016-08-27 DIAGNOSIS — K219 Gastro-esophageal reflux disease without esophagitis: Secondary | ICD-10-CM | POA: Diagnosis not present

## 2016-08-27 DIAGNOSIS — Z7982 Long term (current) use of aspirin: Secondary | ICD-10-CM | POA: Diagnosis not present

## 2016-08-27 DIAGNOSIS — I10 Essential (primary) hypertension: Secondary | ICD-10-CM | POA: Diagnosis not present

## 2016-08-27 DIAGNOSIS — G4730 Sleep apnea, unspecified: Secondary | ICD-10-CM | POA: Diagnosis not present

## 2016-08-27 DIAGNOSIS — Y939 Activity, unspecified: Secondary | ICD-10-CM | POA: Diagnosis not present

## 2016-08-27 DIAGNOSIS — Z7984 Long term (current) use of oral hypoglycemic drugs: Secondary | ICD-10-CM | POA: Diagnosis not present

## 2016-08-27 DIAGNOSIS — E785 Hyperlipidemia, unspecified: Secondary | ICD-10-CM | POA: Diagnosis not present

## 2016-08-27 DIAGNOSIS — Z79899 Other long term (current) drug therapy: Secondary | ICD-10-CM | POA: Diagnosis not present

## 2016-08-27 LAB — BASIC METABOLIC PANEL
Anion gap: 7 (ref 5–15)
BUN: 17 mg/dL (ref 6–20)
CO2: 28 mmol/L (ref 22–32)
CREATININE: 0.73 mg/dL (ref 0.44–1.00)
Calcium: 10 mg/dL (ref 8.9–10.3)
Chloride: 104 mmol/L (ref 101–111)
GFR calc Af Amer: >60 mL/min (ref >60)
GFR calc non Af Amer: >60 mL/min (ref >60)
GLUCOSE: 137 mg/dL — AB (ref 65–99)
Potassium: 4.4 mmol/L (ref 3.5–5.1)
Sodium: 139 mmol/L (ref 135–145)

## 2016-08-27 LAB — CBC
HCT: 37.3 % (ref 36.0–46.0)
Hemoglobin: 11.9 g/dL — ABNORMAL LOW (ref 12.0–15.0)
MCH: 26 pg (ref 26.0–34.0)
MCHC: 31.9 g/dL (ref 30.0–36.0)
MCV: 81.4 fL (ref 78.0–100.0)
PLATELETS: 339 10*3/uL (ref 150–400)
RBC: 4.58 MIL/uL (ref 3.87–5.11)
RDW: 14.1 % (ref 11.5–15.5)
WBC: 12.1 10*3/uL — ABNORMAL HIGH (ref 4.0–10.5)

## 2016-08-27 LAB — SURGICAL PCR SCREEN
MRSA, PCR: NEGATIVE
Staphylococcus aureus: POSITIVE — AB

## 2016-08-27 LAB — GLUCOSE, CAPILLARY: Glucose-Capillary: 161 mg/dL — ABNORMAL HIGH (ref 65–99)

## 2016-08-28 ENCOUNTER — Ambulatory Visit: Payer: PPO | Admitting: Orthotics

## 2016-08-28 LAB — HEMOGLOBIN A1C
Hgb A1c MFr Bld: 6.8 % — ABNORMAL HIGH (ref 4.8–5.6)
MEAN PLASMA GLUCOSE: 148 mg/dL

## 2016-08-28 NOTE — Telephone Encounter (Signed)
Printed clearance note manually faxed to [facility].

## 2016-08-29 ENCOUNTER — Ambulatory Visit (HOSPITAL_COMMUNITY): Payer: PPO | Admitting: Certified Registered Nurse Anesthetist

## 2016-08-29 ENCOUNTER — Encounter (HOSPITAL_COMMUNITY)
Admission: RE | Disposition: A | Discharge status: Home / Self Care | Payer: Self-pay | Source: Ambulatory Visit | Attending: Orthopedic Surgery

## 2016-08-29 ENCOUNTER — Observation Stay (HOSPITAL_COMMUNITY): Payer: PPO

## 2016-08-29 ENCOUNTER — Encounter (HOSPITAL_COMMUNITY): Payer: Self-pay | Admitting: *Deleted

## 2016-08-29 ENCOUNTER — Observation Stay (HOSPITAL_COMMUNITY)
Admission: RE | Admit: 2016-08-29 | Discharge: 2016-08-30 | Disposition: A | Discharge status: Home / Self Care | Payer: PPO | Source: Ambulatory Visit | Attending: Orthopedic Surgery | Admitting: Orthopedic Surgery

## 2016-08-29 DIAGNOSIS — S82001A Unspecified fracture of right patella, initial encounter for closed fracture: Secondary | ICD-10-CM | POA: Diagnosis present

## 2016-08-29 DIAGNOSIS — M48062 Spinal stenosis, lumbar region with neurogenic claudication: Secondary | ICD-10-CM | POA: Diagnosis not present

## 2016-08-29 DIAGNOSIS — E119 Type 2 diabetes mellitus without complications: Secondary | ICD-10-CM | POA: Insufficient documentation

## 2016-08-29 DIAGNOSIS — G8918 Other acute postprocedural pain: Secondary | ICD-10-CM | POA: Diagnosis not present

## 2016-08-29 DIAGNOSIS — G4730 Sleep apnea, unspecified: Secondary | ICD-10-CM | POA: Insufficient documentation

## 2016-08-29 DIAGNOSIS — M62838 Other muscle spasm: Secondary | ICD-10-CM

## 2016-08-29 DIAGNOSIS — E785 Hyperlipidemia, unspecified: Secondary | ICD-10-CM | POA: Insufficient documentation

## 2016-08-29 DIAGNOSIS — Z7984 Long term (current) use of oral hypoglycemic drugs: Secondary | ICD-10-CM | POA: Diagnosis not present

## 2016-08-29 DIAGNOSIS — I10 Essential (primary) hypertension: Secondary | ICD-10-CM | POA: Insufficient documentation

## 2016-08-29 DIAGNOSIS — S82041A Displaced comminuted fracture of right patella, initial encounter for closed fracture: Secondary | ICD-10-CM | POA: Diagnosis not present

## 2016-08-29 DIAGNOSIS — I2510 Atherosclerotic heart disease of native coronary artery without angina pectoris: Secondary | ICD-10-CM | POA: Insufficient documentation

## 2016-08-29 DIAGNOSIS — Z09 Encounter for follow-up examination after completed treatment for conditions other than malignant neoplasm: Secondary | ICD-10-CM

## 2016-08-29 DIAGNOSIS — Z419 Encounter for procedure for purposes other than remedying health state, unspecified: Secondary | ICD-10-CM

## 2016-08-29 DIAGNOSIS — Z7982 Long term (current) use of aspirin: Secondary | ICD-10-CM | POA: Diagnosis not present

## 2016-08-29 DIAGNOSIS — Z79899 Other long term (current) drug therapy: Secondary | ICD-10-CM | POA: Insufficient documentation

## 2016-08-29 DIAGNOSIS — X58XXXA Exposure to other specified factors, initial encounter: Secondary | ICD-10-CM | POA: Insufficient documentation

## 2016-08-29 DIAGNOSIS — K219 Gastro-esophageal reflux disease without esophagitis: Secondary | ICD-10-CM | POA: Insufficient documentation

## 2016-08-29 DIAGNOSIS — Y929 Unspecified place or not applicable: Secondary | ICD-10-CM | POA: Insufficient documentation

## 2016-08-29 DIAGNOSIS — S82091A Other fracture of right patella, initial encounter for closed fracture: Secondary | ICD-10-CM | POA: Diagnosis not present

## 2016-08-29 DIAGNOSIS — Y939 Activity, unspecified: Secondary | ICD-10-CM | POA: Insufficient documentation

## 2016-08-29 DIAGNOSIS — Z955 Presence of coronary angioplasty implant and graft: Secondary | ICD-10-CM | POA: Insufficient documentation

## 2016-08-29 HISTORY — PX: ORIF PATELLA: SHX5033

## 2016-08-29 LAB — GLUCOSE, CAPILLARY
GLUCOSE-CAPILLARY: 196 mg/dL — AB (ref 65–99)
Glucose-Capillary: 94 mg/dL (ref 65–99)
Glucose-Capillary: 158 mg/dL — ABNORMAL HIGH (ref 65–99)

## 2016-08-29 SURGERY — OPEN REDUCTION INTERNAL FIXATION (ORIF) PATELLA
Anesthesia: General | Site: Knee | Laterality: Right

## 2016-08-29 MED ORDER — PROMETHAZINE HCL 25 MG/ML IJ SOLN: 6.2500 mg | INTRAMUSCULAR | Status: DC | PRN

## 2016-08-29 MED ORDER — FENTANYL CITRATE (PF) 100 MCG/2ML IJ SOLN
100.0000 ug | Freq: Once | INTRAMUSCULAR | Status: AC
  Administered 2016-08-29 (×2): 50 ug via INTRAVENOUS

## 2016-08-29 MED ORDER — EPHEDRINE 5 MG/ML INJ
INTRAVENOUS | Status: AC
  Filled 2016-08-29: qty 10

## 2016-08-29 MED ORDER — ONDANSETRON HCL 4 MG/2ML IJ SOLN: 4.0000 mg | Freq: Four times a day (QID) | INTRAMUSCULAR | Status: DC | PRN

## 2016-08-29 MED ORDER — HYDROMORPHONE HCL 1 MG/ML IJ SOLN: 0.2500 mg | INTRAMUSCULAR | Status: DC | PRN

## 2016-08-29 MED ORDER — MIDAZOLAM HCL 2 MG/2ML IJ SOLN
2.0000 mg | Freq: Once | INTRAMUSCULAR | Status: AC
  Administered 2016-08-29 (×2): 1 mg via INTRAVENOUS

## 2016-08-29 MED ORDER — PHENYLEPHRINE 40 MCG/ML (10ML) SYRINGE FOR IV PUSH (FOR BLOOD PRESSURE SUPPORT)
PREFILLED_SYRINGE | INTRAVENOUS | Status: AC
  Filled 2016-08-29: qty 10

## 2016-08-29 MED ORDER — ONDANSETRON HCL 4 MG PO TABS: 4.0000 mg | ORAL_TABLET | Freq: Four times a day (QID) | ORAL | Status: DC | PRN

## 2016-08-29 MED ORDER — DIPHENHYDRAMINE HCL 12.5 MG/5ML PO ELIX: 12.5000 mg | ORAL_SOLUTION | ORAL | Status: DC | PRN

## 2016-08-29 MED ORDER — ROCURONIUM BROMIDE 100 MG/10ML IV SOLN
INTRAVENOUS | Status: DC | PRN
  Administered 2016-08-29: 40 mg via INTRAVENOUS

## 2016-08-29 MED ORDER — DEXAMETHASONE SODIUM PHOSPHATE 10 MG/ML IJ SOLN
INTRAMUSCULAR | Status: AC
  Filled 2016-08-29: qty 1

## 2016-08-29 MED ORDER — PHENYLEPHRINE HCL 10 MG/ML IJ SOLN
INTRAVENOUS | Status: DC | PRN
  Administered 2016-08-29: 100 ug/min via INTRAVENOUS

## 2016-08-29 MED ORDER — SUGAMMADEX SODIUM 200 MG/2ML IV SOLN
INTRAVENOUS | Status: AC
  Filled 2016-08-29: qty 2

## 2016-08-29 MED ORDER — DEXAMETHASONE SODIUM PHOSPHATE 4 MG/ML IJ SOLN
INTRAMUSCULAR | Status: DC | PRN
  Administered 2016-08-29: 4 mg via INTRAVENOUS

## 2016-08-29 MED ORDER — CEFAZOLIN SODIUM-DEXTROSE 2-4 GM/100ML-% IV SOLN
2.0000 g | Freq: Four times a day (QID) | INTRAVENOUS | Status: AC
  Administered 2016-08-29 – 2016-08-30 (×3): 2 g via INTRAVENOUS
  Filled 2016-08-29 (×3): qty 100

## 2016-08-29 MED ORDER — DOCUSATE SODIUM 100 MG PO CAPS
100.0000 mg | ORAL_CAPSULE | Freq: Two times a day (BID) | ORAL | Status: DC
  Administered 2016-08-29 – 2016-08-30 (×2): 100 mg via ORAL
  Filled 2016-08-29 (×2): qty 1

## 2016-08-29 MED ORDER — HYDROMORPHONE HCL 1 MG/ML IJ SOLN
0.5000 mg | INTRAMUSCULAR | Status: DC | PRN
  Administered 2016-08-30: 0.5 mg via INTRAVENOUS
  Filled 2016-08-29: qty 0.5
  Filled 2016-08-29: qty 1

## 2016-08-29 MED ORDER — POLYETHYLENE GLYCOL 3350 17 G PO PACK: 17.0000 g | PACK | Freq: Every day | ORAL | Status: DC | PRN

## 2016-08-29 MED ORDER — FENTANYL CITRATE (PF) 100 MCG/2ML IJ SOLN
INTRAMUSCULAR | Status: AC
  Filled 2016-08-29: qty 2

## 2016-08-29 MED ORDER — ACETAMINOPHEN 10 MG/ML IV SOLN
INTRAVENOUS | Status: AC
  Filled 2016-08-29: qty 100

## 2016-08-29 MED ORDER — SENNA 8.6 MG PO TABS
1.0000 | ORAL_TABLET | Freq: Two times a day (BID) | ORAL | Status: DC
  Administered 2016-08-29 – 2016-08-30 (×2): 8.6 mg via ORAL
  Filled 2016-08-29 (×2): qty 1

## 2016-08-29 MED ORDER — FENTANYL CITRATE (PF) 100 MCG/2ML IJ SOLN
INTRAMUSCULAR | Status: AC
  Administered 2016-08-29: 50 ug via INTRAVENOUS
  Filled 2016-08-29: qty 2

## 2016-08-29 MED ORDER — ACETAMINOPHEN 650 MG RE SUPP: 650.0000 mg | Freq: Four times a day (QID) | RECTAL | Status: DC | PRN

## 2016-08-29 MED ORDER — PROPOFOL 10 MG/ML IV BOLUS
INTRAVENOUS | Status: AC
  Filled 2016-08-29: qty 20

## 2016-08-29 MED ORDER — SIMVASTATIN 20 MG PO TABS
40.0000 mg | ORAL_TABLET | Freq: Every evening | ORAL | Status: DC
  Administered 2016-08-29: 40 mg via ORAL
  Filled 2016-08-29: qty 2

## 2016-08-29 MED ORDER — SUCCINYLCHOLINE CHLORIDE 200 MG/10ML IV SOSY
PREFILLED_SYRINGE | INTRAVENOUS | Status: DC | PRN
  Administered 2016-08-29: 120 mg via INTRAVENOUS

## 2016-08-29 MED ORDER — LACTATED RINGERS IV SOLN
INTRAVENOUS | Status: DC
  Administered 2016-08-29 (×2): via INTRAVENOUS

## 2016-08-29 MED ORDER — IRBESARTAN 150 MG PO TABS
150.0000 mg | ORAL_TABLET | Freq: Every day | ORAL | Status: DC
  Administered 2016-08-30: 150 mg via ORAL
  Filled 2016-08-29: qty 1

## 2016-08-29 MED ORDER — PROPOFOL 10 MG/ML IV BOLUS
INTRAVENOUS | Status: DC | PRN
  Administered 2016-08-29: 150 mg via INTRAVENOUS

## 2016-08-29 MED ORDER — METHOCARBAMOL 500 MG PO TABS
500.0000 mg | ORAL_TABLET | Freq: Four times a day (QID) | ORAL | Status: DC | PRN
  Administered 2016-08-30 (×2): 500 mg via ORAL
  Filled 2016-08-29 (×2): qty 1

## 2016-08-29 MED ORDER — METOCLOPRAMIDE HCL 5 MG PO TABS
5.0000 mg | ORAL_TABLET | Freq: Three times a day (TID) | ORAL | Status: DC | PRN
Start: 2016-08-29 — End: 2016-08-30

## 2016-08-29 MED ORDER — ONDANSETRON HCL 4 MG/2ML IJ SOLN
INTRAMUSCULAR | Status: AC
  Filled 2016-08-29: qty 2

## 2016-08-29 MED ORDER — HYDROCODONE-ACETAMINOPHEN 5-325 MG PO TABS
1.0000 | ORAL_TABLET | ORAL | Status: DC | PRN
  Administered 2016-08-29 (×2): 1 via ORAL
  Administered 2016-08-30 (×3): 2 via ORAL
  Filled 2016-08-29 (×2): qty 2
  Filled 2016-08-29: qty 1
  Filled 2016-08-29: qty 2
  Filled 2016-08-29: qty 1

## 2016-08-29 MED ORDER — CLOPIDOGREL BISULFATE 75 MG PO TABS
75.0000 mg | ORAL_TABLET | Freq: Every day | ORAL | Status: DC
  Administered 2016-08-30: 75 mg via ORAL
  Filled 2016-08-29: qty 1

## 2016-08-29 MED ORDER — CHLORHEXIDINE GLUCONATE 4 % EX LIQD: 60.0000 mL | Freq: Once | CUTANEOUS | Status: DC

## 2016-08-29 MED ORDER — CEFAZOLIN SODIUM-DEXTROSE 2-4 GM/100ML-% IV SOLN
2.0000 g | INTRAVENOUS | Status: AC
  Administered 2016-08-29: 2 g via INTRAVENOUS

## 2016-08-29 MED ORDER — MIDAZOLAM HCL 2 MG/2ML IJ SOLN
INTRAMUSCULAR | Status: AC
  Administered 2016-08-29: 1 mg via INTRAVENOUS
  Filled 2016-08-29: qty 2

## 2016-08-29 MED ORDER — SUCCINYLCHOLINE CHLORIDE 200 MG/10ML IV SOSY
PREFILLED_SYRINGE | INTRAVENOUS | Status: AC
  Filled 2016-08-29: qty 10

## 2016-08-29 MED ORDER — FENTANYL CITRATE (PF) 100 MCG/2ML IJ SOLN
INTRAMUSCULAR | Status: DC | PRN
Start: 2016-08-29 — End: 2016-08-29
  Administered 2016-08-29 (×4): 25 ug via INTRAVENOUS

## 2016-08-29 MED ORDER — PHENYLEPHRINE HCL 10 MG/ML IJ SOLN
INTRAMUSCULAR | Status: AC
  Filled 2016-08-29: qty 1

## 2016-08-29 MED ORDER — SUGAMMADEX SODIUM 200 MG/2ML IV SOLN
INTRAVENOUS | Status: DC | PRN
  Administered 2016-08-29: 200 mg via INTRAVENOUS

## 2016-08-29 MED ORDER — KETOROLAC TROMETHAMINE 15 MG/ML IJ SOLN
7.5000 mg | Freq: Four times a day (QID) | INTRAMUSCULAR | Status: DC
  Administered 2016-08-30 (×3): 7.5 mg via INTRAVENOUS
  Filled 2016-08-29 (×3): qty 1

## 2016-08-29 MED ORDER — EPHEDRINE SULFATE 50 MG/ML IJ SOLN
INTRAMUSCULAR | Status: DC | PRN
  Administered 2016-08-29 (×3): 10 mg via INTRAVENOUS

## 2016-08-29 MED ORDER — ROPIVACAINE HCL 5 MG/ML IJ SOLN
INTRAMUSCULAR | Status: DC | PRN
  Administered 2016-08-29: 30 mL via PERINEURAL

## 2016-08-29 MED ORDER — METFORMIN HCL ER 500 MG PO TB24
500.0000 mg | ORAL_TABLET | Freq: Every day | ORAL | Status: DC
  Administered 2016-08-30: 500 mg via ORAL
  Filled 2016-08-29: qty 1

## 2016-08-29 MED ORDER — LIDOCAINE HCL (CARDIAC) 20 MG/ML IV SOLN
INTRAVENOUS | Status: DC | PRN
  Administered 2016-08-29: 80 mg via INTRAVENOUS

## 2016-08-29 MED ORDER — METHOCARBAMOL 1000 MG/10ML IJ SOLN
500.0000 mg | Freq: Four times a day (QID) | INTRAVENOUS | Status: DC | PRN
  Administered 2016-08-29: 500 mg via INTRAVENOUS
  Filled 2016-08-29: qty 5

## 2016-08-29 MED ORDER — METOPROLOL SUCCINATE ER 25 MG PO TB24
12.5000 mg | ORAL_TABLET | Freq: Every day | ORAL | Status: DC
  Administered 2016-08-30: 12.5 mg via ORAL
  Filled 2016-08-29: qty 1

## 2016-08-29 MED ORDER — ONDANSETRON HCL 4 MG/2ML IJ SOLN
INTRAMUSCULAR | Status: DC | PRN
Start: 2016-08-29 — End: 2016-08-29
  Administered 2016-08-29: 4 mg via INTRAVENOUS

## 2016-08-29 MED ORDER — ACETAMINOPHEN 10 MG/ML IV SOLN
1000.0000 mg | INTRAVENOUS | Status: AC
  Administered 2016-08-29: 1000 mg via INTRAVENOUS

## 2016-08-29 MED ORDER — PHENYLEPHRINE HCL 10 MG/ML IJ SOLN
INTRAMUSCULAR | Status: DC | PRN
  Administered 2016-08-29: 100 ug via INTRAVENOUS
  Administered 2016-08-29: 80 ug via INTRAVENOUS
  Administered 2016-08-29: 100 ug via INTRAVENOUS
  Administered 2016-08-29: 80 ug via INTRAVENOUS
  Administered 2016-08-29: 100 ug via INTRAVENOUS

## 2016-08-29 MED ORDER — ROCURONIUM BROMIDE 50 MG/5ML IV SOSY
PREFILLED_SYRINGE | INTRAVENOUS | Status: AC
  Filled 2016-08-29: qty 5

## 2016-08-29 MED ORDER — LORATADINE 10 MG PO TABS: 10.0000 mg | ORAL_TABLET | Freq: Every day | ORAL | Status: DC | PRN

## 2016-08-29 MED ORDER — METOCLOPRAMIDE HCL 5 MG/ML IJ SOLN: 5.0000 mg | Freq: Three times a day (TID) | INTRAMUSCULAR | Status: DC | PRN

## 2016-08-29 MED ORDER — FAMOTIDINE 20 MG PO TABS
20.0000 mg | ORAL_TABLET | Freq: Two times a day (BID) | ORAL | Status: DC
  Administered 2016-08-29 – 2016-08-30 (×2): 20 mg via ORAL
  Filled 2016-08-29 (×2): qty 1

## 2016-08-29 MED ORDER — ACETAMINOPHEN 325 MG PO TABS: 650.0000 mg | ORAL_TABLET | Freq: Four times a day (QID) | ORAL | Status: DC | PRN

## 2016-08-29 MED ORDER — ASPIRIN EC 81 MG PO TBEC
81.0000 mg | DELAYED_RELEASE_TABLET | Freq: Every day | ORAL | Status: DC
  Administered 2016-08-30: 81 mg via ORAL
  Filled 2016-08-29: qty 1

## 2016-08-29 MED ORDER — 0.9 % SODIUM CHLORIDE (POUR BTL) OPTIME
TOPICAL | Status: DC | PRN
  Administered 2016-08-29: 1000 mL

## 2016-08-29 MED ORDER — CEFAZOLIN SODIUM-DEXTROSE 2-4 GM/100ML-% IV SOLN
INTRAVENOUS | Status: AC
  Filled 2016-08-29: qty 100

## 2016-08-29 SURGICAL SUPPLY — 71 items
BAG SPEC THK2 15X12 ZIP CLS (MISCELLANEOUS) ×1
BAG ZIPLOCK 12X15 (MISCELLANEOUS) ×2 IMPLANT
BANDAGE ACE 6X5 VEL STRL LF (GAUZE/BANDAGES/DRESSINGS) ×1 IMPLANT
BANDAGE ESMARK 6X9 LF (GAUZE/BANDAGES/DRESSINGS) ×1 IMPLANT
BIT DRILL 2.9 CANN QC NONSTRL (BIT) ×1 IMPLANT
BNDG CMPR 9X6 STRL LF SNTH (GAUZE/BANDAGES/DRESSINGS) ×1
BNDG COHESIVE 6X5 TAN STRL LF (GAUZE/BANDAGES/DRESSINGS) ×2 IMPLANT
BNDG ESMARK 6X9 LF (GAUZE/BANDAGES/DRESSINGS) ×2
BNDG GAUZE ELAST 4 BULKY (GAUZE/BANDAGES/DRESSINGS) ×2 IMPLANT
CHLORAPREP W/TINT 26ML (MISCELLANEOUS) ×2 IMPLANT
COVER SURGICAL LIGHT HANDLE (MISCELLANEOUS) ×2 IMPLANT
CUFF TOURN SGL QUICK 34 (TOURNIQUET CUFF) ×2
CUFF TOURN SGL QUICK 44 (TOURNIQUET CUFF) IMPLANT
CUFF TRNQT CYL 34X4X40X1 (TOURNIQUET CUFF) IMPLANT
DRAPE C-ARM 42X120 X-RAY (DRAPES) ×1 IMPLANT
DRAPE C-ARMOR (DRAPES) ×1 IMPLANT
DRAPE EXTREMITY TIBURON (DRAPES) ×2 IMPLANT
DRAPE INCISE IOBAN 66X45 STRL (DRAPES) ×2 IMPLANT
DRAPE U-SHAPE 47X51 STRL (DRAPES) ×2 IMPLANT
DRESSING AQUACEL AG SP 3.5X10 (GAUZE/BANDAGES/DRESSINGS) IMPLANT
DRSG AQUACEL AG SP 3.5X10 (GAUZE/BANDAGES/DRESSINGS) ×2
DRSG EMULSION OIL 3X16 NADH (GAUZE/BANDAGES/DRESSINGS) ×2 IMPLANT
DRSG PAD ABDOMINAL 8X10 ST (GAUZE/BANDAGES/DRESSINGS) ×2 IMPLANT
ELECT REM PT RETURN 15FT ADLT (MISCELLANEOUS) ×2 IMPLANT
FACESHIELD WRAPAROUND (MASK) ×4 IMPLANT
FACESHIELD WRAPAROUND OR TEAM (MASK) IMPLANT
GAUZE SPONGE 4X4 12PLY STRL (GAUZE/BANDAGES/DRESSINGS) ×2 IMPLANT
GLOVE BIO SURGEON STRL SZ7 (GLOVE) ×1 IMPLANT
GLOVE BIO SURGEON STRL SZ7.5 (GLOVE) ×2 IMPLANT
GLOVE BIO SURGEON STRL SZ8.5 (GLOVE) ×4 IMPLANT
GLOVE BIOGEL PI IND STRL 7.0 (GLOVE) IMPLANT
GLOVE BIOGEL PI IND STRL 7.5 (GLOVE) IMPLANT
GLOVE BIOGEL PI IND STRL 8 (GLOVE) ×2 IMPLANT
GLOVE BIOGEL PI IND STRL 8.5 (GLOVE) ×1 IMPLANT
GLOVE BIOGEL PI INDICATOR 7.0 (GLOVE) ×1
GLOVE BIOGEL PI INDICATOR 7.5 (GLOVE) ×2
GLOVE BIOGEL PI INDICATOR 8 (GLOVE) ×2
GLOVE BIOGEL PI INDICATOR 8.5 (GLOVE) ×1
GLOVE SURG SS PI 8.0 STRL IVOR (GLOVE) ×1 IMPLANT
GOWN SPEC L3 XXLG W/TWL (GOWN DISPOSABLE) ×4 IMPLANT
GOWN STRL REUS W/TWL 2XL LVL3 (GOWN DISPOSABLE) ×2 IMPLANT
GOWN STRL REUS W/TWL LRG LVL3 (GOWN DISPOSABLE) ×4 IMPLANT
GOWN STRL REUS W/TWL XL LVL3 (GOWN DISPOSABLE) ×1 IMPLANT
IMMOBILIZER KNEE 20 (SOFTGOODS) ×2
IMMOBILIZER KNEE 20 THIGH 36 (SOFTGOODS) ×1 IMPLANT
K-WIRE ACE 1.6X6 (WIRE) ×4
KWIRE ACE 1.6X6 (WIRE) ×2 IMPLANT
MANIFOLD NEPTUNE II (INSTRUMENTS) ×2 IMPLANT
PACK TOTAL JOINT (CUSTOM PROCEDURE TRAY) ×2 IMPLANT
PAD CAST 4YDX4 CTTN HI CHSV (CAST SUPPLIES) ×1 IMPLANT
PADDING CAST COTTON 4X4 STRL (CAST SUPPLIES) ×2
PADDING CAST COTTON 6X4 STRL (CAST SUPPLIES) ×1 IMPLANT
PASSER SUT SWANSON 36MM LOOP (INSTRUMENTS) ×2 IMPLANT
POSITIONER SURGICAL ARM (MISCELLANEOUS) ×2 IMPLANT
PREVENA INCISION MGT 90 150 (MISCELLANEOUS) ×1 IMPLANT
RETRIEVER SUT HEWSON (MISCELLANEOUS) ×2 IMPLANT
SCREW ACE CAN 4.0 38M (Screw) ×1 IMPLANT
SCREW ACE CAN 4.0 40M (Screw) ×1 IMPLANT
SPONGE LAP 18X18 X RAY DECT (DISPOSABLE) ×2 IMPLANT
SPONGE LAP 4X18 X RAY DECT (DISPOSABLE) ×2 IMPLANT
STAPLER VISISTAT 35W (STAPLE) ×2 IMPLANT
SUT ETHILON 2 0 PSLX (SUTURE) ×2 IMPLANT
SUT FIBERWIRE #2 38 T-5 BLUE (SUTURE) ×6
SUT MERSILENE FIBER S 5 MO-4 1 (SUTURE) ×4 IMPLANT
SUT VIC AB 1 CT1 27 (SUTURE) ×6
SUT VIC AB 1 CT1 27XBRD ANTBC (SUTURE) ×2 IMPLANT
SUT VIC AB 2-0 CT1 27 (SUTURE) ×4
SUT VIC AB 2-0 CT1 TAPERPNT 27 (SUTURE) ×2 IMPLANT
SUTURE FIBERWR #2 38 T-5 BLUE (SUTURE) ×2 IMPLANT
TOWEL OR 17X26 10 PK STRL BLUE (TOWEL DISPOSABLE) ×4 IMPLANT
WRAP KNEE MAXI GEL POST OP (GAUZE/BANDAGES/DRESSINGS) ×2 IMPLANT

## 2016-08-29 NOTE — Anesthesia Procedure Notes (Signed)
Procedure Name: Intubation Date/Time: 08/29/2016 3:45 PM Performed by: Vanessa DurhamOCHRAN, Takeia Ciaravino GLENN Pre-anesthesia Checklist: Patient identified, Emergency Drugs available, Suction available and Patient being monitored Patient Re-evaluated:Patient Re-evaluated prior to inductionOxygen Delivery Method: Circle system utilized Preoxygenation: Pre-oxygenation with 100% oxygen Intubation Type: IV induction Ventilation: Mask ventilation without difficulty Laryngoscope Size: 2 and Miller Grade View: Grade I Tube type: Oral Tube size: 7.0 mm Number of attempts: 1 Airway Equipment and Method: Stylet Placement Confirmation: ETT inserted through vocal cords under direct vision,  positive ETCO2 and breath sounds checked- equal and bilateral Secured at: 22 cm Tube secured with: Tape Dental Injury: Teeth and Oropharynx as per pre-operative assessment

## 2016-08-29 NOTE — Anesthesia Procedure Notes (Signed)
Anesthesia Regional Block: Femoral nerve block   Pre-Anesthetic Checklist: ,, timeout performed, Correct Patient, Correct Site, Correct Laterality, Correct Procedure, Correct Position, site marked, Risks and benefits discussed,  Surgical consent,  Pre-op evaluation,  At surgeon's request and post-op pain management  Laterality: Right  Prep: chloraprep       Needles:  Injection technique: Single-shot  Needle Type: Echogenic Needle     Needle Length: 9cm      Additional Needles:   Procedures: ultrasound guided,,,,,,,,  Narrative:  Start time: 08/29/2016 3:13 PM End time: 08/29/2016 3:23 PM Injection made incrementally with aspirations every 5 mL.  Performed by: Personally  Anesthesiologist: Alise Calais  Additional Notes: Patient tolerated the procedure well without complications

## 2016-08-29 NOTE — Brief Op Note (Signed)
08/29/2016  5:28 PM  PATIENT:  [name] SatoHelen J Simpson  66 y.o. female  PRE-OPERATIVE DIAGNOSIS:  Right patella fracture   POST-OPERATIVE DIAGNOSIS:  Right patella fracture  PROCEDURE:  Procedure(s) with comments: OPEN REDUCTION INTERNAL (ORIF) FIXATION RIGHT PATELLA (Right) - Adductor Block  SURGEON:  Surgeon(s) and Role:    * Sharley Keeler, Arlys JohnBrian, MD - Primary    * Yolonda Kidaogers, Jason Patrick, MD - Assisting  PHYSICIAN ASSISTANT: none  ASSISTANTS: jason rogers, md   ANESTHESIA:   regional and general  EBL:  Total I/O In: 1000 [I.V.:1000] Out: 200 [Blood:200]  BLOOD ADMINISTERED:none  DRAINS: prevena vac   LOCAL MEDICATIONS USED:  NONE  SPECIMEN:  No Specimen  DISPOSITION OF SPECIMEN:  N/A  COUNTS:  YES  TOURNIQUET:   Total Tourniquet Time Documented: Thigh (Right) - 54 minutes Total: Thigh (Right) - 54 minutes   DICTATION: .Other Dictation: Dictation Number [id number]  PLAN OF CARE: Admit for overnight observation  PATIENT DISPOSITION:  PACU - hemodynamically stable.   Delay start of Pharmacological VTE agent (>24hrs) due to surgical blood loss or risk of bleeding: no

## 2016-08-29 NOTE — Transfer of Care (Signed)
Immediate Anesthesia Transfer of Care Note  Patient: [name] SatoHelen J Simpson  Procedure(s) Performed: Procedure(s) with comments: OPEN REDUCTION INTERNAL (ORIF) FIXATION RIGHT PATELLA (Right) - Adductor Block  Patient Location: PACU  Anesthesia Type:General  Level of Consciousness:  sedated, patient cooperative and responds to stimulation  Airway & Oxygen Therapy:Patient Spontanous Breathing and Patient connected to face mask oxgen  Post-op Assessment:  Report given to PACU RN and Post -op Vital signs reviewed and stable  Post vital signs:  Reviewed and stable  Last Vitals:  Vitals:   08/29/16 1524 08/29/16 1529  BP: (!) 181/69 (!) 174/58  Pulse: 100   Resp: 16 (!) 23  Temp:      Complications: No apparent anesthesia complications

## 2016-08-29 NOTE — Anesthesia Preprocedure Evaluation (Signed)
Anesthesia Evaluation  Patient identified by MRN, date of birth, ID band Patient awake    Reviewed: Allergy & Precautions, NPO status , Patient's Chart, lab work & pertinent test results  Airway Mallampati: II  TM Distance: <3 FB Neck ROM: Full    Dental no notable dental hx.    Pulmonary neg pulmonary ROS, sleep apnea and Continuous Positive Airway Pressure Ventilation ,    Pulmonary exam normal breath sounds clear to auscultation       Cardiovascular hypertension, + CAD  Normal cardiovascular exam Rhythm:Regular Rate:Normal     Neuro/Psych negative neurological ROS  negative psych ROS   GI/Hepatic negative GI ROS, Neg liver ROS,   Endo/Other  diabetesMorbid obesity  Renal/GU negative Renal ROS  negative genitourinary   Musculoskeletal negative musculoskeletal ROS (+)   Abdominal   Peds negative pediatric ROS (+)  Hematology negative hematology ROS (+)   Anesthesia Other Findings   Reproductive/Obstetrics negative OB ROS                             Anesthesia Physical Anesthesia Plan  ASA: III  Anesthesia Plan: General   Post-op Pain Management:  Regional for Post-op pain   Induction: Intravenous  PONV Risk Score and Plan: 2 and Ondansetron, Dexamethasone and Treatment may vary due to age  Airway Management Planned: Oral ETT  Additional Equipment:   Intra-op Plan:   Post-operative Plan: Extubation in OR  Informed Consent: I have reviewed the patients History and Physical, chart, labs and discussed the procedure including the risks, benefits and alternatives for the proposed anesthesia with the patient or authorized representative who has indicated his/her understanding and acceptance.   Dental advisory given  Plan Discussed with: CRNA and Surgeon  Anesthesia Plan Comments:         Anesthesia Quick Evaluation

## 2016-08-29 NOTE — H&P (Signed)
PREOPERATIVE H&P  Chief Complaint: Right patella fracture  HPI: [name] is a 66 y.o. female who presents for preoperative history and physical with a diagnosis of Right patella fracture. She has elected for surgical management.   Past Medical History:  Diagnosis Date  . Anemia   . Anginal pain (HCC)    pt relates to stress pt was seen by Dr Rennis Golden 3 wks ago was given metoprolol   . Bronchitis    hx of   . CAD (coronary artery disease)    PCI to LAD in 2010, mild residual disease in LCX and RCA  . Cataracts, bilateral   . Diabetes mellitus without complication (HCC)   . Family history of adverse reaction to anesthesia    pts mother had difficulty awakening   . GERD (gastroesophageal reflux disease)   . Hyperlipidemia LDL goal < 70   . Hypertension   . Lumbar back pain   . Numbness    left leg and foot   . Plantar fascia rupture    Left Foot  . Sleep apnea    uses CPAP    Past Surgical History:  Procedure Laterality Date  . ABDOMINAL HYSTERECTOMY    . BREAST CYST ASPIRATION  1995  . CAROTID STENT  2009   pt denies   . CORONARY ANGIOPLASTY WITH STENT PLACEMENT  2010and 10-02-2012   Stent DES, Xience to prox. LAD  . DOPPLER ECHOCARDIOGRAPHY  08/01/2009   EF=>55%,LV normal  . LEFT HEART CATHETERIZATION WITH CORONARY ANGIOGRAM N/A 10/02/2012   Procedure: LEFT HEART CATHETERIZATION WITH CORONARY ANGIOGRAM;  Surgeon: Runell Gess, MD;  Location: [facility] CATH LAB;  Service: Cardiovascular;  Laterality: N/A;  . lower arterial duplex  06/20/10   abi's normal,rgt 0.98,lft 1.06;bilateral PVRs normal  . LUMBAR LAMINECTOMY/DECOMPRESSION MICRODISCECTOMY Left 01/06/2013   Procedure: MICRO LUMBAR DECOMPRESSION L4-5 AND L5-S1;  Surgeon: Javier Docker, MD;  Location: [facility] ORS;  Service: Orthopedics;  Laterality: Left;  . LUMBAR LAMINECTOMY/DECOMPRESSION MICRODISCECTOMY Left 10/12/2014   Procedure: REVISION MICRO LUMBAR/DECOMPRESSION L4-5 LEFT ;  Surgeon: Jene Every, MD;  Location: [facility]  ORS;  Service: Orthopedics;  Laterality: Left;  . NM MYOCAR PERF WALL MOTION  09/22/2008   lexiscan-EF 83%; glogal LV systolic fx is norm. ,evidence of mild ischemia basal anterior,midanterior and apical lateral region(s).   . TUBAL LIGATION    . TYMPANOPLASTY Bilateral   . UVULOPALATOPHARYNGOPLASTY     pt denies    Social History   Social History  . Marital status: Married    Spouse name: [name]  . Number of children: 2  . Years of education: MSN   Occupational History  . [employer]   Social History Main Topics  . Smoking status: Never Smoker  . Smokeless tobacco: Never Used  . Alcohol use Yes     Comment: occasional, 1 a month wine  . Drug use: No  . Sexual activity: Yes   Other Topics Concern  . None   Social History Narrative      Married. Education: [employer]. Exercise: Yes   Patient lives at home with her spouse.   Caffeine use: 1 drink of tea a day   Family History  Problem Relation Age of Onset  . Coronary artery disease Mother   . Rheum arthritis Mother   . Dementia Mother   . Heart attack Father   . Hypertension Father   . Hyperlipidemia Father   . Other Father        MVA  .  Hypertension Brother   . Cancer Brother   . Heart disease Brother   . Cancer Paternal Grandmother        stomach  . Diabetes Paternal Grandfather   . Breast cancer Neg Hx    Allergies  Allergen Reactions  . Niacin And Related Other (See Comments)    Whelps and skin flushed, mouth tingling  . Tolectin [Tolmetin] Rash   Prior to Admission medications   Medication Sig Start Date End Date Taking? Authorizing Provider  aspirin EC 81 MG tablet Take 1 tablet (81 mg total) by mouth daily. Resume 5 days post-op 10/13/14  Yes Andrez Grime M, PA-C  calcium carbonate (OS-CAL) 1250 (500 Ca) MG chewable tablet Chew 1 tablet by mouth daily.   Yes [provider]  cholecalciferol (VITAMIN D) 1000 UNITS tablet Take 1,000 Units by mouth daily.   Yes [provider]  clopidogrel (PLAVIX) 75 MG tablet TAKE 1 TABLET BY MOUTH DAILY. 08/14/16  Yes Hilty, Lisette Abu, MD  cyclobenzaprine (FLEXERIL) 10 MG tablet Take 1 tablet (10 mg total) by mouth 2 (two) times daily as needed for muscle spasms. 08/16/16  Yes Copland, [name], MD  diazepam (VALIUM) 2 MG tablet TAKE 1 TABLET BY MOUTH EVERY 8 HOURS AS NEEDED FOR MUSCLE SPASMS 07/18/16  Yes Copland, [name], MD  docusate sodium (COLACE) 100 MG capsule Take 1 capsule (100 mg total) by mouth 2 (two) times daily as needed for mild constipation. 10/12/14  Yes Jene Every, MD  famotidine (PEPCID) 20 MG tablet TAKE 1 TABLET BY MOUTH 2 TIMES DAILY. 07/10/16  Yes Hilty, Lisette Abu, MD  HYDROcodone-acetaminophen (NORCO/VICODIN) 5-325 MG tablet Take 1-2 tablets by mouth every 6 (six) hours as needed. 08/16/16  Yes Copland, [name], MD  metFORMIN (GLUCOPHAGE-XR) 500 MG 24 hr tablet Take 1 tablet (500 mg total) by mouth daily with breakfast. 07/18/16  Yes Copland, [name], MD  metoprolol succinate (TOPROL-XL) 25 MG 24 hr tablet TAKE 1/2 TABLET BY MOUTH DAILY 06/03/16  Yes Hilty, Lisette Abu, MD  simvastatin (ZOCOR) 40 MG tablet Take 1 tablet (40 mg total) by mouth every evening. 07/18/16  Yes Copland, [name], MD  telmisartan (MICARDIS) 40 MG tablet TAKE 1 TABLET BY MOUTH EVERY MORNING 06/28/16  Yes Hilty, Lisette Abu, MD  cetirizine (ZYRTEC) 10 MG tablet Take 1 tablet (10 mg total) by mouth daily. Patient taking differently: Take 10 mg by mouth daily as needed for allergies.  04/28/14   Ofilia Neas, PA-C  fluticasone (FLONASE) 50 MCG/ACT nasal spray Place 2 sprays into both nostrils daily. Take two sprays in each nostril daily. Patient taking differently: Place 2 sprays into both nostrils daily as needed for allergies. Take two sprays in each nostril daily. 04/28/14   Ofilia Neas, PA-C  predniSONE (DELTASONE) 20 MG tablet Take 2 pills a day for 4 days, then 1 pill a day for 4 days Patient not taking: Reported on  08/27/2016 06/05/16   Copland, [name], MD     Positive ROS: All other systems have been reviewed and were otherwise negative with the exception of those mentioned in the HPI and as above.  Physical Exam: General: Alert, no acute distress Cardiovascular: No pedal edema Respiratory: No cyanosis, no use of accessory musculature GI: No organomegaly, abdomen is soft and non-tender Skin: No lesions in the area of chief complaint Neurologic: Sensation intact distally Psychiatric: Patient is competent for consent with normal mood and affect Lymphatic: No axillary or cervical  lymphadenopathy  MUSCULOSKELETAL: Examination of the right knee reveals that her skin has improved. She does have some bruising to the medial aspect of the knee. She has a small superficial abrasion over the superior pole of the patella. She is unable to perform a straight leg raise. There is no significant pedal edema. No calf tenderness to palpation. She is neurovascularly intact.  Assessment: Right patella fracture  Plan: Plan for Procedure(s): OPEN REDUCTION INTERNAL (ORIF) FIXATION RIGHT PATELLA  The risks benefits and alternatives were discussed with the patient including but not limited to the risks of nonoperative treatment, versus surgical intervention including infection, malunion, nonunion, posttraumatic arthritis, stiffness, bleeding, nerve injury, blood clots, cardiopulmonary complications, morbidity, mortality, among others, and they were willing to proceed.   Maleiah Dula, Cloyde ReamsBrian James, MD Cell [phone]   08/29/2016 3:07 PM

## 2016-08-29 NOTE — Progress Notes (Signed)
Assisted Dr. Rose with right, ultrasound guided, femoral block. Side rails up, monitors on throughout procedure. See vital signs in flow sheet. Tolerated Procedure well. 

## 2016-08-29 NOTE — Anesthesia Procedure Notes (Signed)
Anesthesia Procedure Image    

## 2016-08-29 NOTE — Discharge Instructions (Signed)
°Dr. Clarence Cogswell °Adult Hip & Knee Specialist °[facility] °[address] °[place], [place] [address] °[phone] ° ° °POSTOPERATIVE DIRECTIONS ° ° ° °Rehabilitation, Guidelines Following Surgery  ° °WEIGHT BEARING °Weight bearing as tolerated with assist device (walker, cane, etc) as directed, use it as long as suggested by your surgeon or therapist, typically at least 4-6 weeks. °Wear Knee immobilizer at all times. Do not bend your knee. ° °HOME CARE INSTRUCTIONS  °Remove items at home which could result in a fall. This includes throw rugs or furniture in walking pathways.  °Continue medications as instructed at time of discharge. °· You may have some home medications which will be placed on hold until you complete the course of blood thinner medication. °Do not put on socks or shoes without following the instructions of your caregivers.   °Sit on chairs with arms. Use the chair arms to help push yourself up when arising.  °Arrange for the use of a toilet seat elevator so you are not sitting low.  °· Walk with walker as instructed.  °You may resume a sexual relationship in 6 weeks or when given the OK by your caregiver.  °Use walker as long as suggested by your caregivers.  °Avoid periods of inactivity such as sitting longer than an hour when not asleep. This helps prevent blood clots.  °You may return to work once you are cleared by your surgeon.  °Do not drive a car for 6 weeks or until released by your surgeon.  °Do not drive while taking narcotics.  °Wear elastic stockings for two weeks following surgery during the day but you may remove then at night.  °Make sure you keep all of your appointments after your operation with all of your doctors and caregivers. You should call the office at the above phone number and make an appointment for approximately two weeks after the date of your surgery. °Please pick up a stool softener and laxative for home use as long as you are requiring  pain medications. °· ICE to the affected knee every three hours for 30 minutes at a time and then as needed for pain and swelling. Continue to use ice on the hip for pain and swelling from surgery. You may notice swelling that will progress down to the foot and ankle.  This is normal after surgery.  Elevate the leg when you are not up walking on it.   °It is important for you to complete the blood thinner medication as prescribed by your doctor. °· Continue to use the breathing machine which will help keep your temperature down.  It is common for your temperature to cycle up and down following surgery, especially at night when you are not up moving around and exerting yourself.  The breathing machine keeps your lungs expanded and your temperature down. ° °RANGE OF MOTION AND STRENGTHENING EXERCISES  °These exercises are designed to help you keep full movement of your hip joint. Follow your caregiver's or physical therapist's instructions. Perform all exercises about fifteen times, three times per day or as directed. Exercise both hips, even if you have had only one joint replacement. These exercises can be done on a training (exercise) mat, on the floor, on a table or on a bed. Use whatever works the best and is most comfortable for you. Use music or television while you are exercising so that the exercises are a pleasant break in your day. This will make your life better with the exercises   acting as a break in routine you can look forward to.  °Lying on your back, slowly slide your foot toward your buttocks, raising your knee up off the floor. Then slowly slide your foot back down until your leg is straight again.  °Lying on your back spread your legs as far apart as you can without causing discomfort.  °Lying on your side, raise your upper leg and foot straight up from the floor as far as is comfortable. Slowly lower the leg and repeat.  °Lying on your back, tighten up the muscle in the front of your thigh  (quadriceps muscles). You can do this by keeping your leg straight and trying to raise your heel off the floor. This helps strengthen the largest muscle supporting your knee.  °Lying on your back, tighten up the muscles of your buttocks both with the legs straight and with the knee bent at a comfortable angle while keeping your heel on the floor.  ° °SKILLED REHAB INSTRUCTIONS: °If the patient is transferred to a skilled rehab facility following release from the hospital, a list of the current medications will be sent to the facility for the patient to continue.  When discharged from the skilled rehab facility, please have the facility set up the patient's Home Health Physical Therapy prior to being released. Also, the skilled facility will be responsible for providing the patient with their medications at time of release from the facility to include their pain medication and their blood thinner medication. If the patient is still at the rehab facility at time of the two week follow up appointment, the skilled rehab facility will also need to assist the patient in arranging follow up appointment in our office and any transportation needs. ° °MAKE SURE YOU:  °Understand these instructions.  °Will watch your condition.  °Will get help right away if you are not doing well or get worse. ° °Pick up stool softner and laxative for home use following surgery while on pain medications. °Keep dressing clean and dry. Do NOT remove. °Continue to use ice for pain and swelling after surgery. °Do not use any lotions or creams on the incision until instructed by your surgeon. ° ° °

## 2016-08-29 NOTE — Anesthesia Postprocedure Evaluation (Signed)
Anesthesia Post Note  Patient: [name]  Procedure(s) Performed: Procedure(s) (LRB): OPEN REDUCTION INTERNAL (ORIF) FIXATION RIGHT PATELLA (Right)     Patient location during evaluation: PACU Anesthesia Type: General Level of consciousness: awake and alert Pain management: pain level controlled Vital Signs Assessment: post-procedure vital signs reviewed and stable Respiratory status: spontaneous breathing, nonlabored ventilation, respiratory function stable and patient connected to nasal cannula oxygen Cardiovascular status: blood pressure returned to baseline and stable Postop Assessment: no signs of nausea or vomiting Anesthetic complications: no    Last Vitals:  Vitals:   08/29/16 1733 08/29/16 1745  BP: (!) 145/57 (!) 135/56  Pulse: 84 88  Resp: 18 18  Temp: 36.7 C     Last Pain:  Vitals:   08/29/16 1745  TempSrc:   PainSc: 0-No pain                 Michel Hendon S

## 2016-08-30 ENCOUNTER — Encounter (HOSPITAL_COMMUNITY): Payer: Self-pay | Admitting: Orthopedic Surgery

## 2016-08-30 DIAGNOSIS — S82041A Displaced comminuted fracture of right patella, initial encounter for closed fracture: Secondary | ICD-10-CM | POA: Diagnosis not present

## 2016-08-30 LAB — BASIC METABOLIC PANEL WITH GFR
Anion gap: 8 (ref 5–15)
BUN: 19 mg/dL (ref 6–20)
CO2: 25 mmol/L (ref 22–32)
Calcium: 9.1 mg/dL (ref 8.9–10.3)
Chloride: 104 mmol/L (ref 101–111)
Creatinine, Ser: 0.92 mg/dL (ref 0.44–1.00)
GFR calc Af Amer: >60 mL/min
GFR calc non Af Amer: >60 mL/min
Glucose, Bld: 200 mg/dL — ABNORMAL HIGH (ref 65–99)
Potassium: 4.3 mmol/L (ref 3.5–5.1)
Sodium: 137 mmol/L (ref 135–145)

## 2016-08-30 MED ORDER — DIAZEPAM 2 MG PO TABS: ORAL_TABLET | ORAL | 0 refills | Status: DC

## 2016-08-30 MED ORDER — SENNA 8.6 MG PO TABS: 1.0000 | ORAL_TABLET | Freq: Two times a day (BID) | ORAL | 0 refills | Status: DC

## 2016-08-30 MED ORDER — ASPIRIN 81 MG PO TBEC: 81.0000 mg | DELAYED_RELEASE_TABLET | Freq: Every day | ORAL | 1 refills | Status: AC

## 2016-08-30 MED ORDER — HYDROCODONE-ACETAMINOPHEN 5-325 MG PO TABS: 1.0000 | ORAL_TABLET | ORAL | 0 refills | Status: DC | PRN

## 2016-08-30 MED ORDER — ONDANSETRON HCL 4 MG PO TABS: 4.0000 mg | ORAL_TABLET | Freq: Four times a day (QID) | ORAL | 0 refills | Status: DC | PRN

## 2016-08-30 MED FILL — HYDROCODON-APAP 5-325: 5-325 | 5 days supply | Qty: 60 | Fill #0

## 2016-08-30 MED FILL — ONDANSETRON HCL 4 MG TABLET: 4 | 5 days supply | Qty: 20 | Fill #0

## 2016-08-30 MED FILL — diazePAM 2 MG TABS: 2 | 10 days supply | Qty: 30 | Fill #0

## 2016-08-30 NOTE — Evaluation (Signed)
Physical Therapy Evaluation Patient Details Name: [name] MRN: [id number] DOB: December 15, 1950 Today's Date: 08/30/2016   History of Present Illness  Pt s/p R patellar fx ~2 weeks ago and with ORIF 08/29/16.  Pt with hx of CAD and lumbar lami in 2014 and 2016  Clinical Impression  Pt admitted as above and presenting with functional mobility limitations 2* decreased R LE strength/ROM and post op pain.  Pt should progress to dc home with family assist.    Follow Up Recommendations Home health PT    Equipment Recommendations  None recommended by PT    Recommendations for Other Services       Precautions / Restrictions Precautions Precautions: Fall;Other (comment) Precaution Comments: No Knee flex on R Required Braces or Orthoses: Knee Immobilizer - Right Knee Immobilizer - Right: On at all times Restrictions Weight Bearing Restrictions: No Other Position/Activity Restrictions: WBAT (with KI in place)      Mobility  Bed Mobility Overal bed mobility: Needs Assistance Bed Mobility: Supine to Sit     Supine to sit: Min assist     General bed mobility comments: cues for sequence and min assist with R LE  Transfers Overall transfer level: Needs assistance Equipment used: Rolling walker (2 wheeled) Transfers: Sit to/from Stand Sit to Stand: Min assist;Min guard         General transfer comment: cues for LE management and use of UEs to self assist  Ambulation/Gait Ambulation/Gait assistance: Min assist;Min guard Ambulation Distance (Feet): 78 Feet Assistive device: Rolling walker (2 wheeled) Gait Pattern/deviations: Step-to pattern;Decreased step length - right;Decreased step length - left;Shuffle;Trunk flexed Gait velocity: decr Gait velocity interpretation: Below normal speed for age/gender General Gait Details: cues for sequence, posture and position from [employer]  Stairs            Wheelchair Mobility    Modified Rankin (Stroke Patients Only)       Balance                                              Pertinent Vitals/Pain Pain Assessment: 0-10 Pain Score: 3  Pain Location: R knee Pain Descriptors / Indicators: Sore Pain Intervention(s): Premedicated before session;Monitored during session;Limited activity within patient's tolerance    Home Living Family/patient expects to be discharged to:: Private residence Living Arrangements: Spouse/significant other Available Help at Discharge: Family Type of Home: House Home Access: Stairs to enter Entrance Stairs-Rails: Doctor, general practiceight;Left Entrance Stairs-Number of Steps: 3 Home Layout: One level Home Equipment: Environmental consultantWalker - 2 wheels;Bedside commode;Wheelchair - manual;Cane - single point      Prior Function Level of Independence: Needs assistance   Gait / Transfers Assistance Needed: Pt has been using RW since patellar fx and spouse has been using wc up/down stairs   ADL's / Homemaking Assistance Needed: assist of spouse x several weeks        Hand Dominance        Extremity/Trunk Assessment   Upper Extremity Assessment Upper Extremity Assessment: Overall WFL for tasks assessed    Lower Extremity Assessment Lower Extremity Assessment: RLE deficits/detail RLE Deficits / Details: KI in place - NO ROM at KNEE    Cervical / Trunk Assessment Cervical / Trunk Assessment: Normal  Communication   Communication: No difficulties  Cognition Arousal/Alertness: Awake/alert Behavior During Therapy: WFL for tasks assessed/performed Overall Cognitive Status: Within Functional Limits for tasks assessed  General Comments      Exercises General Exercises - Lower Extremity Ankle Circles/Pumps: AROM;Both;15 reps;Supine   Assessment/Plan    PT Assessment Patient needs continued PT services  PT Problem List Decreased strength;Decreased range of motion;Decreased activity tolerance;Decreased balance;Decreased mobility;Decreased  knowledge of use of DME;Pain;Obesity       PT Treatment Interventions DME instruction;Gait training;Stair training;Functional mobility training;Therapeutic activities;Therapeutic exercise;Balance training;Patient/family education    PT Goals (Current goals can be found in the Care Plan section)  Acute Rehab PT Goals Patient Stated Goal: Regain IND PT Goal Formulation: With patient Time For Goal Achievement: 08/31/16 Potential to Achieve Goals: Good    Frequency Min 6X/week   Barriers to discharge        Co-evaluation               AM-PAC PT "6 Clicks" Daily Activity  Outcome Measure Difficulty turning over in bed (including adjusting bedclothes, sheets and blankets)?: A Little Difficulty moving from lying on back to sitting on the side of the bed? : A Little Difficulty sitting down on and standing up from a chair with arms (e.g., wheelchair, bedside commode, etc,.)?: A Little Help needed moving to and from a bed to chair (including a wheelchair)?: A Little Help needed walking in hospital room?: A Little Help needed climbing 3-5 steps with a railing? : A Little 6 Click Score: 18    End of Session Equipment Utilized During Treatment: Gait belt;Right knee immobilizer Activity Tolerance: Patient tolerated treatment well Patient left: in chair;with call bell/phone within reach;with family/visitor present Nurse Communication: Mobility status PT Visit Diagnosis: History of falling (Z91.81);Difficulty in walking, not elsewhere classified (R26.2)    Time: 1610-9604 PT Time Calculation (min) (ACUTE ONLY): 22 min   Charges:   PT Evaluation $PT Eval Low Complexity: 1 Procedure     PT G Codes:        Pg [phone]   Ricka Westra 08/30/2016, 3:10 PM

## 2016-08-30 NOTE — Progress Notes (Signed)
RN reviewed discharge instructions with patient and family. All questions answered.   Paperwork and prescriptions given.   NT rolled patient down with all belongings to family car. 

## 2016-08-30 NOTE — Op Note (Signed)
NAME:  Lanora ManisMABE, Rafael                       ACCOUNT NO.:  MEDICAL RECORD NO.:  [id number]  LOCATION:                                 FACILITY:  PHYSICIAN:  Samson FredericBrian Donaldson Richter, MD     DATE OF BIRTH:  1950-12-14  DATE OF PROCEDURE:  08/29/2016 DATE OF DISCHARGE:                              OPERATIVE REPORT   SURGEON:  Samson FredericBrian Jru Pense, MD.  ASSISTANT:  Duwayne HeckJason Rogers, MD.  PREOPERATIVE DIAGNOSIS:  Comminuted right patella fracture.  POSTOPERATIVE DIAGNOSIS:  Comminuted right patella fracture.  PROCEDURE PERFORMED:  Open reduction and internal fixation of right patella fracture.  IMPLANTS:  Biomet Ace 4.0-mm cannulated screw x2.  ANESTHESIA:  Adductor canal block plus general.  ESTIMATED BLOOD LOSS:  200 mL.  COMPLICATIONS:  None.  TUBES AND DRAINS:  Prevena wound VAC at 75 mmHg.  DISPOSITION:  Stable to PACU.  INDICATIONS:  The patient is a 66 year old female, fell directly on her knee, sustained a right patella fracture.  She was sent to me by Dr. Shelle IronBeane for definitive management.  She was indicated for operative fixation.  We discussed the risks, benefits, alternatives, she elected to proceed.  DESCRIPTION OF PROCEDURE IN DETAIL:  I identified the patient in the holding area.  Surgical site was marked by myself.  Adductor canal block was placed by Anesthesia.  She was then taken to the operating room, placed supine on the operative table.  General anesthesia was induced. Nonsterile tourniquet was applied to the right groin.  Right lower extremity was prepped and draped in normal sterile surgical fashion. Time-out was called verifying site and side of surgery.  I began by using Esmarch to exsanguinate the lower extremity.  The tourniquet was elevated at 320 mmHg.  She did have a small superficial abrasion at the superior aspect of the patella.  I stayed medial to this area with the skin incision.  I made a longitudinal skin incision just medial to this abrasion,  full-thickness skin flaps were created.  I identified the fracture.  She had small medial and lateral retinacular tears.  The fracture was displaced.  I cleaned the fracture ends with a curette and a rongeur.  She did have some comminution distally.  The joint was irrigated, I brought the knee into full extension, and then I reduced the fracture with a point-to-point clamp.  I used AP and lateral fluoroscopy views to confirm reduction of the fracture.  I then placed 2 K-wires in a retrograde fashion parallel to each other across the fracture site.  I checked the pin position on AP and lateral fluoroscopy views.  I then sequentially measured, drilled the near cortex, and placed 2 cannulated lag screws.  The guidepins were removed.  I used a [place] suture passer to place a #5 FiberWire.  Once the suture was passed in a figure-of-eight fashion, I then tightened the suture down and the ends were clipped.  At this point, she did have some oozing from the skin edges, was a venous tourniquet.  We then let the tourniquet down, and we achieved hemostasis with electrocautery.  I then repaired the retinacular tears medially and laterally  with #1 Vicryl interrupted sutures.  I then tested the repair.  I flexed her down to about 80 degrees, there was no gapping or separation.  Final fluoroscopy views were obtained, AP and lateral views.  The wound was then irrigated with saline.  I closed the wound with 2-0 Vicryl for the deep dermal layer and 2-0 nylon vertical mattress sutures.  She did have some edema in the skin flaps, so I elected to place a Prevena wound VAC according to manufacture's instructions.  The VAC was turned on, got excellent seal. Sterile dressing was applied with sterile cast padding, Ace wrap, knee immobilizer was placed.  The patient was extubated, taken to PACU in stable condition.  Sponge, needle, and instrument counts were correct at the end of the case x2.  There were no  complications.  Postoperatively, we will admit her for overnight observation.  She will need to keep the knee in full extension and the knee immobilizer.  She may weightbear as tolerated in the knee immobilizer.  We will convert her to the portable VAC unit upon discharge, and I will need to see her in the office in 7 days for Prevena VAC removal.  We will put her on aspirin for DVT prophylaxis.          ______________________________ Samson Frederic, MD     BS/MEDQ  D:  08/29/2016  T:  08/29/2016  Job:  [id number]

## 2016-08-30 NOTE — Discharge Summary (Signed)
Physician Discharge Summary  Patient ID: [name] MRN: [id number] DOB/AGE: 10/06/1950 66 y.o.  Admit date: 08/29/2016 Discharge date: 08/30/2016  Admission Diagnoses:  Right patella fracture  Discharge Diagnoses:  Principal Problem:   Right patella fracture   Past Medical History:  Diagnosis Date  . Anemia   . Anginal pain (HCC)    pt relates to stress pt was seen by Dr Rennis Golden 3 wks ago was given metoprolol   . Bronchitis    hx of   . CAD (coronary artery disease)    PCI to LAD in 2010, mild residual disease in LCX and RCA  . Cataracts, bilateral   . Diabetes mellitus without complication (HCC)   . Family history of adverse reaction to anesthesia    pts mother had difficulty awakening   . GERD (gastroesophageal reflux disease)   . Hyperlipidemia LDL goal < 70   . Hypertension   . Lumbar back pain   . Numbness    left leg and foot   . Plantar fascia rupture    Left Foot  . Sleep apnea    uses CPAP     Surgeries: Procedure(s): OPEN REDUCTION INTERNAL (ORIF) FIXATION RIGHT PATELLA on 08/29/2016   Consultants (if any):   Discharged Condition: Improved  Hospital Course: [name] is an 66 y.o. female who was admitted 08/29/2016 with a diagnosis of Right patella fracture and went to the operating room on 08/29/2016 and underwent the above named procedures.    She was given perioperative antibiotics:  Anti-infectives    Start     Dose/Rate Route Frequency Ordered Stop   08/29/16 2200  ceFAZolin (ANCEF) IVPB 2g/100 mL premix     2 g 200 mL/hr over 30 Minutes Intravenous Every 6 hours 08/29/16 1927 08/30/16 1559   08/29/16 1459  ceFAZolin (ANCEF) 2-4 GM/100ML-% IVPB    Comments:  Almon Register   : cabinet override      08/29/16 1459 08/29/16 1537   08/29/16 1400  ceFAZolin (ANCEF) IVPB 2g/100 mL premix     2 g 200 mL/hr over 30 Minutes Intravenous On call to O.R. 08/29/16 1358 08/29/16 1537    .  She was given sequential compression devices, early ambulation, and  ASA/Plavix for DVT prophylaxis.  She benefited maximally from the hospital stay and there were no complications.    Recent vital signs:  Vitals:   08/30/16 0151 08/30/16 0659  BP: (!) 124/46 (!) 110/42  Pulse: 81 71  Resp:    Temp: 98.4 F (36.9 C) 97.9 F (36.6 C)    Recent laboratory studies:  Lab Results  Component Value Date   HGB 11.9 (L) 08/27/2016   HGB 12.2 08/12/2016   HGB 12.9 07/18/2016   Lab Results  Component Value Date   WBC 12.1 (H) 08/27/2016   PLT 339 08/27/2016   Lab Results  Component Value Date   INR 0.98 10/01/2012   Lab Results  Component Value Date   NA 137 08/30/2016   K 4.3 08/30/2016   CL 104 08/30/2016   CO2 25 08/30/2016   BUN 19 08/30/2016   CREATININE 0.92 08/30/2016   GLUCOSE 200 (H) 08/30/2016    Discharge Medications:   Allergies as of 08/30/2016      Reactions   Niacin And Related Other (See Comments)   Whelps and skin flushed, mouth tingling   Tolectin [tolmetin] Rash      Medication List    STOP taking these medications   cyclobenzaprine 10  MG tablet Commonly known as:  FLEXERIL   predniSONE 20 MG tablet Commonly known as:  DELTASONE     TAKE these medications   aspirin 81 MG EC tablet Take 1 tablet (81 mg total) by mouth daily. What changed:  additional instructions   calcium carbonate 1250 (500 Ca) MG chewable tablet Commonly known as:  OS-CAL Chew 1 tablet by mouth daily.   cetirizine 10 MG tablet Commonly known as:  ZYRTEC Take 1 tablet (10 mg total) by mouth daily. What changed:  when to take this  reasons to take this   cholecalciferol 1000 units tablet Commonly known as:  VITAMIN D Take 1,000 Units by mouth daily.   clopidogrel 75 MG tablet Commonly known as:  PLAVIX TAKE 1 TABLET BY MOUTH DAILY.   diazepam 2 MG tablet Commonly known as:  VALIUM TAKE 1 TABLET BY MOUTH EVERY 8 HOURS AS NEEDED FOR MUSCLE SPASMS   docusate sodium 100 MG capsule Commonly known as:  COLACE Take 1 capsule  (100 mg total) by mouth 2 (two) times daily as needed for mild constipation.   famotidine 20 MG tablet Commonly known as:  PEPCID TAKE 1 TABLET BY MOUTH 2 TIMES DAILY.   fluticasone 50 MCG/ACT nasal spray Commonly known as:  FLONASE Place 2 sprays into both nostrils daily. Take two sprays in each nostril daily. What changed:  when to take this  reasons to take this  additional instructions   HYDROcodone-acetaminophen 5-325 MG tablet Commonly known as:  NORCO/VICODIN Take 1-2 tablets by mouth every 4 (four) hours as needed (breakthrough pain). What changed:  when to take this  reasons to take this   metFORMIN 500 MG 24 hr tablet Commonly known as:  GLUCOPHAGE-XR Take 1 tablet (500 mg total) by mouth daily with breakfast.   metoprolol succinate 25 MG 24 hr tablet Commonly known as:  TOPROL-XL TAKE 1/2 TABLET BY MOUTH DAILY   ondansetron 4 MG tablet Commonly known as:  ZOFRAN Take 1 tablet (4 mg total) by mouth every 6 (six) hours as needed for nausea.   senna 8.6 MG Tabs tablet Commonly known as:  SENOKOT Take 1 tablet (8.6 mg total) by mouth 2 (two) times daily.   simvastatin 40 MG tablet Commonly known as:  ZOCOR Take 1 tablet (40 mg total) by mouth every evening.   telmisartan 40 MG tablet Commonly known as:  MICARDIS TAKE 1 TABLET BY MOUTH EVERY MORNING            Durable Medical Equipment        Start     Ordered   08/29/16 1927  DME 3 n 1  Once     08/29/16 1927   08/29/16 1927  DME Walker rolling  Once    Question:  Patient needs a walker to treat with the following condition  Answer:  Right patella fracture   08/29/16 1927      Diagnostic Studies: Dg Knee 1-2 Views Right  Result Date: 08/29/2016 CLINICAL DATA:  ORIF for right patella fracture. EXAM: RIGHT KNEE - 1-2 VIEW COMPARISON:  07/26/2016 FINDINGS: Two spot fluoro images of the right knee are submitted after the procedure. Two cannulated compression screws are seen crossing a transverse  fracture of the patella. IMPRESSION: Status post ORIF for patellar fracture. Electronically Signed   By: Kennith Center M.D.   On: 08/29/2016 18:04   Dg Knee Right Port  Result Date: 08/29/2016 CLINICAL DATA:  Post pop ORIF patella EXAM: PORTABLE RIGHT KNEE -  1-2 VIEW COMPARISON:  07/26/2016 FINDINGS: Interval 2 screw fixation of the patella across comminuted mid patellar fracture. Popliteal fossa vascular calcification. Normal alignment. Mild medial, lateral, and patellofemoral degenerative changes. Suprapatellar effusion. Soft tissue thickening and small amount of prepatellar soft tissue gas consistent with recent surgery. IMPRESSION: Screw fixation of comminuted mid patellar fracture with expected postsurgical changes. Electronically Signed   By: Jasmine PangKim  Fujinaga M.D.   On: 08/29/2016 18:40   Dg C-arm 1-60 Min-no Report  Result Date: 08/29/2016 Fluoroscopy was utilized by the requesting physician.  No radiographic interpretation.    Disposition: 01-Home or Self Care  Discharge Instructions    Call MD / Call 911    Complete by:  As directed    If you experience chest pain or shortness of breath, CALL 911 and be transported to the hospital emergency room.  If you develope a fever above 101 F, pus (white drainage) or increased drainage or redness at the wound, or calf pain, call your surgeon's office.   Constipation Prevention    Complete by:  As directed    Drink plenty of fluids.  Prune juice may be helpful.  You may use a stool softener, such as Colace (over the counter) 100 mg twice a day.  Use MiraLax (over the counter) for constipation as needed.   Diet - low sodium heart healthy    Complete by:  As directed    Discharge instructions    Complete by:  As directed    Wear knee brace at all times Do not bend your knee Keep wound VAC clean and dry   Driving restrictions    Complete by:  As directed    No driving for 12 weeks   Increase activity slowly as tolerated    Complete by:  As  directed    Lifting restrictions    Complete by:  As directed    No lifting for 12 weeks      Follow-up Information    Hayzen Lorenson, Arlys JohnBrian, MD. Schedule an appointment as soon as possible for a visit on 09/06/2016.   Specialty:  Orthopedic Surgery Why:  For wound re-check Contact information: [address] [place] [address] [phone]        [facility] Follow up.   Why:  physical therapy Contact information: [address] [place] [address] [phone]            Signed: Garnet KoyanagiSwinteck, Patsie Mccardle James 08/30/2016, 11:09 AM

## 2016-08-30 NOTE — Progress Notes (Signed)
   Subjective:  Patient reports pain as mild to moderate.  Denies N/V/CP/SOB.  Objective:   VITALS:   Vitals:   08/29/16 2100 08/29/16 2200 08/30/16 0151 08/30/16 0659  BP: (!) 149/53 (!) 139/47 (!) 124/46 (!) 110/42  Pulse: 87 86 81 71  Resp: 16     Temp: 98.6 F (37 C) 98.8 F (37.1 C) 98.4 F (36.9 C) 97.9 F (36.6 C)  TempSrc: Oral Oral Oral Oral  SpO2: 100% 96% 99% 98%  Weight:      Height:        NAD ABD soft Sensation intact distally Intact pulses distally Dorsiflexion/Plantar flexion intact Incision: dressing C/D/I Compartment soft Prevena intact   Lab Results  Component Value Date   WBC 12.1 (H) 08/27/2016   HGB 11.9 (L) 08/27/2016   HCT 37.3 08/27/2016   MCV 81.4 08/27/2016   PLT 339 08/27/2016   BMET    Component Value Date/Time   NA 137 08/30/2016 0503   K 4.3 08/30/2016 0503   CL 104 08/30/2016 0503   CO2 25 08/30/2016 0503   GLUCOSE 200 (H) 08/30/2016 0503   BUN 19 08/30/2016 0503   CREATININE 0.92 08/30/2016 0503   CREATININE 0.82 09/29/2014 0910   CALCIUM 9.1 08/30/2016 0503   GFRNONAA >60 08/30/2016 0503   GFRAA >60 08/30/2016 0503     Assessment/Plan: 1 Day Post-Op   Active Problems:   Right patella fracture  WBAT with walker Knee immobilizer at all times DVT ppx: ASA + plavix, SCDs, TEDs PT/OT Dispo: D/C home with HHPT, convert to portable VAC upon d/c   Aeon Kessner, Cloyde ReamsBrian James 08/30/2016, 11:05 AM   Samson FredericBrian Atharv Barriere, MD Cell [phone]

## 2016-08-30 NOTE — Care Management Obs Status (Signed)
MEDICARE OBSERVATION STATUS NOTIFICATION   Patient Details  Name: [name] MRN: [id number] Date of Birth: 1950-09-01   Medicare Observation Status Notification Given:  Yes    Alexis Goodelleele, Saed Hudlow K, RN 08/30/2016, 10:46 AM

## 2016-08-30 NOTE — Progress Notes (Signed)
Physical Therapy Treatment Patient Details Name: [name] PeonHelen J Simpson MRN: [id number] DOB: 01-23-1951 Today's Date: 08/30/2016    History of Present Illness Pt s/p R patellar fx ~2 weeks ago and with ORIF 08/29/16.  Pt with hx of CAD and lumbar lami in 2014 and 2016    PT Comments    Pt cooperative but limited this pm by ++fatigue.  Reviewed safety in transfers and car transfers.  Pt requests stairs deferred 2* fatigue.  Spouse states they have been using wc on stairs x 2 wks and will do again today and allow HHPT to follow up with stair training.   Follow Up Recommendations  Home health PT     Equipment Recommendations  None recommended by PT    Recommendations for Other Services       Precautions / Restrictions Precautions Precautions: Fall;Other (comment) Precaution Comments: No Knee flex on R Required Braces or Orthoses: Knee Immobilizer - Right Knee Immobilizer - Right: On at all times Restrictions Weight Bearing Restrictions: No Other Position/Activity Restrictions: WBAT    Mobility  Bed Mobility Overal bed mobility: Needs Assistance Bed Mobility: Supine to Sit     Supine to sit: Min assist     General bed mobility comments: cues for sequence and min assist with R LE  Transfers Overall transfer level: Needs assistance Equipment used: Rolling walker (2 wheeled) Transfers: Sit to/from Stand Sit to Stand: Min assist;Min guard         General transfer comment: cues for LE management and use of UEs to self assist  Ambulation/Gait Ambulation/Gait assistance: Min guard Ambulation Distance (Feet): 15 Feet (To/from bathroom) Assistive device: Rolling walker (2 wheeled) Gait Pattern/deviations: Step-to pattern;Decreased step length - right;Decreased step length - left;Shuffle;Trunk flexed Gait velocity: decr Gait velocity interpretation: Below normal speed for age/gender General Gait Details: cues for sequence, posture and position from Roh[employer] and HaasW   Stairs             Wheelchair Mobility    Modified Rankin (Stroke Patients Only)       Balance                                            Cognition Arousal/Alertness: Awake/alert Behavior During Therapy: WFL for tasks assessed/performed Overall Cognitive Status: Within Functional Limits for tasks assessed                                        Exercises General Exercises - Lower Extremity Ankle Circles/Pumps: AROM;Both;15 reps;Supine    General Comments        Pertinent Vitals/Pain Pain Assessment: 0-10 Pain Score: 4  Pain Location: R knee Pain Descriptors / Indicators: Sore Pain Intervention(s): Limited activity within patient's tolerance;Monitored during session;Premedicated before session    Home Living Family/patient expects to be discharged to:: Private residence Living Arrangements: Spouse/significant other Available Help at Discharge: Family Type of Home: House Home Access: Stairs to enter Entrance Stairs-Rails: Right;Left Home Layout: One level Home Equipment: Environmental consultantWalker - 2 wheels;Bedside commode;Wheelchair - manual;Cane - single point      Prior Function Level of Independence: Needs assistance  Gait / Transfers Assistance Needed: Pt has been using RW since patellar fx and spouse has been using wc up/down stairs  ADL's / Homemaking Assistance Needed: assist of spouse x several weeks  PT Goals (current goals can now be found in the care plan section) Acute Rehab PT Goals Patient Stated Goal: Regain IND PT Goal Formulation: With patient Time For Goal Achievement: 08/31/16 Potential to Achieve Goals: Good Progress towards PT goals: Progressing toward goals    Frequency    Min 6X/week      PT Plan Current plan remains appropriate    Co-evaluation              AM-PAC PT "6 Clicks" Daily Activity  Outcome Measure  Difficulty turning over in bed (including adjusting bedclothes, sheets and blankets)?: A Little Difficulty  moving from lying on back to sitting on the side of the bed? : A Little Difficulty sitting down on and standing up from a chair with arms (e.g., wheelchair, bedside commode, etc,.)?: A Little Help needed moving to and from a bed to chair (including a wheelchair)?: A Little Help needed walking in hospital room?: A Little Help needed climbing 3-5 steps with a railing? : A Lot 6 Click Score: 17    End of Session Equipment Utilized During Treatment: Gait belt;Right knee immobilizer Activity Tolerance: Patient limited by fatigue Patient left: in chair;with call bell/phone within reach;with family/visitor present Nurse Communication: Mobility status PT Visit Diagnosis: History of falling (Z91.81);Difficulty in walking, not elsewhere classified (R26.2)     Time: 1355-1406 PT Time Calculation (min) (ACUTE ONLY): 11 min  Charges:  $Therapeutic Activity: 8-22 mins                    G Codes:       Pg [phone]    [name] 08/30/2016, 3:16 PM

## 2016-08-30 NOTE — Progress Notes (Signed)
Discharge planning, spoke with patient and spouse at beside. Chose [facility] for HH services, contacted [facility] for referral. Has RW and 3-n-1. [phone] 

## 2016-08-30 NOTE — Addendum Note (Signed)
Addendum  created 08/30/16 1030 by Elyn PeersAllen, Lemario Chaikin J, CRNA   Charge Capture section accepted

## 2016-09-01 DIAGNOSIS — Z7901 Long term (current) use of anticoagulants: Secondary | ICD-10-CM | POA: Diagnosis not present

## 2016-09-01 DIAGNOSIS — I10 Essential (primary) hypertension: Secondary | ICD-10-CM | POA: Diagnosis not present

## 2016-09-01 DIAGNOSIS — D649 Anemia, unspecified: Secondary | ICD-10-CM | POA: Diagnosis not present

## 2016-09-01 DIAGNOSIS — K219 Gastro-esophageal reflux disease without esophagitis: Secondary | ICD-10-CM | POA: Diagnosis not present

## 2016-09-01 DIAGNOSIS — Z955 Presence of coronary angioplasty implant and graft: Secondary | ICD-10-CM | POA: Diagnosis not present

## 2016-09-01 DIAGNOSIS — E119 Type 2 diabetes mellitus without complications: Secondary | ICD-10-CM | POA: Diagnosis not present

## 2016-09-01 DIAGNOSIS — G4730 Sleep apnea, unspecified: Secondary | ICD-10-CM | POA: Diagnosis not present

## 2016-09-01 DIAGNOSIS — E785 Hyperlipidemia, unspecified: Secondary | ICD-10-CM | POA: Diagnosis not present

## 2016-09-01 DIAGNOSIS — S82001D Unspecified fracture of right patella, subsequent encounter for closed fracture with routine healing: Secondary | ICD-10-CM | POA: Diagnosis not present

## 2016-09-01 DIAGNOSIS — I25119 Atherosclerotic heart disease of native coronary artery with unspecified angina pectoris: Secondary | ICD-10-CM | POA: Diagnosis not present

## 2016-09-01 DIAGNOSIS — Z7982 Long term (current) use of aspirin: Secondary | ICD-10-CM | POA: Diagnosis not present

## 2016-09-01 DIAGNOSIS — Z7984 Long term (current) use of oral hypoglycemic drugs: Secondary | ICD-10-CM | POA: Diagnosis not present

## 2016-09-01 DIAGNOSIS — M545 Low back pain: Secondary | ICD-10-CM | POA: Diagnosis not present

## 2016-09-03 ENCOUNTER — Encounter: Payer: Self-pay | Admitting: Family Medicine

## 2016-09-04 MED FILL — METOPROLOL SUCC ER 25 MG TA: 25 | 30 days supply | Qty: 15 | Fill #3

## 2016-09-04 MED FILL — METFORMIN HCL ER 500 MG TAB: 500 | 90 days supply | Qty: 90 | Fill #2

## 2016-09-09 ENCOUNTER — Other Ambulatory Visit: Payer: Self-pay | Admitting: Family Medicine

## 2016-09-09 DIAGNOSIS — Z7984 Long term (current) use of oral hypoglycemic drugs: Secondary | ICD-10-CM | POA: Diagnosis not present

## 2016-09-09 DIAGNOSIS — S82001D Unspecified fracture of right patella, subsequent encounter for closed fracture with routine healing: Secondary | ICD-10-CM | POA: Diagnosis not present

## 2016-09-09 DIAGNOSIS — K219 Gastro-esophageal reflux disease without esophagitis: Secondary | ICD-10-CM | POA: Diagnosis not present

## 2016-09-09 DIAGNOSIS — E785 Hyperlipidemia, unspecified: Secondary | ICD-10-CM | POA: Diagnosis not present

## 2016-09-09 DIAGNOSIS — I10 Essential (primary) hypertension: Secondary | ICD-10-CM | POA: Diagnosis not present

## 2016-09-09 DIAGNOSIS — Z7982 Long term (current) use of aspirin: Secondary | ICD-10-CM | POA: Diagnosis not present

## 2016-09-09 DIAGNOSIS — D649 Anemia, unspecified: Secondary | ICD-10-CM | POA: Diagnosis not present

## 2016-09-09 DIAGNOSIS — M545 Low back pain: Secondary | ICD-10-CM | POA: Diagnosis not present

## 2016-09-09 DIAGNOSIS — Z7901 Long term (current) use of anticoagulants: Secondary | ICD-10-CM | POA: Diagnosis not present

## 2016-09-09 DIAGNOSIS — Z955 Presence of coronary angioplasty implant and graft: Secondary | ICD-10-CM | POA: Diagnosis not present

## 2016-09-09 DIAGNOSIS — I25119 Atherosclerotic heart disease of native coronary artery with unspecified angina pectoris: Secondary | ICD-10-CM | POA: Diagnosis not present

## 2016-09-09 DIAGNOSIS — E119 Type 2 diabetes mellitus without complications: Secondary | ICD-10-CM | POA: Diagnosis not present

## 2016-09-09 DIAGNOSIS — G4730 Sleep apnea, unspecified: Secondary | ICD-10-CM | POA: Diagnosis not present

## 2016-09-11 MED FILL — CYCLOBENZAPRINE 10 MG TABLE: 10 | 15 days supply | Qty: 30 | Fill #0

## 2016-09-12 DIAGNOSIS — S82091D Other fracture of right patella, subsequent encounter for closed fracture with routine healing: Secondary | ICD-10-CM | POA: Diagnosis not present

## 2016-09-12 MED FILL — HYDROCODON-APAP 5-325: 5-325 | 7 days supply | Qty: 60 | Fill #0

## 2016-09-13 DIAGNOSIS — D649 Anemia, unspecified: Secondary | ICD-10-CM | POA: Diagnosis not present

## 2016-09-13 DIAGNOSIS — M545 Low back pain: Secondary | ICD-10-CM | POA: Diagnosis not present

## 2016-09-13 DIAGNOSIS — Z955 Presence of coronary angioplasty implant and graft: Secondary | ICD-10-CM | POA: Diagnosis not present

## 2016-09-13 DIAGNOSIS — I10 Essential (primary) hypertension: Secondary | ICD-10-CM | POA: Diagnosis not present

## 2016-09-13 DIAGNOSIS — Z7901 Long term (current) use of anticoagulants: Secondary | ICD-10-CM | POA: Diagnosis not present

## 2016-09-13 DIAGNOSIS — K219 Gastro-esophageal reflux disease without esophagitis: Secondary | ICD-10-CM | POA: Diagnosis not present

## 2016-09-13 DIAGNOSIS — I25119 Atherosclerotic heart disease of native coronary artery with unspecified angina pectoris: Secondary | ICD-10-CM | POA: Diagnosis not present

## 2016-09-13 DIAGNOSIS — G4730 Sleep apnea, unspecified: Secondary | ICD-10-CM | POA: Diagnosis not present

## 2016-09-13 DIAGNOSIS — E785 Hyperlipidemia, unspecified: Secondary | ICD-10-CM | POA: Diagnosis not present

## 2016-09-13 DIAGNOSIS — E119 Type 2 diabetes mellitus without complications: Secondary | ICD-10-CM | POA: Diagnosis not present

## 2016-09-13 DIAGNOSIS — S82001D Unspecified fracture of right patella, subsequent encounter for closed fracture with routine healing: Secondary | ICD-10-CM | POA: Diagnosis not present

## 2016-09-13 DIAGNOSIS — Z7984 Long term (current) use of oral hypoglycemic drugs: Secondary | ICD-10-CM | POA: Diagnosis not present

## 2016-09-13 DIAGNOSIS — Z7982 Long term (current) use of aspirin: Secondary | ICD-10-CM | POA: Diagnosis not present

## 2016-09-13 NOTE — Progress Notes (Signed)
   08/30/16 1500  PT Time Calculation  PT Start Time (ACUTE ONLY) 0933  PT Stop Time (ACUTE ONLY) 0955  PT Time Calculation (min) (ACUTE ONLY) 22 min  PT G-Codes **NOT FOR INPATIENT CLASS**  Functional Assessment Tool Used Clinical judgement  Functional Limitation Mobility: Walking and moving around  Mobility: Walking and Moving Around Current Status (Z6109(G8978) CJ  Mobility: Walking and Moving Around Goal Status (U0454(G8979) CI  PT General Charges  $$ ACUTE PT VISIT 1 Procedure  PT Evaluation  $PT Eval Low Complexity 1 Procedure

## 2016-09-18 DIAGNOSIS — M25561 Pain in right knee: Secondary | ICD-10-CM | POA: Diagnosis not present

## 2016-09-20 DIAGNOSIS — M25561 Pain in right knee: Secondary | ICD-10-CM | POA: Diagnosis not present

## 2016-09-20 DIAGNOSIS — S82001A Unspecified fracture of right patella, initial encounter for closed fracture: Secondary | ICD-10-CM | POA: Diagnosis not present

## 2016-09-24 DIAGNOSIS — M25561 Pain in right knee: Secondary | ICD-10-CM | POA: Diagnosis not present

## 2016-09-26 MED FILL — SIMVASTATIN 40 MG TABLET: 40 | 90 days supply | Qty: 90 | Fill #0

## 2016-09-27 DIAGNOSIS — M25561 Pain in right knee: Secondary | ICD-10-CM | POA: Diagnosis not present

## 2016-10-01 DIAGNOSIS — M25561 Pain in right knee: Secondary | ICD-10-CM | POA: Diagnosis not present

## 2016-10-02 ENCOUNTER — Encounter: Payer: Self-pay | Admitting: Internal Medicine

## 2016-10-02 ENCOUNTER — Ambulatory Visit (INDEPENDENT_AMBULATORY_CARE_PROVIDER_SITE_OTHER): Payer: PPO | Admitting: Internal Medicine

## 2016-10-02 VITALS — BP 138/62 | HR 75 | Ht 61.0 in | Wt 214.6 lb

## 2016-10-02 DIAGNOSIS — I2583 Coronary atherosclerosis due to lipid rich plaque: Secondary | ICD-10-CM | POA: Diagnosis not present

## 2016-10-02 DIAGNOSIS — I10 Essential (primary) hypertension: Secondary | ICD-10-CM

## 2016-10-02 DIAGNOSIS — R42 Dizziness and giddiness: Secondary | ICD-10-CM | POA: Diagnosis not present

## 2016-10-02 DIAGNOSIS — I2510 Atherosclerotic heart disease of native coronary artery without angina pectoris: Secondary | ICD-10-CM

## 2016-10-02 NOTE — Progress Notes (Signed)
Date:  10/02/2016   ID:  [name], DOB 13-Feb-1951, MRN [id number]  PCP:  Pearline Cables, MD  Primary Cardiologist:  Aesha Agrawal  CC:  Occasional dizziness, recent broken leg   History of Present Illness: [name] is a 66 y.o. female with a history of CAD with DES stent to prox. LAD in 2010 with residual disease in LCX 10-20% and RCA 20% lesion.  She also has HTN, DM, dyslipidemia and obesity.  She presented on 10/01/12 to the ED with c/o chest pressure. Discomfort began at work and associated with flushing symptoms secondary to niacin. No DOE, SOB, nausea or diaphoresis. She then developed chest pressure and nausea and went to Urgent care. She thought the flushing was secondary to stopping ASA. She was given ASA, cooled down and then had chest presssure. Sent to [facility] ER and NTG SL did resolve the pressure. She was very concerned about the pressure because it was the same that she had with her Lexiscan in 2010 that lead to her stent.  Coronary angiogram revealed widely patent proximal LAD stent with otherwise normal coronary arteries and normal LV function.  [name] recently underwent back surgery and is doing fairly well. She still is having some back pain but is adamant about not taking medication. She plans to go back to work on Monday next week. Unfortunately she is still grieving from her brother who passed away last week. He had stage IV pancreatic cancer and lived less than one year.  [name] returns today for follow-up. Overall she denies any chest pain or worsening shortness of breath. Recently she's been having some palpitations, however she attributes that to stress. She recently is been placed on CPAP and seems to be tolerating that well. She's had some problems with memory loss but neurologic workup is been unremarkable. She recently had worsening back problems and has been to see Dr. Shelle Iron. He is considering surgery on her back but would need her to come off of her  antiplatelets medications.  At the pleasure seeing [name] back today in follow-up. She underwent successful back surgery and is doing better. She's managed to lose significant amount of weight. She's remained on Toprol which was started preoperatively. Her surgery was without complication. Recently she's had some dizziness with change in position. This could be related to dietary changes however I did note that her diastolic pressure is low today at 56. She denies any chest pain or worsening shortness of breath.  02/12/2016  [name] returns today for follow-up. She is recently been having some more palpitations. She reports being under significant stress as her daughter and granddaughter are now living with them. She gets some fullness in her chest when she has palpitations. These episodes occur 2-3 times a week over the past month or so. She denies any exertional chest pain or worsening shortness of breath. Weight is [name] down 3 pounds since her last office visit. Blood pressure is well-controlled. She recently started Celebrex and I spoke with her primary care provider about this. She needs to be careful about taking it regularly in the setting of aspirin and Plavix use.  03/07/2016  I saw [name] back today for follow-up of her monitor. She reports she had 2 episodes of palpitations and some elevated blood pressure on the day of Thanksgiving. She was dealing with her sister who had recently had a brain bleed and had to take her to the emergency room. She apparently had  some seizures. She was also trying to cook Thanksgiving meals and felt overwhelmed. She did take some Valium with some benefit however one point said her blood pressure was up to 210/100. Since then she's felt much better. We did not to capture any episodes of arrhythmias, extrasystoles or anything concerning on her monitor. I suspect her symptoms are related to stress and anxiety. Blood pressure is fairly well-controlled  today.  10/02/2016  [name] returns today for follow-up. Unfortunate she recently broke her leg. She says she has a problem with some foot drag and she misstepped. She had have surgery and is still wearing a brace in the right leg. She is improving and has better range of motion. Blood pressure is good today 138/62. She reports recently she's been having some dizziness. Sometimes positional with sitting up at other times not positional. She is afraid of falling. She's been very anxious. In fact she's been taking Valium although she says only a few times a week. She does not think that is related to either the Valium or her pain medicine. She is concerned it could be her blood pressure although has not noted any low blood pressure readings.  Wt Readings from Last 3 Encounters:  10/02/16 214 lb 9.6 oz (97.3 kg)  08/29/16 215 lb (97.5 kg)  08/27/16 215 lb (97.5 kg)     Past Medical History:  Diagnosis Date  . Anemia   . Anginal pain (HCC)    pt relates to stress pt was seen by Dr Rennis Golden 3 wks ago was given metoprolol   . Bronchitis    hx of   . CAD (coronary artery disease)    PCI to LAD in 2010, mild residual disease in LCX and RCA  . Cataracts, bilateral   . Diabetes mellitus without complication (HCC)   . Family history of adverse reaction to anesthesia    pts mother had difficulty awakening   . GERD (gastroesophageal reflux disease)   . Hyperlipidemia LDL goal < 70   . Hypertension   . Lumbar back pain   . Numbness    left leg and foot   . Plantar fascia rupture    Left Foot  . Sleep apnea    uses CPAP     Current Outpatient Prescriptions  Medication Sig Dispense Refill  . aspirin EC 81 MG EC tablet Take 1 tablet (81 mg total) by mouth daily. 30 tablet 1  . calcium carbonate (OS-CAL) 1250 (500 Ca) MG chewable tablet Chew 1 tablet by mouth daily.    . cetirizine (ZYRTEC) 10 MG chewable tablet Chew 10 mg by mouth daily as needed for allergies.    . cholecalciferol (VITAMIN D)  1000 UNITS tablet Take 1,000 Units by mouth daily.    . clopidogrel (PLAVIX) 75 MG tablet TAKE 1 TABLET BY MOUTH DAILY. 90 tablet 1  . cyclobenzaprine (FLEXERIL) 10 MG tablet TAKE 1 TABLET BY MOUTH TWICE DAILY AS NEEDED FOR MUSCLE SPASMS 30 tablet 0  . diazepam (VALIUM) 2 MG tablet TAKE 1 TABLET BY MOUTH EVERY 8 HOURS AS NEEDED FOR MUSCLE SPASMS 30 tablet 0  . famotidine (PEPCID) 20 MG tablet TAKE 1 TABLET BY MOUTH 2 TIMES DAILY. 180 tablet 2  . fluticasone (FLONASE) 50 MCG/ACT nasal spray Place 1 spray into both nostrils daily as needed for allergies or rhinitis.    Marland Kitchen HYDROcodone-acetaminophen (NORCO/VICODIN) 5-325 MG tablet Take 1-2 tablets by mouth every 4 (four) hours as needed (breakthrough pain). 60 tablet 0  . metFORMIN (  GLUCOPHAGE-XR) 500 MG 24 hr tablet Take 1 tablet (500 mg total) by mouth daily with breakfast. 90 tablet 3  . metoprolol succinate (TOPROL-XL) 25 MG 24 hr tablet TAKE 1/2 TABLET BY MOUTH DAILY 15 tablet 4  . simvastatin (ZOCOR) 40 MG tablet Take 1 tablet (40 mg total) by mouth every evening. 90 tablet 3  . telmisartan (MICARDIS) 40 MG tablet TAKE 1 TABLET BY MOUTH EVERY MORNING 90 tablet 3   No current facility-administered medications for this visit.     Allergies:    Allergies  Allergen Reactions  . Niacin And Related Other (See Comments)    Whelps and skin flushed, mouth tingling  . Tolectin [Tolmetin] Rash    Social History:  The patient  reports that she has never smoked. She has never used smokeless tobacco. She reports that she drinks alcohol. She reports that she does not use drugs.   Family history:   Family History  Problem Relation Age of Onset  . Coronary artery disease Mother   . Rheum arthritis Mother   . Dementia Mother   . Heart attack Father   . Hypertension Father   . Hyperlipidemia Father   . Other Father        MVA  . Hypertension Brother   . Cancer Brother   . Heart disease Brother   . Cancer Paternal Grandmother        stomach  .  Diabetes Paternal Grandfather   . Breast cancer Neg Hx     ROS: Pertinent items noted in HPI and remainder of comprehensive ROS otherwise negative.   PHYSICAL EXAM: VS:  BP 138/62   Pulse 75   Ht 5\' 1"  (1.549 m)   Wt 214 lb 9.6 oz (97.3 kg)   LMP  (LMP Unknown)   BMI 40.55 kg/m  General appearance: alert, no distress and morbidly obese Neck: no carotid bruit, no JVD and thyroid not enlarged, symmetric, no tenderness/mass/nodules Lungs: clear to auscultation bilaterally Heart: regular rate and rhythm Abdomen: soft, non-tender; bowel sounds normal; no masses,  no organomegaly Extremities: Right leg in an immobilizer Pulses: 2+ and symmetric Skin: Skin color, texture, turgor normal. No rashes or lesions Neurologic: Grossly normal Psych: Mildly anxious  EKG: Normal sinus rhythm at 76  ASSESSMENT: 1. Dizziness 2. Coronary artery disease status post DES the proximal LAD in 2010 3. Hypertension 4. Dyslipidemia 5. Obesity 6. Type 2 diabetes 7. Palpitations 8. OSA on CPAP 9. Anxiety  PLAN:  1.  Mrs. Nathanson is recovering from recent right leg fracture surgery. She still in an immobilizer. She's having some dizziness. She says sometimes positional but other times can occur just standing still. She's never had any low blood pressures and I'm aware of. I don't think that this is an arrhythmia and she denies any palpitations or tachycardia. I suspect is more likely due to neuropathy and/or an inner ear problem. She has a number of your problems in the past as well as recurrent surgery for tympanoplasty. I've encouraged her to follow-up with her primary care provider regarding this.  Follow-up 6 months or sooner as necessary.  Chrystie NoseKenneth C. Wanetta Funderburke, MD, [facility] Attending Cardiologist [facility]

## 2016-10-02 NOTE — Patient Instructions (Addendum)
Your physician wants you to follow-up in: 12 months with Dr. Hilty. You will receive a reminder letter in the mail two months in advance. If you don't receive a letter, please call our office to schedule the follow-up appointment.  

## 2016-10-04 DIAGNOSIS — M25561 Pain in right knee: Secondary | ICD-10-CM | POA: Diagnosis not present

## 2016-10-08 DIAGNOSIS — M25561 Pain in right knee: Secondary | ICD-10-CM | POA: Diagnosis not present

## 2016-10-09 ENCOUNTER — Other Ambulatory Visit: Payer: Self-pay | Admitting: Internal Medicine

## 2016-10-09 MED FILL — METOPROLOL SUCC ER 25 MG TA: 25 | 90 days supply | Qty: 45 | Fill #0

## 2016-10-09 MED FILL — TELMISARTAN 40 MG TAB: 40 | 90 days supply | Qty: 90 | Fill #1

## 2016-10-09 NOTE — Telephone Encounter (Signed)
Rx has been sent to the pharmacy electronically. ° °

## 2016-10-10 DIAGNOSIS — M25561 Pain in right knee: Secondary | ICD-10-CM | POA: Diagnosis not present

## 2016-10-10 DIAGNOSIS — S82091D Other fracture of right patella, subsequent encounter for closed fracture with routine healing: Secondary | ICD-10-CM | POA: Diagnosis not present

## 2016-10-15 DIAGNOSIS — M25561 Pain in right knee: Secondary | ICD-10-CM | POA: Diagnosis not present

## 2016-10-18 DIAGNOSIS — M25561 Pain in right knee: Secondary | ICD-10-CM | POA: Diagnosis not present

## 2016-10-21 DIAGNOSIS — M25561 Pain in right knee: Secondary | ICD-10-CM | POA: Diagnosis not present

## 2016-10-23 DIAGNOSIS — M25561 Pain in right knee: Secondary | ICD-10-CM | POA: Diagnosis not present

## 2016-10-28 DIAGNOSIS — M25561 Pain in right knee: Secondary | ICD-10-CM | POA: Diagnosis not present

## 2016-10-30 DIAGNOSIS — M25561 Pain in right knee: Secondary | ICD-10-CM | POA: Diagnosis not present

## 2016-10-31 MED FILL — FAMOTIDINE 20 MG TABLET: 20 | 90 days supply | Qty: 180 | Fill #1

## 2016-11-01 DIAGNOSIS — M25561 Pain in right knee: Secondary | ICD-10-CM | POA: Diagnosis not present

## 2016-11-05 DIAGNOSIS — M25561 Pain in right knee: Secondary | ICD-10-CM | POA: Diagnosis not present

## 2016-11-08 DIAGNOSIS — M25561 Pain in right knee: Secondary | ICD-10-CM | POA: Diagnosis not present

## 2016-11-12 DIAGNOSIS — M25561 Pain in right knee: Secondary | ICD-10-CM | POA: Diagnosis not present

## 2016-11-15 DIAGNOSIS — M25561 Pain in right knee: Secondary | ICD-10-CM | POA: Diagnosis not present

## 2016-11-20 MED FILL — CLOPIDOGREL 75 MG TABLET: 75 | 90 days supply | Qty: 90 | Fill #1

## 2016-11-21 DIAGNOSIS — S82091D Other fracture of right patella, subsequent encounter for closed fracture with routine healing: Secondary | ICD-10-CM | POA: Diagnosis not present

## 2016-11-26 DIAGNOSIS — H903 Sensorineural hearing loss, bilateral: Secondary | ICD-10-CM | POA: Diagnosis not present

## 2016-11-28 DIAGNOSIS — M25561 Pain in right knee: Secondary | ICD-10-CM | POA: Diagnosis not present

## 2016-12-09 MED FILL — METFORMIN HCL ER 500 MG TAB: 500 | 90 days supply | Qty: 90 | Fill #0

## 2016-12-11 ENCOUNTER — Telehealth: Payer: Self-pay | Admitting: Family Medicine

## 2016-12-11 ENCOUNTER — Emergency Department (HOSPITAL_COMMUNITY): Payer: PPO

## 2016-12-11 ENCOUNTER — Encounter (HOSPITAL_COMMUNITY): Payer: Self-pay

## 2016-12-11 ENCOUNTER — Observation Stay (HOSPITAL_COMMUNITY)
Admission: EM | Admit: 2016-12-11 | Discharge: 2016-12-12 | Disposition: A | Discharge status: Home / Self Care | Payer: PPO | Attending: Cardiovascular Disease | Admitting: Cardiovascular Disease

## 2016-12-11 DIAGNOSIS — Z888 Allergy status to other drugs, medicaments and biological substances status: Secondary | ICD-10-CM | POA: Diagnosis not present

## 2016-12-11 DIAGNOSIS — E785 Hyperlipidemia, unspecified: Secondary | ICD-10-CM | POA: Insufficient documentation

## 2016-12-11 DIAGNOSIS — Z79891 Long term (current) use of opiate analgesic: Secondary | ICD-10-CM | POA: Insufficient documentation

## 2016-12-11 DIAGNOSIS — I10 Essential (primary) hypertension: Secondary | ICD-10-CM | POA: Insufficient documentation

## 2016-12-11 DIAGNOSIS — Z9861 Coronary angioplasty status: Secondary | ICD-10-CM

## 2016-12-11 DIAGNOSIS — G4733 Obstructive sleep apnea (adult) (pediatric): Secondary | ICD-10-CM

## 2016-12-11 DIAGNOSIS — Z79899 Other long term (current) drug therapy: Secondary | ICD-10-CM | POA: Insufficient documentation

## 2016-12-11 DIAGNOSIS — M48062 Spinal stenosis, lumbar region with neurogenic claudication: Secondary | ICD-10-CM | POA: Diagnosis present

## 2016-12-11 DIAGNOSIS — R079 Chest pain, unspecified: Principal | ICD-10-CM

## 2016-12-11 DIAGNOSIS — E1159 Type 2 diabetes mellitus with other circulatory complications: Secondary | ICD-10-CM | POA: Diagnosis present

## 2016-12-11 DIAGNOSIS — Z791 Long term (current) use of non-steroidal anti-inflammatories (NSAID): Secondary | ICD-10-CM | POA: Diagnosis not present

## 2016-12-11 DIAGNOSIS — K219 Gastro-esophageal reflux disease without esophagitis: Secondary | ICD-10-CM | POA: Diagnosis not present

## 2016-12-11 DIAGNOSIS — Z6841 Body Mass Index (BMI) 40.0 and over, adult: Secondary | ICD-10-CM | POA: Insufficient documentation

## 2016-12-11 DIAGNOSIS — E118 Type 2 diabetes mellitus with unspecified complications: Secondary | ICD-10-CM | POA: Diagnosis present

## 2016-12-11 DIAGNOSIS — E66812 Obesity, class 2: Secondary | ICD-10-CM | POA: Diagnosis present

## 2016-12-11 DIAGNOSIS — I152 Hypertension secondary to endocrine disorders: Secondary | ICD-10-CM | POA: Diagnosis present

## 2016-12-11 DIAGNOSIS — Z9989 Dependence on other enabling machines and devices: Secondary | ICD-10-CM

## 2016-12-11 DIAGNOSIS — I200 Unstable angina: Secondary | ICD-10-CM | POA: Diagnosis not present

## 2016-12-11 DIAGNOSIS — R42 Dizziness and giddiness: Secondary | ICD-10-CM

## 2016-12-11 DIAGNOSIS — I2510 Atherosclerotic heart disease of native coronary artery without angina pectoris: Secondary | ICD-10-CM

## 2016-12-11 DIAGNOSIS — Z7982 Long term (current) use of aspirin: Secondary | ICD-10-CM | POA: Insufficient documentation

## 2016-12-11 DIAGNOSIS — Z955 Presence of coronary angioplasty implant and graft: Secondary | ICD-10-CM | POA: Diagnosis not present

## 2016-12-11 DIAGNOSIS — E119 Type 2 diabetes mellitus without complications: Secondary | ICD-10-CM | POA: Diagnosis not present

## 2016-12-11 DIAGNOSIS — E1169 Type 2 diabetes mellitus with other specified complication: Secondary | ICD-10-CM

## 2016-12-11 DIAGNOSIS — Z7984 Long term (current) use of oral hypoglycemic drugs: Secondary | ICD-10-CM | POA: Diagnosis not present

## 2016-12-11 LAB — I-STAT TROPONIN, ED: TROPONIN I, POC: 0 ng/mL (ref 0.00–0.08)

## 2016-12-11 LAB — CBC
HCT: 38.6 % (ref 36.0–46.0)
Hemoglobin: 12.2 g/dL (ref 12.0–15.0)
MCH: 24.8 pg — AB (ref 26.0–34.0)
MCHC: 31.6 g/dL (ref 30.0–36.0)
MCV: 78.5 fL (ref 78.0–100.0)
PLATELETS: 324 10*3/uL (ref 150–400)
RBC: 4.92 MIL/uL (ref 3.87–5.11)
RDW: 14.2 % (ref 11.5–15.5)
WBC: 12.8 10*3/uL — ABNORMAL HIGH (ref 4.0–10.5)

## 2016-12-11 LAB — CBG MONITORING, ED: Glucose-Capillary: 150 mg/dL — ABNORMAL HIGH (ref 65–99)

## 2016-12-11 LAB — BASIC METABOLIC PANEL
Anion gap: 10 (ref 5–15)
BUN: 13 mg/dL (ref 6–20)
CALCIUM: 10.2 mg/dL (ref 8.9–10.3)
CO2: 24 mmol/L (ref 22–32)
CREATININE: 0.78 mg/dL (ref 0.44–1.00)
Chloride: 106 mmol/L (ref 101–111)
GFR calc Af Amer: >60 mL/min (ref >60)
GLUCOSE: 132 mg/dL — AB (ref 65–99)
POTASSIUM: 4 mmol/L (ref 3.5–5.1)
SODIUM: 140 mmol/L (ref 135–145)

## 2016-12-11 LAB — TROPONIN I

## 2016-12-11 MED ORDER — CALCIUM CARBONATE 1250 (500 CA) MG PO TABS
1.0000 | ORAL_TABLET | Freq: Every day | ORAL | Status: DC
  Filled 2016-12-11: qty 1

## 2016-12-11 MED ORDER — FLUTICASONE PROPIONATE 50 MCG/ACT NA SUSP: 1.0000 | Freq: Every day | NASAL | Status: DC | PRN

## 2016-12-11 MED ORDER — HEPARIN BOLUS VIA INFUSION
4000.0000 [IU] | Freq: Once | INTRAVENOUS | Status: AC
  Administered 2016-12-11: 4000 [IU] via INTRAVENOUS
  Filled 2016-12-11: qty 4000

## 2016-12-11 MED ORDER — ACETAMINOPHEN 325 MG PO TABS
650.0000 mg | ORAL_TABLET | ORAL | Status: DC | PRN
  Administered 2016-12-12: 650 mg via ORAL
  Filled 2016-12-11: qty 2

## 2016-12-11 MED ORDER — IRBESARTAN 150 MG PO TABS
150.0000 mg | ORAL_TABLET | Freq: Every day | ORAL | Status: DC
  Administered 2016-12-12: 150 mg via ORAL
  Filled 2016-12-11 (×2): qty 1

## 2016-12-11 MED ORDER — ASPIRIN 81 MG PO CHEW
324.0000 mg | CHEWABLE_TABLET | ORAL | Status: AC
  Administered 2016-12-11: 324 mg via ORAL
  Filled 2016-12-11: qty 4

## 2016-12-11 MED ORDER — NITROGLYCERIN IN D5W 200-5 MCG/ML-% IV SOLN
0.0000 ug/min | Freq: Once | INTRAVENOUS | Status: AC
  Administered 2016-12-11: 5 ug/min via INTRAVENOUS
  Filled 2016-12-11: qty 250

## 2016-12-11 MED ORDER — METOPROLOL SUCCINATE ER 25 MG PO TB24
12.5000 mg | ORAL_TABLET | Freq: Every day | ORAL | Status: DC
  Administered 2016-12-12: 12.5 mg via ORAL
  Filled 2016-12-11 (×3): qty 1

## 2016-12-11 MED ORDER — ONDANSETRON HCL 4 MG/2ML IJ SOLN: 4.0000 mg | Freq: Four times a day (QID) | INTRAMUSCULAR | Status: DC | PRN

## 2016-12-11 MED ORDER — HYDROCODONE-ACETAMINOPHEN 5-325 MG PO TABS: 1.0000 | ORAL_TABLET | ORAL | Status: DC | PRN

## 2016-12-11 MED ORDER — CALCIUM CARBONATE 1250 (500 CA) MG PO CHEW: 1250.0000 mg | CHEWABLE_TABLET | Freq: Every day | ORAL | Status: DC

## 2016-12-11 MED ORDER — ASPIRIN 81 MG PO TBEC: 81.0000 mg | DELAYED_RELEASE_TABLET | Freq: Every day | ORAL | Status: DC

## 2016-12-11 MED ORDER — CLOPIDOGREL BISULFATE 75 MG PO TABS
75.0000 mg | ORAL_TABLET | Freq: Every day | ORAL | Status: DC
Start: 2016-12-11 — End: 2016-12-12
  Administered 2016-12-12: 75 mg via ORAL
  Filled 2016-12-11 (×2): qty 1

## 2016-12-11 MED ORDER — ASPIRIN 300 MG RE SUPP: 300.0000 mg | RECTAL | Status: AC

## 2016-12-11 MED ORDER — NITROGLYCERIN 0.4 MG SL SUBL: 0.4000 mg | SUBLINGUAL_TABLET | SUBLINGUAL | Status: DC | PRN

## 2016-12-11 MED ORDER — ADULT MULTIVITAMIN W/MINERALS CH
1.0000 | ORAL_TABLET | Freq: Every day | ORAL | Status: DC
  Administered 2016-12-12: 1 via ORAL
  Filled 2016-12-11: qty 1

## 2016-12-11 MED ORDER — METFORMIN HCL ER 500 MG PO TB24
500.0000 mg | ORAL_TABLET | Freq: Every day | ORAL | Status: DC
  Filled 2016-12-11: qty 1

## 2016-12-11 MED ORDER — FAMOTIDINE 20 MG PO TABS
20.0000 mg | ORAL_TABLET | Freq: Two times a day (BID) | ORAL | Status: DC
  Administered 2016-12-11 – 2016-12-12 (×2): 20 mg via ORAL
  Filled 2016-12-11 (×2): qty 1

## 2016-12-11 MED ORDER — ASPIRIN EC 81 MG PO TBEC: 81.0000 mg | DELAYED_RELEASE_TABLET | Freq: Every day | ORAL | Status: DC

## 2016-12-11 MED ORDER — LORATADINE 10 MG PO TABS
10.0000 mg | ORAL_TABLET | Freq: Every day | ORAL | Status: DC
  Filled 2016-12-11 (×2): qty 1

## 2016-12-11 MED ORDER — ASPIRIN EC 81 MG PO TBEC
81.0000 mg | DELAYED_RELEASE_TABLET | Freq: Every day | ORAL | Status: DC
  Administered 2016-12-12: 81 mg via ORAL
  Filled 2016-12-11: qty 1

## 2016-12-11 MED ORDER — ACETAMINOPHEN 500 MG PO TABS
500.0000 mg | ORAL_TABLET | Freq: Four times a day (QID) | ORAL | Status: DC | PRN
  Administered 2016-12-11: 500 mg via ORAL
  Filled 2016-12-11: qty 1

## 2016-12-11 MED ORDER — VITAMIN D 1000 UNITS PO TABS
1000.0000 [IU] | ORAL_TABLET | Freq: Every day | ORAL | Status: DC
  Administered 2016-12-12: 1000 [IU] via ORAL
  Filled 2016-12-11 (×2): qty 1

## 2016-12-11 MED ORDER — SIMVASTATIN 40 MG PO TABS
40.0000 mg | ORAL_TABLET | Freq: Every day | ORAL | Status: DC
  Administered 2016-12-12: 40 mg via ORAL
  Filled 2016-12-11 (×2): qty 1

## 2016-12-11 MED ORDER — HEPARIN (PORCINE) IN NACL 100-0.45 UNIT/ML-% IJ SOLN
900.0000 [IU]/h | INTRAMUSCULAR | Status: DC
  Administered 2016-12-11: 900 [IU]/h via INTRAVENOUS
  Filled 2016-12-11: qty 250

## 2016-12-11 NOTE — ED Notes (Signed)
Spoke with the cardiology fellow, Dr. Garen Grams about pt intermittent CP, nitro drip, and heparin drip, and need for step down bed. Provider agreed. Verbal order, read back and verified, for change of level of care provided and entered.

## 2016-12-11 NOTE — Telephone Encounter (Signed)
Patient Name: [name] DOB: 10/30/1950 Initial Comment Caller stated they have a PT with high BP of 170/97 and patient has fallen and hit her head and feeling dizzy. The Drs. office called as the patient disconnected while on hold. Office asked that one of our nurses call the patient. Nurse Assessment Nurse: Charna Elizabeth, RN, Cathy Date/Time (Eastern Time): 12/11/2016 1:12:28 PM Confirm and document reason for call. If symptomatic, describe symptoms. ---Caller states her blood pressure was 170/97 at noon today and she has had dizziness on and for several weeks that is present again today. No injury in the past 3 days. Alert and responsive. Does the patient have any new or worsening symptoms? ---Yes Will a triage be completed? ---Yes Related visit to physician within the last 2 weeks? ---Yes Does the PT have any chronic conditions? (i.e. diabetes, asthma, etc.) ---Yes List chronic conditions. ---Type 2 Diabetes, CAD, Cardiac Stent placed, High Blood Pressure Is this a behavioral health or substance abuse call? ---No Guidelines Guideline Title Affirmed Question Affirmed Notes High Blood Pressure [1] Systolic BP >= 160 OR Diastolic >= 100 AND [2] cardiac or neurologic symptoms (e.g., chest pain, difficulty breathing, unsteady gait, blurred vision) Final Disposition User Go to ED Now Charna Elizabeth, RN, [facility] Referrals [facility] - ED Disagree/Comply: Comply

## 2016-12-11 NOTE — Telephone Encounter (Signed)
Pt called and stated fell a few days ago and is feeling light headed and dizzy and having high blood pressure 187/ 97, pt was on hold for triage nurse to respond but pt hung up, triage will get in contact with pt.

## 2016-12-11 NOTE — Progress Notes (Signed)
ANTICOAGULATION CONSULT NOTE - Initial Consult  Pharmacy Consult for heparin Indication: chest pain/ACS  Allergies  Allergen Reactions  . Niacin And Related Other (See Comments)    Whelps and skin flushed, mouth tingling  . Tolectin [Tolmetin] Rash    Patient Measurements: Height:  (154.9 cm) Weight: 214 lb 8.1 oz (97.3 kg) IBW/kg (Calculated) : 47.8 Heparin Dosing Weight: 71kg  Vital Signs: BP: 128/81 (09/19 2030) Pulse Rate: 86 (09/19 2030)  Labs:  Recent Labs  12/11/16 1634  HGB 12.2  HCT 38.6  PLT 324  CREATININE 0.78    Estimated Creatinine Clearance: 72.8 mL/min (by C-G formula based on SCr of 0.78 mg/dL).   Medical History: Past Medical History:  Diagnosis Date  . Anemia   . Anginal pain (HCC)    pt relates to stress pt was seen by Dr Rennis Golden 3 wks ago was given metoprolol   . Bronchitis    hx of   . CAD (coronary artery disease)    PCI to LAD in 2010, mild residual disease in LCX and RCA  . Cataracts, bilateral   . Diabetes mellitus without complication (HCC)   . Family history of adverse reaction to anesthesia    pts mother had difficulty awakening   . GERD (gastroesophageal reflux disease)   . Hyperlipidemia LDL goal < 70   . Hypertension   . Lumbar back pain   . Numbness    left leg and foot   . Plantar fascia rupture    Left Foot  . Sleep apnea    uses CPAP     Medications:  Infusions:  . heparin      Assessment: 33 yof presented to the ED with CP. To start IV heparin. Baseline CBC is WNL and she is not on anticoagulation PTA. Planning myoview vs cath tomorrow.   Goal of Therapy:  Heparin level 0.3-0.7 units/ml Monitor platelets by anticoagulation protocol: Yes   Plan:  Heparin bolus 4000 units IV x 1 Heparin gtt 900 units/hr Check a 6 hr heparin level Daily heparin level and CBC  Thos Matsumoto, Drake Leach 12/11/2016,9:19 PM
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
## 2016-12-11 NOTE — ED Triage Notes (Signed)
Pt presents for evaluation of central chest pain without radiation that started around 1100 today. Pt given 324 ASA and 3 nitro with no improvement. Pt reports some inner ear issues and felt like she was going to fall and became upset when the pain started.
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
## 2016-12-11 NOTE — ED Notes (Signed)
Pt received  zofran ODT PTA, nausea resolved.

## 2016-12-11 NOTE — ED Provider Notes (Signed)
Patient developed anterior chest pressure today approximately 12:30 PM. Accompanied by sweatiness. Brought by EMS. Treated with sublingual nitroglycerin 3 tablets in the field and 4 baby aspirin's. Discomfort is mild at present. Supplemental nitroglycerin significantly alleviated her discomfort. On exam lungs clear auscultation heart regular rate and rhythm abdomen obese, nontender extremities without edema   Doug Sou, MD 12/11/16 [phone]

## 2016-12-12 ENCOUNTER — Observation Stay (HOSPITAL_BASED_OUTPATIENT_CLINIC_OR_DEPARTMENT_OTHER): Payer: PPO

## 2016-12-12 DIAGNOSIS — Z9861 Coronary angioplasty status: Secondary | ICD-10-CM

## 2016-12-12 DIAGNOSIS — R079 Chest pain, unspecified: Secondary | ICD-10-CM | POA: Diagnosis not present

## 2016-12-12 DIAGNOSIS — R42 Dizziness and giddiness: Secondary | ICD-10-CM

## 2016-12-12 DIAGNOSIS — E118 Type 2 diabetes mellitus with unspecified complications: Secondary | ICD-10-CM | POA: Diagnosis not present

## 2016-12-12 DIAGNOSIS — Z9989 Dependence on other enabling machines and devices: Secondary | ICD-10-CM

## 2016-12-12 DIAGNOSIS — I2510 Atherosclerotic heart disease of native coronary artery without angina pectoris: Secondary | ICD-10-CM | POA: Diagnosis not present

## 2016-12-12 DIAGNOSIS — G4733 Obstructive sleep apnea (adult) (pediatric): Secondary | ICD-10-CM | POA: Diagnosis not present

## 2016-12-12 DIAGNOSIS — I10 Essential (primary) hypertension: Secondary | ICD-10-CM | POA: Diagnosis not present

## 2016-12-12 LAB — CBC
HCT: 36.4 % (ref 36.0–46.0)
HEMOGLOBIN: 11.5 g/dL — AB (ref 12.0–15.0)
MCH: 24.7 pg — ABNORMAL LOW (ref 26.0–34.0)
MCHC: 31.6 g/dL (ref 30.0–36.0)
MCV: 78.3 fL (ref 78.0–100.0)
Platelets: 292 10*3/uL (ref 150–400)
RBC: 4.65 MIL/uL (ref 3.87–5.11)
RDW: 14.7 % (ref 11.5–15.5)
WBC: 10.3 10*3/uL (ref 4.0–10.5)

## 2016-12-12 LAB — BASIC METABOLIC PANEL
Anion gap: 10 (ref 5–15)
BUN: 13 mg/dL (ref 6–20)
CALCIUM: 9.4 mg/dL (ref 8.9–10.3)
CO2: 22 mmol/L (ref 22–32)
Chloride: 107 mmol/L (ref 101–111)
Creatinine, Ser: 0.76 mg/dL (ref 0.44–1.00)
GFR calc Af Amer: >60 mL/min (ref >60)
GLUCOSE: 123 mg/dL — AB (ref 65–99)
Potassium: 3.9 mmol/L (ref 3.5–5.1)
Sodium: 139 mmol/L (ref 135–145)

## 2016-12-12 LAB — LIPID PANEL
CHOL/HDL RATIO: 4.8 ratio
CHOLESTEROL: 140 mg/dL (ref 0–200)
HDL: 29 mg/dL — AB (ref >40)
LDL CALC: 69 mg/dL (ref 0–99)
TRIGLYCERIDES: 211 mg/dL — AB (ref <150)
VLDL: 42 mg/dL — AB (ref 0–40)

## 2016-12-12 LAB — TROPONIN I
Troponin I: <0.03 ng/mL (ref <0.03)
Troponin I: <0.03 ng/mL (ref <0.03)

## 2016-12-12 LAB — HEPARIN LEVEL (UNFRACTIONATED): HEPARIN UNFRACTIONATED: 0.38 [IU]/mL (ref 0.30–0.70)

## 2016-12-12 MED ORDER — TECHNETIUM TC 99M TETROFOSMIN IV KIT
10.0000 | PACK | Freq: Once | INTRAVENOUS | Status: AC
  Administered 2016-12-12: 10 via INTRAVENOUS

## 2016-12-12 MED ORDER — NITROGLYCERIN 0.4 MG SL SUBL: 0.4000 mg | SUBLINGUAL_TABLET | SUBLINGUAL | 3 refills | Status: DC | PRN

## 2016-12-12 MED ORDER — SIMVASTATIN 40 MG PO TABS: 40.0000 mg | ORAL_TABLET | ORAL | Status: DC

## 2016-12-12 MED ORDER — REGADENOSON 0.4 MG/5ML IV SOLN
INTRAVENOUS | Status: AC
  Administered 2016-12-12: 0.4 mg via INTRAVENOUS
  Filled 2016-12-12: qty 5

## 2016-12-12 MED ORDER — TECHNETIUM TC 99M TETROFOSMIN IV KIT
30.0000 | PACK | Freq: Once | INTRAVENOUS | Status: AC
  Administered 2016-12-12: 30 via INTRAVENOUS

## 2016-12-12 MED ORDER — TELMISARTAN 40 MG PO TABS: 40.0000 mg | ORAL_TABLET | Freq: Every morning | ORAL | 3 refills | Status: DC

## 2016-12-12 MED ORDER — REGADENOSON 0.4 MG/5ML IV SOLN
0.4000 mg | Freq: Once | INTRAVENOUS | Status: AC
  Administered 2016-12-12: 0.4 mg via INTRAVENOUS
  Filled 2016-12-12: qty 5
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
## 2016-12-12 NOTE — Progress Notes (Signed)
ANTICOAGULATION CONSULT NOTE  Pharmacy Consult for heparin Indication: chest pain/ACS  Allergies  Allergen Reactions  . Niacin And Related Other (See Comments)    Whelps and skin flushed, mouth tingling  . Tolectin [Tolmetin] Rash    Patient Measurements: Height:  (154.9 cm) Weight: 214 lb 8.1 oz (97.3 kg) IBW/kg (Calculated) : 47.8 Heparin Dosing Weight: 71kg  Vital Signs: BP: 128/42 (09/20 0330) Pulse Rate: 68 (09/20 0330)  Labs:  Recent Labs  12/11/16 1634 12/11/16 2144 12/12/16 0318  HGB 12.2  --  11.5*  HCT 38.6  --  36.4  PLT 324  --  292  HEPARINUNFRC  --   --  0.38  CREATININE 0.78  --   --   TROPONINI  --  <0.03  --     Estimated Creatinine Clearance: 72.8 mL/min (by C-G formula based on SCr of 0.78 mg/dL).  Assessment: 66 y.o. female with chest pain for heparin    Goal of Therapy:  Heparin level 0.3-0.7 units/ml Monitor platelets by anticoagulation protocol: Yes   Plan:  Continue Heparin at current rate for now  Krisann Mckenna, [employer] 12/12/2016,4:13 AM

## 2016-12-12 NOTE — Progress Notes (Signed)
Pt transferred to 4e from [facility] ED. Pt oriented to room and staff. Telemetry box applied and CCMD notified. Vitals obtained. Husband at bedside. Pt transporting to stress test. Will continue current plan of care.  Berdine Dance BSN, RN
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
## 2016-12-12 NOTE — Progress Notes (Signed)
Pt discharged home with husband. IV and telemetry box removed. Pt received discharge instructions and all questions were answered. Pt left with all of her belongings. Pt discharged via wheelchair and was accompanied by this RN and pt's daughter.   Berdine Dance BSN, RN

## 2016-12-17 ENCOUNTER — Encounter: Payer: Self-pay | Admitting: Family Medicine

## 2016-12-18 ENCOUNTER — Telehealth: Payer: Self-pay | Admitting: Family Medicine

## 2016-12-18 NOTE — Telephone Encounter (Signed)
Pt called in with the concern of having vertigo. Pt says that she isn't experiencing any other symptoms. Offered pt an apt today. Pt says that she is unable to come in to be seen today.   Transferred call to team health for triaging. Pt hung up.

## 2016-12-18 NOTE — Telephone Encounter (Signed)
Patient Name: [name] DOB: [birth year] Initial Comment Pt is complaining of dizziness. Nurse Assessment Nurse: Judee Clara, RN, [place] Date/Time (Eastern Time): 12/18/2016 10:44:48 AM Confirm and document reason for call. If symptomatic, describe symptoms. ---Patient has been having dizziness for some time. It has gotten worse for the last two weeks. OTC meds are helping for about two hours then caller is back to not being able to bend over without being dizzy. Caller does not want to be triaged. She is waiting for the office to call her back for an appointment. Does the patient have any new or worsening symptoms? ---No Please document clinical information provided and list any resource used. ---Patient states no triage, she has an appt on Monday. Guidelines Guideline Title Affirmed Question Affirmed Notes Final Disposition User Clinical Call Corum, RN, [employer]
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
## 2016-12-23 ENCOUNTER — Ambulatory Visit (INDEPENDENT_AMBULATORY_CARE_PROVIDER_SITE_OTHER): Payer: PPO | Admitting: Family Medicine

## 2016-12-23 VITALS — BP 122/62 | HR 65 | Temp 98.1°F | Ht 61.0 in

## 2016-12-23 DIAGNOSIS — I10 Essential (primary) hypertension: Secondary | ICD-10-CM

## 2016-12-23 DIAGNOSIS — E119 Type 2 diabetes mellitus without complications: Secondary | ICD-10-CM | POA: Diagnosis not present

## 2016-12-23 DIAGNOSIS — F411 Generalized anxiety disorder: Secondary | ICD-10-CM

## 2016-12-23 DIAGNOSIS — S0990XD Unspecified injury of head, subsequent encounter: Secondary | ICD-10-CM

## 2016-12-23 DIAGNOSIS — R413 Other amnesia: Secondary | ICD-10-CM | POA: Diagnosis not present

## 2016-12-23 MED ORDER — DIAZEPAM 2 MG PO TABS: ORAL_TABLET | ORAL | 1 refills | Status: DC

## 2016-12-23 MED ORDER — FLUOXETINE HCL 20 MG PO TABS: 20.0000 mg | ORAL_TABLET | Freq: Every day | ORAL | 3 refills | Status: DC

## 2016-12-23 MED FILL — FLUoxetine HCL 20 MG CAPS: 20 | 30 days supply | Qty: 30 | Fill #0 | Status: TO

## 2016-12-23 MED FILL — diazePAM 2 MG TABS: 2 | 20 days supply | Qty: 40 | Fill #0

## 2016-12-23 NOTE — Patient Instructions (Addendum)
It was good to see you today-  I am sorry that you are having such a hard time!  The head injury you sustained when you fell may have left you with a concussion.  It is imprtant that you get enough sleep, and that you rest your mind. We are going to try and get your anxiety under better control; use the valium before bed to help with sleep, and you can take 1/2 or 1 during the day if needed as well We are going to start you on prozac 20 mg for anxiety as well- take this once a day I am going to set up an MRI of your head and will also refer you to neurology Let me know if any worsening or change of your symptoms in the meantime!

## 2016-12-24 MED FILL — NITROGLYCERIN 0.4 MG TAB SL: 0.4 | 5 days supply | Qty: 25 | Fill #0

## 2016-12-26 ENCOUNTER — Encounter: Payer: Self-pay | Admitting: Cardiology

## 2016-12-26 ENCOUNTER — Ambulatory Visit (INDEPENDENT_AMBULATORY_CARE_PROVIDER_SITE_OTHER): Payer: PPO | Admitting: Cardiology

## 2016-12-26 DIAGNOSIS — I2510 Atherosclerotic heart disease of native coronary artery without angina pectoris: Secondary | ICD-10-CM | POA: Diagnosis not present

## 2016-12-26 DIAGNOSIS — I10 Essential (primary) hypertension: Secondary | ICD-10-CM

## 2016-12-26 DIAGNOSIS — R079 Chest pain, unspecified: Secondary | ICD-10-CM

## 2016-12-26 DIAGNOSIS — E785 Hyperlipidemia, unspecified: Secondary | ICD-10-CM | POA: Diagnosis not present

## 2016-12-26 DIAGNOSIS — E118 Type 2 diabetes mellitus with unspecified complications: Secondary | ICD-10-CM

## 2016-12-26 DIAGNOSIS — Z9861 Coronary angioplasty status: Secondary | ICD-10-CM | POA: Diagnosis not present

## 2016-12-26 DIAGNOSIS — R42 Dizziness and giddiness: Secondary | ICD-10-CM

## 2016-12-26 NOTE — Assessment & Plan Note (Signed)
?   Post concussion syndrome- w/u pending

## 2016-12-26 NOTE — Patient Instructions (Signed)
Medication Instructions: Your physician recommends that you continue on your current medications as directed. Please refer to the Current Medication list given to you today.  If you need a refill on your cardiac medications before your next appointment, please call your pharmacy.   Follow-Up: Your physician wants you to follow-up in: 6 months with Dr. Hilty. You will receive a reminder letter in the mail two months in advance. If you don't receive a letter, please call our office at [phone] to schedule this follow-up appointment.    Thank you for choosing [facility]!!      

## 2016-12-26 NOTE — Progress Notes (Signed)
12/26/2016 [name]   02-11-1951  [id number]  Primary Physician Copland, [name], MD Primary Cardiologist: Dr Herbie Baltimore  HPI:  66 y.o.obesefemalewith a history of CAD, s/p DES to prox. LAD in 2010 with residual disease in LCX 10-20% and RCA 20%. Cath in 2014 showed a patent stent. Other problems included HTN, DM, HLD, anxiety, and obesity. She fell in June and had to have Rt knee surgery. She has struggled since then. She has significant anxiety. She also complains of dizziness which has limited her physical therapy. She presented to the hospital with chest pain 12/11/16 and ruled out for an MI. She had been under stress after her recent knee surgery and concern for her son who is isolated by flood waters in [place] after hurricane [place]. She ruled out for an MI. Myoview done 12/12/16 was low risk. She was reassured and discharged. She is in the office today for follow up. She saw her PCP 12/23/16 who has ordered an MRI and suggested a neurologic work up for her persistent dizziness. She was also placed on an antidepressant. The pt denies any further chest pain.   Current Outpatient Prescriptions  Medication Sig Dispense Refill  . acetaminophen (TYLENOL) 500 MG tablet Take 500 mg by mouth every 6 (six) hours as needed for mild pain or headache.    Marland Kitchen aspirin EC 81 MG EC tablet Take 1 tablet (81 mg total) by mouth daily. 30 tablet 1  . calcium carbonate (OS-CAL) 1250 (500 Ca) MG chewable tablet Chew 1 tablet by mouth daily.    . cetirizine (ZYRTEC) 10 MG chewable tablet Chew 10 mg by mouth daily as needed for allergies.    . cholecalciferol (VITAMIN D) 1000 UNITS tablet Take 1,000 Units by mouth daily.    . clopidogrel (PLAVIX) 75 MG tablet TAKE 1 TABLET BY MOUTH DAILY. 90 tablet 1  . diazepam (VALIUM) 2 MG tablet TAKE 1 TABLET BY MOUTH EVERY 12 hours as needed for anxiety 40 tablet 1  . famotidine (PEPCID) 20 MG tablet TAKE 1 TABLET BY MOUTH 2 TIMES DAILY. 180 tablet 2  . FLUoxetine  (PROZAC) 20 MG tablet Take 1 tablet (20 mg total) by mouth daily. 30 tablet 3  . fluticasone (FLONASE) 50 MCG/ACT nasal spray Place 1 spray into both nostrils daily as needed for allergies or rhinitis.    . metFORMIN (GLUCOPHAGE-XR) 500 MG 24 hr tablet Take 1 tablet (500 mg total) by mouth daily with breakfast. 90 tablet 3  . metoprolol succinate (TOPROL-XL) 25 MG 24 hr tablet TAKE 1/2 TABLET BY MOUTH DAILY 45 tablet 3  . Multiple Vitamin (MULTIVITAMIN WITH MINERALS) TABS tablet Take 1 tablet by mouth daily.    . nitroGLYCERIN (NITROSTAT) 0.4 MG SL tablet Place 1 tablet (0.4 mg total) under the tongue every 5 (five) minutes x 3 doses as needed for chest pain. 25 tablet 3  . simvastatin (ZOCOR) 40 MG tablet Take 1 tablet (40 mg total) by mouth every morning.    Marland Kitchen telmisartan (MICARDIS) 40 MG tablet Take 1 tablet (40 mg total) by mouth every morning. 90 tablet 3   No current facility-administered medications for this visit.     Allergies  Allergen Reactions  . Niacin And Related Other (See Comments)    Whelps and skin flushed, mouth tingling  . Tolectin [Tolmetin] Rash    Past Medical History:  Diagnosis Date  . Anemia   . Anginal pain (HCC)    pt relates to stress pt  was seen by Dr Rennis Golden 3 wks ago was given metoprolol   . Bronchitis    hx of   . CAD (coronary artery disease)    PCI to LAD in 2010, mild residual disease in LCX and RCA  . Cataracts, bilateral   . Diabetes mellitus without complication (HCC)   . Family history of adverse reaction to anesthesia    pts mother had difficulty awakening   . GERD (gastroesophageal reflux disease)   . Hyperlipidemia LDL goal < 70   . Hypertension   . Lumbar back pain   . Numbness    left leg and foot   . Plantar fascia rupture    Left Foot  . Sleep apnea    uses CPAP     Social History   Social History  . Marital status: Married    Spouse name: [name]  . Number of children: 2  . Years of education: MSN   Occupational History    . [employer]   Social History Main Topics  . Smoking status: Never Smoker  . Smokeless tobacco: Never Used  . Alcohol use Yes     Comment: occasional, 1 a month wine  . Drug use: No  . Sexual activity: Yes   Other Topics Concern  . Not on file   Social History Narrative      Married. Education: [employer]. Exercise: Yes   Patient lives at home with her spouse.   Caffeine use: 1 drink of tea a day     Family History  Problem Relation Age of Onset  . Coronary artery disease Mother   . Rheum arthritis Mother   . Dementia Mother   . Heart attack Father   . Hypertension Father   . Hyperlipidemia Father   . Other Father        MVA  . Hypertension Brother   . Cancer Brother   . Heart disease Brother   . Cancer Paternal Grandmother        stomach  . Diabetes Paternal Grandfather   . Breast cancer Neg Hx      Review of Systems: General: negative for chills, fever, night sweats or weight changes.  Cardiovascular: negative for chest pain, dyspnea on exertion, edema, orthopnea, palpitations, paroxysmal nocturnal dyspnea or shortness of breath Dermatological: negative for rash Respiratory: negative for cough or wheezing Urologic: negative for hematuria Abdominal: negative for nausea, vomiting, diarrhea, bright red blood per rectum, melena, or hematemesis Neurologic: negative for visual changes, syncope, or dizziness All other systems reviewed and are otherwise negative except as noted above.    Blood pressure (!) 147/62, pulse 72, height  (1.549 m), weight 216 lb (98 kg), SpO2 94 %.  General appearance: alert, cooperative, no distress, moderately obese and in wheel chair Neck: no carotid bruit and no JVD Lungs: clear to auscultation bilaterally Heart: regular rate and rhythm Extremities: extremities normal, atraumatic, no cyanosis or edema Skin: Skin color, texture, turgor normal. No rashes or lesions Neurologic: Grossly normal   ASSESSMENT AND PLAN:    Chest pain with moderate risk of acute coronary syndrome MI r/o, Myoview low risk Sept 2018  CAD S/P percutaneous coronary angioplasty PCI to LAD in 2010, mild residual disease in LCX and RCA Cath July 2014 for chest pain- patent stent  Essential hypertension Controlled  Dizziness ? Post concussion syndrome- w/u pending  DM (diabetes mellitus) with complications (HCC) Type 2 NIDDM  Hyperlipidemia with target LDL less than 70 LDL 69 12/12/16  PLAN  I reassured the pt and her husband that her stress was OK and that we felt it unlikley her symptoms of chest pain were secondary to CAD progression. Dr Herbie Baltimore will see her in 6 months.   Corine Shelter PA-C 12/26/2016 11:34 AM

## 2016-12-26 NOTE — Assessment & Plan Note (Signed)
PCI to LAD in 2010, mild residual disease in LCX and RCA Cath July 2014 for chest pain- patent stent

## 2016-12-26 NOTE — Assessment & Plan Note (Signed)
Controlled.  

## 2016-12-26 NOTE — Assessment & Plan Note (Signed)
MI r/o, Myoview low risk Sept 2018

## 2016-12-26 NOTE — Assessment & Plan Note (Signed)
Type 2 NIDDM 

## 2016-12-26 NOTE — Assessment & Plan Note (Signed)
LDL 69 12/12/16

## 2016-12-27 ENCOUNTER — Encounter: Payer: Self-pay | Admitting: Family Medicine

## 2016-12-27 MED FILL — SIMVASTATIN 40 MG TABLET: 40 | 90 days supply | Qty: 90 | Fill #1

## 2016-12-28 ENCOUNTER — Ambulatory Visit (HOSPITAL_BASED_OUTPATIENT_CLINIC_OR_DEPARTMENT_OTHER)
Admission: RE | Admit: 2016-12-28 | Discharge: 2016-12-28 | Disposition: A | Discharge status: Home / Self Care | Payer: PPO | Source: Ambulatory Visit | Attending: Family Medicine | Admitting: Family Medicine

## 2016-12-28 DIAGNOSIS — X58XXXD Exposure to other specified factors, subsequent encounter: Secondary | ICD-10-CM | POA: Diagnosis not present

## 2016-12-28 DIAGNOSIS — S0990XA Unspecified injury of head, initial encounter: Secondary | ICD-10-CM | POA: Diagnosis not present

## 2016-12-28 DIAGNOSIS — R413 Other amnesia: Secondary | ICD-10-CM

## 2016-12-28 DIAGNOSIS — S0990XD Unspecified injury of head, subsequent encounter: Secondary | ICD-10-CM

## 2017-01-01 ENCOUNTER — Encounter: Payer: Self-pay | Admitting: Family Medicine

## 2017-01-08 ENCOUNTER — Encounter: Payer: Self-pay | Admitting: Diagnostic Neuroimaging

## 2017-01-08 ENCOUNTER — Ambulatory Visit (INDEPENDENT_AMBULATORY_CARE_PROVIDER_SITE_OTHER): Payer: PPO | Admitting: Diagnostic Neuroimaging

## 2017-01-08 VITALS — BP 151/74 | HR 68 | Ht 61.0 in | Wt 215.0 lb

## 2017-01-08 DIAGNOSIS — F0781 Postconcussional syndrome: Secondary | ICD-10-CM | POA: Diagnosis not present

## 2017-01-08 NOTE — Patient Instructions (Signed)

## 2017-01-08 NOTE — Progress Notes (Signed)
[facility]  PATIENT: [name] DOB: 1951/03/12  REFERRING CLINICIAN: Copeland HISTORY FROM: patient  REASON FOR VISIT: follow up   HISTORICAL  CHIEF COMPLAINT:  Chief Complaint  Patient presents with  . Injury of head    rm 7, dgtrElnita Maxwell, brother-in-law- [place], MRI 12/28/16, "nauseated, dizzy, lightheaded"    HISTORY OF PRESENT ILLNESS:   UPDATE (01/08/17, VRP): Since last visit, was doing well until Aug 15, 2016 --> fell and hit head and broke right knee. Now with headaches, anxiety, nausea issues. Now on prozac and feeling a little better.  UPDATE 09/09/14: Since last visit, doing better with memory and sleep apnea treatments. Having more issues with left foot/toe drop and back pain --> has MRI lumbar spine setup via ortho for reevaluation of known lumbar radiculopathy.   UPDATE 03/01/14: Since last visit, feeling better. Still with some stress, memory loss, but getting better. Had sleep study and dx'd with sleep apnea, planning to have titration study in Jan 2016 for CPAP.  PRIOR HPI (12/20/13): 66 year old right-handed female here for a list of memory loss. Patient noticed memory problems since 02/06/2013. Husband noticed problems since February 2015. Patient having short-term memory problems, poor concentration attention, in the setting of significantly increased psychosocial stressors. She has had more trouble driving and cooking recently.  Patient was taking care of her brother for the past one year who was diagnosed with pancreatic cancer. He deteriorated and ultimately passed away in 02/06/14. Patient placed some blame on herself for his death, related to recommending morphine drip. During his funeral, patient stepped into a hole in the ground and developed left foot, ankle injury and plantar fascia rupture. Related to this patient was unable to work. She had to have premature retirement. This contributed to increasing stress, depression and  anxiety. Patient also had a fall at the beats with head trauma, low back pain and disc bulging. Patient had lumbar spine surgery, L4, L5, S1 microdecompression 2014. Patient has family history of dementia in her mother and father.    REVIEW OF SYSTEMS: Full 14 system review of systems performed and negative except: memory loss agitation anxiety dizziness blured vision cramps freq waking.    ALLERGIES: Allergies  Allergen Reactions  . Niacin And Related Other (See Comments)    Whelps and skin flushed, mouth tingling  . Tolectin [Tolmetin] Rash    HOME MEDICATIONS: Outpatient Medications Prior to Visit  Medication Sig Dispense Refill  . acetaminophen (TYLENOL) 500 MG tablet Take 500 mg by mouth every 6 (six) hours as needed for mild pain or headache.    Marland Kitchen aspirin EC 81 MG EC tablet Take 1 tablet (81 mg total) by mouth daily. 30 tablet 1  . calcium carbonate (OS-CAL) 1250 (500 Ca) MG chewable tablet Chew 1 tablet by mouth daily.    . cetirizine (ZYRTEC) 10 MG chewable tablet Chew 10 mg by mouth daily as needed for allergies.    . cholecalciferol (VITAMIN D) 1000 UNITS tablet Take 1,000 Units by mouth daily.    . clopidogrel (PLAVIX) 75 MG tablet TAKE 1 TABLET BY MOUTH DAILY. 90 tablet 1  . diazepam (VALIUM) 2 MG tablet TAKE 1 TABLET BY MOUTH EVERY 12 hours as needed for anxiety 40 tablet 1  . famotidine (PEPCID) 20 MG tablet TAKE 1 TABLET BY MOUTH 2 TIMES DAILY. 180 tablet 2  . FLUoxetine (PROZAC) 20 MG tablet Take 1 tablet (20 mg total) by mouth daily. 30 tablet 3  . fluticasone (  FLONASE) 50 MCG/ACT nasal spray Place 1 spray into both nostrils daily as needed for allergies or rhinitis.    . metFORMIN (GLUCOPHAGE-XR) 500 MG 24 hr tablet Take 1 tablet (500 mg total) by mouth daily with breakfast. 90 tablet 3  . metoprolol succinate (TOPROL-XL) 25 MG 24 hr tablet TAKE 1/2 TABLET BY MOUTH DAILY 45 tablet 3  . Multiple Vitamin (MULTIVITAMIN WITH MINERALS) TABS tablet Take 1 tablet by mouth  daily.    . nitroGLYCERIN (NITROSTAT) 0.4 MG SL tablet Place 1 tablet (0.4 mg total) under the tongue every 5 (five) minutes x 3 doses as needed for chest pain. 25 tablet 3  . simvastatin (ZOCOR) 40 MG tablet Take 1 tablet (40 mg total) by mouth every morning.    Marland Kitchen telmisartan (MICARDIS) 40 MG tablet Take 1 tablet (40 mg total) by mouth every morning. 90 tablet 3   No facility-administered medications prior to visit.     PAST MEDICAL HISTORY: Past Medical History:  Diagnosis Date  . Anemia   . Anginal pain (HCC)    pt relates to stress pt was seen by Dr Rennis Golden 3 wks ago was given metoprolol   . Bronchitis    hx of   . CAD (coronary artery disease)    PCI to LAD in 2010, mild residual disease in LCX and RCA  . Cataracts, bilateral   . Diabetes mellitus without complication (HCC)   . Family history of adverse reaction to anesthesia    pts mother had difficulty awakening   . GERD (gastroesophageal reflux disease)   . Hyperlipidemia LDL goal < 70   . Hypertension   . Lumbar back pain   . Numbness    left leg and foot   . Plantar fascia rupture    Left Foot  . Sleep apnea    uses CPAP     PAST SURGICAL HISTORY: Past Surgical History:  Procedure Laterality Date  . ABDOMINAL HYSTERECTOMY    . BREAST CYST ASPIRATION  1995  . CAROTID STENT  2009   pt denies   . CORONARY ANGIOPLASTY WITH STENT PLACEMENT  2010and 10-02-2012   Stent DES, Xience to prox. LAD  . DOPPLER ECHOCARDIOGRAPHY  08/01/2009   EF=>55%,LV normal  . LEFT HEART CATHETERIZATION WITH CORONARY ANGIOGRAM N/A 10/02/2012   Procedure: LEFT HEART CATHETERIZATION WITH CORONARY ANGIOGRAM;  Surgeon: Runell Gess, MD;  [facility];  Service: Cardiovascular;  Laterality: N/A;  . lower arterial duplex  06/20/10   abi's normal,rgt 0.98,lft 1.06;bilateral PVRs normal  . LUMBAR LAMINECTOMY/DECOMPRESSION MICRODISCECTOMY Left 01/06/2013   Procedure: MICRO LUMBAR DECOMPRESSION L4-5 AND L5-S1;  Surgeon: Javier Docker,  MD;  Location: [facility] ORS;  Service: Orthopedics;  Laterality: Left;  . LUMBAR LAMINECTOMY/DECOMPRESSION MICRODISCECTOMY Left 10/12/2014   Procedure: REVISION MICRO LUMBAR/DECOMPRESSION L4-5 LEFT ;  Surgeon: Jene Every, MD;  Location: [facility] ORS;  Service: Orthopedics;  Laterality: Left;  . NM MYOCAR PERF WALL MOTION  09/22/2008   lexiscan-EF 83%; glogal LV systolic fx is norm. ,evidence of mild ischemia basal anterior,midanterior and apical lateral region(s).   . ORIF PATELLA Right 08/29/2016   Procedure: OPEN REDUCTION INTERNAL (ORIF) FIXATION RIGHT PATELLA;  Surgeon: Samson Frederic, MD;  Location: [facility];  Service: Orthopedics;  Laterality: Right;  Adductor Block  . TUBAL LIGATION    . TYMPANOPLASTY Bilateral   . UVULOPALATOPHARYNGOPLASTY     pt denies     FAMILY HISTORY: Family History  Problem Relation Age of Onset  . Coronary artery  disease Mother   . Rheum arthritis Mother   . Dementia Mother   . Heart attack Father   . Hypertension Father   . Hyperlipidemia Father   . Other Father        MVA  . Hypertension Brother   . Cancer Brother   . Heart disease Brother   . Cancer Paternal Grandmother        stomach  . Diabetes Paternal Grandfather   . Breast cancer Neg Hx     SOCIAL HISTORY:  Social History   Social History  . Marital status: Married    Spouse name: [name]  . Number of children: 2  . Years of education: MSN   Occupational History  . [employer]   Social History Main Topics  . Smoking status: Never Smoker  . Smokeless tobacco: Never Used  . Alcohol use Yes     Comment: occasional, 1 a month wine  . Drug use: No  . Sexual activity: Yes   Other Topics Concern  . Not on file   Social History Narrative      Married. Education: [employer]. Exercise: Yes   Patient lives at home with her spouse.   Caffeine use: 1 drink of tea a day     PHYSICAL EXAM  Vitals:   01/08/17 1252  BP: (!) 151/74  Pulse: 68  Weight: 215 lb (97.5 kg)  Height:  5\' 1"  (1.549 m)    Not recorded      Body mass index is 40.62 kg/m.   MMSE - Mini Mental State Exam 12/20/2013  Orientation to time 5  Orientation to Place 5  Registration 3  Attention/ Calculation 5  Recall 3  Language- name 2 objects 2  Language- repeat 1  Language- follow 3 step command 3  Language- read & follow direction 1  Write a sentence 1  Copy design 1  Total score 30    Montreal Cognitive Assessment  09/09/2014 03/01/2014 12/20/2013  Visuospatial/ Executive (0/5) 3 2 2   Naming (0/3) 3 3 3   Attention: Read list of digits (0/2) 2 2 2   Attention: Read list of letters (0/1) 1 1 1   Attention: Serial 7 subtraction starting at 100 (0/3) 3 3 3   Language: Repeat phrase (0/2) 2 1 2   Language : Fluency (0/1) 1 1 0  Abstraction (0/2) 2 2 1   Delayed Recall (0/5) 2 3 4   Orientation (0/6) 6 6 6   Total 25 24 24   Adjusted Score (based on education) 25 24 24     GENERAL EXAM: Patient is in no distress; well developed, nourished and groomed; neck is supple  CARDIOVASCULAR: Regular rate and rhythm, no murmurs, no carotid bruits  NEUROLOGIC: MENTAL STATUS: awake, alert, language fluent, comprehension intact, naming intact, fund of knowledge appropriate; NO FRONTAL RELEASE SIGNS.   CRANIAL NERVE: no papilledema on fundoscopic exam, pupils equal and reactive to light, visual fields full to confrontation, extraocular muscles intact, no nystagmus, facial sensation and strength symmetric, hearing intact, palate elevates symmetrically, uvula midline, shoulder shrug symmetric, tongue midline. MOTOR: normal bulk and tone, full strength in the BUE, BLE SENSORY: normal and symmetric to light touch, temperature,  COORDINATION: finger-nose-finger, fine finger movements normal REFLEXES: deep tendon reflexes present and symmetric GAIT/STATION: narrow based gait; LIMPS ON RIGHT KNEE    DIAGNOSTIC DATA (LABS, IMAGING, TESTING) - I reviewed patient records, labs, notes, testing and imaging  myself where available.  Lab Results  Component Value Date   WBC 10.3 12/12/2016  HGB 11.5 (L) 12/12/2016   HCT 36.4 12/12/2016   MCV 78.3 12/12/2016   PLT 292 12/12/2016      Component Value Date/Time   NA 139 12/12/2016 0318   K 3.9 12/12/2016 0318   CL 107 12/12/2016 0318   CO2 22 12/12/2016 0318   GLUCOSE 123 (H) 12/12/2016 0318   BUN 13 12/12/2016 0318   CREATININE 0.76 12/12/2016 0318   CREATININE 0.82 09/29/2014 0910   CALCIUM 9.4 12/12/2016 0318   PROT 6.9 07/18/2016 0853   ALBUMIN 4.2 07/18/2016 0853   AST 11 07/18/2016 0853   ALT 17 07/18/2016 0853   ALKPHOS 88 07/18/2016 0853   BILITOT 0.4 07/18/2016 0853   GFRNONAA >60 12/12/2016 0318   GFRAA >60 12/12/2016 0318   Lab Results  Component Value Date   CHOL 140 12/12/2016   HDL 29 (L) 12/12/2016   LDLCALC 69 12/12/2016   LDLDIRECT 102.0 07/18/2016   TRIG 211 (H) 12/12/2016   CHOLHDL 4.8 12/12/2016   Lab Results  Component Value Date   HGBA1C 6.8 (H) 08/27/2016   Lab Results  Component Value Date   VITAMINB12 924 12/20/2013   Lab Results  Component Value Date   TSH 3.11 01/25/2016    I reviewed images myself and agree with interpretation. -VRP  12/07/12 CT head - Negative head CT.  12/07/12 CT cervical spine - No acute bony injury in the cervical spine.  12/29/13 MRI brain 1. Few subcortical and juxtacortical foci of non-specific gliosis, likely chronic small vessel ischemic disease.  2. No acute findings.  12/28/16 MRI brain  - No cause of the presenting symptoms is identified. No post traumatic finding. Normal except for a few punctate foci of T2 and FLAIR signal within the hemispheric white matter, probably subclinical.     ASSESSMENT AND PLAN  66 y.o. year old female here with short term memory loss, poor attention/focus, since Nov 2014, in setting of increased personal stress (brother's illness and death, left foot injury, inability to work and premature retirement).   Then had fall  and head injury in May 2018, with mild concussion and now post-concussion syndrome.    Dx:   1. Post concussion syndrome      PLAN:  I spent 25 minutes of face to face time with patient. Greater than 50% of time was spent in counseling and coordination of care with patient. In summary we discussed:   - gradually increase activity - reviewed nutrition, fitness, sleep and relaxation techniques  Return if symptoms worsen or fail to improve, for return to PCP.     Suanne MarkerVIKRAM R. Ivor Kishi, MD 01/08/2017, 1:20 PM Certified in Neurology, Neurophysiology and Neuroimaging  [facility] [address], [address] [place], [place] [address] [phone]

## 2017-01-10 MED FILL — TELMISARTAN 40 MG TABLET: 40 | 90 days supply | Qty: 90 | Fill #2

## 2017-01-10 MED FILL — DICLOFENAC SODIUM 1% GEL: 1 | 50 days supply | Qty: 300 | Fill #1

## 2017-01-16 MED FILL — FLUoxetine HCL 20 MG CAPS: 20 | 30 days supply | Qty: 30 | Fill #0

## 2017-01-28 ENCOUNTER — Encounter: Payer: Self-pay | Admitting: Family Medicine

## 2017-01-29 ENCOUNTER — Ambulatory Visit: Payer: PPO | Admitting: Podiatry

## 2017-01-29 NOTE — Telephone Encounter (Signed)
Patient is calling because patient daughter tested positive again for e coli and c diff 11/05/8. Patient would like to have a stool test done for her and her husband. Patient very concerned since this is the second time patient daughter has tested positive. Please call patient and advise.

## 2017-01-30 NOTE — Telephone Encounter (Signed)
Pt called in to follow up on request. She said that she really need to have test because her daughter test positive.   Please call back to assist further.    Please advise further.

## 2017-02-07 MED FILL — METOPROLOL SUCC ER 25 MG TA: 25 | 90 days supply | Qty: 45 | Fill #1

## 2017-02-20 DIAGNOSIS — Z961 Presence of intraocular lens: Secondary | ICD-10-CM | POA: Diagnosis not present

## 2017-02-20 DIAGNOSIS — E119 Type 2 diabetes mellitus without complications: Secondary | ICD-10-CM | POA: Diagnosis not present

## 2017-02-20 DIAGNOSIS — Z7984 Long term (current) use of oral hypoglycemic drugs: Secondary | ICD-10-CM | POA: Diagnosis not present

## 2017-02-21 MED FILL — FLUoxetine HCL 20 MG CAPS: 20 | 30 days supply | Qty: 30 | Fill #1

## 2017-02-26 ENCOUNTER — Other Ambulatory Visit: Payer: Self-pay | Admitting: Internal Medicine

## 2017-02-26 NOTE — Telephone Encounter (Signed)
Rx(s) sent to pharmacy electronically.  

## 2017-03-07 MED FILL — CLOPIDOGREL 75 MG TABLET: 75 | 90 days supply | Qty: 90 | Fill #0

## 2017-03-10 MED FILL — METFORMIN HCL ER 500 MG TAB: 500 | 90 days supply | Qty: 90 | Fill #1

## 2017-03-12 DIAGNOSIS — S82091D Other fracture of right patella, subsequent encounter for closed fracture with routine healing: Secondary | ICD-10-CM | POA: Diagnosis not present

## 2017-03-26 MED FILL — FLUoxetine HCL 20 MG CAPS: 20 | 30 days supply | Qty: 30 | Fill #2

## 2017-03-26 MED FILL — FAMOTIDINE 20 MG TABLET: 20 | 90 days supply | Qty: 180 | Fill #2

## 2017-03-26 MED FILL — SIMVASTATIN 40 MG TABLET: 40 | 90 days supply | Qty: 90 | Fill #2

## 2017-03-27 DIAGNOSIS — M25661 Stiffness of right knee, not elsewhere classified: Secondary | ICD-10-CM | POA: Diagnosis not present

## 2017-04-03 DIAGNOSIS — M25661 Stiffness of right knee, not elsewhere classified: Secondary | ICD-10-CM | POA: Diagnosis not present

## 2017-04-08 DIAGNOSIS — M25661 Stiffness of right knee, not elsewhere classified: Secondary | ICD-10-CM | POA: Diagnosis not present

## 2017-04-11 DIAGNOSIS — M25661 Stiffness of right knee, not elsewhere classified: Secondary | ICD-10-CM | POA: Diagnosis not present

## 2017-04-15 DIAGNOSIS — M25661 Stiffness of right knee, not elsewhere classified: Secondary | ICD-10-CM | POA: Diagnosis not present

## 2017-04-17 ENCOUNTER — Ambulatory Visit: Payer: Self-pay | Admitting: *Deleted

## 2017-04-17 MED FILL — TELMISARTAN 40 MG TABLET: 40 | 90 days supply | Qty: 90 | Fill #3

## 2017-04-17 NOTE — Telephone Encounter (Signed)
Called in c/o a dull constant headache that has been going on for 3 days.  She is using Tylenol but it's not helping.  She denies vision changes, stiff neck.  "I have checked my BP and it's normal". She did mention she fell back on May 24th and hit her head on a door after tripping over a dogrunner.   She also broke her kneecap and had surgery on it.   She is presently doing PT for that.  She has concussion syndrome as a result of the fall where she hit her head according to the neurologist and her MRI. She is on Plavix because she had stents put in by the cardiologist.   She has an appt with Dr. Patsy Lageropland on Monday 04/21/17 at 10:15am.   I instructed her to call us back if her symptoms became worse or changed.  If it was over the weekend to go to the ED.    She verbalized understanding and would do the above if her symptoms changed or became worse.   Reason for Disposition . [1] MILD-MODERATE headache AND [2] present > 72 hours  Answer Assessment - Initial Assessment Questions 1. LOCATION: "Where does it hurt?"      Dull headache all over that has been there for 3 days.   My neck is not stiff. 2. ONSET: "When did the headache start?" (Minutes, hours or days)      3 days. 3. PATTERN: "Does the pain come and go, or has it been constant since it started?"     Constant dull headache.   I can sleep is only releif.  I have concussion syndrom from a fall I took on May 24th. 4. SEVERITY: "How bad is the pain?" and "What does it keep you from doing?"  (e.g., Scale 1-10; mild, moderate, or severe)   - MILD (1-3): doesn't interfere with normal activities    - MODERATE (4-7): interferes with normal activities or awakens from sleep    - SEVERE (8-10): excruciating pain, unable to do any normal activities        4.  It's just frustrating because I can't get it to go away. 5. RECURRENT SYMPTOM: "Have you ever had headaches before?" If so, ask: "When was the last time?" and "What happened that time?"      It  usually comes and goes but this time I've had it constantly for the last 3 days. 6. CAUSE: "What do you think is causing the headache?"     The concussion syndrome. 7. MIGRAINE: "Have you been diagnosed with migraine headaches?" If so, ask: "Is this headache similar?"      I had one long, long time ago I had a cluster headache [facility]  They knocked me out for the night. 8. HEAD INJURY: "Has there been any recent injury to the head?"      May 24th I fell into a glass door after I tripped over a dogrunner. 9. OTHER SYMPTOMS: "Do you have any other symptoms?" (fever, stiff neck, eye pain, sore throat, cold symptoms)     No other symptoms.  No cold symptoms. 10. PREGNANCY: "Is there any chance you are pregnant?" "When was your last menstrual period?"       Not asked  Protocols used: HEADACHE-A-AH

## 2017-04-19 NOTE — Progress Notes (Addendum)
[facility] [address] [place], [place] [address] [phone]  Date:  04/21/2017   Name:  [name]   DOB:  1950/12/15   MRN:  [id number]  PCP:  Pearline Cables, MD    Chief Complaint: Headache (c/o headache off and on since last week. )   History of Present Illness:  [name] is a 67 y.o. very pleasant female patient who presents with the following:  Here today for a follow-up visit History of OSA, DM, obesity, hyperlipidemia, CAD, HTN I last saw her in Jan 24, 2023, and she was admitted overnight in September for CP (she ruled out) From our visit in 01/24/2023: Here today to discuss a few concerns.   She has felt anxious and lightheaded/ dizzy for the last few months, seems to be escalating. She admits to being under tremendous stress recently between family concerns and her own health issues.  She would like to have an MRI of her head, and a referral to neurology regarding possible persistent concussion sx following her fall back in May  Ordered MRI and placed neurology referral Gave rx for valium for her to use as needed for severe anxiety.  Counseled her to stay off her phone if she wakes in the night- a somewhat boring book would be a better choice to sooth herself.  Will also start her on prozac for longer term anxiety and depression treatment  Her DM has been under ok control, and she is continuing zocor for her hyperlilpidemia  We have referred her to neurology for her post concussion sx- she is seeing Dr. Marjory Lies, he last saw her in 2023/01/24 A1c is due- will get for her today Pneumonia vaccine: she had her first pneumovax at age 13, then did have prevnar in 2016.  Can update pneumovax today- she would like to do so Pt notes that "last week I just didn't feel good," she went to PT last week and felt uncomfortable walking around on the floor like she might fall down. She is bothered by patterns in the carpet  at PT, it makes her feel confused and off balance.  She feels like her eyes are having difficulty focusing and are jumping around  [place] is with her today and contributes to the history  She feels like her eyes are "dry and bulging" Her eyes are sometimes bloodshot She feels like her sx are similar to what she had back in 24-Jan-2023, but perhaps more pronounced She admits to being stressed-  [name] Cliche states that she seems to be very worried about falling, she is feeling dizzy all the time and does not want to leave the house.  She will get SOB when she leaves the house  She wonders if she might be having panic attacks She will feel SOB with exertion but this is not new She had a low risk myoview in September  Pt notes that "I just don't feel good," she does not feel like she can drive She does not feel like she is depressed. She will hang onto the wall at home to help herself walk  She had right knee surgery from her patellar fracture in May when she fell on 5/24- this is also when she hit her had.  Her unsteadiness on her knee is contributing to her feeling off balance  At home she will use a cane some of the time.  She feels like she cannot walk independently  for more than a few steps   She is going to PT and this is working well for her, it does seem to be helping Offered to arrange home PT but they both feel like it is beneficial for her to get out  Walking around a store holding onto a cart is comforting to her  The sx she presents with today are not new per her report, but have been troubling her for several months.    Patient Active Problem List   Diagnosis Date Noted  . Dizziness 10/02/2016  . Right patella fracture 08/29/2016  . Acquired foot deformity, left 07/18/2016  . Osteopenia 11/10/2015  . HNP (herniated nucleus pulposus), lumbar 10/12/2014  . Spinal stenosis at L4-L5 level 10/12/2014  . OSA on CPAP 07/29/2014  . Iron deficiency anemia 07/29/2014  . DM (diabetes  mellitus) with complications (HCC) 07/29/2014  . Environmental and seasonal allergies 04/28/2014  . Spinal stenosis, lumbar region, with neurogenic claudication 01/06/2013  . Obesity, morbid, BMI 40.0-49.9 (HCC) 10/20/2012  . Chest pain with moderate risk of acute coronary syndrome 10/02/2012  . CAD S/P percutaneous coronary angioplasty   . Hyperlipidemia with target LDL less than 70   . Essential hypertension 04/16/2012    Past Medical History:  Diagnosis Date  . Anemia   . Anginal pain (HCC)    pt relates to stress pt was seen by Dr Rennis Golden 3 wks ago was given metoprolol   . Bronchitis    hx of   . CAD (coronary artery disease)    PCI to LAD in 2010, mild residual disease in LCX and RCA  . Cataracts, bilateral   . Diabetes mellitus without complication (HCC)   . Family history of adverse reaction to anesthesia    pts mother had difficulty awakening   . GERD (gastroesophageal reflux disease)   . Hyperlipidemia LDL goal < 70   . Hypertension   . Lumbar back pain   . Numbness    left leg and foot   . Plantar fascia rupture    Left Foot  . Sleep apnea    uses CPAP     Past Surgical History:  Procedure Laterality Date  . ABDOMINAL HYSTERECTOMY    . BREAST CYST ASPIRATION  1995  . CAROTID STENT  2009   pt denies   . CORONARY ANGIOPLASTY WITH STENT PLACEMENT  2010and 10-02-2012   Stent DES, Xience to prox. LAD  . DOPPLER ECHOCARDIOGRAPHY  08/01/2009   EF=>55%,LV normal  . LEFT HEART CATHETERIZATION WITH CORONARY ANGIOGRAM N/A 10/02/2012   Procedure: LEFT HEART CATHETERIZATION WITH CORONARY ANGIOGRAM;  Surgeon: Runell Gess, MD;  Location: [facility] CATH LAB;  Service: Cardiovascular;  Laterality: N/A;  . lower arterial duplex  06/20/10   abi's normal,rgt 0.98,lft 1.06;bilateral PVRs normal  . LUMBAR LAMINECTOMY/DECOMPRESSION MICRODISCECTOMY Left 01/06/2013   Procedure: MICRO LUMBAR DECOMPRESSION L4-5 AND L5-S1;  Surgeon: Javier Docker, MD;  Location: [facility] ORS;  Service:  Orthopedics;  Laterality: Left;  . LUMBAR LAMINECTOMY/DECOMPRESSION MICRODISCECTOMY Left 10/12/2014   Procedure: REVISION MICRO LUMBAR/DECOMPRESSION L4-5 LEFT ;  Surgeon: Jene Every, MD;  Location: [facility];  Service: Orthopedics;  Laterality: Left;  . NM MYOCAR PERF WALL MOTION  09/22/2008   lexiscan-EF 83%; glogal LV systolic fx is norm. ,evidence of mild ischemia basal anterior,midanterior and apical lateral region(s).   . ORIF PATELLA Right 08/29/2016   Procedure: OPEN REDUCTION INTERNAL (ORIF) FIXATION RIGHT PATELLA;  Surgeon: Samson Frederic, MD;  Location: [facility];  Service: Orthopedics;  Laterality: Right;  Adductor Block  . TUBAL LIGATION    . TYMPANOPLASTY Bilateral   . UVULOPALATOPHARYNGOPLASTY     pt denies     Social History   Tobacco Use  . Smoking status: Never Smoker  . Smokeless tobacco: Never Used  Substance Use Topics  . Alcohol use: Yes    Comment: occasional, 1 a month wine  . Drug use: No    Family History  Problem Relation Age of Onset  . Coronary artery disease Mother   . Rheum arthritis Mother   . Dementia Mother   . Heart attack Father   . Hypertension Father   . Hyperlipidemia Father   . Other Father        MVA  . Hypertension Brother   . Cancer Brother   . Heart disease Brother   . Cancer Paternal Grandmother        stomach  . Diabetes Paternal Grandfather   . Breast cancer Neg Hx     Allergies  Allergen Reactions  . Niacin And Related Other (See Comments)    [place]ps and skin flushed, mouth tingling  . Tolectin [Tolmetin] Rash    Medication list has been reviewed and updated.  Current Outpatient Medications on File Prior to Visit  Medication Sig Dispense Refill  . acetaminophen (TYLENOL) 500 MG tablet Take 500 mg by mouth every 6 (six) hours as needed for mild pain or headache.    Marland Kitchen aspirin EC 81 MG EC tablet Take 1 tablet (81 mg total) by mouth daily. 30 tablet 1  . calcium carbonate (OS-CAL) 1250 (500 Ca) MG chewable tablet Chew 1  tablet by mouth daily.    . cetirizine (ZYRTEC) 10 MG chewable tablet Chew 10 mg by mouth daily as needed for allergies.    . cholecalciferol (VITAMIN D) 1000 UNITS tablet Take 1,000 Units by mouth daily.    . clopidogrel (PLAVIX) 75 MG tablet TAKE 1 TABLET BY MOUTH DAILY. 90 tablet 3  . diazepam (VALIUM) 2 MG tablet TAKE 1 TABLET BY MOUTH EVERY 12 hours as needed for anxiety 40 tablet 1  . famotidine (PEPCID) 20 MG tablet TAKE 1 TABLET BY MOUTH 2 TIMES DAILY. 180 tablet 2  . FLUoxetine (PROZAC) 20 MG tablet Take 1 tablet (20 mg total) by mouth daily. 30 tablet 3  . fluticasone (FLONASE) 50 MCG/ACT nasal spray Place 1 spray into both nostrils daily as needed for allergies or rhinitis.    Marland Kitchen meclizine (ANTIVERT) 25 MG tablet Take 25 mg by mouth 3 (three) times daily as needed for dizziness.    . metFORMIN (GLUCOPHAGE-XR) 500 MG 24 hr tablet Take 1 tablet (500 mg total) by mouth daily with breakfast. 90 tablet 3  . metoprolol succinate (TOPROL-XL) 25 MG 24 hr tablet TAKE 1/2 TABLET BY MOUTH DAILY 45 tablet 3  . Multiple Vitamin (MULTIVITAMIN WITH MINERALS) TABS tablet Take 1 tablet by mouth daily.    . nitroGLYCERIN (NITROSTAT) 0.4 MG SL tablet Place 1 tablet (0.4 mg total) under the tongue every 5 (five) minutes x 3 doses as needed for chest pain. 25 tablet 3  . simvastatin (ZOCOR) 40 MG tablet Take 1 tablet (40 mg total) by mouth every morning.    Marland Kitchen telmisartan (MICARDIS) 40 MG tablet Take 1 tablet (40 mg total) by mouth every morning. 90 tablet 3   No current facility-administered medications on file prior to visit.     Review of Systems:  As per HPI- otherwise negative.  Pulse Readings  from Last 3 Encounters:  04/21/17 (!) 53  01/08/17 68  12/26/16 72     Physical Examination: Vitals:   04/21/17 1038  BP: 124/84  Pulse: (!) 53  Temp: (!) 97.5 F (36.4 C)  SpO2: 98%   Vitals:   04/21/17 1038  Weight: 218 lb (98.9 kg)  Height: 5\' 1"  (1.549 m)   Body mass index is 41.19  kg/m. Ideal Body Weight: Weight in (lb) to have BMI = 25: 132  GEN: WDWN, NAD, Non-toxic, A & O x 3, obese, accompanied by [name], her husband, today HEENT: Atraumatic, Normocephalic. Neck supple. No masses, No LAD.  Bilateral TM wnl, oropharynx normal.  PEERL,EOMI.   Ears and Nose: No external deformity. CV: RRR, No M/G/R. No JVD. No thrill. No extra heart sounds. PULM: CTA B, no wheezes, crackles, rhonchi. No retractions. No resp. distress. No accessory muscle use. ABD: S, NT, ND, +BS. No rebound. No HSM. EXTR: No c/c/e [facility] on presentation to clinic.  She is able to get up and walk across exam room on her own. However, she is eager to be near a wall or other object that she can grab onto if she feels unsteady PSYCH: Normally interactive. Conversant. Not depressed or anxious appearing.  Calm demeanor.    Assessment and Plan: Controlled type 2 diabetes mellitus without complication, without long-term current use of insulin (HCC) - Plan: Basic metabolic panel, Hemoglobin A1c  Essential hypertension - Plan: CBC  GAD (generalized anxiety disorder)  Shortness of breath - Plan: DG Chest 2 View  Immunization due - Plan: Pneumococcal polysaccharide vaccine 23-valent greater than or equal to 2yo subcutaneous/IM  Kody fell in May of last year, broke her patella and also hit her head.  unfortunately she is still suffering the effects of this fall and is not able to walk easily on her own- I suspect she has a combination of concussion sx, anxiety, and difficulty recovering from her knee injury.   Today she also describes feeling like she cannot get her eyes to focus normally, and like her eyes are easily confused by patterns, other visual stimuli.  Suggested vision rehab and she will look into this She notes that she gets SOB easily- may be due to deconditioning and anxiety, but will check a chest film for her today She did have a CP eval/ myoview just in September of 18 which was  negative so we are less suspicious of any cardiac etiology  Signed Abbe AmsterdamJessica Copland, MD  Received her film and labs - message to pt  Dg Chest 2 View  Result Date: 04/21/2017 CLINICAL DATA:  Shortness of breath. EXAM: CHEST  2 VIEW COMPARISON:  Radiographs of December 11, 2016. FINDINGS: The heart size and mediastinal contours are within normal limits. Both lungs are clear. No pneumothorax or pleural effusion is noted. The visualized skeletal structures are unremarkable. IMPRESSION: No active cardiopulmonary disease. Electronically Signed   By: Lupita RaiderJames  Green Jr, M.D.   On: 04/21/2017 11:45   Results for orders placed or performed in visit on 04/21/17  Basic metabolic panel  Result Value Ref Range   Sodium 143 135 - 145 mEq/L   Potassium 4.6 3.5 - 5.1 mEq/L   Chloride 105 96 - 112 mEq/L   CO2 29 19 - 32 mEq/L   Glucose, Bld 129 (H) 70 - 99 mg/dL   BUN 13 6 - 23 mg/dL   Creatinine, Ser 1.610.78 0.40 - 1.20 mg/dL   Calcium 09.610.3 8.4 -  10.5 mg/dL   GFR 81.19 >14.78 mL/min  CBC  Result Value Ref Range   WBC 10.3 4.0 - 10.5 K/uL   RBC 4.92 3.87 - 5.11 Mil/uL   Platelets 343.0 150.0 - 400.0 K/uL   Hemoglobin 12.4 12.0 - 15.0 g/dL   HCT 29.5 62.1 - 30.8 %   MCV 79.5 78.0 - 100.0 fl   MCHC 31.8 30.0 - 36.0 g/dL   RDW 65.7 84.6 - 96.2 %  Hemoglobin A1c  Result Value Ref Range   Hgb A1c MFr Bld 6.7 (H) 4.6 - 6.5 %    Message from her neurologist:  Sharlynn Oliphant, thanks for the message. I would recommend PT evaluation and and anxiety eval / tx (psychology).  If symptoms are progressively worsening, then neuropsychology evaluation could be done to eval for post-concussion syndrome baseline and monitoring vs onset of neurodegenerative dementia or pseudo-dementia of anxiety.  I am not sure that namenda will help, but it is generic and low side effects so it probably couldn't hurt to try. I would recommend to hold off on namenda until after neuropsych testing.  Thanks,   -VRP

## 2017-04-21 ENCOUNTER — Ambulatory Visit (INDEPENDENT_AMBULATORY_CARE_PROVIDER_SITE_OTHER): Payer: PPO | Admitting: Family Medicine

## 2017-04-21 ENCOUNTER — Encounter: Payer: Self-pay | Admitting: Family Medicine

## 2017-04-21 ENCOUNTER — Ambulatory Visit (HOSPITAL_BASED_OUTPATIENT_CLINIC_OR_DEPARTMENT_OTHER)
Admission: RE | Admit: 2017-04-21 | Discharge: 2017-04-21 | Disposition: A | Discharge status: Home / Self Care | Payer: PPO | Source: Ambulatory Visit | Attending: Family Medicine | Admitting: Family Medicine

## 2017-04-21 VITALS — BP 124/84 | HR 53 | Temp 97.5°F | Ht 61.0 in | Wt 218.0 lb

## 2017-04-21 DIAGNOSIS — R0602 Shortness of breath: Secondary | ICD-10-CM

## 2017-04-21 DIAGNOSIS — E119 Type 2 diabetes mellitus without complications: Secondary | ICD-10-CM

## 2017-04-21 DIAGNOSIS — F411 Generalized anxiety disorder: Secondary | ICD-10-CM

## 2017-04-21 DIAGNOSIS — Z23 Encounter for immunization: Secondary | ICD-10-CM | POA: Diagnosis not present

## 2017-04-21 DIAGNOSIS — F0781 Postconcussional syndrome: Secondary | ICD-10-CM

## 2017-04-21 DIAGNOSIS — I10 Essential (primary) hypertension: Secondary | ICD-10-CM

## 2017-04-21 LAB — BASIC METABOLIC PANEL
BUN: 13 mg/dL (ref 6–23)
CO2: 29 mEq/L (ref 19–32)
Calcium: 10.3 mg/dL (ref 8.4–10.5)
Chloride: 105 mEq/L (ref 96–112)
Creatinine, Ser: 0.78 mg/dL (ref 0.40–1.20)
GFR: 78.18 mL/min (ref >60.00)
Glucose, Bld: 129 mg/dL — ABNORMAL HIGH (ref 70–99)
POTASSIUM: 4.6 meq/L (ref 3.5–5.1)
SODIUM: 143 meq/L (ref 135–145)

## 2017-04-21 LAB — CBC
HCT: 39.1 % (ref 36.0–46.0)
HEMOGLOBIN: 12.4 g/dL (ref 12.0–15.0)
MCHC: 31.8 g/dL (ref 30.0–36.0)
MCV: 79.5 fl (ref 78.0–100.0)
PLATELETS: 343 10*3/uL (ref 150.0–400.0)
RBC: 4.92 Mil/uL (ref 3.87–5.11)
RDW: 15.2 % (ref 11.5–15.5)
WBC: 10.3 10*3/uL (ref 4.0–10.5)

## 2017-04-21 LAB — HEMOGLOBIN A1C: Hgb A1c MFr Bld: 6.7 % — ABNORMAL HIGH (ref 4.6–6.5)

## 2017-04-21 NOTE — Patient Instructions (Addendum)
I am going to contract your neurologist about any other medications which may be helpful for your current symptoms Please look into a neuro Optometrist-  Here is one that I found.  [facility]  Normand SloopStephen J. Kaluzne, O.D., P.A. Post Trauma Vision Care Developmental Optometrist Sports Vision Improvement Specialist [place], [place]  [phone]   Please let me know if you have any changes in your symptoms or worsening in the meantime   We will check your labs today

## 2017-04-22 DIAGNOSIS — M25661 Stiffness of right knee, not elsewhere classified: Secondary | ICD-10-CM | POA: Diagnosis not present

## 2017-04-23 ENCOUNTER — Other Ambulatory Visit: Payer: Self-pay | Admitting: Family Medicine

## 2017-04-23 DIAGNOSIS — R51 Headache: Secondary | ICD-10-CM | POA: Diagnosis not present

## 2017-04-23 DIAGNOSIS — G43109 Migraine with aura, not intractable, without status migrainosus: Secondary | ICD-10-CM | POA: Diagnosis not present

## 2017-04-23 MED FILL — FLUoxetine HCL 20 MG CAPS: 20 | 30 days supply | Qty: 30 | Fill #0

## 2017-04-25 ENCOUNTER — Encounter: Payer: Self-pay | Admitting: Family Medicine

## 2017-04-25 DIAGNOSIS — F0781 Postconcussional syndrome: Secondary | ICD-10-CM

## 2017-04-29 NOTE — Telephone Encounter (Signed)
Amb referral to neuropsychology  (dept speciality = neurology)  (department = [facility])  (provider = Dr. Koleen DistanceMaryBeth Bailar-Heath)

## 2017-04-30 ENCOUNTER — Encounter: Payer: Self-pay | Admitting: Family Medicine

## 2017-04-30 ENCOUNTER — Encounter: Payer: Self-pay | Admitting: Psychology

## 2017-04-30 DIAGNOSIS — S82091D Other fracture of right patella, subsequent encounter for closed fracture with routine healing: Secondary | ICD-10-CM | POA: Diagnosis not present

## 2017-05-01 MED FILL — METOPROLOL SUCC ER 25 MG TA: 25 | 90 days supply | Qty: 45 | Fill #2

## 2017-05-17 ENCOUNTER — Encounter: Payer: Self-pay | Admitting: Family Medicine

## 2017-05-19 ENCOUNTER — Ambulatory Visit (INDEPENDENT_AMBULATORY_CARE_PROVIDER_SITE_OTHER): Payer: PPO | Admitting: Family

## 2017-05-19 ENCOUNTER — Ambulatory Visit (HOSPITAL_BASED_OUTPATIENT_CLINIC_OR_DEPARTMENT_OTHER)
Admission: RE | Admit: 2017-05-19 | Discharge: 2017-05-19 | Disposition: A | Discharge status: Home / Self Care | Payer: PPO | Source: Ambulatory Visit | Attending: Family | Admitting: Family

## 2017-05-19 ENCOUNTER — Encounter: Payer: Self-pay | Admitting: Family

## 2017-05-19 VITALS — BP 97/53 | HR 71 | Temp 99.1°F | Resp 16 | Ht 61.0 in | Wt 218.0 lb

## 2017-05-19 DIAGNOSIS — R05 Cough: Secondary | ICD-10-CM | POA: Insufficient documentation

## 2017-05-19 DIAGNOSIS — R918 Other nonspecific abnormal finding of lung field: Secondary | ICD-10-CM | POA: Diagnosis not present

## 2017-05-19 DIAGNOSIS — I10 Essential (primary) hypertension: Secondary | ICD-10-CM | POA: Diagnosis not present

## 2017-05-19 DIAGNOSIS — R509 Fever, unspecified: Secondary | ICD-10-CM

## 2017-05-19 DIAGNOSIS — J101 Influenza due to other identified influenza virus with other respiratory manifestations: Secondary | ICD-10-CM

## 2017-05-19 DIAGNOSIS — R5383 Other fatigue: Secondary | ICD-10-CM | POA: Diagnosis not present

## 2017-05-19 DIAGNOSIS — J029 Acute pharyngitis, unspecified: Secondary | ICD-10-CM | POA: Diagnosis not present

## 2017-05-19 DIAGNOSIS — R079 Chest pain, unspecified: Secondary | ICD-10-CM | POA: Diagnosis not present

## 2017-05-19 LAB — POCT INFLUENZA A/B
INFLUENZA A, POC: POSITIVE — AB
INFLUENZA B, POC: NEGATIVE

## 2017-05-19 LAB — POCT RAPID STREP A (OFFICE): RAPID STREP A SCREEN: NEGATIVE

## 2017-05-19 NOTE — Patient Instructions (Signed)
Please complete chest x ray on the first floor. Do not take micardis tomorrow if your blood pressure reading is < 110/60.   Make sure to drink plenty of fluids today because I think you are dehydrated. If you develop worsening diarrhea, worsening weakness of if you cannot keep down fluids please go directly to the ER.   Influenza, Adult Influenza ("the flu") is an infection in the lungs, nose, and throat (respiratory tract). It is caused by a virus. The flu causes many common cold symptoms, as well as a high fever and body aches. It can make you feel very sick. The flu spreads easily from person to person (is contagious). Getting a flu shot (influenza vaccination) every year is the best way to prevent the flu. Follow these instructions at home:  Take over-the-counter and prescription medicines only as told by your doctor.  Use a cool mist humidifier to add moisture (humidity) to the air in your home. This can make it easier to breathe.  Rest as needed.  Drink enough fluid to keep your pee (urine) clear or pale yellow.  Cover your mouth and nose when you cough or sneeze.  Wash your hands with soap and water often, especially after you cough or sneeze. If you cannot use soap and water, use hand sanitizer.  Stay home from work or school as told by your doctor. Unless you are visiting your doctor, try to avoid leaving home until your fever has been gone for 24 hours without the use of medicine.  Keep all follow-up visits as told by your doctor. This is important. How is this prevented?  Getting a yearly (annual) flu shot is the best way to avoid getting the flu. You may get the flu shot in late summer, fall, or winter. Ask your doctor when you should get your flu shot.  Wash your hands often or use hand sanitizer often.  Avoid contact with people who are sick during cold and flu season.  Eat healthy foods.  Drink plenty of fluids.  Get enough sleep.  Exercise regularly. Contact a  doctor if:  You get new symptoms.  You have: ? Chest pain. ? Watery poop (diarrhea). ? A fever.  Your cough gets worse.  You start to have more mucus.  You feel sick to your stomach (nauseous).  You throw up (vomit). Get help right away if:  You start to be short of breath or have trouble breathing.  Your skin or nails turn a bluish color.  You have very bad pain or stiffness in your neck.  You get a sudden headache.  You get sudden pain in your face or ear.  You cannot stop throwing up. This information is not intended to replace advice given to you by your health care provider. Make sure you discuss any questions you have with your health care provider. Document Released: 12/19/2007 Document Revised: 08/17/2015 Document Reviewed: 01/03/2015 Elsevier Interactive Patient Education  2017 [employer]

## 2017-05-19 NOTE — Progress Notes (Signed)
Subjective:    Patient ID: [name], female    DOB: 05-03-1950, 67 y.o.   MRN: [id number]  HPI  Sun is a 67 yr old female who presents today with chief complaint of weakness. Reports that she had cough, vomiting, weakness and diarrhea.  Drinking fluids but eating very little.  Had diarrhea overnight and this AM.  Took pepto bismol which seemed to help. Reports associated fever (subjective),  headaches and cough.  Symptoms started 4 days ago.  Review of Systems    see HPI  Past Medical History:  Diagnosis Date  . Anemia   . Anginal pain (HCC)    pt relates to stress pt was seen by Dr Rennis Golden 3 wks ago was given metoprolol   . Bronchitis    hx of   . CAD (coronary artery disease)    PCI to LAD in 2010, mild residual disease in LCX and RCA  . Cataracts, bilateral   . Diabetes mellitus without complication (HCC)   . Family history of adverse reaction to anesthesia    pts mother had difficulty awakening   . GERD (gastroesophageal reflux disease)   . Hyperlipidemia LDL goal < 70   . Hypertension   . Lumbar back pain   . Numbness    left leg and foot   . Plantar fascia rupture    Left Foot  . Sleep apnea    uses CPAP      Social History   Socioeconomic History  . Marital status: Married    Spouse name: [name]  . Number of children: 2  . Years of education: MSN  . Highest education level: Not on file  Social Needs  . Financial resource strain: Not on file  . Food insecurity - worry: Not on file  . Food insecurity - inability: Not on file  . Transportation needs - medical: Not on file  . Transportation needs - non-medical: Not on file  Occupational History  . Occupation: DIRECTOR    Employer: [employer]  Tobacco Use  . Smoking status: Never Smoker  . Smokeless tobacco: Never Used  Substance and Sexual Activity  . Alcohol use: Yes    Comment: occasional, 1 a month wine  . Drug use: No  . Sexual activity: Yes  Other Topics Concern  . Not on file  Social  History Narrative      Married. Education: [employer]. Exercise: Yes   Patient lives at home with her spouse.   Caffeine use: 1 drink of tea a day    Past Surgical History:  Procedure Laterality Date  . ABDOMINAL HYSTERECTOMY    . BREAST CYST ASPIRATION  1995  . CAROTID STENT  2009   pt denies   . CORONARY ANGIOPLASTY WITH STENT PLACEMENT  2010and 10-02-2012   Stent DES, Xience to prox. LAD  . DOPPLER ECHOCARDIOGRAPHY  08/01/2009   EF=>55%,LV normal  . LEFT HEART CATHETERIZATION WITH CORONARY ANGIOGRAM N/A 10/02/2012   Procedure: LEFT HEART CATHETERIZATION WITH CORONARY ANGIOGRAM;  Surgeon: Runell Gess, MD;  Location: [facility] CATH LAB;  Service: Cardiovascular;  Laterality: N/A;  . lower arterial duplex  06/20/10   abi's normal,rgt 0.98,lft 1.06;bilateral PVRs normal  . LUMBAR LAMINECTOMY/DECOMPRESSION MICRODISCECTOMY Left 01/06/2013   Procedure: MICRO LUMBAR DECOMPRESSION L4-5 AND L5-S1;  Surgeon: Javier Docker, MD;  Location: [facility];  Service: Orthopedics;  Laterality: Left;  . LUMBAR LAMINECTOMY/DECOMPRESSION MICRODISCECTOMY Left 10/12/2014   Procedure: REVISION MICRO LUMBAR/DECOMPRESSION L4-5 LEFT ;  Surgeon: Jene Every,  MD;  Location: [facility];  Service: Orthopedics;  Laterality: Left;  . NM MYOCAR PERF WALL MOTION  09/22/2008   lexiscan-EF 83%; glogal LV systolic fx is norm. ,evidence of mild ischemia basal anterior,midanterior and apical lateral region(s).   . ORIF PATELLA Right 08/29/2016   Procedure: OPEN REDUCTION INTERNAL (ORIF) FIXATION RIGHT PATELLA;  Surgeon: Samson Frederic, MD;  Location: [facility];  Service: Orthopedics;  Laterality: Right;  Adductor Block  . TUBAL LIGATION    . TYMPANOPLASTY Bilateral   . UVULOPALATOPHARYNGOPLASTY     pt denies     Family History  Problem Relation Age of Onset  . Coronary artery disease Mother   . Rheum arthritis Mother   . Dementia Mother   . Heart attack Father   . Hypertension Father   . Hyperlipidemia Father   . Other Father          MVA  . Hypertension Brother   . Cancer Brother   . Heart disease Brother   . Cancer Paternal Grandmother        stomach  . Diabetes Paternal Grandfather   . Breast cancer Neg Hx     Allergies  Allergen Reactions  . Niacin And Related Other (See Comments)    Whelps and skin flushed, mouth tingling  . Tolectin [Tolmetin] Rash    Current Outpatient Medications on File Prior to Visit  Medication Sig Dispense Refill  . acetaminophen (TYLENOL) 500 MG tablet Take 500 mg by mouth every 6 (six) hours as needed for mild pain or headache.    Marland Kitchen aspirin EC 81 MG EC tablet Take 1 tablet (81 mg total) by mouth daily. 30 tablet 1  . calcium carbonate (OS-CAL) 1250 (500 Ca) MG chewable tablet Chew 1 tablet by mouth daily.    . cetirizine (ZYRTEC) 10 MG chewable tablet Chew 10 mg by mouth daily as needed for allergies.    . cholecalciferol (VITAMIN D) 1000 UNITS tablet Take 1,000 Units by mouth daily.    . clopidogrel (PLAVIX) 75 MG tablet TAKE 1 TABLET BY MOUTH DAILY. 90 tablet 3  . diazepam (VALIUM) 2 MG tablet TAKE 1 TABLET BY MOUTH EVERY 12 hours as needed for anxiety 40 tablet 1  . famotidine (PEPCID) 20 MG tablet TAKE 1 TABLET BY MOUTH 2 TIMES DAILY. 180 tablet 2  . FLUoxetine (PROZAC) 20 MG capsule TAKE 1 CAPSULE (20 MG TOTAL) BY MOUTH DAILY. 30 capsule 2  . FLUoxetine (PROZAC) 20 MG tablet Take 1 tablet (20 mg total) by mouth daily. 30 tablet 3  . fluticasone (FLONASE) 50 MCG/ACT nasal spray Place 1 spray into both nostrils daily as needed for allergies or rhinitis.    Marland Kitchen meclizine (ANTIVERT) 25 MG tablet Take 25 mg by mouth 3 (three) times daily as needed for dizziness.    . metFORMIN (GLUCOPHAGE-XR) 500 MG 24 hr tablet Take 1 tablet (500 mg total) by mouth daily with breakfast. 90 tablet 3  . metoprolol succinate (TOPROL-XL) 25 MG 24 hr tablet TAKE 1/2 TABLET BY MOUTH DAILY 45 tablet 3  . Multiple Vitamin (MULTIVITAMIN WITH MINERALS) TABS tablet Take 1 tablet by mouth daily.    .  nitroGLYCERIN (NITROSTAT) 0.4 MG SL tablet Place 1 tablet (0.4 mg total) under the tongue every 5 (five) minutes x 3 doses as needed for chest pain. 25 tablet 3  . simvastatin (ZOCOR) 40 MG tablet Take 1 tablet (40 mg total) by mouth every morning.    Marland Kitchen telmisartan (MICARDIS) 40 MG tablet Take  1 tablet (40 mg total) by mouth every morning. 90 tablet 3   No current facility-administered medications on file prior to visit.     BP (!) 97/53   Pulse 71   Temp 99.1 F (37.3 C) (Oral)   Resp 16   Ht 5\' 1"  (1.549 m)   Wt 218 lb (98.9 kg)   LMP  (LMP Unknown)   SpO2 98%   BMI 41.19 kg/m    Objective:   Physical Exam  Constitutional: She is oriented to person, place, and time. She appears well-developed and well-nourished.  Ill appearing white female.   HENT:  Head: Normocephalic and atraumatic.  Right Ear: Tympanic membrane and ear canal normal.  Left Ear: Tympanic membrane and ear canal normal.  Mouth/Throat: Posterior oropharyngeal erythema present. No posterior oropharyngeal edema.  Cardiovascular: Normal rate, regular rhythm and normal heart sounds.  No murmur heard. Pulmonary/Chest: Effort normal and breath sounds normal. No respiratory distress. She has no wheezes.  Musculoskeletal: She exhibits no edema.  Neurological: She is alert and oriented to person, place, and time.  Skin: Skin is warm and dry.  Psychiatric: She has a normal mood and affect. Her behavior is normal. Judgment and thought content normal.          Assessment & Plan:  Influenza A-rapid flu test is positive for influenza A.  Outside window for Tamiflu.  We discussed supportive measures including Tylenol as needed.  My biggest concern for her is dehydration.  Her initial pressure was low.  Follow-up blood pressure looked better at 97/53.  Her diarrhea seems to be slowing down and she is taking liquids by mouth.  I have encouraged her to push the fluids today.  She understands that should her diarrhea worsen  or if she feels like she is unable to take in adequate p.o.'s she should go to the ER for IV hydration.  I will check a chest x-ray to exclude pneumonia.  She is to let us know if symptoms are not improved in 3-4 days.  Patient verbalizes understanding.  HTN-I have advised the patient to hold her Micardis dose tomorrow morning if her blood pressure is less than 110/60.

## 2017-05-20 ENCOUNTER — Encounter: Payer: Self-pay | Admitting: Family Medicine

## 2017-05-20 ENCOUNTER — Encounter: Payer: Self-pay | Admitting: Family

## 2017-05-23 ENCOUNTER — Encounter: Payer: Self-pay | Admitting: Family

## 2017-05-23 ENCOUNTER — Other Ambulatory Visit: Payer: Self-pay | Admitting: Family

## 2017-05-23 MED ORDER — AMOXICILLIN 500 MG PO CAPS: 500.0000 mg | ORAL_CAPSULE | Freq: Three times a day (TID) | ORAL | 0 refills | Status: AC

## 2017-05-28 ENCOUNTER — Telehealth: Payer: Self-pay | Admitting: Family Medicine

## 2017-05-28 DIAGNOSIS — R05 Cough: Secondary | ICD-10-CM

## 2017-05-28 DIAGNOSIS — R059 Cough, unspecified: Secondary | ICD-10-CM

## 2017-05-28 MED ORDER — HYDROCODONE-HOMATROPINE 5-1.5 MG/5ML PO SYRP: 5.0000 mL | ORAL_SOLUTION | Freq: Three times a day (TID) | ORAL | 0 refills | Status: DC | PRN

## 2017-05-28 MED ORDER — BENZONATATE 100 MG PO CAPS: 100.0000 mg | ORAL_CAPSULE | Freq: Three times a day (TID) | ORAL | 0 refills | Status: DC | PRN

## 2017-05-28 MED FILL — FLUoxetine HCL 20 MG CAPS: 20 | 30 days supply | Qty: 30 | Fill #1

## 2017-05-28 MED FILL — BENZONATATE 100 MG CAP: 100 | 25 days supply | Qty: 75 | Fill #0

## 2017-05-28 MED FILL — HYDROCODONE-HOMATROPINE SOL: 5-1.5 | 6 days supply | Qty: 90 | Fill #0

## 2017-05-28 NOTE — Telephone Encounter (Signed)
Called her- she is miserable with cough and can't sleep rx for tessalon and hycodan She will not combine hycodan with valium, cautioned re sedation

## 2017-05-28 NOTE — Telephone Encounter (Signed)
Copied from CRM [phone]#[id number]. Topic: Quick Communication - See Telephone Encounter >> May 28, 2017 10:36 AM Rudi CocoLathan, Shalaine Payson M, NT wrote: CRM for notification. See Telephone encounter for:   05/28/17. Pt. Calling to see if something can be called in for cough that hasn't seem to go away since diagnosed with the flu last week.  [facility] [place] [address] [place] [address] Phone: [phone] Fax: [phone]

## 2017-05-28 NOTE — Telephone Encounter (Signed)
Please advise 

## 2017-06-04 MED FILL — CLOPIDOGREL 75 MG TABLET: 75 | 90 days supply | Qty: 90 | Fill #1

## 2017-06-05 MED FILL — METFORMIN HCL ER 500 MG TAB: 500 | 90 days supply | Qty: 90 | Fill #2

## 2017-06-10 ENCOUNTER — Other Ambulatory Visit: Payer: Self-pay | Admitting: Family Medicine

## 2017-06-10 DIAGNOSIS — R51 Headache: Secondary | ICD-10-CM | POA: Diagnosis not present

## 2017-06-10 DIAGNOSIS — R05 Cough: Secondary | ICD-10-CM

## 2017-06-10 DIAGNOSIS — R059 Cough, unspecified: Secondary | ICD-10-CM

## 2017-06-10 DIAGNOSIS — H5371 Glare sensitivity: Secondary | ICD-10-CM | POA: Diagnosis not present

## 2017-06-10 DIAGNOSIS — H04123 Dry eye syndrome of bilateral lacrimal glands: Secondary | ICD-10-CM | POA: Diagnosis not present

## 2017-06-11 ENCOUNTER — Encounter: Payer: Self-pay | Admitting: Family Medicine

## 2017-06-11 NOTE — Telephone Encounter (Signed)
Called her and LMOM - since this request is for a narcotic I would need to see her again and re-eval prior to another refill.  Please let me know if she is still sick- will also send her a mychart

## 2017-06-11 NOTE — Telephone Encounter (Signed)
Pt requesting refill on Hydrocodone-homatropine syrup. Please advise.

## 2017-06-12 ENCOUNTER — Emergency Department (HOSPITAL_BASED_OUTPATIENT_CLINIC_OR_DEPARTMENT_OTHER)
Admission: EM | Admit: 2017-06-12 | Discharge: 2017-06-12 | Disposition: A | Discharge status: Home / Self Care | Payer: PPO | Attending: Emergency Medicine | Admitting: Emergency Medicine

## 2017-06-12 ENCOUNTER — Encounter: Payer: Self-pay | Admitting: Family Medicine

## 2017-06-12 ENCOUNTER — Ambulatory Visit (INDEPENDENT_AMBULATORY_CARE_PROVIDER_SITE_OTHER): Payer: PPO | Admitting: Family Medicine

## 2017-06-12 ENCOUNTER — Encounter (HOSPITAL_BASED_OUTPATIENT_CLINIC_OR_DEPARTMENT_OTHER): Payer: Self-pay

## 2017-06-12 ENCOUNTER — Ambulatory Visit (HOSPITAL_BASED_OUTPATIENT_CLINIC_OR_DEPARTMENT_OTHER)
Admission: RE | Admit: 2017-06-12 | Discharge: 2017-06-12 | Disposition: A | Discharge status: Home / Self Care | Payer: PPO | Source: Ambulatory Visit | Attending: Family Medicine | Admitting: Family Medicine

## 2017-06-12 ENCOUNTER — Other Ambulatory Visit: Payer: Self-pay

## 2017-06-12 VITALS — BP 153/62 | HR 64 | Temp 98.1°F | Resp 20 | Ht 61.0 in | Wt 209.0 lb

## 2017-06-12 DIAGNOSIS — R0602 Shortness of breath: Secondary | ICD-10-CM | POA: Diagnosis not present

## 2017-06-12 DIAGNOSIS — Z7984 Long term (current) use of oral hypoglycemic drugs: Secondary | ICD-10-CM | POA: Insufficient documentation

## 2017-06-12 DIAGNOSIS — I10 Essential (primary) hypertension: Secondary | ICD-10-CM | POA: Diagnosis not present

## 2017-06-12 DIAGNOSIS — I2510 Atherosclerotic heart disease of native coronary artery without angina pectoris: Secondary | ICD-10-CM | POA: Insufficient documentation

## 2017-06-12 DIAGNOSIS — Z7902 Long term (current) use of antithrombotics/antiplatelets: Secondary | ICD-10-CM | POA: Diagnosis not present

## 2017-06-12 DIAGNOSIS — R52 Pain, unspecified: Secondary | ICD-10-CM | POA: Diagnosis not present

## 2017-06-12 DIAGNOSIS — J40 Bronchitis, not specified as acute or chronic: Secondary | ICD-10-CM | POA: Insufficient documentation

## 2017-06-12 DIAGNOSIS — R05 Cough: Secondary | ICD-10-CM

## 2017-06-12 DIAGNOSIS — R059 Cough, unspecified: Secondary | ICD-10-CM

## 2017-06-12 DIAGNOSIS — E119 Type 2 diabetes mellitus without complications: Secondary | ICD-10-CM | POA: Diagnosis not present

## 2017-06-12 DIAGNOSIS — Z79899 Other long term (current) drug therapy: Secondary | ICD-10-CM | POA: Diagnosis not present

## 2017-06-12 DIAGNOSIS — Z955 Presence of coronary angioplasty implant and graft: Secondary | ICD-10-CM | POA: Insufficient documentation

## 2017-06-12 DIAGNOSIS — R6883 Chills (without fever): Secondary | ICD-10-CM

## 2017-06-12 DIAGNOSIS — R112 Nausea with vomiting, unspecified: Secondary | ICD-10-CM | POA: Diagnosis not present

## 2017-06-12 DIAGNOSIS — R197 Diarrhea, unspecified: Secondary | ICD-10-CM | POA: Diagnosis not present

## 2017-06-12 DIAGNOSIS — Z7982 Long term (current) use of aspirin: Secondary | ICD-10-CM | POA: Insufficient documentation

## 2017-06-12 DIAGNOSIS — E860 Dehydration: Secondary | ICD-10-CM

## 2017-06-12 DIAGNOSIS — R053 Chronic cough: Secondary | ICD-10-CM

## 2017-06-12 LAB — COMPREHENSIVE METABOLIC PANEL
ALT: 19 U/L (ref 14–54)
AST: 25 U/L (ref 15–41)
Albumin: 4.3 g/dL (ref 3.5–5.0)
Alkaline Phosphatase: 124 U/L (ref 38–126)
Anion gap: 12 (ref 5–15)
BUN: 7 mg/dL (ref 6–20)
CHLORIDE: 102 mmol/L (ref 101–111)
CO2: 23 mmol/L (ref 22–32)
Calcium: 10.1 mg/dL (ref 8.9–10.3)
Creatinine, Ser: 0.79 mg/dL (ref 0.44–1.00)
GFR calc Af Amer: >60 mL/min (ref >60)
GFR calc non Af Amer: >60 mL/min (ref >60)
GLUCOSE: 108 mg/dL — AB (ref 65–99)
Potassium: 3.9 mmol/L (ref 3.5–5.1)
SODIUM: 137 mmol/L (ref 135–145)
Total Bilirubin: 0.6 mg/dL (ref 0.3–1.2)
Total Protein: 7.6 g/dL (ref 6.5–8.1)

## 2017-06-12 LAB — CBC WITH DIFFERENTIAL/PLATELET
BASOS ABS: 0.1 10*3/uL (ref 0.0–0.1)
Basophils Relative: 0 %
EOS ABS: 0.5 10*3/uL (ref 0.0–0.7)
EOS PCT: 3 %
HCT: 37.7 % (ref 36.0–46.0)
HEMOGLOBIN: 12 g/dL (ref 12.0–15.0)
LYMPHS PCT: 36 %
Lymphs Abs: 5.7 10*3/uL — ABNORMAL HIGH (ref 0.7–4.0)
MCH: 25.3 pg — ABNORMAL LOW (ref 26.0–34.0)
MCHC: 31.8 g/dL (ref 30.0–36.0)
MCV: 79.5 fL (ref 78.0–100.0)
Monocytes Absolute: 1.2 10*3/uL — ABNORMAL HIGH (ref 0.1–1.0)
Monocytes Relative: 7 %
NEUTROS PCT: 54 %
Neutro Abs: 8.6 10*3/uL — ABNORMAL HIGH (ref 1.7–7.7)
PLATELETS: 444 10*3/uL — AB (ref 150–400)
RBC: 4.74 MIL/uL (ref 3.87–5.11)
RDW: 15.8 % — ABNORMAL HIGH (ref 11.5–15.5)
WBC: 15.9 10*3/uL — AB (ref 4.0–10.5)

## 2017-06-12 LAB — URINALYSIS, ROUTINE W REFLEX MICROSCOPIC
Bilirubin Urine: NEGATIVE
GLUCOSE, UA: NEGATIVE mg/dL
Hgb urine dipstick: NEGATIVE
Ketones, ur: NEGATIVE mg/dL
Leukocytes, UA: NEGATIVE
Nitrite: NEGATIVE
PH: 6.5 (ref 5.0–8.0)
Protein, ur: NEGATIVE mg/dL

## 2017-06-12 LAB — POC INFLUENZA A&B (BINAX/QUICKVUE)
INFLUENZA B, POC: NEGATIVE
Influenza A, POC: NEGATIVE

## 2017-06-12 LAB — LIPASE, BLOOD: Lipase: 35 U/L (ref 11–51)

## 2017-06-12 MED ORDER — ONDANSETRON HCL 4 MG/2ML IJ SOLN
4.0000 mg | Freq: Once | INTRAMUSCULAR | Status: AC
  Administered 2017-06-12: 4 mg via INTRAVENOUS
  Filled 2017-06-12: qty 2

## 2017-06-12 MED ORDER — DOXYCYCLINE HYCLATE 100 MG PO CAPS: 100.0000 mg | ORAL_CAPSULE | Freq: Two times a day (BID) | ORAL | 0 refills | Status: AC

## 2017-06-12 MED ORDER — ONDANSETRON HCL 4 MG PO TABS: 4.0000 mg | ORAL_TABLET | Freq: Three times a day (TID) | ORAL | 0 refills | Status: DC | PRN

## 2017-06-12 MED ORDER — SODIUM CHLORIDE 0.9 % IV BOLUS (SEPSIS)
1000.0000 mL | Freq: Once | INTRAVENOUS | Status: AC
  Administered 2017-06-12: 1000 mL via INTRAVENOUS

## 2017-06-12 NOTE — Progress Notes (Signed)
[facility] [address] [place], [place] [address] [phone]  Date:  06/12/2017   Name:  [name]   DOB:  [birth year]   MRN:  [id number]  PCP:  Pearline Cables, MD    Chief Complaint: Influenza (positive flu A previously, no better, weakness, congestion, nausea, lightheaded, achey, no fever)   History of Present Illness:  [name] is a 67 y.o. very pleasant female patient who presents with the following:  She was here with influenza a in February Had contacted me as she was still not feeling well- she got better in between, but over the last 72 hours or so she had gotten sick again She is coughing- more dry She had contacted me about getting more hycodan which is why I had called her- it turned out she was feeling quite bad so I asked her to come in to be seen   She has felt cold, noted chills but has not measured a fever Her temp has been about 98 degrees, higher than is usual for her  She awoke in a sweat the other day Diarrhea yesterday 6x or so- used pepto which helped slow things down No vomiting but she felt like she could vomit a couple of times No urinary sx Her throat feels scratchy but not really sore    History of well controlled DM CAD s/p angioplasty Lab Results  Component Value Date   HGBA1C 6.7 (H) 04/21/2017    Patient Active Problem List   Diagnosis Date Noted  . Dizziness 10/02/2016  . Right patella fracture 08/29/2016  . Acquired foot deformity, left 07/18/2016  . Osteopenia 11/10/2015  . HNP (herniated nucleus pulposus), lumbar 10/12/2014  . Spinal stenosis at L4-L5 level 10/12/2014  . OSA on CPAP 07/29/2014  . Iron deficiency anemia 07/29/2014  . DM (diabetes mellitus) with complications (HCC) 07/29/2014  . Environmental and seasonal allergies 04/28/2014  . Spinal stenosis, lumbar region, with neurogenic claudication 01/06/2013  . Obesity, morbid, BMI 40.0-49.9 (HCC)  10/20/2012  . Chest pain with moderate risk of acute coronary syndrome 10/02/2012  . CAD S/P percutaneous coronary angioplasty   . Hyperlipidemia with target LDL less than 70   . Essential hypertension 04/16/2012    Past Medical History:  Diagnosis Date  . Anemia   . Anginal pain (HCC)    pt relates to stress pt was seen by Dr Rennis Golden 3 wks ago was given metoprolol   . Bronchitis    hx of   . CAD (coronary artery disease)    PCI to LAD in 2010, mild residual disease in LCX and RCA  . Cataracts, bilateral   . Diabetes mellitus without complication (HCC)   . Family history of adverse reaction to anesthesia    pts mother had difficulty awakening   . GERD (gastroesophageal reflux disease)   . Hyperlipidemia LDL goal < 70   . Hypertension   . Lumbar back pain   . Numbness    left leg and foot   . Plantar fascia rupture    Left Foot  . Sleep apnea    uses CPAP     Past Surgical History:  Procedure Laterality Date  . ABDOMINAL HYSTERECTOMY    . BREAST CYST ASPIRATION  1995  . CAROTID STENT  2009   pt denies   . CORONARY ANGIOPLASTY WITH STENT PLACEMENT  2010and 10-02-2012   Stent DES, Xience to prox. LAD  .  DOPPLER ECHOCARDIOGRAPHY  08/01/2009   EF=>55%,LV normal  . LEFT HEART CATHETERIZATION WITH CORONARY ANGIOGRAM N/A 10/02/2012   Procedure: LEFT HEART CATHETERIZATION WITH CORONARY ANGIOGRAM;  Surgeon: Runell Gess, MD;  Location: [facility] CATH LAB;  Service: Cardiovascular;  Laterality: N/A;  . lower arterial duplex  06/20/10   abi's normal,rgt 0.98,lft 1.06;bilateral PVRs normal  . LUMBAR LAMINECTOMY/DECOMPRESSION MICRODISCECTOMY Left 01/06/2013   Procedure: MICRO LUMBAR DECOMPRESSION L4-5 AND L5-S1;  Surgeon: Javier Docker, MD;  Location: [facility];  Service: Orthopedics;  Laterality: Left;  . LUMBAR LAMINECTOMY/DECOMPRESSION MICRODISCECTOMY Left 10/12/2014   Procedure: REVISION MICRO LUMBAR/DECOMPRESSION L4-5 LEFT ;  Surgeon: Jene Every, MD;  Location: [facility];  Service:  Orthopedics;  Laterality: Left;  . NM MYOCAR PERF WALL MOTION  09/22/2008   lexiscan-EF 83%; glogal LV systolic fx is norm. ,evidence of mild ischemia basal anterior,midanterior and apical lateral region(s).   . ORIF PATELLA Right 08/29/2016   Procedure: OPEN REDUCTION INTERNAL (ORIF) FIXATION RIGHT PATELLA;  Surgeon: Samson Frederic, MD;  Location: [facility];  Service: Orthopedics;  Laterality: Right;  Adductor Block  . TUBAL LIGATION    . TYMPANOPLASTY Bilateral   . UVULOPALATOPHARYNGOPLASTY     pt denies     Social History   Tobacco Use  . Smoking status: Never Smoker  . Smokeless tobacco: Never Used  Substance Use Topics  . Alcohol use: Yes    Comment: occasional, 1 a month wine  . Drug use: No    Family History  Problem Relation Age of Onset  . Coronary artery disease Mother   . Rheum arthritis Mother   . Dementia Mother   . Heart attack Father   . Hypertension Father   . Hyperlipidemia Father   . Other Father        MVA  . Hypertension Brother   . Cancer Brother   . Heart disease Brother   . Cancer Paternal Grandmother        stomach  . Diabetes Paternal Grandfather   . Breast cancer Neg Hx     Allergies  Allergen Reactions  . Niacin And Related Other (See Comments)    Whelps and skin flushed, mouth tingling  . Tolectin [Tolmetin] Rash    Medication list has been reviewed and updated.  Current Outpatient Medications on File Prior to Visit  Medication Sig Dispense Refill  . acetaminophen (TYLENOL) 500 MG tablet Take 500 mg by mouth every 6 (six) hours as needed for mild pain or headache.    Marland Kitchen aspirin EC 81 MG EC tablet Take 1 tablet (81 mg total) by mouth daily. 30 tablet 1  . calcium carbonate (OS-CAL) 1250 (500 Ca) MG chewable tablet Chew 1 tablet by mouth daily.    . cetirizine (ZYRTEC) 10 MG chewable tablet Chew 10 mg by mouth daily as needed for allergies.    . cholecalciferol (VITAMIN D) 1000 UNITS tablet Take 1,000 Units by mouth daily.    . clopidogrel  (PLAVIX) 75 MG tablet TAKE 1 TABLET BY MOUTH DAILY. 90 tablet 3  . diazepam (VALIUM) 2 MG tablet TAKE 1 TABLET BY MOUTH EVERY 12 hours as needed for anxiety 40 tablet 1  . famotidine (PEPCID) 20 MG tablet TAKE 1 TABLET BY MOUTH 2 TIMES DAILY. 180 tablet 2  . FLUoxetine (PROZAC) 20 MG capsule TAKE 1 CAPSULE (20 MG TOTAL) BY MOUTH DAILY. 30 capsule 2  . fluticasone (FLONASE) 50 MCG/ACT nasal spray Place 1 spray into both nostrils daily as needed for allergies or  rhinitis.    Marland Kitchen meclizine (ANTIVERT) 25 MG tablet Take 25 mg by mouth 3 (three) times daily as needed for dizziness.    . metFORMIN (GLUCOPHAGE-XR) 500 MG 24 hr tablet Take 1 tablet (500 mg total) by mouth daily with breakfast. 90 tablet 3  . metoprolol succinate (TOPROL-XL) 25 MG 24 hr tablet TAKE 1/2 TABLET BY MOUTH DAILY 45 tablet 3  . Multiple Vitamin (MULTIVITAMIN WITH MINERALS) TABS tablet Take 1 tablet by mouth daily.    . nitroGLYCERIN (NITROSTAT) 0.4 MG SL tablet Place 1 tablet (0.4 mg total) under the tongue every 5 (five) minutes x 3 doses as needed for chest pain. 25 tablet 3  . simvastatin (ZOCOR) 40 MG tablet Take 1 tablet (40 mg total) by mouth every morning.    Marland Kitchen telmisartan (MICARDIS) 40 MG tablet Take 1 tablet (40 mg total) by mouth every morning. 90 tablet 3  . benzonatate (TESSALON) 100 MG capsule Take 1 capsule (100 mg total) by mouth 3 (three) times daily as needed for cough. (Patient not taking: Reported on 06/12/2017) 75 capsule 0   No current facility-administered medications on file prior to visit.     Review of Systems:  As per HPI- otherwise negative.   Physical Examination: Vitals:   06/12/17 1431  BP: (!) 153/62  Pulse: 64  Resp: 20  Temp: 98.1 F (36.7 C)  SpO2: 100%   Vitals:   06/12/17 1431  Weight: 209 lb (94.8 kg)  Height: 5\' 1"  (1.549 m)   Body mass index is 39.49 kg/m. Ideal Body Weight: Weight in (lb) to have BMI = 25: 132  GEN: WDWN, NAD, Non-toxic, A & O x 3, obese, does not look  herself today HEENT: Atraumatic, Normocephalic. Neck supple. No masses, No LAD.  Bilateral TM wnl, oropharynx normal.  PEERL,EOMI.   Ears and Nose: No external deformity. CV: RRR, No M/G/R. No JVD. No thrill. No extra heart sounds. PULM: CTA B, no wheezes, crackles, rhonchi. No retractions. No resp. distress. No accessory muscle use. ABD: S, NT, ND, +BS. No rebound. No HSM. EXTR: No c/c/e NEURO sittting in [facility] as she feels very weak today, she is a bit shaky PSYCH: Normally interactive. Conversant. Not depressed or anxious appearing.  Calm demeanor.   Results for orders placed or performed in visit on 06/12/17  POC Influenza A&B (Binax test)  Result Value Ref Range   Influenza A, POC Negative Negative   Influenza B, POC Negative Negative   Dg Chest 2 View  Result Date: 06/12/2017 CLINICAL DATA:  Cough, congestion, shortness of breath and chills for 3 weeks, had the flu 3 weeks ago, is not getting better EXAM: CHEST - 2 VIEW COMPARISON:  05/19/2017 FINDINGS: Normal heart size, mediastinal contours, and pulmonary vascularity. Minimal central peribronchial thickening. Lungs otherwise clear. No definite infiltrate, pleural effusion or pneumothorax. Bones demineralized. IMPRESSION: Minimal bronchitic changes without infiltrate. Electronically Signed   By: Ulyses Southward M.D.   On: 06/12/2017 15:11   Dg Chest 2 View  Result Date: 05/20/2017 CLINICAL DATA:  Chest pain.  Fever. EXAM: CHEST  2 VIEW COMPARISON:  04/21/2017.  12/07/2012. FINDINGS: Mediastinum hilar structures normal. Lungs are clear of acute infiltrates. Mild bibasilar pleural-parenchymal thickening consistent scarring. No pleural effusion or pneumothorax. IMPRESSION: Mild bibasal pleuroparenchymal thickening consistent with scarring noted. No acute cardiopulmonary disease. Electronically Signed   By: Maisie Fus  Register   On: 05/20/2017 06:53    Assessment and Plan: Body aches - Plan: POC Influenza A&B (Binax test),  DG Chest 2  View  Chills - Plan: POC Influenza A&B (Binax test), DG Chest 2 View  Cough - Plan: DG Chest 2 View  Here today with body aches, chills, feeling quite bad Does not look herself Flu negative, CXR ?mild bronchitis Myriam JacobsonHelen does not feel good and would like to have further eval and treatment today, will refer to the ER appreciate ER care of this delightful lady  Her husband [name] feels comfortable accompanying her to the ER, they decline staff chaperone   Signed Abbe AmsterdamJessica Jolyssa Oplinger, MD

## 2017-06-12 NOTE — ED Notes (Addendum)
Pt has had diarrhea, cough, sinus congestion, and sore throat x3-4 weeks.  Sts her sx are gradually getting better but her pmd sent her here today to see if she was dehydrated and possibly get fluids - per pt. No new sx since she was seen for the flu 3-4 weeks ago. Had cxr today that was neg for pneumonia.

## 2017-06-12 NOTE — ED Triage Notes (Signed)
Body aches and fatigue. No appetite. When she eats she gags and coughs so hard it makes her vomit. She feels like she has the flu. She had the flu 3 weeks ago.

## 2017-06-12 NOTE — Discharge Instructions (Signed)
Your workup and evaluation today showed concern for dehydration.  You improved after fluids.  Your x-ray earlier did not show pneumonia did not show evidence of bronchitis.  Given your cough for multiple weeks, we want to treat you for atypical infection and gave you doxycycline.  We will also give you nausea medicine to help stay hydrated.  Please follow-up with your primary doctor in the next several days.  If any symptoms change or worsen, please return to the nearest emergency department.

## 2017-06-12 NOTE — ED Provider Notes (Signed)
[facility] EMERGENCY DEPARTMENT Provider Note   CSN: [id number] Arrival date & time: 06/12/17  1523     History   Chief Complaint Chief Complaint  Patient presents with  . Generalized Body Aches    HPI JOURDIN GENS is a 67 y.o. female.  The history is provided by the patient, medical records and the spouse. No language interpreter was used.  Diarrhea   This is a new problem. The current episode started more than 2 days ago. The problem occurs 5 to 10 times per day. The problem has not changed since onset.The stool consistency is described as watery. There has been no fever. Associated symptoms include vomiting, chills and cough. Pertinent negatives include no abdominal pain, no sweats, no headaches and no URI. She has tried nothing for the symptoms.  Emesis   This is a new problem. The current episode started more than 2 days ago. The problem occurs 5 to 10 times per day. The problem has not changed since onset.The emesis has an appearance of stomach contents. Associated symptoms include chills, cough and diarrhea. Pertinent negatives include no abdominal pain, no fever, no headaches, no sweats and no URI.    Past Medical History:  Diagnosis Date  . Anemia   . Anginal pain (HCC)    pt relates to stress pt was seen by Dr Rennis Golden 3 wks ago was given metoprolol   . Bronchitis    hx of   . CAD (coronary artery disease)    PCI to LAD in 2010, mild residual disease in LCX and RCA  . Cataracts, bilateral   . Diabetes mellitus without complication (HCC)   . Family history of adverse reaction to anesthesia    pts mother had difficulty awakening   . GERD (gastroesophageal reflux disease)   . Hyperlipidemia LDL goal < 70   . Hypertension   . Lumbar back pain   . Numbness    left leg and foot   . Plantar fascia rupture    Left Foot  . Sleep apnea    uses CPAP     Patient Active Problem List   Diagnosis Date Noted  . Dizziness 10/02/2016  . Right patella fracture  08/29/2016  . Acquired foot deformity, left 07/18/2016  . Osteopenia 11/10/2015  . HNP (herniated nucleus pulposus), lumbar 10/12/2014  . Spinal stenosis at L4-L5 level 10/12/2014  . OSA on CPAP 07/29/2014  . Iron deficiency anemia 07/29/2014  . DM (diabetes mellitus) with complications (HCC) 07/29/2014  . Environmental and seasonal allergies 04/28/2014  . Spinal stenosis, lumbar region, with neurogenic claudication 01/06/2013  . Obesity, morbid, BMI 40.0-49.9 (HCC) 10/20/2012  . Chest pain with moderate risk of acute coronary syndrome 10/02/2012  . CAD S/P percutaneous coronary angioplasty   . Hyperlipidemia with target LDL less than 70   . Essential hypertension 04/16/2012    Past Surgical History:  Procedure Laterality Date  . ABDOMINAL HYSTERECTOMY    . BREAST CYST ASPIRATION  1995  . CAROTID STENT  2009   pt denies   . CORONARY ANGIOPLASTY WITH STENT PLACEMENT  2010and 10-02-2012   Stent DES, Xience to prox. LAD  . DOPPLER ECHOCARDIOGRAPHY  08/01/2009   EF=>55%,LV normal  . LEFT HEART CATHETERIZATION WITH CORONARY ANGIOGRAM N/A 10/02/2012   Procedure: LEFT HEART CATHETERIZATION WITH CORONARY ANGIOGRAM;  Surgeon: Runell Gess, MD;  Location: [facility] CATH LAB;  Service: Cardiovascular;  Laterality: N/A;  . lower arterial duplex  06/20/10   abi's normal,rgt 0.98,lft 1.06;bilateral  PVRs normal  . LUMBAR LAMINECTOMY/DECOMPRESSION MICRODISCECTOMY Left 01/06/2013   Procedure: MICRO LUMBAR DECOMPRESSION L4-5 AND L5-S1;  Surgeon: Javier Docker, MD;  Location: [facility] ORS;  Service: Orthopedics;  Laterality: Left;  . LUMBAR LAMINECTOMY/DECOMPRESSION MICRODISCECTOMY Left 10/12/2014   Procedure: REVISION MICRO LUMBAR/DECOMPRESSION L4-5 LEFT ;  Surgeon: Jene Every, MD;  Location: [facility] OR[facility];  Service: Orthopedics;  Laterality: Left;  . NM MYOCAR PERF WALL MOTION  09/22/2008   lexiscan-EF 83%; glogal LV systolic fx is norm. ,evidence of mild ischemia basal anterior,midanterior and apical lateral  region(s).   . ORIF PATELLA Right 08/29/2016   Procedure: OPEN REDUCTION INTERNAL (ORIF) FIXATION RIGHT PATELLA;  Surgeon: Samson Frederic, MD;  Location: [facility];  Service: Orthopedics;  Laterality: Right;  Adductor Block  . TUBAL LIGATION    . TYMPANOPLASTY Bilateral   . UVULOPALATOPHARYNGOPLASTY     pt denies     OB History   None      Home Medications    Prior to Admission medications   Medication Sig Start Date End Date Taking? Authorizing Provider  acetaminophen (TYLENOL) 500 MG tablet Take 500 mg by mouth every 6 (six) hours as needed for mild pain or headache.    [provider]  aspirin EC 81 MG EC tablet Take 1 tablet (81 mg total) by mouth daily. 08/30/16   Swinteck, Arlys John, MD  benzonatate (TESSALON) 100 MG capsule Take 1 capsule (100 mg total) by mouth 3 (three) times daily as needed for cough. Patient not taking: Reported on 06/12/2017 05/28/17   Copland, [name], MD  calcium carbonate (OS-CAL) 1250 (500 Ca) MG chewable tablet Chew 1 tablet by mouth daily.    [provider]  cetirizine (ZYRTEC) 10 MG chewable tablet Chew 10 mg by mouth daily as needed for allergies.    [provider]  cholecalciferol (VITAMIN D) 1000 UNITS tablet Take 1,000 Units by mouth daily.    [provider]  clopidogrel (PLAVIX) 75 MG tablet TAKE 1 TABLET BY MOUTH DAILY. 02/26/17   Hilty, Lisette Abu, MD  diazepam (VALIUM) 2 MG tablet TAKE 1 TABLET BY MOUTH EVERY 12 hours as needed for anxiety 12/23/16   Copland, [name], MD  famotidine (PEPCID) 20 MG tablet TAKE 1 TABLET BY MOUTH 2 TIMES DAILY. 07/10/16   Hilty, Lisette Abu, MD  FLUoxetine (PROZAC) 20 MG capsule TAKE 1 CAPSULE (20 MG TOTAL) BY MOUTH DAILY. 04/23/17   Copland, [name], MD  fluticasone (FLONASE) 50 MCG/ACT nasal spray Place 1 spray into both nostrils daily as needed for allergies or rhinitis.    [provider]  meclizine (ANTIVERT) 25 MG tablet Take 25 mg by mouth 3 (three) times daily as needed  for dizziness.    [provider]  metFORMIN (GLUCOPHAGE-XR) 500 MG 24 hr tablet Take 1 tablet (500 mg total) by mouth daily with breakfast. 07/18/16   Copland, [name], MD  metoprolol succinate (TOPROL-XL) 25 MG 24 hr tablet TAKE 1/2 TABLET BY MOUTH DAILY 10/09/16   Hilty, Lisette Abu, MD  Multiple Vitamin (MULTIVITAMIN WITH MINERALS) TABS tablet Take 1 tablet by mouth daily.    [provider]  nitroGLYCERIN (NITROSTAT) 0.4 MG SL tablet Place 1 tablet (0.4 mg total) under the tongue every 5 (five) minutes x 3 doses as needed for chest pain. 12/12/16   Abelino Derrick, PA-C  simvastatin (ZOCOR) 40 MG tablet Take 1 tablet (40 mg total) by mouth every morning. 12/12/16   Abelino Derrick, PA-C  telmisartan (  MICARDIS) 40 MG tablet Take 1 tablet (40 mg total) by mouth every morning. 12/12/16   Abelino Derrick, PA-C    Family History Family History  Problem Relation Age of Onset  . Coronary artery disease Mother   . Rheum arthritis Mother   . Dementia Mother   . Heart attack Father   . Hypertension Father   . Hyperlipidemia Father   . Other Father        MVA  . Hypertension Brother   . Cancer Brother   . Heart disease Brother   . Cancer Paternal Grandmother        stomach  . Diabetes Paternal Grandfather   . Breast cancer Neg Hx     Social History Social History   Tobacco Use  . Smoking status: Never Smoker  . Smokeless tobacco: Never Used  Substance Use Topics  . Alcohol use: Yes    Comment: occasional, 1 a month wine  . Drug use: No     Allergies   Niacin and related and Tolectin [tolmetin]   Review of Systems Review of Systems  Constitutional: Positive for chills and fatigue. Negative for diaphoresis and fever.  HENT: Positive for congestion and rhinorrhea.   Eyes: Negative for visual disturbance.  Respiratory: Positive for cough. Negative for chest tightness, shortness of breath, wheezing and stridor.   Cardiovascular: Negative for chest pain, palpitations  and leg swelling.  Gastrointestinal: Positive for diarrhea, nausea and vomiting. Negative for abdominal distention, abdominal pain, blood in stool and constipation.  Genitourinary: Positive for frequency. Negative for dysuria.  Musculoskeletal: Negative for back pain, neck pain and neck stiffness.  Skin: Negative for rash and wound.  Neurological: Negative for syncope, light-headedness and headaches.  Psychiatric/Behavioral: Negative for agitation.  All other systems reviewed and are negative.    Physical Exam Updated Vital Signs BP (!) 156/50 (BP Location: Right Arm)   Pulse 60   Temp 98.7 F (37.1 C) (Oral)   Resp 18   LMP  (LMP Unknown)   SpO2 100%   Physical Exam  Constitutional: She is oriented to person, place, and time. She appears well-developed and well-nourished. No distress.  HENT:  Head: Normocephalic.  Nose: Nose normal.  Mouth/Throat: Oropharynx is clear and moist. No oropharyngeal exudate.  Eyes: Pupils are equal, round, and reactive to light. Conjunctivae and EOM are normal.  Neck: Normal range of motion.  Cardiovascular: Normal rate and intact distal pulses.  No murmur heard. Pulmonary/Chest: Effort normal and breath sounds normal. No respiratory distress. She has no wheezes. She exhibits no tenderness.  Abdominal: She exhibits no distension. There is no tenderness.  Musculoskeletal: She exhibits no edema or tenderness.  Lymphadenopathy:    She has no cervical adenopathy.  Neurological: She is alert and oriented to person, place, and time. No sensory deficit. She exhibits normal muscle tone.  Skin: Capillary refill takes less than 2 seconds. No rash noted. She is not diaphoretic. No erythema.  Psychiatric: She has a normal mood and affect.  Nursing note and vitals reviewed.    ED Treatments / Results  Labs (all labs ordered are listed, but only abnormal results are displayed) Labs Reviewed  CBC WITH DIFFERENTIAL/PLATELET - Abnormal; Notable for the  following components:      Result Value   WBC 15.9 (*)    MCH 25.3 (*)    RDW 15.8 (*)    Platelets 444 (*)    Neutro Abs 8.6 (*)    Lymphs Abs 5.7 (*)  Monocytes Absolute 1.2 (*)    All other components within normal limits  COMPREHENSIVE METABOLIC PANEL - Abnormal; Notable for the following components:   Glucose, Bld 108 (*)    All other components within normal limits  URINALYSIS, ROUTINE W REFLEX MICROSCOPIC - Abnormal; Notable for the following components:   Specific Gravity, Urine <1.005 (*)    All other components within normal limits  URINE CULTURE  LIPASE, BLOOD    EKG  EKG Interpretation None       Radiology Dg Chest 2 View  Result Date: 06/12/2017 CLINICAL DATA:  Cough, congestion, shortness of breath and chills for 3 weeks, had the flu 3 weeks ago, is not getting better EXAM: CHEST - 2 VIEW COMPARISON:  05/19/2017 FINDINGS: Normal heart size, mediastinal contours, and pulmonary vascularity. Minimal central peribronchial thickening. Lungs otherwise clear. No definite infiltrate, pleural effusion or pneumothorax. Bones demineralized. IMPRESSION: Minimal bronchitic changes without infiltrate. Electronically Signed   By: Ulyses Southward M.D.   On: 06/12/2017 15:11    Procedures Procedures (including critical care time)  Medications Ordered in ED Medications  sodium chloride 0.9 % bolus 1,000 mL (0 mLs Intravenous Stopped 06/12/17 2221)  ondansetron (ZOFRAN) injection 4 mg (4 mg Intravenous Given 06/12/17 1919)     Initial Impression / Assessment and Plan / ED Course  I have reviewed the triage vital signs and the nursing notes.  Pertinent labs & imaging results that were available during my care of the patient were reviewed by me and considered in my medical decision making (see chart for details).     [name] is a 67 y.o. female with a past medical history significant for diabetes, CAD status post PCI, hypertension, hyperkalemia, GERD, and recent influenza  who presents with continued URI symptoms with cough, myalgias, nausea, vomiting, and diarrhea with decreased oral intake, urinary frequency, and fatigue.  Patient reports that over the last few days her symptoms have persisted including nausea vomiting diarrhea that have prevented her from maintaining hydration.  She has not been able to tolerate p.o. today.  She has also had some urinary frequency but denies any dysuria or hematuria.  She says that she went to her PCP today where she had a flu test that was negative for persistent flu and a chest x-ray that did not show pneumonia but there was evidence of bronchitis.  Due to patient feeling more fatigued and tired, PCP sent the patient to be evaluated in the ED to look for electrode abnormalities dehydration or other symptoms.  Patient denies any chest pain or shortness of breath.  She denies abdominal pain.  She denies any neurologic deficits.  She reports her cough now has a white production versus a previously green production.  On exam, lungs are clear.  Chest is nontender.  Abdomen nontender.  Back nontender.  Legs were nonedematous and no rashes were seen.  Patient had no focal neurologic deficits.  Patient overall appeared fatigued and ill.  As patient had chest x-ray that was reassuring from a rule out pneumonia standpoint today, do not feel she needs further chest imaging however will obtain lab testing to look for occult infection in the urine with the frequency as well as evidence of dehydration.  Patient be given fluids and nausea medicine initially.  If patient's vital signs remained reassuring and her workup is reassuring patient will likely be stable for discharge home with nausea medicine to continue her outpatient management of a likely viral versus bronchitis infection.  If patient's symptoms persist and she is unable to tolerate p.o., or if we find any abnormalities with her labs, will consider admission for rehydration.     Patient's  diagnostic work-up results and above.  Patient was found to have a leukocytosis of 15.9 which is worse than prior.  Metabolic panel overall reassuring.  Urinalysis did not show infection and lipase not elevated.  Based on patient's x-ray earlier today that showed bronchitis, her leukocytosis, and her cough is been going on for several weeks, patient will be treated with doxycycline for bronchitis and possible atypical infection.  Patient will be given prescription for nausea medicine to help maintain hydration.  Patient felt much better after fluids and nausea medicine.  Patient will follow-up with PCP in several days for reassessment.  Do not feel patient needs admission given improving symptoms.  Patient understood return precautions and was discharged in good condition.   Final Clinical Impressions(s) / ED Diagnoses   Final diagnoses:  Persistent cough  Bronchitis  Dehydration    ED Discharge Orders        Ordered    ondansetron (ZOFRAN) 4 MG tablet  Every 8 hours PRN     06/12/17 2248    doxycycline (VIBRAMYCIN) 100 MG capsule  2 times daily     06/12/17 2248      Clinical Impression: 1. Persistent cough   2. Bronchitis   3. Dehydration     Disposition: Discharge  Condition: Good  I have discussed the results, Dx and Tx plan with the pt(& family if present). He/she/they expressed understanding and agree(s) with the plan. Discharge instructions discussed at great length. Strict return precautions discussed and pt &/or family have verbalized understanding of the instructions. No further questions at time of discharge.    Discharge Medication List as of 06/12/2017 10:51 PM    START taking these medications   Details  doxycycline (VIBRAMYCIN) 100 MG capsule Take 1 capsule (100 mg total) by mouth 2 (two) times daily for 7 days., Starting Thu 06/12/2017, Until Thu 06/19/2017, Print    ondansetron (ZOFRAN) 4 MG tablet Take 1 tablet (4 mg total) by mouth every 8 (eight) hours as  needed., Starting Thu 06/12/2017, Print        Follow Up: Pearline Cables, MD [address] [place] [address] [phone]     [facility] [address] [id number] YQ [facility] [place] [address] [phone]       Ladonne Sharples, Canary Brim, MD 06/13/17 [phone]

## 2017-06-13 ENCOUNTER — Encounter: Payer: Self-pay | Admitting: Family Medicine

## 2017-06-13 MED FILL — ONDANSETRON HCL 4 MG TABLET: 4 | 4 days supply | Qty: 12 | Fill #0

## 2017-06-13 MED FILL — DOXYCYCLINE HYCLATE 100 MG: 100 | 7 days supply | Qty: 14 | Fill #0

## 2017-06-14 LAB — URINE CULTURE: Culture: NO GROWTH

## 2017-06-16 NOTE — Telephone Encounter (Signed)
Appt scheduled for 06/18/2017.

## 2017-06-17 NOTE — Progress Notes (Signed)
[facility] [address] [place], [place] [address] [phone]  Date:  06/18/2017   Name:  [name]   DOB:  [birth year]   MRN:  [id number]  PCP:  Pearline Cables, MD    Chief Complaint: Follow-up (Pt here for f/u visit. )   History of Present Illness:  [name] is a 67 y.o. very pleasant female patient who presents with the following:  Following up today. History of DM, CAD, HTN, hyperlipidemia Unfortunately [place] has had a tough last year She broke her patella last summer, and also seems to have suffered a concussion - from our visit in January She admits to being stressed-  [name] states that she seems to be very worried about falling, she is feeling dizzy all the time and does not want to leave the house.  She will get SOB when she leaves the house  She wonders if she might be having panic attacks She will feel SOB with exertion but this is not new She had a low risk myoview in September Pt notes that "I just don't feel good," she does not feel like she can drive She does not feel like she is depressed. She will hang onto the wall at home to help herself walk She had right knee surgery from her patellar fracture in May when she fell on 5/24- this is also when she hit her had.  Her unsteadiness on her knee is contributing to her feeling off balance At home she will use a cane some of the time.  She feels like she cannot walk independently for more than a few steps   She was seen here on 3/21 with sx of illness and I sent her to the ER as she did not look well She was given fluids and doxy: As patient had chest x-ray that was reassuring from a rule out pneumonia standpoint today, do not feel she needs further chest imaging however will obtain lab testing to look for occult infection in the urine with the frequency as well as evidence of dehydration.  Patient be given fluids and nausea medicine initially.  If  patient's vital signs remained reassuring and her workup is reassuring patient will likely be stable for discharge home with nausea medicine to continue her outpatient management of a likely viral versus bronchitis infection.  If patient's symptoms persist and she is unable to tolerate p.o., or if we find any abnormalities with her labs, will consider admission for rehydration.    Patient's diagnostic work-up results and above.  Patient was found to have a leukocytosis of 15.9 which is worse than prior.  Metabolic panel overall reassuring.  Urinalysis did not show infection and lipase not elevated. Based on patient's x-ray earlier today that showed bronchitis, her leukocytosis, and her cough is been going on for several weeks, patient will be treated with doxycycline for bronchitis and possible atypical infection.  Patient will be given prescription for nausea medicine to help maintain hydration.  Patient felt much better after fluids and nausea medicine.  Patient will follow-up with PCP in several days for reassessment.  Do not feel patient needs admission given improving symptoms.  Patient understood return precautions and was discharged in good condition.  Need to recheck her CBC today She was given doxycycline at her ER visit and will soon finish this course She notes that she continues to have sweats at home, but she has  not noted any fever She tried a topical pain rub on her knee earlier today and it really bothered her throat- it was menthol and too strong for her  She is using zofran still prn  No urinary sx noted - she did have a negative urine culture at her ER visit Her CMP/ lipase looked good  Dr. Loreta Ave did stop her iron last year- she had been on iron most of her life   She wonders if she could have gallstones causing her diarrhea and nausea.  Last abd [place] was about 7 years ago, and her daughter had gallstones and a lap chole.   She used a WC to come into the office today as it is a long  walk. However she is doing some walking without her walker.  She will hold onto a grocery cart and can go shopping Her right knee flexion is getting better She feels like she is significantly improved over last week, but not yet well   Her balance is getting better, she is using some special blue tinted glasses from her neuro-ophthalmologist.  They are thinking about vision rehab but have not started yet due to concerns about insurance coverage and cost  Lab Results  Component Value Date   WBC 15.9 (H) 06/12/2017   HGB 12.0 06/12/2017   HCT 37.7 06/12/2017   MCV 79.5 06/12/2017   PLT 444 (H) 06/12/2017   Lab Results  Component Value Date   HGBA1C 6.7 (H) 04/21/2017     Patient Active Problem List   Diagnosis Date Noted  . Dizziness 10/02/2016  . Right patella fracture 08/29/2016  . Acquired foot deformity, left 07/18/2016  . Osteopenia 11/10/2015  . HNP (herniated nucleus pulposus), lumbar 10/12/2014  . Spinal stenosis at L4-L5 level 10/12/2014  . OSA on CPAP 07/29/2014  . Iron deficiency anemia 07/29/2014  . DM (diabetes mellitus) with complications (HCC) 07/29/2014  . Environmental and seasonal allergies 04/28/2014  . Spinal stenosis, lumbar region, with neurogenic claudication 01/06/2013  . Obesity, morbid, BMI 40.0-49.9 (HCC) 10/20/2012  . Chest pain with moderate risk of acute coronary syndrome 10/02/2012  . CAD S/P percutaneous coronary angioplasty   . Hyperlipidemia with target LDL less than 70   . Essential hypertension 04/16/2012    Past Medical History:  Diagnosis Date  . Anemia   . Anginal pain (HCC)    pt relates to stress pt was seen by Dr Rennis Golden 3 wks ago was given metoprolol   . Bronchitis    hx of   . CAD (coronary artery disease)    PCI to LAD in 2010, mild residual disease in LCX and RCA  . Cataracts, bilateral   . Diabetes mellitus without complication (HCC)   . Family history of adverse reaction to anesthesia    pts mother had difficulty awakening    . GERD (gastroesophageal reflux disease)   . Hyperlipidemia LDL goal < 70   . Hypertension   . Lumbar back pain   . Numbness    left leg and foot   . Plantar fascia rupture    Left Foot  . Sleep apnea    uses CPAP     Past Surgical History:  Procedure Laterality Date  . ABDOMINAL HYSTERECTOMY    . BREAST CYST ASPIRATION  1995  . CAROTID STENT  2009   pt denies   . CORONARY ANGIOPLASTY WITH STENT PLACEMENT  2010and 10-02-2012   Stent DES, Xience to prox. LAD  . DOPPLER ECHOCARDIOGRAPHY  08/01/2009  EF=>55%,LV normal  . LEFT HEART CATHETERIZATION WITH CORONARY ANGIOGRAM N/A 10/02/2012   Procedure: LEFT HEART CATHETERIZATION WITH CORONARY ANGIOGRAM;  Surgeon: Runell Gess, MD;  Location: [facility] CATH LAB;  Service: Cardiovascular;  Laterality: N/A;  . lower arterial duplex  06/20/10   abi's normal,rgt 0.98,lft 1.06;bilateral PVRs normal  . LUMBAR LAMINECTOMY/DECOMPRESSION MICRODISCECTOMY Left 01/06/2013   Procedure: MICRO LUMBAR DECOMPRESSION L4-5 AND L5-S1;  Surgeon: Javier Docker, MD;  Location: [facility];  Service: Orthopedics;  Laterality: Left;  . LUMBAR LAMINECTOMY/DECOMPRESSION MICRODISCECTOMY Left 10/12/2014   Procedure: REVISION MICRO LUMBAR/DECOMPRESSION L4-5 LEFT ;  Surgeon: Jene Every, MD;  Location: [facility];  Service: Orthopedics;  Laterality: Left;  . NM MYOCAR PERF WALL MOTION  09/22/2008   lexiscan-EF 83%; glogal LV systolic fx is norm. ,evidence of mild ischemia basal anterior,midanterior and apical lateral region(s).   . ORIF PATELLA Right 08/29/2016   Procedure: OPEN REDUCTION INTERNAL (ORIF) FIXATION RIGHT PATELLA;  Surgeon: Samson Frederic, MD;  Location: [facility];  Service: Orthopedics;  Laterality: Right;  Adductor Block  . TUBAL LIGATION    . TYMPANOPLASTY Bilateral   . UVULOPALATOPHARYNGOPLASTY     pt denies     Social History   Tobacco Use  . Smoking status: Never Smoker  . Smokeless tobacco: Never Used  Substance Use Topics  . Alcohol use: Yes     Comment: occasional, 1 a month wine  . Drug use: No    Family History  Problem Relation Age of Onset  . Coronary artery disease Mother   . Rheum arthritis Mother   . Dementia Mother   . Heart attack Father   . Hypertension Father   . Hyperlipidemia Father   . Other Father        MVA  . Hypertension Brother   . Cancer Brother   . Heart disease Brother   . Cancer Paternal Grandmother        stomach  . Diabetes Paternal Grandfather   . Breast cancer Neg Hx     Allergies  Allergen Reactions  . Niacin And Related Other (See Comments)    Whelps and skin flushed, mouth tingling  . Tolectin [Tolmetin] Rash    Medication list has been reviewed and updated.  Current Outpatient Medications on File Prior to Visit  Medication Sig Dispense Refill  . acetaminophen (TYLENOL) 500 MG tablet Take 500 mg by mouth every 6 (six) hours as needed for mild pain or headache.    Marland Kitchen aspirin EC 81 MG EC tablet Take 1 tablet (81 mg total) by mouth daily. 30 tablet 1  . benzonatate (TESSALON) 100 MG capsule Take 1 capsule (100 mg total) by mouth 3 (three) times daily as needed for cough. (Patient not taking: Reported on 06/12/2017) 75 capsule 0  . calcium carbonate (OS-CAL) 1250 (500 Ca) MG chewable tablet Chew 1 tablet by mouth daily.    . cetirizine (ZYRTEC) 10 MG chewable tablet Chew 10 mg by mouth daily as needed for allergies.    . cholecalciferol (VITAMIN D) 1000 UNITS tablet Take 1,000 Units by mouth daily.    . clopidogrel (PLAVIX) 75 MG tablet TAKE 1 TABLET BY MOUTH DAILY. 90 tablet 3  . diazepam (VALIUM) 2 MG tablet TAKE 1 TABLET BY MOUTH EVERY 12 hours as needed for anxiety 40 tablet 1  . doxycycline (VIBRAMYCIN) 100 MG capsule Take 1 capsule (100 mg total) by mouth 2 (two) times daily for 7 days. 14 capsule 0  . famotidine (PEPCID) 20 MG tablet  TAKE 1 TABLET BY MOUTH 2 TIMES DAILY. 180 tablet 2  . FLUoxetine (PROZAC) 20 MG capsule TAKE 1 CAPSULE (20 MG TOTAL) BY MOUTH DAILY. 30 capsule 2  .  fluticasone (FLONASE) 50 MCG/ACT nasal spray Place 1 spray into both nostrils daily as needed for allergies or rhinitis.    Marland Kitchen. meclizine (ANTIVERT) 25 MG tablet Take 25 mg by mouth 3 (three) times daily as needed for dizziness.    . metFORMIN (GLUCOPHAGE-XR) 500 MG 24 hr tablet Take 1 tablet (500 mg total) by mouth daily with breakfast. 90 tablet 3  . metoprolol succinate (TOPROL-XL) 25 MG 24 hr tablet TAKE 1/2 TABLET BY MOUTH DAILY 45 tablet 3  . Multiple Vitamin (MULTIVITAMIN WITH MINERALS) TABS tablet Take 1 tablet by mouth daily.    . nitroGLYCERIN (NITROSTAT) 0.4 MG SL tablet Place 1 tablet (0.4 mg total) under the tongue every 5 (five) minutes x 3 doses as needed for chest pain. 25 tablet 3  . ondansetron (ZOFRAN) 4 MG tablet Take 1 tablet (4 mg total) by mouth every 8 (eight) hours as needed. 12 tablet 0  . simvastatin (ZOCOR) 40 MG tablet Take 1 tablet (40 mg total) by mouth every morning.    Marland Kitchen. telmisartan (MICARDIS) 40 MG tablet Take 1 tablet (40 mg total) by mouth every morning. 90 tablet 3   No current facility-administered medications on file prior to visit.     Review of Systems:  As per HPI- otherwise negative. No fever, but some sweats Able to eat   Physical Examination: Vitals:   06/18/17 1333  BP: 118/72  Pulse: (!) 56  Temp: 98 F (36.7 C)  SpO2: 99%   Vitals:   06/18/17 1333  Weight: 208 lb 6.4 oz (94.5 kg)  Height: 5\' 1"  (1.549 m)   Body mass index is 39.38 kg/m. Ideal Body Weight: Weight in (lb) to have BMI = 25: 132  GEN: WDWN, NAD, Non-toxic, A & O x 3, obese, looks like herself today and much improved from last week  HEENT: Atraumatic, Normocephalic. Neck supple. No masses, No LAD. Ears and Nose: No external deformity. CV: RRR, No M/G/R. No JVD. No thrill. No extra heart sounds. PULM: CTA B, no wheezes, crackles, rhonchi. No retractions. No resp. distress. No accessory muscle use. ABD: S, NT, ND. No rebound. No HSM. EXTR: No c/c/e NEURO sitting in a  WC today as it is a long walk to my office and she is still unsteady on her feet  PSYCH: Normally interactive. Conversant. Not depressed or anxious appearing.  Calm demeanor.    Assessment and Plan: Other elevated white blood cell (WBC) count - Plan: CBC with Differential/Platelet, Pathologist smear review, CANCELED: Pathologist smear review  History of hypochromic microcytic anemia - Plan: Ferritin  Non-intractable cyclical vomiting, presence of nausea not specified - Plan: US Abdomen Limited RUQ  Repeat labs as above today Certainly a ruq US is a reasonable idea, will get for her asap Will plan further follow- up pending labs.   Signed Abbe AmsterdamJessica Edel Rivero, MD

## 2017-06-18 ENCOUNTER — Encounter: Payer: Self-pay | Admitting: Family Medicine

## 2017-06-18 ENCOUNTER — Ambulatory Visit (INDEPENDENT_AMBULATORY_CARE_PROVIDER_SITE_OTHER): Payer: PPO | Admitting: Family Medicine

## 2017-06-18 VITALS — BP 118/72 | HR 56 | Temp 98.0°F | Ht 61.0 in | Wt 208.4 lb

## 2017-06-18 DIAGNOSIS — Z862 Personal history of diseases of the blood and blood-forming organs and certain disorders involving the immune mechanism: Secondary | ICD-10-CM

## 2017-06-18 DIAGNOSIS — G43A0 Cyclical vomiting, not intractable: Secondary | ICD-10-CM | POA: Diagnosis not present

## 2017-06-18 DIAGNOSIS — D72828 Other elevated white blood cell count: Secondary | ICD-10-CM | POA: Diagnosis not present

## 2017-06-18 LAB — CBC WITH DIFFERENTIAL/PLATELET
BASOS PCT: 0.9 % (ref 0.0–3.0)
Basophils Absolute: 0.1 10*3/uL (ref 0.0–0.1)
Eosinophils Absolute: 0.4 10*3/uL (ref 0.0–0.7)
Eosinophils Relative: 4.4 % (ref 0.0–5.0)
HCT: 36.9 % (ref 36.0–46.0)
HEMOGLOBIN: 11.7 g/dL — AB (ref 12.0–15.0)
LYMPHS ABS: 2.9 10*3/uL (ref 0.7–4.0)
Lymphocytes Relative: 31.8 % (ref 12.0–46.0)
MCHC: 31.6 g/dL (ref 30.0–36.0)
MCV: 80.1 fl (ref 78.0–100.0)
MONO ABS: 1 10*3/uL (ref 0.1–1.0)
MONOS PCT: 10.5 % (ref 3.0–12.0)
Neutro Abs: 4.8 10*3/uL (ref 1.4–7.7)
Neutrophils Relative %: 52.4 % (ref 43.0–77.0)
Platelets: 331 10*3/uL (ref 150.0–400.0)
RBC: 4.61 Mil/uL (ref 3.87–5.11)
RDW: 16.2 % — AB (ref 11.5–15.5)
WBC: 9.1 10*3/uL (ref 4.0–10.5)

## 2017-06-18 LAB — FERRITIN: FERRITIN: 100.4 ng/mL (ref 10.0–291.0)

## 2017-06-18 NOTE — Patient Instructions (Signed)
Good to see you today- I am glad that you are feeling better at least somewhat!   We will get labs today and also will set up an ultrasound of your gallbladder.  Please continue to check your temperature if you are not feeling well and keep me posted!

## 2017-06-19 ENCOUNTER — Ambulatory Visit (HOSPITAL_BASED_OUTPATIENT_CLINIC_OR_DEPARTMENT_OTHER)
Admission: RE | Admit: 2017-06-19 | Discharge: 2017-06-19 | Disposition: A | Discharge status: Home / Self Care | Payer: PPO | Source: Ambulatory Visit | Attending: Family Medicine | Admitting: Family Medicine

## 2017-06-19 DIAGNOSIS — K760 Fatty (change of) liver, not elsewhere classified: Secondary | ICD-10-CM | POA: Diagnosis not present

## 2017-06-19 DIAGNOSIS — G43A0 Cyclical vomiting, not intractable: Secondary | ICD-10-CM | POA: Diagnosis not present

## 2017-06-19 LAB — PATHOLOGIST SMEAR REVIEW

## 2017-06-20 ENCOUNTER — Encounter: Payer: Self-pay | Admitting: Family Medicine

## 2017-06-20 ENCOUNTER — Other Ambulatory Visit: Payer: Self-pay | Admitting: Family Medicine

## 2017-06-20 DIAGNOSIS — D649 Anemia, unspecified: Secondary | ICD-10-CM

## 2017-06-26 ENCOUNTER — Other Ambulatory Visit: Payer: Self-pay | Admitting: Internal Medicine

## 2017-06-26 MED FILL — SIMVASTATIN 40 MG TABLET: 40 | 90 days supply | Qty: 90 | Fill #3

## 2017-06-26 MED FILL — FAMOTIDINE 20 MG TABLET: 20 | 90 days supply | Qty: 180 | Fill #0

## 2017-06-26 MED FILL — FLUoxetine HCL 20 MG CAPS: 20 | 30 days supply | Qty: 30 | Fill #2

## 2017-07-03 ENCOUNTER — Other Ambulatory Visit (INDEPENDENT_AMBULATORY_CARE_PROVIDER_SITE_OTHER): Payer: PPO

## 2017-07-03 DIAGNOSIS — Z0000 Encounter for general adult medical examination without abnormal findings: Secondary | ICD-10-CM

## 2017-07-03 LAB — FECAL OCCULT BLOOD, IMMUNOCHEMICAL: Fecal Occult Bld: NEGATIVE

## 2017-07-04 ENCOUNTER — Encounter: Payer: Self-pay | Admitting: Family Medicine

## 2017-07-08 NOTE — Progress Notes (Addendum)
[facility] [address] [place], [place] [address] [phone]  Date:  07/09/2017   Name:  [name]   DOB:  [birth year]   MRN:  [id number]  PCP:  Pearline Cables, MD    Chief Complaint: Follow-up (Pt here for f/u visit. )   History of Present Illness:  [name] is a 67 y.o. very pleasant female patient who presents with the following:  Following up today History of OSA, MD, fracture of right patella and concussion in a fall a year ago, CAD s/p stent in 2014, spinal stenosis  She needs a repeat CBC today She really feels like her balance is getting better- she is now able to walk on grass again, and her confidence is much better  Overall she feels like she is getting back to her old self and is really pleased   Patient Active Problem List   Diagnosis Date Noted  . Dizziness 10/02/2016  . Right patella fracture 08/29/2016  . Acquired foot deformity, left 07/18/2016  . Osteopenia 11/10/2015  . HNP (herniated nucleus pulposus), lumbar 10/12/2014  . Spinal stenosis at L4-L5 level 10/12/2014  . OSA on CPAP 07/29/2014  . Iron deficiency anemia 07/29/2014  . DM (diabetes mellitus) with complications (HCC) 07/29/2014  . Environmental and seasonal allergies 04/28/2014  . Spinal stenosis, lumbar region, with neurogenic claudication 01/06/2013  . Obesity, morbid, BMI 40.0-49.9 (HCC) 10/20/2012  . Chest pain with moderate risk of acute coronary syndrome 10/02/2012  . CAD S/P percutaneous coronary angioplasty   . Hyperlipidemia with target LDL less than 70   . Essential hypertension 04/16/2012    Past Medical History:  Diagnosis Date  . Anemia   . Anginal pain (HCC)    pt relates to stress pt was seen by Dr Rennis Golden 3 wks ago was given metoprolol   . Bronchitis    hx of   . CAD (coronary artery disease)    PCI to LAD in 2010, mild residual disease in LCX and RCA  . Cataracts, bilateral   . Diabetes  mellitus without complication (HCC)   . Family history of adverse reaction to anesthesia    pts mother had difficulty awakening   . GERD (gastroesophageal reflux disease)   . Hyperlipidemia LDL goal < 70   . Hypertension   . Lumbar back pain   . Numbness    left leg and foot   . Plantar fascia rupture    Left Foot  . Sleep apnea    uses CPAP     Past Surgical History:  Procedure Laterality Date  . ABDOMINAL HYSTERECTOMY    . BREAST CYST ASPIRATION  1995  . CAROTID STENT  2009   pt denies   . CORONARY ANGIOPLASTY WITH STENT PLACEMENT  2010and 10-02-2012   Stent DES, Xience to prox. LAD  . DOPPLER ECHOCARDIOGRAPHY  08/01/2009   EF=>55%,LV normal  . LEFT HEART CATHETERIZATION WITH CORONARY ANGIOGRAM N/A 10/02/2012   Procedure: LEFT HEART CATHETERIZATION WITH CORONARY ANGIOGRAM;  Surgeon: Runell Gess, MD;  Location: [facility] CATH LAB;  Service: Cardiovascular;  Laterality: N/A;  . lower arterial duplex  06/20/10   abi's normal,rgt 0.98,lft 1.06;bilateral PVRs normal  . LUMBAR LAMINECTOMY/DECOMPRESSION MICRODISCECTOMY Left 01/06/2013   Procedure: MICRO LUMBAR DECOMPRESSION L4-5 AND L5-S1;  Surgeon: Javier Docker, MD;  Location: [facility];  Service: Orthopedics;  Laterality: Left;  . LUMBAR LAMINECTOMY/DECOMPRESSION MICRODISCECTOMY Left 10/12/2014   Procedure:  REVISION MICRO LUMBAR/DECOMPRESSION L4-5 LEFT ;  Surgeon: Jene Every, MD;  Location: [facility];  Service: Orthopedics;  Laterality: Left;  . NM MYOCAR PERF WALL MOTION  09/22/2008   lexiscan-EF 83%; glogal LV systolic fx is norm. ,evidence of mild ischemia basal anterior,midanterior and apical lateral region(s).   . ORIF PATELLA Right 08/29/2016   Procedure: OPEN REDUCTION INTERNAL (ORIF) FIXATION RIGHT PATELLA;  Surgeon: Samson Frederic, MD;  Location: [facility];  Service: Orthopedics;  Laterality: Right;  Adductor Block  . TUBAL LIGATION    . TYMPANOPLASTY Bilateral   . UVULOPALATOPHARYNGOPLASTY     pt denies     Social History    Tobacco Use  . Smoking status: Never Smoker  . Smokeless tobacco: Never Used  Substance Use Topics  . Alcohol use: Yes    Comment: occasional, 1 a month wine  . Drug use: No    Family History  Problem Relation Age of Onset  . Coronary artery disease Mother   . Rheum arthritis Mother   . Dementia Mother   . Heart attack Father   . Hypertension Father   . Hyperlipidemia Father   . Other Father        MVA  . Hypertension Brother   . Cancer Brother   . Heart disease Brother   . Cancer Paternal Grandmother        stomach  . Diabetes Paternal Grandfather   . Breast cancer Neg Hx     Allergies  Allergen Reactions  . Niacin And Related Other (See Comments)    Whelps and skin flushed, mouth tingling  . Tolectin [Tolmetin] Rash    Medication list has been reviewed and updated.  Current Outpatient Medications on File Prior to Visit  Medication Sig Dispense Refill  . acetaminophen (TYLENOL) 500 MG tablet Take 500 mg by mouth every 6 (six) hours as needed for mild pain or headache.    Marland Kitchen aspirin EC 81 MG EC tablet Take 1 tablet (81 mg total) by mouth daily. 30 tablet 1  . calcium carbonate (OS-CAL) 1250 (500 Ca) MG chewable tablet Chew 1 tablet by mouth daily.    . cetirizine (ZYRTEC) 10 MG chewable tablet Chew 10 mg by mouth daily as needed for allergies.    . cholecalciferol (VITAMIN D) 1000 UNITS tablet Take 1,000 Units by mouth daily.    . clopidogrel (PLAVIX) 75 MG tablet TAKE 1 TABLET BY MOUTH DAILY. 90 tablet 3  . diazepam (VALIUM) 2 MG tablet TAKE 1 TABLET BY MOUTH EVERY 12 hours as needed for anxiety 40 tablet 1  . famotidine (PEPCID) 20 MG tablet TAKE 1 TABLET BY MOUTH 2 TIMES DAILY. 180 tablet 1  . FLUoxetine (PROZAC) 20 MG capsule TAKE 1 CAPSULE (20 MG TOTAL) BY MOUTH DAILY. 30 capsule 2  . fluticasone (FLONASE) 50 MCG/ACT nasal spray Place 1 spray into both nostrils daily as needed for allergies or rhinitis.    Marland Kitchen meclizine (ANTIVERT) 25 MG tablet Take 25 mg by  mouth 3 (three) times daily as needed for dizziness.    . metFORMIN (GLUCOPHAGE-XR) 500 MG 24 hr tablet Take 1 tablet (500 mg total) by mouth daily with breakfast. 90 tablet 3  . metoprolol succinate (TOPROL-XL) 25 MG 24 hr tablet TAKE 1/2 TABLET BY MOUTH DAILY 45 tablet 3  . Multiple Vitamin (MULTIVITAMIN WITH MINERALS) TABS tablet Take 1 tablet by mouth daily.    . nitroGLYCERIN (NITROSTAT) 0.4 MG SL tablet Place 1 tablet (0.4 mg total) under the tongue  every 5 (five) minutes x 3 doses as needed for chest pain. 25 tablet 3  . ondansetron (ZOFRAN) 4 MG tablet Take 1 tablet (4 mg total) by mouth every 8 (eight) hours as needed. 12 tablet 0  . simvastatin (ZOCOR) 40 MG tablet Take 1 tablet (40 mg total) by mouth every morning.    Marland Kitchen. telmisartan (MICARDIS) 40 MG tablet Take 1 tablet (40 mg total) by mouth every morning. 90 tablet 3   No current facility-administered medications on file prior to visit.     Review of Systems:  As per HPI- otherwise negative.   Physical Examination: Vitals:   07/09/17 1103  BP: 130/78  Pulse: 81  Temp: 97.8 F (36.6 C)  SpO2: 96%   Vitals:   07/09/17 1103  Weight: 211 lb 12.8 oz (96.1 kg)  Height: 5\' 1"  (1.549 m)   Body mass index is 40.02 kg/m. Ideal Body Weight: Weight in (lb) to have BMI = 25: 132  GEN: WDWN, NAD, Non-toxic, A & O x 3, obese, looks well and more like her normal self from a year ago  HEENT: Atraumatic, Normocephalic. Neck supple. No masses, No LAD. Ears and Nose: No external deformity. CV: RRR, No M/G/R. No JVD. No thrill. No extra heart sounds. PULM: CTA B, no wheezes, crackles, rhonchi. No retractions. No resp. distress. No accessory muscle use. EXTR: No c/c/e NEURO still stiff in her right leg but her gait appears more confident and she is walking unassisted today PSYCH: Normally interactive. Conversant. Not depressed or anxious appearing.  Calm demeanor.    Assessment and Plan: Mild anemia - Plan: CBC  Just checking on  her hg today- will be in touch with this report   Signed Abbe AmsterdamJessica Copland, MD Received her labs today 4/19- message to pt Negative FOBT earlier this month   Your red cell count (hemoglobin) is still a bit low = minimal anemia. I tend to think that this is benign variation- looking back, your hemoglobin has been on the lower side in years past.  We worry about this a bit because anemia can be a sign of slow blood loss from the colon- not enough that you would be able to see it in your stool.   You had a negative occult blood test on your stool just a week or so ago which is reassuring- also I think your last colonoscopy was about 4 years ago.  Overall I think the chance of you minimal anemia representing something serious is low.  However, if you would like we can have you consult with your GI doctor. otherwise, I would like to check this again in about 4 months.  Let me know if you prefer to see GI now.    Minimally high white blood cell count is also likely benign, we will repeat when we do your blood counts in 4 months  Results for orders placed or performed in visit on 07/09/17  CBC  Result Value Ref Range   WBC 11.2 (H) 4.0 - 10.5 K/uL   RBC 4.57 3.87 - 5.11 Mil/uL   Platelets 357.0 150.0 - 400.0 K/uL   Hemoglobin 11.6 (L) 12.0 - 15.0 g/dL   HCT 16.136.4 09.636.0 - 04.546.0 %   MCV 79.5 78.0 - 100.0 fl   MCHC 32.0 30.0 - 36.0 g/dL   RDW 40.916.2 (H) 81.111.5 - 91.415.5 %

## 2017-07-09 ENCOUNTER — Ambulatory Visit (INDEPENDENT_AMBULATORY_CARE_PROVIDER_SITE_OTHER): Payer: PPO | Admitting: Family Medicine

## 2017-07-09 ENCOUNTER — Encounter: Payer: Self-pay | Admitting: Family Medicine

## 2017-07-09 VITALS — BP 130/78 | HR 81 | Temp 97.8°F | Ht 61.0 in | Wt 211.8 lb

## 2017-07-09 DIAGNOSIS — D649 Anemia, unspecified: Secondary | ICD-10-CM | POA: Diagnosis not present

## 2017-07-09 LAB — CBC
HCT: 36.4 % (ref 36.0–46.0)
HEMOGLOBIN: 11.6 g/dL — AB (ref 12.0–15.0)
MCHC: 32 g/dL (ref 30.0–36.0)
MCV: 79.5 fl (ref 78.0–100.0)
Platelets: 357 10*3/uL (ref 150.0–400.0)
RBC: 4.57 Mil/uL (ref 3.87–5.11)
RDW: 16.2 % — ABNORMAL HIGH (ref 11.5–15.5)
WBC: 11.2 10*3/uL — ABNORMAL HIGH (ref 4.0–10.5)

## 2017-07-11 ENCOUNTER — Encounter: Payer: Self-pay | Admitting: Family Medicine

## 2017-07-21 ENCOUNTER — Ambulatory Visit: Payer: PPO | Admitting: Internal Medicine

## 2017-07-21 ENCOUNTER — Encounter: Payer: Self-pay | Admitting: Internal Medicine

## 2017-07-21 VITALS — BP 144/60 | HR 73 | Ht 61.0 in | Wt 212.0 lb

## 2017-07-21 DIAGNOSIS — F419 Anxiety disorder, unspecified: Secondary | ICD-10-CM | POA: Insufficient documentation

## 2017-07-21 DIAGNOSIS — I2510 Atherosclerotic heart disease of native coronary artery without angina pectoris: Secondary | ICD-10-CM | POA: Diagnosis not present

## 2017-07-21 DIAGNOSIS — Z9861 Coronary angioplasty status: Secondary | ICD-10-CM | POA: Diagnosis not present

## 2017-07-21 DIAGNOSIS — G4733 Obstructive sleep apnea (adult) (pediatric): Secondary | ICD-10-CM | POA: Diagnosis not present

## 2017-07-21 DIAGNOSIS — E785 Hyperlipidemia, unspecified: Secondary | ICD-10-CM

## 2017-07-21 DIAGNOSIS — Z9989 Dependence on other enabling machines and devices: Secondary | ICD-10-CM | POA: Diagnosis not present

## 2017-07-21 NOTE — Patient Instructions (Signed)
Your physician recommends that you return for lab work FASTING to check cholesterol   Your physician wants you to follow-up in: ONE YEAR with Dr. Hilty. You will receive a reminder letter in the mail two months in advance. If you don't receive a letter, please call our office to schedule the follow-up appointment.   

## 2017-07-21 NOTE — Progress Notes (Signed)
Date:  07/21/2017   ID:  [name], DOB [birth year], MRN [id number]  PCP:  Pearline Cables, MD  Primary Cardiologist:  Briena Swingler  CC:  No cardiac complaints   History of Present Illness: [name] is a 67 y.o. female with a history of CAD with DES stent to prox. LAD in 2010 with residual disease in LCX 10-20% and RCA 20% lesion.  She also has HTN, DM, dyslipidemia and obesity.  She presented on 10/01/12 to the ED with c/o chest pressure. Discomfort began at work and associated with flushing symptoms secondary to niacin. No DOE, SOB, nausea or diaphoresis. She then developed chest pressure and nausea and went to Urgent care. She thought the flushing was secondary to stopping ASA. She was given ASA, cooled down and then had chest presssure. Sent to [facility] ER and NTG SL did resolve the pressure. She was very concerned about the pressure because it was the same that she had with her Lexiscan in 2010 that lead to her stent.  Coronary angiogram revealed widely patent proximal LAD stent with otherwise normal coronary arteries and normal LV function.  [name] recently underwent back surgery and is doing fairly well. She still is having some back pain but is adamant about not taking medication. She plans to go back to work on Monday next week. Unfortunately she is still grieving from her brother who passed away last week. He had stage IV pancreatic cancer and lived less than one year.  [name] returns today for follow-up. Overall she denies any chest pain or worsening shortness of breath. Recently she's been having some palpitations, however she attributes that to stress. She recently is been placed on CPAP and seems to be tolerating that well. She's had some problems with memory loss but neurologic workup is been unremarkable. She recently had worsening back problems and has been to see Dr. Shelle Iron. He is considering surgery on her back but would need her to come off of her antiplatelets  medications.  At the pleasure seeing [name] back today in follow-up. She underwent successful back surgery and is doing better. She's managed to lose significant amount of weight. She's remained on Toprol which was started preoperatively. Her surgery was without complication. Recently she's had some dizziness with change in position. This could be related to dietary changes however I did note that her diastolic pressure is low today at 56. She denies any chest pain or worsening shortness of breath.  02/12/2016  [name] returns today for follow-up. She is recently been having some more palpitations. She reports being under significant stress as her daughter and granddaughter are now living with them. She gets some fullness in her chest when she has palpitations. These episodes occur 2-3 times a week over the past month or so. She denies any exertional chest pain or worsening shortness of breath. Weight is Morrie Sheldon down 3 pounds since her last office visit. Blood pressure is well-controlled. She recently started Celebrex and I spoke with her primary care provider about this. She needs to be careful about taking it regularly in the setting of aspirin and Plavix use.  03/07/2016  I saw [name] back today for follow-up of her monitor. She reports she had 2 episodes of palpitations and some elevated blood pressure on the day of Thanksgiving. She was dealing with her sister who had recently had a brain bleed and had to take her to the emergency room. She apparently had some seizures.  She was also trying to cook Thanksgiving meals and felt overwhelmed. She did take some Valium with some benefit however one point said her blood pressure was up to 210/100. Since then she's felt much better. We did not to capture any episodes of arrhythmias, extrasystoles or anything concerning on her monitor. I suspect her symptoms are related to stress and anxiety. Blood pressure is fairly well-controlled  today.  10/02/2016  [name] returns today for follow-up. Unfortunate she recently broke her leg. She says she has a problem with some foot drag and she misstepped. She had have surgery and is still wearing a brace in the right leg. She is improving and has better range of motion. Blood pressure is good today 138/62. She reports recently she's been having some dizziness. Sometimes positional with sitting up at other times not positional. She is afraid of falling. She's been very anxious. In fact she's been taking Valium although she says only a few times a week. She does not think that is related to either the Valium or her pain medicine. She is concerned it could be her blood pressure although has not noted any low blood pressure readings.  07/21/2017  [name] was seen today in follow-up.  She is without any cardiac complaints.  In September she was hospitalized with chest pain symptoms.  She ruled out for MI and underwent stress testing which was low risk.  Subsequently she was noted to have significant anxiety, some degree of agoraphobia, and fear of walking by herself.  She was started on fluoxetine and has been referred to a psychologist.  She is also undergone some cognitive behavioral therapy which has been helpful.  Wt Readings from Last 3 Encounters:  07/21/17 212 lb (96.2 kg)  07/09/17 211 lb 12.8 oz (96.1 kg)  06/18/17 208 lb 6.4 oz (94.5 kg)     Past Medical History:  Diagnosis Date  . Anemia   . Anginal pain (HCC)    pt relates to stress pt was seen by Dr Rennis Golden 3 wks ago was given metoprolol   . Bronchitis    hx of   . CAD (coronary artery disease)    PCI to LAD in 2010, mild residual disease in LCX and RCA  . Cataracts, bilateral   . Diabetes mellitus without complication (HCC)   . Family history of adverse reaction to anesthesia    pts mother had difficulty awakening   . GERD (gastroesophageal reflux disease)   . Hyperlipidemia LDL goal < 70   . Hypertension   . Lumbar back  pain   . Numbness    left leg and foot   . Plantar fascia rupture    Left Foot  . Sleep apnea    uses CPAP     Current Outpatient Medications  Medication Sig Dispense Refill  . acetaminophen (TYLENOL) 500 MG tablet Take 500 mg by mouth every 6 (six) hours as needed for mild pain or headache.    Marland Kitchen aspirin EC 81 MG EC tablet Take 1 tablet (81 mg total) by mouth daily. 30 tablet 1  . calcium carbonate (OS-CAL) 1250 (500 Ca) MG chewable tablet Chew 1 tablet by mouth daily.    . cetirizine (ZYRTEC) 10 MG chewable tablet Chew 10 mg by mouth daily as needed for allergies.    . cholecalciferol (VITAMIN D) 1000 UNITS tablet Take 1,000 Units by mouth daily.    . clopidogrel (PLAVIX) 75 MG tablet TAKE 1 TABLET BY MOUTH DAILY. 90 tablet 3  .  diazepam (VALIUM) 2 MG tablet TAKE 1 TABLET BY MOUTH EVERY 12 hours as needed for anxiety 40 tablet 1  . famotidine (PEPCID) 20 MG tablet TAKE 1 TABLET BY MOUTH 2 TIMES DAILY. 180 tablet 1  . FLUoxetine (PROZAC) 20 MG capsule TAKE 1 CAPSULE (20 MG TOTAL) BY MOUTH DAILY. 30 capsule 2  . fluticasone (FLONASE) 50 MCG/ACT nasal spray Place 1 spray into both nostrils daily as needed for allergies or rhinitis.    . metFORMIN (GLUCOPHAGE-XR) 500 MG 24 hr tablet Take 1 tablet (500 mg total) by mouth daily with breakfast. 90 tablet 3  . metoprolol succinate (TOPROL-XL) 25 MG 24 hr tablet TAKE 1/2 TABLET BY MOUTH DAILY 45 tablet 3  . Multiple Vitamin (MULTIVITAMIN WITH MINERALS) TABS tablet Take 1 tablet by mouth daily.    . nitroGLYCERIN (NITROSTAT) 0.4 MG SL tablet Place 1 tablet (0.4 mg total) under the tongue every 5 (five) minutes x 3 doses as needed for chest pain. 25 tablet 3  . ondansetron (ZOFRAN) 4 MG tablet Take 1 tablet (4 mg total) by mouth every 8 (eight) hours as needed. 12 tablet 0  . simvastatin (ZOCOR) 40 MG tablet Take 1 tablet (40 mg total) by mouth every morning.    Marland Kitchen telmisartan (MICARDIS) 40 MG tablet Take 1 tablet (40 mg total) by mouth every  morning. 90 tablet 3   No current facility-administered medications for this visit.     Allergies:    Allergies  Allergen Reactions  . Niacin And Related Other (See Comments)    [place]helps and skin flushed, mouth tingling  . Tolectin [Tolmetin] Rash    Social History:  The patient  reports that she has never smoked. She has never used smokeless tobacco. She reports that she drinks alcohol. She reports that she does not use drugs.   Family history:   Family History  Problem Relation Age of Onset  . Coronary artery disease Mother   . Rheum arthritis Mother   . Dementia Mother   . Heart attack Father   . Hypertension Father   . Hyperlipidemia Father   . Other Father        MVA  . Hypertension Brother   . Cancer Brother   . Heart disease Brother   . Cancer Paternal Grandmother        stomach  . Diabetes Paternal Grandfather   . Breast cancer Neg Hx     ROS: Pertinent items noted in HPI and remainder of comprehensive ROS otherwise negative.   PHYSICAL EXAM: VS:  BP (!) 144/60   Pulse 73   Ht  (1.549 m)   Wt 212 lb (96.2 kg)   LMP  (LMP Unknown)   BMI 40.06 kg/m  General appearance: alert, no distress and morbidly obese Neck: no carotid bruit, no JVD and thyroid not enlarged, symmetric, no tenderness/mass/nodules Lungs: clear to auscultation bilaterally Heart: regular rate and rhythm Abdomen: soft, non-tender; bowel sounds normal; no masses,  no organomegaly Extremities: Right leg in an immobilizer Pulses: 2+ and symmetric Skin: Skin color, texture, turgor normal. No rashes or lesions Neurologic: Grossly normal Psych: Mildly anxious  EKG: Normal sinus rhythm at 73, RBBB-personally reviewed  ASSESSMENT: 1. New RBBB 2. Coronary artery disease status post DES the proximal LAD in 2010, low risk Myoview (11/2016)-LVEF 78% 3. Hypertension 4. Dyslipidemia 5. Obesity 6. Type 2 diabetes 7. Palpitations 8. OSA on CPAP 9. Anxiety  PLAN:  1.  Mrs. Besser is  asymptomatic, denying any chest  pain or worsening shortness of breath.  She had a negative stress test in September 2018.  EKG does show a new right bundle branch block today.  We will continue to monitor this.  Her anxiety has improved on fluoxetine and her agoraphobia has as well.  I encouraged her to continue to work with the therapist on her anxiety issues.  We will repeat a lipid profile today.  Follow-up with me annually or sooner as necessary.  Chrystie Nose, MD, [facility], FACP  [place]  [facility]  Medical Director of the [facility]  [facility] Diplomate of the American Board of Clinical Lipidology Attending Cardiologist  Direct Dial: [phone]  Fax: [phone]  Website:  [email or web address]

## 2017-07-22 DIAGNOSIS — M24661 Ankylosis, right knee: Secondary | ICD-10-CM | POA: Diagnosis not present

## 2017-07-22 DIAGNOSIS — M25562 Pain in left knee: Secondary | ICD-10-CM | POA: Diagnosis not present

## 2017-07-22 DIAGNOSIS — S82001D Unspecified fracture of right patella, subsequent encounter for closed fracture with routine healing: Secondary | ICD-10-CM | POA: Diagnosis not present

## 2017-07-29 ENCOUNTER — Other Ambulatory Visit: Payer: Self-pay | Admitting: Family Medicine

## 2017-07-29 MED FILL — TELMISARTAN 40 MG TABLET: 40 | 90 days supply | Qty: 90 | Fill #0

## 2017-07-30 MED FILL — FLUoxetine HCL 20 MG CAPS: 20 | 30 days supply | Qty: 30 | Fill #0

## 2017-08-04 MED FILL — METOPROLOL SUCCINATE ER 25: 25 | 90 days supply | Qty: 45 | Fill #3

## 2017-08-25 ENCOUNTER — Ambulatory Visit (INDEPENDENT_AMBULATORY_CARE_PROVIDER_SITE_OTHER): Payer: PPO | Admitting: Diagnostic Neuroimaging

## 2017-08-25 ENCOUNTER — Encounter: Payer: Self-pay | Admitting: Diagnostic Neuroimaging

## 2017-08-25 VITALS — BP 135/66 | HR 54 | Ht 61.0 in | Wt 212.0 lb

## 2017-08-25 DIAGNOSIS — F0781 Postconcussional syndrome: Secondary | ICD-10-CM | POA: Diagnosis not present

## 2017-08-25 MED FILL — FLUoxetine HCL 20 MG CAPS: 20 | 30 days supply | Qty: 30 | Fill #1

## 2017-08-25 NOTE — Progress Notes (Signed)
[facility]  PATIENT: [name] DOB: 23-Feb-1951  REFERRING CLINICIAN: Copeland HISTORY FROM: patient and husband REASON FOR VISIT: follow up   HISTORICAL  CHIEF COMPLAINT:  Chief Complaint  Patient presents with  . Post concussion syndrome    rm 7, husband- [name], " not getting headaches as often; usually wake up with dull headache, blurriness of vision with nausea, light sensitivity; not on CPAP- insurance quit paying"  . Follow-up    HISTORY OF PRESENT ILLNESS:   UPDATE (08/25/17, VRP): Since last visit, doing well. Dizziness, lightheadedness have improved but not fully resolved. Seems to be affected with overthinking, anxiety and rapid movements. Does better when she is focused on other activities. Walking is better. Driving on limited basis now. Overall doing well.   UPDATE (01/08/17, VRP): Since last visit, was doing well until Aug 15, 2016 --> fell and hit head and broke right knee. Now with headaches, anxiety, nausea issues. Now on prozac and feeling a little better.  UPDATE 09/09/14: Since last visit, doing better with memory and sleep apnea treatments. Having more issues with left foot/toe drop and back pain --> has MRI lumbar spine setup via ortho for reevaluation of known lumbar radiculopathy.   UPDATE 03/01/14: Since last visit, feeling better. Still with some stress, memory loss, but getting better. Had sleep study and dx'd with sleep apnea, planning to have titration study in Jan 2016 for CPAP.  PRIOR HPI (12/20/13): 67 year old right-handed female here for a list of memory loss. Patient noticed memory problems since February 24, 2013. Husband noticed problems since February 2015. Patient having short-term memory problems, poor concentration attention, in the setting of significantly increased psychosocial stressors. She has had more trouble driving and cooking recently.  Patient was taking care of her brother for the past one year who was diagnosed with  pancreatic cancer. He deteriorated and ultimately passed away in 02-24-2014. Patient placed some blame on herself for his death, related to recommending morphine drip. During his funeral, patient stepped into a hole in the ground and developed left foot, ankle injury and plantar fascia rupture. Related to this patient was unable to work. She had to have premature retirement. This contributed to increasing stress, depression and anxiety. Patient also had a fall at the beats with head trauma, low back pain and disc bulging. Patient had lumbar spine surgery, L4, L5, S1 microdecompression 2014. Patient has family history of dementia in her mother and father.    REVIEW OF SYSTEMS: Full 14 system review of systems performed and negative except: memory loss sleepiness blurred vision tremor.     ALLERGIES: Allergies  Allergen Reactions  . Niacin And Related Other (See Comments)    Whelps and skin flushed, mouth tingling  . Tolectin [Tolmetin] Rash    HOME MEDICATIONS: Outpatient Medications Prior to Visit  Medication Sig Dispense Refill  . acetaminophen (TYLENOL) 500 MG tablet Take 500 mg by mouth every 6 (six) hours as needed for mild pain or headache.    Marland Kitchen aspirin EC 81 MG EC tablet Take 1 tablet (81 mg total) by mouth daily. 30 tablet 1  . calcium carbonate (OS-CAL) 1250 (500 Ca) MG chewable tablet Chew 1 tablet by mouth daily.    . cetirizine (ZYRTEC) 10 MG chewable tablet Chew 10 mg by mouth daily as needed for allergies.    . cholecalciferol (VITAMIN D) 1000 UNITS tablet Take 1,000 Units by mouth daily.    . clopidogrel (PLAVIX) 75 MG tablet TAKE 1 TABLET BY  MOUTH DAILY. 90 tablet 3  . diazepam (VALIUM) 2 MG tablet TAKE 1 TABLET BY MOUTH EVERY 12 hours as needed for anxiety 40 tablet 1  . famotidine (PEPCID) 20 MG tablet TAKE 1 TABLET BY MOUTH 2 TIMES DAILY. 180 tablet 1  . FLUoxetine (PROZAC) 20 MG capsule TAKE 1 CAPSULE BY MOUTH ONCE DAILY 30 capsule 2  . fluticasone (FLONASE) 50 MCG/ACT  nasal spray Place 1 spray into both nostrils daily as needed for allergies or rhinitis.    . metFORMIN (GLUCOPHAGE-XR) 500 MG 24 hr tablet Take 1 tablet (500 mg total) by mouth daily with breakfast. 90 tablet 3  . metoprolol succinate (TOPROL-XL) 25 MG 24 hr tablet TAKE 1/2 TABLET BY MOUTH DAILY 45 tablet 3  . Multiple Vitamin (MULTIVITAMIN WITH MINERALS) TABS tablet Take 1 tablet by mouth daily.    . nitroGLYCERIN (NITROSTAT) 0.4 MG SL tablet Place 1 tablet (0.4 mg total) under the tongue every 5 (five) minutes x 3 doses as needed for chest pain. 25 tablet 3  . ondansetron (ZOFRAN) 4 MG tablet Take 1 tablet (4 mg total) by mouth every 8 (eight) hours as needed. 12 tablet 0  . simvastatin (ZOCOR) 40 MG tablet Take 1 tablet (40 mg total) by mouth every morning.    Marland Kitchen telmisartan (MICARDIS) 40 MG tablet Take 1 tablet (40 mg total) by mouth every morning. 90 tablet 3   No facility-administered medications prior to visit.     PAST MEDICAL HISTORY: Past Medical History:  Diagnosis Date  . Anemia   . Anginal pain (HCC)    pt relates to stress pt was seen by Dr Rennis Golden 3 wks ago was given metoprolol   . Bronchitis    hx of   . CAD (coronary artery disease)    PCI to LAD in 2010, mild residual disease in LCX and RCA  . Cataracts, bilateral   . Diabetes mellitus without complication (HCC)   . Family history of adverse reaction to anesthesia    pts mother had difficulty awakening   . GERD (gastroesophageal reflux disease)   . Hyperlipidemia LDL goal < 70   . Hypertension   . Lumbar back pain   . Numbness    left leg and foot   . Plantar fascia rupture    Left Foot  . Sleep apnea    uses CPAP     PAST SURGICAL HISTORY: Past Surgical History:  Procedure Laterality Date  . ABDOMINAL HYSTERECTOMY    . BREAST CYST ASPIRATION  1995  . CAROTID STENT  2009   pt denies   . CORONARY ANGIOPLASTY WITH STENT PLACEMENT  2010and 10-02-2012   Stent DES, Xience to prox. LAD  . DOPPLER  ECHOCARDIOGRAPHY  08/01/2009   EF=>55%,LV normal  . LEFT HEART CATHETERIZATION WITH CORONARY ANGIOGRAM N/A 10/02/2012   Procedure: LEFT HEART CATHETERIZATION WITH CORONARY ANGIOGRAM;  Surgeon: Runell Gess, MD;  Location: [facility] CATH LAB;  Service: Cardiovascular;  Laterality: N/A;  . lower arterial duplex  06/20/10   abi's normal,rgt 0.98,lft 1.06;bilateral PVRs normal  . LUMBAR LAMINECTOMY/DECOMPRESSION MICRODISCECTOMY Left 01/06/2013   Procedure: MICRO LUMBAR DECOMPRESSION L4-5 AND L5-S1;  Surgeon: Javier Docker, MD;  Location: [facility];  Service: Orthopedics;  Laterality: Left;  . LUMBAR LAMINECTOMY/DECOMPRESSION MICRODISCECTOMY Left 10/12/2014   Procedure: REVISION MICRO LUMBAR/DECOMPRESSION L4-5 LEFT ;  Surgeon: Jene Every, MD;  Location: [facility];  Service: Orthopedics;  Laterality: Left;  . NM MYOCAR PERF WALL MOTION  09/22/2008   lexiscan-EF 83%;  glogal LV systolic fx is norm. ,evidence of mild ischemia basal anterior,midanterior and apical lateral region(s).   . ORIF PATELLA Right 08/29/2016   Procedure: OPEN REDUCTION INTERNAL (ORIF) FIXATION RIGHT PATELLA;  Surgeon: Samson Frederic, MD;  Location: [facility];  Service: Orthopedics;  Laterality: Right;  Adductor Block  . TUBAL LIGATION    . TYMPANOPLASTY Bilateral   . UVULOPALATOPHARYNGOPLASTY     pt denies     FAMILY HISTORY: Family History  Problem Relation Age of Onset  . Coronary artery disease Mother   . Rheum arthritis Mother   . Dementia Mother   . Heart attack Father   . Hypertension Father   . Hyperlipidemia Father   . Other Father        MVA  . Hypertension Brother   . Cancer Brother   . Heart disease Brother   . Cancer Paternal Grandmother        stomach  . Diabetes Paternal Grandfather   . Breast cancer Neg Hx     SOCIAL HISTORY:  Social History   Socioeconomic History  . Marital status: Married    Spouse name: [name]  . Number of children: 2  . Years of education: MSN  . Highest education level: Not on  file  Occupational History  . Occupation: Chemical engineer: [employer]  Social Needs  . Financial resource strain: Not on file  . Food insecurity:    Worry: Not on file    Inability: Not on file  . Transportation needs:    Medical: Not on file    Non-medical: Not on file  Tobacco Use  . Smoking status: Never Smoker  . Smokeless tobacco: Never Used  Substance and Sexual Activity  . Alcohol use: Yes    Comment: occasional, 1 a month wine  . Drug use: No  . Sexual activity: Yes  Lifestyle  . Physical activity:    Days per week: Not on file    Minutes per session: Not on file  . Stress: Not on file  Relationships  . Social connections:    Talks on phone: Not on file    Gets together: Not on file    Attends religious service: Not on file    Active member of club or organization: Not on file    Attends meetings of clubs or organizations: Not on file    Relationship status: Not on file  . Intimate partner violence:    Fear of current or ex partner: Not on file    Emotionally abused: Not on file    Physically abused: Not on file    Forced sexual activity: Not on file  Other Topics Concern  . Not on file  Social History Narrative      Married to [place]. Education: [employer]. Exercise: Yes   Patient lives at home with her spouse. retired   Caffeine use: 1 drink of tea a day     PHYSICAL EXAM  Vitals:   08/25/17 0815  BP: 135/66  Pulse: (!) 54  Weight: 212 lb (96.2 kg)  Height: 5\' 1"  (1.549 m)    Not recorded      Body mass index is 40.06 kg/m.   MMSE - Mini Mental State Exam 12/20/2013  Orientation to time 5  Orientation to Place 5  Registration 3  Attention/ Calculation 5  Recall 3  Language- name 2 objects 2  Language- repeat 1  Language- follow 3 step command 3  Language- read & follow  direction 1  Write a sentence 1  Copy design 1  Total score 30    Montreal Cognitive Assessment  09/09/2014 03/01/2014 12/20/2013  Visuospatial/ Executive  (0/5) 3 2 2   Naming (0/3) 3 3 3   Attention: Read list of digits (0/2) 2 2 2   Attention: Read list of letters (0/1) 1 1 1   Attention: Serial 7 subtraction starting at 100 (0/3) 3 3 3   Language: Repeat phrase (0/2) 2 1 2   Language : Fluency (0/1) 1 1 0  Abstraction (0/2) 2 2 1   Delayed Recall (0/5) 2 3 4   Orientation (0/6) 6 6 6   Total 25 24 24   Adjusted Score (based on education) 25 24 24     GENERAL EXAM: Patient is in no distress; well developed, nourished and groomed; neck is supple  CARDIOVASCULAR: Regular rate and rhythm, no murmurs, no carotid bruits  NEUROLOGIC: MENTAL STATUS: awake, alert, language fluent, comprehension intact, naming intact, fund of knowledge appropriate; NO FRONTAL RELEASE SIGNS.   CRANIAL NERVE: no papilledema on fundoscopic exam, pupils equal and reactive to light, visual fields full to confrontation, extraocular muscles intact, no nystagmus, facial sensation and strength symmetric, hearing intact, palate elevates symmetrically, uvula midline, shoulder shrug symmetric, tongue midline. MOTOR: normal bulk and tone, full strength in the BUE, BLE SENSORY: normal and symmetric to light touch, temperature,  COORDINATION: finger-nose-finger, fine finger movements normal REFLEXES: deep tendon reflexes present and symmetric GAIT/STATION: narrow based gait; MILD LIMPING ON RIGHT KNEE    DIAGNOSTIC DATA (LABS, IMAGING, TESTING) - I reviewed patient records, labs, notes, testing and imaging myself where available.  Lab Results  Component Value Date   WBC 11.2 (H) 07/09/2017   HGB 11.6 (L) 07/09/2017   HCT 36.4 07/09/2017   MCV 79.5 07/09/2017   PLT 357.0 07/09/2017      Component Value Date/Time   NA 137 06/12/2017 1905   K 3.9 06/12/2017 1905   CL 102 06/12/2017 1905   CO2 23 06/12/2017 1905   GLUCOSE 108 (H) 06/12/2017 1905   BUN 7 06/12/2017 1905   CREATININE 0.79 06/12/2017 1905   CREATININE 0.82 09/29/2014 0910   CALCIUM 10.1 06/12/2017 1905    PROT 7.6 06/12/2017 1905   ALBUMIN 4.3 06/12/2017 1905   AST 25 06/12/2017 1905   ALT 19 06/12/2017 1905   ALKPHOS 124 06/12/2017 1905   BILITOT 0.6 06/12/2017 1905   GFRNONAA >60 06/12/2017 1905   GFRAA >60 06/12/2017 1905   Lab Results  Component Value Date   CHOL 140 12/12/2016   HDL 29 (L) 12/12/2016   LDLCALC 69 12/12/2016   LDLDIRECT 102.0 07/18/2016   TRIG 211 (H) 12/12/2016   CHOLHDL 4.8 12/12/2016   Lab Results  Component Value Date   HGBA1C 6.7 (H) 04/21/2017   Lab Results  Component Value Date   VITAMINB12 924 12/20/2013   Lab Results  Component Value Date   TSH 3.11 01/25/2016    I reviewed images myself and agree with interpretation. -VRP  12/07/12 CT head - Negative head CT.  12/07/12 CT cervical spine - No acute bony injury in the cervical spine.  12/29/13 MRI brain 1. Few subcortical and juxtacortical foci of non-specific gliosis, likely chronic small vessel ischemic disease.  2. No acute findings.  12/28/16 MRI brain  - No cause of the presenting symptoms is identified. No post traumatic finding. Normal except for a few punctate foci of T2 and FLAIR signal within the hemispheric white matter, probably subclinical.     ASSESSMENT  AND PLAN  67 y.o. year old female here with short term memory loss, poor attention/focus, since Nov 2014, in setting of increased personal stress (brother's illness and death, left foot injury, inability to work and premature retirement).   Then had fall and head injury in May 2018, with mild concussion and now post-concussion syndrome.    Dx:   1. Post concussion syndrome      PLAN:  I spent 15 minutes of face to face time with patient. Greater than 50% of time was spent in counseling and coordination of care with patient. In summary we discussed:   - continue to gradually increase activity - reviewed nutrition, fitness, sleep and relaxation techniques - follow up psychology evalulation   Return if symptoms  worsen or fail to improve, for return to PCP.     Suanne MarkerVIKRAM R. PENUMALLI, MD 08/25/2017, 8:39 AM Certified in Neurology, Neurophysiology and Neuroimaging  [facility] [address], [address] [place], [place] [address] [phone]

## 2017-08-26 ENCOUNTER — Encounter: Payer: Self-pay | Admitting: Family Medicine

## 2017-09-02 ENCOUNTER — Other Ambulatory Visit: Payer: Self-pay | Admitting: Family Medicine

## 2017-09-02 DIAGNOSIS — E119 Type 2 diabetes mellitus without complications: Secondary | ICD-10-CM

## 2017-09-02 MED FILL — CLOPIDOGREL 75 MG TABLET: 75 | 90 days supply | Qty: 90 | Fill #2

## 2017-09-02 MED FILL — METFORMIN HCL ER 500 MG TAB: 500 | 90 days supply | Qty: 90 | Fill #0

## 2017-09-29 ENCOUNTER — Ambulatory Visit (INDEPENDENT_AMBULATORY_CARE_PROVIDER_SITE_OTHER): Payer: PPO | Admitting: Psychology

## 2017-09-29 ENCOUNTER — Encounter

## 2017-09-29 ENCOUNTER — Encounter: Payer: Self-pay | Admitting: Psychology

## 2017-09-29 ENCOUNTER — Ambulatory Visit: Payer: PPO | Admitting: Psychology

## 2017-09-29 DIAGNOSIS — S060X0S Concussion without loss of consciousness, sequela: Secondary | ICD-10-CM

## 2017-09-29 DIAGNOSIS — R413 Other amnesia: Secondary | ICD-10-CM

## 2017-09-29 NOTE — Progress Notes (Signed)
   Neuropsychology Note  Leanna SatoHelen J Niederer completed 60 minutes of neuropsychological testing with technician, Wallace Kellerana Chamberlain, BS, under the supervision of Dr. Elvis CoilMaryBeth Bailar, Licensed Psychologist. The patient did not appear overtly distressed by the testing session, per behavioral observation or via self-report to the technician. Rest breaks were offered.   Clinical Decision Making: In considering the patient's current level of functioning, level of presumed impairment, nature of symptoms, emotional and behavioral responses during the interview, level of literacy, and observed level of motivation/effort, a battery of tests was selected and communicated to the psychometrician.  Communication between the psychologist and technician was ongoing throughout the testing session and changes were made as deemed necessary based on patient performance on testing, technician observations and additional pertinent factors such as those listed above.  Leanna SatoHelen J Lasker will return within approximately 2 weeks for an interactive feedback session with Dr. Alinda DoomsBailar at which time her test performances, clinical impressions and treatment recommendations will be reviewed in detail. The patient understands she can contact our office should she require our assistance before this time.  35 minutes spent performing neuropsychological evaluation services/clinical decision making (psychologist). [CPT 96132] 60 minutes spent face-to-face with patient administering standardized tests, 30 minutes spent scoring (technician). [CPT [id number], 96139]  Full report to follow.
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
## 2017-09-30 ENCOUNTER — Encounter: Payer: Self-pay | Admitting: Psychology

## 2017-10-03 ENCOUNTER — Other Ambulatory Visit: Payer: Self-pay | Admitting: Family Medicine

## 2017-10-03 DIAGNOSIS — E785 Hyperlipidemia, unspecified: Principal | ICD-10-CM

## 2017-10-03 DIAGNOSIS — E1169 Type 2 diabetes mellitus with other specified complication: Secondary | ICD-10-CM

## 2017-10-03 DIAGNOSIS — Z955 Presence of coronary angioplasty implant and graft: Secondary | ICD-10-CM

## 2017-10-03 MED FILL — FLUoxetine HCL 20 MG CAPS: 20 | 30 days supply | Qty: 30 | Fill #2

## 2017-10-03 MED FILL — FAMOTIDINE 20 MG TABLET: 20 | 90 days supply | Qty: 180 | Fill #1

## 2017-10-03 MED FILL — SIMVASTATIN 40 MG TABS: 40 | 90 days supply | Qty: 90 | Fill #0

## 2017-10-03 NOTE — Telephone Encounter (Signed)
Pharmacy notified.

## 2017-10-03 NOTE — Telephone Encounter (Signed)
[facility] pharm would like to clarify. The patient take simvastatin in evening not morning. Please call [phone]

## 2017-10-03 NOTE — Telephone Encounter (Signed)
Just ask them to make the sig take one daily- does not mater what time
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
## 2017-10-20 ENCOUNTER — Ambulatory Visit (INDEPENDENT_AMBULATORY_CARE_PROVIDER_SITE_OTHER): Payer: PPO | Admitting: Psychology

## 2017-10-20 ENCOUNTER — Encounter

## 2017-10-20 ENCOUNTER — Encounter: Payer: Self-pay | Admitting: Psychology

## 2017-10-20 DIAGNOSIS — S060X0S Concussion without loss of consciousness, sequela: Secondary | ICD-10-CM | POA: Diagnosis not present

## 2017-10-20 DIAGNOSIS — R413 Other amnesia: Secondary | ICD-10-CM

## 2017-10-21 NOTE — Progress Notes (Addendum)
[facility] [address], [address] [place], [place] [address] [phone]  Date:  10/23/2017   Name:  [name]   DOB:  Dec 06, 1950   MRN:  [id number]  PCP:  Pearline Cables, MD    Chief Complaint: Annual Exam (would like update from neurology)   History of Present Illness:  [name] is a 67 y.o. 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 After the concussion and leg injury/surgery in 2018, she stopped driving, cooking and managing many of the household tasks. She has returned to doing some cooking recently. She is just starting to get back to driving. She has driven two places with family members in the car and did fine. She manages her medications independently and usually does well with this but sometimes, especially if she is not at home, she will forget to take her AM medications until the afternoon. She manage the finances and bills. She has missed two bill payments in the past but generally does well. She manages her appointments and again generally does well with this but she did show up a week early for a recent appointment./////////////////////////////// Clinical Impressions: Mild neurocognitive disorder, etiology is likely multifactorial (vascular risk factors, untreated sleep apnea*, pain, anxiety). Results of cognitive testing were largely within normal limits. Auditory/verbal memory was an area of relative strength, which is reassuring. She did demonstrate relative weaknesses on tasks measuring visual memory  and mental flexibility/set shifting. Confrontation naming and verbal fluency were also mild relative weaknesses when considering level of education (master's degree), but were not impaired for age. Overall, her test results and current level of functioning do not indicate dementia, and I do not see any clear signs of prodromal Alzheimer's disease. I believe the patient's cognitive weaknesses and subjective cognitive complaints likely stem from other factors, including untreated sleep apnea, vascular risk factors, chronic pain and anxiety.  Recommendations/Plan: Based on the findings of the present evaluation, the following recommendations are offered: 1. I highly recommend she participate in psychotherapy/counseling to process complicated grief and trauma. Therapy would also help her learn cognitive-behavioral strategies to help manage anxiety, which would be particularly important if she decides to wean off Prozac at any point. 2. The patient is strongly encouraged to follow up with her PCP and/or Dr. Vickey Huger regarding sleep apnea. She reports that she had good compliance with CPAP in the past but when she changed insurance companies she had to return the CPAP and thus has been untreated for a while. Her husband states that she needs an updated sleep study in order for the new insurance plan to cover CPAP. She will follow up with her PCP and Dr. Vickey Huger to get this ordered. I explained that untreated sleep apnea can cause changes to white matter and cognitive function, but can be reversible with CPAP re-initiation and compliance. 3. Optimal control of vascular risk factors (eg diabetes) was also encouraged and I explained how this pertains to brain health.  4. I recommend neurocognitive re-evaluation in 1-2 years (after CPAP compliance)  to monitor cognitive status, track any change in symptoms and further assist with treatment planning.  She is is also seeing neurology, Dr. Marjory Lies- from their most  recent note in June: ASSESSMENT AND PLAN 67 y.o. year old female here with short term memory loss, poor attention/focus, since Nov 2014, in setting of increased personal stress (brother's illness and death, left foot injury, inability to work and premature retirement).  Then had fall and head injury in May 2018, with mild concussion and now post-concussion syndrome.   Her cardiologist is Dr. Rennis Golden- from his note in April: ASSESSMENT: 1. New RBBB 2. Coronary artery disease status post DES the proximal LAD in 2010, low risk Myoview (11/2016)-LVEF 78% 3. Hypertension 4. Dyslipidemia 5. Obesity 6. Type 2 diabetes 7. Palpitations 8. OSA on CPAP 9. Anxiety  PLAN: 1.  [name] is asymptomatic, denying any chest pain or worsening shortness of breath.  She had a negative stress test in September 2018.  EKG does show a new right bundle branch block today.  We will continue to monitor this.  Her anxiety has improved on fluoxetine and her agoraphobia has as well.  I encouraged her to continue to work with the therapist on her anxiety issues  I last saw he myself in April at which time she felt like she was doing well and getting over her concussion sx.  We did notice mild anemia and leukopenia at that visit, as per my lab notes: Your red cell count (hemoglobin) is still a bit low = minimal anemia. I tend to think that this is benign variation- looking back, your hemoglobin has been on the lower side in years past.  We worry about this a bit because anemia can be a sign of slow blood loss from the colon- not enough that you would be able to see it in your stool.   You had a negative occult blood test on your stool just a week or so ago which is reassuring- also I think your last colonoscopy was about 4 years ago.  Overall I think the chance of you minimal anemia representing something serious is low.  However, if you would like we can have you consult with your GI doctor. otherwise, I would like to check  this again in about 4 months.  Let me know if you prefer to see GI now.   Minimally high white blood cell count is also likely benign, we will repeat when we do your blood counts in 4 months  Labs: needs lipid panel, CBC, A1c Mammo: 3/18 Colon: 2015- Dr. Loreta Ave - called and requested copy of report today Dexa: 2017, osteopenia. Will repeat next year  Foot exam due today Offer shingrix   Lab Results  Component Value Date   HGBA1C 6.7 (H) 04/21/2017   Asa 81 plavix 75 Valium- does not need a refill today  pepcid prozac Metformin 500 daily toprol xl 25 zocor 40 micardis  She is back driving- she is even driving by herself now again! She has not gone back [place] [address] as of yet.   Overall both pt and her husband [name] who contributes to the history today feel that she is making progress and getting better overall. She is feeling more steady on her feet, more confident, and less anxious   She would like to see Dr. Algis Downs again to discuss OSA- she might need to be treated for OSA She is still taking her prozac at this time   Patient Active  Problem List   Diagnosis Date Noted  . Anxiety 07/21/2017  . Dizziness 10/02/2016  . Right patella fracture 08/29/2016  . Acquired foot deformity, left 07/18/2016  . Osteopenia 11/10/2015  . HNP (herniated nucleus pulposus), lumbar 10/12/2014  . Spinal stenosis at L4-L5 level 10/12/2014  . OSA on CPAP 07/29/2014  . Iron deficiency anemia 07/29/2014  . DM (diabetes mellitus) with complications (HCC) 07/29/2014  . Environmental and seasonal allergies 04/28/2014  . Spinal stenosis, lumbar region, with neurogenic claudication 01/06/2013  . Obesity, morbid, BMI 40.0-49.9 (HCC) 10/20/2012  . Chest pain with moderate risk of acute coronary syndrome 10/02/2012  . CAD S/P percutaneous coronary angioplasty   . Hyperlipidemia with target LDL less than 70   . Essential hypertension 04/16/2012    Past Medical History:  Diagnosis Date  . Anemia   .  Anginal pain (HCC)    pt relates to stress pt was seen by Dr Rennis Golden 3 wks ago was given metoprolol   . Bronchitis    hx of   . CAD (coronary artery disease)    PCI to LAD in 2010, mild residual disease in LCX and RCA  . Cataracts, bilateral   . Diabetes mellitus without complication (HCC)   . Family history of adverse reaction to anesthesia    pts mother had difficulty awakening   . GERD (gastroesophageal reflux disease)   . Hyperlipidemia LDL goal < 70   . Hypertension   . Lumbar back pain   . Numbness    left leg and foot   . Plantar fascia rupture    Left Foot  . Sleep apnea    uses CPAP     Past Surgical History:  Procedure Laterality Date  . ABDOMINAL HYSTERECTOMY    . BREAST CYST ASPIRATION  1995  . CAROTID STENT  2009   pt denies   . CORONARY ANGIOPLASTY WITH STENT PLACEMENT  2010and 10-02-2012   Stent DES, Xience to prox. LAD  . DOPPLER ECHOCARDIOGRAPHY  08/01/2009   EF=>55%,LV normal  . LEFT HEART CATHETERIZATION WITH CORONARY ANGIOGRAM N/A 10/02/2012   Procedure: LEFT HEART CATHETERIZATION WITH CORONARY ANGIOGRAM;  Surgeon: Runell Gess, MD;  Location: [facility] CATH LAB;  Service: Cardiovascular;  Laterality: N/A;  . lower arterial duplex  06/20/10   abi's normal,rgt 0.98,lft 1.06;bilateral PVRs normal  . LUMBAR LAMINECTOMY/DECOMPRESSION MICRODISCECTOMY Left 01/06/2013   Procedure: MICRO LUMBAR DECOMPRESSION L4-5 AND L5-S1;  Surgeon: Javier Docker, MD;  Location: [facility] ORS;  Service: Orthopedics;  Laterality: Left;  . LUMBAR LAMINECTOMY/DECOMPRESSION MICRODISCECTOMY Left 10/12/2014   Procedure: REVISION MICRO LUMBAR/DECOMPRESSION L4-5 LEFT ;  Surgeon: Jene Every, MD;  Location: [facility] ORS;  Service: Orthopedics;  Laterality: Left;  . NM MYOCAR PERF WALL MOTION  09/22/2008   lexiscan-EF 83%; glogal LV systolic fx is norm. ,evidence of mild ischemia basal anterior,midanterior and apical lateral region(s).   . ORIF PATELLA Right 08/29/2016   Procedure: OPEN REDUCTION INTERNAL  (ORIF) FIXATION RIGHT PATELLA;  Surgeon: Samson Frederic, MD;  Location: [facility] OR[facility];  Service: Orthopedics;  Laterality: Right;  Adductor Block  . TUBAL LIGATION    . TYMPANOPLASTY Bilateral   . UVULOPALATOPHARYNGOPLASTY     pt denies     Social History   Tobacco Use  . Smoking status: Never Smoker  . Smokeless tobacco: Never Used  Substance Use Topics  . Alcohol use: Yes    Comment: occasional, 1 a month wine  . Drug use: No    Family History  Problem Relation Age  of Onset  . Coronary artery disease Mother   . Rheum arthritis Mother   . Dementia Mother   . Heart attack Father   . Hypertension Father   . Hyperlipidemia Father   . Other Father        MVA  . Hypertension Brother   . Cancer Brother   . Heart disease Brother   . Cancer Paternal Grandmother        stomach  . Diabetes Paternal Grandfather   . Breast cancer Neg Hx     Allergies  Allergen Reactions  . Niacin And Related Other (See Comments)    Whelps and skin flushed, mouth tingling  . Tolectin [Tolmetin] Rash    Medication list has been reviewed and updated.  Current Outpatient Medications on File Prior to Visit  Medication Sig Dispense Refill  . acetaminophen (TYLENOL) 500 MG tablet Take 500 mg by mouth every 6 (six) hours as needed for mild pain or headache.    Marland Kitchen. aspirin EC 81 MG EC tablet Take 1 tablet (81 mg total) by mouth daily. 30 tablet 1  . calcium carbonate (OS-CAL) 1250 (500 Ca) MG chewable tablet Chew 1 tablet by mouth daily.    . cetirizine (ZYRTEC) 10 MG chewable tablet Chew 10 mg by mouth daily as needed for allergies.    . cholecalciferol (VITAMIN D) 1000 UNITS tablet Take 1,000 Units by mouth daily.    . clopidogrel (PLAVIX) 75 MG tablet TAKE 1 TABLET BY MOUTH DAILY. 90 tablet 3  . diazepam (VALIUM) 2 MG tablet TAKE 1 TABLET BY MOUTH EVERY 12 hours as needed for anxiety 40 tablet 1  . famotidine (PEPCID) 20 MG tablet TAKE 1 TABLET BY MOUTH 2 TIMES DAILY. 180 tablet 1  . fluticasone  (FLONASE) 50 MCG/ACT nasal spray Place 1 spray into both nostrils daily as needed for allergies or rhinitis.    . metFORMIN (GLUCOPHAGE-XR) 500 MG 24 hr tablet TAKE 1 TABLET (500 MG TOTAL) BY MOUTH DAILY WITH BREAKFAST. 90 tablet 1  . metoprolol succinate (TOPROL-XL) 25 MG 24 hr tablet TAKE 1/2 TABLET BY MOUTH DAILY 45 tablet 3  . Multiple Vitamin (MULTIVITAMIN WITH MINERALS) TABS tablet Take 1 tablet by mouth daily.    . nitroGLYCERIN (NITROSTAT) 0.4 MG SL tablet Place 1 tablet (0.4 mg total) under the tongue every 5 (five) minutes x 3 doses as needed for chest pain. 25 tablet 3  . ondansetron (ZOFRAN) 4 MG tablet Take 1 tablet (4 mg total) by mouth every 8 (eight) hours as needed. 12 tablet 0  . simvastatin (ZOCOR) 40 MG tablet Take 1 tablet (40 mg total) by mouth every morning. 90 tablet 1  . telmisartan (MICARDIS) 40 MG tablet Take 1 tablet (40 mg total) by mouth every morning. 90 tablet 3   No current facility-administered medications on file prior to visit.     Review of Systems:  As per HPI- otherwise negative.   Physical Examination: Vitals:   10/23/17 0924  BP: 138/72  Pulse: 67  Resp: 16  SpO2: 95%   Vitals:   10/23/17 0924  Weight: 213 lb 3.2 oz (96.7 kg)  Height: 5\' 1"  (1.549 m)   Body mass index is 40.28 kg/m. Ideal Body Weight: Weight in (lb) to have BMI = 25: 132  GEN: WDWN, NAD, Non-toxic, A & O x 3, obese, looks well     HEENT: Atraumatic, Normocephalic. Neck supple. No masses, No LAD.  Bilateral TM wnl, oropharynx normal.  PEERL,EOMI.  Ears and Nose: No external deformity. CV: RRR, No M/G/R. No JVD. No thrill. No extra heart sounds. PULM: CTA B, no wheezes, crackles, rhonchi. No retractions. No resp. distress. No accessory muscle use. ABD: S, NT, ND, +BS. No rebound. No HSM. EXTR: No c/c/e NEURO still favoring her right leg, but she is getting better and looking more steady on her feet PSYCH: Normally interactive. Conversant. Not depressed or anxious  appearing.  Calm demeanor.  Pt has pes planus left foot and has some chronic pain of her heel and ankle for which she uses voltaren gel   Assessment and Plan: Physical exam  Mild anemia - Plan: CBC  Essential hypertension - Plan: Basic metabolic panel, CBC  Spinal stenosis, lumbar region, with neurogenic claudication  CAD S/P percutaneous coronary angioplasty  Hyperlipidemia with target LDL less than 70 - Plan: Lipid panel  DM (diabetes mellitus) with complications (HCC) - Plan: Hemoglobin A1c  Obesity, morbid, BMI 40.0-49.9 (HCC)  Chronic pain of left ankle - Plan: diclofenac sodium (VOLTAREN) 1 % GEL  GAD (generalized anxiety disorder) - Plan: FLUoxetine (PROZAC) 20 MG capsule  OSA on CPAP - Plan: Ambulatory referral to Neurology  CPE today Labs pending as above Did refills for her today BP is under fine control She is making good strides in getting her strength back- encouraged her to keep working   Signed Abbe Amsterdam, MD  Received her labs 8/4- message to pt  Results for orders placed or performed in visit on 10/23/17  Basic metabolic panel  Result Value Ref Range   Sodium 139 135 - 145 mEq/L   Potassium 4.3 3.5 - 5.1 mEq/L   Chloride 103 96 - 112 mEq/L   CO2 31 19 - 32 mEq/L   Glucose, Bld 152 (H) 70 - 99 mg/dL   BUN 17 6 - 23 mg/dL   Creatinine, Ser 0.96 0.40 - 1.20 mg/dL   Calcium 9.9 8.4 - 04.5 mg/dL   GFR 40.98 >11.91 mL/min  CBC  Result Value Ref Range   WBC 9.4 4.0 - 10.5 K/uL   RBC 4.64 3.87 - 5.11 Mil/uL   Platelets 331.0 150.0 - 400.0 K/uL   Hemoglobin 11.9 (L) 12.0 - 15.0 g/dL   HCT 47.8 29.5 - 62.1 %   MCV 79.4 78.0 - 100.0 fl   MCHC 32.3 30.0 - 36.0 g/dL   RDW 30.8 65.7 - 84.6 %  Lipid panel  Result Value Ref Range   Cholesterol 163 0 - 200 mg/dL   Triglycerides (H) 0.0 - 149.0 mg/dL    962.9 Triglyceride is over 400; calculations on Lipids are invalid.   HDL 29.30 (L) >39.00 mg/dL   Total CHOL/HDL Ratio 6   Hemoglobin A1c   Result Value Ref Range   Hgb A1c MFr Bld 7.2 (H) 4.6 - 6.5 %  LDL cholesterol, direct  Result Value Ref Range   Direct LDL 52.0 mg/dL

## 2017-10-23 ENCOUNTER — Ambulatory Visit (INDEPENDENT_AMBULATORY_CARE_PROVIDER_SITE_OTHER): Payer: PPO | Admitting: Family Medicine

## 2017-10-23 ENCOUNTER — Encounter: Payer: Self-pay | Admitting: Family Medicine

## 2017-10-23 VITALS — BP 138/72 | HR 67 | Resp 16 | Ht 61.0 in | Wt 213.2 lb

## 2017-10-23 DIAGNOSIS — F411 Generalized anxiety disorder: Secondary | ICD-10-CM | POA: Diagnosis not present

## 2017-10-23 DIAGNOSIS — G4733 Obstructive sleep apnea (adult) (pediatric): Secondary | ICD-10-CM

## 2017-10-23 DIAGNOSIS — Z0000 Encounter for general adult medical examination without abnormal findings: Secondary | ICD-10-CM | POA: Diagnosis not present

## 2017-10-23 DIAGNOSIS — E118 Type 2 diabetes mellitus with unspecified complications: Secondary | ICD-10-CM | POA: Diagnosis not present

## 2017-10-23 DIAGNOSIS — I2510 Atherosclerotic heart disease of native coronary artery without angina pectoris: Secondary | ICD-10-CM | POA: Diagnosis not present

## 2017-10-23 DIAGNOSIS — E785 Hyperlipidemia, unspecified: Secondary | ICD-10-CM | POA: Diagnosis not present

## 2017-10-23 DIAGNOSIS — M25572 Pain in left ankle and joints of left foot: Secondary | ICD-10-CM

## 2017-10-23 DIAGNOSIS — Z9861 Coronary angioplasty status: Secondary | ICD-10-CM

## 2017-10-23 DIAGNOSIS — Z9989 Dependence on other enabling machines and devices: Secondary | ICD-10-CM

## 2017-10-23 DIAGNOSIS — M48062 Spinal stenosis, lumbar region with neurogenic claudication: Secondary | ICD-10-CM | POA: Diagnosis not present

## 2017-10-23 DIAGNOSIS — D649 Anemia, unspecified: Secondary | ICD-10-CM | POA: Diagnosis not present

## 2017-10-23 DIAGNOSIS — I10 Essential (primary) hypertension: Secondary | ICD-10-CM | POA: Diagnosis not present

## 2017-10-23 DIAGNOSIS — G8929 Other chronic pain: Secondary | ICD-10-CM

## 2017-10-23 LAB — BASIC METABOLIC PANEL
BUN: 17 mg/dL (ref 6–23)
CALCIUM: 9.9 mg/dL (ref 8.4–10.5)
CO2: 31 meq/L (ref 19–32)
CREATININE: 0.77 mg/dL (ref 0.40–1.20)
Chloride: 103 mEq/L (ref 96–112)
GFR: 79.23 mL/min (ref >60.00)
Glucose, Bld: 152 mg/dL — ABNORMAL HIGH (ref 70–99)
Potassium: 4.3 mEq/L (ref 3.5–5.1)
SODIUM: 139 meq/L (ref 135–145)

## 2017-10-23 LAB — CBC
HCT: 36.8 % (ref 36.0–46.0)
Hemoglobin: 11.9 g/dL — ABNORMAL LOW (ref 12.0–15.0)
MCHC: 32.3 g/dL (ref 30.0–36.0)
MCV: 79.4 fl (ref 78.0–100.0)
Platelets: 331 10*3/uL (ref 150.0–400.0)
RBC: 4.64 Mil/uL (ref 3.87–5.11)
RDW: 15 % (ref 11.5–15.5)
WBC: 9.4 10*3/uL (ref 4.0–10.5)

## 2017-10-23 LAB — LIPID PANEL
CHOLESTEROL: 163 mg/dL (ref 0–200)
HDL: 29.3 mg/dL — AB (ref >39.00)
Total CHOL/HDL Ratio: 6

## 2017-10-23 LAB — LDL CHOLESTEROL, DIRECT: Direct LDL: 52 mg/dL

## 2017-10-23 LAB — HEMOGLOBIN A1C: Hgb A1c MFr Bld: 7.2 % — ABNORMAL HIGH (ref 4.6–6.5)

## 2017-10-23 MED ORDER — DICLOFENAC SODIUM 1 % TD GEL: TRANSDERMAL | 3 refills | Status: DC

## 2017-10-23 MED ORDER — FLUOXETINE HCL 20 MG PO CAPS: 20.0000 mg | ORAL_CAPSULE | Freq: Every day | ORAL | 3 refills | Status: DC

## 2017-10-23 MED FILL — DICLOFENAC SODIUM 1% GEL: 1 | 17 days supply | Qty: 100 | Fill #0

## 2017-10-23 NOTE — Patient Instructions (Addendum)
I would recommend that you get Shingrix vaccine at your drug store at your convenience  Great to see you as always- I am so glad that you are doing better I will be in touch with your labs and will request a copy of your colonoscopy report from Dr. Collene Mares Will refer you back to see Dr. Keturah Barre about your sleep apnea Continue to work on your strength and balance- you are making such good progress!     Health Maintenance for Postmenopausal Women Menopause is a normal process in which your reproductive ability comes to an end. This process happens gradually over a span of months to years, usually between the ages of 28 and 79. Menopause is complete when you have missed 12 consecutive menstrual periods. It is important to talk with your health care provider about some of the most common conditions that affect postmenopausal women, such as heart disease, cancer, and bone loss (osteoporosis). Adopting a healthy lifestyle and getting preventive care can help to promote your health and wellness. Those actions can also lower your chances of developing some of these common conditions. What should I know about menopause? During menopause, you may experience a number of symptoms, such as:  Moderate-to-severe hot flashes.  Night sweats.  Decrease in sex drive.  Mood swings.  Headaches.  Tiredness.  Irritability.  Memory problems.  Insomnia.  Choosing to treat or not to treat menopausal changes is an individual decision that you make with your health care provider. What should I know about hormone replacement therapy and supplements? Hormone therapy products are effective for treating symptoms that are associated with menopause, such as hot flashes and night sweats. Hormone replacement carries certain risks, especially as you become older. If you are thinking about using estrogen or estrogen with progestin treatments, discuss the benefits and risks with your health care provider. What should I know about  heart disease and stroke? Heart disease, heart attack, and stroke become more likely as you age. This may be due, in part, to the hormonal changes that your body experiences during menopause. These can affect how your body processes dietary fats, triglycerides, and cholesterol. Heart attack and stroke are both medical emergencies. There are many things that you can do to help prevent heart disease and stroke:  Have your blood pressure checked at least every 1-2 years. High blood pressure causes heart disease and increases the risk of stroke.  If you are 49-71 years old, ask your health care provider if you should take aspirin to prevent a heart attack or a stroke.  Do not use any tobacco products, including cigarettes, chewing tobacco, or electronic cigarettes. If you need help quitting, ask your health care provider.  It is important to eat a healthy diet and maintain a healthy weight. ? Be sure to include plenty of vegetables, fruits, low-fat dairy products, and lean protein. ? Avoid eating foods that are high in solid fats, added sugars, or salt (sodium).  Get regular exercise. This is one of the most important things that you can do for your health. ? Try to exercise for at least 150 minutes each week. The type of exercise that you do should increase your heart rate and make you sweat. This is known as moderate-intensity exercise. ? Try to do strengthening exercises at least twice each week. Do these in addition to the moderate-intensity exercise.  Know your numbers.Ask your health care provider to check your cholesterol and your blood glucose. Continue to have your blood tested as  directed by your health care provider.  What should I know about cancer screening? There are several types of cancer. Take the following steps to reduce your risk and to catch any cancer development as early as possible. Breast Cancer  Practice breast self-awareness. ? This means understanding how your  breasts normally appear and feel. ? It also means doing regular breast self-exams. Let your health care provider know about any changes, no matter how small.  If you are 3 or older, have a clinician do a breast exam (clinical breast exam or CBE) every year. Depending on your age, family history, and medical history, it may be recommended that you also have a yearly breast X-ray (mammogram).  If you have a family history of breast cancer, talk with your health care provider about genetic screening.  If you are at high risk for breast cancer, talk with your health care provider about having an MRI and a mammogram every year.  Breast cancer (BRCA) gene test is recommended for women who have family members with BRCA-related cancers. Results of the assessment will determine the need for genetic counseling and BRCA1 and for BRCA2 testing. BRCA-related cancers include these types: ? Breast. This occurs in males or females. ? Ovarian. ? Tubal. This may also be called fallopian tube cancer. ? Cancer of the abdominal or pelvic lining (peritoneal cancer). ? Prostate. ? Pancreatic.  Cervical, Uterine, and Ovarian Cancer Your health care provider may recommend that you be screened regularly for cancer of the pelvic organs. These include your ovaries, uterus, and vagina. This screening involves a pelvic exam, which includes checking for microscopic changes to the surface of your cervix (Pap test).  For women ages 21-65, health care providers may recommend a pelvic exam and a Pap test every three years. For women ages 41-65, they may recommend the Pap test and pelvic exam, combined with testing for human papilloma virus (HPV), every five years. Some types of HPV increase your risk of cervical cancer. Testing for HPV may also be done on women of any age who have unclear Pap test results.  Other health care providers may not recommend any screening for nonpregnant women who are considered low risk for pelvic  cancer and have no symptoms. Ask your health care provider if a screening pelvic exam is right for you.  If you have had past treatment for cervical cancer or a condition that could lead to cancer, you need Pap tests and screening for cancer for at least 20 years after your treatment. If Pap tests have been discontinued for you, your risk factors (such as having a new sexual partner) need to be reassessed to determine if you should start having screenings again. Some women have medical problems that increase the chance of getting cervical cancer. In these cases, your health care provider may recommend that you have screening and Pap tests more often.  If you have a family history of uterine cancer or ovarian cancer, talk with your health care provider about genetic screening.  If you have vaginal bleeding after reaching menopause, tell your health care provider.  There are currently no reliable tests available to screen for ovarian cancer.  Lung Cancer Lung cancer screening is recommended for adults 37-7 years old who are at high risk for lung cancer because of a history of smoking. A yearly low-dose CT scan of the lungs is recommended if you:  Currently smoke.  Have a history of at least 30 pack-years of smoking and you currently  smoke or have quit within the past 15 years. A pack-year is smoking an average of one pack of cigarettes per day for one year.  Yearly screening should:  Continue until it has been 15 years since you quit.  Stop if you develop a health problem that would prevent you from having lung cancer treatment.  Colorectal Cancer  This type of cancer can be detected and can often be prevented.  Routine colorectal cancer screening usually begins at age 70 and continues through age 41.  If you have risk factors for colon cancer, your health care provider may recommend that you be screened at an earlier age.  If you have a family history of colorectal cancer, talk with  your health care provider about genetic screening.  Your health care provider may also recommend using home test kits to check for hidden blood in your stool.  A small camera at the end of a tube can be used to examine your colon directly (sigmoidoscopy or colonoscopy). This is done to check for the earliest forms of colorectal cancer.  Direct examination of the colon should be repeated every 5-10 years until age 89. However, if early forms of precancerous polyps or small growths are found or if you have a family history or genetic risk for colorectal cancer, you may need to be screened more often.  Skin Cancer  Check your skin from head to toe regularly.  Monitor any moles. Be sure to tell your health care provider: ? About any new moles or changes in moles, especially if there is a change in a mole's shape or color. ? If you have a mole that is larger than the size of a pencil eraser.  If any of your family members has a history of skin cancer, especially at a young age, talk with your health care provider about genetic screening.  Always use sunscreen. Apply sunscreen liberally and repeatedly throughout the day.  Whenever you are outside, protect yourself by wearing long sleeves, pants, a wide-brimmed hat, and sunglasses.  What should I know about osteoporosis? Osteoporosis is a condition in which bone destruction happens more quickly than new bone creation. After menopause, you may be at an increased risk for osteoporosis. To help prevent osteoporosis or the bone fractures that can happen because of osteoporosis, the following is recommended:  If you are 42-66 years old, get at least 1,000 mg of calcium and at least 600 mg of vitamin D per day.  If you are older than age 47 but younger than age 42, get at least 1,200 mg of calcium and at least 600 mg of vitamin D per day.  If you are older than age 40, get at least 1,200 mg of calcium and at least 800 mg of vitamin D per  day.  Smoking and excessive alcohol intake increase the risk of osteoporosis. Eat foods that are rich in calcium and vitamin D, and do weight-bearing exercises several times each week as directed by your health care provider. What should I know about how menopause affects my mental health? Depression may occur at any age, but it is more common as you become older. Common symptoms of depression include:  Low or sad mood.  Changes in sleep patterns.  Changes in appetite or eating patterns.  Feeling an overall lack of motivation or enjoyment of activities that you previously enjoyed.  Frequent crying spells.  Talk with your health care provider if you think that you are experiencing depression. What should  I know about immunizations? It is important that you get and maintain your immunizations. These include:  Tetanus, diphtheria, and pertussis (Tdap) booster vaccine.  Influenza every year before the flu season begins.  Pneumonia vaccine.  Shingles vaccine.  Your health care provider may also recommend other immunizations. This information is not intended to replace advice given to you by your health care provider. Make sure you discuss any questions you have with your health care provider. Document Released: 05/03/2005 Document Revised: 09/29/2015 Document Reviewed: 12/13/2014 Elsevier Interactive Patient Education  2018 [employer].

## 2017-10-24 MED FILL — TELMISARTAN 40 MG TABS: 40 | 90 days supply | Qty: 90 | Fill #1

## 2017-10-26 ENCOUNTER — Encounter: Payer: Self-pay | Admitting: Family Medicine

## 2017-10-27 MED FILL — FLUoxetine HCL 20 MG CAPS: 20 | 90 days supply | Qty: 90 | Fill #0

## 2017-11-03 ENCOUNTER — Encounter: Payer: Self-pay | Admitting: Neurology

## 2017-11-03 ENCOUNTER — Ambulatory Visit (INDEPENDENT_AMBULATORY_CARE_PROVIDER_SITE_OTHER): Payer: PPO | Admitting: Neurology

## 2017-11-03 VITALS — BP 127/65 | HR 68 | Ht 61.0 in | Wt 211.0 lb

## 2017-11-03 DIAGNOSIS — G4733 Obstructive sleep apnea (adult) (pediatric): Secondary | ICD-10-CM

## 2017-11-03 DIAGNOSIS — Z72821 Inadequate sleep hygiene: Secondary | ICD-10-CM | POA: Diagnosis not present

## 2017-11-03 DIAGNOSIS — G4700 Insomnia, unspecified: Secondary | ICD-10-CM | POA: Diagnosis not present

## 2017-11-03 DIAGNOSIS — I25110 Atherosclerotic heart disease of native coronary artery with unstable angina pectoris: Secondary | ICD-10-CM | POA: Diagnosis not present

## 2017-11-03 NOTE — Progress Notes (Signed)
SLEEP MEDICINE CLINIC   Provider:  Melvyn Novasarmen  Lamia Mariner, [place]  Primary Care Physician:  Pearline Cablesopland, Jessica C, MD   Referring Provider: Pearline Cablesopland, Jessica C, MD / primary neurologist is Dr. Marjory LiesPenumalli   Chief Complaint  Patient presents with  . New Patient (Initial Visit)    pt fells year ago hit head and knee, she has been having difficulty with memory difficulties. she has been off of CPAP treatment for at least 2 years.     HPI:  [name] is a 67 y.o. female , seen here  in a referral/ revisit  from Dr. Patsy Lageropland for re-establishing CPAP therapy.    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 Joycelyn SchmidVikram Penumalli for sleep study the date of the study was 13 February 2014 she had a endorsed coronary artery disease, obesity, asthma, anemia, diabetes mellitus with nocturia, neuropathy, hypertension, GERD, hyperlipidemia and she has been followed for memory loss has had extensive work[facility] .  Her husband had witnessed loud snoring and she also struggled with insomnia and frequent awakenings at night.  At the time she had a BMI of 42.1 Epworth Sleepiness Scale was endorsed at 10 point.  Patient was diagnosed with mild apnea at an AHI of 11.2, which exacerbated during REM sleep to 44.7, there was minor accentuation during supine sleep versus nonsupine sleep.  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   She was placed on CPAP and reports that she used the machine compliantly- advised that she had to use it 4 hours each night, but then received a phone call from the DME stating that she was no longer in compliance and she had to return her CPAP machine this was about 2-1/2 years ago. Her husband interjjected that had not used it  Due to nocturia- "the up and down and being  tethered".   Sleep habits are as follows: Dinner time is usually 6 PM, and she may use her stationary bike some afternoons. Her bed time is 10 PM or ater, not later than midnight. Her bedroom is cool, quiet and dark- uses a fan. She is usually asleep by 12.00 but she sleeps in the same bed with a her great- granddaughter ( 4) , and is frequently woken.  She rises at 3.30 and 6.30 for nocturia- every 3 hours . She will sleep again until 10 AM.  She naps by 2 pm- her sleep habits have deteriorated. She snores and she still has apnea.   Sleep medical history and family sleep history:  See above.   Social history: She drinks a lot of caffeinated tea, no sodas, no coffee, she also drinks juice. Non-Smoker, No ETOH.  Retired Engineer, civil (consulting)nurse.     Review of Systems: Out of a complete 14 system review, the patient complains of only the following symptoms, and all other reviewed systems are negative. Chronic insomnia, sleep hygiene, anxiety, obesity.  Patient has reportedly some panic attacks, she has some fear of going outside and walking, she is now treated with Prozac.  Her anxiety or panic attacks manifest as an in a trembling, palpitation, sometimes diaphoresis all times of stress and adrenaline release.  Severe HST - Epworth score  21 / 24 points   , Fatigue severity score 53/  63   , depression score 5/ 15    Social History   Socioeconomic History  . Marital status: Married    Spouse name: [name]  . Number of children: 2  . Years of education: MSN  . Highest education level: Not on file  Occupational History  . Occupation: Chemical engineerDIRECTOR    Employer: [employer]  Social Needs  . Financial resource strain: Not on file  . Food insecurity:    Worry: Not on file    Inability: Not on file  . Transportation needs:    Medical: Not on file    Non-medical: Not on file  Tobacco Use  . Smoking status: Never Smoker  . Smokeless tobacco: Never Used  Substance and Sexual Activity  . Alcohol use: Yes     Comment: occasional, 1 a month wine  . Drug use: No  . Sexual activity: Yes  Lifestyle  . Physical activity:    Days per week: Not on file    Minutes per session: Not on file  . Stress: Not on file  Relationships  . Social connections:    Talks on phone: Not on file    Gets together: Not on file    Attends religious service: Not on file    Active member of club or organization: Not on file    Attends meetings of clubs or organizations: Not on file    Relationship status: Not on file  . Intimate partner violence:    Fear of current or ex partner: Not on file    Emotionally abused: Not on file    Physically abused: Not on file    Forced sexual activity: Not on file  Other Topics Concern  . Not on file  Social History Narrative      Married to [name]. Education: [employer]. Exercise: Yes   Patient lives at home with her spouse. retired   Caffeine use: 1 drink of tea a day    Family History  Problem Relation Age of Onset  . Coronary artery disease Mother   . Rheum arthritis Mother   . Dementia Mother   . Heart attack Father   . Hypertension Father   . Hyperlipidemia Father   . Other Father        MVA  . Hypertension Brother   . Cancer Brother   . Heart disease Brother   . Cancer Paternal Grandmother        stomach  . Diabetes Paternal Grandfather   . Breast cancer Neg Hx     Past Medical History:  Diagnosis Date  . Anemia   . Anginal pain (HCC)    pt relates to stress pt was seen by Dr Rennis GoldenHilty 3 wks ago was given metoprolol   . Bronchitis    hx of   . CAD (coronary artery disease)    PCI to LAD in 2010, mild residual disease in LCX and RCA  . Cataracts, bilateral   . Diabetes mellitus without complication (HCC)   . Family history of adverse reaction to anesthesia    pts mother had difficulty awakening   . GERD (gastroesophageal reflux disease)   . Hyperlipidemia LDL goal < 70   . Hypertension   . Lumbar back pain   . Numbness    left leg and foot   . Plantar  fascia rupture    Left Foot  . Sleep apnea    uses CPAP     Past Surgical History:  Procedure Laterality Date  .  ABDOMINAL HYSTERECTOMY    . BREAST CYST ASPIRATION  1995  . CAROTID STENT  2009   pt denies   . CORONARY ANGIOPLASTY WITH STENT PLACEMENT  2010and 10-02-2012   Stent DES, Xience to prox. LAD  . DOPPLER ECHOCARDIOGRAPHY  08/01/2009   EF=>55%,LV normal  . LEFT HEART CATHETERIZATION WITH CORONARY ANGIOGRAM N/A 10/02/2012   Procedure: LEFT HEART CATHETERIZATION WITH CORONARY ANGIOGRAM;  Surgeon: Runell Gess, MD;  Location: [facility] CATH LAB;  Service: Cardiovascular;  Laterality: N/A;  . lower arterial duplex  06/20/10   abi's normal,rgt 0.98,lft 1.06;bilateral PVRs normal  . LUMBAR LAMINECTOMY/DECOMPRESSION MICRODISCECTOMY Left 01/06/2013   Procedure: MICRO LUMBAR DECOMPRESSION L4-5 AND L5-S1;  Surgeon: Javier Docker, MD;  Location: [facility] ORS;  Service: Orthopedics;  Laterality: Left;  . LUMBAR LAMINECTOMY/DECOMPRESSION MICRODISCECTOMY Left 10/12/2014   Procedure: REVISION MICRO LUMBAR/DECOMPRESSION L4-5 LEFT ;  Surgeon: Jene Every, MD;  Location: [facility] ORS;  Service: Orthopedics;  Laterality: Left;  . NM MYOCAR PERF WALL MOTION  09/22/2008   lexiscan-EF 83%; glogal LV systolic fx is norm. ,evidence of mild ischemia basal anterior,midanterior and apical lateral region(s).   . ORIF PATELLA Right 08/29/2016   Procedure: OPEN REDUCTION INTERNAL (ORIF) FIXATION RIGHT PATELLA;  Surgeon: Samson Frederic, MD;  Location: [facility] ORS;  Service: Orthopedics;  Laterality: Right;  Adductor Block  . TUBAL LIGATION    . TYMPANOPLASTY Bilateral   . UVULOPALATOPHARYNGOPLASTY     pt denies     Current Outpatient Medications  Medication Sig Dispense Refill  . acetaminophen (TYLENOL) 500 MG tablet Take 500 mg by mouth every 6 (six) hours as needed for mild pain or headache.    Marland Kitchen aspirin EC 81 MG EC tablet Take 1 tablet (81 mg total) by mouth daily. 30 tablet 1  . calcium carbonate (OS-CAL) 1250 (500  Ca) MG chewable tablet Chew 1 tablet by mouth daily.    . cetirizine (ZYRTEC) 10 MG chewable tablet Chew 10 mg by mouth daily as needed for allergies.    . cholecalciferol (VITAMIN D) 1000 UNITS tablet Take 1,000 Units by mouth daily.    . clopidogrel (PLAVIX) 75 MG tablet TAKE 1 TABLET BY MOUTH DAILY. 90 tablet 3  . diazepam (VALIUM) 2 MG tablet TAKE 1 TABLET BY MOUTH EVERY 12 hours as needed for anxiety 40 tablet 1  . diclofenac sodium (VOLTAREN) 1 % GEL Apply 2 grams on left ankle/ heel 2-3x daily as needed 100 g 3  . famotidine (PEPCID) 20 MG tablet TAKE 1 TABLET BY MOUTH 2 TIMES DAILY. 180 tablet 1  . FLUoxetine (PROZAC) 20 MG capsule Take 1 capsule (20 mg total) by mouth daily. 90 capsule 3  . fluticasone (FLONASE) 50 MCG/ACT nasal spray Place 1 spray into both nostrils daily as needed for allergies or rhinitis.    . metFORMIN (GLUCOPHAGE-XR) 500 MG 24 hr tablet TAKE 1 TABLET (500 MG TOTAL) BY MOUTH DAILY WITH BREAKFAST. 90 tablet 1  . metoprolol succinate (TOPROL-XL) 25 MG 24 hr tablet TAKE 1/2 TABLET BY MOUTH DAILY 45 tablet 3  . Multiple Vitamin (MULTIVITAMIN WITH MINERALS) TABS tablet Take 1 tablet by mouth daily.    . nitroGLYCERIN (NITROSTAT) 0.4 MG SL tablet Place 1 tablet (0.4 mg total) under the tongue every 5 (five) minutes x 3 doses as needed for chest pain. 25 tablet 3  . ondansetron (ZOFRAN) 4 MG tablet Take 1 tablet (4 mg total) by mouth every 8 (eight) hours as needed. 12 tablet 0  . simvastatin (ZOCOR)  40 MG tablet Take 1 tablet (40 mg total) by mouth every morning. 90 tablet 1  . telmisartan (MICARDIS) 40 MG tablet Take 1 tablet (40 mg total) by mouth every morning. 90 tablet 3   No current facility-administered medications for this visit.     Allergies as of 11/03/2017 - Review Complete 11/03/2017  Allergen Reaction Noted  . Niacin and related Other (See Comments) 12/29/2012  . Tolectin [tolmetin] Rash 12/20/2013    Vitals: BP 127/65   Pulse 68   Ht 5\' 1"  (1.549  m)   Wt 211 lb (95.7 kg)   LMP  (LMP Unknown)   BMI 39.87 kg/m  Last Weight:  Wt Readings from Last 1 Encounters:  11/03/17 211 lb (95.7 kg)   ZOX:WRUE mass index is 39.87 kg/m.     Last Height:   Ht Readings from Last 1 Encounters:  11/03/17 5\' 1"  (1.549 m)    Physical exam:  General: The patient is awake, alert and appears not in acute distress. The patient is well groomed. Head: Normocephalic, atraumatic. Neck is supple. Mallampati 4-5  neck circumference 15.75 . Nasal airflow constricted , Retrognathia is not seen.  Cardiovascular:  Regular rate and rhythm , without  murmurs or carotid bruit, and without distended neck veins. Respiratory: Lungs are clear to auscultation. Skin:  Without evidence of edema, or rash Trunk: BMI is 40. Pear shaped built/   Neurologic exam :   Mood and affect are anxious, pressured speech.   Cranial nerves:  Taste and smell intact. Pupils are equal and briskly reactive to light. Extraocular movements  in vertical and horizontal planes intact and without nystagmus. Visual fields by finger perimetry are intact.Hearing to finger rub intact.  Facial sensation intact to fine touch. Facial motor strength is symmetric and tongue and uvula move midline. Shoulder shrug was symmetrical.  Motor exam:   Normal tone, muscle bulk and symmetric strength in all extremities. Finger-to-nose maneuver  normal without evidence of ataxia, dysmetria or tremor. Gait and station: Patient walks without assistive device. Deep tendon reflexes: in the  upper and lower extremities are symmetric and intact.   Assessment:  After physical and neurologic examination, review of laboratory studies,  Personal review of imaging studies, reports of other /same  Imaging studies, results of polysomnography and / or neurophysiology testing and pre-existing records as far as provided in visit., my assessment is   1)  Obesity, high grade mallompati, and neck size. All her risk factors for  OSA are still unchanged, she will still have OSA and should be on CPAP, witnessed apnea and snoring.   2) she cannot tell me if she was non compliant or not, wants to return to CPAP.   3) Her sleep hygiene needs work- insomnia is maintained by her 4 year great-grandchild sleeping with her, her sweet iced tea caffeine intake ( all that sugar = nocturia !!)  4) needs to lose weight.    The patient was advised of the nature of the diagnosed disorder , the treatment options and the  risks for general health and wellness arising from not treating the condition.   I spent more than 50  minutes of face to face time with the patient.  Greater than 50% of time was spent in counseling and coordination of care. We have discussed the diagnosis and differential and I answered the patient's questions.    Plan:  Treatment plan and additional workup :   Insomnia boot camp instructions given - consolidate your  sleep.    Repeat sleep study.    Melvyn Novas, MD 11/03/2017, 4:01 PM  Certified in Neurology by ABPN Certified in Sleep Medicine by [facility] [address] [place], [place] [address]

## 2017-11-03 NOTE — Patient Instructions (Signed)
Provider:  Melvyn Novasarmen  Duard Spiewak, M D  Referring Provider: Pearline Cablesopland, Jessica C, MD Primary Care Physician:  Pearline Cablesopland, Jessica C, MD  Chief Complaint  Patient presents with  . New Patient (Initial Visit)    pt fells year ago hit head and knee, she has been having difficulty with memory difficulties. she has been off of CPAP treatment for at least 2 years.     HPI:  [name] is a 67 y.o. female  is seen here as a referral/ revisit  from Dr. Patsy Lageropland for a sleep apnea  Review of Systems: Out of a complete 14 system review, the patient complains of only the following symptoms, and all other reviewed systems are negative.   Social History   Socioeconomic History  . Marital status: Married    Spouse name: [name]  . Number of children: 2  . Years of education: MSN  . Highest education level: Not on file  Occupational History  . Occupation: Chemical engineerDIRECTOR    Employer: [employer]  Social Needs  . Financial resource strain: Not on file  . Food insecurity:    Worry: Not on file    Inability: Not on file  . Transportation needs:    Medical: Not on file    Non-medical: Not on file  Tobacco Use  . Smoking status: Never Smoker  . Smokeless tobacco: Never Used  Substance and Sexual Activity  . Alcohol use: Yes    Comment: occasional, 1 a month wine  . Drug use: No  . Sexual activity: Yes  Lifestyle  . Physical activity:    Days per week: Not on file    Minutes per session: Not on file  . Stress: Not on file  Relationships  . Social connections:    Talks on phone: Not on file    Gets together: Not on file    Attends religious service: Not on file    Active member of club or organization: Not on file    Attends meetings of clubs or organizations: Not on file    Relationship status: Not on file  . Intimate partner violence:    Fear of current or ex partner: Not on file    Emotionally abused: Not on file    Physically abused: Not on file    Forced sexual activity: Not on file   Other Topics Concern  . Not on file  Social History Narrative      Married to BrightwoodBobby. Education: [employer]. Exercise: Yes   Patient lives at home with her spouse. retired   Caffeine use: 1 drink of tea a day    Family History  Problem Relation Age of Onset  . Coronary artery disease Mother   . Rheum arthritis Mother   . Dementia Mother   . Heart attack Father   . Hypertension Father   . Hyperlipidemia Father   . Other Father        MVA  . Hypertension Brother   . Cancer Brother   . Heart disease Brother   . Cancer Paternal Grandmother        stomach  . Diabetes Paternal Grandfather   . Breast cancer Neg Hx     Past Medical History:  Diagnosis Date  . Anemia   . Anginal pain (HCC)    pt relates to stress pt was seen by Dr Rennis GoldenHilty 3 wks ago was given metoprolol   . Bronchitis    hx of   . CAD (coronary  artery disease)    PCI to LAD in 2010, mild residual disease in LCX and RCA  . Cataracts, bilateral   . Diabetes mellitus without complication (HCC)   . Family history of adverse reaction to anesthesia    pts mother had difficulty awakening   . GERD (gastroesophageal reflux disease)   . Hyperlipidemia LDL goal < 70   . Hypertension   . Lumbar back pain   . Numbness    left leg and foot   . Plantar fascia rupture    Left Foot  . Sleep apnea    uses CPAP     Past Surgical History:  Procedure Laterality Date  . ABDOMINAL HYSTERECTOMY    . BREAST CYST ASPIRATION  1995  . CAROTID STENT  2009   pt denies   . CORONARY ANGIOPLASTY WITH STENT PLACEMENT  2010and 10-02-2012   Stent DES, Xience to prox. LAD  . DOPPLER ECHOCARDIOGRAPHY  08/01/2009   EF=>55%,LV normal  . LEFT HEART CATHETERIZATION WITH CORONARY ANGIOGRAM N/A 10/02/2012   Procedure: LEFT HEART CATHETERIZATION WITH CORONARY ANGIOGRAM;  Surgeon: Runell Gess, MD;  Location: [facility] CATH LAB;  Service: Cardiovascular;  Laterality: N/A;  . lower arterial duplex  06/20/10   abi's normal,rgt 0.98,lft 1.06;bilateral  PVRs normal  . LUMBAR LAMINECTOMY/DECOMPRESSION MICRODISCECTOMY Left 01/06/2013   Procedure: MICRO LUMBAR DECOMPRESSION L4-5 AND L5-S1;  Surgeon: Javier Docker, MD;  Location: [facility] ORS;  Service: Orthopedics;  Laterality: Left;  . LUMBAR LAMINECTOMY/DECOMPRESSION MICRODISCECTOMY Left 10/12/2014   Procedure: REVISION MICRO LUMBAR/DECOMPRESSION L4-5 LEFT ;  Surgeon: Jene Every, MD;  Location: [facility];  Service: Orthopedics;  Laterality: Left;  . NM MYOCAR PERF WALL MOTION  09/22/2008   lexiscan-EF 83%; glogal LV systolic fx is norm. ,evidence of mild ischemia basal anterior,midanterior and apical lateral region(s).   . ORIF PATELLA Right 08/29/2016   Procedure: OPEN REDUCTION INTERNAL (ORIF) FIXATION RIGHT PATELLA;  Surgeon: Samson Frederic, MD;  Location: [facility];  Service: Orthopedics;  Laterality: Right;  Adductor Block  . TUBAL LIGATION    . TYMPANOPLASTY Bilateral   . UVULOPALATOPHARYNGOPLASTY     pt denies     Current Outpatient Medications  Medication Sig Dispense Refill  . acetaminophen (TYLENOL) 500 MG tablet Take 500 mg by mouth every 6 (six) hours as needed for mild pain or headache.    Marland Kitchen aspirin EC 81 MG EC tablet Take 1 tablet (81 mg total) by mouth daily. 30 tablet 1  . calcium carbonate (OS-CAL) 1250 (500 Ca) MG chewable tablet Chew 1 tablet by mouth daily.    . cetirizine (ZYRTEC) 10 MG chewable tablet Chew 10 mg by mouth daily as needed for allergies.    . cholecalciferol (VITAMIN D) 1000 UNITS tablet Take 1,000 Units by mouth daily.    . clopidogrel (PLAVIX) 75 MG tablet TAKE 1 TABLET BY MOUTH DAILY. 90 tablet 3  . diazepam (VALIUM) 2 MG tablet TAKE 1 TABLET BY MOUTH EVERY 12 hours as needed for anxiety 40 tablet 1  . diclofenac sodium (VOLTAREN) 1 % GEL Apply 2 grams on left ankle/ heel 2-3x daily as needed 100 g 3  . famotidine (PEPCID) 20 MG tablet TAKE 1 TABLET BY MOUTH 2 TIMES DAILY. 180 tablet 1  . FLUoxetine (PROZAC) 20 MG capsule Take 1 capsule (20 mg total) by mouth  daily. 90 capsule 3  . fluticasone (FLONASE) 50 MCG/ACT nasal spray Place 1 spray into both nostrils daily as needed for allergies or rhinitis.    Marland Kitchen  metFORMIN (GLUCOPHAGE-XR) 500 MG 24 hr tablet TAKE 1 TABLET (500 MG TOTAL) BY MOUTH DAILY WITH BREAKFAST. 90 tablet 1  . metoprolol succinate (TOPROL-XL) 25 MG 24 hr tablet TAKE 1/2 TABLET BY MOUTH DAILY 45 tablet 3  . Multiple Vitamin (MULTIVITAMIN WITH MINERALS) TABS tablet Take 1 tablet by mouth daily.    . nitroGLYCERIN (NITROSTAT) 0.4 MG SL tablet Place 1 tablet (0.4 mg total) under the tongue every 5 (five) minutes x 3 doses as needed for chest pain. 25 tablet 3  . ondansetron (ZOFRAN) 4 MG tablet Take 1 tablet (4 mg total) by mouth every 8 (eight) hours as needed. 12 tablet 0  . simvastatin (ZOCOR) 40 MG tablet Take 1 tablet (40 mg total) by mouth every morning. 90 tablet 1  . telmisartan (MICARDIS) 40 MG tablet Take 1 tablet (40 mg total) by mouth every morning. 90 tablet 3   No current facility-administered medications for this visit.     Allergies as of 11/03/2017 - Review Complete 11/03/2017  Allergen Reaction Noted  . Niacin and related Other (See Comments) 12/29/2012  . Tolectin [tolmetin] Rash 12/20/2013    Vitals: BP 127/65   Pulse 68   Ht 5\' 1"  (1.549 m)   Wt 211 lb (95.7 kg)   LMP  (LMP Unknown)   BMI 39.87 kg/m  Last Weight:  Wt Readings from Last 1 Encounters:  11/03/17 211 lb (95.7 kg)   Last Height:   Ht Readings from Last 1 Encounters:  11/03/17 5\' 1"  (1.549 m)    Physical exam:  General: The patient is awake, alert and appears not in acute distress. The patient is well groomed. Plan:  Treatment plan and additional workup :  Please remember to try to maintain good sleep hygiene, which means: Keep a regular sleep and wake schedule, try not to exercise or have a meal within 2 hours of your bedtime, try to keep your bedroom conducive for sleep, that is, cool and dark, without light distractors such as an  illuminated alarm clock, and refrain from watching TV right before sleep or in the middle of the night and do not keep the TV or radio on during the night. Also, try not to use or play on electronic devices at bedtime, such as your cell phone, tablet PC or laptop. If you like to read at bedtime on an electronic device, try to dim the background light as much as possible. Do not eat in the middle of the night.   We will request a sleep study.    We will look for leg twitching and snoring or sleep apnea.   For chronic insomnia, you are best followed by a psychiatrist and/or sleep psychologist.   We will call you with the sleep study results and make a follow up appointment if needed.      Porfirio Mylararmen Brentyn Seehafer MD 11/03/2017

## 2017-11-05 ENCOUNTER — Other Ambulatory Visit: Payer: Self-pay | Admitting: Internal Medicine

## 2017-11-05 MED FILL — METOPROLOL SUCCINATE ER 25: 25 | 90 days supply | Qty: 45 | Fill #0

## 2017-11-05 NOTE — Telephone Encounter (Signed)
Rx request sent to pharmacy.  

## 2017-11-25 DIAGNOSIS — H6983 Other specified disorders of Eustachian tube, bilateral: Secondary | ICD-10-CM | POA: Diagnosis not present

## 2017-11-25 DIAGNOSIS — H903 Sensorineural hearing loss, bilateral: Secondary | ICD-10-CM | POA: Diagnosis not present

## 2017-11-27 MED FILL — CLOPIDOGREL 75 MG TABLET: 75 | 90 days supply | Qty: 90 | Fill #3

## 2017-12-01 ENCOUNTER — Ambulatory Visit (INDEPENDENT_AMBULATORY_CARE_PROVIDER_SITE_OTHER): Payer: PPO | Admitting: Neurology

## 2017-12-01 DIAGNOSIS — I25110 Atherosclerotic heart disease of native coronary artery with unstable angina pectoris: Secondary | ICD-10-CM

## 2017-12-01 DIAGNOSIS — G4733 Obstructive sleep apnea (adult) (pediatric): Secondary | ICD-10-CM

## 2017-12-01 DIAGNOSIS — G4700 Insomnia, unspecified: Secondary | ICD-10-CM

## 2017-12-01 DIAGNOSIS — M48062 Spinal stenosis, lumbar region with neurogenic claudication: Secondary | ICD-10-CM

## 2017-12-01 DIAGNOSIS — Z72821 Inadequate sleep hygiene: Secondary | ICD-10-CM

## 2017-12-08 ENCOUNTER — Encounter: Payer: Self-pay | Admitting: Neurology

## 2017-12-08 MED FILL — METFORMIN HCL ER 500 MG TAB: 500 | 90 days supply | Qty: 90 | Fill #1

## 2017-12-08 NOTE — Procedures (Signed)
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 She was originally referred by Dr. Joycelyn SchmidVikram Penumalli for sleep study the date of the study was 13 February 2014. At the time she had a BMI of 42.1 Epworth Sleepiness Scale was endorsed at 10 point.  Patient was diagnosed with mild apnea at an AHI of 11.2, which exacerbated during REM sleep to 44.7, had 54 minutes of total desaturation time, the oxygen nadir was 82% and she did have mild to moderate PLM disorder as well.  She was placed on CPAP and reports that she used the machine compliantly- Her husband interjected that had not used it due to nocturia- "the up and down and being tethered". She had to return the machine.   She had endorsed a history of coronary artery disease, obesity, asthma, anemia, diabetes mellitus with nocturia, neuropathy, hypertension, GERD, hyperlipidemia and she has been followed for memory loss - has had extensive work-up with Dr. Alinda DoomsBailar- Vilma PraderHeath .  Her husband had witnessed loud snoring and she also struggled with insomnia and frequent awakenings at night. She is diagnosed with anxiety, panic attacks, and palpitations at night, nocturia and diaphoresis.  The patient endorsed the Epworth Sleepiness Scale at 21/24 points, The FSS at 53/63 points, and Geriatric depression at 5/15 points.  The patient's weight 212 pounds with a height of 61 (inches), resulting in a BMI of 39.8 kg/m2.The patient's neck circumference measured 15 inches.  CURRENT MEDICATIONS: Tylenol, Aspirin, Zyrtec, Plavix, Valium, Voltaren, Pepcid, Prozac, Flonase, Metformin, Toprol, Nitrostat, Zofran, Zocor, Micardis   PROCEDURE:  This  is a multichannel digital polysomnogram utilizing the Somnostar 11.2 system.  Electrodes and sensors were applied and monitored per AASM Specifications.   EEG, EOG, Chin and Limb EMG, were sampled at 200 Hz.  ECG, Snore and Nasal Pressure, Thermal Airflow, Respiratory Effort, CPAP Flow and Pressure, Oximetry was sampled at 50 Hz. Digital video and audio were recorded.      BASELINE STUDY: Lights Out was at 22:53 and Lights On at 05:01.  Total recording time (TRT) was 368 minutes, with a total sleep time (TST) of 235.5 minutes.   The patient's sleep latency was 56 minutes.  REM latency was 259.5 minutes.  The sleep efficiency was 64.1 %.     SLEEP ARCHITECTURE: WASO (Wake after sleep onset) was 88 minutes.  There were 36 minutes in Stage N1, 129.5 minutes Stage N2, 22 minutes Stage N3 and 48 minutes in Stage REM.  The percentage of Stage N1 was 15.3%, Stage N2 was 55.%, Stage N3 was 9.3% and Stage R (REM sleep) was 20.4%.  The arousals were noted as: 33 were spontaneous, 12 were associated with PLMs, and 9 were associated with respiratory events.    RESPIRATORY ANALYSIS:  There were a total of 41 respiratory events:  4 obstructive apneas, 37 hypopneas with 0 respiratory event related arousals (RERAs). The total APNEA/HYPOPNEA INDEX (AHI) was 10.4 /hour and the total RESPIRATORY DISTURBANCE INDEX was 10.4 /hour.  41 events occurred in REM sleep and 0 events in NREM.  The REM AHI was 51.3 /hour, versus a non-REM AHI of 0. The patient spent 100 minutes of total sleep time in the supine position and 136 minutes in non-supine. The supine AHI was 24.6 versus a non-supine AHI of 0.0. OXYGEN SATURATION & C02:  The Wake baseline 02 saturation was 95%, with the lowest being 80%. Time spent below 89% saturation equaled 34 minutes.  AROUSALS/ PERIODIC LIMB MOVEMENTS:  The patient had a total of 177 Periodic Limb Movements.  The Periodic Limb Movement (PLM) index was 45.1 and the PLM Arousal index was 3.1/hour. The  arousals were noted as: 33 were spontaneous, 12 were associated with PLMs, and 9 were associated with respiratory events. Audio and video analysis did not show any abnormal or unusual movements, behaviors, phonations or vocalizations.  The patient took one bathroom break. Snoring was noted. EKG was in normal sinus rhythm (NSR).   IMPRESSION: Poor sleep efficiency. This time not due to nocturia. The SPLIT protocol was not implemented due to lack of sleep and sleep related apnea in the first 2 hours of recording.  Post-study, the patient indicated that sleep was better than usual.    1. Obstructive Sleep Apnea (OSA) with strong REM sleep accentuation and supine sleep position accentuation.  2. Hypoxemia associated with Apnea.  3. Severe Periodic Limb Movement Disorder (PLMD) at arousals at 3.1/h/. 4. Primary Snoring  RECOMMENDATIONS:  1. Advise full-night, attended, CPAP titration study to optimize therapy.  2. PLM work up needed, clinically correlation to restless legs- possible underlying conditions such as spinal stenosis, neuropathy, anemia?    I certify that I have reviewed the entire raw data recording prior to the issuance of this report in accordance with the Standards of Accreditation of the American Academy of Sleep Medicine (AASM)    Melvyn Novas, MD     12-08-2017  Diplomat, American Board of Psychiatry and Neurology  Diplomat, American Board of Sleep Medicine Medical Director, [place] [facility]

## 2017-12-08 NOTE — Addendum Note (Signed)
Addended by: Melvyn NovasHMEIER, Sammye Staff on: 12/08/2017 09:34 AM   Modules accepted: Orders

## 2017-12-22 ENCOUNTER — Ambulatory Visit (INDEPENDENT_AMBULATORY_CARE_PROVIDER_SITE_OTHER): Payer: PPO | Admitting: Neurology

## 2017-12-22 DIAGNOSIS — Z72821 Inadequate sleep hygiene: Secondary | ICD-10-CM

## 2017-12-22 DIAGNOSIS — G478 Other sleep disorders: Secondary | ICD-10-CM

## 2017-12-22 DIAGNOSIS — G4733 Obstructive sleep apnea (adult) (pediatric): Secondary | ICD-10-CM

## 2017-12-22 DIAGNOSIS — G4700 Insomnia, unspecified: Secondary | ICD-10-CM

## 2017-12-22 DIAGNOSIS — M48062 Spinal stenosis, lumbar region with neurogenic claudication: Secondary | ICD-10-CM

## 2017-12-22 DIAGNOSIS — I25110 Atherosclerotic heart disease of native coronary artery with unstable angina pectoris: Secondary | ICD-10-CM

## 2017-12-26 NOTE — Procedures (Signed)
PATIENT'S NAME:  [name], [name] DOB:      [birth year]      MR#:    [id number]     DATE OF RECORDING: 12/22/2017 REFERRING M.D.:  Abbe Amsterdam, M.D. Study Performed:   Titration to Positive Airway Pressure HISTORY:  Mrs. Hafen is returning for CPAP titration following her PSG performed on 12/01/2017 which resulted in an AHI of 10.4/h. with strong REM accentuation to an AHI of 51.3/h and SpO2 nadir of 80% with a Total Desaturation Time of 34 minutes. UARS and PLM disorder were also diagnosed.  The patient endorsed the Epworth Sleepiness Scale at 21/24 points and the Fatigue Score at 53/63 points.   The patient's weight 212 pounds with a height of 61 (inches), resulting in a BMI of 40.0 kg/m2. The patient's neck circumference measured 15 inches.  CURRENT MEDICATIONS: Tylenol, Aspirin, Zyrtec, Plavix, Valium, Voltaren, Pepcid, Prozac, Flonase, Metformin, Toprol, Nitrostat, Zofran, Zocor, Micardis   PROCEDURE:  This is a multichannel digital polysomnogram utilizing the SomnoStar 11.2 system.  Electrodes and sensors were applied and monitored per AASM Specifications.   EEG, EOG, Chin and Limb EMG, were sampled at 200 Hz.  ECG, Snore and Nasal Pressure, Thermal Airflow, Respiratory Effort, CPAP Flow and Pressure, Oximetry was sampled at 50 Hz. Digital video and audio were recorded.      CPAP was initiated at 5 cmH20 with heated humidity per AASM split night standards and pressure was advanced to 8 cmH20 because of hypopneas, apneas and desaturations. At a PAP pressure of 8 cmH20, there was a reduction of the AHI to 0.0/h with improvement of sleep apnea. A nasal cradle, the" Dreamwear" interface in small size was used.   Lights Out was at 22:55 and Lights On at 05:03. Total recording time (TRT) was 368.5 minutes, with a total sleep time (TST) of 266 minutes. The patient's sleep latency was 34.5 minutes. REM latency was 194 minutes.  The sleep efficiency was 72.2 %.    SLEEP ARCHITECTURE: WASO (Wake after sleep  onset) was 82 minutes.  There were 23 minutes in Stage N1, 106.5 minutes Stage N2, 58 minutes Stage N3 and 78.5 minutes in Stage REM.  The percentage of Stage N1 was 8.6%, Stage N2 was 40.%, Stage N3 was 21.8% and Stage R (REM sleep) was 29.5%. The sleep architecture was notable for a very long REM sleep period.  RESPIRATORY ANALYSIS:  There was a total of 0 respiratory events: 0 apneas and 0 hypopneas with 0 respiratory event related arousals (RERAs).  The total APNEA/HYPOPNEA INDEX  (AHI) was 0 /hour and the total RESPIRATORY DISTURBANCE INDEX was 0 /hour  0 events occurred in REM sleep and 0 events in NREM. The REM AHI was 0 /hour versus a non-REM AHI of 0 /hour. The patient spent 156 minutes of total sleep time in the supine position and 110 minutes in non-supine. The supine AHI was 0.0, versus a non-supine AHI of 0.0. OXYGEN SATURATION & C02:  The baseline 02 saturation was 95%, with the lowest being 85%. Time spent below 89% saturation equaled 1 minutes.  PERIODIC LIMB MOVEMENTS:  The patient had a total of 33 Periodic Limb Movements. The Periodic Limb Movement (PLM) index was 7.4 and the PLM Arousal index was 0.5 /hour.   Audio and video analysis did not show any abnormal or unusual movements, behaviors, phonations or vocalizations.  The patient took bathroom breaks. Mild Snoring was noted at the lowest CPAP pressure. Marland Kitchen EKG was in keeping with normal sinus rhythm (  NSR). Post-study, the patient indicated that sleep was the same as usual.   DIAGNOSIS: Obstructive Sleep Apnea with UARS and PLMs alleviated under low pressure CPAP at 8 cm water. The patient was fitted with a Respironics Dreamwear small cradle mask.  PLANS/RECOMMENDATIONS:  1. The patient should avoid sedatives, hypnotics, and alcoholic beverage consumption at bedtime. 2. CPAP therapy compliance is defined as 4 hours or more of nightly use. 3. Autotitration CPAP device 6-10 cm water, with 1 cm EPR and heated humidity, with a  Respironics Dreamwear small cradle mask.   DISCUSSION:  RV will be scheduled within 60-90 days of CPAP therapy.  A follow up appointment will be scheduled in the Sleep Clinic at [facility].   Please call [phone] with any questions.     I certify that I have reviewed the entire raw data recording prior to the issuance of this report in accordance with the Standards of Accreditation of the American Academy of Sleep Medicine (AASM)   Melvyn Novas, M.D.    12-26-2017  Diplomat, American Board of Psychiatry and Neurology  Diplomat, American Board of Sleep Medicine Medical Director, [place] [facility]

## 2017-12-26 NOTE — Addendum Note (Signed)
Addended by: Melvyn Novas on: 12/26/2017 02:17 PM   Modules accepted: Orders

## 2017-12-27 ENCOUNTER — Encounter: Payer: Self-pay | Admitting: Neurology

## 2017-12-31 ENCOUNTER — Other Ambulatory Visit: Payer: Self-pay | Admitting: Internal Medicine

## 2017-12-31 MED FILL — SIMVASTATIN 40 MG TABS: 40 | 90 days supply | Qty: 90 | Fill #1

## 2018-01-01 MED FILL — FAMOTIDINE 20 MG TABLET: 20 | 90 days supply | Qty: 180 | Fill #0

## 2018-01-27 ENCOUNTER — Other Ambulatory Visit: Payer: Self-pay | Admitting: Cardiology

## 2018-01-27 DIAGNOSIS — G4733 Obstructive sleep apnea (adult) (pediatric): Secondary | ICD-10-CM | POA: Diagnosis not present

## 2018-01-27 MED FILL — DICLOFENAC SODIUM 1% GEL: 1 | 17 days supply | Qty: 100 | Fill #1

## 2018-01-27 MED FILL — FLUoxetine HCL 20 MG CAPS: 20 | 90 days supply | Qty: 90 | Fill #1

## 2018-01-27 MED FILL — TELMISARTAN 40 MG TABLET: 40 | 90 days supply | Qty: 90 | Fill #0

## 2018-01-27 NOTE — Telephone Encounter (Signed)
Rx request sent to pharmacy.  

## 2018-01-31 ENCOUNTER — Observation Stay (HOSPITAL_COMMUNITY)
Admission: EM | Admit: 2018-01-31 | Discharge: 2018-02-01 | Disposition: A | Discharge status: Home / Self Care | Payer: PPO | Attending: Internal Medicine | Admitting: Internal Medicine

## 2018-01-31 ENCOUNTER — Encounter (HOSPITAL_COMMUNITY): Payer: Self-pay | Admitting: Emergency Medicine

## 2018-01-31 ENCOUNTER — Emergency Department (HOSPITAL_COMMUNITY): Payer: PPO

## 2018-01-31 ENCOUNTER — Other Ambulatory Visit: Payer: Self-pay

## 2018-01-31 DIAGNOSIS — Z6838 Body mass index (BMI) 38.0-38.9, adult: Secondary | ICD-10-CM | POA: Diagnosis not present

## 2018-01-31 DIAGNOSIS — R079 Chest pain, unspecified: Secondary | ICD-10-CM | POA: Diagnosis not present

## 2018-01-31 DIAGNOSIS — I4510 Unspecified right bundle-branch block: Secondary | ICD-10-CM | POA: Diagnosis not present

## 2018-01-31 DIAGNOSIS — Z9861 Coronary angioplasty status: Secondary | ICD-10-CM

## 2018-01-31 DIAGNOSIS — E66812 Obesity, class 2: Secondary | ICD-10-CM | POA: Diagnosis present

## 2018-01-31 DIAGNOSIS — I10 Essential (primary) hypertension: Secondary | ICD-10-CM | POA: Insufficient documentation

## 2018-01-31 DIAGNOSIS — E118 Type 2 diabetes mellitus with unspecified complications: Secondary | ICD-10-CM | POA: Diagnosis not present

## 2018-01-31 DIAGNOSIS — K219 Gastro-esophageal reflux disease without esophagitis: Secondary | ICD-10-CM | POA: Insufficient documentation

## 2018-01-31 DIAGNOSIS — G4733 Obstructive sleep apnea (adult) (pediatric): Secondary | ICD-10-CM

## 2018-01-31 DIAGNOSIS — E1169 Type 2 diabetes mellitus with other specified complication: Secondary | ICD-10-CM | POA: Diagnosis present

## 2018-01-31 DIAGNOSIS — Z79899 Other long term (current) drug therapy: Secondary | ICD-10-CM | POA: Insufficient documentation

## 2018-01-31 DIAGNOSIS — Z7982 Long term (current) use of aspirin: Secondary | ICD-10-CM | POA: Diagnosis not present

## 2018-01-31 DIAGNOSIS — Z9989 Dependence on other enabling machines and devices: Secondary | ICD-10-CM | POA: Diagnosis not present

## 2018-01-31 DIAGNOSIS — M25572 Pain in left ankle and joints of left foot: Secondary | ICD-10-CM

## 2018-01-31 DIAGNOSIS — Z7902 Long term (current) use of antithrombotics/antiplatelets: Secondary | ICD-10-CM | POA: Diagnosis not present

## 2018-01-31 DIAGNOSIS — Z955 Presence of coronary angioplasty implant and graft: Secondary | ICD-10-CM | POA: Insufficient documentation

## 2018-01-31 DIAGNOSIS — R0602 Shortness of breath: Secondary | ICD-10-CM | POA: Diagnosis not present

## 2018-01-31 DIAGNOSIS — F419 Anxiety disorder, unspecified: Secondary | ICD-10-CM | POA: Insufficient documentation

## 2018-01-31 DIAGNOSIS — E119 Type 2 diabetes mellitus without complications: Secondary | ICD-10-CM | POA: Diagnosis present

## 2018-01-31 DIAGNOSIS — E785 Hyperlipidemia, unspecified: Secondary | ICD-10-CM | POA: Insufficient documentation

## 2018-01-31 DIAGNOSIS — E1159 Type 2 diabetes mellitus with other circulatory complications: Secondary | ICD-10-CM | POA: Diagnosis present

## 2018-01-31 DIAGNOSIS — Z7984 Long term (current) use of oral hypoglycemic drugs: Secondary | ICD-10-CM | POA: Insufficient documentation

## 2018-01-31 DIAGNOSIS — M549 Dorsalgia, unspecified: Secondary | ICD-10-CM | POA: Diagnosis present

## 2018-01-31 DIAGNOSIS — I2510 Atherosclerotic heart disease of native coronary artery without angina pectoris: Secondary | ICD-10-CM

## 2018-01-31 DIAGNOSIS — G8929 Other chronic pain: Secondary | ICD-10-CM

## 2018-01-31 HISTORY — DX: Unspecified osteoarthritis, unspecified site: M19.90

## 2018-01-31 LAB — CBC WITH DIFFERENTIAL/PLATELET
Abs Immature Granulocytes: 0.02 10*3/uL (ref 0.00–0.07)
Basophils Absolute: 0.1 10*3/uL (ref 0.0–0.1)
Basophils Relative: 1 %
Eosinophils Absolute: 0.7 10*3/uL — ABNORMAL HIGH (ref 0.0–0.5)
Eosinophils Relative: 6 %
HCT: 41.7 % (ref 36.0–46.0)
Hemoglobin: 12.4 g/dL (ref 12.0–15.0)
Immature Granulocytes: 0 %
Lymphocytes Relative: 43 %
Lymphs Abs: 4.7 10*3/uL — ABNORMAL HIGH (ref 0.7–4.0)
MCH: 24.6 pg — ABNORMAL LOW (ref 26.0–34.0)
MCHC: 29.7 g/dL — ABNORMAL LOW (ref 30.0–36.0)
MCV: 82.7 fL (ref 80.0–100.0)
Monocytes Absolute: 0.9 10*3/uL (ref 0.1–1.0)
Monocytes Relative: 8 %
Neutro Abs: 4.7 10*3/uL (ref 1.7–7.7)
Neutrophils Relative %: 42 %
Platelets: 336 10*3/uL (ref 150–400)
RBC: 5.04 MIL/uL (ref 3.87–5.11)
RDW: 14.5 % (ref 11.5–15.5)
WBC: 11.1 10*3/uL — ABNORMAL HIGH (ref 4.0–10.5)
nRBC: 0 % (ref 0.0–0.2)

## 2018-01-31 LAB — COMPREHENSIVE METABOLIC PANEL WITH GFR
ALT: 31 U/L (ref 0–44)
AST: 26 U/L (ref 15–41)
Albumin: 4.4 g/dL (ref 3.5–5.0)
Alkaline Phosphatase: 96 U/L (ref 38–126)
Anion gap: 9 (ref 5–15)
BUN: 12 mg/dL (ref 8–23)
CO2: 25 mmol/L (ref 22–32)
Calcium: 10.4 mg/dL — ABNORMAL HIGH (ref 8.9–10.3)
Chloride: 102 mmol/L (ref 98–111)
Creatinine, Ser: 0.74 mg/dL (ref 0.44–1.00)
GFR calc Af Amer: >60 mL/min
GFR calc non Af Amer: >60 mL/min
Glucose, Bld: 132 mg/dL — ABNORMAL HIGH (ref 70–99)
Potassium: 3.7 mmol/L (ref 3.5–5.1)
Sodium: 136 mmol/L (ref 135–145)
Total Bilirubin: 0.7 mg/dL (ref 0.3–1.2)
Total Protein: 7 g/dL (ref 6.5–8.1)

## 2018-01-31 LAB — TROPONIN I: Troponin I: <0.03 ng/mL (ref <0.03)

## 2018-01-31 LAB — CBC
HCT: 38.7 % (ref 36.0–46.0)
HEMOGLOBIN: 11.5 g/dL — AB (ref 12.0–15.0)
MCH: 24.2 pg — ABNORMAL LOW (ref 26.0–34.0)
MCHC: 29.7 g/dL — ABNORMAL LOW (ref 30.0–36.0)
MCV: 81.3 fL (ref 80.0–100.0)
Platelets: 341 10*3/uL (ref 150–400)
RBC: 4.76 MIL/uL (ref 3.87–5.11)
RDW: 14.3 % (ref 11.5–15.5)
WBC: 12.6 10*3/uL — AB (ref 4.0–10.5)
nRBC: 0 % (ref 0.0–0.2)

## 2018-01-31 LAB — I-STAT TROPONIN, ED: Troponin i, poc: 0 ng/mL (ref 0.00–0.08)

## 2018-01-31 LAB — CREATININE, SERUM: CREATININE: 0.74 mg/dL (ref 0.44–1.00)

## 2018-01-31 LAB — GLUCOSE, CAPILLARY: GLUCOSE-CAPILLARY: 152 mg/dL — AB (ref 70–99)

## 2018-01-31 MED ORDER — VITAMIN D 25 MCG (1000 UNIT) PO TABS
1000.0000 [IU] | ORAL_TABLET | Freq: Every day | ORAL | Status: DC
  Administered 2018-02-01: 1000 [IU] via ORAL

## 2018-01-31 MED ORDER — ALUM & MAG HYDROXIDE-SIMETH 200-200-20 MG/5ML PO SUSP
30.0000 mL | Freq: Once | ORAL | Status: AC
  Administered 2018-01-31: 30 mL via ORAL
  Filled 2018-01-31: qty 30

## 2018-01-31 MED ORDER — METOPROLOL SUCCINATE ER 25 MG PO TB24
12.5000 mg | ORAL_TABLET | Freq: Every day | ORAL | Status: DC
  Administered 2018-02-01: 12.5 mg via ORAL
  Filled 2018-01-31: qty 1

## 2018-01-31 MED ORDER — CLOPIDOGREL BISULFATE 75 MG PO TABS
75.0000 mg | ORAL_TABLET | Freq: Every day | ORAL | Status: DC
  Administered 2018-02-01: 75 mg via ORAL
  Filled 2018-01-31: qty 1

## 2018-01-31 MED ORDER — LIDOCAINE VISCOUS HCL 2 % MT SOLN
15.0000 mL | Freq: Once | OROMUCOSAL | Status: AC
  Administered 2018-01-31: 15 mL via ORAL
  Filled 2018-01-31: qty 15

## 2018-01-31 MED ORDER — ADULT MULTIVITAMIN W/MINERALS CH
1.0000 | ORAL_TABLET | Freq: Every day | ORAL | Status: DC
  Administered 2018-02-01: 1 via ORAL
  Filled 2018-01-31: qty 1

## 2018-01-31 MED ORDER — IOPAMIDOL (ISOVUE-370) INJECTION 76%
INTRAVENOUS | Status: AC
  Filled 2018-01-31: qty 100

## 2018-01-31 MED ORDER — ENOXAPARIN SODIUM 40 MG/0.4ML ~~LOC~~ SOLN
40.0000 mg | SUBCUTANEOUS | Status: DC
  Administered 2018-01-31: 40 mg via SUBCUTANEOUS
  Filled 2018-01-31: qty 0.4

## 2018-01-31 MED ORDER — DIAZEPAM 2 MG PO TABS
2.0000 mg | ORAL_TABLET | Freq: Two times a day (BID) | ORAL | Status: DC | PRN
  Administered 2018-02-01: 2 mg via ORAL
  Filled 2018-01-31 (×2): qty 1

## 2018-01-31 MED ORDER — CALCIUM CARBONATE ANTACID 500 MG PO CHEW
2.0000 | CHEWABLE_TABLET | Freq: Every day | ORAL | Status: DC
  Administered 2018-02-01: 400 mg via ORAL
  Filled 2018-01-31: qty 2

## 2018-01-31 MED ORDER — FAMOTIDINE 20 MG PO TABS
20.0000 mg | ORAL_TABLET | Freq: Two times a day (BID) | ORAL | Status: DC
  Administered 2018-02-01: 20 mg via ORAL
  Filled 2018-01-31: qty 1

## 2018-01-31 MED ORDER — FLUOXETINE HCL 20 MG PO CAPS
20.0000 mg | ORAL_CAPSULE | Freq: Every day | ORAL | Status: DC
  Administered 2018-02-01: 20 mg via ORAL
  Filled 2018-01-31: qty 1

## 2018-01-31 MED ORDER — ONDANSETRON HCL 4 MG/2ML IJ SOLN: 4.0000 mg | Freq: Four times a day (QID) | INTRAMUSCULAR | Status: DC | PRN

## 2018-01-31 MED ORDER — IRBESARTAN 150 MG PO TABS
150.0000 mg | ORAL_TABLET | Freq: Every day | ORAL | Status: DC
  Administered 2018-02-01: 150 mg via ORAL
  Filled 2018-01-31: qty 1

## 2018-01-31 MED ORDER — ASPIRIN EC 81 MG PO TBEC
81.0000 mg | DELAYED_RELEASE_TABLET | Freq: Every day | ORAL | Status: DC
  Administered 2018-02-01: 81 mg via ORAL
  Filled 2018-01-31: qty 1

## 2018-01-31 MED ORDER — ACETAMINOPHEN 500 MG PO TABS
500.0000 mg | ORAL_TABLET | Freq: Four times a day (QID) | ORAL | Status: DC | PRN
  Filled 2018-01-31: qty 1

## 2018-01-31 MED ORDER — SIMVASTATIN 40 MG PO TABS
40.0000 mg | ORAL_TABLET | Freq: Every day | ORAL | Status: DC
  Administered 2018-02-01: 40 mg via ORAL
  Filled 2018-01-31: qty 1

## 2018-01-31 MED ORDER — IOPAMIDOL (ISOVUE-370) INJECTION 76%
100.0000 mL | Freq: Once | INTRAVENOUS | Status: AC | PRN
  Administered 2018-01-31: 100 mL via INTRAVENOUS

## 2018-01-31 MED ORDER — ASPIRIN 81 MG PO TBEC: 81.0000 mg | DELAYED_RELEASE_TABLET | Freq: Every day | ORAL | Status: DC

## 2018-01-31 MED ORDER — FLUTICASONE PROPIONATE 50 MCG/ACT NA SUSP
1.0000 | Freq: Every day | NASAL | Status: DC | PRN
  Filled 2018-01-31: qty 16

## 2018-01-31 MED ORDER — MORPHINE SULFATE (PF) 2 MG/ML IV SOLN: 1.0000 mg | INTRAVENOUS | Status: DC | PRN

## 2018-01-31 MED ORDER — NITROGLYCERIN 0.4 MG SL SUBL: 0.4000 mg | SUBLINGUAL_TABLET | SUBLINGUAL | Status: DC | PRN

## 2018-01-31 MED ORDER — INSULIN ASPART 100 UNIT/ML ~~LOC~~ SOLN
0.0000 [IU] | Freq: Three times a day (TID) | SUBCUTANEOUS | Status: DC
  Filled 2018-01-31: qty 1

## 2018-01-31 MED ORDER — CALCIUM CARBONATE 1250 (500 CA) MG PO CHEW: 1250.0000 mg | CHEWABLE_TABLET | Freq: Every day | ORAL | Status: DC

## 2018-01-31 MED ORDER — MORPHINE SULFATE (PF) 4 MG/ML IV SOLN
4.0000 mg | Freq: Once | INTRAVENOUS | Status: AC
  Administered 2018-01-31: 2 mg via INTRAVENOUS
  Filled 2018-01-31: qty 1

## 2018-01-31 NOTE — ED Notes (Signed)
Pt declines entire dose of Morphin, pt requested a half dose, med administered, will continue to monitor

## 2018-01-31 NOTE — ED Triage Notes (Signed)
Pt. Stated, [name] had back pain that goes to the front and making me really SOB

## 2018-01-31 NOTE — H&P (Signed)
HISTORY AND PHYSICAL       PATIENT DETAILS Name: [name] Age: 67 y.o. Sex: female Date of Birth: 1951/01/02 Admit Date: 01/31/2018 [name], [name], MD   Patient coming from: Home   CHIEF COMPLAINT:  Chest pain  HPI: [name] is a 67 y.o. female with medical history significant of CAD status post PCI to proximal LAD in 2010, morbid obesity, hypertension, dyslipidemia, DM-2-presenting with the above-noted complaints.  Per patient since yesterday-she started having right-sided back pain that radiated to the front of her chest.  Pain was intermittent-and occurred multiple times throughout the whole day yesterday.  Pain was described as sharp, with no associated nausea, vomiting or palpitations.  This morning, she continued to have pain but noted that she was having some pleuritic chest pain as well.  She subsequently presented to the ED where a CT angiogram of the chest did not show a PE nor did it show aortic dissection.  Given her prior history of CAD and other multiple medical comorbidities-hospitalist service were asked to admit this patient for overnight observation.  During my evaluation-patient appeared comfortable-she did not have any chest pain.  ED Course:  CT Angie chest-negative for PE or aortic dissection.  Note: Lives at: Home Mobility:  Independent Chronic Indwelling Foley:no   REVIEW OF SYSTEMS:  Constitutional:   No  weight loss, night sweats,  Fevers, chills, fatigue.  HEENT:    No headaches, Dysphagia,Tooth/dental problems,Sore throat  Cardio-vascular: No Orthopnea, PND,lower extremity edema, anasarca, palpitations  GI:  No heartburn, indigestion, abdominal pain, nausea, vomiting, diarrhea, melena or hematochezia  Resp: No shortness of breath, cough, hemoptysis,plueritic chest pain.   Skin:  No rash or lesions.  GU:  No dysuria, change in color of urine, no urgency or frequency.  No flank pain.  Musculoskeletal: No  joint pain or swelling.  No decreased range of motion.  No back pain.  Endocrine: No heat intolerance, no cold intolerance, no polyuria, no polydipsia  Psych: No change in mood or affect. No depression or anxiety.  No memory loss.   ALLERGIES:   Allergies  Allergen Reactions  . Niacin And Related Other (See Comments)    Whelps and skin flushed, mouth tingling  . Tolectin [Tolmetin] Rash    PAST MEDICAL HISTORY: Past Medical History:  Diagnosis Date  . Anemia   . Anginal pain (HCC)    pt relates to stress pt was seen by Dr Rennis Golden 3 wks ago was given metoprolol   . Bronchitis    hx of   . CAD (coronary artery disease)    PCI to LAD in 2010, mild residual disease in LCX and RCA  . Cataracts, bilateral   . Diabetes mellitus without complication (HCC)   . Family history of adverse reaction to anesthesia    pts mother had difficulty awakening   . GERD (gastroesophageal reflux disease)   . Hyperlipidemia LDL goal < 70   . Hypertension   . Lumbar back pain   . Numbness    left leg and foot   . Plantar fascia rupture    Left Foot  . Sleep apnea    uses CPAP     PAST SURGICAL HISTORY: Past Surgical History:  Procedure Laterality Date  . ABDOMINAL HYSTERECTOMY    . BREAST CYST ASPIRATION  1995  . CAROTID STENT  2009   pt denies   . CORONARY ANGIOPLASTY WITH STENT PLACEMENT  2010and 10-02-2012  Stent DES, Xience to prox. LAD  . DOPPLER ECHOCARDIOGRAPHY  08/01/2009   EF=>55%,LV normal  . LEFT HEART CATHETERIZATION WITH CORONARY ANGIOGRAM N/A 10/02/2012   Procedure: LEFT HEART CATHETERIZATION WITH CORONARY ANGIOGRAM;  Surgeon: Runell Gess, MD;  Location: [facility] CATH LAB;  Service: Cardiovascular;  Laterality: N/A;  . lower arterial duplex  06/20/10   abi's normal,rgt 0.98,lft 1.06;bilateral PVRs normal  . LUMBAR LAMINECTOMY/DECOMPRESSION MICRODISCECTOMY Left 01/06/2013   Procedure: MICRO LUMBAR DECOMPRESSION L4-5 AND L5-S1;  Surgeon: Javier Docker, MD;  Location: [facility]  ORS;  Service: Orthopedics;  Laterality: Left;  . LUMBAR LAMINECTOMY/DECOMPRESSION MICRODISCECTOMY Left 10/12/2014   Procedure: REVISION MICRO LUMBAR/DECOMPRESSION L4-5 LEFT ;  Surgeon: Jene Every, MD;  Location: [facility] ORS;  Service: Orthopedics;  Laterality: Left;  . NM MYOCAR PERF WALL MOTION  09/22/2008   lexiscan-EF 83%; glogal LV systolic fx is norm. ,evidence of mild ischemia basal anterior,midanterior and apical lateral region(s).   . ORIF PATELLA Right 08/29/2016   Procedure: OPEN REDUCTION INTERNAL (ORIF) FIXATION RIGHT PATELLA;  Surgeon: Samson Frederic, MD;  Location: [facility] ORS;  Service: Orthopedics;  Laterality: Right;  Adductor Block  . TUBAL LIGATION    . TYMPANOPLASTY Bilateral   . UVULOPALATOPHARYNGOPLASTY     pt denies     MEDICATIONS AT HOME: Prior to Admission medications   Medication Sig Start Date End Date Taking? Authorizing Provider  acetaminophen (TYLENOL) 500 MG tablet Take 500 mg by mouth every 6 (six) hours as needed for mild pain or headache.   Yes [provider]  aspirin EC 81 MG EC tablet Take 1 tablet (81 mg total) by mouth daily. 08/30/16  Yes Swinteck, Arlys John, MD  calcium carbonate (OS-CAL) 1250 (500 Ca) MG chewable tablet Chew 1 tablet by mouth daily.   Yes [provider]  cetirizine (ZYRTEC) 10 MG chewable tablet Chew 10 mg by mouth daily as needed for allergies.   Yes [provider]  cholecalciferol (VITAMIN D) 1000 UNITS tablet Take 1,000 Units by mouth daily.   Yes [provider]  clopidogrel (PLAVIX) 75 MG tablet TAKE 1 TABLET BY MOUTH DAILY. Patient taking differently: Take 75 mg by mouth daily.  02/26/17  Yes Hilty, Lisette Abu, MD  diclofenac sodium (VOLTAREN) 1 % GEL Apply 2 grams on left ankle/ heel 2-3x daily as needed Patient taking differently: Apply 2 g topically 4 (four) times daily.  10/23/17  Yes Copland, [name], MD  famotidine (PEPCID) 20 MG tablet TAKE 1 TABLET BY MOUTH 2 TIMES DAILY. Patient taking  differently: Take 20 mg by mouth 2 (two) times daily.  01/01/18  Yes Hilty, Lisette Abu, MD  FLUoxetine (PROZAC) 20 MG capsule Take 1 capsule (20 mg total) by mouth daily. 10/23/17  Yes Copland, [name], MD  fluticasone (FLONASE) 50 MCG/ACT nasal spray Place 1 spray into both nostrils daily as needed for allergies or rhinitis.   Yes [provider]  metFORMIN (GLUCOPHAGE-XR) 500 MG 24 hr tablet TAKE 1 TABLET (500 MG TOTAL) BY MOUTH DAILY WITH BREAKFAST. 09/02/17  Yes Copland, [name], MD  metoprolol succinate (TOPROL-XL) 25 MG 24 hr tablet TAKE 1/2 TABLET BY MOUTH DAILY Patient taking differently: Take 12.5 mg by mouth daily.  11/05/17  Yes Hilty, Lisette Abu, MD  Multiple Vitamin (MULTIVITAMIN WITH MINERALS) TABS tablet Take 1 tablet by mouth daily.   Yes [provider]  simvastatin (ZOCOR) 40 MG tablet Take 1 tablet (40 mg total) by mouth every morning. 10/03/17  Yes Copland, [name],  MD  telmisartan (MICARDIS) 40 MG tablet TAKE 1 TABLET BY MOUTH EVERY MORNING Patient taking differently: Take 40 mg by mouth daily.  01/27/18  Yes Hilty, Lisette Abu, MD  diazepam (VALIUM) 2 MG tablet TAKE 1 TABLET BY MOUTH EVERY 12 hours as needed for anxiety Patient taking differently: Take 2 mg by mouth every 12 (twelve) hours as needed for muscle spasms.  12/23/16   Copland, [name], MD  nitroGLYCERIN (NITROSTAT) 0.4 MG SL tablet Place 1 tablet (0.4 mg total) under the tongue every 5 (five) minutes x 3 doses as needed for chest pain. 12/12/16   Abelino Derrick, PA-C  ondansetron (ZOFRAN) 4 MG tablet Take 1 tablet (4 mg total) by mouth every 8 (eight) hours as needed. Patient taking differently: Take 4 mg by mouth every 8 (eight) hours as needed for nausea or vomiting.  06/12/17   Tegeler, Canary Brim, MD    FAMILY HISTORY: Family History  Problem Relation Age of Onset  . Coronary artery disease Mother   . Rheum arthritis Mother   . Dementia Mother   . Heart attack Father   . Hypertension  Father   . Hyperlipidemia Father   . Other Father        MVA  . Hypertension Brother   . Cancer Brother   . Heart disease Brother   . Cancer Paternal Grandmother        stomach  . Diabetes Paternal Grandfather   . Breast cancer Neg Hx     SOCIAL HISTORY:  reports that she has never smoked. She has never used smokeless tobacco. She reports that she drinks alcohol. She reports that she does not use drugs.  PHYSICAL EXAM: Blood pressure (!) 144/46, pulse 62, temperature 98.8 F (37.1 C), temperature source Oral, resp. rate (!) 22, height 5' 1.5" (1.562 m), weight 94.3 kg, SpO2 100 %.  General appearance :Awake, alert, not in any distress. Speech Clear. Not toxic Looking Eyes:, pupils equally reactive to light and accomodation,no scleral icterus.Pink conjunctiva HEENT: Atraumatic and Normocephalic Neck: supple, no JVD. No cervical lymphadenopathy. No thyromegaly Resp:Good air entry bilaterally, no added sounds  CVS: S1 S2 regular, no murmurs.  GI: Bowel sounds present, Non tender and not distended with no gaurding, rigidity or rebound.No organomegaly Extremities: B/L Lower Ext shows no edema, both legs are warm to touch Neurology:  speech clear,Non focal, sensation is grossly intact. Psychiatric: Normal judgment and insight. Alert and oriented x 3. Normal mood. Musculoskeletal:gait appears to be normal.No digital cyanosis Skin:No Rash, warm and dry Wounds:N/A  LABS ON ADMISSION:  I have personally reviewed following labs and imaging studies  CBC: Recent Labs  Lab 01/31/18 1345  WBC 11.1*  NEUTROABS 4.7  HGB 12.4  HCT 41.7  MCV 82.7  PLT 336    Basic Metabolic Panel: Recent Labs  Lab 01/31/18 1345  NA 136  K 3.7  CL 102  CO2 25  GLUCOSE 132*  BUN 12  CREATININE 0.74  CALCIUM 10.4*    GFR: Estimated Creatinine Clearance: 71.3 mL/min (by C-G formula based on SCr of 0.74 mg/dL).  Liver Function Tests: Recent Labs  Lab 01/31/18 1345  AST 26  ALT 31    ALKPHOS 96  BILITOT 0.7  PROT 7.0  ALBUMIN 4.4   No results for input(s): LIPASE, AMYLASE in the last 168 hours. No results for input(s): AMMONIA in the last 168 hours.  Coagulation Profile: No results for input(s): INR, PROTIME in the last 168 hours.  Cardiac  Enzymes: No results for input(s): CKTOTAL, CKMB, CKMBINDEX, TROPONINI in the last 168 hours.  BNP (last 3 results) No results for input(s): PROBNP in the last 8760 hours.  HbA1C: No results for input(s): HGBA1C in the last 72 hours.  CBG: No results for input(s): GLUCAP in the last 168 hours.  Lipid Profile: No results for input(s): CHOL, HDL, LDLCALC, TRIG, CHOLHDL, LDLDIRECT in the last 72 hours.  Thyroid Function Tests: No results for input(s): TSH, T4TOTAL, FREET4, T3FREE, THYROIDAB in the last 72 hours.  Anemia Panel: No results for input(s): VITAMINB12, FOLATE, FERRITIN, TIBC, IRON, RETICCTPCT in the last 72 hours.  Urine analysis:    Component Value Date/Time   COLORURINE YELLOW 06/12/2017 1905   APPEARANCEUR CLEAR 06/12/2017 1905   LABSPEC <1.005 (L) 06/12/2017 1905   PHURINE 6.5 06/12/2017 1905   GLUCOSEU NEGATIVE 06/12/2017 1905   HGBUR NEGATIVE 06/12/2017 1905   BILIRUBINUR NEGATIVE 06/12/2017 1905   KETONESUR NEGATIVE 06/12/2017 1905   PROTEINUR NEGATIVE 06/12/2017 1905   UROBILINOGEN 0.2 10/01/2012 2305   NITRITE NEGATIVE 06/12/2017 1905   LEUKOCYTESUR NEGATIVE 06/12/2017 1905    Sepsis Labs: Lactic Acid, Venous No results found for: LATICACIDVEN   Microbiology: No results found for this or any previous visit (from the past 240 hour(s)).    RADIOLOGIC STUDIES ON ADMISSION: Ct Angio Chest Aorta W And/or Wo Contrast  Result Date: 01/31/2018 CLINICAL DATA:  Right-sided chest pain radiating around back. Shortness of breath. Evaluate for thoracic aortic dissection. EXAM: CT ANGIOGRAPHY CHEST WITH CONTRAST TECHNIQUE: Multidetector CT imaging of the chest was performed using the standard  protocol during bolus administration of intravenous contrast. Multiplanar CT image reconstructions and MIPs were obtained to evaluate the vascular anatomy. CONTRAST:  ISOVUE-370 IOPAMIDOL (ISOVUE-370) INJECTION 76% COMPARISON:  Chest radiograph 06/12/2017 FINDINGS: Cardiovascular: Normal heart size. Dense coronary arterial vascular calcifications. Noncontrast imaging through the chest demonstrates no acute intramural hematoma. Post-contrast imaging through the chest demonstrates no acute thoracic aortic dissection. No evidence for central pulmonary embolus. Mediastinum/Nodes: No enlarged axillary, mediastinal or hilar lymphadenopathy. Small hiatal hernia. Normal appearance of the esophagus. Lungs/Pleura: Central airways are patent. No large area of pulmonary consolidation. No pleural effusion or pneumothorax. Upper Abdomen: Liver is diffusely low in attenuation compatible with steatosis. No acute upper abdominal process. Musculoskeletal: Thoracic spine degenerative changes. No aggressive or acute appearing osseous lesions. Review of the MIP images confirms the above findings. IMPRESSION: No evidence for acute thoracic aortic dissection. No evidence for central pulmonary embolus. No acute process within the chest. Aortic Atherosclerosis (ICD10-I70.0). Hepatic steatosis. Electronically Signed   By: Annia Belt M.D.   On: 01/31/2018 17:06    I have personally reviewed images of CT angiogram of the chest  EKG:  Personally reviewed.  RBBB pattern-does not appear much different from her EKG in April 2019  ASSESSMENT AND PLAN: Chest pain: Mostly with atypical features-given history of CAD and other multiple medical problems-reasonable to admit for overnight observation.  Telemetry completely chest pain-free cycle troponins x3.  EKG does not appear to be much different from her EKG in April 2019.  Continue antiplatelet agents/beta-blocker/statins-and follow clinical course.  DM-2: Hold metformin-Place on  SSI and follow.  Hypertension: Continue with Avapro, metoprolol-and follow BP trend  Dyslipidemia: Continue statin  OSA: CPAP nightly  Morbid obesity  Further plan will depend as patient's clinical course evolves and further radiologic and laboratory data become available. Patient will be monitored closely.  Above noted plan was discussed with patient/friend face to face  at bedside, they were in agreement.   CONSULTS: None  DVT Prophylaxis: Prophylactic Lovenox  Code Status: Full Code  Disposition Plan:  Discharge back home possibly in 1 day, depending on clinical course  Admission status: Observation going to tele  Total time spent  35 minutes.Greater than 50% of this time was spent in counseling, explanation of diagnosis, planning of further management, and coordination of care.   Jeoffrey Massed [facility] Pager [phone]  If 7PM-7AM, please contact night-coverage [email or web address] Password [email or web address] 01/31/2018, 5:51 PM

## 2018-01-31 NOTE — ED Provider Notes (Signed)
  Physical Exam  BP (!) 144/46   Pulse 62   Temp 98.8 F (37.1 C) (Oral)   Resp (!) 22   Ht 5' 1.5" (1.562 m)   Wt 94.3 kg   LMP  (LMP Unknown)   SpO2 100%   BMI 38.66 kg/m   Physical Exam  Constitutional: She appears well-developed and well-nourished. No distress.  HENT:  Head: Normocephalic and atraumatic.  Eyes: Conjunctivae and EOM are normal. No scleral icterus.  Neck: Normal range of motion.  Pulmonary/Chest: Effort normal. No respiratory distress.  Neurological: She is alert.  Skin: No rash noted. She is not diaphoretic.  Psychiatric: She has a normal mood and affect.  Nursing note and vitals reviewed.   ED Course/Procedures     Procedures  MDM  3:41 PM Care handed off from previous provider, J.Ward, PA-C.  Please see their note for further detail.  Briefly, patient reports right-sided pleuritic chest pain beginning in her back and radiating to her chest.  Symptoms have been going on for 2 days ago.  She reports associated shortness of breath and cough.  Work-up significant for EKG showing new right bundle branch block.  Troponin unremarkable.  CBC, BMP unremarkable.  CT dissection study is pending.  However, patient will need to be admitted for ACS rule out due to HEART score of 5.   5:26 PM CT dissection study is negative for acute abnormality. Spoke to hospitalist, Dr. Jerral Ralph.  He will come down to evaluate the patient and determine whether she needs to be admitted.  5:47 PM Hospitalist to admit patient.    Portions of this note were generated with Scientist, clinical (histocompatibility and immunogenetics). Dictation errors may occur despite best attempts at proofreading.    Dietrich Pates, PA-C 01/31/18 1747    Charlynne Pander, MD 01/31/18 [phone]

## 2018-01-31 NOTE — ED Provider Notes (Signed)
[facility] EMERGENCY DEPARTMENT Provider Note   CSN: [id number] Arrival date & time: 01/31/18  1332     History   Chief Complaint Chief Complaint  Patient presents with  . Back Pain  . Shortness of Breath    HPI [name] is a 67 y.o. female.  The history is provided by the patient and medical records. No language interpreter was used.  Back Pain    Shortness of Breath  Associated symptoms include cough.   [name] is a 67 y.o. female  with a PMH of CAD, DM, HTN, HLD who presents to the Emergency Department complaining of constant thoracic right-sided back pain radiating to the right chest which began yesterday. Associated with shortness of breath. Pain became worse today. Worse with deep breathing. No medications taken prior to arrival for symptoms. Denies abdominal pain, n/v/d, fever. Has had a slight cough for the last day or so as well. Denies hx of similar.    Past Medical History:  Diagnosis Date  . Anemia   . Anginal pain (HCC)    pt relates to stress pt was seen by Dr Rennis Golden 3 wks ago was given metoprolol   . Bronchitis    hx of   . CAD (coronary artery disease)    PCI to LAD in 2010, mild residual disease in LCX and RCA  . Cataracts, bilateral   . Diabetes mellitus without complication (HCC)   . Family history of adverse reaction to anesthesia    pts mother had difficulty awakening   . GERD (gastroesophageal reflux disease)   . Hyperlipidemia LDL goal < 70   . Hypertension   . Lumbar back pain   . Numbness    left leg and foot   . Plantar fascia rupture    Left Foot  . Sleep apnea    uses CPAP     Patient Active Problem List   Diagnosis Date Noted  . OSA (obstructive sleep apnea) 11/03/2017  . Morbid obesity (HCC) 11/03/2017  . Coronary artery disease involving native coronary artery of native heart with unstable angina pectoris (HCC) 11/03/2017  . Insomnia 11/03/2017  . Inadequate sleep hygiene 11/03/2017  . Anxiety  07/21/2017  . Dizziness 10/02/2016  . Right patella fracture 08/29/2016  . Acquired foot deformity, left 07/18/2016  . Osteopenia 11/10/2015  . HNP (herniated nucleus pulposus), lumbar 10/12/2014  . Spinal stenosis at L4-L5 level 10/12/2014  . OSA on CPAP 07/29/2014  . Iron deficiency anemia 07/29/2014  . DM (diabetes mellitus) with complications (HCC) 07/29/2014  . Environmental and seasonal allergies 04/28/2014  . Spinal stenosis, lumbar region, with neurogenic claudication 01/06/2013  . Obesity, morbid, BMI 40.0-49.9 (HCC) 10/20/2012  . Chest pain with moderate risk of acute coronary syndrome 10/02/2012  . CAD S/P percutaneous coronary angioplasty   . Hyperlipidemia with target LDL less than 70   . Essential hypertension 04/16/2012    Past Surgical History:  Procedure Laterality Date  . ABDOMINAL HYSTERECTOMY    . BREAST CYST ASPIRATION  1995  . CAROTID STENT  2009   pt denies   . CORONARY ANGIOPLASTY WITH STENT PLACEMENT  2010and 10-02-2012   Stent DES, Xience to prox. LAD  . DOPPLER ECHOCARDIOGRAPHY  08/01/2009   EF=>55%,LV normal  . LEFT HEART CATHETERIZATION WITH CORONARY ANGIOGRAM N/A 10/02/2012   Procedure: LEFT HEART CATHETERIZATION WITH CORONARY ANGIOGRAM;  Surgeon: Runell Gess, MD;  Location: [facility] CATH LAB;  Service: Cardiovascular;  Laterality: N/A;  . lower  arterial duplex  06/20/10   abi's normal,rgt 0.98,lft 1.06;bilateral PVRs normal  . LUMBAR LAMINECTOMY/DECOMPRESSION MICRODISCECTOMY Left 01/06/2013   Procedure: MICRO LUMBAR DECOMPRESSION L4-5 AND L5-S1;  Surgeon: Javier Docker, MD;  Location: [facility] ORS;  Service: Orthopedics;  Laterality: Left;  . LUMBAR LAMINECTOMY/DECOMPRESSION MICRODISCECTOMY Left 10/12/2014   Procedure: REVISION MICRO LUMBAR/DECOMPRESSION L4-5 LEFT ;  Surgeon: Jene Every, MD;  Location: [facility] ORS;  Service: Orthopedics;  Laterality: Left;  . NM MYOCAR PERF WALL MOTION  09/22/2008   lexiscan-EF 83%; glogal LV systolic fx is norm. ,evidence  of mild ischemia basal anterior,midanterior and apical lateral region(s).   . ORIF PATELLA Right 08/29/2016   Procedure: OPEN REDUCTION INTERNAL (ORIF) FIXATION RIGHT PATELLA;  Surgeon: Samson Frederic, MD;  Location: [facility] OR[facility];  Service: Orthopedics;  Laterality: Right;  Adductor Block  . TUBAL LIGATION    . TYMPANOPLASTY Bilateral   . UVULOPALATOPHARYNGOPLASTY     pt denies      OB History   None      Home Medications    Prior to Admission medications   Medication Sig Start Date End Date Taking? Authorizing Provider  acetaminophen (TYLENOL) 500 MG tablet Take 500 mg by mouth every 6 (six) hours as needed for mild pain or headache.    [provider]  aspirin EC 81 MG EC tablet Take 1 tablet (81 mg total) by mouth daily. 08/30/16   Swinteck, Arlys John, MD  calcium carbonate (OS-CAL) 1250 (500 Ca) MG chewable tablet Chew 1 tablet by mouth daily.    [provider]  cetirizine (ZYRTEC) 10 MG chewable tablet Chew 10 mg by mouth daily as needed for allergies.    [provider]  cholecalciferol (VITAMIN D) 1000 UNITS tablet Take 1,000 Units by mouth daily.    [provider]  clopidogrel (PLAVIX) 75 MG tablet TAKE 1 TABLET BY MOUTH DAILY. 02/26/17   Hilty, Lisette Abu, MD  diazepam (VALIUM) 2 MG tablet TAKE 1 TABLET BY MOUTH EVERY 12 hours as needed for anxiety 12/23/16   Copland, [name], MD  diclofenac sodium (VOLTAREN) 1 % GEL Apply 2 grams on left ankle/ heel 2-3x daily as needed 10/23/17   Copland, [name], MD  famotidine (PEPCID) 20 MG tablet TAKE 1 TABLET BY MOUTH 2 TIMES DAILY. 01/01/18   Hilty, Lisette Abu, MD  FLUoxetine (PROZAC) 20 MG capsule Take 1 capsule (20 mg total) by mouth daily. 10/23/17   Copland, [name], MD  fluticasone (FLONASE) 50 MCG/ACT nasal spray Place 1 spray into both nostrils daily as needed for allergies or rhinitis.    [provider]  metFORMIN (GLUCOPHAGE-XR) 500 MG 24 hr tablet TAKE 1 TABLET (500 MG TOTAL) BY MOUTH DAILY  WITH BREAKFAST. 09/02/17   Copland, [name], MD  metoprolol succinate (TOPROL-XL) 25 MG 24 hr tablet TAKE 1/2 TABLET BY MOUTH DAILY 11/05/17   Hilty, Lisette Abu, MD  Multiple Vitamin (MULTIVITAMIN WITH MINERALS) TABS tablet Take 1 tablet by mouth daily.    [provider]  nitroGLYCERIN (NITROSTAT) 0.4 MG SL tablet Place 1 tablet (0.4 mg total) under the tongue every 5 (five) minutes x 3 doses as needed for chest pain. 12/12/16   Abelino Derrick, PA-C  ondansetron (ZOFRAN) 4 MG tablet Take 1 tablet (4 mg total) by mouth every 8 (eight) hours as needed. 06/12/17   Tegeler, Canary Brim, MD  simvastatin (ZOCOR) 40 MG tablet Take 1 tablet (40 mg total) by mouth every morning. 10/03/17   Copland, [name],  MD  telmisartan (MICARDIS) 40 MG tablet TAKE 1 TABLET BY MOUTH EVERY MORNING 01/27/18   Hilty, Lisette Abu, MD    Family History Family History  Problem Relation Age of Onset  . Coronary artery disease Mother   . Rheum arthritis Mother   . Dementia Mother   . Heart attack Father   . Hypertension Father   . Hyperlipidemia Father   . Other Father        MVA  . Hypertension Brother   . Cancer Brother   . Heart disease Brother   . Cancer Paternal Grandmother        stomach  . Diabetes Paternal Grandfather   . Breast cancer Neg Hx     Social History Social History   Tobacco Use  . Smoking status: Never Smoker  . Smokeless tobacco: Never Used  Substance Use Topics  . Alcohol use: Yes    Comment: occasional, 1 a month wine  . Drug use: No     Allergies   Niacin and related and Tolectin [tolmetin]   Review of Systems Review of Systems  Respiratory: Positive for cough and shortness of breath.   Musculoskeletal: Positive for back pain.  All other systems reviewed and are negative.    Physical Exam Updated Vital Signs BP (!) 144/46   Pulse 62   Temp 98.8 F (37.1 C) (Oral)   Resp (!) 22   Ht 5' 1.5" (1.562 m)   Wt 94.3 kg   LMP  (LMP Unknown)   SpO2 100%   BMI  38.66 kg/m   Physical Exam  Constitutional: She is oriented to person, place, and time. She appears well-developed and well-nourished. No distress.  HENT:  Head: Normocephalic and atraumatic.  Cardiovascular: Normal rate, regular rhythm and normal heart sounds.  No murmur heard. Pulmonary/Chest: Effort normal and breath sounds normal. No respiratory distress.  Abdominal: Soft. She exhibits no distension. There is no tenderness.  Musculoskeletal:  Tenderness to T spine and right side of the back. No overlying skin changes.   Neurological: She is alert and oriented to person, place, and time.  Skin: Skin is warm and dry.  Nursing note and vitals reviewed.    ED Treatments / Results  Labs (all labs ordered are listed, but only abnormal results are displayed) Labs Reviewed  COMPREHENSIVE METABOLIC PANEL - Abnormal; Notable for the following components:      Result Value   Glucose, Bld 132 (*)    Calcium 10.4 (*)    All other components within normal limits  CBC WITH DIFFERENTIAL/PLATELET - Abnormal; Notable for the following components:   WBC 11.1 (*)    MCH 24.6 (*)    MCHC 29.7 (*)    Lymphs Abs 4.7 (*)    Eosinophils Absolute 0.7 (*)    All other components within normal limits  I-STAT TROPONIN, ED    EKG EKG Interpretation  Date/Time:  Saturday January 31 2018 13:45:11 EST Ventricular Rate:  69 PR Interval:    QRS Duration: 150 QT Interval:  432 QTC Calculation: 463 R Axis:   97 Text Interpretation:  Sinus rhythm RBBB and LPFB rbbb New since previous tracing Confirmed by Doug Sou [phone]) on 01/31/2018 1:59:41 PM   Radiology No results found.  Procedures Procedures (including critical care time)  Medications Ordered in ED Medications  iopamidol (ISOVUE-370) 76 % injection (has no administration in time range)  morphine 4 MG/ML injection 4 mg (2 mg Intravenous Given 01/31/18 1427)  Initial Impression / Assessment and Plan / ED Course  I have  reviewed the triage vital signs and the nursing notes.  Pertinent labs & imaging results that were available during my care of the patient were reviewed by me and considered in my medical decision making (see chart for details).    [name] is a 67 y.o. female who presents to ED for right sided back pain radiating around to chest wall beginning yesterday. Associated with shortness of breath. On exam, patient is afebrile, hemodynamically stable with tenderness to back. EKG with new RBBB. No acute ischemic changes. Labs including troponin reassuring. Given chest pain / back pain, CT angio was ordered. CXR ordered as well, however patient states that she would prefer to hold on CXR since she is also getting CT scan. Feel this is reasonable and CXR was discontinued at patient request. CT pending at shift change. Care assumed by oncoming provider, PA [place]. Case discussed, plan agreed upon. Will follow up on pending CT angio. If negative, heart score of 5 and will still likely need admission for chest pain rule out.    Patient seen by and discussed with Dr. Ethelda Chick who agrees with treatment plan.    Final Clinical Impressions(s) / ED Diagnoses   Final diagnoses:  Shortness of breath    ED Discharge Orders    None       , Chase Picket, PA-C 01/31/18 1551    Doug Sou, MD 01/31/18 1657

## 2018-01-31 NOTE — ED Triage Notes (Signed)
Pt arrives via POV complaining of mid thoracic back pain starting yesterday, denies heavy lifting. Reports shortness of breath starting this morning. Hx surgery on back x3 in L and S spine.  Started CPAP for sleep apnea this week.

## 2018-01-31 NOTE — ED Provider Notes (Signed)
Complains of right-sided pleuritic chest pain starting at the back and radiating around to the front right chest "along my bra line" onset 2 days ago pain is intermittent lasting a few minutes at a time.  She states it was worse when she walked on the steps earlier today.  Associated symptoms include shortness of breath.  Pain is pleuritic in nature, worse with deep inspiration.   Doug Sou, MD 01/31/18 1415

## 2018-02-01 ENCOUNTER — Encounter (HOSPITAL_COMMUNITY): Payer: Self-pay | Admitting: Student

## 2018-02-01 DIAGNOSIS — R071 Chest pain on breathing: Secondary | ICD-10-CM

## 2018-02-01 DIAGNOSIS — Z9861 Coronary angioplasty status: Secondary | ICD-10-CM | POA: Diagnosis not present

## 2018-02-01 DIAGNOSIS — I10 Essential (primary) hypertension: Secondary | ICD-10-CM

## 2018-02-01 DIAGNOSIS — I2510 Atherosclerotic heart disease of native coronary artery without angina pectoris: Secondary | ICD-10-CM | POA: Diagnosis not present

## 2018-02-01 DIAGNOSIS — E118 Type 2 diabetes mellitus with unspecified complications: Secondary | ICD-10-CM

## 2018-02-01 LAB — TROPONIN I: Troponin I: <0.03 ng/mL (ref <0.03)

## 2018-02-01 LAB — GLUCOSE, CAPILLARY: GLUCOSE-CAPILLARY: 126 mg/dL — AB (ref 70–99)

## 2018-02-01 LAB — HIV ANTIBODY (ROUTINE TESTING W REFLEX): HIV Screen 4th Generation wRfx: NONREACTIVE

## 2018-02-01 MED ORDER — DICLOFENAC SODIUM 1 % TD GEL: 2.0000 g | Freq: Four times a day (QID) | TRANSDERMAL | Status: DC

## 2018-02-01 MED ORDER — KETOROLAC TROMETHAMINE 15 MG/ML IJ SOLN
15.0000 mg | Freq: Four times a day (QID) | INTRAMUSCULAR | Status: DC | PRN
  Administered 2018-02-01: 15 mg via INTRAVENOUS
  Filled 2018-02-01: qty 1

## 2018-02-01 MED ORDER — DICLOFENAC SODIUM 1 % TD GEL
2.0000 g | Freq: Four times a day (QID) | TRANSDERMAL | Status: DC
  Administered 2018-02-01: 2 g via TOPICAL
  Filled 2018-02-01: qty 100

## 2018-02-01 NOTE — Consult Note (Addendum)
Cardiology Consult    Patient ID: [name]; [id number]; [birth year]   Admit date: 01/31/2018 Date of Consult: 02/01/2018  Primary Care Provider: Pearline Cables, MD Primary Cardiologist: Chrystie Nose, MD   Patient Profile    [name] is a 67 y.o. female with past medical history of CAD (s/p DES to LAD in 2010 with patent stent by cath in 2014, low-risk NST in 11/2016), HTN, HLD, Type 2 DM, OSA (on CPAP) and anxiety who is being seen today for the evaluation of chest pain at the request of Dr. Jerral Ralph.   History of Present Illness    Ms. Kittelson was last examined by Dr. Rennis Golden in 06/2017 and denied any recent chest pain or dyspnea on exertion at that time. She had recently been started on Fluoxetine by psychiatry for anxiety and agoraphobia. Did have a new RBBB by EKG at the time of her visit. No further cardiac testing was pursued at that time given her recent negative NST in 11/2016.  She presented to [facility] ED on 01/31/2018 for evaluation of chest discomfort starting the day prior to admission. In talking with the patient today, she reports having a sharp stabbing pain that has been radiating from her lower back into her right breast area. The pain has been worse with sudden movements and also deep breathing. No association with activity. During this encounter, her pain represents with turning her shoulders from side to side. She denies any recent dyspnea on exertion, orthopnea, PND, lower extremity edema, or palpitations. She does have chronic fatigue which is unchanged per her report.  Initial labs show WBC 11.1, Hgb 12.4, platelets 336, Na+ 136, K+ 3.7, and creatinine 0.74. Initial and cyclic troponin values have been negative. EKG shows NSR, HR 69, with RBBB and LAFB (RBBB new since 2018 but noted on tracing in 06/2017). CTA showed no evidence of an acute thoracic aortic dissection and no evidence of a PE. Was noted to have aortic atherosclerosis and hepatic  steatosis.   Past Medical History:  Diagnosis Date  . Anemia   . Arthritis   . Bronchitis    hx of   . CAD (coronary artery disease)    a. s/p DES to LAD in 2010 with patent stent by cath in 2014 b. low-risk NST in 11/2016  . Cataracts, bilateral   . Diabetes mellitus without complication (HCC)   . Family history of adverse reaction to anesthesia    pts mother had difficulty awakening   . GERD (gastroesophageal reflux disease)   . Hyperlipidemia LDL goal < 70   . Hypertension   . Lumbar back pain   . Numbness    left leg and foot   . Plantar fascia rupture    Left Foot  . Sleep apnea    uses CPAP     Past Surgical History:  Procedure Laterality Date  . ABDOMINAL HYSTERECTOMY    . BACK SURGERY    . BREAST CYST ASPIRATION  1995  . CAROTID STENT  2009   pt denies   . CORONARY ANGIOPLASTY WITH STENT PLACEMENT  2010and 10-02-2012   Stent DES, Xience to prox. LAD  . DOPPLER ECHOCARDIOGRAPHY  08/01/2009   EF=>55%,LV normal  . LEFT HEART CATHETERIZATION WITH CORONARY ANGIOGRAM N/A 10/02/2012   Procedure: LEFT HEART CATHETERIZATION WITH CORONARY ANGIOGRAM;  Surgeon: Runell Gess, MD;  Location: [facility] CATH LAB;  Service: Cardiovascular;  Laterality: N/A;  . lower arterial duplex  06/20/10  abi's normal,rgt 0.98,lft 1.06;bilateral PVRs normal  . LUMBAR LAMINECTOMY/DECOMPRESSION MICRODISCECTOMY Left 01/06/2013   Procedure: MICRO LUMBAR DECOMPRESSION L4-5 AND L5-S1;  Surgeon: Javier Docker, MD;  Location: [facility] ORS;  Service: Orthopedics;  Laterality: Left;  . LUMBAR LAMINECTOMY/DECOMPRESSION MICRODISCECTOMY Left 10/12/2014   Procedure: REVISION MICRO LUMBAR/DECOMPRESSION L4-5 LEFT ;  Surgeon: Jene Every, MD;  Location: [facility] ORS;  Service: Orthopedics;  Laterality: Left;  . NM MYOCAR PERF WALL MOTION  09/22/2008   lexiscan-EF 83%; glogal LV systolic fx is norm. ,evidence of mild ischemia basal anterior,midanterior and apical lateral region(s).   . ORIF PATELLA Right 08/29/2016    Procedure: OPEN REDUCTION INTERNAL (ORIF) FIXATION RIGHT PATELLA;  Surgeon: Samson Frederic, MD;  Location: [facility] ORS;  Service: Orthopedics;  Laterality: Right;  Adductor Block  . TUBAL LIGATION    . TYMPANOPLASTY Bilateral   . UVULOPALATOPHARYNGOPLASTY     pt denies      Home Medications:  Prior to Admission medications   Medication Sig Start Date End Date Taking? Authorizing Provider  acetaminophen (TYLENOL) 500 MG tablet Take 500 mg by mouth every 6 (six) hours as needed for mild pain or headache.   Yes [provider]  aspirin EC 81 MG EC tablet Take 1 tablet (81 mg total) by mouth daily. 08/30/16  Yes Swinteck, Arlys John, MD  calcium carbonate (OS-CAL) 1250 (500 Ca) MG chewable tablet Chew 1 tablet by mouth daily.   Yes [provider]  cetirizine (ZYRTEC) 10 MG chewable tablet Chew 10 mg by mouth daily as needed for allergies.   Yes [provider]  cholecalciferol (VITAMIN D) 1000 UNITS tablet Take 1,000 Units by mouth daily.   Yes [provider]  clopidogrel (PLAVIX) 75 MG tablet TAKE 1 TABLET BY MOUTH DAILY. Patient taking differently: Take 75 mg by mouth daily.  02/26/17  Yes Hilty, Lisette Abu, MD  diclofenac sodium (VOLTAREN) 1 % GEL Apply 2 grams on left ankle/ heel 2-3x daily as needed Patient taking differently: Apply 2 g topically 4 (four) times daily.  10/23/17  Yes Copland, [name], MD  famotidine (PEPCID) 20 MG tablet TAKE 1 TABLET BY MOUTH 2 TIMES DAILY. Patient taking differently: Take 20 mg by mouth 2 (two) times daily.  01/01/18  Yes Hilty, Lisette Abu, MD  FLUoxetine (PROZAC) 20 MG capsule Take 1 capsule (20 mg total) by mouth daily. 10/23/17  Yes Copland, [name], MD  fluticasone (FLONASE) 50 MCG/ACT nasal spray Place 1 spray into both nostrils daily as needed for allergies or rhinitis.   Yes [provider]  metFORMIN (GLUCOPHAGE-XR) 500 MG 24 hr tablet TAKE 1 TABLET (500 MG TOTAL) BY MOUTH DAILY WITH BREAKFAST. 09/02/17  Yes Copland,  [name], MD  metoprolol succinate (TOPROL-XL) 25 MG 24 hr tablet TAKE 1/2 TABLET BY MOUTH DAILY Patient taking differently: Take 12.5 mg by mouth daily.  11/05/17  Yes Hilty, Lisette Abu, MD  Multiple Vitamin (MULTIVITAMIN WITH MINERALS) TABS tablet Take 1 tablet by mouth daily.   Yes [provider]  simvastatin (ZOCOR) 40 MG tablet Take 1 tablet (40 mg total) by mouth every morning. 10/03/17  Yes Copland, [name], MD  telmisartan (MICARDIS) 40 MG tablet TAKE 1 TABLET BY MOUTH EVERY MORNING Patient taking differently: Take 40 mg by mouth daily.  01/27/18  Yes Hilty, Lisette Abu, MD  diazepam (VALIUM) 2 MG tablet TAKE 1 TABLET BY MOUTH EVERY 12 hours as needed for anxiety Patient taking differently: Take 2 mg by mouth every 12 (twelve)  hours as needed for muscle spasms.  12/23/16   Copland, [name], MD  nitroGLYCERIN (NITROSTAT) 0.4 MG SL tablet Place 1 tablet (0.4 mg total) under the tongue every 5 (five) minutes x 3 doses as needed for chest pain. 12/12/16   Abelino Derrick, PA-C  ondansetron (ZOFRAN) 4 MG tablet Take 1 tablet (4 mg total) by mouth every 8 (eight) hours as needed. Patient taking differently: Take 4 mg by mouth every 8 (eight) hours as needed for nausea or vomiting.  06/12/17   Tegeler, Canary Brim, MD    Inpatient Medications: Scheduled Meds: . aspirin EC  81 mg Oral Daily  . calcium carbonate  2 tablet Oral Daily  . cholecalciferol  1,000 Units Oral Daily  . clopidogrel  75 mg Oral Daily  . enoxaparin (LOVENOX) injection  40 mg Subcutaneous Q24H  . famotidine  20 mg Oral BID  . FLUoxetine  20 mg Oral Daily  . insulin aspart  0-9 Units Subcutaneous TID WC  . irbesartan  150 mg Oral Daily  . metoprolol succinate  12.5 mg Oral Daily  . multivitamin with minerals  1 tablet Oral Daily  . simvastatin  40 mg Oral Daily   Continuous Infusions:  PRN Meds: acetaminophen, diazepam, fluticasone, ketorolac, morphine injection, nitroGLYCERIN, ondansetron (ZOFRAN)  IV  Allergies:    Allergies  Allergen Reactions  . Niacin And Related Other (See Comments)    Whelps and skin flushed, mouth tingling  . Tolectin [Tolmetin] Rash    Social History:   Social History   Socioeconomic History  . Marital status: Married    Spouse name: [name]  . Number of children: 2  . Years of education: MSN  . Highest education level: Not on file  Occupational History  . Occupation: Chemical engineer: [employer]  Social Needs  . Financial resource strain: Not on file  . Food insecurity:    Worry: Not on file    Inability: Not on file  . Transportation needs:    Medical: Not on file    Non-medical: Not on file  Tobacco Use  . Smoking status: Never Smoker  . Smokeless tobacco: Never Used  Substance and Sexual Activity  . Alcohol use: Yes    Comment: occasional, 1 a month wine  . Drug use: No  . Sexual activity: Yes  Lifestyle  . Physical activity:    Days per week: Not on file    Minutes per session: Not on file  . Stress: Not on file  Relationships  . Social connections:    Talks on phone: Not on file    Gets together: Not on file    Attends religious service: Not on file    Active member of club or organization: Not on file    Attends meetings of clubs or organizations: Not on file    Relationship status: Not on file  . Intimate partner violence:    Fear of current or ex partner: Not on file    Emotionally abused: Not on file    Physically abused: Not on file    Forced sexual activity: Not on file  Other Topics Concern  . Not on file  Social History Narrative      Married to [place]. Education: [employer]. Exercise: Yes   Patient lives at home with her spouse. retired   Caffeine use: 1 drink of tea a day     Family History:    Family History  Problem Relation Age of  Onset  . Coronary artery disease Mother   . Rheum arthritis Mother   . Dementia Mother   . Heart attack Father   . Hypertension Father   . Hyperlipidemia Father    . Other Father        MVA  . Hypertension Brother   . Cancer Brother   . Heart disease Brother   . Cancer Paternal Grandmother        stomach  . Diabetes Paternal Grandfather   . Breast cancer Neg Hx       Review of Systems    General:  No chills, fever, night sweats or weight changes.  Cardiovascular:  No dyspnea on exertion, edema, orthopnea, palpitations, paroxysmal nocturnal dyspnea. Positive for chest pain.  Dermatological: No rash, lesions/masses Respiratory: No cough, dyspnea Urologic: No hematuria, dysuria Abdominal:   No nausea, vomiting, diarrhea, bright red blood per rectum, melena, or hematemesis Neurologic:  No visual changes, wkns, changes in mental status. All other systems reviewed and are otherwise negative except as noted above.  Physical Exam/Data    Vitals:   01/31/18 1411 01/31/18 1815 01/31/18 2354 02/01/18 0400  BP: (!) 144/46 127/80 (!) 155/46 (!) 132/51  Pulse: 62 63 (!) 56 62  Resp: (!) 22 20 17 18   Temp:  98.7 F (37.1 C) 97.9 F (36.6 C) 98 F (36.7 C)  TempSrc:  Oral Oral Oral  SpO2: 100% 98% 100% 96%  Weight:  94.3 kg    Height:  5' 1.5" (1.562 m)     No intake or output data in the 24 hours ending 02/01/18 0808 Filed Weights   01/31/18 1343 01/31/18 1815  Weight: 94.3 kg 94.3 kg   Body mass index is 38.65 kg/m.   General: Pleasant, NAD Psych: Normal affect. Neuro: Alert and oriented X 3. Moves all extremities spontaneously. HEENT: Normal  Neck: Supple without bruits or JVD. Lungs:  Resp regular and unlabored, CTA. Heart: RRR no s3, s4, or murmurs. Abdomen: Soft, non-tender, non-distended, BS + x 4.  Extremities: No clubbing, cyanosis or edema. DP/PT/Radials 2+ and equal bilaterally.   EKG:  The EKG was personally reviewed and demonstrates: NSR, HR 69, with RBBB and LAFB (RBBB new since 2018 but noted on tracing in 06/2017).   Labs/Studies     Relevant CV Studies:  Cardiac Catheterization: 09/2012 ANGIOGRAPHIC RESULTS:    1. Left main; normal  2. LAD; the proximal LAD stent was widely patent and the remainder of the vessel is free of significant disease 3. Left circumflex; nondominant and normal.  4. Right coronary artery; dominant the normal 5. Left ventriculography; RAO left ventriculogram was performed using  25 mL of Visipaque dye at 12 mL/second. The overall LVEF estimated  60 % Without wall motion abnormalities  IMPRESSION:widely patent proximal LAD stent with otherwise normal coronary arteries and normal LV function. I her chest pain was noncardiac and probably gastrointestinal. The sheath was removed and a TR band was placed on the right wrist to achieve patent hemostasis. The patient left the Cath Lab in stable condition. She will be hydrated for 2 hours and then discharged home. She will then followup with Dr. [place] Hilty as an outpatient.   NST: 11/2016  There was no ST segment deviation noted during stress.  This is a low risk study.  The left ventricular ejection fraction is hyperdynamic (>65%).  Nuclear stress EF: 78%.   1. Fixed small, mild apical lateral perfusion defect.  Normal wall motion suggests that this could be  artifact.  No evidence for ischemia.  2. EF > 65%, normal wall motion.   Low risk study.   Laboratory Data:  Chemistry Recent Labs  Lab 01/31/18 1345 01/31/18 1846  NA 136  --   K 3.7  --   CL 102  --   CO2 25  --   GLUCOSE 132*  --   BUN 12  --   CREATININE 0.74 0.74  CALCIUM 10.4*  --   GFRNONAA >60 >60  GFRAA >60 >60  ANIONGAP 9  --     Recent Labs  Lab 01/31/18 1345  PROT 7.0  ALBUMIN 4.4  AST 26  ALT 31  ALKPHOS 96  BILITOT 0.7   Hematology Recent Labs  Lab 01/31/18 1345 01/31/18 1846  WBC 11.1* 12.6*  RBC 5.04 4.76  HGB 12.4 11.5*  HCT 41.7 38.7  MCV 82.7 81.3  MCH 24.6* 24.2*  MCHC 29.7* 29.7*  RDW 14.5 14.3  PLT 336 341   Cardiac Enzymes Recent Labs  Lab 01/31/18 1846 01/31/18 2156 02/01/18 0052  TROPONINI <0.03  <0.03 <0.03    Recent Labs  Lab 01/31/18 1351  TROPIPOC 0.00    BNPNo results for input(s): BNP, PROBNP in the last 168 hours.  DDimer No results for input(s): DDIMER in the last 168 hours.  Radiology/Studies:  Ct Angio Chest Aorta W And/or Wo Contrast  Result Date: 01/31/2018 CLINICAL DATA:  Right-sided chest pain radiating around back. Shortness of breath. Evaluate for thoracic aortic dissection. EXAM: CT ANGIOGRAPHY CHEST WITH CONTRAST TECHNIQUE: Multidetector CT imaging of the chest was performed using the standard protocol during bolus administration of intravenous contrast. Multiplanar CT image reconstructions and MIPs were obtained to evaluate the vascular anatomy. CONTRAST:  ISOVUE-370 IOPAMIDOL (ISOVUE-370) INJECTION 76% COMPARISON:  Chest radiograph 06/12/2017 FINDINGS: Cardiovascular: Normal heart size. Dense coronary arterial vascular calcifications. Noncontrast imaging through the chest demonstrates no acute intramural hematoma. Post-contrast imaging through the chest demonstrates no acute thoracic aortic dissection. No evidence for central pulmonary embolus. Mediastinum/Nodes: No enlarged axillary, mediastinal or hilar lymphadenopathy. Small hiatal hernia. Normal appearance of the esophagus. Lungs/Pleura: Central airways are patent. No large area of pulmonary consolidation. No pleural effusion or pneumothorax. Upper Abdomen: Liver is diffusely low in attenuation compatible with steatosis. No acute upper abdominal process. Musculoskeletal: Thoracic spine degenerative changes. No aggressive or acute appearing osseous lesions. Review of the MIP images confirms the above findings. IMPRESSION: No evidence for acute thoracic aortic dissection. No evidence for central pulmonary embolus. No acute process within the chest. Aortic Atherosclerosis (ICD10-I70.0). Hepatic steatosis. Electronically Signed   By: Annia Belt M.D.   On: 01/31/2018 17:06     Assessment & Plan     1. Atypical  Chest Pain - presents with episodes of chest pain which have radiated from her lower back into her right breast region. Pain has been occurring with movement of her shoulders and lasting for several seconds. Pain also exacerbated with deep breathing. No association with exertion and does not resemble her prior anginal symptoms.  - Initial and cyclic troponin values have been negative. EKG shows NSR, HR 69, with RBBB and LAFB (RBBB new since 2018 but noted on tracing in 06/2017). CTA showed no evidence of an acute thoracic aortic dissection and no evidence of a PE. - overall, her pain seems atypical for a cardiac etiology and most consistent with MSK discomfort. Will order PRN Toradol and if improvement in her symptoms, would consider discharge on scheduled NSAIDS for several days (  previously was not taking due to being on DAPT). Would not anticipate further cardiac testing at this time.   2. CAD - s/p DES to LAD in 2010 with patent stent by cath in 2014. Most recent ischemic evaluation was a low-risk NST in 11/2016.  - cyclic enzymes have remained negative as outlined above.  - continue ASA and Plavix (has been continued on DAPT by her Primary Cardiologist) along with BB and statin therapy.   3. HTN - BP has overall been well controlled since admission. At 132/51 on most recent check.  - continue PTA Toprol-XL 12.5mg  daily and Micardis 40mg  daily.   4. HLD - Recent FLP in 10/2017 showed total cholesterol 163, triglycerides 421, HDL 29, and LDL 82. At goal LDL of less than 70. Has been continued on PTA Simvastatin 40mg  daily.   5. Type 2 DM - Hgb A1c at 7.2 in 10/2017. Per admitting team.  6. OSA - continued compliance with CPAP encouraged. , HTN, HLD, Type 2 DM, OSA (on CPAP) and anxiety who is being seen today for the evaluation of chest pain at the request of Dr. Jerral Ralph.   For questions or updates, please contact [facility] Please consult [email or web address] for contact info under  Cardiology/STEMI.  Signed, Ellsworth Lennox, PA-C 02/01/2018, 8:08 AM Pager: [phone]  Patient seen with PA, agree with the above note.   Patient presented with back pain radiating under right breast, has pleuritic component.  CTA chest showed no dissection or PE.  Cardiac enzymes negative, ECG unchanged RBBB.   Exam is unremarkable, no friction rub.   Very unlikely to be cardiac ischemia.  Would continue her prior to admission cardiac meds with regular followup with Dr. Rennis Golden.   Marca Ancona 02/01/2018 8:14 AM

## 2018-02-01 NOTE — Progress Notes (Signed)
Placed py on CPAP with auto settings max 18 aND MIN 6. Added H2O and pt tolerating well.

## 2018-02-01 NOTE — Discharge Summary (Signed)
Physician Discharge Summary  Oakleigh Hesketh Philemon WGN:[id number] DOB: [birth year] DOA: 01/31/2018  PCP: Pearline Cables, MD  Admit date: 01/31/2018 Discharge date: 02/01/2018  Admitted From: home Discharge disposition: home   Recommendations for Outpatient Follow-Up:   1. Cbc 1 week   Discharge Diagnosis:   Principal Problem:   Chest pain Active Problems:   Essential hypertension   CAD S/P percutaneous coronary angioplasty   Hyperlipidemia with target LDL less than 70   OSA on CPAP   DM (diabetes mellitus) with complications (HCC)   Morbid obesity (HCC)    Discharge Condition: Improved.  Diet recommendation: Low sodium, heart healthy.  Carbohydrate-modified  Wound care: None.  Code status: Full.   History of Present Illness:   [name] is a 67 y.o. female with medical history significant of CAD status post PCI to proximal LAD in 2010, morbid obesity, hypertension, dyslipidemia, DM-2-presenting with the above-noted complaints.  Per patient since yesterday-she started having right-sided back pain that radiated to the front of her chest.  Pain was intermittent-and occurred multiple times throughout the whole day yesterday.  Pain was described as sharp, with no associated nausea, vomiting or palpitations.  This morning, she continued to have pain but noted that she was having some pleuritic chest pain as well.  She subsequently presented to the ED where a CT angiogram of the chest did not show a PE nor did it show aortic dissection.  Given her prior history of CAD and other multiple medical comorbidities-hospitalist service were asked to admit this patient for overnight observation.    Hospital Course by Problem:   Chest pain:  -appears to be more like musculoskeletal origin -improved with toradol and voltaren gel -seen by cardiology-- no further inpt work up -CE negative Not consistent with shingles or a kidney stone  DM-2:  -resume home  meds  Hypertension:  -Continue with Avapro, metoprolol  Dyslipidemia: Continue statin  OSA: CPAP nightly  Morbid obesity Estimated body mass index is 38.65 kg/m as calculated from the following:   Height as of this encounter: 5' 1.5" (1.562 m).   Weight as of this encounter: 94.3 kg.   Medical Consultants:      Discharge Exam:   Vitals:   02/01/18 0400 02/01/18 0831  BP: (!) 132/51 (!) 138/54  Pulse: 62 (!) 56  Resp: 18 16  Temp: 98 F (36.7 C) 97.6 F (36.4 C)  SpO2: 96% 96%   Vitals:   01/31/18 1815 01/31/18 2354 02/01/18 0400 02/01/18 0831  BP: 127/80 (!) 155/46 (!) 132/51 (!) 138/54  Pulse: 63 (!) 56 62 (!) 56  Resp: 20 17 18 16   Temp: 98.7 F (37.1 C) 97.9 F (36.6 C) 98 F (36.7 C) 97.6 F (36.4 C)  TempSrc: Oral Oral Oral Oral  SpO2: 98% 100% 96% 96%  Weight: 94.3 kg     Height: 5' 1.5" (1.562 m)       General exam: Appears calm and comfortable.   The results of significant diagnostics from this hospitalization (including imaging, microbiology, ancillary and laboratory) are listed below for reference.     Procedures and Diagnostic Studies:   Ct Angio Chest Aorta W And/or Wo Contrast  Result Date: 01/31/2018 CLINICAL DATA:  Right-sided chest pain radiating around back. Shortness of breath. Evaluate for thoracic aortic dissection. EXAM: CT ANGIOGRAPHY CHEST WITH CONTRAST TECHNIQUE: Multidetector CT imaging of the chest was performed using the standard protocol during bolus administration of intravenous contrast. Multiplanar CT image  reconstructions and MIPs were obtained to evaluate the vascular anatomy. CONTRAST:  ISOVUE-370 IOPAMIDOL (ISOVUE-370) INJECTION 76% COMPARISON:  Chest radiograph 06/12/2017 FINDINGS: Cardiovascular: Normal heart size. Dense coronary arterial vascular calcifications. Noncontrast imaging through the chest demonstrates no acute intramural hematoma. Post-contrast imaging through the chest demonstrates no acute thoracic  aortic dissection. No evidence for central pulmonary embolus. Mediastinum/Nodes: No enlarged axillary, mediastinal or hilar lymphadenopathy. Small hiatal hernia. Normal appearance of the esophagus. Lungs/Pleura: Central airways are patent. No large area of pulmonary consolidation. No pleural effusion or pneumothorax. Upper Abdomen: Liver is diffusely low in attenuation compatible with steatosis. No acute upper abdominal process. Musculoskeletal: Thoracic spine degenerative changes. No aggressive or acute appearing osseous lesions. Review of the MIP images confirms the above findings. IMPRESSION: No evidence for acute thoracic aortic dissection. No evidence for central pulmonary embolus. No acute process within the chest. Aortic Atherosclerosis (ICD10-I70.0). Hepatic steatosis. Electronically Signed   By: Annia Belt M.D.   On: 01/31/2018 17:06     Labs:   Basic Metabolic Panel: Recent Labs  Lab 01/31/18 1345 01/31/18 1846  NA 136  --   K 3.7  --   CL 102  --   CO2 25  --   GLUCOSE 132*  --   BUN 12  --   CREATININE 0.74 0.74  CALCIUM 10.4*  --    GFR Estimated Creatinine Clearance: 71.3 mL/min (by C-G formula based on SCr of 0.74 mg/dL). Liver Function Tests: Recent Labs  Lab 01/31/18 1345  AST 26  ALT 31  ALKPHOS 96  BILITOT 0.7  PROT 7.0  ALBUMIN 4.4   No results for input(s): LIPASE, AMYLASE in the last 168 hours. No results for input(s): AMMONIA in the last 168 hours. Coagulation profile No results for input(s): INR, PROTIME in the last 168 hours.  CBC: Recent Labs  Lab 01/31/18 1345 01/31/18 1846  WBC 11.1* 12.6*  NEUTROABS 4.7  --   HGB 12.4 11.5*  HCT 41.7 38.7  MCV 82.7 81.3  PLT 336 341   Cardiac Enzymes: Recent Labs  Lab 01/31/18 1846 01/31/18 2156 02/01/18 0052  TROPONINI <0.03 <0.03 <0.03   BNP: Invalid input(s): POCBNP CBG: Recent Labs  Lab 01/31/18 2215 02/01/18 0657  GLUCAP 152* 126*   D-Dimer No results for input(s): DDIMER in the  last 72 hours. Hgb A1c No results for input(s): HGBA1C in the last 72 hours. Lipid Profile No results for input(s): CHOL, HDL, LDLCALC, TRIG, CHOLHDL, LDLDIRECT in the last 72 hours. Thyroid function studies No results for input(s): TSH, T4TOTAL, T3FREE, THYROIDAB in the last 72 hours.  Invalid input(s): FREET3 Anemia work up No results for input(s): VITAMINB12, FOLATE, FERRITIN, TIBC, IRON, RETICCTPCT in the last 72 hours. Microbiology No results found for this or any previous visit (from the past 240 hour(s)).   Discharge Instructions:   Discharge Instructions    Diet - low sodium heart healthy   Complete by:  As directed    Diet Carb Modified   Complete by:  As directed    Increase activity slowly   Complete by:  As directed      Allergies as of 02/01/2018      Reactions   Niacin And Related Other (See Comments)   Whelps and skin flushed, mouth tingling   Tolectin [tolmetin] Rash      Medication List    TAKE these medications   acetaminophen 500 MG tablet Commonly known as:  TYLENOL Take 500 mg by mouth every 6 (  six) hours as needed for mild pain or headache.   aspirin 81 MG EC tablet Take 1 tablet (81 mg total) by mouth daily.   calcium carbonate 1250 (500 Ca) MG chewable tablet Commonly known as:  OS-CAL Chew 1 tablet by mouth daily.   cetirizine 10 MG chewable tablet Commonly known as:  ZYRTEC Chew 10 mg by mouth daily as needed for allergies.   cholecalciferol 1000 units tablet Commonly known as:  VITAMIN D Take 1,000 Units by mouth daily.   clopidogrel 75 MG tablet Commonly known as:  PLAVIX TAKE 1 TABLET BY MOUTH DAILY.   diazepam 2 MG tablet Commonly known as:  VALIUM TAKE 1 TABLET BY MOUTH EVERY 12 hours as needed for anxiety What changed:    how much to take  how to take this  when to take this  reasons to take this  additional instructions   diclofenac sodium 1 % Gel Commonly known as:  VOLTAREN Apply 2 g topically 4 (four)  times daily.   famotidine 20 MG tablet Commonly known as:  PEPCID TAKE 1 TABLET BY MOUTH 2 TIMES DAILY.   FLUoxetine 20 MG capsule Commonly known as:  PROZAC Take 1 capsule (20 mg total) by mouth daily.   fluticasone 50 MCG/ACT nasal spray Commonly known as:  FLONASE Place 1 spray into both nostrils daily as needed for allergies or rhinitis.   metFORMIN 500 MG 24 hr tablet Commonly known as:  GLUCOPHAGE-XR TAKE 1 TABLET (500 MG TOTAL) BY MOUTH DAILY WITH BREAKFAST.   metoprolol succinate 25 MG 24 hr tablet Commonly known as:  TOPROL-XL TAKE 1/2 TABLET BY MOUTH DAILY   multivitamin with minerals Tabs tablet Take 1 tablet by mouth daily.   nitroGLYCERIN 0.4 MG SL tablet Commonly known as:  NITROSTAT Place 1 tablet (0.4 mg total) under the tongue every 5 (five) minutes x 3 doses as needed for chest pain.   ondansetron 4 MG tablet Commonly known as:  ZOFRAN Take 1 tablet (4 mg total) by mouth every 8 (eight) hours as needed. What changed:  reasons to take this   simvastatin 40 MG tablet Commonly known as:  ZOCOR Take 1 tablet (40 mg total) by mouth every morning.   telmisartan 40 MG tablet Commonly known as:  MICARDIS TAKE 1 TABLET BY MOUTH EVERY MORNING What changed:  when to take this      Follow-up Information    Copland, [name], MD Follow up in 1 week(s).   Specialty:  Family Medicine Contact information: [address] [place] [address] [phone]        Chrystie Nose, MD .   Specialty:  Cardiology Contact information: [address] [place] [address] [place] [address] [phone]            Time coordinating discharge: 25 min  Signed:  Joseph Art DO  [facility] 02/01/2018, 1:20 PM

## 2018-02-01 NOTE — Plan of Care (Signed)
Adequate for discahrge 

## 2018-02-02 ENCOUNTER — Telehealth: Payer: Self-pay

## 2018-02-02 NOTE — Telephone Encounter (Signed)
02/02/18  Transition Care Management Follow-up Telephone Call  ADMISSION DATE:  01/31/18  DISCHARGE DATE: 02/01/18  Patient to have CBC drawn during visit   How have you been since you were released from the hospital?  Still having pain in R shoulder which radiates to chest.  Heart attack ruled out per patient while in the hospital.   Do you understand why you were in the hospital? Yes   Do you understand the discharge instrcutions? Yes     Items Reviewed:  Medications reviewed: Yes  Allergies reviewed: Yes   Dietary changes reviewed: Low sodium,Heart healthy, Carbohydrate modified.   Referrals reviewed: Appointment scheduled for follow up visit.   Functional Questionnaire:  Activities of Daily Living (ADLs): Patient states she can perform all independently except needs assistance ambulating due to lightheadedness at times.  Any patient concerns? Who she needs to follow up with for back pain. Patient states CT scan showed she had hiatal hernia and patient states states she was not aware of this. Would like to discuss.   Confirmed importance and date/time of follow-up visits scheduled: Yes   Confirmed with patient if condition begins to worsen call PCP or go to the ER. YES. Chest pain, SOB esp.  Patient agreed.    Patient was given the office number and encouragred to call back with questions or concerns.yES

## 2018-02-03 NOTE — Progress Notes (Addendum)
[facility] [address], [address] [place], [place] [address] [phone]  Date:  02/05/2018   Name:  [name]   DOB:  08/30/1950   MRN:  [id number]  PCP:  Pearline Cables, MD    Chief Complaint: Hospitalization Follow-up (chest pain, back pain radiating to chest, right side, pressure, no improvement, CT normal, troponin slightly elevated)   History of Present Illness:  [name] is a 67 y.o. very pleasant female patient who presents with the following:  Hospital follow-up History of CAD/stent 2010, obesity, OSA, lower back pain/ spinal stenosis recently admitted overnight for CP but was ruled out.  Seen by Dr. Shirlee Latch who felt that her pain was most likely MSK in origin  Admit date: 01/31/2018 Discharge date: 02/01/2018  Recommendations for Outpatient Follow-Up:   1. Cbc 1 week  Discharge Diagnosis:   Principal Problem:   Chest pain Active Problems:   Essential hypertension   CAD S/P percutaneous coronary angioplasty   Hyperlipidemia with target LDL less than 70   OSA on CPAP   DM (diabetes mellitus) with complications (HCC)   Morbid obesity (HCC)   History of Present Illness:   [name] a 67 y.o.femalewith medical history significant ofCAD status post PCI to proximal LAD in 2010, morbid obesity, hypertension, dyslipidemia, DM-2-presenting with the above-noted complaints. Per patient since yesterday-she started having right-sided back pain that radiated to the front of her chest. Pain was intermittent-and occurred multiple times throughout the whole day yesterday. Pain was described as sharp, with no associated nausea, vomiting or palpitations. This morning, she continued to have pain but noted that she was having some pleuritic chest pain as well. She subsequently presented to the ED where a CT angiogram of the chest did not show a PE nor did it show aortic dissection. Given her prior  history of CAD and other multiple medical comorbidities-hospitalist service were asked to admit this patient for overnight observation.  Hospital Course by Problem:   Chest pain:  -appears to be more like musculoskeletal origin -improved with toradol and voltaren gel -seen by cardiology-- no further inpt work up -CE negative Not consistent with shingles or a kidney stone DM-2:  -resume home meds Hypertension:  -Continue with Avapro, metoprolol Dyslipidemia: Continue statin OSA: CPAP nightly Morbid obesity   [place] already followed- up with her cardiologist, Dr. Rennis Golden, yesterday.  He agreed that her pain seems to be MSK in origin:  ASSESSMENT: 1. Musculoskeletal chest pain 2. Chronic RBBB 3. Coronary artery disease status post DES the proximal LAD in 2010, low risk Myoview (11/2016)-LVEF 78% 4. Hypertension 5. Dyslipidemia 6. Obesity 7. Type 2 diabetes 8. Palpitations 9. OSA on CPAP 10. Anxiety PLAN: 1.  [name] recently was hospitalized for chest pain and ruled out for MI.  It seems that she has musculoskeletal chest pain which is reproducible along the thoracic spine.  The pain wraps around her anterior chest and I think she benefit from either anti-inflammatories (steroids or nonsteroidals) plus or minus muscle relaxants, given the fact that she recently had been doing some heavier lifting.  She does have an appointment with her PCP tomorrow to address this and I will defer that treatment to her.  Lab Results  Component Value Date   HGBA1C 7.2 (H) 10/23/2017   Flu: done 10/25, and shingles #1  She describes a pain that started in her right chest as of a week ago.  It seemed to go right through her. It got worse and they went to the ER.  She had eval including a CT angiogram- all looked ok, it was determined that her pain was likely MSK in origin  She found that toradol was quite helpful to her while inpt. However she was not sure if she can take NSAIDs or what she  should take now that she is home The pain is still there but will come and go Walking, bending over, trying to change position and stand from a chair She will splint if she needs to cough She took some flexeril which did help-however it is expired She also used some tessalon perles  Her pain is about the same as it was a week ago   She is able to use mobic without any issues  She did have a rash with Tolmetin years ago- she has tolerated other NSAIDS fine more recently She is able to take ibuprofen and aleve  Will also give her a few hydrocodone for more severe pain  She is reminded not to combine this with any other sedating medication No rash or skin sensitivity to suggest shingles  Her walking is making progress following her patellar operation last summer She is still slow but is able to walk farther distances independently   It is time for an A1c and we will also repeat her CBC as resquested by inpt team   Asa 81 plavix Valium pepcid prozac fonase Metformin toprolxl zofran zocor telmisartan   Patient Active Problem List   Diagnosis Date Noted  . Chest pain 01/31/2018  . OSA (obstructive sleep apnea) 11/03/2017  . Morbid obesity (HCC) 11/03/2017  . Coronary artery disease involving native coronary artery of native heart with unstable angina pectoris (HCC) 11/03/2017  . Insomnia 11/03/2017  . Inadequate sleep hygiene 11/03/2017  . Anxiety 07/21/2017  . Dizziness 10/02/2016  . Right patella fracture 08/29/2016  . Acquired foot deformity, left 07/18/2016  . Osteopenia 11/10/2015  . HNP (herniated nucleus pulposus), lumbar 10/12/2014  . Spinal stenosis at L4-L5 level 10/12/2014  . OSA on CPAP 07/29/2014  . Iron deficiency anemia 07/29/2014  . DM (diabetes mellitus) with complications (HCC) 07/29/2014  . Environmental and seasonal allergies 04/28/2014  . Spinal stenosis, lumbar region, with neurogenic claudication 01/06/2013  . Obesity, morbid, BMI 40.0-49.9 (HCC)  10/20/2012  . Chest pain with moderate risk of acute coronary syndrome 10/02/2012  . CAD S/P percutaneous coronary angioplasty   . Hyperlipidemia with target LDL less than 70   . Essential hypertension 04/16/2012    Past Medical History:  Diagnosis Date  . Anemia   . Arthritis   . Bronchitis    hx of   . CAD (coronary artery disease)    a. s/p DES to LAD in 2010 with patent stent by cath in 2014 b. low-risk NST in 11/2016  . Cataracts, bilateral   . Diabetes mellitus without complication (HCC)   . Family history of adverse reaction to anesthesia    pts mother had difficulty awakening   . GERD (gastroesophageal reflux disease)   . Hyperlipidemia LDL goal < 70   . Hypertension   . Lumbar back pain   . Numbness    left leg and foot   . Plantar fascia rupture    Left Foot  . Sleep apnea    uses CPAP     Past Surgical History:  Procedure Laterality Date  . ABDOMINAL HYSTERECTOMY    . BACK SURGERY    .  BREAST CYST ASPIRATION  1995  . CAROTID STENT  2009   pt denies   . CORONARY ANGIOPLASTY WITH STENT PLACEMENT  2010and 10-02-2012   Stent DES, Xience to prox. LAD  . DOPPLER ECHOCARDIOGRAPHY  08/01/2009   EF=>55%,LV normal  . LEFT HEART CATHETERIZATION WITH CORONARY ANGIOGRAM N/A 10/02/2012   Procedure: LEFT HEART CATHETERIZATION WITH CORONARY ANGIOGRAM;  Surgeon: Runell Gess, MD;  Location: [facility] CATH LAB;  Service: Cardiovascular;  Laterality: N/A;  . lower arterial duplex  06/20/10   abi's normal,rgt 0.98,lft 1.06;bilateral PVRs normal  . LUMBAR LAMINECTOMY/DECOMPRESSION MICRODISCECTOMY Left 01/06/2013   Procedure: MICRO LUMBAR DECOMPRESSION L4-5 AND L5-S1;  Surgeon: Javier Docker, MD;  Location: [facility] ORS;  Service: Orthopedics;  Laterality: Left;  . LUMBAR LAMINECTOMY/DECOMPRESSION MICRODISCECTOMY Left 10/12/2014   Procedure: REVISION MICRO LUMBAR/DECOMPRESSION L4-5 LEFT ;  Surgeon: Jene Every, MD;  Location: [facility] OR[facility];  Service: Orthopedics;  Laterality: Left;  . NM  MYOCAR PERF WALL MOTION  09/22/2008   lexiscan-EF 83%; glogal LV systolic fx is norm. ,evidence of mild ischemia basal anterior,midanterior and apical lateral region(s).   . ORIF PATELLA Right 08/29/2016   Procedure: OPEN REDUCTION INTERNAL (ORIF) FIXATION RIGHT PATELLA;  Surgeon: Samson Frederic, MD;  Location: [facility];  Service: Orthopedics;  Laterality: Right;  Adductor Block  . TUBAL LIGATION    . TYMPANOPLASTY Bilateral   . UVULOPALATOPHARYNGOPLASTY     pt denies     Social History   Tobacco Use  . Smoking status: Never Smoker  . Smokeless tobacco: Never Used  Substance Use Topics  . Alcohol use: Yes    Comment: occasional, 1 a month wine  . Drug use: No    Family History  Problem Relation Age of Onset  . Coronary artery disease Mother   . Rheum arthritis Mother   . Dementia Mother   . Heart attack Father   . Hypertension Father   . Hyperlipidemia Father   . Other Father        MVA  . Hypertension Brother   . Cancer Brother   . Heart disease Brother   . Cancer Paternal Grandmother        stomach  . Diabetes Paternal Grandfather   . Breast cancer Neg Hx     Allergies  Allergen Reactions  . Niacin And Related Other (See Comments)    Whelps and skin flushed, mouth tingling  . Tolectin [Tolmetin] Rash    Medication list has been reviewed and updated.  Current Outpatient Medications on File Prior to Visit  Medication Sig Dispense Refill  . acetaminophen (TYLENOL) 500 MG tablet Take 500 mg by mouth every 6 (six) hours as needed for mild pain or headache.    Marland Kitchen aspirin EC 81 MG EC tablet Take 1 tablet (81 mg total) by mouth daily. 30 tablet 1  . calcium carbonate (OS-CAL) 1250 (500 Ca) MG chewable tablet Chew 1 tablet by mouth daily.    . cetirizine (ZYRTEC) 10 MG chewable tablet Chew 10 mg by mouth daily as needed for allergies.    . cholecalciferol (VITAMIN D) 1000 UNITS tablet Take 1,000 Units by mouth daily.    . clopidogrel (PLAVIX) 75 MG tablet TAKE 1 TABLET BY  MOUTH DAILY. (Patient taking differently: Take 75 mg by mouth daily. ) 90 tablet 3  . diazepam (VALIUM) 2 MG tablet TAKE 1 TABLET BY MOUTH EVERY 12 hours as needed for anxiety (Patient taking differently: Take 2 mg by mouth every 12 (twelve) hours as needed  for muscle spasms. ) 40 tablet 1  . diclofenac sodium (VOLTAREN) 1 % GEL Apply 2 g topically 4 (four) times daily.    . famotidine (PEPCID) 20 MG tablet TAKE 1 TABLET BY MOUTH 2 TIMES DAILY. (Patient taking differently: Take 20 mg by mouth 2 (two) times daily. ) 180 tablet 1  . FLUoxetine (PROZAC) 20 MG capsule Take 1 capsule (20 mg total) by mouth daily. 90 capsule 3  . fluticasone (FLONASE) 50 MCG/ACT nasal spray Place 1 spray into both nostrils daily as needed for allergies or rhinitis.    . metFORMIN (GLUCOPHAGE-XR) 500 MG 24 hr tablet TAKE 1 TABLET (500 MG TOTAL) BY MOUTH DAILY WITH BREAKFAST. 90 tablet 1  . metoprolol succinate (TOPROL-XL) 25 MG 24 hr tablet TAKE 1/2 TABLET BY MOUTH DAILY (Patient taking differently: Take 12.5 mg by mouth daily. ) 45 tablet 3  . Multiple Vitamin (MULTIVITAMIN WITH MINERALS) TABS tablet Take 1 tablet by mouth daily.    . nitroGLYCERIN (NITROSTAT) 0.4 MG SL tablet Place 1 tablet (0.4 mg total) under the tongue every 5 (five) minutes x 3 doses as needed for chest pain. 25 tablet 3  . ondansetron (ZOFRAN) 4 MG tablet Take 1 tablet (4 mg total) by mouth every 8 (eight) hours as needed. (Patient taking differently: Take 4 mg by mouth every 8 (eight) hours as needed for nausea or vomiting. ) 12 tablet 0  . simvastatin (ZOCOR) 40 MG tablet Take 1 tablet (40 mg total) by mouth every morning. 90 tablet 1  . telmisartan (MICARDIS) 40 MG tablet TAKE 1 TABLET BY MOUTH EVERY MORNING (Patient taking differently: Take 40 mg by mouth daily. ) 90 tablet 1   No current facility-administered medications on file prior to visit.     Review of Systems:  As per HPI- otherwise negative. BP Readings from Last 3 Encounters:   02/05/18 (!) 132/58  02/04/18 (!) 147/48  02/01/18 (!) 138/54     Physical Examination: Vitals:   02/05/18 1537  BP: (!) 132/58  Pulse: 61  Resp: 16  Temp: 97.8 F (36.6 C)  SpO2: 97%   Vitals:   02/05/18 1537  Weight: 216 lb (98 kg)  Height: 5' 1.5" (1.562 m)   Body mass index is 40.15 kg/m. Ideal Body Weight: Weight in (lb) to have BMI = 25: 134.2  GEN: WDWN, NAD, Non-toxic, A & O x 3, obese, looks well  HEENT: Atraumatic, Normocephalic. Neck supple. No masses, No LAD. Bilateral TM wnl, oropharynx normal.  PEERL,EOMI.   Ears and Nose: No external deformity. CV: RRR, No M/G/R. No JVD. No thrill. No extra heart sounds. PULM: CTA B, no wheezes, crackles, rhonchi. No retractions. No resp. distress. No accessory muscle use. EXTR: No c/c/e NEURO slow, but improving-from right ORIF patella summer 2018 PSYCH: Normally interactive. Conversant. Not depressed or anxious appearing.  Calm demeanor.  She has easily reproducible chest wall tenderness on the right side No rash or skin lesion   Given meloxicam in 2013 and tolerated well per her report  Assessment and Plan: Hospital discharge follow-up - Plan: CBC  Right sided abdominal pain - Plan: meloxicam (MOBIC) 7.5 MG tablet, HYDROcodone-acetaminophen (NORCO/VICODIN) 5-325 MG tablet  DM (diabetes mellitus) with complications (HCC) - Plan: Hemoglobin A1c  Chest wall pain s/p extensive eval in the hospital which did not reveal any dangerous pathology  Gave mobic 7.5 to take daily. She will watch for any sign of allergic reaction   Gave a few hyrocodone to use as  needed- do not combine with benzos or flexeril She will alert me if not feeling better in a few days- in that case will try prednisone for her   Signed Abbe AmsterdamJessica Copland, MD  Received her las 11/15 Minimal anemia, colon done 2016 normal Message to pt  Results for orders placed or performed in visit on 02/05/18  CBC  Result Value Ref Range   WBC 11.9 (H) 4.0 -  10.5 K/uL   RBC 4.61 3.87 - 5.11 Mil/uL   Platelets 317.0 150.0 - 400.0 K/uL   Hemoglobin 11.8 (L) 12.0 - 15.0 g/dL   HCT 01.037.1 27.236.0 - 53.646.0 %   MCV 80.4 78.0 - 100.0 fl   MCHC 31.7 30.0 - 36.0 g/dL   RDW 64.414.9 03.411.5 - 74.215.5 %  Hemoglobin A1c  Result Value Ref Range   Hgb A1c MFr Bld 7.0 (H) 4.6 - 6.5 %   Message to pt

## 2018-02-04 ENCOUNTER — Ambulatory Visit: Payer: PPO | Admitting: Internal Medicine

## 2018-02-04 ENCOUNTER — Encounter: Payer: Self-pay | Admitting: Internal Medicine

## 2018-02-04 VITALS — BP 147/48 | HR 58 | Ht 61.5 in | Wt 216.6 lb

## 2018-02-04 DIAGNOSIS — F419 Anxiety disorder, unspecified: Secondary | ICD-10-CM | POA: Diagnosis not present

## 2018-02-04 DIAGNOSIS — I2510 Atherosclerotic heart disease of native coronary artery without angina pectoris: Secondary | ICD-10-CM

## 2018-02-04 DIAGNOSIS — Z9861 Coronary angioplasty status: Secondary | ICD-10-CM

## 2018-02-04 DIAGNOSIS — R0789 Other chest pain: Secondary | ICD-10-CM

## 2018-02-04 NOTE — Patient Instructions (Signed)
Medication Instructions:  Continue current medications If you need a refill on your cardiac medications before your next appointment, please call your pharmacy.   Follow-Up: At [facility], you and your health needs are our priority.  As part of our continuing mission to provide you with exceptional heart care, we have created designated Provider Care Teams.  These Care Teams include your primary Cardiologist (physician) and Advanced Practice Providers (APPs -  Physician Assistants and Nurse Practitioners) who all work together to provide you with the care you need, when you need it. You will need a follow up appointment in 6 months.  Please call our office 2 months in advance to schedule this appointment.  You may see Kenneth C Hilty, MD or one of the following Advanced Practice Providers on your designated Care Team: Hao Meng, PA-C . Angela Duke, PA-C  Any Other Special Instructions Will Be Listed Below (If Applicable).    

## 2018-02-04 NOTE — Progress Notes (Signed)
Date:  02/04/2018   ID:  [name], DOB 1951/02/25, MRN [id number]  PCP:  Pearline Cables, MD  Primary Cardiologist:  Rennis Golden  CC:  Follow-up chest pain   History of Present Illness: [name] is a 67 y.o. female with a history of CAD with DES stent to prox. LAD in 2010 with residual disease in LCX 10-20% and RCA 20% lesion.  She also has HTN, DM, dyslipidemia and obesity.  She presented on 10/01/12 to the ED with c/o chest pressure. Discomfort began at work and associated with flushing symptoms secondary to niacin. No DOE, SOB, nausea or diaphoresis. She then developed chest pressure and nausea and went to Urgent care. She thought the flushing was secondary to stopping ASA. She was given ASA, cooled down and then had chest presssure. Sent to [facility] ER and NTG SL did resolve the pressure. She was very concerned about the pressure because it was the same that she had with her Lexiscan in 2010 that lead to her stent.  Coronary angiogram revealed widely patent proximal LAD stent with otherwise normal coronary arteries and normal LV function.  [name] recently underwent back surgery and is doing fairly well. She still is having some back pain but is adamant about not taking medication. She plans to go back to work on Monday next week. Unfortunately she is still grieving from her brother who passed away last week. He had stage IV pancreatic cancer and lived less than one year.  [name] returns today for follow-up. Overall she denies any chest pain or worsening shortness of breath. Recently she's been having some palpitations, however she attributes that to stress. She recently is been placed on CPAP and seems to be tolerating that well. She's had some problems with memory loss but neurologic workup is been unremarkable. She recently had worsening back problems and has been to see Dr. Shelle Iron. He is considering surgery on her back but would need her to come off of her antiplatelets  medications.  At the pleasure seeing [name] back today in follow-up. She underwent successful back surgery and is doing better. She's managed to lose significant amount of weight. She's remained on Toprol which was started preoperatively. Her surgery was without complication. Recently she's had some dizziness with change in position. This could be related to dietary changes however I did note that her diastolic pressure is low today at 56. She denies any chest pain or worsening shortness of breath.  02/12/2016  [name] returns today for follow-up. She is recently been having some more palpitations. She reports being under significant stress as her daughter and granddaughter are now living with them. She gets some fullness in her chest when she has palpitations. These episodes occur 2-3 times a week over the past month or so. She denies any exertional chest pain or worsening shortness of breath. Weight is Morrie Sheldon down 3 pounds since her last office visit. Blood pressure is well-controlled. She recently started Celebrex and I spoke with her primary care provider about this. She needs to be careful about taking it regularly in the setting of aspirin and Plavix use.  03/07/2016  I saw [name] back today for follow-up of her monitor. She reports she had 2 episodes of palpitations and some elevated blood pressure on the day of Thanksgiving. She was dealing with her sister who had recently had a brain bleed and had to take her to the emergency room. She apparently had some seizures.  She was also trying to cook Thanksgiving meals and felt overwhelmed. She did take some Valium with some benefit however one point said her blood pressure was up to 210/100. Since then she's felt much better. We did not to capture any episodes of arrhythmias, extrasystoles or anything concerning on her monitor. I suspect her symptoms are related to stress and anxiety. Blood pressure is fairly well-controlled  today.  10/02/2016  [name] returns today for follow-up. Unfortunate she recently broke her leg. She says she has a problem with some foot drag and she misstepped. She had have surgery and is still wearing a brace in the right leg. She is improving and has better range of motion. Blood pressure is good today 138/62. She reports recently she's been having some dizziness. Sometimes positional with sitting up at other times not positional. She is afraid of falling. She's been very anxious. In fact she's been taking Valium although she says only a few times a week. She does not think that is related to either the Valium or her pain medicine. She is concerned it could be her blood pressure although has not noted any low blood pressure readings.  07/21/2017  [name] was seen today in follow-up.  She is without any cardiac complaints.  In September she was hospitalized with chest pain symptoms.  She ruled out for MI and underwent stress testing which was low risk.  Subsequently she was noted to have significant anxiety, some degree of agoraphobia, and fear of walking by herself.  She was started on fluoxetine and has been referred to a psychologist.  She is also undergone some cognitive behavioral therapy which has been helpful.  02/04/2018  [name] was seen today in follow-up of recent hospitalization for chest pain.  She was seen by my partner Dr. Sherlie Ban who felt that her pain was pleuritic, positional and reproducible on exam and not likely cardiac.  She ruled out for MI.  According to her husband recently she has been caring more things and doing more exertion and he feels that she may have strained her back.  She does describe pain and tenderness which was reproducible along the right mid thoracic area that seems to wrap around the anterior chest.  The symptoms are still persistent but somewhat better.  Wt Readings from Last 3 Encounters:  02/04/18 216 lb 9.6 oz (98.2 kg)  01/31/18 207 lb 14.3 oz (94.3 kg)   11/03/17 211 lb (95.7 kg)     Past Medical History:  Diagnosis Date  . Anemia   . Arthritis   . Bronchitis    hx of   . CAD (coronary artery disease)    a. s/p DES to LAD in 2010 with patent stent by cath in 2014 b. low-risk NST in 11/2016  . Cataracts, bilateral   . Diabetes mellitus without complication (HCC)   . Family history of adverse reaction to anesthesia    pts mother had difficulty awakening   . GERD (gastroesophageal reflux disease)   . Hyperlipidemia LDL goal < 70   . Hypertension   . Lumbar back pain   . Numbness    left leg and foot   . Plantar fascia rupture    Left Foot  . Sleep apnea    uses CPAP     Current Outpatient Medications  Medication Sig Dispense Refill  . acetaminophen (TYLENOL) 500 MG tablet Take 500 mg by mouth every 6 (six) hours as needed for mild pain or headache.    Marland Kitchen  aspirin EC 81 MG EC tablet Take 1 tablet (81 mg total) by mouth daily. 30 tablet 1  . calcium carbonate (OS-CAL) 1250 (500 Ca) MG chewable tablet Chew 1 tablet by mouth daily.    . cetirizine (ZYRTEC) 10 MG chewable tablet Chew 10 mg by mouth daily as needed for allergies.    . cholecalciferol (VITAMIN D) 1000 UNITS tablet Take 1,000 Units by mouth daily.    . clopidogrel (PLAVIX) 75 MG tablet TAKE 1 TABLET BY MOUTH DAILY. (Patient taking differently: Take 75 mg by mouth daily. ) 90 tablet 3  . diazepam (VALIUM) 2 MG tablet TAKE 1 TABLET BY MOUTH EVERY 12 hours as needed for anxiety (Patient taking differently: Take 2 mg by mouth every 12 (twelve) hours as needed for muscle spasms. ) 40 tablet 1  . diclofenac sodium (VOLTAREN) 1 % GEL Apply 2 g topically 4 (four) times daily.    . famotidine (PEPCID) 20 MG tablet TAKE 1 TABLET BY MOUTH 2 TIMES DAILY. (Patient taking differently: Take 20 mg by mouth 2 (two) times daily. ) 180 tablet 1  . FLUoxetine (PROZAC) 20 MG capsule Take 1 capsule (20 mg total) by mouth daily. 90 capsule 3  . fluticasone (FLONASE) 50 MCG/ACT nasal spray Place  1 spray into both nostrils daily as needed for allergies or rhinitis.    . metFORMIN (GLUCOPHAGE-XR) 500 MG 24 hr tablet TAKE 1 TABLET (500 MG TOTAL) BY MOUTH DAILY WITH BREAKFAST. 90 tablet 1  . metoprolol succinate (TOPROL-XL) 25 MG 24 hr tablet TAKE 1/2 TABLET BY MOUTH DAILY (Patient taking differently: Take 12.5 mg by mouth daily. ) 45 tablet 3  . Multiple Vitamin (MULTIVITAMIN WITH MINERALS) TABS tablet Take 1 tablet by mouth daily.    . nitroGLYCERIN (NITROSTAT) 0.4 MG SL tablet Place 1 tablet (0.4 mg total) under the tongue every 5 (five) minutes x 3 doses as needed for chest pain. 25 tablet 3  . ondansetron (ZOFRAN) 4 MG tablet Take 1 tablet (4 mg total) by mouth every 8 (eight) hours as needed. (Patient taking differently: Take 4 mg by mouth every 8 (eight) hours as needed for nausea or vomiting. ) 12 tablet 0  . simvastatin (ZOCOR) 40 MG tablet Take 1 tablet (40 mg total) by mouth every morning. 90 tablet 1  . telmisartan (MICARDIS) 40 MG tablet TAKE 1 TABLET BY MOUTH EVERY MORNING (Patient taking differently: Take 40 mg by mouth daily. ) 90 tablet 1   No current facility-administered medications for this visit.     Allergies:    Allergies  Allergen Reactions  . Niacin And Related Other (See Comments)    Whelps and skin flushed, mouth tingling  . Tolectin [Tolmetin] Rash    Social History:  The patient  reports that she has never smoked. She has never used smokeless tobacco. She reports that she drinks alcohol. She reports that she does not use drugs.   Family history:   Family History  Problem Relation Age of Onset  . Coronary artery disease Mother   . Rheum arthritis Mother   . Dementia Mother   . Heart attack Father   . Hypertension Father   . Hyperlipidemia Father   . Other Father        MVA  . Hypertension Brother   . Cancer Brother   . Heart disease Brother   . Cancer Paternal Grandmother        stomach  . Diabetes Paternal Grandfather   . Breast cancer Neg  Hx      ROS: Pertinent items noted in HPI and remainder of comprehensive ROS otherwise negative.   PHYSICAL EXAM: VS:  BP (!) 147/48   Pulse (!) 58   Ht 5' 1.5" (1.562 m)   Wt 216 lb 9.6 oz (98.2 kg)   LMP  (LMP Unknown)   SpO2 99%   BMI 40.26 kg/m  General appearance: alert, no distress and morbidly obese Neck: no carotid bruit, no JVD and thyroid not enlarged, symmetric, no tenderness/mass/nodules Lungs: clear to auscultation bilaterally Heart: regular rate and rhythm Abdomen: soft, non-tender; bowel sounds normal; no masses,  no organomegaly Extremities: Pain on palpation along the right thoracic spine and the back Pulses: 2+ and symmetric Skin: Skin color, texture, turgor normal. No rashes or lesions Neurologic: Grossly normal Psych: Mildly anxious  EKG: Deferred  ASSESSMENT: 1. Musculoskeletal chest pain 2. Chronic RBBB 3. Coronary artery disease status post DES the proximal LAD in 2010, low risk Myoview (11/2016)-LVEF 78% 4. Hypertension 5. Dyslipidemia 6. Obesity 7. Type 2 diabetes 8. Palpitations 9. OSA on CPAP 10. Anxiety  PLAN:  1.  [name] recently was hospitalized for chest pain and ruled out for MI.  It seems that she has musculoskeletal chest pain which is reproducible along the thoracic spine.  The pain wraps around her anterior chest and I think she benefit from either anti-inflammatories (steroids or nonsteroidals) plus or minus muscle relaxants, given the fact that she recently had been doing some heavier lifting.  She does have an appointment with her PCP tomorrow to address this and I will defer that treatment to her.  Follow-up with me in 6 months.  Chrystie NoseKenneth C. , MD, [place] ([facility] Va Medical CenterFACC, FACP  [place]  [facility]  Medical Director of the [facility]  [facility] Diplomate of the American Board of Clinical Lipidology Attending Cardiologist  Direct Dial: [phone]  Fax: [phone]  Website:   [email or web address]

## 2018-02-05 ENCOUNTER — Encounter: Payer: Self-pay | Admitting: Family Medicine

## 2018-02-05 ENCOUNTER — Ambulatory Visit: Payer: Self-pay | Admitting: Adult Health

## 2018-02-05 ENCOUNTER — Ambulatory Visit (INDEPENDENT_AMBULATORY_CARE_PROVIDER_SITE_OTHER): Payer: PPO | Admitting: Family Medicine

## 2018-02-05 VITALS — BP 132/58 | HR 61 | Temp 97.8°F | Resp 16 | Ht 61.5 in | Wt 216.0 lb

## 2018-02-05 DIAGNOSIS — Z09 Encounter for follow-up examination after completed treatment for conditions other than malignant neoplasm: Secondary | ICD-10-CM | POA: Diagnosis not present

## 2018-02-05 DIAGNOSIS — E118 Type 2 diabetes mellitus with unspecified complications: Secondary | ICD-10-CM | POA: Diagnosis not present

## 2018-02-05 DIAGNOSIS — R109 Unspecified abdominal pain: Secondary | ICD-10-CM

## 2018-02-05 MED ORDER — HYDROCODONE-ACETAMINOPHEN 5-325 MG PO TABS: 1.0000 | ORAL_TABLET | Freq: Three times a day (TID) | ORAL | 0 refills | Status: AC | PRN

## 2018-02-05 MED ORDER — MELOXICAM 7.5 MG PO TABS: 7.5000 mg | ORAL_TABLET | Freq: Every day | ORAL | 0 refills | Status: DC

## 2018-02-05 MED FILL — MELOXICAM 7.5 MG TABLET: 7.5 | 30 days supply | Qty: 30 | Fill #0

## 2018-02-05 MED FILL — HYDROCODON-APAP 5-325: 5-325 | 5 days supply | Qty: 15 | Fill #0

## 2018-02-05 NOTE — Patient Instructions (Signed)
Good to see you today!  I gave you a few hydrocodone to use if needed Also meloxicam 7.5 mg- use once a day as needed If you are not doing better in the next couple of days let me know and I'll rx prednisone for oyu

## 2018-02-06 ENCOUNTER — Encounter: Payer: Self-pay | Admitting: Family Medicine

## 2018-02-06 LAB — CBC
HEMATOCRIT: 37.1 % (ref 36.0–46.0)
Hemoglobin: 11.8 g/dL — ABNORMAL LOW (ref 12.0–15.0)
MCHC: 31.7 g/dL (ref 30.0–36.0)
MCV: 80.4 fl (ref 78.0–100.0)
PLATELETS: 317 10*3/uL (ref 150.0–400.0)
RBC: 4.61 Mil/uL (ref 3.87–5.11)
RDW: 14.9 % (ref 11.5–15.5)
WBC: 11.9 10*3/uL — ABNORMAL HIGH (ref 4.0–10.5)

## 2018-02-06 LAB — HEMOGLOBIN A1C: Hgb A1c MFr Bld: 7 % — ABNORMAL HIGH (ref 4.6–6.5)

## 2018-02-10 MED FILL — METOPROLOL SUCCINATE ER 25: 25 | 90 days supply | Qty: 45 | Fill #1

## 2018-02-26 DIAGNOSIS — G4733 Obstructive sleep apnea (adult) (pediatric): Secondary | ICD-10-CM | POA: Diagnosis not present

## 2018-02-26 DIAGNOSIS — Z7984 Long term (current) use of oral hypoglycemic drugs: Secondary | ICD-10-CM | POA: Diagnosis not present

## 2018-02-26 DIAGNOSIS — Z961 Presence of intraocular lens: Secondary | ICD-10-CM | POA: Diagnosis not present

## 2018-02-26 DIAGNOSIS — E119 Type 2 diabetes mellitus without complications: Secondary | ICD-10-CM | POA: Diagnosis not present

## 2018-02-26 DIAGNOSIS — H5201 Hypermetropia, right eye: Secondary | ICD-10-CM | POA: Diagnosis not present

## 2018-02-26 DIAGNOSIS — H53022 Refractive amblyopia, left eye: Secondary | ICD-10-CM | POA: Diagnosis not present

## 2018-02-26 DIAGNOSIS — H52203 Unspecified astigmatism, bilateral: Secondary | ICD-10-CM | POA: Diagnosis not present

## 2018-02-26 DIAGNOSIS — H524 Presbyopia: Secondary | ICD-10-CM | POA: Diagnosis not present

## 2018-03-09 ENCOUNTER — Other Ambulatory Visit: Payer: Self-pay | Admitting: Internal Medicine

## 2018-03-09 ENCOUNTER — Other Ambulatory Visit: Payer: Self-pay | Admitting: Family Medicine

## 2018-03-09 DIAGNOSIS — E119 Type 2 diabetes mellitus without complications: Secondary | ICD-10-CM

## 2018-03-09 MED FILL — metFORMIN HCL ER 500 MG TB2: 500 | 90 days supply | Qty: 90 | Fill #0

## 2018-03-11 MED FILL — CLOPIDOGREL 75 MG TABLET: 75 | 90 days supply | Qty: 90 | Fill #0

## 2018-03-29 DIAGNOSIS — G4733 Obstructive sleep apnea (adult) (pediatric): Secondary | ICD-10-CM | POA: Diagnosis not present

## 2018-04-15 ENCOUNTER — Other Ambulatory Visit: Payer: Self-pay | Admitting: Family Medicine

## 2018-04-15 DIAGNOSIS — E1169 Type 2 diabetes mellitus with other specified complication: Secondary | ICD-10-CM

## 2018-04-15 DIAGNOSIS — Z955 Presence of coronary angioplasty implant and graft: Secondary | ICD-10-CM

## 2018-04-15 DIAGNOSIS — E785 Hyperlipidemia, unspecified: Principal | ICD-10-CM

## 2018-04-15 MED FILL — SIMVASTATIN 40 MG TABLET: 40 | 90 days supply | Qty: 90 | Fill #0

## 2018-04-15 MED FILL — FAMOTIDINE 20 MG TABLET: 20 | 90 days supply | Qty: 180 | Fill #1

## 2018-04-27 MED FILL — FLUoxetine HCL 20 MG CAPS: 20 | 90 days supply | Qty: 90 | Fill #2

## 2018-04-27 MED FILL — TELMISARTAN 40 MG TABS: 40 | 90 days supply | Qty: 90 | Fill #1

## 2018-04-28 DIAGNOSIS — G4733 Obstructive sleep apnea (adult) (pediatric): Secondary | ICD-10-CM | POA: Diagnosis not present

## 2018-05-05 ENCOUNTER — Encounter: Payer: Self-pay | Admitting: Nurse Practitioner

## 2018-05-06 NOTE — Progress Notes (Signed)
[facility]  PATIENT: [name] Lung DOB: 08/15/1950   REASON FOR VISIT: Follow-up for obstructive sleep apnea with initial CPAP HISTORY FROM: Patient    HISTORY OF PRESENT ILLNESS:UPDATE 2/13/2020CM [name], 68 year old female returns for follow-up with a history of obstructive sleep apnea here for initial CPAP compliance.  She continues to complain with fatigue with the machine.  She is noted to have a leak.  Compliance data dated 04/06/2018-to 05/05/2018 shows compliance greater than 4 hours at 93%.  Average usage 7 hours 16 minutes.  Set pressure 6 to 10 cm.  EPR level 1 leak 95th percentile 24.7 max 43.5.  AHI 1.6 ESS 12.  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 Joycelyn Schmid for sleep study the date of the study was 13 February 2014 she had a endorsed coronary artery disease, obesity, asthma, anemia, diabetes mellitus with nocturia, neuropathy, hypertension, GERD, hyperlipidemia and she has been followed for memory loss has had extensive work-up with Dr. Rolly Pancake- Vilma Prader .  Her husband had witnessed loud snoring and she also struggled with insomnia and frequent awakenings at night.  At the time she had a BMI of 42.1 Epworth Sleepiness Scale was endorsed at 10 point.  Patient was diagnosed with mild apnea at an AHI of 11.2, which exacerbated during REM sleep to 44.7, there was minor accentuation during supine sleep versus nonsupine sleep.  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   She was placed on CPAP and reports that she used the machine compliantly- advised that she had to use it 4 hours each night, but then received a phone call from the DME stating that she was no longer in compliance and she had to return her CPAP machine  this was about 2-1/2 years ago. Her husband interjjected that had not used it  Due to nocturia- "the up and down and being tethered".   Sleep habits are as follows: Dinner time is usually 6 PM, and she may use her stationary bike some afternoons. Her bed time is 10 PM or ater, not later than midnight. Her bedroom is cool, quiet and dark- uses a fan. She is usually asleep by 12.00 but she sleeps in the same bed with a her great- granddaughter ( 4) , and is frequently woken.  She rises at 3.30 and 6.30 for nocturia- every 3 hours . She will sleep again until 10 AM.  She naps by 2 pm- her sleep habits have deteriorated. She snores and she still has apnea.    REVIEW OF SYSTEMS: Full 14 system review of systems performed and notable only for those listed, all others are neg:  Constitutional: neg  Cardiovascular: neg Ear/Nose/Throat: neg  Skin: neg Eyes: neg Respiratory: neg Gastroitestinal: neg  Hematology/Lymphatic: neg  Endocrine: neg Musculoskeletal: Chronic back pain joint pain Allergy/Immunology: neg Neurological: Occasional headache Psychiatric: neg Sleep : Obstructive sleep apnea with CPAP   ALLERGIES: Allergies  Allergen Reactions  . Niacin And Related Other (See Comments)    Whelps and skin flushed, mouth tingling  . Tolectin [Tolmetin] Rash    HOME MEDICATIONS: Outpatient Medications Prior to Visit  Medication Sig Dispense Refill  . acetaminophen (TYLENOL) 500 MG tablet Take 500 mg by mouth every 6 (six) hours as  needed for mild pain or headache.    Marland Kitchen aspirin EC 81 MG EC tablet Take 1 tablet (81 mg total) by mouth daily. 30 tablet 1  . calcium carbonate (OS-CAL) 1250 (500 Ca) MG chewable tablet Chew 1 tablet by mouth daily.    . cetirizine (ZYRTEC) 10 MG chewable tablet Chew 10 mg by mouth daily as needed for allergies.    . cholecalciferol (VITAMIN D) 1000 UNITS tablet Take 1,000 Units by mouth daily.    . clopidogrel (PLAVIX) 75 MG tablet TAKE 1 TABLET BY MOUTH  DAILY. 90 tablet 3  . diazepam (VALIUM) 2 MG tablet TAKE 1 TABLET BY MOUTH EVERY 12 hours as needed for anxiety (Patient taking differently: Take 2 mg by mouth every 12 (twelve) hours as needed for muscle spasms. ) 40 tablet 1  . diclofenac sodium (VOLTAREN) 1 % GEL Apply 2 g topically 4 (four) times daily.    . famotidine (PEPCID) 20 MG tablet TAKE 1 TABLET BY MOUTH 2 TIMES DAILY. (Patient taking differently: Take 20 mg by mouth 2 (two) times daily. ) 180 tablet 1  . FLUoxetine (PROZAC) 20 MG capsule Take 1 capsule (20 mg total) by mouth daily. 90 capsule 3  . fluticasone (FLONASE) 50 MCG/ACT nasal spray Place 1 spray into both nostrils daily as needed for allergies or rhinitis.    . meloxicam (MOBIC) 7.5 MG tablet Take 1 tablet (7.5 mg total) by mouth daily. Use as needed for side pain 30 tablet 0  . metFORMIN (GLUCOPHAGE-XR) 500 MG 24 hr tablet TAKE 1 TABLET (500 MG TOTAL) BY MOUTH DAILY WITH BREAKFAST. 90 tablet 1  . metoprolol succinate (TOPROL-XL) 25 MG 24 hr tablet TAKE 1/2 TABLET BY MOUTH DAILY (Patient taking differently: Take 12.5 mg by mouth daily. ) 45 tablet 3  . Multiple Vitamin (MULTIVITAMIN WITH MINERALS) TABS tablet Take 1 tablet by mouth daily.    . nitroGLYCERIN (NITROSTAT) 0.4 MG SL tablet Place 1 tablet (0.4 mg total) under the tongue every 5 (five) minutes x 3 doses as needed for chest pain. 25 tablet 3  . ondansetron (ZOFRAN) 4 MG tablet Take 1 tablet (4 mg total) by mouth every 8 (eight) hours as needed. (Patient taking differently: Take 4 mg by mouth every 8 (eight) hours as needed for nausea or vomiting. ) 12 tablet 0  . simvastatin (ZOCOR) 40 MG tablet TAKE 1 TABLET BY MOUTH EVERY DAY 90 tablet 1  . telmisartan (MICARDIS) 40 MG tablet TAKE 1 TABLET BY MOUTH EVERY MORNING (Patient taking differently: Take 40 mg by mouth daily. ) 90 tablet 1   No facility-administered medications prior to visit.     PAST MEDICAL HISTORY: Past Medical History:  Diagnosis Date  . Anemia     . Arthritis   . Bronchitis    hx of   . CAD (coronary artery disease)    a. s/p DES to LAD in 2010 with patent stent by cath in 2014 b. low-risk NST in 11/2016  . Cataracts, bilateral   . Diabetes mellitus without complication (HCC)   . Family history of adverse reaction to anesthesia    pts mother had difficulty awakening   . GERD (gastroesophageal reflux disease)   . Hyperlipidemia LDL goal < 70   . Hypertension   . Lumbar back pain   . Numbness    left leg and foot   . Plantar fascia rupture    Left Foot  . Sleep apnea    uses CPAP  PAST SURGICAL HISTORY: Past Surgical History:  Procedure Laterality Date  . ABDOMINAL HYSTERECTOMY    . BACK SURGERY    . BREAST CYST ASPIRATION  1995  . CAROTID STENT  2009   pt denies   . CORONARY ANGIOPLASTY WITH STENT PLACEMENT  2010and 10-02-2012   Stent DES, Xience to prox. LAD  . DOPPLER ECHOCARDIOGRAPHY  08/01/2009   EF=>55%,LV normal  . LEFT HEART CATHETERIZATION WITH CORONARY ANGIOGRAM N/A 10/02/2012   Procedure: LEFT HEART CATHETERIZATION WITH CORONARY ANGIOGRAM;  Surgeon: Runell GessJonathan J Berry, MD;  [facility];  Service: Cardiovascular;  Laterality: N/A;  . lower arterial duplex  06/20/10   abi's normal,rgt 0.98,lft 1.06;bilateral PVRs normal  . LUMBAR LAMINECTOMY/DECOMPRESSION MICRODISCECTOMY Left 01/06/2013   Procedure: MICRO LUMBAR DECOMPRESSION L4-5 AND L5-S1;  Surgeon: Javier DockerJeffrey C Beane, MD;  Location: [facility];  Service: Orthopedics;  Laterality: Left;  . LUMBAR LAMINECTOMY/DECOMPRESSION MICRODISCECTOMY Left 10/12/2014   Procedure: REVISION MICRO LUMBAR/DECOMPRESSION L4-5 LEFT ;  Surgeon: Jene EveryJeffrey Beane, MD;  Location: [facility];  Service: Orthopedics;  Laterality: Left;  . NM MYOCAR PERF WALL MOTION  09/22/2008   lexiscan-EF 83%; glogal LV systolic fx is norm. ,evidence of mild ischemia basal anterior,midanterior and apical lateral region(s).   . ORIF PATELLA Right 08/29/2016   Procedure: OPEN REDUCTION INTERNAL (ORIF)  FIXATION RIGHT PATELLA;  Surgeon: Samson FredericSwinteck, Brian, MD;  Location: [facility];  Service: Orthopedics;  Laterality: Right;  Adductor Block  . TUBAL LIGATION    . TYMPANOPLASTY Bilateral   . UVULOPALATOPHARYNGOPLASTY     pt denies     FAMILY HISTORY: Family History  Problem Relation Age of Onset  . Coronary artery disease Mother   . Rheum arthritis Mother   . Dementia Mother   . Heart attack Father   . Hypertension Father   . Hyperlipidemia Father   . Other Father        MVA  . Hypertension Brother   . Cancer Brother   . Heart disease Brother   . Cancer Paternal Grandmother        stomach  . Diabetes Paternal Grandfather   . Breast cancer Neg Hx     SOCIAL HISTORY: Social History   Socioeconomic History  . Marital status: Married    Spouse name: [name]  . Number of children: 2  . Years of education: MSN  . Highest education level: Not on file  Occupational History  . Occupation: Chemical engineerDIRECTOR    Employer: [employer]  Social Needs  . Financial resource strain: Not on file  . Food insecurity:    Worry: Not on file    Inability: Not on file  . Transportation needs:    Medical: Not on file    Non-medical: Not on file  Tobacco Use  . Smoking status: Never Smoker  . Smokeless tobacco: Never Used  Substance and Sexual Activity  . Alcohol use: Yes    Comment: occasional, 1 a month wine  . Drug use: No  . Sexual activity: Yes  Lifestyle  . Physical activity:    Days per week: Not on file    Minutes per session: Not on file  . Stress: Not on file  Relationships  . Social connections:    Talks on phone: Not on file    Gets together: Not on file    Attends religious service: Not on file    Active member of club or organization: Not on file    Attends meetings of clubs or organizations: Not on  file    Relationship status: Not on file  . Intimate partner violence:    Fear of current or ex partner: Not on file    Emotionally abused: Not on file    Physically abused:  Not on file    Forced sexual activity: Not on file  Other Topics Concern  . Not on file  Social History Narrative      Married to [place]. Education: [employer]. Exercise: Yes   Patient lives at home with her spouse. retired   Caffeine use: 1 drink of tea a day     PHYSICAL EXAM  Vitals:   05/07/18 1238  BP: (!) 155/65  Pulse: (!) 54  Weight: 219 lb 6.4 oz (99.5 kg)  Height: 5' 1.5" (1.562 m)   Body mass index is 40.78 kg/m.  Generalized: Well developed, morbidly obese female in no acute distress  Head: normocephalic and atraumatic,. Oropharynx benign mallopatti 4-5 Neck: Supple, neck circumference 16 Musculoskeletal: No deformity  Skin no rash or edema Neurological examination   Mentation: Alert oriented to time, place, history taking. Attention span and concentration appropriate. Recent and remote memory intact.  Follows all commands speech and language fluent.   Cranial nerve II-XII: Pupils were equal round reactive to light extraocular movements were full, visual field were full on confrontational test. Facial sensation and strength were normal. hearing was intact to finger rubbing bilaterally. Uvula tongue midline. head turning and shoulder shrug were normal and symmetric.Tongue protrusion into cheek strength was normal. Motor: normal bulk and tone, full strength in the BUE, BLE,  Sensory: normal and symmetric to light touch,  Coordination: finger-nose-finger, no dysmetria Gait and Station: Rising up from seated position without assistance, wide-based stance ambulates without assistive device  DIAGNOSTIC DATA (LABS, IMAGING, TESTING) - I reviewed patient records, labs, notes, testing and imaging myself where available.  Lab Results  Component Value Date   WBC 11.9 (H) 02/05/2018   HGB 11.8 (L) 02/05/2018   HCT 37.1 02/05/2018   MCV 80.4 02/05/2018   PLT 317.0 02/05/2018      Component Value Date/Time   NA 136 01/31/2018 1345   K 3.7 01/31/2018 1345   CL 102  01/31/2018 1345   CO2 25 01/31/2018 1345   GLUCOSE 132 (H) 01/31/2018 1345   BUN 12 01/31/2018 1345   CREATININE 0.74 01/31/2018 1846   CREATININE 0.82 09/29/2014 0910   CALCIUM 10.4 (H) 01/31/2018 1345   PROT 7.0 01/31/2018 1345   ALBUMIN 4.4 01/31/2018 1345   AST 26 01/31/2018 1345   ALT 31 01/31/2018 1345   ALKPHOS 96 01/31/2018 1345   BILITOT 0.7 01/31/2018 1345   GFRNONAA >60 01/31/2018 1846   GFRAA >60 01/31/2018 1846   Lab Results  Component Value Date   CHOL 163 10/23/2017   HDL 29.30 (L) 10/23/2017   LDLCALC 69 12/12/2016   LDLDIRECT 52.0 10/23/2017   TRIG (H) 10/23/2017    421.0 Triglyceride is over 400; calculations on Lipids are invalid.   CHOLHDL 6 10/23/2017   Lab Results  Component Value Date   HGBA1C 7.0 (H) 02/05/2018   Lab Results  Component Value Date   VITAMINB12 924 12/20/2013   Lab Results  Component Value Date   TSH 3.11 01/25/2016      ASSESSMENT AND PLAN  68 y.o. year old female  has a past medical history of Anemia, Arthritis,  CAD (coronary artery disease),  Diabetes mellitus without complication (HCC),  GERD (gastroesophageal reflux disease), Hyperlipidemia LDL goal < 70, Hypertension,  Lumbar back pain, Numbness, Plantar fascia rupture, and Sleep apnea. here to follow-up for her sleep apnea with compliance.Data dated 04/06/2018-to 05/05/2018 shows compliance greater than 4 hours at 93%.  Average usage 7 hours 16 minutes.  Set pressure 6 to 10 cm.  EPR level 1 leak 95th percentile 24.7 max 43.5.  AHI 1.6 ESS 12.   PLAN: CPAP compliance 93% reviewed data with patient Has a leak needs mask refit RX sent  Continue same settings Follow-up in 4 months Nilda Riggs, [facility], [facility], APRN  [facility] [address] [place], [place] [address] [phone]

## 2018-05-07 ENCOUNTER — Encounter: Payer: Self-pay | Admitting: Nurse Practitioner

## 2018-05-07 ENCOUNTER — Other Ambulatory Visit: Payer: Self-pay | Admitting: Family Medicine

## 2018-05-07 ENCOUNTER — Ambulatory Visit: Payer: PPO | Admitting: Nurse Practitioner

## 2018-05-07 VITALS — BP 155/65 | HR 54 | Ht 61.5 in | Wt 219.4 lb

## 2018-05-07 DIAGNOSIS — Z9989 Dependence on other enabling machines and devices: Secondary | ICD-10-CM

## 2018-05-07 DIAGNOSIS — G4733 Obstructive sleep apnea (adult) (pediatric): Secondary | ICD-10-CM | POA: Diagnosis not present

## 2018-05-07 MED FILL — METOPROLOL SUCCINATE ER 25: 25 | 90 days supply | Qty: 45 | Fill #2

## 2018-05-07 NOTE — Patient Instructions (Addendum)
CPAP compliance 93% Has a leak needs mask refit Continue same settings Follow-up in 4 months

## 2018-05-07 NOTE — Progress Notes (Signed)
[facility] sent CM for new cpap orders.  [name]. Did receive today.

## 2018-05-08 DIAGNOSIS — G4733 Obstructive sleep apnea (adult) (pediatric): Secondary | ICD-10-CM | POA: Diagnosis not present

## 2018-05-25 MED FILL — CYCLOBENZAPRINE HCL 10 MG T: 10 | 15 days supply | Qty: 30 | Fill #0

## 2018-06-06 DIAGNOSIS — G4733 Obstructive sleep apnea (adult) (pediatric): Secondary | ICD-10-CM | POA: Diagnosis not present

## 2018-06-15 MED FILL — metFORMIN HCL ER 500 MG TB2: 500 | 90 days supply | Qty: 90 | Fill #1

## 2018-06-15 MED FILL — CLOPIDOGREL 75 MG TABLET: 75 | 90 days supply | Qty: 90 | Fill #1

## 2018-06-29 ENCOUNTER — Telehealth: Payer: Self-pay

## 2018-06-29 NOTE — Telephone Encounter (Signed)
   Cardiac Questionnaire:    Since your last visit or hospitalization:    1. Have you been having new or worsening chest pain?NO   2. Have you been having new or worsening shortness of breath? NO 3. Have you been having new or worsening leg swelling, wt gain, or increase in abdominal girth (pants fitting more tightly)? NO   4. Have you had any passing out spells? NO  Patient states she would rather be seen in office instead of the virtual visit and that she can reschedule her appointment.

## 2018-07-07 ENCOUNTER — Telehealth: Payer: Self-pay | Admitting: Physician Assistant

## 2018-07-07 ENCOUNTER — Ambulatory Visit: Payer: PPO | Admitting: Internal Medicine

## 2018-07-07 DIAGNOSIS — G4733 Obstructive sleep apnea (adult) (pediatric): Secondary | ICD-10-CM | POA: Diagnosis not present

## 2018-07-07 NOTE — Telephone Encounter (Signed)
   TELEPHONE CALL NOTE - RESCHEDULING [facility] CANCELLATIONS DUE TO COVID-19  Primary Cardiologist:Kenneth C Hilty, MD  Due to the recent COVID-19 pandemic, we are transitioning in-person office visits to tele-medicine visits in an effort to decrease non-essential exposure to our patients, their families, and our staff. This patient has been deemed a candidate to change their previously scheduled office visit to virtual visit.   Please schedule this patient for a telehealth visit with Dr. Rennis Golden or an APP on the team in the next 2-4 weeks.  I have sent instructions for a telehealth visit to the patient's MyChart (dotphrase [email or web address]).   Please call patient:  1. Inform them we will be conducting visit via tele-health, instead of coming to the office physically in person.  2. Please discuss doing telephone versus video visit. If the patient has a smartphone, video visit is preferred method. Please let the patient know what platform the provider will be using (WebEx, doximity, etc).   3. When contact is confirmed, please change appointment type to Virtual Office Visit and indicate appointment type in notes.  4. Please review consent at time of this phone call when contact is established or 2-3 days prior to visit (dotphrase is [email or web address]).   This phone note will be forwarded to the appropriate scheduling pool and the primary cardiologist's assist. I will remove this patient from the C19 cancellation pool.    [name] Rutherford [name], PA 07/07/2018, 3:14 PM

## 2018-07-13 MED FILL — DICLOFENAC SODIUM 1 % GEL: 1 | 17 days supply | Qty: 100 | Fill #2

## 2018-07-15 DIAGNOSIS — G4733 Obstructive sleep apnea (adult) (pediatric): Secondary | ICD-10-CM | POA: Diagnosis not present

## 2018-07-18 MED FILL — SIMVASTATIN 40 MG TABLET: 40 | 90 days supply | Qty: 90 | Fill #1

## 2018-07-21 ENCOUNTER — Telehealth: Payer: Self-pay | Admitting: Internal Medicine

## 2018-07-23 ENCOUNTER — Telehealth (INDEPENDENT_AMBULATORY_CARE_PROVIDER_SITE_OTHER): Payer: PPO | Admitting: Internal Medicine

## 2018-07-23 ENCOUNTER — Encounter: Payer: Self-pay | Admitting: Internal Medicine

## 2018-07-23 VITALS — BP 130/64 | HR 67 | Temp 97.8°F | Ht 61.0 in | Wt 217.0 lb

## 2018-07-23 DIAGNOSIS — G4733 Obstructive sleep apnea (adult) (pediatric): Secondary | ICD-10-CM | POA: Diagnosis not present

## 2018-07-23 DIAGNOSIS — I2583 Coronary atherosclerosis due to lipid rich plaque: Secondary | ICD-10-CM

## 2018-07-23 DIAGNOSIS — E118 Type 2 diabetes mellitus with unspecified complications: Secondary | ICD-10-CM

## 2018-07-23 DIAGNOSIS — I2510 Atherosclerotic heart disease of native coronary artery without angina pectoris: Secondary | ICD-10-CM | POA: Diagnosis not present

## 2018-07-23 DIAGNOSIS — Z9861 Coronary angioplasty status: Secondary | ICD-10-CM | POA: Diagnosis not present

## 2018-07-23 DIAGNOSIS — E785 Hyperlipidemia, unspecified: Secondary | ICD-10-CM

## 2018-07-23 DIAGNOSIS — I10 Essential (primary) hypertension: Secondary | ICD-10-CM

## 2018-07-23 DIAGNOSIS — Z9989 Dependence on other enabling machines and devices: Secondary | ICD-10-CM

## 2018-07-23 NOTE — Progress Notes (Signed)
Virtual Visit via Telephone Note   This visit type was conducted due to national recommendations for restrictions regarding the COVID-19 Pandemic (e.g. social distancing) in an effort to limit this patient's exposure and mitigate transmission in our community.  Due to her co-morbid illnesses, this patient is at least at moderate risk for complications without adequate follow up.  This format is felt to be most appropriate for this patient at this time.  The patient did not have access to video technology/had technical difficulties with video requiring transitioning to audio format only (telephone).  All issues noted in this document were discussed and addressed.  No physical exam could be performed with this format.  Please refer to the patient's chart for her  consent to telehealth for [facility].   Evaluation Performed: Telephone visit  Date:  07/23/2018   ID:  [name], Simpson Oct 24, 1950, MRN [id number]  Patient Location:  [address] [place] [address]  Provider location:   [address] [place], [place] [address]  PCP:  Pearline Cables, MD  Cardiologist:  Chrystie Nose, MD Electrophysiologist:  None   Chief Complaint: No complaint  History of Present Illness:    [name] is a 68 y.o. female who presents via audio/video conferencing for a telehealth visit today.  [name] was seen today in telephone visit for follow-up.  I last saw her in November 2019.  At that time she was recently hospitalized for chest pain but ruled out for MI it was felt to be musculoskeletal.  Her symptoms did improve with anti-inflammatory/muscle relaxants.  Since then she has had several other appointments including seeing her ophthalmologist, primary care provider and her sleep specialist.  She has had some difficulty with mask leaks and is going to change equipment.  She denies any recurrent chest pain.  She will be due for lab work this summer but has a follow-up with Dr.  Patsy Lager.  She is trying to do more activity and work outside.  She has lost a few pounds.  The patient does not have symptoms concerning for COVID-19 infection (fever, chills, cough, or new SHORTNESS OF BREATH).    Prior CV studies:   The following studies were reviewed today:  Chart reviewed  PMHx:  Past Medical History:  Diagnosis Date  . Anemia   . Arthritis   . Bronchitis    hx of   . CAD (coronary artery disease)    a. s/p DES to LAD in 2010 with patent stent by cath in 2014 b. low-risk NST in 11/2016  . Cataracts, bilateral   . Diabetes mellitus without complication (HCC)   . Family history of adverse reaction to anesthesia    pts mother had difficulty awakening   . GERD (gastroesophageal reflux disease)   . Hyperlipidemia LDL goal < 70   . Hypertension   . Lumbar back pain   . Numbness    left leg and foot   . Plantar fascia rupture    Left Foot  . Sleep apnea    uses CPAP     Past Surgical History:  Procedure Laterality Date  . ABDOMINAL HYSTERECTOMY    . BACK SURGERY    . BREAST CYST ASPIRATION  1995  . CAROTID STENT  2009   pt denies   . CORONARY ANGIOPLASTY WITH STENT PLACEMENT  2010and 10-02-2012   Stent DES, Xience to prox. LAD  . DOPPLER ECHOCARDIOGRAPHY  08/01/2009   EF=>55%,LV normal  . LEFT  HEART CATHETERIZATION WITH CORONARY ANGIOGRAM N/A 10/02/2012   Procedure: LEFT HEART CATHETERIZATION WITH CORONARY ANGIOGRAM;  Surgeon: Runell Gess, MD;  [facility] CATH LAB;  Service: Cardiovascular;  Laterality: N/A;  . lower arterial duplex  06/20/10   abi's normal,rgt 0.98,lft 1.06;bilateral PVRs normal  . LUMBAR LAMINECTOMY/DECOMPRESSION MICRODISCECTOMY Left 01/06/2013   Procedure: MICRO LUMBAR DECOMPRESSION L4-5 AND L5-S1;  Surgeon: Javier Docker, MD;  Location: [facility] ORS;  Service: Orthopedics;  Laterality: Left;  . LUMBAR LAMINECTOMY/DECOMPRESSION MICRODISCECTOMY Left 10/12/2014   Procedure: REVISION MICRO LUMBAR/DECOMPRESSION L4-5 LEFT ;  Surgeon:  Jene Every, MD;  Location: [facility] ORS;  Service: Orthopedics;  Laterality: Left;  . NM MYOCAR PERF WALL MOTION  09/22/2008   lexiscan-EF 83%; glogal LV systolic fx is norm. ,evidence of mild ischemia basal anterior,midanterior and apical lateral region(s).   . ORIF PATELLA Right 08/29/2016   Procedure: OPEN REDUCTION INTERNAL (ORIF) FIXATION RIGHT PATELLA;  Surgeon: Samson Frederic, MD;  Location: [facility] OR[facility];  Service: Orthopedics;  Laterality: Right;  Adductor Block  . TUBAL LIGATION    . TYMPANOPLASTY Bilateral   . UVULOPALATOPHARYNGOPLASTY     pt denies     FAMHx:  Family History  Problem Relation Age of Onset  . Coronary artery disease Mother   . Rheum arthritis Mother   . Dementia Mother   . Heart attack Father   . Hypertension Father   . Hyperlipidemia Father   . Other Father        MVA  . Hypertension Brother   . Cancer Brother   . Heart disease Brother   . Cancer Paternal Grandmother        stomach  . Diabetes Paternal Grandfather   . Breast cancer Neg Hx     SOCHx:   reports that she has never smoked. She has never used smokeless tobacco. She reports current alcohol use. She reports that she does not use drugs.  ALLERGIES:  Allergies  Allergen Reactions  . Niacin And Related Other (See Comments)    [place] and skin flushed, mouth tingling  . Tolectin [Tolmetin] Rash    MEDS:  Current Meds  Medication Sig  . acetaminophen (TYLENOL) 500 MG tablet Take 500 mg by mouth every 6 (six) hours as needed for mild pain or headache.  Marland Kitchen aspirin EC 81 MG EC tablet Take 1 tablet (81 mg total) by mouth daily.  . calcium carbonate (OS-CAL) 1250 (500 Ca) MG chewable tablet Chew 1 tablet by mouth daily.  . cetirizine (ZYRTEC) 10 MG chewable tablet Chew 10 mg by mouth daily as needed for allergies.  . cholecalciferol (VITAMIN D) 1000 UNITS tablet Take 1,000 Units by mouth daily.  . clopidogrel (PLAVIX) 75 MG tablet TAKE 1 TABLET BY MOUTH DAILY.  . cyclobenzaprine (FLEXERIL) 10 MG  tablet TAKE 1 TABLET BY MOUTH TWICE DAILY AS NEEDED FOR MUSCLE SPASMS  . diazepam (VALIUM) 2 MG tablet TAKE 1 TABLET BY MOUTH EVERY 12 hours as needed for anxiety (Patient taking differently: Take 2 mg by mouth every 12 (twelve) hours as needed for muscle spasms. )  . diclofenac sodium (VOLTAREN) 1 % GEL Apply 2 g topically 4 (four) times daily.  Marland Kitchen FLUoxetine (PROZAC) 20 MG capsule Take 1 capsule (20 mg total) by mouth daily.  . fluticasone (FLONASE) 50 MCG/ACT nasal spray Place 1 spray into both nostrils daily as needed for allergies or rhinitis.  . metFORMIN (GLUCOPHAGE-XR) 500 MG 24 hr tablet TAKE 1 TABLET (500 MG TOTAL) BY MOUTH DAILY WITH BREAKFAST.  Marland Kitchen  metoprolol succinate (TOPROL-XL) 25 MG 24 hr tablet TAKE 1/2 TABLET BY MOUTH DAILY (Patient taking differently: Take 12.5 mg by mouth daily. )  . Multiple Vitamin (MULTIVITAMIN WITH MINERALS) TABS tablet Take 1 tablet by mouth daily.  . nitroGLYCERIN (NITROSTAT) 0.4 MG SL tablet Place 1 tablet (0.4 mg total) under the tongue every 5 (five) minutes x 3 doses as needed for chest pain.  Marland Kitchen. ondansetron (ZOFRAN) 4 MG tablet Take 1 tablet (4 mg total) by mouth every 8 (eight) hours as needed. (Patient taking differently: Take 4 mg by mouth every 8 (eight) hours as needed for nausea or vomiting. )  . simvastatin (ZOCOR) 40 MG tablet TAKE 1 TABLET BY MOUTH EVERY DAY  . telmisartan (MICARDIS) 40 MG tablet TAKE 1 TABLET BY MOUTH EVERY MORNING (Patient taking differently: Take 40 mg by mouth daily. )     ROS: Pertinent items noted in HPI and remainder of comprehensive ROS otherwise negative.  Labs/Other Tests and Data Reviewed:    Recent Labs: 01/31/2018: ALT 31; BUN 12; Creatinine, Ser 0.74; Potassium 3.7; Sodium 136 02/05/2018: Hemoglobin 11.8; Platelets 317.0   Recent Lipid Panel Lab Results  Component Value Date/Time   CHOL 163 10/23/2017 10:03 AM   TRIG (H) 10/23/2017 10:03 AM    421.0 Triglyceride is over 400; calculations on Lipids are  invalid.   HDL 29.30 (L) 10/23/2017 10:03 AM   CHOLHDL 6 10/23/2017 10:03 AM   LDLCALC 69 12/12/2016 03:24 AM   LDLDIRECT 52.0 10/23/2017 10:03 AM    Wt Readings from Last 3 Encounters:  07/23/18 217 lb (98.4 kg)  05/07/18 219 lb 6.4 oz (99.5 kg)  02/05/18 216 lb (98 kg)     Exam:    Vital Signs:  BP 130/64   Pulse 67   Temp 97.8 F (36.6 C)   Ht 5\' 1"  (1.549 m)   Wt 217 lb (98.4 kg)   LMP  (LMP Unknown)   BMI 41.00 kg/m    Exam not performed due to telephone visit  ASSESSMENT & PLAN:    1. Chronic RBBB 2. Coronary artery disease status post DES the proximal LAD in 2010, low risk Myoview (11/2016)-LVEF 78% 3. Hypertension 4. Dyslipidemia 5. Obesity 6. Type 2 diabetes 7. Palpitations 8. OSA on CPAP 9. Anxiety  Ms. Gaillard seems to be doing well from a cardiac standpoint.  She denies any new chest pain or worsening shortness of breath.  Her blood pressures well controlled today.  Weight is down a few pounds however she is still considered morbidly obese with BMI greater than 40.  She has type 2 diabetes with hemoglobin A1c of 7 indicating reasonable control.  Her cholesterol will be due for recheck this summer and is followed by her PCP.  She denies any recurrent palpitations.  She recently saw her sleep specialist who will be fitting her with a new mask due to some leaks.  Otherwise she is doing well.  COVID-19 Education: The signs and symptoms of COVID-19 were discussed with the patient and how to seek care for testing (follow up with PCP or arrange E-visit).  The importance of social distancing was discussed today.  Patient Risk:   After full review of this patients clinical status, I feel that they are at least moderate risk at this time.  Time:   Today, I have spent 25 minutes with the patient with telehealth technology discussing sleep apnea, coronary disease, hypertension, dyslipidemia, obesity, type 2 diabetes and palpitations.     Medication  Adjustments/Labs and  Tests Ordered: Current medicines are reviewed at length with the patient today.  Concerns regarding medicines are outlined above.   Tests Ordered: No orders of the defined types were placed in this encounter.   Medication Changes: No orders of the defined types were placed in this encounter.   Disposition:  in 6 month(s)  Chrystie Nose, MD, [facility], FACP  [place]  [facility]  Medical Director of the Advanced Lipid Disorders &  Cardiovascular Risk Reduction Clinic Diplomate of the American Board of Clinical Lipidology Attending Cardiologist  Direct Dial: [phone]  Fax: [phone]  Website:  [email or web address]  Chrystie Nose, MD  07/23/2018 11:15 AM

## 2018-07-23 NOTE — Patient Instructions (Signed)
Medication Instructions:  Your Physician recommend you continue on your current medication as directed.    If you need a refill on your cardiac medications before your next appointment, please call your pharmacy.   Lab work: None  Testing/Procedures: None  Follow-Up: At [facility], you and your health needs are our priority.  As part of our continuing mission to provide you with exceptional heart care, we have created designated Provider Care Teams.  These Care Teams include your primary Cardiologist (physician) and Advanced Practice Providers (APPs -  Physician Assistants and Nurse Practitioners) who all work together to provide you with the care you need, when you need it. You will need a follow up appointment in 6 months.  Please call our office 2 months in advance to schedule this appointment.  You may see Chrystie Nose, MD or one of the following Advanced Practice Providers on your designated Care Team: [place], [place] . Micah Flesher, PA-C  d

## 2018-07-25 ENCOUNTER — Other Ambulatory Visit: Payer: Self-pay | Admitting: Internal Medicine

## 2018-07-31 ENCOUNTER — Other Ambulatory Visit: Payer: Self-pay | Admitting: Internal Medicine

## 2018-07-31 MED FILL — FLUoxetine HCL 20 MG CAPS: 20 | 90 days supply | Qty: 90 | Fill #3

## 2018-07-31 NOTE — Telephone Encounter (Signed)
Micardis 40 mg refilled. 

## 2018-08-01 MED FILL — TELMISARTAN 40 MG TABLET: 40 | 30 days supply | Qty: 30 | Fill #0

## 2018-08-06 DIAGNOSIS — G4733 Obstructive sleep apnea (adult) (pediatric): Secondary | ICD-10-CM | POA: Diagnosis not present

## 2018-08-26 DIAGNOSIS — G4733 Obstructive sleep apnea (adult) (pediatric): Secondary | ICD-10-CM | POA: Diagnosis not present

## 2018-08-27 MED FILL — METOPROLOL SUCCINATE ER 25: 25 | 90 days supply | Qty: 45 | Fill #3

## 2018-09-06 DIAGNOSIS — G4733 Obstructive sleep apnea (adult) (pediatric): Secondary | ICD-10-CM | POA: Diagnosis not present

## 2018-09-07 ENCOUNTER — Telehealth: Payer: Self-pay | Admitting: Neurology

## 2018-09-07 NOTE — Telephone Encounter (Signed)
Due to current COVID 19 pandemic, our office is severely reducing in office visits until further notice, in order to minimize the risk to our patients and healthcare providers.   Called patient and offered a virtual visit for her 6/23 appt. Patient accepted and I have sent her appt info through [place].   Pt understands that although there may be some limitations with this type of visit, we will take all precautions to reduce any security or privacy concerns.  Pt understands that this will be treated like an in office visit and we will file with pt's insurance, and there may be a patient responsible charge related to this service.

## 2018-09-09 NOTE — Telephone Encounter (Signed)
Called the patient to review her chart and there was no answer. LVM asking the pt to call back.   If pt calls back please ask if she would like to keep this apt or push it out. When I look at her CPAP compliance download for last 30 days she has not been compliant. It shows she has only used the machine 20/30 with only 5 of those days did she use the machine greater then 4 hrs. Patient must at the very minimum meet compliance by using the machine greater the  4 hrs each night for at least 21/30 days.   If patient would rather push out the apt we can reschedule her in 1-2 months with MD or NP to allow her time to meet compliance. Otherwise if she would like to keep the apt that is fine too in case she has questions or concerns and I will then review the chart.

## 2018-09-14 NOTE — Telephone Encounter (Signed)
c 

## 2018-09-14 NOTE — Telephone Encounter (Signed)
Called the patient to discuss her chart but we discussed how she is not quite meeting compliance on her machine. Asked the patient about what was going on and she has been waiting to get a mask refit. Due to coronavirus she was unable to get in to [place]. I have advised I would reach out to [employer] for her and she has their number to and informed her to have the refit and that she would have to meet compliance by using the machine > 4 hrs every day. For now we have pushed the apt out to Aug with Amy Lomax to allow more time for the patient to become compliant

## 2018-09-15 ENCOUNTER — Telehealth: Payer: PPO | Admitting: Neurology

## 2018-09-16 ENCOUNTER — Other Ambulatory Visit: Payer: Self-pay | Admitting: Family Medicine

## 2018-09-16 DIAGNOSIS — E119 Type 2 diabetes mellitus without complications: Secondary | ICD-10-CM

## 2018-09-16 MED FILL — CLOPIDOGREL 75 MG TABLET: 75 | 90 days supply | Qty: 90 | Fill #2

## 2018-09-16 MED FILL — TELMISARTAN 40 MG TABLET: 40 | 30 days supply | Qty: 30 | Fill #1

## 2018-09-18 MED FILL — METFORMIN HCL ER 500 MG TB2: 500 | 30 days supply | Qty: 30 | Fill #0

## 2018-09-21 NOTE — Telephone Encounter (Signed)
Opened in error

## 2018-10-06 DIAGNOSIS — G4733 Obstructive sleep apnea (adult) (pediatric): Secondary | ICD-10-CM | POA: Diagnosis not present

## 2018-10-13 MED FILL — METFORMIN HCL ER 500 MG TB2: 500 | 30 days supply | Qty: 30 | Fill #1

## 2018-10-13 MED FILL — TELMISARTAN 40 MG TABLET: 40 | 30 days supply | Qty: 30 | Fill #2

## 2018-10-26 ENCOUNTER — Other Ambulatory Visit: Payer: Self-pay | Admitting: Family Medicine

## 2018-10-26 DIAGNOSIS — Z955 Presence of coronary angioplasty implant and graft: Secondary | ICD-10-CM

## 2018-10-26 DIAGNOSIS — E1169 Type 2 diabetes mellitus with other specified complication: Secondary | ICD-10-CM

## 2018-10-28 ENCOUNTER — Other Ambulatory Visit: Payer: Self-pay | Admitting: Family Medicine

## 2018-10-28 DIAGNOSIS — Z1231 Encounter for screening mammogram for malignant neoplasm of breast: Secondary | ICD-10-CM

## 2018-10-28 MED FILL — SIMVASTATIN 40 MG TABLET: 40 | 90 days supply | Qty: 90 | Fill #0

## 2018-11-03 ENCOUNTER — Other Ambulatory Visit: Payer: Self-pay | Admitting: Cardiology

## 2018-11-03 MED FILL — NITROGLYCERIN 0.4 MG TAB SL: 0.4 | 8 days supply | Qty: 25 | Fill #0

## 2018-11-05 MED FILL — TELMISARTAN 40 MG TABLET: 40 | 30 days supply | Qty: 30 | Fill #3

## 2018-11-06 DIAGNOSIS — G4733 Obstructive sleep apnea (adult) (pediatric): Secondary | ICD-10-CM | POA: Diagnosis not present

## 2018-11-08 NOTE — Patient Instructions (Addendum)
It was great to see you again today, I will be in touch with your labs ASAP  I ordered a bone density which can be scheduled along with your upcoming mammogram You got a flu shot and tetanus booster today   Health Maintenance After Age 69 After age 58, you are at a higher risk for certain long-term diseases and infections as well as injuries from falls. Falls are a major cause of broken bones and head injuries in people who are older than age 35. Getting regular preventive care can help to keep you healthy and well. Preventive care includes getting regular testing and making lifestyle changes as recommended by your health care provider. Talk with your health care provider about:  Which screenings and tests you should have. A screening is a test that checks for a disease when you have no symptoms.  A diet and exercise plan that is right for you. What should I know about screenings and tests to prevent falls? Screening and testing are the best ways to find a health problem early. Early diagnosis and treatment give you the best chance of managing medical conditions that are common after age 39. Certain conditions and lifestyle choices may make you more likely to have a fall. Your health care provider may recommend:  Regular vision checks. Poor vision and conditions such as cataracts can make you more likely to have a fall. If you wear glasses, make sure to get your prescription updated if your vision changes.  Medicine review. Work with your health care provider to regularly review all of the medicines you are taking, including over-the-counter medicines. Ask your health care provider about any side effects that may make you more likely to have a fall. Tell your health care provider if any medicines that you take make you feel dizzy or sleepy.  Osteoporosis screening. Osteoporosis is a condition that causes the bones to get weaker. This can make the bones weak and cause them to break more  easily.  Blood pressure screening. Blood pressure changes and medicines to control blood pressure can make you feel dizzy.  Strength and balance checks. Your health care provider may recommend certain tests to check your strength and balance while standing, walking, or changing positions.  Foot health exam. Foot pain and numbness, as well as not wearing proper footwear, can make you more likely to have a fall.  Depression screening. You may be more likely to have a fall if you have a fear of falling, feel emotionally low, or feel unable to do activities that you used to do.  Alcohol use screening. Using too much alcohol can affect your balance and may make you more likely to have a fall. What actions can I take to lower my risk of falls? General instructions  Talk with your health care provider about your risks for falling. Tell your health care provider if: ? You fall. Be sure to tell your health care provider about all falls, even ones that seem minor. ? You feel dizzy, sleepy, or off-balance.  Take over-the-counter and prescription medicines only as told by your health care provider. These include any supplements.  Eat a healthy diet and maintain a healthy weight. A healthy diet includes low-fat dairy products, low-fat (lean) meats, and fiber from whole grains, beans, and lots of fruits and vegetables. Home safety  Remove any tripping hazards, such as rugs, cords, and clutter.  Install safety equipment such as grab bars in bathrooms and safety rails on stairs.  Keep rooms and walkways well-lit. Activity   Follow a regular exercise program to stay fit. This will help you maintain your balance. Ask your health care provider what types of exercise are appropriate for you.  If you need a cane or walker, use it as recommended by your health care provider.  Wear supportive shoes that have nonskid soles. Lifestyle  Do not drink alcohol if your health care provider tells you not to  drink.  If you drink alcohol, limit how much you have: ? 0-1 drink a day for women. ? 0-2 drinks a day for men.  Be aware of how much alcohol is in your drink. In the [place] one drink equals one typical bottle of beer (12 oz), one-half glass of wine (5 oz), or one shot of hard liquor (1 oz).  Do not use any products that contain nicotine or tobacco, such as cigarettes and e-cigarettes. If you need help quitting, ask your health care provider. Summary  Having a healthy lifestyle and getting preventive care can help to protect your health and wellness after age 69.  Screening and testing are the best way to find a health problem early and help you avoid having a fall. Early diagnosis and treatment give you the best chance for managing medical conditions that are more common for people who are older than age 69.  Falls are a major cause of broken bones and head injuries in people who are older than age 69. Take precautions to prevent a fall at home.  Work with your health care provider to learn what changes you can make to improve your health and wellness and to prevent falls. This information is not intended to replace advice given to you by your health care provider. Make sure you discuss any questions you have with your health care provider. Document Released: 01/22/2017 Document Revised: 07/02/2018 Document Reviewed: 01/22/2017 Elsevier Patient Education  2020 [employer]

## 2018-11-08 NOTE — Progress Notes (Addendum)
[facility] [address], [address] [place], [place] [address] [phone] [phone]  Date:  11/16/2018   Name:  [name] PeonHelen J Simpson   DOB:  1950-05-08   MRN:  [id number]  PCP:  Pearline Cablesopland, Sabeen Piechocki C, MD    Chief Complaint: Annual Exam   History of Present Illness:  [name] PeonHelen J Simpson is a 68 y.o. very pleasant female patient who presents with the following:  Here today for complete physical.  We last visited in November History of CAD status post stent in 2010, obesity, sleep apnea on CPAP managed per neurology, hyperlipidemia, diabetes, spinal stenosis  Her husband [name] had shoulder surgery last week- Myriam JacobsonHelen has been forced to get out and about more on her own which has increased her confidence.  She has not been falling  [name] is not driving for a while   She is doing a virtual follow-up with neurology tomorrow   Lab Results  Component Value Date   HGBA1C 7.0 (H) 02/05/2018    Eye exam:  Pt thinks was done in November, diabetes ok  Mammogram is scheduled for next month A1c, labs are due- she is fasting today  Tetanus vaccine due, suggest Shingrix- per pt done last fall  Pt had a recent wound on her left shin to cover td vaccine  Foot exam due Colonoscopy up-to-date DEXA scan due  Visit with her cardiologist, Dr. Rennis GoldenHilty, in April: 1. ChronicRBBB 2. Coronary artery disease status post DES the proximal LAD in 2010, low risk Myoview (11/2016)-LVEF 78% 3. Hypertension 4. Dyslipidemia 5. Obesity 6. Type 2 diabetes 7. Palpitations 8. OSA on CPAP 9. Anxiety  [name] seems to be doing well from a cardiac standpoint.  She denies any new chest pain or worsening shortness of breath.  Her blood pressures well controlled today.  Weight is down a few pounds however she is still considered morbidly obese with BMI greater than 40.  She has type 2 diabetes with hemoglobin A1c of 7 indicating reasonable control.  Her cholesterol will be due for recheck  this summer and is followed by her PCP.  She denies any recurrent palpitations.  She recently saw her sleep specialist who will be fitting her with a new mask due to some leaks.  Otherwise she is doing well.  Aspirin 81 Plavix Pepcid OTC  Prozac 20 Metformin 500 once a day Toprol-XL 12.5 daily Telmisartan 40 Zocor 40 Patient Active Problem List   Diagnosis Date Noted  . Obstructive sleep apnea treated with continuous positive airway pressure (CPAP) 05/07/2018  . Chest pain 01/31/2018  . Morbid obesity (HCC) 11/03/2017  . Coronary artery disease involving native coronary artery of native heart with unstable angina pectoris (HCC) 11/03/2017  . Insomnia 11/03/2017  . Inadequate sleep hygiene 11/03/2017  . Anxiety 07/21/2017  . Dizziness 10/02/2016  . Right patella fracture 08/29/2016  . Acquired foot deformity, left 07/18/2016  . Osteopenia 11/10/2015  . HNP (herniated nucleus pulposus), lumbar 10/12/2014  . Spinal stenosis at L4-L5 level 10/12/2014  . Iron deficiency anemia 07/29/2014  . DM (diabetes mellitus) with complications (HCC) 07/29/2014  . Environmental and seasonal allergies 04/28/2014  . Spinal stenosis, lumbar region, with neurogenic claudication 01/06/2013  . Obesity, morbid, BMI 40.0-49.9 (HCC) 10/20/2012  . Chest pain with moderate risk of acute coronary syndrome 10/02/2012  . CAD S/P percutaneous coronary angioplasty   . Hyperlipidemia with target LDL less than 70   . Essential hypertension 04/16/2012  Past Medical History:  Diagnosis Date  . Anemia   . Arthritis   . Bronchitis    hx of   . CAD (coronary artery disease)    a. s/p DES to LAD in 2010 with patent stent by cath in 2014 b. low-risk NST in 11/2016  . Cataracts, bilateral   . Diabetes mellitus without complication (HCC)   . Family history of adverse reaction to anesthesia    pts mother had difficulty awakening   . GERD (gastroesophageal reflux disease)   . Hyperlipidemia LDL goal < 70   .  Hypertension   . Lumbar back pain   . Numbness    left leg and foot   . Plantar fascia rupture    Left Foot  . Sleep apnea    uses CPAP     Past Surgical History:  Procedure Laterality Date  . ABDOMINAL HYSTERECTOMY    . BACK SURGERY    . BREAST CYST ASPIRATION  1995  . CAROTID STENT  2009   pt denies   . CORONARY ANGIOPLASTY WITH STENT PLACEMENT  2010and 10-02-2012   Stent DES, Xience to prox. LAD  . DOPPLER ECHOCARDIOGRAPHY  08/01/2009   EF=>55%,LV normal  . LEFT HEART CATHETERIZATION WITH CORONARY ANGIOGRAM N/A 10/02/2012   Procedure: LEFT HEART CATHETERIZATION WITH CORONARY ANGIOGRAM;  Surgeon: Runell GessJonathan J Berry, MD;  [facility];  Service: Cardiovascular;  Laterality: N/A;  . lower arterial duplex  06/20/10   abi's normal,rgt 0.98,lft 1.06;bilateral PVRs normal  . LUMBAR LAMINECTOMY/DECOMPRESSION MICRODISCECTOMY Left 01/06/2013   Procedure: MICRO LUMBAR DECOMPRESSION L4-5 AND L5-S1;  Surgeon: Javier DockerJeffrey C Beane, MD;  Location: [facility] ORS;  Service: Orthopedics;  Laterality: Left;  . LUMBAR LAMINECTOMY/DECOMPRESSION MICRODISCECTOMY Left 10/12/2014   Procedure: REVISION MICRO LUMBAR/DECOMPRESSION L4-5 LEFT ;  Surgeon: Jene EveryJeffrey Beane, MD;  Location: [facility] ORS;  Service: Orthopedics;  Laterality: Left;  . NM MYOCAR PERF WALL MOTION  09/22/2008   lexiscan-EF 83%; glogal LV systolic fx is norm. ,evidence of mild ischemia basal anterior,midanterior and apical lateral region(s).   . ORIF PATELLA Right 08/29/2016   Procedure: OPEN REDUCTION INTERNAL (ORIF) FIXATION RIGHT PATELLA;  Surgeon: Samson FredericSwinteck, Brian, MD;  Location: [facility] ORS;  Service: Orthopedics;  Laterality: Right;  Adductor Block  . TUBAL LIGATION    . TYMPANOPLASTY Bilateral   . UVULOPALATOPHARYNGOPLASTY     pt denies     Social History   Tobacco Use  . Smoking status: Never Smoker  . Smokeless tobacco: Never Used  Substance Use Topics  . Alcohol use: Yes    Comment: occasional, 1 a month wine  . Drug use: No    Family  History  Problem Relation Age of Onset  . Coronary artery disease Mother   . Rheum arthritis Mother   . Dementia Mother   . Heart attack Father   . Hypertension Father   . Hyperlipidemia Father   . Other Father        MVA  . Hypertension Brother   . Cancer Brother   . Heart disease Brother   . Cancer Paternal Grandmother        stomach  . Diabetes Paternal Grandfather   . Breast cancer Neg Hx     Allergies  Allergen Reactions  . Niacin And Related Other (See Comments)    [place] and skin flushed, mouth tingling    Medication list has been reviewed and updated.  Current Outpatient Medications on File Prior to Visit  Medication Sig Dispense Refill  .  acetaminophen (TYLENOL) 500 MG tablet Take 500 mg by mouth every 6 (six) hours as needed for mild pain or headache.    Marland Kitchen. aspirin EC 81 MG EC tablet Take 1 tablet (81 mg total) by mouth daily. 30 tablet 1  . calcium carbonate (OS-CAL) 1250 (500 Ca) MG chewable tablet Chew 1 tablet by mouth daily.    . cetirizine (ZYRTEC) 10 MG chewable tablet Chew 10 mg by mouth daily as needed for allergies.    . cholecalciferol (VITAMIN D) 1000 UNITS tablet Take 1,000 Units by mouth daily.    . clopidogrel (PLAVIX) 75 MG tablet TAKE 1 TABLET BY MOUTH DAILY. 90 tablet 3  . cyclobenzaprine (FLEXERIL) 10 MG tablet TAKE 1 TABLET BY MOUTH TWICE DAILY AS NEEDED FOR MUSCLE SPASMS 30 tablet 2  . diazepam (VALIUM) 2 MG tablet TAKE 1 TABLET BY MOUTH EVERY 12 hours as needed for anxiety (Patient taking differently: Take 2 mg by mouth every 12 (twelve) hours as needed for muscle spasms. ) 40 tablet 1  . diclofenac sodium (VOLTAREN) 1 % GEL Apply 2 g topically 4 (four) times daily.    . famotidine (PEPCID) 20 MG tablet TAKE 1 TABLET BY MOUTH 2 TIMES DAILY. 180 tablet 1  . fluticasone (FLONASE) 50 MCG/ACT nasal spray Place 1 spray into both nostrils daily as needed for allergies or rhinitis.    . metFORMIN (GLUCOPHAGE-XR) 500 MG 24 hr tablet TAKE 1 TABLET BY  MOUTH ONCE DAILY WITH BREAKFAST 90 tablet 1  . Multiple Vitamin (MULTIVITAMIN WITH MINERALS) TABS tablet Take 1 tablet by mouth daily.    . nitroGLYCERIN (NITROSTAT) 0.4 MG SL tablet PLACE 1 TABLET UNDER THE TONGUE EVERY 5 MINUTES FOR 3 DOSES AS NEEDED FOR CHEST PAIN. 25 tablet 0  . simvastatin (ZOCOR) 40 MG tablet TAKE 1 TABLET BY MOUTH EVERY DAY 90 tablet 1  . telmisartan (MICARDIS) 40 MG tablet TAKE 1 TABLET BY MOUTH EVERY MORNING 90 tablet 3   No current facility-administered medications on file prior to visit.     Review of Systems:  As per HPI- otherwise negative. No chest pain or shortness of breath Chronic issues with her left foot do limit her exercise somewhat Weight is stable Wt Readings from Last 3 Encounters:  11/16/18 214 lb (97.1 kg)  07/23/18 217 lb (98.4 kg)  05/07/18 219 lb 6.4 oz (99.5 kg)     Physical Examination: Vitals:   11/16/18 0940  BP: 128/72  Pulse: (!) 58  Resp: 16  Temp: (!) 96.9 F (36.1 C)  SpO2: 97%   Vitals:   11/16/18 0940  Weight: 214 lb (97.1 kg)  Height: 5\' 1"  (1.549 m)   Body mass index is 40.43 kg/m. Ideal Body Weight: Weight in (lb) to have BMI = 25: 132  GEN: WDWN, NAD, Non-toxic, A & O x 3, obese, looks well HEENT: Atraumatic, Normocephalic. Neck supple. No masses, No LAD.  TM within normal limits bilaterally Ears and Nose: No external deformity. CV: RRR, No M/G/R. No JVD. No thrill. No extra heart sounds. PULM: CTA B, no wheezes, crackles, rhonchi. No retractions. No resp. distress. No accessory muscle use. ABD: S, NT, ND, +BS. No rebound. No HSM. EXTR: No c/c/e NEURO Normal gait.  PSYCH: Normally interactive. Conversant. Not depressed or anxious appearing.  Calm demeanor.  Bilateral feet display normal pulses and monofilament sensation.  She does have a very flat foot on the left, she reports that Dr. 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  It has always given her some trouble since she was a child Healing  small wound on left leg-does not appear infected  Assessment and Plan: Physical exam  Mild anemia - Plan: CBC  Essential hypertension - Plan: CBC, Comprehensive metabolic panel, metoprolol succinate (TOPROL-XL) 25 MG 24 hr tablet  Spinal stenosis, lumbar region, with neurogenic claudication  DM (diabetes mellitus) with complications ([place]) - Plan: Hemoglobin A1c, Microalbumin / creatinine urine ratio  CAD S/P percutaneous coronary angioplasty - Plan: Lipid panel  Hyperlipidemia with target LDL less than 70 - Plan: Lipid panel  Estrogen deficiency - Plan: DG Bone Density  Immunization due - Plan: Td vaccine greater than or equal to 7yo preservative free IM  GAD (generalized anxiety disorder) - Plan: FLUoxetine (PROZAC) 20 MG capsule  Needs flu shot - Plan: Flu Vaccine QUAD High Dose(Fluad)  Here today for complete physical Tetanus vaccine given, flu shot Labs pain as above Mammogram and bone density will be scheduled Refilled medications Blood pressure under control A1c to monitor diabetes  Signed Lamar Blinks, MD  Received her labs 8/25, message to patient Results for orders placed or performed in visit on 11/16/18  CBC  Result Value Ref Range   WBC 9.9 4.0 - 10.5 K/uL   RBC 4.75 3.87 - 5.11 Mil/uL   Platelets 307.0 150.0 - 400.0 K/uL   Hemoglobin 12.3 12.0 - 15.0 g/dL   HCT 38.2 36.0 - 46.0 %   MCV 80.4 78.0 - 100.0 fl   MCHC 32.1 30.0 - 36.0 g/dL   RDW 14.3 11.5 - 15.5 %  Comprehensive metabolic panel  Result Value Ref Range   Sodium 140 135 - 145 mEq/L   Potassium 4.7 3.5 - 5.1 mEq/L   Chloride 102 96 - 112 mEq/L   CO2 30 19 - 32 mEq/L   Glucose, Bld 158 (H) 70 - 99 mg/dL   BUN 14 6 - 23 mg/dL   Creatinine, Ser 0.76 0.40 - 1.20 mg/dL   Total Bilirubin 0.5 0.2 - 1.2 mg/dL   Alkaline Phosphatase 112 39 - 117 U/L   AST 16 0 - 37 U/L   ALT 18 0 - 35 U/L   Total Protein 6.8 6.0 - 8.3 g/dL   Albumin 4.6 3.5 - 5.2 g/dL   Calcium 10.1 8.4 - 10.5 mg/dL    GFR 75.44 >60.00 mL/min  Hemoglobin A1c  Result Value Ref Range   Hgb A1c MFr Bld 7.6 (H) 4.6 - 6.5 %  Lipid panel  Result Value Ref Range   Cholesterol 172 0 - 200 mg/dL   Triglycerides 269.0 (H) 0.0 - 149.0 mg/dL   HDL 32.90 (L) >39.00 mg/dL   VLDL 53.8 (H) 0.0 - 40.0 mg/dL   Total CHOL/HDL Ratio 5    NonHDL 138.77   Microalbumin / creatinine urine ratio  Result Value Ref Range   Microalb, Ur 0.8 0.0 - 1.9 mg/dL   Creatinine,U 66.1 mg/dL   Microalb Creat Ratio 1.1 0.0 - 30.0 mg/g  LDL cholesterol, direct  Result Value Ref Range   Direct LDL 82.0 mg/dL     Blood counts normal Metabolic profile looks fine Your A1c is slightly above goal.  I would like to see you at 7.5, or even 7%. Your last 2 A1c results were better.  Let's not change any medications right now, please just work on diet and let us repeat in 4 to 6 months  Your cholesterol is still higher than I would like, giving you a  higher than ideal risk of cardiovascular disease-  The 10-year ASCVD risk score Denman George DC Jr., et al., 2013) is: 22.2%   Values used to calculate the score:     Age: 29 years     Sex: Female     Is Non-Hispanic African American: No     Diabetic: Yes     Tobacco smoker: No     Systolic Blood Pressure: 128 mmHg     Is BP treated: Yes     HDL Cholesterol: 32.9 mg/dL     Total Cholesterol: 172 mg/dL  I wonder if you might benefit from 1 of the newer cholesterol-lowering medications, such as Repatha.  Let me touch base with Dr. Rennis Golden about this for you-we will let you know what he says  Otherwise, let's visit in 4-6 months.  Take care!  8/26-  Message from Dr Rennis Golden as follows:      PCSK9 would be indicated - her triglycerides are also high, though - she may do better by adding Vascepa 2G BID - given her diabetes and coronary disease with persistently elevated triglycerides, she fits the typical patient in the REDUCE-IT trial that had ~25% RRR of MACE on top of statin therapy. Cost may be an  issue.   [place]    Sent message to pt asking if she is ok with starting vascepa

## 2018-11-09 ENCOUNTER — Other Ambulatory Visit: Payer: Self-pay | Admitting: Family Medicine

## 2018-11-09 ENCOUNTER — Ambulatory Visit: Payer: Self-pay | Admitting: Family Medicine

## 2018-11-09 DIAGNOSIS — F411 Generalized anxiety disorder: Secondary | ICD-10-CM

## 2018-11-09 MED FILL — FLUoxetine HCL 20 MG CAPS: 20 | 90 days supply | Qty: 90 | Fill #0

## 2018-11-09 MED FILL — metFORMIN HCL ER 500 MG TB2: 500 | 30 days supply | Qty: 30 | Fill #2

## 2018-11-12 ENCOUNTER — Telehealth: Payer: Self-pay | Admitting: Family Medicine

## 2018-11-12 NOTE — Telephone Encounter (Signed)
Pt returned my call and gave consent for a virtual visit via [place]. I rescheduled her appt to 8/25 at 9:30 am. Patient verbalized understanding of how the virtual visit will be conducted via [place].

## 2018-11-12 NOTE — Telephone Encounter (Signed)
I called patient regarding rescheduling her 9/14 appointment due to NP time off. Patient recently rescheduled her 8/17 appointment for cpap follow-up due to having to take care of her spouse. I advised patient that if she would like, she may do a virtual visit via her mychart account if it is more convenient for her. I requested patient call back to discuss and gave patient my direct line to call.

## 2018-11-16 ENCOUNTER — Encounter: Payer: Self-pay | Admitting: Family Medicine

## 2018-11-16 ENCOUNTER — Ambulatory Visit (INDEPENDENT_AMBULATORY_CARE_PROVIDER_SITE_OTHER): Payer: PPO | Admitting: Family Medicine

## 2018-11-16 ENCOUNTER — Other Ambulatory Visit: Payer: Self-pay

## 2018-11-16 VITALS — BP 128/72 | HR 58 | Temp 96.9°F | Resp 16 | Ht 61.0 in | Wt 214.0 lb

## 2018-11-16 DIAGNOSIS — M48062 Spinal stenosis, lumbar region with neurogenic claudication: Secondary | ICD-10-CM | POA: Diagnosis not present

## 2018-11-16 DIAGNOSIS — S81802A Unspecified open wound, left lower leg, initial encounter: Secondary | ICD-10-CM | POA: Diagnosis not present

## 2018-11-16 DIAGNOSIS — Z0000 Encounter for general adult medical examination without abnormal findings: Secondary | ICD-10-CM | POA: Diagnosis not present

## 2018-11-16 DIAGNOSIS — I2510 Atherosclerotic heart disease of native coronary artery without angina pectoris: Secondary | ICD-10-CM | POA: Diagnosis not present

## 2018-11-16 DIAGNOSIS — D649 Anemia, unspecified: Secondary | ICD-10-CM | POA: Diagnosis not present

## 2018-11-16 DIAGNOSIS — Z9861 Coronary angioplasty status: Secondary | ICD-10-CM

## 2018-11-16 DIAGNOSIS — E118 Type 2 diabetes mellitus with unspecified complications: Secondary | ICD-10-CM | POA: Diagnosis not present

## 2018-11-16 DIAGNOSIS — I10 Essential (primary) hypertension: Secondary | ICD-10-CM | POA: Diagnosis not present

## 2018-11-16 DIAGNOSIS — E2839 Other primary ovarian failure: Secondary | ICD-10-CM | POA: Diagnosis not present

## 2018-11-16 DIAGNOSIS — F411 Generalized anxiety disorder: Secondary | ICD-10-CM | POA: Diagnosis not present

## 2018-11-16 DIAGNOSIS — E785 Hyperlipidemia, unspecified: Secondary | ICD-10-CM

## 2018-11-16 DIAGNOSIS — Z23 Encounter for immunization: Secondary | ICD-10-CM | POA: Diagnosis not present

## 2018-11-16 LAB — COMPREHENSIVE METABOLIC PANEL
ALT: 18 U/L (ref 0–35)
AST: 16 U/L (ref 0–37)
Albumin: 4.6 g/dL (ref 3.5–5.2)
Alkaline Phosphatase: 112 U/L (ref 39–117)
BUN: 14 mg/dL (ref 6–23)
CO2: 30 mEq/L (ref 19–32)
Calcium: 10.1 mg/dL (ref 8.4–10.5)
Chloride: 102 mEq/L (ref 96–112)
Creatinine, Ser: 0.76 mg/dL (ref 0.40–1.20)
GFR: 75.44 mL/min (ref >60.00)
Glucose, Bld: 158 mg/dL — ABNORMAL HIGH (ref 70–99)
Potassium: 4.7 mEq/L (ref 3.5–5.1)
Sodium: 140 mEq/L (ref 135–145)
Total Bilirubin: 0.5 mg/dL (ref 0.2–1.2)
Total Protein: 6.8 g/dL (ref 6.0–8.3)

## 2018-11-16 LAB — MICROALBUMIN / CREATININE URINE RATIO
Creatinine,U: 66.1 mg/dL
Microalb Creat Ratio: 1.1 mg/g (ref 0.0–30.0)
Microalb, Ur: 0.8 mg/dL (ref 0.0–1.9)

## 2018-11-16 LAB — CBC
HCT: 38.2 % (ref 36.0–46.0)
Hemoglobin: 12.3 g/dL (ref 12.0–15.0)
MCHC: 32.1 g/dL (ref 30.0–36.0)
MCV: 80.4 fl (ref 78.0–100.0)
Platelets: 307 10*3/uL (ref 150.0–400.0)
RBC: 4.75 Mil/uL (ref 3.87–5.11)
RDW: 14.3 % (ref 11.5–15.5)
WBC: 9.9 10*3/uL (ref 4.0–10.5)

## 2018-11-16 LAB — LIPID PANEL
Cholesterol: 172 mg/dL (ref 0–200)
HDL: 32.9 mg/dL — ABNORMAL LOW (ref >39.00)
NonHDL: 138.77
Total CHOL/HDL Ratio: 5
Triglycerides: 269 mg/dL — ABNORMAL HIGH (ref 0.0–149.0)
VLDL: 53.8 mg/dL — ABNORMAL HIGH (ref 0.0–40.0)

## 2018-11-16 LAB — LDL CHOLESTEROL, DIRECT: Direct LDL: 82 mg/dL

## 2018-11-16 LAB — HEMOGLOBIN A1C: Hgb A1c MFr Bld: 7.6 % — ABNORMAL HIGH (ref 4.6–6.5)

## 2018-11-16 MED ORDER — FLUOXETINE HCL 20 MG PO CAPS: 20.0000 mg | ORAL_CAPSULE | Freq: Every day | ORAL | 3 refills | Status: DC

## 2018-11-16 MED ORDER — METOPROLOL SUCCINATE ER 25 MG PO TB24: 12.5000 mg | ORAL_TABLET | Freq: Every day | ORAL | 3 refills | Status: DC

## 2018-11-16 MED FILL — METOPROLOL SUCCINATE ER 25: 25 | 90 days supply | Qty: 45 | Fill #0

## 2018-11-17 ENCOUNTER — Telehealth (INDEPENDENT_AMBULATORY_CARE_PROVIDER_SITE_OTHER): Payer: PPO | Admitting: Family Medicine

## 2018-11-17 DIAGNOSIS — Z9989 Dependence on other enabling machines and devices: Secondary | ICD-10-CM

## 2018-11-17 DIAGNOSIS — G4733 Obstructive sleep apnea (adult) (pediatric): Secondary | ICD-10-CM

## 2018-11-17 NOTE — Progress Notes (Signed)
PATIENT: [name] SatoHelen J Simpson DOB: 1950-04-15  REASON FOR VISIT: follow up HISTORY FROM: patient  Virtual Visit via Telephone Note  I connected with [name] on 11/17/18 at  9:30 AM EDT by telephone and verified that I am speaking with the correct person using two identifiers.   I discussed the limitations, risks, security and privacy concerns of performing an evaluation and management service by telephone and the availability of in person appointments. I also discussed with the patient that there may be a patient responsible charge related to this service. The patient expressed understanding and agreed to proceed.   History of Present Illness:  11/17/18 [name] is a 68 y.o. female here today for follow up of OSA on CPAP. She is doing better with compliance but admits that she struggles to get 4 hours each night. She reports being up and down at night and does not replace therapy once she wakes.  Compliance report dated 10/17/2018 through 11/15/2018 reveals that she has used CPAP 27 of the last 30 days for compliance of 90%.  17 of those days she used CPAP greater than 4 hours for compliance of 57%.  On average she used CPAP 4 hours and 17 minutes.  AHI was 1 on the AR-1.  There was no significant leak.   History (copied from [place] [employer] Works[employer]yn Simpson's note on 05/07/2018)  UPDATE 2/13/2020CM [name], 68 year old female returns for follow-up with a history of obstructive sleep apnea here for initial CPAP compliance.  She continues to complain with fatigue with the machine.  She is noted to have a leak.  Compliance data dated 04/06/2018-to 05/05/2018 shows compliance greater than 4 hours at 93%.  Average usage 7 hours 16 minutes.  Set pressure 6 to 10 cm.  EPR level 1 leak 95th percentile 24.7 max 43.5.  AHI 1.6 ESS 12.  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  Joycelyn SchmidVikram Simpson for sleep study the date of the study was 13 February 2014 she had a endorsed coronary artery disease, obesity, asthma, anemia, diabetes mellitus with nocturia, neuropathy, hypertension, GERD, hyperlipidemia and she has been followed for memory loss has had extensive work-up with [facility]. Her husband had witnessed loud snoring and she also struggled with insomnia and frequent awakenings at night. At the time she had a BMI of 42.1 Epworth Sleepiness Scale was endorsed at 10 point. Patient was diagnosed with mild apnea at an AHI of 11.2, which exacerbated during REM sleep to 44.7, there was minor accentuation during supine sleep versus nonsupine sleep. 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  She was placed on CPAP and reports that she used the machine compliantly-advised that she had to use it 4 hours each night, but then received a phone call from the DME stating that she was no longer in compliance and she had to return her CPAP machine this was about 2-1/2 years ago. Her husband interjjected that had not used it Due to nocturia- "the up and down and being tethered".   Sleep habits are as follows: Dinner time is usually 6 PM, and she may use her stationary bike some afternoons. Her bed time is 10 PM or ater, not later than midnight. Her bedroom is cool, quiet  and dark- uses a fan. She is usually asleep by 12.00 but she sleeps in the same bed with a her great- granddaughter ( 4) , and is frequently woken.  She rises at 3.30 and 6.30 for nocturia- every 3 hours . She will sleep again until 10 AM. She naps by 2 pm- her sleep habits have deteriorated. She snores and she still has apnea.    Observations/Objective:  Generalized: Well developed, in no acute distress  Mentation: Alert oriented to time, place, history taking. Follows all commands speech and language  fluent   Assessment and Plan:  68 y.o. year old female  has a past medical history of Anemia, Arthritis, Bronchitis, CAD (coronary artery disease), Cataracts, bilateral, Diabetes mellitus without complication ([place]), Family history of adverse reaction to anesthesia, GERD (gastroesophageal reflux disease), Hyperlipidemia LDL goal < 70, Hypertension, Lumbar back pain, Numbness, Plantar fascia rupture, and Sleep apnea. here with    ICD-10-CM   1. Obstructive sleep apnea treated with continuous positive airway pressure (CPAP)  G47.33    Z99.89     [name] is doing better with compliance now at 90%.  We need to continue working towards a minimum of 4-hour goal as were our compliance is 57%.  She states that over the last couple weeks she has more comfortable with CPAP therapy and feels that she will be able to continue therapy.  She was encouraged to use CPAP nightly and 4 hours each night.  She may also place therapy for daytime naps.  She will follow-up with me in 3 months, sooner with any concerns.  She verbalizes understanding and agreement with this plan.  No orders of the defined types were placed in this encounter.   No orders of the defined types were placed in this encounter.    Follow Up Instructions:  I discussed the assessment and treatment plan with the patient. The patient was provided an opportunity to ask questions and all were answered. The patient agreed with the plan and demonstrated an understanding of the instructions.   The patient was advised to call back or seek an in-person evaluation if the symptoms worsen or if the condition fails to improve as anticipated.  I provided 20 minutes of non-face-to-face time during this encounter.  [name] is located at her place of residence.  Provider is located in the office.   [name] Presto, NP

## 2018-11-18 ENCOUNTER — Encounter: Payer: Self-pay | Admitting: Family Medicine

## 2018-11-18 DIAGNOSIS — E785 Hyperlipidemia, unspecified: Secondary | ICD-10-CM

## 2018-11-18 MED ORDER — VASCEPA 1 G PO CAPS: 2.0000 | ORAL_CAPSULE | Freq: Two times a day (BID) | ORAL | 11 refills | Status: DC

## 2018-11-18 MED FILL — VASCEPA 1 GM CAPSULE: 1 | 30 days supply | Qty: 120 | Fill #0

## 2018-11-27 DIAGNOSIS — H903 Sensorineural hearing loss, bilateral: Secondary | ICD-10-CM | POA: Diagnosis not present

## 2018-11-27 DIAGNOSIS — H6983 Other specified disorders of Eustachian tube, bilateral: Secondary | ICD-10-CM | POA: Diagnosis not present

## 2018-12-07 ENCOUNTER — Ambulatory Visit: Payer: Self-pay | Admitting: Family Medicine

## 2018-12-07 DIAGNOSIS — G4733 Obstructive sleep apnea (adult) (pediatric): Secondary | ICD-10-CM | POA: Diagnosis not present

## 2018-12-11 ENCOUNTER — Ambulatory Visit: Payer: PPO

## 2018-12-16 MED FILL — CLOPIDOGREL 75 MG TABLET: 75 | 90 days supply | Qty: 90 | Fill #3

## 2018-12-16 MED FILL — METFORMIN HCL ER 500 MG TB2: 500 | 90 days supply | Qty: 90 | Fill #3

## 2018-12-16 MED FILL — TELMISARTAN 40 MG TABLET: 40 | 30 days supply | Qty: 30 | Fill #4

## 2018-12-25 DIAGNOSIS — G4733 Obstructive sleep apnea (adult) (pediatric): Secondary | ICD-10-CM | POA: Diagnosis not present

## 2019-01-06 DIAGNOSIS — G4733 Obstructive sleep apnea (adult) (pediatric): Secondary | ICD-10-CM | POA: Diagnosis not present

## 2019-01-20 ENCOUNTER — Encounter: Payer: Self-pay | Admitting: Family Medicine

## 2019-01-20 MED ORDER — METHOCARBAMOL 500 MG PO TABS: 500.0000 mg | ORAL_TABLET | Freq: Three times a day (TID) | ORAL | 0 refills | Status: DC | PRN

## 2019-01-20 MED FILL — TELMISARTAN 40 MG TABLET: 40 | 30 days supply | Qty: 30 | Fill #5

## 2019-01-20 NOTE — Addendum Note (Signed)
Addended by: Lamar Blinks C on: 01/20/2019 03:02 PM   Modules accepted: Orders

## 2019-01-23 MED FILL — METHOCARBAMOL 500 MG TABLET: 500 | 13 days supply | Qty: 40 | Fill #0

## 2019-02-01 MED FILL — SIMVASTATIN 40 MG TABLET: 40 | 90 days supply | Qty: 90 | Fill #1

## 2019-02-02 ENCOUNTER — Other Ambulatory Visit: Payer: Self-pay

## 2019-02-02 ENCOUNTER — Ambulatory Visit
Admission: RE | Admit: 2019-02-02 | Discharge: 2019-02-02 | Disposition: A | Discharge status: Home / Self Care | Payer: PPO | Source: Ambulatory Visit | Attending: Family Medicine | Admitting: Family Medicine

## 2019-02-02 DIAGNOSIS — M8589 Other specified disorders of bone density and structure, multiple sites: Secondary | ICD-10-CM | POA: Diagnosis not present

## 2019-02-02 DIAGNOSIS — Z1231 Encounter for screening mammogram for malignant neoplasm of breast: Secondary | ICD-10-CM

## 2019-02-02 DIAGNOSIS — E2839 Other primary ovarian failure: Secondary | ICD-10-CM

## 2019-02-02 DIAGNOSIS — Z780 Asymptomatic menopausal state: Secondary | ICD-10-CM | POA: Diagnosis not present

## 2019-02-03 ENCOUNTER — Encounter: Payer: Self-pay | Admitting: Family Medicine

## 2019-02-03 DIAGNOSIS — M8580 Other specified disorders of bone density and structure, unspecified site: Secondary | ICD-10-CM

## 2019-02-03 MED ORDER — RISEDRONATE SODIUM 35 MG PO TABS: 35.0000 mg | ORAL_TABLET | ORAL | 3 refills | Status: DC

## 2019-02-03 MED FILL — RISEDRONATE NA 35 MG TAB: 35 | 84 days supply | Qty: 12 | Fill #0

## 2019-02-06 DIAGNOSIS — G4733 Obstructive sleep apnea (adult) (pediatric): Secondary | ICD-10-CM | POA: Diagnosis not present

## 2019-02-09 MED FILL — FLUoxetine HCL 20 MG CAPS: 20 | 90 days supply | Qty: 90 | Fill #0

## 2019-02-16 MED FILL — TELMISARTAN 40 MG TABLET: 40 | 30 days supply | Qty: 30 | Fill #6

## 2019-02-19 NOTE — Progress Notes (Signed)
PATIENT: [name] DOB: 01-May-1950  REASON FOR VISIT: follow up HISTORY FROM: patient  Virtual Visit via Telephone Note  I connected with [name] on 02/22/19 at  9:00 AM EST by telephone and verified that I am speaking with the correct person using two identifiers.   I discussed the limitations, risks, security and privacy concerns of performing an evaluation and management service by telephone and the availability of in person appointments. I also discussed with the patient that there may be a patient responsible charge related to this service. The patient expressed understanding and agreed to proceed.   History of Present Illness:  02/22/19 [name] is a 68 y.o. female here today for follow up of OSA on CPAP.  She reports that she is adjusting well to CPAP therapy.  She reports that she continues to work on daily compliance and minimum of 4-hour usage.  She feels that her main concern is that she does not typically sleep for 4 hours at a time.  She admits to having a bad habit of scrolling on Facebook and then falling asleep without placing CPAP therapy.  She also reports waking multiple times at night to urinate.  Sometimes she replaces CPAP therapy and sometimes she does not.  When she is using CPAP she notes significant improvement in energy levels the following day.  She is very motivated to continue using CPAP.  Compliance report dated 01/18/2019 through 02/16/2019 reveals that she has used CPAP 24 of the last 30 days for compliance of 80%.  13 days she used CPAP greater than 4 hours for compliance of 43%.  Average usage was 3 hours and 46 minutes.  Residual AHI was 1.0 on 6 to 10 cm of water and an EPR of 1.  There was no significant leak noted.  History (copied from my note on 11/17/2018)  [name] is a 68 y.o. female here today for follow up of OSA on CPAP. She is doing better with compliance but admits that she struggles to get 4 hours each night. She reports being  up and down at night and does not replace therapy once she wakes.  Compliance report dated 10/17/2018 through 11/15/2018 reveals that she has used CPAP 27 of the last 30 days for compliance of 90%.  17 of those days she used CPAP greater than 4 hours for compliance of 57%.  On average she used CPAP 4 hours and 17 minutes.  AHI was 1 on the AR-1.  There was no significant leak.   History (copied from [employer] note on 05/07/2018)  UPDATE2/13/2020CMMs. Keagy, 68 year old female returns for follow-up with a history of obstructive sleep apnea here for initial CPAP compliance. She continues to complain with fatigue with the machine. She is noted to have a leak. Compliance data dated 04/06/2018-to 05/05/2018 shows compliance greater than 4 hours at 93%. Average usage 7 hours 16 minutes. Set pressure 6 to 10 cm.EPR level 1 leak 95th percentile 24.7 max 43.5. AHI 1.6 ESS 12. 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 Joycelyn Schmid for sleep study the date of the study was 13 February 2014 she had a endorsed coronary artery disease, obesity, asthma, anemia, diabetes mellitus with nocturia, neuropathy, hypertension, GERD, hyperlipidemia and she has been followed for memory loss has had extensive work-up with Dr. Lenard Lance. Her husband had witnessed  loud snoring and she also struggled with insomnia and frequent awakenings at night. At the time she had a BMI of 42.1 Epworth Sleepiness Scale was endorsed at 10 point. Patient was diagnosed with mild apnea at an AHI of 11.2, which exacerbated during REM sleep to 44.7, there was minor accentuation during supine sleep versus nonsupine sleep. 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  She was placed on  CPAP and reports that she used the machine compliantly-advised that she had to use it 4 hours each night, but then received a phone call from the DME stating that she was no longer in compliance and she had to return her CPAP machine this was about 2-1/2 years ago. Her husband interjjected that had not used it Due to nocturia- "the up and down and being tethered".   Sleep habits are as follows: Dinner time is usually 6 PM, and she may use her stationary bike some afternoons. Her bed time is 10 PM or ater, not later than midnight. Her bedroom is cool, quiet and dark- uses a fan. She is usually asleep by 12.00 but she sleeps in the same bed with a her great- granddaughter ( 4) , and is frequently woken.  She rises at 3.30 and 6.30 for nocturia- every 3 hours . She will sleep again until 10 AM. She naps by 2 pm- her sleep habits have deteriorated. She snores and she still has apnea.    Observations/Objective:  Generalized: Well developed, in no acute distress  Mentation: Alert oriented to time, place, history taking. Follows all commands speech and language fluent   Assessment and Plan:  68 y.o. year old female  has a past medical history of Anemia, Arthritis, Bronchitis, CAD (coronary artery disease), Cataracts, bilateral, Diabetes mellitus without complication ([place]), Family history of adverse reaction to anesthesia, GERD (gastroesophageal reflux disease), Hyperlipidemia LDL goal < 70, Hypertension, Lumbar back pain, Numbness, Plantar fascia rupture, and Sleep apnea. here with    ICD-10-CM   1. OSA on CPAP  G47.33    Z99.89     [name] continues to work towards nightly compliance and greater than 4-hour usage with CPAP therapy.  We have discussed appropriate sleep hygiene.  I have encouraged her to continue working towards a nightly goal and strive for a minimum of 4-hour usage every night.  She is very motivated to continue CPAP therapy as she has noted significant improvement in energy.  We  will follow-up in 6 months, sooner if needed.  She verbalizes understanding and agreement with this plan.  No orders of the defined types were placed in this encounter.   No orders of the defined types were placed in this encounter.    Follow Up Instructions:  I discussed the assessment and treatment plan with the patient. The patient was provided an opportunity to ask questions and all were answered. The patient agreed with the plan and demonstrated an understanding of the instructions.   The patient was advised to call back or seek an in-person evaluation if the symptoms worsen or if the condition fails to improve as anticipated.  I provided 15 minutes of non-face-to-face time during this encounter.  Patient is located at her daughter's place of residence during my chart visit.  Provider is in the office.   Debbora Presto, NP

## 2019-02-22 ENCOUNTER — Telehealth (INDEPENDENT_AMBULATORY_CARE_PROVIDER_SITE_OTHER): Payer: PPO | Admitting: Family Medicine

## 2019-02-22 ENCOUNTER — Encounter: Payer: Self-pay | Admitting: Family Medicine

## 2019-02-22 DIAGNOSIS — Z9989 Dependence on other enabling machines and devices: Secondary | ICD-10-CM | POA: Diagnosis not present

## 2019-02-22 DIAGNOSIS — G4733 Obstructive sleep apnea (adult) (pediatric): Secondary | ICD-10-CM

## 2019-02-25 MED FILL — METOPROLOL SUCCINATE ER 25: 25 | 90 days supply | Qty: 45 | Fill #1

## 2019-03-01 DIAGNOSIS — H52202 Unspecified astigmatism, left eye: Secondary | ICD-10-CM | POA: Diagnosis not present

## 2019-03-01 DIAGNOSIS — H524 Presbyopia: Secondary | ICD-10-CM | POA: Diagnosis not present

## 2019-03-01 DIAGNOSIS — E119 Type 2 diabetes mellitus without complications: Secondary | ICD-10-CM | POA: Diagnosis not present

## 2019-03-01 DIAGNOSIS — Z961 Presence of intraocular lens: Secondary | ICD-10-CM | POA: Diagnosis not present

## 2019-03-16 DIAGNOSIS — M545 Low back pain: Secondary | ICD-10-CM | POA: Diagnosis not present

## 2019-03-16 DIAGNOSIS — M5417 Radiculopathy, lumbosacral region: Secondary | ICD-10-CM | POA: Diagnosis not present

## 2019-03-16 MED FILL — predniSONE 5 MG (21) TBPK: 5 | 6 days supply | Qty: 21 | Fill #0

## 2019-03-16 MED FILL — HYDROCODON-APAP 5-325: 5-325 | 5 days supply | Qty: 20 | Fill #0

## 2019-03-19 MED FILL — TELMISARTAN 40 MG TABLET: 40 | 30 days supply | Qty: 30 | Fill #7

## 2019-03-22 ENCOUNTER — Other Ambulatory Visit: Payer: Self-pay | Admitting: Internal Medicine

## 2019-03-22 ENCOUNTER — Other Ambulatory Visit: Payer: Self-pay | Admitting: Family Medicine

## 2019-03-22 DIAGNOSIS — E119 Type 2 diabetes mellitus without complications: Secondary | ICD-10-CM

## 2019-03-22 MED FILL — CLOPIDOGREL 75 MG TABLET: 75 | 90 days supply | Qty: 90 | Fill #0

## 2019-03-22 MED FILL — metFORMIN HCL ER 500 MG TB2: 500 | 90 days supply | Qty: 90 | Fill #0

## 2019-03-26 MED FILL — VASCEPA 1 GM CAPSULE: 1 | 30 days supply | Qty: 120 | Fill #1

## 2019-04-13 MED FILL — TELMISARTAN 40 MG TABLET: 40 | 30 days supply | Qty: 30 | Fill #8

## 2019-04-14 DIAGNOSIS — G4733 Obstructive sleep apnea (adult) (pediatric): Secondary | ICD-10-CM | POA: Diagnosis not present

## 2019-04-20 DIAGNOSIS — Z23 Encounter for immunization: Secondary | ICD-10-CM | POA: Diagnosis not present

## 2019-05-11 MED FILL — TELMISARTAN 40 MG TABLET: 40 | 30 days supply | Qty: 30 | Fill #9

## 2019-05-11 MED FILL — FLUoxetine HCL 20 MG CAPS: 20 | 90 days supply | Qty: 90 | Fill #1

## 2019-05-12 ENCOUNTER — Telehealth: Payer: Self-pay | Admitting: Family Medicine

## 2019-05-12 NOTE — Progress Notes (Signed)
  Chronic Care Management   Outreach Note  05/12/2019 Name: KAZARIA GAERTNER MRN: [id number] DOB: 1950-10-25  Referred by: Pearline Cables, MD Reason for referral : No chief complaint on file.   An unsuccessful telephone outreach was attempted today. The patient was referred to the pharmacist for assistance with care management and care coordination.   Follow Up Plan:   Raynicia Dukes [employer] Scheduler

## 2019-05-13 DIAGNOSIS — Z23 Encounter for immunization: Secondary | ICD-10-CM | POA: Diagnosis not present

## 2019-05-17 ENCOUNTER — Other Ambulatory Visit: Payer: Self-pay | Admitting: Family Medicine

## 2019-05-17 DIAGNOSIS — E1169 Type 2 diabetes mellitus with other specified complication: Secondary | ICD-10-CM

## 2019-05-17 DIAGNOSIS — E785 Hyperlipidemia, unspecified: Secondary | ICD-10-CM

## 2019-05-17 DIAGNOSIS — Z955 Presence of coronary angioplasty implant and graft: Secondary | ICD-10-CM

## 2019-05-17 MED FILL — SIMVASTATIN 40 MG TABLET: 40 | 90 days supply | Qty: 90 | Fill #0

## 2019-05-18 ENCOUNTER — Telehealth: Payer: Self-pay | Admitting: Family Medicine

## 2019-05-18 NOTE — Chronic Care Management (AMB) (Signed)
  Chronic Care Management   Note  05/18/2019 Name: [name] MRN: [id number] DOB: January 06, 1951  [name] is a 69 y.o. year old female who is a primary care patient of Copland, Gay Filler, MD. I reached out to [place] by phone today in response to a referral sent by [name]'s PCP, Copland, Gay Filler, MD.   [name] was given information about Chronic Care Management services today including:  1. CCM service includes personalized support from designated clinical staff supervised by her physician, including individualized plan of care and coordination with other care providers 2. 24/7 contact phone numbers for assistance for urgent and routine care needs. 3. Service will only be billed when office clinical staff spend 20 minutes or more in a month to coordinate care. 4. Only one practitioner may furnish and bill the service in a calendar month. 5. The patient may stop CCM services at any time (effective at the end of the month) by phone call to the office staff. 6. The patient will be responsible for cost sharing (co-pay) of up to 20% of the service fee (after annual deductible is met).  Patient agreed to services and verbal consent obtained.   Follow up plan:   Raynicia Dukes [employer] Scheduler

## 2019-05-21 ENCOUNTER — Other Ambulatory Visit: Payer: Self-pay

## 2019-05-21 DIAGNOSIS — E118 Type 2 diabetes mellitus with unspecified complications: Secondary | ICD-10-CM

## 2019-05-21 DIAGNOSIS — E785 Hyperlipidemia, unspecified: Secondary | ICD-10-CM

## 2019-05-21 DIAGNOSIS — I10 Essential (primary) hypertension: Secondary | ICD-10-CM

## 2019-05-27 ENCOUNTER — Other Ambulatory Visit: Payer: Self-pay

## 2019-05-27 ENCOUNTER — Ambulatory Visit: Payer: PPO | Admitting: Pharmacist

## 2019-05-27 DIAGNOSIS — E118 Type 2 diabetes mellitus with unspecified complications: Secondary | ICD-10-CM

## 2019-05-27 DIAGNOSIS — F419 Anxiety disorder, unspecified: Secondary | ICD-10-CM

## 2019-05-27 DIAGNOSIS — I10 Essential (primary) hypertension: Secondary | ICD-10-CM

## 2019-05-27 DIAGNOSIS — Z9989 Dependence on other enabling machines and devices: Secondary | ICD-10-CM

## 2019-05-27 DIAGNOSIS — I2510 Atherosclerotic heart disease of native coronary artery without angina pectoris: Secondary | ICD-10-CM

## 2019-05-27 DIAGNOSIS — M5126 Other intervertebral disc displacement, lumbar region: Secondary | ICD-10-CM

## 2019-05-27 DIAGNOSIS — E785 Hyperlipidemia, unspecified: Secondary | ICD-10-CM

## 2019-05-27 DIAGNOSIS — G4733 Obstructive sleep apnea (adult) (pediatric): Secondary | ICD-10-CM

## 2019-05-27 DIAGNOSIS — M8580 Other specified disorders of bone density and structure, unspecified site: Secondary | ICD-10-CM

## 2019-05-27 DIAGNOSIS — J3089 Other allergic rhinitis: Secondary | ICD-10-CM

## 2019-05-27 NOTE — Chronic Care Management (AMB) (Signed)
Chronic Care Management Pharmacy  Name: [name]  MRN: [id number] DOB: [birth year]  Chief Complaint/ HPI  [name],  68 y.o. , female presents for their Initial CCM visit with the clinical pharmacist via telephone due to COVID-19 Pandemic.  PCP : Pearline Cables, MD  Their chronic conditions include: DM, HTN, HLD, CAD, Depression, GERD, Osteopenia, Obesity, Allergic Rhinitis, Pain  Office Visits: 11/16/18: Visit w/ Dr. Patsy Lager - Labs ordered (CMP, a1c, lipid/ldl direct, ma/cr, DEXA). Vascepa prescribed due to high TG.  Consult Visit: 03/01/19: Ophthalmology visit w/ Dr. Nile Riggs - No diabetic retinopathy OU  02/22/19: Neuro visit w/ Shawnie Dapper, NP - OSA w/ CPAP. Admitted non-adherence d/t not sleeping for 4 hrs at a time. Has to get up to urinate. Notices increased energy after using CPA. Pt encouraged to continue use. Overall CPAP compliance 80%. Time >4 hrs = 43%  Medications: Outpatient Encounter Medications as of 05/27/2019  Medication Sig Note  . acetaminophen (TYLENOL) 500 MG tablet Take 500 mg by mouth every 6 (six) hours as needed for mild pain or headache.   Marland Kitchen aspirin EC 81 MG EC tablet Take 1 tablet (81 mg total) by mouth daily.   . calcium carbonate (OS-CAL) 1250 (500 Ca) MG chewable tablet Chew 1 tablet by mouth daily.   . cetirizine (ZYRTEC) 10 MG chewable tablet Chew 10 mg by mouth daily as needed for allergies.   . cholecalciferol (VITAMIN D) 1000 UNITS tablet Take 1,000 Units by mouth daily.   . clopidogrel (PLAVIX) 75 MG tablet TAKE 1 TABLET BY MOUTH DAILY.   . cyclobenzaprine (FLEXERIL) 10 MG tablet TAKE 1 TABLET BY MOUTH TWICE DAILY AS NEEDED FOR MUSCLE SPASMS 05/27/2019: Last used 01/2019  . diazepam (VALIUM) 2 MG tablet TAKE 1 TABLET BY MOUTH EVERY 12 hours as needed for anxiety (Patient taking differently: Take 2 mg by mouth every 12 (twelve) hours as needed for muscle spasms. ) 05/27/2019: Last used 01/2019  . diclofenac sodium (VOLTAREN) 1 % GEL Apply 2 g  topically 4 (four) times daily.   . famotidine (PEPCID) 20 MG tablet TAKE 1 TABLET BY MOUTH 2 TIMES DAILY. (Patient taking differently: Take 20 mg by mouth daily. )   . FLUoxetine (PROZAC) 20 MG capsule Take 1 capsule (20 mg total) by mouth daily.   . fluticasone (FLONASE) 50 MCG/ACT nasal spray Place 1 spray into both nostrils daily as needed for allergies or rhinitis.   Bess Harvest Ethyl (VASCEPA) 1 g CAPS Take 2 capsules (2 g total) by mouth 2 (two) times daily.   . metFORMIN (GLUCOPHAGE-XR) 500 MG 24 hr tablet TAKE 1 TABLET BY MOUTH ONCE DAILY WITH BREAKFAST   . methocarbamol (ROBAXIN) 500 MG tablet Take 1 tablet (500 mg total) by mouth every 8 (eight) hours as needed for muscle spasms. 05/27/2019: Last used 01/2019  . metoprolol succinate (TOPROL-XL) 25 MG 24 hr tablet Take 0.5 tablets (12.5 mg total) by mouth daily.   . Multiple Vitamin (MULTIVITAMIN WITH MINERALS) TABS tablet Take 1 tablet by mouth daily.   . nitroGLYCERIN (NITROSTAT) 0.4 MG SL tablet PLACE 1 TABLET UNDER THE TONGUE EVERY 5 MINUTES FOR 3 DOSES AS NEEDED FOR CHEST PAIN. 05/27/2019: Hasn't had to use in past year  . simvastatin (ZOCOR) 40 MG tablet TAKE 1 TABLET BY MOUTH EVERY Syana Degraffenreid   . telmisartan (MICARDIS) 40 MG tablet TAKE 1 TABLET BY MOUTH EVERY MORNING   . [DISCONTINUED] risedronate (ACTONEL) 35 MG tablet Take 1 tablet (35 mg  total) by mouth every 7 (seven) days. with water on empty stomach, nothing by mouth or lie down for next 30 minutes.    No facility-administered encounter medications on file as of 05/27/2019.   Immunization History  Administered Date(s) Administered  . Fluad Quad(high Dose 65+) 11/16/2018  . Influenza Split 01/11/2016  . Influenza,inj,Quad PF,6+ Mos 12/01/2013, 01/18/2015  . Influenza-Unspecified 01/23/2018  . Pneumococcal Conjugate-13 01/18/2015  . Pneumococcal Polysaccharide-23 07/12/2010, 04/21/2017  . Td 11/16/2018  . Tdap 09/15/2008   Patient reports receiving both covid vaccines   Current  Diagnosis/Assessment:  Goals Addressed            This Visit's Progress   . A1c goal less than 7%      . Blood pressure goal <140/90      . Check blood pressure 3-4 times per week      . Check blood sugar 3-4 times per week and record readings      . Complete a1c at next office visit      . Complete lipid panel at next office visit       Should consider a stronger statin medication if LDL >70    . LDL goal less than 70      . Pharmacy Care Plan       CARE PLAN ENTRY  Current Barriers:  . Chronic Disease Management support, education, and care coordination needs related to DM, HTN, HLD, CAD, Depression, GERD, Osteopenia, Obesity, Allergic Rhinitis, Pain  Pharmacist Clinical Goal(s):  Marland Kitchen LDL goal <70 . BP goal <140/90 . A1c <7% . Complete lipid panel at next office visit . Complete a1c at next office visit  Interventions: . Comprehensive medication review performed. . Take simvastatin in the evening vs during the Hema Lanza . Take all second dose medications after dinner versus at bedtime to help you remember . Check blood pressure 3-4 times per week . Check blood sugar 3-4 times per week  Patient Self Care Activities:  . Patient verbalizes understanding of plan to follow as described above, Self administers medications as prescribed, Calls pharmacy for medication refills, and Calls provider office for new concerns or questions  Initial goal documentation     . Take second dose of medication after dinner instead of bedtime to help with adherence      . Take simvastatin in the evening versus in the morning       Simvastatin is one of the cholesterol medications that work better when taken at night.  Take after dinner instead of taking during the Quana Chamberlain.  Stronger (high intensity) cholesterol medications can be taken during the Tulip Meharg.  We may need to consider this if taking medication during the Aarna Mihalko is easier      Social Hx: 2 Children (oldest - son), 2nd is daughter. 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 2 great grand daughters  Diabetes   Recent Relevant Labs: Lab Results  Component Value Date/Time   HGBA1C 7.6 (H) 11/16/2018 10:10 AM   HGBA1C 7.0 (H) 02/05/2018 04:27 PM   MICROALBUR 0.8 11/16/2018 10:10 AM   MICROALBUR 0.2 09/29/2014 09:10 AM    Goal <7.5% or 7%  Checking BG: Only when she feels bad   Last checked BG Sunday or Monday:  Ate a scoop of soft serve ice cream and then went to [employer] and ordered chicken parmesean and made 3 meals of it. Blood sugar 2 days later was 220  Patient is currently controlled on the  following medications: metformin ER 500mg  daily  Denies any symptoms of hypoglycemia  Last diabetic Eye exam:  Lab Results  Component Value Date/Time   HMDIABEYEEXA No Retinopathy 07/27/2015 09:06 AM  Diabetic Eye Exam 03/01/19 at [facility] = No diabetic retinopathy OU  Last diabetic Foot exam: No results found for: HMDIABFOOTEX   We discussed: diet and exercise extensively  Plan -Check blood sugar 3-4 times per week and record readings -Complete a1c at next office visit -Continue current medications   Hypertension   BP today is:  Unable to assess due to phone visit  Office blood pressures are  BP Readings from Last 3 Encounters:  11/16/18 128/72  07/23/18 130/64  05/07/18 (!) 155/65    Patient has failed these meds in the past: hctz  Patient is currently controlled on the following medications: telmisartan 40mg  daily, metoprolol succinate 25mg  1/2 tab daily  Patient checks BP at home when feeling symptomatic  Patient home BP readings are ranging: 130s/70s  We discussed Proper blood pressure measurement technique  Plan -Check blood pressure 3-4 times per week -Continue current medications    Hyperlipidemia   Lipid Panel     Component Value Date/Time   CHOL 172 11/16/2018 1010   TRIG 269.0 (H) 11/16/2018 1010   HDL 32.90 (L) 11/16/2018 1010    CHOLHDL 5 11/16/2018 1010   VLDL 53.8 (H) 11/16/2018 1010   LDLCALC 69 12/12/2016 0324   LDLDIRECT 82.0 11/16/2018 1010     ASCVD 10-year risk: Hx of ASCVD  LDL <70  Patient has failed these meds in past: None noted Patient is currently uncontrolled on the following medications: simvastatin 40mg  daily, Vascepa 1g #2 BID (reports she forgets bedtime dose)  We discussed:  Taking 2nd dose meds at dinner vs bedtime since she forgets bedtime doses frequently.   Plan -Take simvastatin in the evening vs morning -Complete lipid panel at next office visit -Consider switching to high intensity statin (rosuvastatin) if LDL >70 -Continue current medications   CAD    Patient has failed these meds in past: None noted Patient is currently controlled on the following medications: aspirin 81mg  daily, clopidogrel 75mg  daily, nitrostat PRN  Plan -Continue current medications   Depression    Patient has failed these meds in past: None noted Patient is currently controlled on the following medications: fluoxetine 20mg  daily   Plan -Continue current medications   GERD    Patient has failed these meds in past: None noted Patient is currently controlled on the following medications: famotidine 20mg  BID  Plan -Continue current medications    Osteopenia   02/02/19: DEXA = Osteopenia T-Score Femur Neck Right = -2.0  Patient has failed these meds in past: None noted Patient is currently controlled on the following medications: risedronate 35mg  weekly on Sundays, vitamin D3 1000 units daily, calcium carbonate 500mg  daily  Risedronate changed to alendronate after visit due to cost efficacy with alendronate  Has fallen 4 times in last 3 years  Plan -Continue current medications   Obesity    Patient has failed these meds in past: None noted Patient is currently stable on the following medications: None  Wt: 210 (4lbs down since 11/16/18 office visit) Reduced the white foods  and increased meat and greens "Green and [employer] Purchased "Optivia" protein drinks and granola bars. Loves raw carrots  We discussed:  diet and exercise extensively  Plan -Continue control with diet and exercise  Allergic Rhinitis    Patient has  failed these meds in past: None noted Patient is currently controlled on the following medications: cetirizine 10mg  daily (uses PRN), flonase 45mcg/act daily  Plan -Continue current medications   Pain    Patient has failed these meds in past: None noted Patient is currently controlled on the following medications:   Last used tylenol (knee): few days ago; Takes 2 tabs once daily PRN Last used cyclobenzaprine (back): November puts her to bed for a Jennalynn Rivard or two Last used diazepam (back): November Last used methocarbamol (back): November can get back spasms with long car rides Last used voltaren: Uses daily out of the shower  Plan -Continue current medications

## 2019-05-28 ENCOUNTER — Encounter: Payer: Self-pay | Admitting: Family Medicine

## 2019-05-28 ENCOUNTER — Other Ambulatory Visit: Payer: Self-pay | Admitting: Family Medicine

## 2019-05-28 MED ORDER — ALENDRONATE SODIUM 70 MG PO TABS: 70.0000 mg | ORAL_TABLET | ORAL | 3 refills | Status: DC

## 2019-05-28 MED FILL — ALENDRONATE NA 70 MG TAB: 70 | 84 days supply | Qty: 12 | Fill #0

## 2019-06-01 MED FILL — METOPROLOL SUCCINATE ER 25: 25 | 90 days supply | Qty: 45 | Fill #2

## 2019-06-03 NOTE — Patient Instructions (Addendum)
Visit Information  Goals Addressed            This Visit's Progress   . A1c goal less than 7%      . Blood pressure goal <140/90      . Check blood pressure 3-4 times per week      . Check blood sugar 3-4 times per week and record readings      . Complete a1c at next office visit      . Complete lipid panel at next office visit       Should consider a stronger statin medication if LDL >70    . LDL goal less than 70      . Pharmacy Care Plan       CARE PLAN ENTRY  Current Barriers:  . Chronic Disease Management support, education, and care coordination needs related to DM, HTN, HLD, CAD, Depression, GERD, Osteopenia, Obesity, Allergic Rhinitis, Pain  Pharmacist Clinical Goal(s):  Marland Kitchen LDL goal <70 . BP goal <140/90 . A1c <7% . Complete lipid panel at next office visit . Complete a1c at next office visit  Interventions: . Comprehensive medication review performed. . Take simvastatin in the evening vs during the Trevon Strothers . Take all second dose medications after dinner versus at bedtime to help you remember . Check blood pressure 3-4 times per week . Check blood sugar 3-4 times per week  Patient Self Care Activities:  . Patient verbalizes understanding of plan to follow as described above, Self administers medications as prescribed, Calls pharmacy for medication refills, and Calls provider office for new concerns or questions  Initial goal documentation     . Take second dose of medication after dinner instead of bedtime to help with adherence      . Take simvastatin in the evening versus in the morning       Simvastatin is one of the cholesterol medications that work better when taken at night.  Take after dinner instead of taking during the Daleyza Gadomski.  Stronger (high intensity) cholesterol medications can be taken during the Ameenah Prosser.  We may need to consider this if taking medication during the Deziah Renwick is easier       Ms. Tramel was given information about Chronic Care Management services  today including:  1. CCM service includes personalized support from designated clinical staff supervised by her physician, including individualized plan of care and coordination with other care providers 2. 24/7 contact phone numbers for assistance for urgent and routine care needs. 3. Standard insurance, coinsurance, copays and deductibles apply for chronic care management only during months in which we provide at least 20 minutes of these services. Most insurances cover these services at 100%, however patients may be responsible for any copay, coinsurance and/or deductible if applicable. This service may help you avoid the need for more expensive face-to-face services. 4. Only one practitioner may furnish and bill the service in a calendar month. 5. The patient may stop CCM services at any time (effective at the end of the month) by phone call to the office staff.  Patient agreed to services and verbal consent obtained.   The patient verbalized understanding of instructions provided today and agreed to receive a mailed copy of patient instruction and/or educational materials. Telephone follow up appointment with pharmacy team member scheduled for:08/26/2019  Katrinka Blazing, PharmD Clinical Pharmacist [facility] at [facility] [phone]   Blood Pressure Record Sheet To take your blood pressure, you will need a blood pressure machine. You  can buy a blood pressure machine (blood pressure monitor) at your clinic, drug store, or online. When choosing one, consider:  An automatic monitor that has an arm cuff.  A cuff that wraps snugly around your upper arm. You should be able to fit only one finger between your arm and the cuff.  A device that stores blood pressure reading results.  Do not choose a monitor that measures your blood pressure from your wrist or finger. Follow your health care provider's instructions for how to take your blood pressure. To use this form:  Get  one reading in the morning (a.m.) before you take any medicines.  Get one reading in the evening (p.m.) before supper.  Take at least 2 readings with each blood pressure check. This makes sure the results are correct. Wait 1-2 minutes between measurements.  Write down the results in the spaces on this form.  Repeat this once a week, or as told by your health care provider.  Make a follow-up appointment with your health care provider to discuss the results. Blood pressure log Date: _______________________  a.m. _____________________(1st reading) _____________________(2nd reading)  p.m. _____________________(1st reading) _____________________(2nd reading) Date: _______________________  a.m. _____________________(1st reading) _____________________(2nd reading)  p.m. _____________________(1st reading) _____________________(2nd reading) Date: _______________________  a.m. _____________________(1st reading) _____________________(2nd reading)  p.m. _____________________(1st reading) _____________________(2nd reading) Date: _______________________  a.m. _____________________(1st reading) _____________________(2nd reading)  p.m. _____________________(1st reading) _____________________(2nd reading) Date: _______________________  a.m. _____________________(1st reading) _____________________(2nd reading)  p.m. _____________________(1st reading) _____________________(2nd reading) This information is not intended to replace advice given to you by your health care provider. Make sure you discuss any questions you have with your health care provider. Document Revised: 05/09/2017 Document Reviewed: 03/11/2017 Elsevier Patient Education  2020 [employer].   How to Take Your Blood Pressure You can take your blood pressure at home with a machine. You may need to check your blood pressure at home:  To check if you have high blood pressure (hypertension).  To check your blood pressure over  time.  To make sure your blood pressure medicine is working. Supplies needed: You will need a blood pressure machine, or monitor. You can buy one at a drugstore or online. When choosing one:  Choose one with an arm cuff.  Choose one that wraps around your upper arm. Only one finger should fit between your arm and the cuff.  Do not choose one that measures your blood pressure from your wrist or finger. Your doctor can suggest a monitor. How to prepare Avoid these things for 30 minutes before checking your blood pressure:  Drinking caffeine.  Drinking alcohol.  Eating.  Smoking.  Exercising. Five minutes before checking your blood pressure:  Pee.  Sit in a dining chair. Avoid sitting in a soft couch or armchair.  Be quiet. Do not talk. How to take your blood pressure Follow the instructions that came with your machine. If you have a digital blood pressure monitor, these may be the instructions: 1. Sit up straight. 2. Place your feet on the floor. Do not cross your ankles or legs. 3. Rest your left arm at the level of your heart. You may rest it on a table, desk, or chair. 4. Pull up your shirt sleeve. 5. Wrap the blood pressure cuff around the upper part of your left arm. The cuff should be 1 inch (2.5 cm) above your elbow. It is best to wrap the cuff around bare skin. 6. Fit the cuff snugly around your arm. You should be able  to place only one finger between the cuff and your arm. 7. Put the cord inside the groove of your elbow. 8. Press the power button. 9. Sit quietly while the cuff fills with air and loses air. 10. Write down the numbers on the screen. 11. Wait 2-3 minutes and then repeat steps 1-10. What do the numbers mean? Two numbers make up your blood pressure. The first number is called systolic pressure. The second is called diastolic pressure. An example of a blood pressure reading is "120 over 80" (or 120/80). If you are an adult and do not have a medical  condition, use this guide to find out if your blood pressure is normal: Normal  First number: below 120.  Second number: below 80. Elevated  First number: 120-129.  Second number: below 80. Hypertension stage 1  First number: 130-139.  Second number: 80-89. Hypertension stage 2  First number: 140 or above.  Second number: 13 or above. Your blood pressure is above normal even if only the top or bottom number is above normal. Follow these instructions at home:  Check your blood pressure as often as your doctor tells you to.  Take your monitor to your next doctor's appointment. Your doctor will: ? Make sure you are using it correctly. ? Make sure it is working right.  Make sure you understand what your blood pressure numbers should be.  Tell your doctor if your medicines are causing side effects. Contact a doctor if:  Your blood pressure keeps being high. Get help right away if:  Your first blood pressure number is higher than 180.  Your second blood pressure number is higher than 120. This information is not intended to replace advice given to you by your health care provider. Make sure you discuss any questions you have with your health care provider. Document Revised: 02/21/2017 Document Reviewed: 08/18/2015 Elsevier Patient Education  2020 [employer].

## 2019-06-20 MED FILL — metFORMIN HCL ER 500 MG TB2: 500 | 90 days supply | Qty: 90 | Fill #1

## 2019-06-20 MED FILL — TELMISARTAN 40 MG TABLET: 40 | 30 days supply | Qty: 30 | Fill #10

## 2019-06-24 ENCOUNTER — Ambulatory Visit: Payer: Self-pay | Admitting: Pharmacist

## 2019-06-24 DIAGNOSIS — E785 Hyperlipidemia, unspecified: Secondary | ICD-10-CM

## 2019-06-24 DIAGNOSIS — I10 Essential (primary) hypertension: Secondary | ICD-10-CM

## 2019-06-24 DIAGNOSIS — E118 Type 2 diabetes mellitus with unspecified complications: Secondary | ICD-10-CM

## 2019-06-24 NOTE — Chronic Care Management (AMB) (Signed)
Called patient for HTN and DM Assessment.  Patient has been checking her BG, but not her BP (she has not retrieved her cuff from her grand daughter's house).   She states she has been going through a lot with her husband who was found to have a perforation of his intestine. He also suffered from balance issues, but stroke was ruled out.   Patient now states she has been vomiting all Alysson Geist with chills. She believes she got a virus at her grand daughters house.  She states her BG readings are as follows: Lowest: 113 Highest: 170 Avg: 140  She also reports that she has starting taking simvastatin in the evening. She still forgets a few nights per week, but is doing better.  Will continue current regimen and reassess at follow up scheduled for 08/26/19  Continue current regimen Continue checking BG Start checking BG Follow up on 08/26/19

## 2019-06-27 MED FILL — CLOPIDOGREL 75 MG TABLET: 75 | 90 days supply | Qty: 90 | Fill #1

## 2019-06-30 NOTE — Patient Instructions (Addendum)
Good to see you again, I will be in touch with your labs as soon as possible.  Assuming everything looks reasonable, we can visit in 6 months Please consider having the shingles vaccine at your pharmacy at your convenience Ovid job with your diet and weight loss so far!

## 2019-06-30 NOTE — Progress Notes (Addendum)
[facility] [address], [place], [place] [address] [phone]  Date:  07/01/2019   Name:  [name]   DOB:  [birth year]   MRN:  [id number]  PCP:  Darreld Mclean, MD    Chief Complaint: Diabetes (a1c check)   History of Present Illness:  [name] is a 69 y.o. very pleasant female patient who presents with the following:  Patient with history of hypertension, CAD, diabetes, sleep apnea, obesity, osteopenia with elevated fracture risk Our last visit was in August for physical  She saw [name], neurology nurse practitioner November to discuss her sleep apnea Her cardiologist is Dr. Debara Pickett  A1c/routine labs is due; she is fasting today Foot exam due  Fosamax-treatment started fall 2020; tolerating well  Aspirin Plavix Valium as needed Fluoxetine Vascepa- she is taking this, causes some gas but she is tolerating Metformin Toprol-XL Zocor Telmisartan  She started a new diet about 8 weeks ago- she is down 10 lbs from a year ago  She is trying to be more active Overall she is pleased with her progress  Unfortunately her husband [name] has been ill recently, he had severe diverticulitis and is now struggling with vertigo Wt Readings from Last 3 Encounters:  07/01/19 208 lb (94.3 kg)  11/16/18 214 lb (97.1 kg)  07/23/18 217 lb (98.4 kg)     Patient Active Problem List   Diagnosis Date Noted  . Obstructive sleep apnea treated with continuous positive airway pressure (CPAP) 05/07/2018  . Chest pain 01/31/2018  . Morbid obesity ([place]) 11/03/2017  . Coronary artery disease involving native coronary artery of native heart with unstable angina pectoris ([place]) 11/03/2017  . Insomnia 11/03/2017  . Inadequate sleep hygiene 11/03/2017  . Anxiety 07/21/2017  . Dizziness 10/02/2016  . Right patella fracture 08/29/2016  . Acquired foot deformity, left 07/18/2016  . Osteopenia 11/10/2015  . HNP (herniated  nucleus pulposus), lumbar 10/12/2014  . Spinal stenosis at L4-L5 level 10/12/2014  . Iron deficiency anemia 07/29/2014  . DM (diabetes mellitus) with complications ([place]) 35/00/9381  . Environmental and seasonal allergies 04/28/2014  . Spinal stenosis, lumbar region, with neurogenic claudication 01/06/2013  . Obesity, morbid, BMI 40.0-49.9 ([place]) 10/20/2012  . Chest pain with moderate risk of acute coronary syndrome 10/02/2012  . CAD S/P percutaneous coronary angioplasty   . Hyperlipidemia with target LDL less than 70   . Essential hypertension 04/16/2012    Past Medical History:  Diagnosis Date  . Anemia   . Arthritis   . Bronchitis    hx of   . CAD (coronary artery disease)    a. s/p DES to LAD in 2010 with patent stent by cath in 2014 b. low-risk NST in 11/2016  . Cataracts, bilateral   . Diabetes mellitus without complication ([place])   . Family history of adverse reaction to anesthesia    pts mother had difficulty awakening   . GERD (gastroesophageal reflux disease)   . Hyperlipidemia LDL goal < 70   . Hypertension   . Lumbar back pain   . Numbness    left leg and foot   . Plantar fascia rupture    Left Foot  . Sleep apnea    uses CPAP     Past Surgical History:  Procedure Laterality Date  . ABDOMINAL HYSTERECTOMY    . BACK SURGERY    . BREAST CYST ASPIRATION  1995  . CAROTID STENT  2009   pt denies   . CORONARY ANGIOPLASTY WITH STENT PLACEMENT  2010and 10-02-2012   Stent DES, Xience to prox. LAD  . DOPPLER ECHOCARDIOGRAPHY  08/01/2009   EF=>55%,LV normal  . LEFT HEART CATHETERIZATION WITH CORONARY ANGIOGRAM N/A 10/02/2012   Procedure: LEFT HEART CATHETERIZATION WITH CORONARY ANGIOGRAM;  Surgeon: Runell Gess, MD;  Location[facility] CATH LAB;  Service: Cardiovascular;  Laterality: N/A;  . lower arterial duplex  06/20/10   abi's normal,rgt 0.98,lft 1.06;bilateral PVRs normal  . LUMBAR LAMINECTOMY/DECOMPRESSION MICRODISCECTOMY Left 01/06/2013   Procedure: MICRO LUMBAR  DECOMPRESSION L4-5 AND L5-S1;  Surgeon: Javier Docker, MD;  Location: [facility] ORS;  Service: Orthopedics;  Laterality: Left;  . LUMBAR LAMINECTOMY/DECOMPRESSION MICRODISCECTOMY Left 10/12/2014   Procedure: REVISION MICRO LUMBAR/DECOMPRESSION L4-5 LEFT ;  Surgeon: Jene Every, MD;  Location: [facility] ORS;  Service: Orthopedics;  Laterality: Left;  . NM MYOCAR PERF WALL MOTION  09/22/2008   lexiscan-EF 83%; glogal LV systolic fx is norm. ,evidence of mild ischemia basal anterior,midanterior and apical lateral region(s).   . ORIF PATELLA Right 08/29/2016   Procedure: OPEN REDUCTION INTERNAL (ORIF) FIXATION RIGHT PATELLA;  Surgeon: Samson Frederic, MD;  Location: [facility] ORS;  Service: Orthopedics;  Laterality: Right;  Adductor Block  . TUBAL LIGATION    . TYMPANOPLASTY Bilateral   . UVULOPALATOPHARYNGOPLASTY     pt denies     Social History   Tobacco Use  . Smoking status: Never Smoker  . Smokeless tobacco: Never Used  Substance Use Topics  . Alcohol use: Yes    Comment: occasional, 1 a month wine  . Drug use: No    Family History  Problem Relation Age of Onset  . Coronary artery disease Mother   . Rheum arthritis Mother   . Dementia Mother   . Heart attack Father   . Hypertension Father   . Hyperlipidemia Father   . Other Father        MVA  . Hypertension Brother   . Cancer Brother   . Heart disease Brother   . Cancer Paternal Grandmother        stomach  . Diabetes Paternal Grandfather   . Breast cancer Neg Hx     Allergies  Allergen Reactions  . Niacin And Related Other (See Comments)    [place] and skin flushed, mouth tingling    Medication list has been reviewed and updated.  Current Outpatient Medications on File Prior to Visit  Medication Sig Dispense Refill  . acetaminophen (TYLENOL) 500 MG tablet Take 500 mg by mouth every 6 (six) hours as needed for mild pain or headache.    . alendronate (FOSAMAX) 70 MG tablet Take 1 tablet (70 mg total) by mouth every 7 (seven) days.  Take with a full glass of water on an empty stomach. 12 tablet 3  . aspirin EC 81 MG EC tablet Take 1 tablet (81 mg total) by mouth daily. 30 tablet 1  . calcium carbonate (OS-CAL) 1250 (500 Ca) MG chewable tablet Chew 1 tablet by mouth daily.    . cetirizine (ZYRTEC) 10 MG chewable tablet Chew 10 mg by mouth daily as needed for allergies.    . cholecalciferol (VITAMIN D) 1000 UNITS tablet Take 1,000 Units by mouth daily.    . clopidogrel (PLAVIX) 75 MG tablet TAKE 1 TABLET BY MOUTH DAILY. 90 tablet 3  . cyclobenzaprine (FLEXERIL) 10 MG tablet TAKE 1 TABLET BY MOUTH TWICE DAILY AS NEEDED FOR MUSCLE SPASMS 30 tablet 2  . diazepam (  VALIUM) 2 MG tablet TAKE 1 TABLET BY MOUTH EVERY 12 hours as needed for anxiety (Patient taking differently: Take 2 mg by mouth every 12 (twelve) hours as needed for muscle spasms. ) 40 tablet 1  . diclofenac sodium (VOLTAREN) 1 % GEL Apply 2 g topically 4 (four) times daily.    . famotidine (PEPCID) 20 MG tablet TAKE 1 TABLET BY MOUTH 2 TIMES DAILY. (Patient taking differently: Take 20 mg by mouth daily. ) 180 tablet 1  . FLUoxetine (PROZAC) 20 MG capsule Take 1 capsule (20 mg total) by mouth daily. 90 capsule 3  . fluticasone (FLONASE) 50 MCG/ACT nasal spray Place 1 spray into both nostrils daily as needed for allergies or rhinitis.    Bess [place]vest Ethyl (VASCEPA) 1 g CAPS Take 2 capsules (2 g total) by mouth 2 (two) times daily. 120 capsule 11  . metFORMIN (GLUCOPHAGE-XR) 500 MG 24 hr tablet TAKE 1 TABLET BY MOUTH ONCE DAILY WITH BREAKFAST 90 tablet 1  . methocarbamol (ROBAXIN) 500 MG tablet Take 1 tablet (500 mg total) by mouth every 8 (eight) hours as needed for muscle spasms. 40 tablet 0  . metoprolol succinate (TOPROL-XL) 25 MG 24 hr tablet Take 0.5 tablets (12.5 mg total) by mouth daily. 45 tablet 3  . Multiple Vitamin (MULTIVITAMIN WITH MINERALS) TABS tablet Take 1 tablet by mouth daily.    . nitroGLYCERIN (NITROSTAT) 0.4 MG SL tablet PLACE 1 TABLET UNDER THE TONGUE  EVERY 5 MINUTES FOR 3 DOSES AS NEEDED FOR CHEST PAIN. 25 tablet 0  . simvastatin (ZOCOR) 40 MG tablet TAKE 1 TABLET BY MOUTH EVERY DAY 90 tablet 0  . telmisartan (MICARDIS) 40 MG tablet TAKE 1 TABLET BY MOUTH EVERY MORNING 90 tablet 3   No current facility-administered medications on file prior to visit.    Review of Systems:  As per HPI- otherwise negative.   Physical Examination: Vitals:   07/01/19 0857  BP: 140/80  Pulse: (!) 57  Resp: 18  Temp: (!) 97.1 F (36.2 C)  SpO2: 96%   Vitals:   07/01/19 0857  Weight: 208 lb (94.3 kg)  Height: 5\' 1"  (1.549 m)   Body mass index is 39.3 kg/m. Ideal Body Weight: Weight in (lb) to have BMI = 25: 132  GEN: no acute distress.  Obese but has lost weight, looks well HEENT: Atraumatic, Normocephalic.   Bilateral TM wnl, oropharynx normal.  PEERL,EOMI.   Ears and Nose: No external deformity. CV: RRR, No M/G/R. No JVD. No thrill. No extra heart sounds. PULM: CTA B, no wheezes, crackles, rhonchi. No retractions. No resp. distress. No accessory muscle use. ABD: S, NT, ND, +BS. No rebound. No HSM. EXTR: No c/c/e PSYCH: Normally interactive. Conversant.  Foot exam done today   Assessment and Plan: DM (diabetes mellitus) with complications (HCC) - Plan: Comprehensive metabolic panel, Hemoglobin A1c  Essential hypertension - Plan: CBC, Comprehensive metabolic panel  Hyperlipidemia with target LDL less than 70 - Plan: Lipid panel  Obstructive sleep apnea treated with continuous positive airway pressure (CPAP)  Osteopenia, unspecified location  CAD S/P percutaneous coronary angioplasty  Morbid obesity (HCC)  Screening for thyroid disorder - Plan: TSH  Here today for follow-up visit She has lost about 10 pounds, congratulated her and encouraged continued efforts.  We hope that her labs will reflect some improvement She is compliant with her CPAP Blood pressures under good control Will be in touch with her pending labs, further  medication adjustments may be necessary  This visit  occurred during the SARS-CoV-2 public health emergency.  Safety protocols were in place, including screening questions prior to the visit, additional usage of staff PPE, and extensive cleaning of exam room while observing appropriate contact time as indicated for disinfecting solutions.   Moderate medical decision making today Signed Abbe Amsterdam, MD  Addendum 4/9, received her labs as below  Message to patient Results for orders placed or performed in visit on 07/01/19  CBC  Result Value Ref Range   WBC 9.6 4.0 - 10.5 K/uL   RBC 4.70 3.87 - 5.11 Mil/uL   Platelets 328.0 150.0 - 400.0 K/uL   Hemoglobin 12.0 12.0 - 15.0 g/dL   HCT 10.2 58.5 - 27.7 %   MCV 80.0 78.0 - 100.0 fl   MCHC 31.8 30.0 - 36.0 g/dL   RDW 82.4 23.5 - 36.1 %  Comprehensive metabolic panel  Result Value Ref Range   Sodium 139 135 - 145 mEq/L   Potassium 4.1 3.5 - 5.1 mEq/L   Chloride 105 96 - 112 mEq/L   CO2 25 19 - 32 mEq/L   Glucose, Bld 127 (H) 70 - 99 mg/dL   BUN 17 6 - 23 mg/dL   Creatinine, Ser 4.43 0.40 - 1.20 mg/dL   Total Bilirubin 0.5 0.2 - 1.2 mg/dL   Alkaline Phosphatase 87 39 - 117 U/L   AST 15 0 - 37 U/L   ALT 24 0 - 35 U/L   Total Protein 6.3 6.0 - 8.3 g/dL   Albumin 4.0 3.5 - 5.2 g/dL   GFR 15.40 >08.67 mL/min   Calcium 9.3 8.4 - 10.5 mg/dL  Hemoglobin Y1P  Result Value Ref Range   Hgb A1c MFr Bld 6.8 (H) 4.6 - 6.5 %  Lipid panel  Result Value Ref Range   Cholesterol 116 0 - 200 mg/dL   Triglycerides 509.3 (H) 0.0 - 149.0 mg/dL   HDL 26.71 (L) >24.58 mg/dL   VLDL 09.9 0.0 - 83.3 mg/dL   LDL Cholesterol 53 0 - 99 mg/dL   Total CHOL/HDL Ratio 4    NonHDL 83.64   TSH  Result Value Ref Range   TSH 2.35 0.35 - 4.50 uIU/mL

## 2019-07-01 ENCOUNTER — Encounter: Payer: Self-pay | Admitting: Family Medicine

## 2019-07-01 ENCOUNTER — Ambulatory Visit (INDEPENDENT_AMBULATORY_CARE_PROVIDER_SITE_OTHER): Payer: PPO | Admitting: Family Medicine

## 2019-07-01 ENCOUNTER — Other Ambulatory Visit: Payer: Self-pay

## 2019-07-01 VITALS — BP 140/80 | HR 57 | Temp 97.1°F | Resp 18 | Ht 61.0 in | Wt 208.0 lb

## 2019-07-01 DIAGNOSIS — Z1329 Encounter for screening for other suspected endocrine disorder: Secondary | ICD-10-CM | POA: Diagnosis not present

## 2019-07-01 DIAGNOSIS — M8580 Other specified disorders of bone density and structure, unspecified site: Secondary | ICD-10-CM | POA: Diagnosis not present

## 2019-07-01 DIAGNOSIS — I10 Essential (primary) hypertension: Secondary | ICD-10-CM

## 2019-07-01 DIAGNOSIS — E785 Hyperlipidemia, unspecified: Secondary | ICD-10-CM | POA: Diagnosis not present

## 2019-07-01 DIAGNOSIS — M65342 Trigger finger, left ring finger: Secondary | ICD-10-CM | POA: Diagnosis not present

## 2019-07-01 DIAGNOSIS — Z9861 Coronary angioplasty status: Secondary | ICD-10-CM | POA: Diagnosis not present

## 2019-07-01 DIAGNOSIS — Z9989 Dependence on other enabling machines and devices: Secondary | ICD-10-CM | POA: Diagnosis not present

## 2019-07-01 DIAGNOSIS — M79642 Pain in left hand: Secondary | ICD-10-CM | POA: Diagnosis not present

## 2019-07-01 DIAGNOSIS — E118 Type 2 diabetes mellitus with unspecified complications: Secondary | ICD-10-CM

## 2019-07-01 DIAGNOSIS — I2510 Atherosclerotic heart disease of native coronary artery without angina pectoris: Secondary | ICD-10-CM | POA: Diagnosis not present

## 2019-07-01 DIAGNOSIS — G4733 Obstructive sleep apnea (adult) (pediatric): Secondary | ICD-10-CM | POA: Diagnosis not present

## 2019-07-01 LAB — COMPREHENSIVE METABOLIC PANEL
ALT: 24 U/L (ref 0–35)
AST: 15 U/L (ref 0–37)
Albumin: 4 g/dL (ref 3.5–5.2)
Alkaline Phosphatase: 87 U/L (ref 39–117)
BUN: 17 mg/dL (ref 6–23)
CO2: 25 mEq/L (ref 19–32)
Calcium: 9.3 mg/dL (ref 8.4–10.5)
Chloride: 105 mEq/L (ref 96–112)
Creatinine, Ser: 0.75 mg/dL (ref 0.40–1.20)
GFR: 76.46 mL/min (ref >60.00)
Glucose, Bld: 127 mg/dL — ABNORMAL HIGH (ref 70–99)
Potassium: 4.1 mEq/L (ref 3.5–5.1)
Sodium: 139 mEq/L (ref 135–145)
Total Bilirubin: 0.5 mg/dL (ref 0.2–1.2)
Total Protein: 6.3 g/dL (ref 6.0–8.3)

## 2019-07-01 LAB — LIPID PANEL
Cholesterol: 116 mg/dL (ref 0–200)
HDL: 31.9 mg/dL — ABNORMAL LOW (ref >39.00)
LDL Cholesterol: 53 mg/dL (ref 0–99)
NonHDL: 83.64
Total CHOL/HDL Ratio: 4
Triglycerides: 154 mg/dL — ABNORMAL HIGH (ref 0.0–149.0)
VLDL: 30.8 mg/dL (ref 0.0–40.0)

## 2019-07-01 LAB — CBC
HCT: 37.6 % (ref 36.0–46.0)
Hemoglobin: 12 g/dL (ref 12.0–15.0)
MCHC: 31.8 g/dL (ref 30.0–36.0)
MCV: 80 fl (ref 78.0–100.0)
Platelets: 328 10*3/uL (ref 150.0–400.0)
RBC: 4.7 Mil/uL (ref 3.87–5.11)
RDW: 14.1 % (ref 11.5–15.5)
WBC: 9.6 10*3/uL (ref 4.0–10.5)

## 2019-07-01 LAB — TSH: TSH: 2.35 u[IU]/mL (ref 0.35–4.50)

## 2019-07-01 LAB — HEMOGLOBIN A1C: Hgb A1c MFr Bld: 6.8 % — ABNORMAL HIGH (ref 4.6–6.5)

## 2019-07-02 ENCOUNTER — Encounter: Payer: Self-pay | Admitting: Family Medicine

## 2019-07-03 MED FILL — VASCEPA 1 GM CAPSULE: 1 | 30 days supply | Qty: 120 | Fill #2

## 2019-07-19 MED FILL — CHLORHEXIDINE 0.12% RINSE: 0.12 | 14 days supply | Qty: 473 | Fill #0

## 2019-07-19 MED FILL — HYDROCODON-APAP 5-325: 5-325 | 1 days supply | Qty: 5 | Fill #0

## 2019-07-19 MED FILL — AMOXICILLIN 500 MG CAPSULE: 500 | 7 days supply | Qty: 21 | Fill #0

## 2019-07-26 MED FILL — TELMISARTAN 40 MG TABS: 40 | 30 days supply | Qty: 30 | Fill #11

## 2019-08-19 ENCOUNTER — Other Ambulatory Visit: Payer: Self-pay | Admitting: Family Medicine

## 2019-08-20 MED FILL — METHOCARBAMOL 500 MG TABS: 500 | 13 days supply | Qty: 40 | Fill #0

## 2019-08-22 MED FILL — FLUoxetine HCL 20 MG CAPS: 20 | 90 days supply | Qty: 90 | Fill #2

## 2019-08-24 ENCOUNTER — Telehealth: Payer: PPO | Admitting: Family Medicine

## 2019-08-24 NOTE — Progress Notes (Deleted)
PATIENT: [name] DOB: 16-Aug-1950  REASON FOR VISIT: follow up HISTORY FROM: patient  Virtual Visit via Telephone Note  I connected with Caidyn Blossom Kok on 08/24/19 at  9:00 AM EDT by telephone and verified that I am speaking with the correct person using two identifiers.   I discussed the limitations, risks, security and privacy concerns of performing an evaluation and management service by telephone and the availability of in person appointments. I also discussed with the patient that there may be a patient responsible charge related to this service. The patient expressed understanding and agreed to proceed.   History of Present Illness:  08/24/19 [name] is a 69 y.o. female here today for follow up for OSA on CPAP. Sleep study in 11/2017 showed obstructive sleep apnea with strong REM accentuation. REM AHI 51. Hypoxemia noted with lowest O2 of 80%. Severe PLMD noted with 3 arousals per hour. She has continued to struggle with compliance.   Compliance report dated 05/21/2019 through 08/18/2019 reveals that she used CPAP 46 of the past 90 days for compliance of 51%.  She used CPAP greater than 4 hours 28 of the last 90 days for compliance of 31%.  Average usage on days used was 4 hours and 7 minutes.  Residual AHI was 0.9 on 6 to 10 cm of water and an EPR of 1.  There was no significant leak noted.   History (copied from my note on 02/12/2019)  [name] is a 69 y.o. female here today for follow up of OSA on CPAP.  She reports that she is adjusting well to CPAP therapy.  She reports that she continues to work on daily compliance and minimum of 4-hour usage.  She feels that her main concern is that she does not typically sleep for 4 hours at a time.  She admits to having a bad habit of scrolling on Facebook and then falling asleep without placing CPAP therapy.  She also reports waking multiple times at night to urinate.  Sometimes she replaces CPAP therapy and sometimes she does not.   When she is using CPAP she notes significant improvement in energy levels the following day.  She is very motivated to continue using CPAP.  Compliance report dated 01/18/2019 through 02/16/2019 reveals that she has used CPAP 24 of the last 30 days for compliance of 80%.  13 days she used CPAP greater than 4 hours for compliance of 43%.  Average usage was 3 hours and 46 minutes.  Residual AHI was 1.0 on 6 to 10 cm of water and an EPR of 1.  There was no significant leak noted.  History (copied from my note on 11/17/2018)  [name] a 69 y.o.femalehere today for follow up of OSA on CPAP. She is doing better with compliance but admits that she struggles to get 4 hours each night. She reports being up and down at night and does not replace therapy once she wakes.Compliance report dated 10/17/2018 through 11/15/2018 reveals that she has used CPAP 27 of the last 30 days for compliance of 90%. 17 of those days she used CPAP greater than 4 hours for compliance of 57%. On average she used CPAP 4 hours and 17 minutes. AHI was 1 on the AR-1. There was no significant leak.   History (copied from [employer] note on 05/07/2018)  UPDATE2/13/2020CMMs. Petraitis, 69 year old female returns for follow-up with a history of obstructive sleep apnea here for initial CPAP compliance. She continues  to complain with fatigue with the machine. She is noted to have a leak. Compliance data dated 04/06/2018-to 05/05/2018 shows compliance greater than 4 hours at 93%. Average usage 7 hours 16 minutes. Set pressure 6 to 10 cm.EPR level 1 leak 95th percentile 24.7 max 43.5. AHI 1.6 ESS 12. 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 Andrey Spearman for sleep study the date of the study was 13 February 2014 she had a endorsed coronary artery disease, obesity, asthma,  anemia, diabetes mellitus with nocturia, neuropathy, hypertension, GERD, hyperlipidemia and she has been followed for memory loss has had extensive work-up with Dr. Adria Devon. Her husband had witnessed loud snoring and she also struggled with insomnia and frequent awakenings at night. At the time she had a BMI of 42.1 Epworth Sleepiness Scale was endorsed at 10 point. Patient was diagnosed with mild apnea at an AHI of 11.2, which exacerbated during REM sleep to 44.7, there was minor accentuation during supine sleep versus nonsupine sleep. 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  She was placed on CPAP and reports that she used the machine compliantly-advised that she had to use it 4 hours each night, but then received a phone call from the DME stating that she was no longer in compliance and she had to return her CPAP machine this was about 2-1/2 years ago. Her husband interjjected that had not used it Due to nocturia- "the up and down and being tethered".   Sleep habits are as follows: Dinner time is usually 6 PM, and she may use her stationary bike some afternoons. Her bed time is 10 PM or ater, not later than midnight. Her bedroom is cool, quiet and dark- uses a fan. She is usually asleep by 12.00 but she sleeps in the same bed with a her great- granddaughter ( 4) , and is frequently woken.  She rises at 3.30 and 6.30 for nocturia- every 3 hours . She will sleep again until 10 AM. She naps by 2 pm- her sleep habits have deteriorated. She snores and she still has apnea.    Observations/Objective:  Generalized: Well developed, in no acute distress  Mentation: Alert oriented to time, place, history taking. Follows all commands speech and language fluent   Assessment and Plan:  69 y.o. year old female  has a past medical history of Anemia, Arthritis, Bronchitis, CAD (coronary  artery disease), Cataracts, bilateral, Diabetes mellitus without complication ([place]), Family history of adverse reaction to anesthesia, GERD (gastroesophageal reflux disease), Hyperlipidemia LDL goal < 70, Hypertension, Lumbar back pain, Numbness, Plantar fascia rupture, and Sleep apnea. here with  No diagnosis found.  No orders of the defined types were placed in this encounter.   No orders of the defined types were placed in this encounter.    Follow Up Instructions:  I discussed the assessment and treatment plan with the patient. The patient was provided an opportunity to ask questions and all were answered. The patient agreed with the plan and demonstrated an understanding of the instructions.   The patient was advised to call back or seek an in-person evaluation if the symptoms worsen or if the condition fails to improve as anticipated.  I provided *** minutes of non-face-to-face time during this encounter.  Debbora Presto, NP

## 2019-08-25 NOTE — Chronic Care Management (AMB) (Signed)
Chronic Care Management Pharmacy  Name: [name]  MRN: [id number] DOB: [birth year]  Chief Complaint/ HPI  [name],  69 y.o. , female presents for their Follow-Up CCM visit with the clinical pharmacist via telephone due to COVID-19 Pandemic.  PCP : Pearline Cables, MD  Their chronic conditions include: DM, HTN, HLD, CAD, Depression, GERD, Osteopenia, Obesity, Allergic Rhinitis, Pain  Office Visits: 07/01/19: Visit w/ Dr. Patsy Lager - Labs ordered (cmp, a1c, lipid, cbc, tsh). No med changes noted  06/24/19: CCM Call w/ Dannielle Karvonen Razan Siler, PharmD - Pt not checking BP regularly due to concerns with her husband. Checked her BGs that are 140 on average. She has started taking simvastatin in the evening rather than morning as discussed at [facility] visit on 05/27/19.  Consult Visit: 07/01/19: Orthopedic Surgery visit w/ Dr. Amanda Pea - Trigger finger of left hand  Medications: Outpatient Encounter Medications as of 08/26/2019  Medication Sig Note  . acetaminophen (TYLENOL) 500 MG tablet Take 500 mg by mouth every 6 (six) hours as needed for mild pain or headache.   . alendronate (FOSAMAX) 70 MG tablet Take 1 tablet (70 mg total) by mouth every 7 (seven) days. Take with a full glass of water on an empty stomach.   Marland Kitchen aspirin EC 81 MG EC tablet Take 1 tablet (81 mg total) by mouth daily.   . calcium carbonate (OS-CAL) 1250 (500 Ca) MG chewable tablet Chew 1 tablet by mouth daily.   . cetirizine (ZYRTEC) 10 MG chewable tablet Chew 10 mg by mouth daily as needed for allergies.   . cholecalciferol (VITAMIN D) 1000 UNITS tablet Take 1,000 Units by mouth daily.   . clopidogrel (PLAVIX) 75 MG tablet TAKE 1 TABLET BY MOUTH DAILY.   . cyclobenzaprine (FLEXERIL) 10 MG tablet TAKE 1 TABLET BY MOUTH TWICE DAILY AS NEEDED FOR MUSCLE SPASMS 05/27/2019: Last used 01/2019  . diazepam (VALIUM) 2 MG tablet TAKE 1 TABLET BY MOUTH EVERY 12 hours as needed for anxiety (Patient taking differently: Take 2 mg by mouth every 12  (twelve) hours as needed for muscle spasms. ) 05/27/2019: Last used 01/2019  . diclofenac sodium (VOLTAREN) 1 % GEL Apply 2 g topically 4 (four) times daily.   . famotidine (PEPCID) 20 MG tablet TAKE 1 TABLET BY MOUTH 2 TIMES DAILY. (Patient taking differently: Take 20 mg by mouth daily. )   . FLUoxetine (PROZAC) 20 MG capsule Take 1 capsule (20 mg total) by mouth daily.   . fluticasone (FLONASE) 50 MCG/ACT nasal spray Place 1 spray into both nostrils daily as needed for allergies or rhinitis.   Bess Harvest Ethyl (VASCEPA) 1 g CAPS Take 2 capsules (2 g total) by mouth 2 (two) times daily.   . metFORMIN (GLUCOPHAGE-XR) 500 MG 24 hr tablet TAKE 1 TABLET BY MOUTH ONCE DAILY WITH BREAKFAST   . methocarbamol (ROBAXIN) 500 MG tablet TAKE 1 TABLET (500 MG TOTAL) BY MOUTH EVERY 8 (EIGHT) HOURS AS NEEDED FOR MUSCLE SPASMS.   . metoprolol succinate (TOPROL-XL) 25 MG 24 hr tablet Take 0.5 tablets (12.5 mg total) by mouth daily.   . Multiple Vitamin (MULTIVITAMIN WITH MINERALS) TABS tablet Take 1 tablet by mouth daily.   . nitroGLYCERIN (NITROSTAT) 0.4 MG SL tablet PLACE 1 TABLET UNDER THE TONGUE EVERY 5 MINUTES FOR 3 DOSES AS NEEDED FOR CHEST PAIN. 05/27/2019: Hasn't had to use in past year  . simvastatin (ZOCOR) 40 MG tablet TAKE 1 TABLET BY MOUTH EVERY Juniper Cobey   . telmisartan (MICARDIS)  40 MG tablet TAKE 1 TABLET BY MOUTH EVERY MORNING    No facility-administered encounter medications on file as of 08/26/2019.   Immunization History  Administered Date(s) Administered  . Fluad Quad(high Dose 65+) 11/16/2018  . Influenza Split 01/11/2016  . Influenza,inj,Quad PF,6+ Mos 12/01/2013, 01/18/2015  . Influenza-Unspecified 01/23/2018  . PFIZER SARS-COV-2 Vaccination 04/20/2019, 05/13/2019  . Pneumococcal Conjugate-13 01/18/2015  . Pneumococcal Polysaccharide-23 07/12/2010, 04/21/2017  . Td 11/16/2018  . Tdap 09/15/2008   Patient reports receiving both covid vaccines  SDOH Screenings   Alcohol Screen:   . Last  Alcohol Screening Score (AUDIT):   Depression (PHQ2-9): Low Risk   . PHQ-2 Score: 0  Financial Resource Strain:   . Difficulty of Paying Living Expenses:   Food Insecurity:   . Worried About Programme researcher, broadcasting/film/video in the Last Year:   . [employer] of Food in the Last Year:   Housing:   . Last Housing Risk Score:   Physical Activity:   . Days of Exercise per Week:   . Minutes of Exercise per Session:   Social Connections:   . Frequency of Communication with Friends and Family:   . Frequency of Social Gatherings with Friends and Family:   . Attends Religious Services:   . Active Member of Clubs or Organizations:   . Attends Banker Meetings:   Marland Kitchen Marital Status:   Stress:   . Feeling of Stress :   Tobacco Use: Low Risk   . Smoking Tobacco Use: Never Smoker  . Smokeless Tobacco Use: Never Used  Transportation Needs:   . Freight forwarder (Medical):   Marland Kitchen Lack of Transportation (Non-Medical):     Current Diagnosis/Assessment:  Goals Addressed            This Visit's Progress   . Chronic Care Management Pharmacy Care Plan       CARE PLAN ENTRY  Current Barriers:  . Chronic Disease Management support, education, and care coordination needs related to  DM, HTN, HLD, CAD, Depression, GERD, Osteopenia, Obesity, Allergic Rhinitis, Pain   Hypertension . Pharmacist Clinical Goal(s): o Over the next 90 days, patient will work with PharmD and providers to maintain BP goal <140/90 . Current regimen:  o Telmisartan 40mg  daily, metoprolol succinate 25mg  1/2 tab daily . Interventions: o Requested patient to check blood pressure 3-4 times per week and record . Patient self care activities - Over the next 90 days, patient will: o Check BP 3-4 times per week, document, and provide at future appointments o Ensure daily salt intake < 2300 mg/Vinnie Gombert  Hyperlipidemia . Pharmacist Clinical Goal(s): o Over the next 180 days, patient will work with PharmD and providers to maintain  LDL goal < 70 . Current regimen:  o Simvastatin 40mg  daily, Vascepa 1g #2 twice daily . Interventions: o Encouraged patient to take simvastatin in the evening rather than the morning to help with adherence . Patient self care activities - Over the next 180 days, patient will: o Maintain cholesterol medication regimen.   Diabetes . Pharmacist Clinical Goal(s): o Over the next 180 days, patient will work with PharmD and providers to maintain A1c goal <7% . Current regimen:  o Metformin XR 500mg  daily . Interventions: o Requested patient to check blood sugar 3-4 times per week . Patient self care activities - Over the next 180 days, patient will: o Check blood sugar 3-4 times per week, document, and provide at future appointments o Contact provider with any episodes of  hypoglycemia  Medication management . Pharmacist Clinical Goal(s): o Over the next 180 days, patient will work with PharmD and providers to achieve optimal medication adherence . Current pharmacy: [facility] . Interventions o Comprehensive medication review performed. o Continue current medication management strategy . Patient self care activities - Over the next 180 days, patient will: o Focus on medication adherence by filling and taking medications appropriately  o Take medications as prescribed o Report any questions or concerns to PharmD and/or provider(s)  Please see past updates related to this goal by clicking on the "Past Updates" button in the selected goal        Social Hx: 2 Children (oldest - son), 2nd is daughter. Has a 32 year old grandaughter, 1 year old Curator. 2 great grand daughters   She is really stressed with her husband's health. He had a perforation and heart issues. Has had multiple appts and she is working to manage his medical care more than hers.  Diabetes   Recent Relevant Labs: Lab Results  Component Value Date/Time   HGBA1C 6.8 (H) 07/01/2019 09:21 AM    HGBA1C 7.6 (H) 11/16/2018 10:10 AM   MICROALBUR 0.8 11/16/2018 10:10 AM   MICROALBUR 0.2 09/29/2014 09:10 AM    Goal < 7%  Checking BG: Weekly   113 lowest  168 highest (after strawberry cake with birthday celebrations) Eating more veggies and less pasta  Went on a vacation with another couple.  Patient is currently controlled and improved on the following medications: metformin ER 500mg  daily  Denies any symptoms of hypoglycemia. Thinks any symptoms she has faced were due more to anxiety/stress with her husband's health. Congratulated patient on lowered a1c  Last diabetic Eye exam:  Lab Results  Component Value Date/Time   HMDIABEYEEXA No Retinopathy 07/27/2015 09:06 AM  Diabetic Eye Exam 03/01/19 at [facility] = No diabetic retinopathy OU  Last diabetic Foot exam: No results found for: HMDIABFOOTEX   We discussed: diet and exercise extensively  Plan -Check blood sugar 3-4 times per week and record readings -Continue current medications   Hypertension   BP today is:  Unable to assess due to phone visit  Office blood pressures are  BP Readings from Last 3 Encounters:  07/01/19 140/80  11/16/18 128/72  07/23/18 130/64   Blood Pressure Goal <140/90  Patient has failed these meds in the past: hctz  Patient is currently controlled, but may be trending up due to stress on the following medications: telmisartan 40mg  daily, metoprolol succinate 25mg  1/2 tab daily  Patient checks BP at home when feeling symptomatic  Patient home BP readings are ranging: Unable to assess  Update 08/26/19 Checked BP this morning and it was 160 something over 70. States she had her machine calibrated with Dr. Terrial Rhodes machine and it was accurate, so she trusts the machine.  Denies chest pain or dizziness. Does report head aches. She states she has had a tightening in her chest, but feels it is related to anxiety because she can talk herself down and be fine.    We discussed Proper blood pressure measurement technique  Plan -Continue checking blood pressure 3-4 times per week -Continue current medications    Hyperlipidemia   Lipid Panel     Component Value Date/Time   CHOL 116 07/01/2019 0921   TRIG 154.0 (H) 07/01/2019 0921   HDL 31.90 (L) 07/01/2019 0921   CHOLHDL 4 07/01/2019 0921   VLDL 30.8 07/01/2019 2694  LDLCALC 53 07/01/2019 0921   LDLDIRECT 82.0 11/16/2018 1010     ASCVD 10-year risk: Hx of ASCVD  LDL Goal <70 (82 to 53) TG Goal <150 (269 to 154) HDL Goal >50 (32.9 to 31.9)  Patient has failed these meds in past: None noted Patient is currently controlled except for TG and HDL and improved from previous on the following medications: simvastatin 40mg  daily, Vascepa 1g #2 BID (reports she forgets bedtime dose)  Taking simvastatin in the evening vs morning?  Yes  Congratulated patient on improved lipid panel  We discussed:  Continuation of simvastatin in the evening rather than morning.   Plan -Continue current medications   Obesity    Patient has failed these meds in past: None noted Patient is currently stable on the following medications: None  Has maintained 10lb weight loss.  Congratulated patient on maintaining progress in the setting of stress.   We discussed:  Importance of maintaining goals in the midst of stress  Plan -Continue control with diet and exercise

## 2019-08-26 ENCOUNTER — Ambulatory Visit: Payer: PPO | Admitting: Pharmacist

## 2019-08-26 ENCOUNTER — Other Ambulatory Visit: Payer: Self-pay

## 2019-08-26 DIAGNOSIS — E118 Type 2 diabetes mellitus with unspecified complications: Secondary | ICD-10-CM

## 2019-08-26 DIAGNOSIS — I10 Essential (primary) hypertension: Secondary | ICD-10-CM

## 2019-08-26 DIAGNOSIS — E785 Hyperlipidemia, unspecified: Secondary | ICD-10-CM

## 2019-09-02 NOTE — Patient Instructions (Signed)
Visit Information  Goals Addressed            This Visit's Progress   . Chronic Care Management Pharmacy Care Plan       CARE PLAN ENTRY  Current Barriers:  . Chronic Disease Management support, education, and care coordination needs related to  DM, HTN, HLD, CAD, Depression, GERD, Osteopenia, Obesity, Allergic Rhinitis, Pain   Hypertension . Pharmacist Clinical Goal(s): o Over the next 90 days, patient will work with PharmD and providers to maintain BP goal <140/90 . Current regimen:  o Telmisartan 40mg  daily, metoprolol succinate 25mg  1/2 tab daily . Interventions: o Requested patient to check blood pressure 3-4 times per week and record . Patient self care activities - Over the next 90 days, patient will: o Check BP 3-4 times per week, document, and provide at future appointments o Ensure daily salt intake < 2300 mg/Precilla Purnell  Hyperlipidemia . Pharmacist Clinical Goal(s): o Over the next 180 days, patient will work with PharmD and providers to maintain LDL goal < 70 . Current regimen:  o Simvastatin 40mg  daily, Vascepa 1g #2 twice daily . Interventions: o Encouraged patient to take simvastatin in the evening rather than the morning to help with adherence . Patient self care activities - Over the next 180 days, patient will: o Maintain cholesterol medication regimen.   Diabetes . Pharmacist Clinical Goal(s): o Over the next 180 days, patient will work with PharmD and providers to maintain A1c goal <7% . Current regimen:  o Metformin XR 500mg  daily . Interventions: o Requested patient to check blood sugar 3-4 times per week . Patient self care activities - Over the next 180 days, patient will: o Check blood sugar 3-4 times per week, document, and provide at future appointments o Contact provider with any episodes of hypoglycemia  Medication management . Pharmacist Clinical Goal(s): o Over the next 180 days, patient will work with PharmD and providers to achieve optimal  medication adherence . [facility] . Interventions o Comprehensive medication review performed. o Continue current medication management strategy . Patient self care activities - Over the next 180 days, patient will: o Focus on medication adherence by filling and taking medications appropriately  o Take medications as prescribed o Report any questions or concerns to PharmD and/or provider(s)  Please see past updates related to this goal by clicking on the "Past Updates" button in the selected goal         The patient verbalized understanding of instructions provided today and agreed to receive a mailed copy of patient instruction and/or educational materials.  Telephone follow up appointment with pharmacy team member scheduled for: 11/24/2019  Muaz Shorey, PharmD Clinical Pharmacist [facility] [phone]

## 2019-09-06 ENCOUNTER — Other Ambulatory Visit: Payer: Self-pay | Admitting: Internal Medicine

## 2019-09-06 MED FILL — METOPROLOL SUCCINATE ER 25: 25 | 90 days supply | Qty: 45 | Fill #3

## 2019-09-07 DIAGNOSIS — G4733 Obstructive sleep apnea (adult) (pediatric): Secondary | ICD-10-CM | POA: Diagnosis not present

## 2019-09-08 MED FILL — TELMISARTAN 40 MG TABS: 40 | 30 days supply | Qty: 30 | Fill #0

## 2019-09-27 ENCOUNTER — Other Ambulatory Visit: Payer: Self-pay | Admitting: Family Medicine

## 2019-09-27 DIAGNOSIS — E785 Hyperlipidemia, unspecified: Secondary | ICD-10-CM

## 2019-09-27 DIAGNOSIS — Z955 Presence of coronary angioplasty implant and graft: Secondary | ICD-10-CM

## 2019-09-27 DIAGNOSIS — E119 Type 2 diabetes mellitus without complications: Secondary | ICD-10-CM

## 2019-09-27 DIAGNOSIS — E1169 Type 2 diabetes mellitus with other specified complication: Secondary | ICD-10-CM

## 2019-09-28 MED FILL — SIMVASTATIN 40 MG TABLET: 40 | 90 days supply | Qty: 90 | Fill #0

## 2019-09-28 MED FILL — metFORMIN HCL ER 500 MG TB2: 500 | 90 days supply | Qty: 90 | Fill #0

## 2019-10-04 ENCOUNTER — Other Ambulatory Visit: Payer: Self-pay | Admitting: Family Medicine

## 2019-10-04 DIAGNOSIS — F411 Generalized anxiety disorder: Secondary | ICD-10-CM

## 2019-10-04 MED FILL — CLOPIDOGREL 75 MG TABLET: 75 | 90 days supply | Qty: 90 | Fill #2

## 2019-10-04 MED FILL — diazePAM 2 MG TABS: 2 | 10 days supply | Qty: 30 | Fill #0

## 2019-10-04 MED FILL — VASCEPA 1 GM CAPSULE: 1 | 30 days supply | Qty: 120 | Fill #3

## 2019-10-05 DIAGNOSIS — M5417 Radiculopathy, lumbosacral region: Secondary | ICD-10-CM | POA: Diagnosis not present

## 2019-10-05 DIAGNOSIS — M545 Low back pain: Secondary | ICD-10-CM | POA: Diagnosis not present

## 2019-10-05 MED FILL — HYDROCODON-APAP 7.5-325: 7.5-325 | 5 days supply | Qty: 20 | Fill #0

## 2019-10-11 MED FILL — TELMISARTAN 40 MG TABS: 40 | 30 days supply | Qty: 30 | Fill #1

## 2019-10-26 ENCOUNTER — Other Ambulatory Visit: Payer: Self-pay | Admitting: Family Medicine

## 2019-10-26 ENCOUNTER — Encounter: Payer: Self-pay | Admitting: Family Medicine

## 2019-10-26 DIAGNOSIS — F411 Generalized anxiety disorder: Secondary | ICD-10-CM

## 2019-10-26 MED FILL — diazePAM 2 MG TABS: 2 | 10 days supply | Qty: 30 | Fill #0

## 2019-10-26 NOTE — Telephone Encounter (Signed)
Last refill- 10/04/2019

## 2019-11-03 MED ORDER — TELMISARTAN 40 MG PO TABS: 40.0000 mg | ORAL_TABLET | Freq: Every morning | ORAL | 1 refills | Status: DC

## 2019-11-04 MED FILL — TELMISARTAN 40 MG TABS: 40 | 30 days supply | Qty: 30 | Fill #0

## 2019-11-05 DIAGNOSIS — M545 Low back pain: Secondary | ICD-10-CM | POA: Diagnosis not present

## 2019-11-05 DIAGNOSIS — M25561 Pain in right knee: Secondary | ICD-10-CM | POA: Diagnosis not present

## 2019-11-05 DIAGNOSIS — M1711 Unilateral primary osteoarthritis, right knee: Secondary | ICD-10-CM | POA: Diagnosis not present

## 2019-11-05 MED FILL — METHOCARBAMOL 500 MG TABS: 500 | 10 days supply | Qty: 30 | Fill #0

## 2019-11-05 MED FILL — predniSONE 5 MG (21) TBPK: 5 | 6 days supply | Qty: 21 | Fill #0

## 2019-11-17 ENCOUNTER — Telehealth: Payer: Self-pay | Admitting: Physician Assistant

## 2019-11-17 ENCOUNTER — Telehealth (INDEPENDENT_AMBULATORY_CARE_PROVIDER_SITE_OTHER): Payer: PPO | Admitting: Physician Assistant

## 2019-11-17 ENCOUNTER — Encounter: Payer: Self-pay | Admitting: Physician Assistant

## 2019-11-17 VITALS — Ht 61.0 in | Wt 206.0 lb

## 2019-11-17 DIAGNOSIS — G4733 Obstructive sleep apnea (adult) (pediatric): Secondary | ICD-10-CM

## 2019-11-17 DIAGNOSIS — E785 Hyperlipidemia, unspecified: Secondary | ICD-10-CM | POA: Diagnosis not present

## 2019-11-17 DIAGNOSIS — E119 Type 2 diabetes mellitus without complications: Secondary | ICD-10-CM

## 2019-11-17 DIAGNOSIS — R0789 Other chest pain: Secondary | ICD-10-CM

## 2019-11-17 DIAGNOSIS — I25119 Atherosclerotic heart disease of native coronary artery with unspecified angina pectoris: Secondary | ICD-10-CM | POA: Diagnosis not present

## 2019-11-17 DIAGNOSIS — Z9989 Dependence on other enabling machines and devices: Secondary | ICD-10-CM | POA: Diagnosis not present

## 2019-11-17 DIAGNOSIS — I10 Essential (primary) hypertension: Secondary | ICD-10-CM

## 2019-11-17 NOTE — Progress Notes (Signed)
Virtual Visit via Telephone Note   This visit type was conducted due to national recommendations for restrictions regarding the COVID-19 Pandemic (e.g. social distancing) in an effort to limit this patient's exposure and mitigate transmission in our community.  Due to her co-morbid illnesses, this patient is at least at moderate risk for complications without adequate follow up.  This format is felt to be most appropriate for this patient at this time.  The patient did not have access to video technology/had technical difficulties with video requiring transitioning to audio format only (telephone).  All issues noted in this document were discussed and addressed.  No physical exam could be performed with this format.  Please refer to the patient's chart for her  consent to telehealth for [facility].    Date:  11/19/2019   ID:  [name], DOB February 05, 1951, MRN [id number] The patient was identified using 2 identifiers.  Patient Location: Home Provider Location: Office/Clinic  PCP:  Pearline Cables, MD  Cardiologist:  Chrystie Nose, MD  Electrophysiologist:  None   Evaluation Performed:  Follow-Up Visit  Chief Complaint:  Follow up  History of Present Illness:    [name] is a 69 y.o. female with past medical history of CAD, HTN, HLD, DM II, OSA on CPAP and obesity. Patient had DES to proximal LAD in 2010. Last cardiac catheterization performed on 10/02/2012 showed widely patent proximal LAD stent, normal dominant RCA, normal left circumflex artery, EF 60% without wall motion abnormality. Myoview obtained on 12/12/2016 showed EF 78%, fixed small mild apical lateral perfusion defect, no ischemia. Overall low risk study. She was last seen by Dr. Rennis Golden via virtual visit on 07/23/2018 at which time she was doing well.  Patient was contacted today for telephone visit.  She denies any exertional chest pain or shortness of breath.  The only time she ever has some chest discomfort is during  emotional stress.  This does not occur with physical activity.  She mentions her husband also wish to establish with Dr. Rennis Golden as well in the future for management of diastolic dysfunction.  Overall, she has been doing well from cardiology perspective.  She may follow-up in 6 months.  The patient does not have symptoms concerning for COVID-19 infection (fever, chills, cough, or new shortness of breath).    Past Medical History:  Diagnosis Date  . Anemia   . Arthritis   . Bronchitis    hx of   . CAD (coronary artery disease)    a. s/p DES to LAD in 2010 with patent stent by cath in 2014 b. low-risk NST in 11/2016  . Cataracts, bilateral   . Diabetes mellitus without complication (HCC)   . Family history of adverse reaction to anesthesia    pts mother had difficulty awakening   . GERD (gastroesophageal reflux disease)   . Hyperlipidemia LDL goal < 70   . Hypertension   . Lumbar back pain   . Numbness    left leg and foot   . Plantar fascia rupture    Left Foot  . Sleep apnea    uses CPAP    Past Surgical History:  Procedure Laterality Date  . ABDOMINAL HYSTERECTOMY    . BACK SURGERY    . BREAST CYST ASPIRATION  1995  . CAROTID STENT  2009   pt denies   . CORONARY ANGIOPLASTY WITH STENT PLACEMENT  2010and 10-02-2012   Stent DES, Xience to prox. LAD  . DOPPLER ECHOCARDIOGRAPHY  08/01/2009   EF=>55%,LV normal  . LEFT HEART CATHETERIZATION WITH CORONARY ANGIOGRAM N/A 10/02/2012   Procedure: LEFT HEART CATHETERIZATION WITH CORONARY ANGIOGRAM;  Surgeon: Runell Gess, MD;  Location[facility] CATH LAB;  Service: Cardiovascular;  Laterality: N/A;  . lower arterial duplex  06/20/10   abi's normal,rgt 0.98,lft 1.06;bilateral PVRs normal  . LUMBAR LAMINECTOMY/DECOMPRESSION MICRODISCECTOMY Left 01/06/2013   Procedure: MICRO LUMBAR DECOMPRESSION L4-5 AND L5-S1;  Surgeon: Javier Docker, MD;  Location: [facility] ORS;  Service: Orthopedics;  Laterality: Left;  . LUMBAR LAMINECTOMY/DECOMPRESSION  MICRODISCECTOMY Left 10/12/2014   Procedure: REVISION MICRO LUMBAR/DECOMPRESSION L4-5 LEFT ;  Surgeon: Jene Every, MD;  Location: [facility] ORS;  Service: Orthopedics;  Laterality: Left;  . NM MYOCAR PERF WALL MOTION  09/22/2008   lexiscan-EF 83%; glogal LV systolic fx is norm. ,evidence of mild ischemia basal anterior,midanterior and apical lateral region(s).   . ORIF PATELLA Right 08/29/2016   Procedure: OPEN REDUCTION INTERNAL (ORIF) FIXATION RIGHT PATELLA;  Surgeon: Samson Frederic, MD;  Location: [facility] OR[facility];  Service: Orthopedics;  Laterality: Right;  Adductor Block  . TUBAL LIGATION    . TYMPANOPLASTY Bilateral   . UVULOPALATOPHARYNGOPLASTY     pt denies      Current Meds  Medication Sig  . acetaminophen (TYLENOL) 500 MG tablet Take 500 mg by mouth every 6 (six) hours as needed for mild pain or headache.  . alendronate (FOSAMAX) 70 MG tablet Take 1 tablet (70 mg total) by mouth every 7 (seven) days. Take with a full glass of water on an empty stomach.  Marland Kitchen aspirin EC 81 MG EC tablet Take 1 tablet (81 mg total) by mouth daily.  . calcium carbonate (OS-CAL) 1250 (500 Ca) MG chewable tablet Chew 1 tablet by mouth daily.  . cetirizine (ZYRTEC) 10 MG chewable tablet Chew 10 mg by mouth daily as needed for allergies.  . cholecalciferol (VITAMIN D) 1000 UNITS tablet Take 1,000 Units by mouth daily.  . clopidogrel (PLAVIX) 75 MG tablet TAKE 1 TABLET BY MOUTH DAILY.  . cyclobenzaprine (FLEXERIL) 10 MG tablet TAKE 1 TABLET BY MOUTH TWICE DAILY AS NEEDED FOR MUSCLE SPASMS  . diazepam (VALIUM) 2 MG tablet TAKE 1 TABLET BY MOUTH EVERY 8 HOURS AS NEEDED FOR MUSCLE SPASMS  . diclofenac sodium (VOLTAREN) 1 % GEL Apply 2 g topically 4 (four) times daily.  . famotidine (PEPCID) 20 MG tablet TAKE 1 TABLET BY MOUTH 2 TIMES DAILY. (Patient taking differently: Take 20 mg by mouth daily. )  . FLUoxetine (PROZAC) 20 MG capsule Take 1 capsule (20 mg total) by mouth daily.  . fluticasone (FLONASE) 50 MCG/ACT nasal spray  Place 1 spray into both nostrils daily as needed for allergies or rhinitis.  Bess Harvest Ethyl (VASCEPA) 1 g CAPS Take 2 capsules (2 g total) by mouth 2 (two) times daily.  . metFORMIN (GLUCOPHAGE-XR) 500 MG 24 hr tablet Take 1 tablet (500 mg total) by mouth daily with breakfast.  . methocarbamol (ROBAXIN) 500 MG tablet TAKE 1 TABLET (500 MG TOTAL) BY MOUTH EVERY 8 (EIGHT) HOURS AS NEEDED FOR MUSCLE SPASMS.  . metoprolol succinate (TOPROL-XL) 25 MG 24 hr tablet Take 0.5 tablets (12.5 mg total) by mouth daily.  . Multiple Vitamin (MULTIVITAMIN WITH MINERALS) TABS tablet Take 1 tablet by mouth daily.  . nitroGLYCERIN (NITROSTAT) 0.4 MG SL tablet PLACE 1 TABLET UNDER THE TONGUE EVERY 5 MINUTES FOR 3 DOSES AS NEEDED FOR CHEST PAIN.  . simvastatin (ZOCOR) 40 MG tablet Take 1 tablet (40 mg total)  by mouth daily.  Marland Kitchen. telmisartan (MICARDIS) 40 MG tablet Take 1 tablet (40 mg total) by mouth every morning. NEED OV.     Allergies:   Niacin and related   Social History   Tobacco Use  . Smoking status: Never Smoker  . Smokeless tobacco: Never Used  Vaping Use  . Vaping Use: Never used  Substance Use Topics  . Alcohol use: Yes    Comment: occasional, 1 a month wine  . Drug use: No     Family Hx: The patient's family history includes Cancer in her brother and paternal grandmother; Coronary artery disease in her mother; Dementia in her mother; Diabetes in her paternal grandfather; Heart attack in her father; Heart disease in her brother; Hyperlipidemia in her father; Hypertension in her brother and father; Other in her father; Rheum arthritis in her mother. There is no history of Breast cancer.  ROS:   Please see the history of present illness.     All other systems reviewed and are negative.   Prior CV studies:   The following studies were reviewed today:  myoview 12/12/2016  There was no ST segment deviation noted during stress.  This is a low risk study.  The left ventricular ejection  fraction is hyperdynamic (>65%).  Nuclear stress EF: 78%.   1. Fixed small, mild apical lateral perfusion defect.  Normal wall motion suggests that this could be artifact.  No evidence for ischemia.  2. EF > 65%, normal wall motion.   Labs/Other Tests and Data Reviewed:    EKG:  An ECG dated 11/18/2019 was personally reviewed today and demonstrated:  Sinus rhythm with right bundle branch block.  Recent Labs: 07/01/2019: ALT 24; TSH 2.35 11/18/2019: BUN 17; Creatinine, Ser 0.81; Hemoglobin 12.1; Platelets 337; Potassium 4.1; Sodium 139   Recent Lipid Panel Lab Results  Component Value Date/Time   CHOL 116 07/01/2019 09:21 AM   TRIG 154.0 (H) 07/01/2019 09:21 AM   HDL 31.90 (L) 07/01/2019 09:21 AM   CHOLHDL 4 07/01/2019 09:21 AM   LDLCALC 53 07/01/2019 09:21 AM   LDLDIRECT 82.0 11/16/2018 10:10 AM    Wt Readings from Last 3 Encounters:  11/17/19 206 lb (93.4 kg)  07/01/19 208 lb (94.3 kg)  11/16/18 214 lb (97.1 kg)     Objective:    Vital Signs:  Ht 5\' 1"  (1.549 m)   Wt 206 lb (93.4 kg)   LMP  (LMP Unknown)   BMI 38.92 kg/m    VITAL SIGNS:  reviewed  ASSESSMENT & PLAN:    1. Atypical chest pain: Recent atypical chest pain only driven by emotional stress and does not occur with physical activity.  No further work-up.  2. CAD: Last PCI was in 2011.  Cardiac catheterization in 2014 showed patent stent.  Last Myoview in 2018 was low risk.  Continue aspirin and Plavix  3. Hypertension: Continue on current therapy  4. Hyperlipidemia: On Zocor  5. DM2: Managed by primary care provider  6. Obstructive sleep apnea: On CPAP therapy.  COVID-19 Education: The signs and symptoms of COVID-19 were discussed with the patient and how to seek care for testing (follow up with PCP or arrange E-visit).  The importance of social distancing was discussed today.  Time:   Today, I have spent 8 minutes with the patient with telehealth technology discussing the above problems.      Medication Adjustments/Labs and Tests Ordered: Current medicines are reviewed at length with the patient today.  Concerns regarding medicines  are outlined above.   Tests Ordered: No orders of the defined types were placed in this encounter.   Medication Changes: No orders of the defined types were placed in this encounter.   Follow Up:  In Person in 1 year(s)  Signed, Azalee Course, [place]  11/19/2019 11:42 PM    [facility]

## 2019-11-17 NOTE — Patient Instructions (Signed)
Medication Instructions:  No Changes In Medications at this time.   *If you need a refill on your cardiac medications before your next appointment, please call your pharmacy*   Lab Work: None Ordered At This Time.   If you have labs (blood work) drawn today and your tests are completely normal, you will receive your results only by:  MyChart Message (if you have MyChart) OR  A paper copy in the mail If you have any lab test that is abnormal or we need to change your treatment, we will call you to review the results.   Testing/Procedures: None Ordered At This Time.     Follow-Up: At [facility], you and your health needs are our priority.  As part of our continuing mission to provide you with exceptional heart care, we have created designated Provider Care Teams.  These Care Teams include your primary Cardiologist (physician) and Advanced Practice Providers (APPs -  Physician Assistants and Nurse Practitioners) who all work together to provide you with the care you need, when you need it.  We recommend signing up for the patient portal called "MyChart".  Sign up information is provided on this After Visit Summary.  MyChart is used to connect with patients for Virtual Visits (Telemedicine).  Patients are able to view lab/test results, encounter notes, upcoming appointments, etc.  Non-urgent messages can be sent to your provider as well.   To learn more about what you can do with MyChart, go to [email or web address].    Your next appointment:   Move Appointment to [facility]  The format for your next appointment:   In Person  Provider:   K. Italy Hilty, MD   Other Instructions

## 2019-11-17 NOTE — Telephone Encounter (Signed)
[name] is calling stating [name] was needing a BP reading to document for her virtual appointment today. Lexie states she took her BP and it was 146/69 HR 64. She also requested I advise [name] that her BP normally shoots up when talking to Doctors due to being anxious.

## 2019-11-17 NOTE — Telephone Encounter (Signed)
Returned call to patient regarding her BP being 146-59. Patient states her blood pressure usually increases when she speaks to Dr.s due to being slightly anxious.  Patients states her BP is usually 125-130's systolic and HR 60's-70. Spoke with [name] and was advised to let the patient know to monitor BP over the next several days and to call back into office and let us know if BP remains elevated. Patient verbalized understanding.

## 2019-11-18 ENCOUNTER — Emergency Department (HOSPITAL_COMMUNITY)
Admission: EM | Admit: 2019-11-18 | Discharge: 2019-11-19 | Disposition: A | Discharge status: Home / Self Care | Payer: PPO | Attending: Emergency Medicine | Admitting: Emergency Medicine

## 2019-11-18 ENCOUNTER — Emergency Department (HOSPITAL_COMMUNITY): Payer: PPO

## 2019-11-18 ENCOUNTER — Other Ambulatory Visit: Payer: Self-pay

## 2019-11-18 ENCOUNTER — Encounter (HOSPITAL_COMMUNITY): Payer: Self-pay

## 2019-11-18 DIAGNOSIS — Z5321 Procedure and treatment not carried out due to patient leaving prior to being seen by health care provider: Secondary | ICD-10-CM | POA: Diagnosis not present

## 2019-11-18 DIAGNOSIS — I10 Essential (primary) hypertension: Secondary | ICD-10-CM | POA: Diagnosis not present

## 2019-11-18 DIAGNOSIS — R079 Chest pain, unspecified: Secondary | ICD-10-CM | POA: Insufficient documentation

## 2019-11-18 DIAGNOSIS — R0602 Shortness of breath: Secondary | ICD-10-CM | POA: Diagnosis not present

## 2019-11-18 DIAGNOSIS — I959 Hypotension, unspecified: Secondary | ICD-10-CM | POA: Diagnosis not present

## 2019-11-18 DIAGNOSIS — R110 Nausea: Secondary | ICD-10-CM | POA: Insufficient documentation

## 2019-11-18 DIAGNOSIS — R0789 Other chest pain: Secondary | ICD-10-CM | POA: Diagnosis not present

## 2019-11-18 LAB — BASIC METABOLIC PANEL
Anion gap: 12 (ref 5–15)
BUN: 17 mg/dL (ref 8–23)
CO2: 24 mmol/L (ref 22–32)
Calcium: 9.7 mg/dL (ref 8.9–10.3)
Chloride: 103 mmol/L (ref 98–111)
Creatinine, Ser: 0.81 mg/dL (ref 0.44–1.00)
GFR calc Af Amer: >60 mL/min (ref >60)
GFR calc non Af Amer: >60 mL/min (ref >60)
Glucose, Bld: 155 mg/dL — ABNORMAL HIGH (ref 70–99)
Potassium: 4.1 mmol/L (ref 3.5–5.1)
Sodium: 139 mmol/L (ref 135–145)

## 2019-11-18 LAB — CBC
HCT: 39.5 % (ref 36.0–46.0)
Hemoglobin: 12.1 g/dL (ref 12.0–15.0)
MCH: 25.3 pg — ABNORMAL LOW (ref 26.0–34.0)
MCHC: 30.6 g/dL (ref 30.0–36.0)
MCV: 82.5 fL (ref 80.0–100.0)
Platelets: 337 10*3/uL (ref 150–400)
RBC: 4.79 MIL/uL (ref 3.87–5.11)
RDW: 14.9 % (ref 11.5–15.5)
WBC: 12.1 10*3/uL — ABNORMAL HIGH (ref 4.0–10.5)
nRBC: 0 % (ref 0.0–0.2)

## 2019-11-18 LAB — TROPONIN I (HIGH SENSITIVITY)
Troponin I (High Sensitivity): 5 ng/L (ref <18)
Troponin I (High Sensitivity): 8 ng/L (ref <18)

## 2019-11-18 LAB — CBG MONITORING, ED: Glucose-Capillary: 134 mg/dL — ABNORMAL HIGH (ref 70–99)

## 2019-11-18 NOTE — ED Triage Notes (Signed)
Pt BIB [facility] with mid-sternum CP starting approx 1 hour ago, started while walking through [employer] with her spouse, took nitro x3, pain 10/10 after nitro.  Originally nauseous and SOB, nausea has since subsided. EMS gave an additional nitro x3  BP 160/66 HR 80 RR 18-20  20G LAC

## 2019-11-19 ENCOUNTER — Telehealth: Payer: Self-pay | Admitting: Internal Medicine

## 2019-11-19 ENCOUNTER — Encounter: Payer: Self-pay | Admitting: Family Medicine

## 2019-11-19 NOTE — Telephone Encounter (Signed)
    Pt c/o of Chest Pain: STAT if CP now or developed within 24 hours  1. Are you having CP right now? No  2. Are you experiencing any other symptoms (ex. SOB, nausea, vomiting, sweating)? SOB yesterday  3. How long have you been experiencing CP? yesterday  4. Is your CP continuous or coming and going? Coming and going  5. Have you taken Nitroglycerin? Yes, she was give nitro and aspirin at the hospital   Pt calling, she said yesterday she had CP while she was out shopping, ambulance was called and she was brought to the hospital, she said she was there over night in the lobby and was not seen by a doctor. She said her HR is around 50s her BP systolic highest is 200 and mostly ranging 150-160 but her diastolic is only at 40s. She wants to be seen today, no available. First APP available is 09/01.

## 2019-11-19 NOTE — Telephone Encounter (Signed)
Called pt back and informed her that she needs to go to the ER to be evaluated and seen for this CP. She states that she "went to the ER" yesterday and was in the waiting room "for over 15 hours and had to leave" informed pt once again to be seen in the ER and she states that she will "try". Informed pt that we will sometimes send pt's to the ER from our office when experiencing chest pain. Verbalizes understanding.

## 2019-11-19 NOTE — ED Notes (Signed)
Lwbs. 

## 2019-11-22 ENCOUNTER — Other Ambulatory Visit: Payer: Self-pay | Admitting: Family Medicine

## 2019-11-22 DIAGNOSIS — F411 Generalized anxiety disorder: Secondary | ICD-10-CM

## 2019-11-22 MED FILL — FLUoxetine HCL 20 MG CAPS: 20 | 90 days supply | Qty: 90 | Fill #0

## 2019-11-22 NOTE — H&P (View-Only) (Signed)
Cardiology Clinic Note   Patient Name: [name] Date of Encounter: 11/24/2019  Primary Care Provider:  Pearline Cables, MD Primary Cardiologist:  Chrystie Nose, MD  Patient Profile    [name] 69 year old female presents the clinic today for evaluation of her atypical chest pain.  Past Medical History    Past Medical History:  Diagnosis Date  . Anemia   . Arthritis   . Bronchitis    hx of   . CAD (coronary artery disease)    a. s/p DES to LAD in 2010 with patent stent by cath in 2014 b. low-risk NST in 11/2016  . Cataracts, bilateral   . Diabetes mellitus without complication (HCC)   . Family history of adverse reaction to anesthesia    pts mother had difficulty awakening   . GERD (gastroesophageal reflux disease)   . Hyperlipidemia LDL goal < 70   . Hypertension   . Lumbar back pain   . Numbness    left leg and foot   . Plantar fascia rupture    Left Foot  . Sleep apnea    uses CPAP    Past Surgical History:  Procedure Laterality Date  . ABDOMINAL HYSTERECTOMY    . BACK SURGERY    . BREAST CYST ASPIRATION  1995  . CAROTID STENT  2009   pt denies   . CORONARY ANGIOPLASTY WITH STENT PLACEMENT  2010and 10-02-2012   Stent DES, Xience to prox. LAD  . DOPPLER ECHOCARDIOGRAPHY  08/01/2009   EF=>55%,LV normal  . LEFT HEART CATHETERIZATION WITH CORONARY ANGIOGRAM N/A 10/02/2012   Procedure: LEFT HEART CATHETERIZATION WITH CORONARY ANGIOGRAM;  Surgeon: Runell Gess, MD;  Location: [facility] CATH LAB;  Service: Cardiovascular;  Laterality: N/A;  . lower arterial duplex  06/20/10   abi's normal,rgt 0.98,lft 1.06;bilateral PVRs normal  . LUMBAR LAMINECTOMY/DECOMPRESSION MICRODISCECTOMY Left 01/06/2013   Procedure: MICRO LUMBAR DECOMPRESSION L4-5 AND L5-S1;  Surgeon: Javier Docker, MD;  Location: [facility];  Service: Orthopedics;  Laterality: Left;  . LUMBAR LAMINECTOMY/DECOMPRESSION MICRODISCECTOMY Left 10/12/2014   Procedure: REVISION MICRO LUMBAR/DECOMPRESSION  L4-5 LEFT ;  Surgeon: Jene Every, MD;  Location: [facility];  Service: Orthopedics;  Laterality: Left;  . NM MYOCAR PERF WALL MOTION  09/22/2008   lexiscan-EF 83%; glogal LV systolic fx is norm. ,evidence of mild ischemia basal anterior,midanterior and apical lateral region(s).   . ORIF PATELLA Right 08/29/2016   Procedure: OPEN REDUCTION INTERNAL (ORIF) FIXATION RIGHT PATELLA;  Surgeon: Samson Frederic, MD;  Location: [facility];  Service: Orthopedics;  Laterality: Right;  Adductor Block  . TUBAL LIGATION    . TYMPANOPLASTY Bilateral   . UVULOPALATOPHARYNGOPLASTY     pt denies     Allergies  Allergies  Allergen Reactions  . Niacin And Related Other (See Comments)    Whelps and skin flushed, mouth tingling    History of Present Illness    [name] has a PMH of coronary artery disease, hypertension, hyperlipidemia, diabetes mellitus type 2, obstructive sleep apnea on CPAP and obesity.  She underwent cardiac catheterization with PCI and DES to proximal LAD 2010.  Last cardiac catheterization 10/02/2012 showed widely patent proximal LAD stent, normal dominant RCA, normal left LCx, LVEF 60% without wall motion abnormality.  A nuclear stress test 9/18 showed an EF of 78%, fixed small mild apical lateral perfusion defect, and no ischemia.  It was a low risk study.  She was seen by Dr. Rennis Golden via virtual visit 07/23/2018, during that  time she was doing well.  She had a follow-up virtual visit with pounding PA-C on 11/17/2019.  During that time she denied exertional chest pain and increased work of breathing.  She noted chest discomfort during emotional stress/distress.  She denied chest discomfort during physical activity.  Her blood pressure was slightly elevated at 1 40-50 during her virtual follow-up visit.  She was instructed to monitor her blood pressures and follow-up.  She called back on 11/17/2018 1 in the afternoon and indicated that her blood pressure was running 125-130 with heart rates in the  60s-70s.  She was out shopping with her husband on 11/18/2019 and developed substernal chest pain.  She took nitroglycerin and aspirin.  She was transported via EMS to the emergency department.  She stayed overnight and was not seen.  She chose to leave the emergency department without being seen.  EKG showed normal sinus rhythm with right bundle branch block 63 bpm.  Troponins were low and flat.  CBC shows slightly elevated WBCs, BMP unremarkable.  Chest x-ray showed no acute findings.  She presents to the clinic today for follow-up evaluation and states she was shopping and developed substernal chest pain.  It was not relieved by nitroglycerin and radiated to her left pectoral region.  She underwent testing in the emergency department and was not seen in 20 hours.  She presented to the clinic today and states that she has had occasional intermittent periods of sharp chest pain that are relieved with rest.  She has no exertional chest pain.  She states that prior to her incident while she was shopping she had no other episodes of chest discomfort.  She remains somewhat active shopping 3-4 times a week doing significant amounts of walking.  She did indicate that she has some increased stress hearing that 3 days prior to her chest discomfort 3 individuals in her area had died from Covid.  I will order an echocardiogram, nuclear stress test, have her increase her physical activity as tolerated, follow a low-sodium diet and follow-up in 1 month.  Today she denies chest pain, shortness of breath, lower extremity edema, fatigue, palpitations, melena, hematuria, hemoptysis, diaphoresis, weakness, presyncope, syncope, orthopnea, and PND.   Home Medications    Prior to Admission medications   Medication Sig Start Date End Date Taking? Authorizing Provider  acetaminophen (TYLENOL) 500 MG tablet Take 500 mg by mouth every 6 (six) hours as needed for mild pain or headache.    [provider]  alendronate  (FOSAMAX) 70 MG tablet Take 1 tablet (70 mg total) by mouth every 7 (seven) days. Take with a full glass of water on an empty stomach. 05/28/19   Copland, [name], MD  aspirin EC 81 MG EC tablet Take 1 tablet (81 mg total) by mouth daily. 08/30/16   Swinteck, Arlys John, MD  calcium carbonate (OS-CAL) 1250 (500 Ca) MG chewable tablet Chew 1 tablet by mouth daily.    [provider]  cetirizine (ZYRTEC) 10 MG chewable tablet Chew 10 mg by mouth daily as needed for allergies.    [provider]  cholecalciferol (VITAMIN D) 1000 UNITS tablet Take 1,000 Units by mouth daily.    [provider]  clopidogrel (PLAVIX) 75 MG tablet TAKE 1 TABLET BY MOUTH DAILY. 03/22/19   Hilty, Lisette Abu, MD  cyclobenzaprine (FLEXERIL) 10 MG tablet TAKE 1 TABLET BY MOUTH TWICE DAILY AS NEEDED FOR MUSCLE SPASMS 05/08/18   Copland, [name], MD  diazepam (VALIUM) 2 MG tablet  TAKE 1 TABLET BY MOUTH EVERY 8 HOURS AS NEEDED FOR MUSCLE SPASMS 10/26/19   Copland, Gwenlyn FoundJessica C, MD  diclofenac sodium (VOLTAREN) 1 % GEL Apply 2 g topically 4 (four) times daily. 02/01/18   Joseph ArtVann, Jessica U, DO  famotidine (PEPCID) 20 MG tablet TAKE 1 TABLET BY MOUTH 2 TIMES DAILY. Patient taking differently: Take 20 mg by mouth daily.  07/25/18   Hilty, Lisette AbuKenneth C, MD  FLUoxetine (PROZAC) 20 MG capsule Take 1 capsule (20 mg total) by mouth daily. 11/16/18   Copland, Gwenlyn FoundJessica C, MD  fluticasone (FLONASE) 50 MCG/ACT nasal spray Place 1 spray into both nostrils daily as needed for allergies or rhinitis.    [provider]  Icosapent Ethyl (VASCEPA) 1 g CAPS Take 2 capsules (2 g total) by mouth 2 (two) times daily. 11/18/18   Copland, Gwenlyn FoundJessica C, MD  metFORMIN (GLUCOPHAGE-XR) 500 MG 24 hr tablet Take 1 tablet (500 mg total) by mouth daily with breakfast. 09/28/19   Copland, Gwenlyn FoundJessica C, MD  methocarbamol (ROBAXIN) 500 MG tablet TAKE 1 TABLET (500 MG TOTAL) BY MOUTH EVERY 8 (EIGHT) HOURS AS NEEDED FOR MUSCLE SPASMS. 08/20/19   Copland, Gwenlyn FoundJessica C,  MD  metoprolol succinate (TOPROL-XL) 25 MG 24 hr tablet Take 0.5 tablets (12.5 mg total) by mouth daily. 11/16/18   Copland, Gwenlyn FoundJessica C, MD  Multiple Vitamin (MULTIVITAMIN WITH MINERALS) TABS tablet Take 1 tablet by mouth daily.    [provider]  nitroGLYCERIN (NITROSTAT) 0.4 MG SL tablet PLACE 1 TABLET UNDER THE TONGUE EVERY 5 MINUTES FOR 3 DOSES AS NEEDED FOR CHEST PAIN. 11/03/18   Hilty, Lisette AbuKenneth C, MD  simvastatin (ZOCOR) 40 MG tablet Take 1 tablet (40 mg total) by mouth daily. 09/28/19   Copland, Gwenlyn FoundJessica C, MD  telmisartan (MICARDIS) 40 MG tablet Take 1 tablet (40 mg total) by mouth every morning. NEED OV. 11/03/19   Hilty, Lisette AbuKenneth C, MD    Family History    Family History  Problem Relation Age of Onset  . Coronary artery disease Mother   . Rheum arthritis Mother   . Dementia Mother   . Heart attack Father   . Hypertension Father   . Hyperlipidemia Father   . Other Father        MVA  . Hypertension Brother   . Cancer Brother   . Heart disease Brother   . Cancer Paternal Grandmother        stomach  . Diabetes Paternal Grandfather   . Breast cancer Neg Hx    She indicated that her mother is deceased. She indicated that her father is deceased. She indicated that both of her sisters are alive. She indicated that two of her three brothers are alive. She indicated that her maternal grandmother is deceased. She indicated that her maternal grandfather is deceased. She indicated that her paternal grandmother is deceased. She indicated that her paternal grandfather is deceased. She indicated that her daughter is alive. She indicated that her son is alive. She indicated that the status of her neg hx is unknown.  Social History    Social History   Socioeconomic History  . Marital status: Married    Spouse name: [name]  . Number of children: 2  . Years of education: MSN  . Highest education level: Not on file  Occupational History  . Occupation: DIRECTOR    Employer: [employer]  Tobacco Use  . Smoking status: Never Smoker  . Smokeless tobacco: Never Used  Vaping Use  .  Vaping Use: Never used  Substance and Sexual Activity  . Alcohol use: Yes    Comment: occasional, 1 a month wine  . Drug use: No  . Sexual activity: Yes  Other Topics Concern  . Not on file  Social History Narrative      Married to [name]. Education: College. Exercise: Yes   Patient lives at home with her spouse. retired   Caffeine use: 1 drink of tea a day   Social Determinants of Health   Financial Resource Strain:   . Difficulty of Paying Living Expenses: Not on file  Food Insecurity:   . Worried About Running Out of Food in the Last Year: Not on file  . Ran Out of Food in the Last Year: Not on file  Transportation Needs:   . Lack of Transportation (Medical): Not on file  . Lack of Transportation (Non-Medical): Not on file  Physical Activity:   . Days of Exercise per Week: Not on file  . Minutes of Exercise per Session: Not on file  Stress:   . Feeling of Stress : Not on file  Social Connections:   . Frequency of Communication with Friends and Family: Not on file  . Frequency of Social Gatherings with Friends and Family: Not on file  . Attends Religious Services: Not on file  . Active Member of Clubs or Organizations: Not on file  . Attends Club or Organization Meetings: Not on file  . Marital Status: Not on file  Intimate Partner Violence:   . Fear of Current or Ex-Partner: Not on file  . Emotionally Abused: Not on file  . Physically Abused: Not on file  . Sexually Abused: Not on file     Review of Systems    General:  No chills, fever, night sweats or weight changes.  Cardiovascular:  No chest pain, dyspnea on exertion, edema, orthopnea, palpitations, paroxysmal nocturnal dyspnea. Dermatological: No rash, lesions/masses Respiratory: No cough, dyspnea Urologic: No hematuria, dysuria Abdominal:   No nausea, vomiting, diarrhea, bright red blood per rectum,  melena, or hematemesis Neurologic:  No visual changes, wkns, changes in mental status. All other systems reviewed and are otherwise negative except as noted above.  Physical Exam    VS:  BP 116/66 (BP Location: Left Arm, Patient Position: Sitting, Cuff Size: Normal)   Pulse 62   Ht 5' 1" (1.549 m)   Wt 209 lb 3.2 oz (94.9 kg)   LMP  (LMP Unknown)   SpO2 96%   BMI 39.53 kg/m  , BMI Body mass index is 39.53 kg/m. GEN: Well nourished, well developed, in no acute distress. HEENT: normal. Neck: Supple, no JVD, carotid bruits, or masses. Cardiac: RRR, no murmurs, rubs, or gallops. No clubbing, cyanosis, edema.  Radials/DP/PT 2+ and equal bilaterally.  Respiratory:  Respirations regular and unlabored, clear to auscultation bilaterally. GI: Soft, nontender, nondistended, BS + x 4. MS: no deformity or atrophy. Skin: warm and dry, no rash. Neuro:  Strength and sensation are intact. Psych: Normal affect.  Accessory Clinical Findings    Recent Labs: 07/01/2019: ALT 24; TSH 2.35 11/18/2019: BUN 17; Creatinine, Ser 0.81; Hemoglobin 12.1; Platelets 337; Potassium 4.1; Sodium 139   Recent Lipid Panel    Component Value Date/Time   CHOL 116 07/01/2019 0921   TRIG 154.0 (H) 07/01/2019 0921   HDL 31.90 (L) 07/01/2019 0921   CHOLHDL 4 07/01/2019 0921   VLDL 30.8 07/01/2019 0921   LDLCALC 53 07/01/2019 0921   LDLDIRECT 82.0 11/16/2018 1010      ECG personally reviewed by me today-normal sinus rhythm right bundle branch block 64 bpm- No acute changes  Cardiac event monitor 02/23/2016 Monitor shows sinus rhythm and sinus bradycardia. Symptoms of chest pressure reported, however, they do not correlate with the monitor.  Chrystie Nose, MD, [facility] Attending Cardiologist [facility]  EKG 11/19/2019 Normal sinus rhythm right bundle branch block 63 bpm   Assessment & Plan   1.  Coronary artery disease-no chest pain today.  Has only had intermittent twinges of chest pain lasting minutes  and relieved with rest since being in the emergency department.  Recently presented to emergency department on 11/18/2019 with substernal chest pain.  Patient was out shopping took nitroglycerin and aspirin.  She presented to the emergency department via EMS.  Troponins low and flat, no acute changes on EKG, CXR unremarkable.  PCI 2014 showed patent stent.  Nuclear stress test 2018 low risk. Continue Plavix, aspirin, metoprolol, nitroglycerin Heart healthy low-sodium diet-salty 6 given Increase physical activity as tolerated Order echocardiogram, nuclear stress test  Essential hypertension-BP today 116/66.  Well-controlled at home. Continue metoprolol, Micardis Heart healthy low-sodium diet-salty 6 given Increase physical activity as tolerated  Hyperlipidemia-07/01/2019: Cholesterol 116; HDL 31.90; LDL Cholesterol 53; Triglycerides 154.0; VLDL 30.8 Continue Zocor, Vascepa Heart healthy low-sodium high-fiber diet.   Increase physical activity as tolerated  Diabetes mellitus type 2-A1c 6.8 on 07/01/2019 Continue Metformin Heart healthy low-sodium diet-salty 6 given Increase physical activity as tolerated  Obstructive sleep apnea-compliant with CPAP therapy. Continue CPAP  Disposition: Follow-up with Dr. Rennis Golden or myself in 1 months.   Thomasene Ripple. Emma Birchler NP-C    11/24/2019, 3:06 PM [facility] [address] Office [phone] Fax [phone]  Notice: This dictation was prepared with Dragon dictation along with smaller phrase technology. Any transcriptional errors that result from this process are unintentional and may not be corrected upon review.

## 2019-11-22 NOTE — Progress Notes (Signed)
Cardiology Clinic Note   Patient Name: [name] Date of Encounter: 11/24/2019  Primary Care Provider:  Pearline Cables, MD Primary Cardiologist:  Chrystie Nose, MD  Patient Profile    [name] 69 year old female presents the clinic today for evaluation of her atypical chest pain.  Past Medical History    Past Medical History:  Diagnosis Date  . Anemia   . Arthritis   . Bronchitis    hx of   . CAD (coronary artery disease)    a. s/p DES to LAD in 2010 with patent stent by cath in 2014 b. low-risk NST in 11/2016  . Cataracts, bilateral   . Diabetes mellitus without complication (HCC)   . Family history of adverse reaction to anesthesia    pts mother had difficulty awakening   . GERD (gastroesophageal reflux disease)   . Hyperlipidemia LDL goal < 70   . Hypertension   . Lumbar back pain   . Numbness    left leg and foot   . Plantar fascia rupture    Left Foot  . Sleep apnea    uses CPAP    Past Surgical History:  Procedure Laterality Date  . ABDOMINAL HYSTERECTOMY    . BACK SURGERY    . BREAST CYST ASPIRATION  1995  . CAROTID STENT  2009   pt denies   . CORONARY ANGIOPLASTY WITH STENT PLACEMENT  2010and 10-02-2012   Stent DES, Xience to prox. LAD  . DOPPLER ECHOCARDIOGRAPHY  08/01/2009   EF=>55%,LV normal  . LEFT HEART CATHETERIZATION WITH CORONARY ANGIOGRAM N/A 10/02/2012   Procedure: LEFT HEART CATHETERIZATION WITH CORONARY ANGIOGRAM;  Surgeon: Runell Gess, MD;  Location: [facility] CATH LAB;  Service: Cardiovascular;  Laterality: N/A;  . lower arterial duplex  06/20/10   abi's normal,rgt 0.98,lft 1.06;bilateral PVRs normal  . LUMBAR LAMINECTOMY/DECOMPRESSION MICRODISCECTOMY Left 01/06/2013   Procedure: MICRO LUMBAR DECOMPRESSION L4-5 AND L5-S1;  Surgeon: Javier Docker, MD;  Location: [facility];  Service: Orthopedics;  Laterality: Left;  . LUMBAR LAMINECTOMY/DECOMPRESSION MICRODISCECTOMY Left 10/12/2014   Procedure: REVISION MICRO LUMBAR/DECOMPRESSION  L4-5 LEFT ;  Surgeon: Jene Every, MD;  Location: [facility];  Service: Orthopedics;  Laterality: Left;  . NM MYOCAR PERF WALL MOTION  09/22/2008   lexiscan-EF 83%; glogal LV systolic fx is norm. ,evidence of mild ischemia basal anterior,midanterior and apical lateral region(s).   . ORIF PATELLA Right 08/29/2016   Procedure: OPEN REDUCTION INTERNAL (ORIF) FIXATION RIGHT PATELLA;  Surgeon: Samson Frederic, MD;  Location: [facility];  Service: Orthopedics;  Laterality: Right;  Adductor Block  . TUBAL LIGATION    . TYMPANOPLASTY Bilateral   . UVULOPALATOPHARYNGOPLASTY     pt denies     Allergies  Allergies  Allergen Reactions  . Niacin And Related Other (See Comments)    Whelps and skin flushed, mouth tingling    History of Present Illness    [name] has a PMH of coronary artery disease, hypertension, hyperlipidemia, diabetes mellitus type 2, obstructive sleep apnea on CPAP and obesity.  She underwent cardiac catheterization with PCI and DES to proximal LAD 2010.  Last cardiac catheterization 10/02/2012 showed widely patent proximal LAD stent, normal dominant RCA, normal left LCx, LVEF 60% without wall motion abnormality.  A nuclear stress test 9/18 showed an EF of 78%, fixed small mild apical lateral perfusion defect, and no ischemia.  It was a low risk study.  She was seen by Dr. Rennis Golden via virtual visit 07/23/2018, during that  time she was doing well.  She had a follow-up virtual visit with pounding PA-C on 11/17/2019.  During that time she denied exertional chest pain and increased work of breathing.  She noted chest discomfort during emotional stress/distress.  She denied chest discomfort during physical activity.  Her blood pressure was slightly elevated at 1 40-50 during her virtual follow-up visit.  She was instructed to monitor her blood pressures and follow-up.  She called back on 11/17/2018 1 in the afternoon and indicated that her blood pressure was running 125-130 with heart rates in the  60s-70s.  She was out shopping with her husband on 11/18/2019 and developed substernal chest pain.  She took nitroglycerin and aspirin.  She was transported via EMS to the emergency department.  She stayed overnight and was not seen.  She chose to leave the emergency department without being seen.  EKG showed normal sinus rhythm with right bundle branch block 63 bpm.  Troponins were low and flat.  CBC shows slightly elevated WBCs, BMP unremarkable.  Chest x-ray showed no acute findings.  She presents to the clinic today for follow-up evaluation and states she was shopping and developed substernal chest pain.  It was not relieved by nitroglycerin and radiated to her left pectoral region.  She underwent testing in the emergency department and was not seen in 20 hours.  She presented to the clinic today and states that she has had occasional intermittent periods of sharp chest pain that are relieved with rest.  She has no exertional chest pain.  She states that prior to her incident while she was shopping she had no other episodes of chest discomfort.  She remains somewhat active shopping 3-4 times a week doing significant amounts of walking.  She did indicate that she has some increased stress hearing that 3 days prior to her chest discomfort 3 individuals in her area had died from Covid.  I will order an echocardiogram, nuclear stress test, have her increase her physical activity as tolerated, follow a low-sodium diet and follow-up in 1 month.  Today she denies chest pain, shortness of breath, lower extremity edema, fatigue, palpitations, melena, hematuria, hemoptysis, diaphoresis, weakness, presyncope, syncope, orthopnea, and PND.   Home Medications    Prior to Admission medications   Medication Sig Start Date End Date Taking? Authorizing Provider  acetaminophen (TYLENOL) 500 MG tablet Take 500 mg by mouth every 6 (six) hours as needed for mild pain or headache.    [provider]  alendronate  (FOSAMAX) 70 MG tablet Take 1 tablet (70 mg total) by mouth every 7 (seven) days. Take with a full glass of water on an empty stomach. 05/28/19   Copland, [name], MD  aspirin EC 81 MG EC tablet Take 1 tablet (81 mg total) by mouth daily. 08/30/16   Swinteck, Arlys John, MD  calcium carbonate (OS-CAL) 1250 (500 Ca) MG chewable tablet Chew 1 tablet by mouth daily.    [provider]  cetirizine (ZYRTEC) 10 MG chewable tablet Chew 10 mg by mouth daily as needed for allergies.    [provider]  cholecalciferol (VITAMIN D) 1000 UNITS tablet Take 1,000 Units by mouth daily.    [provider]  clopidogrel (PLAVIX) 75 MG tablet TAKE 1 TABLET BY MOUTH DAILY. 03/22/19   Hilty, Lisette Abu, MD  cyclobenzaprine (FLEXERIL) 10 MG tablet TAKE 1 TABLET BY MOUTH TWICE DAILY AS NEEDED FOR MUSCLE SPASMS 05/08/18   Copland, [name], MD  diazepam (VALIUM) 2 MG tablet  TAKE 1 TABLET BY MOUTH EVERY 8 HOURS AS NEEDED FOR MUSCLE SPASMS 10/26/19   Copland, Gwenlyn FoundJessica C, MD  diclofenac sodium (VOLTAREN) 1 % GEL Apply 2 g topically 4 (four) times daily. 02/01/18   Joseph ArtVann, Jessica U, DO  famotidine (PEPCID) 20 MG tablet TAKE 1 TABLET BY MOUTH 2 TIMES DAILY. Patient taking differently: Take 20 mg by mouth daily.  07/25/18   Hilty, Lisette AbuKenneth C, MD  FLUoxetine (PROZAC) 20 MG capsule Take 1 capsule (20 mg total) by mouth daily. 11/16/18   Copland, Gwenlyn FoundJessica C, MD  fluticasone (FLONASE) 50 MCG/ACT nasal spray Place 1 spray into both nostrils daily as needed for allergies or rhinitis.    [provider]  Icosapent Ethyl (VASCEPA) 1 g CAPS Take 2 capsules (2 g total) by mouth 2 (two) times daily. 11/18/18   Copland, Gwenlyn FoundJessica C, MD  metFORMIN (GLUCOPHAGE-XR) 500 MG 24 hr tablet Take 1 tablet (500 mg total) by mouth daily with breakfast. 09/28/19   Copland, Gwenlyn FoundJessica C, MD  methocarbamol (ROBAXIN) 500 MG tablet TAKE 1 TABLET (500 MG TOTAL) BY MOUTH EVERY 8 (EIGHT) HOURS AS NEEDED FOR MUSCLE SPASMS. 08/20/19   Copland, Gwenlyn FoundJessica C,  MD  metoprolol succinate (TOPROL-XL) 25 MG 24 hr tablet Take 0.5 tablets (12.5 mg total) by mouth daily. 11/16/18   Copland, Gwenlyn FoundJessica C, MD  Multiple Vitamin (MULTIVITAMIN WITH MINERALS) TABS tablet Take 1 tablet by mouth daily.    [provider]  nitroGLYCERIN (NITROSTAT) 0.4 MG SL tablet PLACE 1 TABLET UNDER THE TONGUE EVERY 5 MINUTES FOR 3 DOSES AS NEEDED FOR CHEST PAIN. 11/03/18   Hilty, Lisette AbuKenneth C, MD  simvastatin (ZOCOR) 40 MG tablet Take 1 tablet (40 mg total) by mouth daily. 09/28/19   Copland, Gwenlyn FoundJessica C, MD  telmisartan (MICARDIS) 40 MG tablet Take 1 tablet (40 mg total) by mouth every morning. NEED OV. 11/03/19   Hilty, Lisette AbuKenneth C, MD    Family History    Family History  Problem Relation Age of Onset  . Coronary artery disease Mother   . Rheum arthritis Mother   . Dementia Mother   . Heart attack Father   . Hypertension Father   . Hyperlipidemia Father   . Other Father        MVA  . Hypertension Brother   . Cancer Brother   . Heart disease Brother   . Cancer Paternal Grandmother        stomach  . Diabetes Paternal Grandfather   . Breast cancer Neg Hx    She indicated that her mother is deceased. She indicated that her father is deceased. She indicated that both of her sisters are alive. She indicated that two of her three brothers are alive. She indicated that her maternal grandmother is deceased. She indicated that her maternal grandfather is deceased. She indicated that her paternal grandmother is deceased. She indicated that her paternal grandfather is deceased. She indicated that her daughter is alive. She indicated that her son is alive. She indicated that the status of her neg hx is unknown.  Social History    Social History   Socioeconomic History  . Marital status: Married    Spouse name: [name]  . Number of children: 2  . Years of education: MSN  . Highest education level: Not on file  Occupational History  . Occupation: DIRECTOR    Employer: [employer]  Tobacco Use  . Smoking status: Never Smoker  . Smokeless tobacco: Never Used  Vaping Use  .  Vaping Use: Never used  Substance and Sexual Activity  . Alcohol use: Yes    Comment: occasional, 1 a month wine  . Drug use: No  . Sexual activity: Yes  Other Topics Concern  . Not on file  Social History Narrative      Married to [place]. Education: [employer]. Exercise: Yes   Patient lives at home with her spouse. retired   Caffeine use: 1 drink of tea a day   Social Determinants of Corporate investment banker Strain:   . Difficulty of Paying Living Expenses: Not on file  Food Insecurity:   . Worried About Programme researcher, broadcasting/film/video in the Last Year: Not on file  . Ran Out of Food in the Last Year: Not on file  Transportation Needs:   . Lack of Transportation (Medical): Not on file  . Lack of Transportation (Non-Medical): Not on file  Physical Activity:   . Days of Exercise per Week: Not on file  . Minutes of Exercise per Session: Not on file  Stress:   . Feeling of Stress : Not on file  Social Connections:   . Frequency of Communication with Friends and Family: Not on file  . Frequency of Social Gatherings with Friends and Family: Not on file  . Attends Religious Services: Not on file  . Active Member of Clubs or Organizations: Not on file  . Attends Banker Meetings: Not on file  . Marital Status: Not on file  Intimate Partner Violence:   . Fear of Current or Ex-Partner: Not on file  . Emotionally Abused: Not on file  . Physically Abused: Not on file  . Sexually Abused: Not on file     Review of Systems    General:  No chills, fever, night sweats or weight changes.  Cardiovascular:  No chest pain, dyspnea on exertion, edema, orthopnea, palpitations, paroxysmal nocturnal dyspnea. Dermatological: No rash, lesions/masses Respiratory: No cough, dyspnea Urologic: No hematuria, dysuria Abdominal:   No nausea, vomiting, diarrhea, bright red blood per rectum,  melena, or hematemesis Neurologic:  No visual changes, wkns, changes in mental status. All other systems reviewed and are otherwise negative except as noted above.  Physical Exam    VS:  BP 116/66 (BP Location: Left Arm, Patient Position: Sitting, Cuff Size: Normal)   Pulse 62   Ht  (1.549 m)   Wt 209 lb 3.2 oz (94.9 kg)   LMP  (LMP Unknown)   SpO2 96%   BMI 39.53 kg/m  , BMI Body mass index is 39.53 kg/m. GEN: Well nourished, well developed, in no acute distress. HEENT: normal. Neck: Supple, no JVD, carotid bruits, or masses. Cardiac: RRR, no murmurs, rubs, or gallops. No clubbing, cyanosis, edema.  Radials/DP/PT 2+ and equal bilaterally.  Respiratory:  Respirations regular and unlabored, clear to auscultation bilaterally. GI: Soft, nontender, nondistended, BS + x 4. MS: no deformity or atrophy. Skin: warm and dry, no rash. Neuro:  Strength and sensation are intact. Psych: Normal affect.  Accessory Clinical Findings    Recent Labs: 07/01/2019: ALT 24; TSH 2.35 11/18/2019: BUN 17; Creatinine, Ser 0.81; Hemoglobin 12.1; Platelets 337; Potassium 4.1; Sodium 139   Recent Lipid Panel    Component Value Date/Time   CHOL 116 07/01/2019 0921   TRIG 154.0 (H) 07/01/2019 0921   HDL 31.90 (L) 07/01/2019 0921   CHOLHDL 4 07/01/2019 0921   VLDL 30.8 07/01/2019 0921   LDLCALC 53 07/01/2019 0921   LDLDIRECT 82.0 11/16/2018 1010  ECG personally reviewed by me today-normal sinus rhythm right bundle branch block 64 bpm- No acute changes  Cardiac event monitor 02/23/2016 Monitor shows sinus rhythm and sinus bradycardia. Symptoms of chest pressure reported, however, they do not correlate with the monitor.  Chrystie Nose, MD, [facility] Attending Cardiologist [facility]  EKG 11/19/2019 Normal sinus rhythm right bundle branch block 63 bpm   Assessment & Plan   1.  Coronary artery disease-no chest pain today.  Has only had intermittent twinges of chest pain lasting minutes  and relieved with rest since being in the emergency department.  Recently presented to emergency department on 11/18/2019 with substernal chest pain.  Patient was out shopping took nitroglycerin and aspirin.  She presented to the emergency department via EMS.  Troponins low and flat, no acute changes on EKG, CXR unremarkable.  PCI 2014 showed patent stent.  Nuclear stress test 2018 low risk. Continue Plavix, aspirin, metoprolol, nitroglycerin Heart healthy low-sodium diet-salty 6 given Increase physical activity as tolerated Order echocardiogram, nuclear stress test  Essential hypertension-BP today 116/66.  Well-controlled at home. Continue metoprolol, Micardis Heart healthy low-sodium diet-salty 6 given Increase physical activity as tolerated  Hyperlipidemia-07/01/2019: Cholesterol 116; HDL 31.90; LDL Cholesterol 53; Triglycerides 154.0; VLDL 30.8 Continue Zocor, Vascepa Heart healthy low-sodium high-fiber diet.   Increase physical activity as tolerated  Diabetes mellitus type 2-A1c 6.8 on 07/01/2019 Continue Metformin Heart healthy low-sodium diet-salty 6 given Increase physical activity as tolerated  Obstructive sleep apnea-compliant with CPAP therapy. Continue CPAP  Disposition: Follow-up with Dr. Rennis Golden or myself in 1 months.   Thomasene Ripple. Sephora Boyar NP-C    11/24/2019, 3:06 PM [facility] [address] Office [phone] Fax [phone]  Notice: This dictation was prepared with Dragon dictation along with smaller phrase technology. Any transcriptional errors that result from this process are unintentional and may not be corrected upon review.

## 2019-11-24 ENCOUNTER — Ambulatory Visit: Payer: PPO | Admitting: General Practice

## 2019-11-24 ENCOUNTER — Other Ambulatory Visit: Payer: Self-pay

## 2019-11-24 ENCOUNTER — Telehealth: Payer: PPO

## 2019-11-24 ENCOUNTER — Encounter: Payer: Self-pay | Admitting: General Practice

## 2019-11-24 ENCOUNTER — Encounter: Payer: Self-pay | Admitting: Internal Medicine

## 2019-11-24 VITALS — BP 116/66 | HR 62 | Ht 61.0 in | Wt 209.2 lb

## 2019-11-24 DIAGNOSIS — I2510 Atherosclerotic heart disease of native coronary artery without angina pectoris: Secondary | ICD-10-CM | POA: Diagnosis not present

## 2019-11-24 DIAGNOSIS — Z9989 Dependence on other enabling machines and devices: Secondary | ICD-10-CM | POA: Diagnosis not present

## 2019-11-24 DIAGNOSIS — I10 Essential (primary) hypertension: Secondary | ICD-10-CM | POA: Diagnosis not present

## 2019-11-24 DIAGNOSIS — E118 Type 2 diabetes mellitus with unspecified complications: Secondary | ICD-10-CM | POA: Diagnosis not present

## 2019-11-24 DIAGNOSIS — G4733 Obstructive sleep apnea (adult) (pediatric): Secondary | ICD-10-CM

## 2019-11-24 DIAGNOSIS — E785 Hyperlipidemia, unspecified: Secondary | ICD-10-CM

## 2019-11-24 DIAGNOSIS — I2583 Coronary atherosclerosis due to lipid rich plaque: Secondary | ICD-10-CM

## 2019-11-24 DIAGNOSIS — R072 Precordial pain: Secondary | ICD-10-CM

## 2019-11-24 NOTE — Patient Instructions (Signed)
Medication Instructions:  The current medical regimen is effective;  continue present plan and medications as directed. Please refer to the Current Medication list given to you today.  *If you need a refill on your cardiac medications before your next appointment, please call your pharmacy*  Lab Work: NONE  Testing/Procedures: Echocardiogram - Your physician has requested that you have an echocardiogram. Echocardiography is a painless test that uses sound waves to create images of your heart. It provides your doctor with information about the size and shape of your heart and how well your heart's chambers and valves are working. This procedure takes approximately one hour. There are no restrictions for this procedure. This will be performed at our [facility] location - [address], [address].  Your physician has requested that you have a lexiscan myoview. A cardiac stress test is a cardiological test that measures the heart's ability to respond to external stress in a controlled clinical environment. The stress response is induced by intravenous pharmacological stimulation. PLEASE HOLD: METOPROLOL the day of the testing  Special Instructions PLEASE READ AND FOLLOW SALTY 6-ATTACHED  PLEASE INCREASE PHYSICAL ACTIVITY AS TOLERATED  Follow-Up: Your next appointment:  1 month(s)   In Person with K. [place] Hilty, MD -OR- JESSE CLEAVER, FNP-C  At [facility], you and your health needs are our priority.  As part of our continuing mission to provide you with exceptional heart care, we have created designated Provider Care Teams.  These Care Teams include your primary Cardiologist (physician) and Advanced Practice Providers (APPs -  Physician Assistants and Nurse Practitioners) who all work together to provide you with the care you need, when you need it.

## 2019-11-25 ENCOUNTER — Telehealth: Payer: Self-pay | Admitting: Pharmacist

## 2019-11-25 NOTE — Progress Notes (Addendum)
Chronic Care Management Pharmacy Assistant   Name: [name]  MRN: [id number] DOB: [birth year]  Reason for Encounter: Disease State  Patient Questions:  1.  Have you seen any other providers since your last visit? Yes  2.  Any changes in your medicines or health? No   PCP : Copland, [name], MD   Their chronic conditions include: DM, HTN, HLD, CAD, Depression, GERD, Osteopenia, Obesity, Allergic Rhinitis, Pain  Ov's since their last visit with the clinical pharmacist.  Consults: 11-17-2019 (Cardiologist) Video Visit. Patient presented for follow up. Patient reported atypical chest pain only driven by emotional stress, does not occur with physical activity. No medication changes.  11-24-2019 (Cardiologist) Patient presented in the office for an evaluation of atypical chest pain. Patient reported going to the ED on 11-18-2019 for  substernal chest pain, but was not seen and chose to leave the ED without being seen.  Hospitalizations: 11-18-2019 (ED) Patient was transported to [facility] via EMS c/o shortness of breath and chest pain. Blood pressure recorded at the ED was 155/54. Imaging impression indicates no active cardiopulmonary disease.     Allergies:   Allergies  Allergen Reactions  . Niacin And Related Other (See Comments)    Whelps and skin flushed, mouth tingling    Medications: Outpatient Encounter Medications as of 11/25/2019  Medication Sig Note  . acetaminophen (TYLENOL) 500 MG tablet Take 500 mg by mouth every 6 (six) hours as needed for mild pain or headache.   . alendronate (FOSAMAX) 70 MG tablet Take 1 tablet (70 mg total) by mouth every 7 (seven) days. Take with a full glass of water on an empty stomach.   Marland Kitchen aspirin EC 81 MG EC tablet Take 1 tablet (81 mg total) by mouth daily.   . calcium carbonate (OS-CAL) 1250 (500 Ca) MG chewable tablet Chew 1 tablet by mouth daily.   . cetirizine (ZYRTEC) 10 MG chewable tablet Chew 10 mg by mouth daily as needed for  allergies.   . cholecalciferol (VITAMIN D) 1000 UNITS tablet Take 1,000 Units by mouth daily.   . clopidogrel (PLAVIX) 75 MG tablet TAKE 1 TABLET BY MOUTH DAILY.   . cyclobenzaprine (FLEXERIL) 10 MG tablet TAKE 1 TABLET BY MOUTH TWICE DAILY AS NEEDED FOR MUSCLE SPASMS 05/27/2019: Last used 01/2019  . diazepam (VALIUM) 2 MG tablet TAKE 1 TABLET BY MOUTH EVERY 8 HOURS AS NEEDED FOR MUSCLE SPASMS   . diclofenac sodium (VOLTAREN) 1 % GEL Apply 2 g topically 4 (four) times daily.   . famotidine (PEPCID) 20 MG tablet TAKE 1 TABLET BY MOUTH 2 TIMES DAILY. (Patient taking differently: Take 20 mg by mouth daily. )   . FLUoxetine (PROZAC) 20 MG capsule TAKE 1 CAPSULE BY MOUTH ONCE DAILY   . fluticasone (FLONASE) 50 MCG/ACT nasal spray Place 1 spray into both nostrils daily as needed for allergies or rhinitis.   Bess Harvest Ethyl (VASCEPA) 1 g CAPS Take 2 capsules (2 g total) by mouth 2 (two) times daily.   . metFORMIN (GLUCOPHAGE-XR) 500 MG 24 hr tablet Take 1 tablet (500 mg total) by mouth daily with breakfast.   . methocarbamol (ROBAXIN) 500 MG tablet TAKE 1 TABLET (500 MG TOTAL) BY MOUTH EVERY 8 (EIGHT) HOURS AS NEEDED FOR MUSCLE SPASMS.   . metoprolol succinate (TOPROL-XL) 25 MG 24 hr tablet Take 0.5 tablets (12.5 mg total) by mouth daily.   . Multiple Vitamin (MULTIVITAMIN WITH MINERALS) TABS tablet Take 1 tablet by mouth daily.   Marland Kitchen  nitroGLYCERIN (NITROSTAT) 0.4 MG SL tablet PLACE 1 TABLET UNDER THE TONGUE EVERY 5 MINUTES FOR 3 DOSES AS NEEDED FOR CHEST PAIN. 05/27/2019: Hasn't had to use in past year  . simvastatin (ZOCOR) 40 MG tablet Take 1 tablet (40 mg total) by mouth daily.   Marland Kitchen telmisartan (MICARDIS) 40 MG tablet Take 1 tablet (40 mg total) by mouth every morning. NEED OV.    No facility-administered encounter medications on file as of 11/25/2019.    Current Diagnosis: Patient Active Problem List   Diagnosis Date Noted  . Obstructive sleep apnea treated with continuous positive airway pressure  (CPAP) 05/07/2018  . Chest pain 01/31/2018  . Morbid obesity (HCC) 11/03/2017  . Coronary artery disease involving native coronary artery of native heart with unstable angina pectoris (HCC) 11/03/2017  . Insomnia 11/03/2017  . Inadequate sleep hygiene 11/03/2017  . Anxiety 07/21/2017  . Dizziness 10/02/2016  . Right patella fracture 08/29/2016  . Acquired foot deformity, left 07/18/2016  . Osteopenia 11/10/2015  . HNP (herniated nucleus pulposus), lumbar 10/12/2014  . Spinal stenosis at L4-L5 level 10/12/2014  . Iron deficiency anemia 07/29/2014  . DM (diabetes mellitus) with complications (HCC) 07/29/2014  . Environmental and seasonal allergies 04/28/2014  . Spinal stenosis, lumbar region, with neurogenic claudication 01/06/2013  . Obesity, morbid, BMI 40.0-49.9 (HCC) 10/20/2012  . Chest pain with moderate risk of acute coronary syndrome 10/02/2012  . CAD S/P percutaneous coronary angioplasty   . Hyperlipidemia with target LDL less than 70   . Essential hypertension 04/16/2012    Goals Addressed   None    Reviewed chart prior to disease state call. Spoke with patient regarding BP  Recent Office Vitals: BP Readings from Last 3 Encounters:  11/24/19 116/66  11/18/19 129/80  07/01/19 140/80   Pulse Readings from Last 3 Encounters:  11/24/19 62  11/18/19 (!) 52  07/01/19 (!) 57    Wt Readings from Last 3 Encounters:  11/24/19 209 lb 3.2 oz (94.9 kg)  11/17/19 206 lb (93.4 kg)  07/01/19 208 lb (94.3 kg)     Kidney Function Lab Results  Component Value Date/Time   CREATININE 0.81 11/18/2019 02:18 PM   CREATININE 0.75 07/01/2019 09:21 AM   CREATININE 0.82 09/29/2014 09:10 AM   CREATININE 0.73 09/27/2013 08:46 AM   GFR 76.46 07/01/2019 09:21 AM   GFRNONAA >60 11/18/2019 02:18 PM   GFRAA >60 11/18/2019 02:18 PM    BMP Latest Ref Rng & Units 11/18/2019 07/01/2019 11/16/2018  Glucose 70 - 99 mg/dL 397(Q) 734(L) 937(T)  BUN 8 - 23 mg/dL 17 17 14   Creatinine 0.44 - 1.00  mg/dL 0.24 0.97  Sodium 135 - 145 mmol/L 139 139 140  Potassium 3.5 - 5.1 mmol/L 4.1 4.1 4.7  Chloride 98 - 111 mmol/L 103 105 102  CO2 22 - 32 mmol/L 24 25 30   Calcium 8.9 - 10.3 mg/dL 9.7 9.3 3.53    . Current antihypertensive regimen:  o Telmisartan 40mg  daily, metoprolol succinate 25mg  1/2 tab daily . How often are you checking your Blood Pressure? 1-2x per week . Current home BP readings: 116/66 (last reading from cardiologist on 11-24-2019) . What recent interventions/DTPs have been made by any provider to improve Blood Pressure control since last CPP Visit: none . Any recent hospitalizations or ED visits since last visit with CPP? Yes . What diet changes have been made to improve Blood Pressure Control?  o Patient states she has been on a diet plan that delivers meals to  her home, and she drinks Slim Fast at times. Patient reports weight loss. . What exercise is being done to improve your Blood Pressure Control?  o Patient states she was being more independent with physical activity. She goes out shopping to [employer] and [employer]. She also rides a stationary bike.    Adherence Review: Is the patient currently on ACE/ARB medication? Yes Does the patient have >5 day gap between last estimated fill dates? No    Follow-Up:  Scheduled Follow-Up With Clinical Pharmacist on Thursday December 30, 2019 @ 1pm   Corwin Levins, [place] Clinical Pharmacist Assistant [phone]  Reviewed by: Katrinka Blazing, PharmD Clinical Pharmacist [facility] [phone]

## 2019-11-26 ENCOUNTER — Telehealth (HOSPITAL_COMMUNITY): Payer: Self-pay

## 2019-11-26 NOTE — Telephone Encounter (Signed)
Encounter complete. 

## 2019-11-30 ENCOUNTER — Other Ambulatory Visit: Payer: Self-pay | Admitting: Family Medicine

## 2019-11-30 ENCOUNTER — Other Ambulatory Visit: Payer: Self-pay

## 2019-11-30 DIAGNOSIS — F411 Generalized anxiety disorder: Secondary | ICD-10-CM

## 2019-11-30 MED FILL — TELMISARTAN 40 MG TABS: 40 | 30 days supply | Qty: 30 | Fill #1

## 2019-12-01 ENCOUNTER — Ambulatory Visit (HOSPITAL_COMMUNITY)
Admission: RE | Admit: 2019-12-01 | Discharge: 2019-12-01 | Disposition: A | Discharge status: Home / Self Care | Payer: PPO | Source: Ambulatory Visit | Attending: Cardiovascular Disease | Admitting: Cardiovascular Disease

## 2019-12-01 ENCOUNTER — Other Ambulatory Visit: Payer: Self-pay

## 2019-12-01 DIAGNOSIS — R072 Precordial pain: Secondary | ICD-10-CM

## 2019-12-01 LAB — MYOCARDIAL PERFUSION IMAGING
LV dias vol: 97 mL (ref 46–106)
LV sys vol: 31 mL
Peak HR: 82 {beats}/min
Rest HR: 53 {beats}/min
SDS: 4
SRS: 3
SSS: 7
TID: 1.02

## 2019-12-01 MED ORDER — AMINOPHYLLINE 25 MG/ML IV SOLN
75.0000 mg | Freq: Once | INTRAVENOUS | Status: AC
  Administered 2019-12-01: 75 mg via INTRAVENOUS

## 2019-12-01 MED ORDER — REGADENOSON 0.4 MG/5ML IV SOLN
0.4000 mg | Freq: Once | INTRAVENOUS | Status: AC
  Administered 2019-12-01: 0.4 mg via INTRAVENOUS

## 2019-12-01 MED ORDER — TECHNETIUM TC 99M TETROFOSMIN IV KIT
30.6000 | PACK | Freq: Once | INTRAVENOUS | Status: AC | PRN
  Administered 2019-12-01: 30.6 via INTRAVENOUS
  Filled 2019-12-01: qty 31

## 2019-12-01 MED ORDER — TECHNETIUM TC 99M TETROFOSMIN IV KIT
10.2000 | PACK | Freq: Once | INTRAVENOUS | Status: AC | PRN
  Administered 2019-12-01: 10.2 via INTRAVENOUS
  Filled 2019-12-01: qty 11

## 2019-12-01 MED FILL — diazePAM 2 MG TABS: 2 | 10 days supply | Qty: 30 | Fill #0

## 2019-12-02 ENCOUNTER — Encounter: Payer: Self-pay | Admitting: Family Medicine

## 2019-12-03 ENCOUNTER — Telehealth: Payer: Self-pay | Admitting: General Practice

## 2019-12-03 NOTE — Telephone Encounter (Signed)
Patient contacted to review nuclear stress test results. She expressed understanding. We will proceed to schedule cardiac catheterization at this time. Risks were reviewed. She agrees to proceed with cardiac catheterization. No further questions at this time.  The patient understands that risks include but are not limited to stroke (1 in 1000), death (1 in 1000), kidney failure [usually temporary] (1 in 500), bleeding (1 in 200), allergic reaction [possibly serious] (1 in 200), and agrees to proceed.

## 2019-12-06 ENCOUNTER — Encounter: Payer: Self-pay | Admitting: Family Medicine

## 2019-12-06 ENCOUNTER — Telehealth: Payer: Self-pay | Admitting: General Practice

## 2019-12-06 MED ORDER — SODIUM CHLORIDE 0.9% FLUSH: 3.0000 mL | Freq: Two times a day (BID) | INTRAVENOUS | Status: DC

## 2019-12-06 NOTE — Addendum Note (Signed)
Addended by: Ronney Asters on: 12/06/2019 09:38 AM   Modules accepted: Orders, SmartSet

## 2019-12-06 NOTE — Telephone Encounter (Signed)
Pt is calling to schedule her cardiac catheterization. Please call back to proceed

## 2019-12-06 NOTE — Telephone Encounter (Signed)
  [facility] CARDIOVASCULAR DIVISION [facility] [address] [place] [address] Dept: [phone] Loc: [phone]  [name]  12/06/2019  You are scheduled for a Cardiac Catheterization on Friday, September 17 with Dr. Verdis Prime.  1. Please arrive at the [facility] (Main Entrance A) at [facility]: [address] [place], [place] [address] at 5:30 AM (This time is two hours before your procedure to ensure your preparation). Free valet parking service is available.   Special note: Every effort is made to have your procedure done on time. Please understand that emergencies sometimes delay scheduled procedures.  2. Diet: Do not eat solid foods after midnight.  The patient may have clear liquids until 5am upon the day of the procedure.  3. Labs: ALREADY DONE.  COVID TESTING: 12-07-2019 @905AM  [address], [place], [place]   4. Medication instructions in preparation for your procedure:  Do not take Diabetes Med Glucophage (Metformin) on the day of the procedure and HOLD 48 HOURS AFTER THE PROCEDURE.  On the morning of your procedure, take your Plavix/Clopidogrel, ASPRIN and any morning medicines NOT listed above.  You may use sips of water.  5. Plan for one night stay--bring personal belongings. 6. Bring a current list of your medications and current insurance cards. 7. You MUST have a responsible person to drive you home. 8. Someone MUST be with you the first 24 hours after you arrive home or your discharge will be delayed. 9. Please wear clothes that are easy to get on and off and wear slip-on shoes.  Thank you for allowing to care for you!   -- [facility]   PT VERBALIZED UNDERSTANDING-NO QUESTIONS SHE WILL ARRIVE TOMORROW 12-07-2019 FOR COVID TESTING AND ARRIVE AT [facility] AT 530AM FRIDAY 12-10-2019

## 2019-12-07 ENCOUNTER — Other Ambulatory Visit (HOSPITAL_COMMUNITY)
Admission: RE | Admit: 2019-12-07 | Discharge: 2019-12-07 | Disposition: A | Discharge status: Home / Self Care | Payer: PPO | Source: Ambulatory Visit | Attending: Interventional Cardiology | Admitting: Interventional Cardiology

## 2019-12-07 DIAGNOSIS — Z20822 Contact with and (suspected) exposure to covid-19: Secondary | ICD-10-CM | POA: Diagnosis not present

## 2019-12-07 DIAGNOSIS — Z01812 Encounter for preprocedural laboratory examination: Secondary | ICD-10-CM | POA: Insufficient documentation

## 2019-12-07 LAB — SARS CORONAVIRUS 2 (TAT 6-24 HRS): SARS Coronavirus 2: NEGATIVE

## 2019-12-08 ENCOUNTER — Other Ambulatory Visit: Payer: Self-pay | Admitting: Family Medicine

## 2019-12-08 DIAGNOSIS — I10 Essential (primary) hypertension: Secondary | ICD-10-CM

## 2019-12-08 MED FILL — METOPROLOL SUCCINATE ER 25: 25 | 90 days supply | Qty: 45 | Fill #0

## 2019-12-09 ENCOUNTER — Telehealth: Payer: Self-pay | Admitting: *Deleted

## 2019-12-09 NOTE — H&P (Signed)
   Recent atypical chest pain.  No imaging or functional eval.  H/O LAD Stent 2010

## 2019-12-09 NOTE — Telephone Encounter (Signed)
Pt contacted pre-catheterization scheduled at [facility] for: Friday December 10, 2019 7:30 AM Verified arrival time and place: [facility] Main Entrance A [facility]) at: 5:30 AM   No solid food after midnight prior to cath, clear liquids until 5 AM day of procedure.  Hold: Metformin-none day of procedure and 48 hours post procedure   Except hold medication AM meds can be  taken pre-cath with sips of water including: ASA 81 mg Plavix 75 mg  Confirmed patient has responsible adult to drive home post procedure and be with patient first 24 hours after arriving home: yes  You are allowed ONE visitor in the waiting room during the time you are at the hospital for your procedure. Both you and your visitor must wear a mask once you enter the hospital.       COVID-19 Pre-Screening Questions:  . In the past 10 days have you had a new cough, shortness of breath, headache, congestion, fever (100 or greater) unexplained body aches, new sore throat, or sudden loss of taste or sense of smell? no . In the past 10 days have you been around anyone with known Covid 19? no . Have you been vaccinated for COVID-19? Yes, see immunization history  Reviewed procedure/mask/visitor instructions, COVID-19 questions with patient.

## 2019-12-10 ENCOUNTER — Telehealth: Payer: Self-pay | Admitting: Internal Medicine

## 2019-12-10 ENCOUNTER — Ambulatory Visit (HOSPITAL_COMMUNITY)
Admission: RE | Admit: 2019-12-10 | Discharge: 2019-12-10 | Disposition: A | Discharge status: Home / Self Care | Payer: PPO | Attending: Interventional Cardiology | Admitting: Interventional Cardiology

## 2019-12-10 ENCOUNTER — Encounter (HOSPITAL_COMMUNITY): Payer: Self-pay | Admitting: Interventional Cardiology

## 2019-12-10 ENCOUNTER — Encounter (HOSPITAL_COMMUNITY)
Admission: RE | Disposition: A | Discharge status: Home / Self Care | Payer: Self-pay | Source: Home / Self Care | Attending: Interventional Cardiology

## 2019-12-10 DIAGNOSIS — E785 Hyperlipidemia, unspecified: Secondary | ICD-10-CM | POA: Diagnosis not present

## 2019-12-10 DIAGNOSIS — I25110 Atherosclerotic heart disease of native coronary artery with unstable angina pectoris: Secondary | ICD-10-CM | POA: Diagnosis not present

## 2019-12-10 DIAGNOSIS — G4733 Obstructive sleep apnea (adult) (pediatric): Secondary | ICD-10-CM | POA: Diagnosis not present

## 2019-12-10 DIAGNOSIS — K219 Gastro-esophageal reflux disease without esophagitis: Secondary | ICD-10-CM | POA: Insufficient documentation

## 2019-12-10 DIAGNOSIS — R079 Chest pain, unspecified: Secondary | ICD-10-CM | POA: Diagnosis present

## 2019-12-10 DIAGNOSIS — Z7902 Long term (current) use of antithrombotics/antiplatelets: Secondary | ICD-10-CM | POA: Insufficient documentation

## 2019-12-10 DIAGNOSIS — Z6839 Body mass index (BMI) 39.0-39.9, adult: Secondary | ICD-10-CM | POA: Diagnosis not present

## 2019-12-10 DIAGNOSIS — Z79899 Other long term (current) drug therapy: Secondary | ICD-10-CM | POA: Diagnosis not present

## 2019-12-10 DIAGNOSIS — I2510 Atherosclerotic heart disease of native coronary artery without angina pectoris: Secondary | ICD-10-CM

## 2019-12-10 DIAGNOSIS — I25119 Atherosclerotic heart disease of native coronary artery with unspecified angina pectoris: Secondary | ICD-10-CM

## 2019-12-10 DIAGNOSIS — Z7984 Long term (current) use of oral hypoglycemic drugs: Secondary | ICD-10-CM | POA: Diagnosis not present

## 2019-12-10 DIAGNOSIS — E1159 Type 2 diabetes mellitus with other circulatory complications: Secondary | ICD-10-CM | POA: Diagnosis present

## 2019-12-10 DIAGNOSIS — E669 Obesity, unspecified: Secondary | ICD-10-CM | POA: Insufficient documentation

## 2019-12-10 DIAGNOSIS — I152 Hypertension secondary to endocrine disorders: Secondary | ICD-10-CM | POA: Diagnosis present

## 2019-12-10 DIAGNOSIS — I10 Essential (primary) hypertension: Secondary | ICD-10-CM | POA: Insufficient documentation

## 2019-12-10 DIAGNOSIS — E1136 Type 2 diabetes mellitus with diabetic cataract: Secondary | ICD-10-CM | POA: Insufficient documentation

## 2019-12-10 DIAGNOSIS — Z7982 Long term (current) use of aspirin: Secondary | ICD-10-CM | POA: Insufficient documentation

## 2019-12-10 DIAGNOSIS — Z955 Presence of coronary angioplasty implant and graft: Secondary | ICD-10-CM | POA: Diagnosis not present

## 2019-12-10 DIAGNOSIS — E118 Type 2 diabetes mellitus with unspecified complications: Secondary | ICD-10-CM | POA: Diagnosis present

## 2019-12-10 DIAGNOSIS — E1169 Type 2 diabetes mellitus with other specified complication: Secondary | ICD-10-CM | POA: Diagnosis present

## 2019-12-10 DIAGNOSIS — E119 Type 2 diabetes mellitus without complications: Secondary | ICD-10-CM | POA: Diagnosis present

## 2019-12-10 HISTORY — PX: LEFT HEART CATH AND CORONARY ANGIOGRAPHY: CATH118249

## 2019-12-10 LAB — GLUCOSE, CAPILLARY
Glucose-Capillary: 134 mg/dL — ABNORMAL HIGH (ref 70–99)
Glucose-Capillary: 171 mg/dL — ABNORMAL HIGH (ref 70–99)

## 2019-12-10 SURGERY — LEFT HEART CATH AND CORONARY ANGIOGRAPHY
Anesthesia: LOCAL

## 2019-12-10 MED ORDER — VERAPAMIL HCL 2.5 MG/ML IV SOLN
INTRAVENOUS | Status: DC | PRN
  Administered 2019-12-10: 10 mL via INTRA_ARTERIAL

## 2019-12-10 MED ORDER — LIDOCAINE HCL (PF) 1 % IJ SOLN
INTRAMUSCULAR | Status: DC | PRN
  Administered 2019-12-10: 2 mL via INTRADERMAL

## 2019-12-10 MED ORDER — VERAPAMIL HCL 2.5 MG/ML IV SOLN
INTRAVENOUS | Status: AC
  Filled 2019-12-10: qty 2

## 2019-12-10 MED ORDER — ACETAMINOPHEN 325 MG PO TABS: 650.0000 mg | ORAL_TABLET | ORAL | Status: DC | PRN

## 2019-12-10 MED ORDER — NITROGLYCERIN 1 MG/10 ML FOR IR/CATH LAB
INTRA_ARTERIAL | Status: DC | PRN
  Administered 2019-12-10: 200 ug via INTRACORONARY

## 2019-12-10 MED ORDER — FENTANYL CITRATE (PF) 100 MCG/2ML IJ SOLN
INTRAMUSCULAR | Status: DC | PRN
Start: 2019-12-10 — End: 2019-12-10
  Administered 2019-12-10: 25 ug via INTRAVENOUS

## 2019-12-10 MED ORDER — HEPARIN SODIUM (PORCINE) 1000 UNIT/ML IJ SOLN
INTRAMUSCULAR | Status: AC
  Filled 2019-12-10: qty 1

## 2019-12-10 MED ORDER — SODIUM CHLORIDE 0.9 % WEIGHT BASED INFUSION: 1.0000 mL/kg/h | INTRAVENOUS | Status: DC

## 2019-12-10 MED ORDER — ATORVASTATIN CALCIUM 80 MG PO TABS: 80.0000 mg | ORAL_TABLET | Freq: Every day | ORAL | Status: DC

## 2019-12-10 MED ORDER — LABETALOL HCL 5 MG/ML IV SOLN: 10.0000 mg | INTRAVENOUS | Status: DC | PRN

## 2019-12-10 MED ORDER — SODIUM CHLORIDE 0.9% FLUSH: 3.0000 mL | INTRAVENOUS | Status: DC | PRN

## 2019-12-10 MED ORDER — ONDANSETRON HCL 4 MG/2ML IJ SOLN: 4.0000 mg | Freq: Four times a day (QID) | INTRAMUSCULAR | Status: DC | PRN

## 2019-12-10 MED ORDER — SODIUM CHLORIDE 0.9 % IV SOLN: INTRAVENOUS | Status: DC

## 2019-12-10 MED ORDER — ASPIRIN 81 MG PO CHEW: 81.0000 mg | CHEWABLE_TABLET | Freq: Every day | ORAL | Status: DC

## 2019-12-10 MED ORDER — HEPARIN SODIUM (PORCINE) 1000 UNIT/ML IJ SOLN
INTRAMUSCULAR | Status: DC | PRN
  Administered 2019-12-10: 5000 [IU] via INTRAVENOUS

## 2019-12-10 MED ORDER — MIDAZOLAM HCL 2 MG/2ML IJ SOLN
INTRAMUSCULAR | Status: DC | PRN
  Administered 2019-12-10: 1 mg via INTRAVENOUS

## 2019-12-10 MED ORDER — METHOCARBAMOL 500 MG PO TABS
500.0000 mg | ORAL_TABLET | Freq: Three times a day (TID) | ORAL | Status: DC | PRN
  Filled 2019-12-10: qty 1

## 2019-12-10 MED ORDER — LIDOCAINE HCL (PF) 1 % IJ SOLN
INTRAMUSCULAR | Status: AC
  Filled 2019-12-10: qty 30

## 2019-12-10 MED ORDER — NITROGLYCERIN 1 MG/10 ML FOR IR/CATH LAB
INTRA_ARTERIAL | Status: AC
  Filled 2019-12-10: qty 10

## 2019-12-10 MED ORDER — MIDAZOLAM HCL 2 MG/2ML IJ SOLN
INTRAMUSCULAR | Status: AC
  Filled 2019-12-10: qty 2

## 2019-12-10 MED ORDER — ASPIRIN 81 MG PO CHEW: 81.0000 mg | CHEWABLE_TABLET | ORAL | Status: DC

## 2019-12-10 MED ORDER — SODIUM CHLORIDE 0.9 % IV SOLN: 250.0000 mL | INTRAVENOUS | Status: DC | PRN

## 2019-12-10 MED ORDER — HEPARIN (PORCINE) IN NACL 1000-0.9 UT/500ML-% IV SOLN
INTRAVENOUS | Status: DC | PRN
  Administered 2019-12-10 (×2): 500 mL

## 2019-12-10 MED ORDER — SODIUM CHLORIDE 0.9 % WEIGHT BASED INFUSION
3.0000 mL/kg/h | INTRAVENOUS | Status: AC
  Administered 2019-12-10: 3 mL/kg/h via INTRAVENOUS

## 2019-12-10 MED ORDER — HEPARIN (PORCINE) IN NACL 1000-0.9 UT/500ML-% IV SOLN
INTRAVENOUS | Status: AC
  Filled 2019-12-10: qty 1000

## 2019-12-10 MED ORDER — SODIUM CHLORIDE 0.9% FLUSH: 3.0000 mL | Freq: Two times a day (BID) | INTRAVENOUS | Status: DC

## 2019-12-10 MED ORDER — HYDRALAZINE HCL 20 MG/ML IJ SOLN: 10.0000 mg | INTRAMUSCULAR | Status: DC | PRN

## 2019-12-10 MED ORDER — OXYCODONE HCL 5 MG PO TABS: 5.0000 mg | ORAL_TABLET | ORAL | Status: DC | PRN

## 2019-12-10 MED ORDER — FENTANYL CITRATE (PF) 100 MCG/2ML IJ SOLN
INTRAMUSCULAR | Status: AC
  Filled 2019-12-10: qty 2

## 2019-12-10 MED ORDER — IOHEXOL 350 MG/ML SOLN
INTRAVENOUS | Status: DC | PRN
  Administered 2019-12-10: 70 mL

## 2019-12-10 SURGICAL SUPPLY — 10 items
CATH 5FR JL3.5 JR4 ANG PIG MP (CATHETERS) ×1 IMPLANT
DEVICE RAD COMP TR BAND LRG (VASCULAR PRODUCTS) ×1 IMPLANT
GLIDESHEATH SLEND A-KIT 6F 22G (SHEATH) ×1 IMPLANT
GUIDEWIRE INQWIRE 1.5J.035X260 (WIRE) IMPLANT
INQWIRE 1.5J .035X260CM (WIRE) ×2
KIT HEART LEFT (KITS) ×2 IMPLANT
PACK CARDIAC CATHETERIZATION (CUSTOM PROCEDURE TRAY) ×2 IMPLANT
SHEATH PROBE COVER 6X72 (BAG) ×1 IMPLANT
TRANSDUCER W/STOPCOCK (MISCELLANEOUS) ×2 IMPLANT
TUBING CIL FLEX 10 FLL-RA (TUBING) ×2 IMPLANT

## 2019-12-10 NOTE — Telephone Encounter (Signed)
Returned call to patient, advised to keep echo as scheduled but would verify with NP and call back if not.  Patient verbalized understanding.  Cath today:  Widely patent coronary arteries including the LAD stent.  Myocardial ischemia with no obstructive coronary disease (INOCA).  Chronic diastolic heart failure, LVEDP greater than 22 mmHg.  EF 65%.

## 2019-12-10 NOTE — CV Procedure (Signed)
FINDINGS:  Widely patent coronary arteries including the LAD stent.  Myocardial ischemia with no obstructive coronary disease (INOCA).  Chronic diastolic heart failure, LVEDP greater than 22 mmHg.  EF 65%.  RECOMMENDATIONS:   Sublingual nitroglycerin for episodes of chest pain of prolonged.  Consider adding calcium channel blocker therapy as the first medication to treat micro circulatory dysfunction.  Therapy for chronic diastolic heart failure, consider SGLT2 if not already being used.

## 2019-12-10 NOTE — Discharge Instructions (Signed)
NO METFORMIN/ GLUCOPHAGE FOR 2 DAYS    Radial Site Care  This sheet gives you information about how to care for yourself after your procedure. Your health care provider may also give you more specific instructions. If you have problems or questions, contact your health care provider. What can I expect after the procedure? After the procedure, it is common to have: Bruising and tenderness at the catheter insertion area. Follow these instructions at home: Medicines Take over-the-counter and prescription medicines only as told by your health care provider. Insertion site care Follow instructions from your health care provider about how to take care of your insertion site. Make sure you: Wash your hands with soap and water before you change your bandage (dressing). If soap and water are not available, use hand sanitizer. Change your dressing as told by your health care provider. Leave stitches (sutures), skin glue, or adhesive strips in place. These skin closures may need to stay in place for 2 weeks or longer. If adhesive strip edges start to loosen and curl up, you may trim the loose edges. Do not remove adhesive strips completely unless your health care provider tells you to do that. Check your insertion site every day for signs of infection. Check for: Redness, swelling, or pain. Fluid or blood. Pus or a bad smell. Warmth. Do not take baths, swim, or use a hot tub until your health care provider approves. You may shower 24-48 hours after the procedure, or as directed by your health care provider. Remove the dressing and gently wash the site with plain soap and water. Pat the area dry with a clean towel. Do not rub the site. That could cause bleeding. Do not apply powder or lotion to the site. Activity  For 24 hours after the procedure, or as directed by your health care provider: Do not flex or bend the affected arm. Do not push or pull heavy objects with the affected arm. Do not  drive yourself home from the hospital or clinic. You may drive 24 hours after the procedure unless your health care provider tells you not to. Do not operate machinery or power tools. Do not lift anything that is heavier than 10 lb (4.5 kg), or the limit that you are told, until your health care provider says that it is safe. Ask your health care provider when it is okay to: Return to work or school. Resume usual physical activities or sports. Resume sexual activity. General instructions If the catheter site starts to bleed, raise your arm and put firm pressure on the site. If the bleeding does not stop, get help right away. This is a medical emergency. If you went home on the same day as your procedure, a responsible adult should be with you for the first 24 hours after you arrive home. Keep all follow-up visits as told by your health care provider. This is important. Contact a health care provider if: You have a fever. You have redness, swelling, or yellow drainage around your insertion site. Get help right away if: You have unusual pain at the radial site. The catheter insertion area swells very fast. The insertion area is bleeding, and the bleeding does not stop when you hold steady pressure on the area. Your arm or hand becomes pale, cool, tingly, or numb. These symptoms may represent a serious problem that is an emergency. Do not wait to see if the symptoms will go away. Get medical help right away. Call your local emergency services (911 in  the [place] Do not drive yourself to the hospital. Summary After the procedure, it is common to have bruising and tenderness at the site. Follow instructions from your health care provider about how to take care of your radial site wound. Check the wound every day for signs of infection. Do not lift anything that is heavier than 10 lb (4.5 kg), or the limit that you are told, until your health care provider says that it is safe. This information is  not intended to replace advice given to you by your health care provider. Make sure you discuss any questions you have with your health care provider. Document Revised: 04/16/2017 Document Reviewed: 04/16/2017 Elsevier Patient Education  2020 Elsevier Inc.     Use sublingual nitroglycerin if prolonged chest pain.  A new prescription is written.  Take nitroglycerin only while sitting and do not stand for least 10 minutes after the last dose.

## 2019-12-10 NOTE — Telephone Encounter (Signed)
Patient would like to ensure that she still needs to have echo, scheduled for 12/13/19. Please advise.

## 2019-12-10 NOTE — Interval H&P Note (Signed)
Cath Lab Visit (complete for each Cath Lab visit)  Clinical Evaluation Leading to the Procedure:   ACS: No.  Non-ACS:    Anginal Classification: CCS III  Anti-ischemic medical therapy: Minimal Therapy (1 class of medications)  Non-Invasive Test Results: Intermediate-risk stress test findings: cardiac mortality 1-3%/year  Prior CABG: No previous CABG      History and Physical Interval Note:  12/10/2019 7:27 AM  [name]  has presented today for surgery, with the diagnosis of unstable angina.  The various methods of treatment have been discussed with the patient and family. After consideration of risks, benefits and other options for treatment, the patient has consented to  Procedure(s): LEFT HEART CATH AND CORONARY ANGIOGRAPHY (N/A) as a surgical intervention.  The patient's history has been reviewed, patient examined, no change in status, stable for surgery.  I have reviewed the patient's chart and labs.  Questions were answered to the patient's satisfaction.     Lyn Records III

## 2019-12-11 ENCOUNTER — Other Ambulatory Visit: Payer: Self-pay | Admitting: Internal Medicine

## 2019-12-13 ENCOUNTER — Other Ambulatory Visit: Payer: Self-pay

## 2019-12-13 ENCOUNTER — Ambulatory Visit (HOSPITAL_COMMUNITY): Payer: PPO | Attending: Cardiology

## 2019-12-13 DIAGNOSIS — I2583 Coronary atherosclerosis due to lipid rich plaque: Secondary | ICD-10-CM | POA: Diagnosis not present

## 2019-12-13 DIAGNOSIS — I2510 Atherosclerotic heart disease of native coronary artery without angina pectoris: Secondary | ICD-10-CM | POA: Diagnosis not present

## 2019-12-13 LAB — ECHOCARDIOGRAM COMPLETE
Area-P 1/2: 2.55 cm2
S' Lateral: 2.6 cm

## 2019-12-13 MED FILL — NITROGLYCERIN 0.4 MG TAB SL: 0.4 | 8 days supply | Qty: 25 | Fill #0

## 2019-12-17 NOTE — Patient Instructions (Addendum)
Good to see you again today!  Assuming all is well we can plan to visit in 6 months I would recommend a covid booster shot for you  Great job with exercise Dr Rennis Golden may make a couple of medication changes for you at your upcoming appt   Flu shot given today Let me know if the left lower abdomen pain returns!

## 2019-12-17 NOTE — Progress Notes (Addendum)
[facility] [address] [place], [place] [address] [phone] [id number]  Date:  12/20/2019   Name:  [name]   DOB:  1950/07/13   MRN:  [id number]  PCP:  Pearline Cables, MD    Chief Complaint: Annual Exam (flu shot)   History of Present Illness:  [name] is a 69 y.o. very pleasant female patient who presents with the following:  Pt here today for a CPE- history of hypertension, CAD, diabetes, sleep apnea, obesity, osteopenia with elevated fracture risk on fosamax Last seen by myself in April of this year   She was also recently admitted for a left heart cath- results as below  Widely patent coronary arteries including the LAD stent.  Myocardial ischemia with no obstructive coronary disease (INOCA).  Chronic diastolic heart failure, LVEDP greater than 22 mmHg.  EF 65%. RECOMMENDATIONS:  Sublingual nitroglycerin for episodes of chest pain of prolonged.  Consider adding calcium channel blocker therapy as the first medication to treat micro circulatory dysfunction.  Therapy for chronic diastolic heart failure, consider SGLT2 if not already being used.  She is seeing Dr Rennis Golden for follow-up in about 10 days  Flu vaccine- done today  A1c due today immun OW UTD- recommend covid 19 booster, she plans to do this at pharmacy   She also notes that about 10 days ago she had some LLQ pain - this lasted for about 3 days and resolved.  She now feels fine.  No history known of kidney stones or diverticulitis  Patient Active Problem List   Diagnosis Date Noted  . Obstructive sleep apnea treated with continuous positive airway pressure (CPAP) 05/07/2018  . Chest pain 01/31/2018  . Morbid obesity (HCC) 11/03/2017  . Coronary artery disease involving native coronary artery of native heart with unstable angina pectoris (HCC) 11/03/2017  . Insomnia 11/03/2017  . Inadequate sleep hygiene 11/03/2017  . Anxiety 07/21/2017   . Dizziness 10/02/2016  . Right patella fracture 08/29/2016  . Acquired foot deformity, left 07/18/2016  . Osteopenia 11/10/2015  . HNP (herniated nucleus pulposus), lumbar 10/12/2014  . Spinal stenosis at L4-L5 level 10/12/2014  . Iron deficiency anemia 07/29/2014  . DM (diabetes mellitus) with complications (HCC) 07/29/2014  . Environmental and seasonal allergies 04/28/2014  . Spinal stenosis, lumbar region, with neurogenic claudication 01/06/2013  . Obesity, morbid, BMI 40.0-49.9 (HCC) 10/20/2012  . Chest pain with moderate risk of acute coronary syndrome 10/02/2012  . CAD S/P percutaneous coronary angioplasty   . Hyperlipidemia with target LDL less than 70   . Essential hypertension 04/16/2012    Past Medical History:  Diagnosis Date  . Anemia   . Arthritis   . Bronchitis    hx of   . CAD (coronary artery disease)    a. s/p DES to LAD in 2010 with patent stent by cath in 2014 b. low-risk NST in 11/2016  . Cataracts, bilateral   . Diabetes mellitus without complication (HCC)   . Family history of adverse reaction to anesthesia    pts mother had difficulty awakening   . GERD (gastroesophageal reflux disease)   . Hyperlipidemia LDL goal < 70   . Hypertension   . Lumbar back pain   . Numbness    left leg and foot   . Plantar fascia rupture    Left Foot  . Sleep apnea    uses CPAP     Past Surgical History:  Procedure Laterality Date  . ABDOMINAL HYSTERECTOMY    . BACK SURGERY    . BREAST CYST ASPIRATION  1995  . CAROTID STENT  2009   pt denies   . CORONARY ANGIOPLASTY WITH STENT PLACEMENT  2010and 10-02-2012   Stent DES, Xience to prox. LAD  . DOPPLER ECHOCARDIOGRAPHY  08/01/2009   EF=>55%,LV normal  . LEFT HEART CATH AND CORONARY ANGIOGRAPHY N/A 12/10/2019   Procedure: LEFT HEART CATH AND CORONARY ANGIOGRAPHY;  Surgeon: Lyn RecordsSmith, Henry W, MD;  Location: [facility] IN[facility];  Service: Cardiovascular;  Laterality: N/A;  . LEFT HEART CATHETERIZATION WITH CORONARY  ANGIOGRAM N/A 10/02/2012   Procedure: LEFT HEART CATHETERIZATION WITH CORONARY ANGIOGRAM;  Surgeon: Runell GessJonathan J Berry, MD;  [facility];  Service: Cardiovascular;  Laterality: N/A;  . lower arterial duplex  06/20/10   abi's normal,rgt 0.98,lft 1.06;bilateral PVRs normal  . LUMBAR LAMINECTOMY/DECOMPRESSION MICRODISCECTOMY Left 01/06/2013   Procedure: MICRO LUMBAR DECOMPRESSION L4-5 AND L5-S1;  Surgeon: Javier DockerJeffrey C Beane, MD;  Location: [facility];  Service: Orthopedics;  Laterality: Left;  . LUMBAR LAMINECTOMY/DECOMPRESSION MICRODISCECTOMY Left 10/12/2014   Procedure: REVISION MICRO LUMBAR/DECOMPRESSION L4-5 LEFT ;  Surgeon: Jene EveryJeffrey Beane, MD;  Location: [facility];  Service: Orthopedics;  Laterality: Left;  . NM MYOCAR PERF WALL MOTION  09/22/2008   lexiscan-EF 83%; glogal LV systolic fx is norm. ,evidence of mild ischemia basal anterior,midanterior and apical lateral region(s).   . ORIF PATELLA Right 08/29/2016   Procedure: OPEN REDUCTION INTERNAL (ORIF) FIXATION RIGHT PATELLA;  Surgeon: Samson FredericSwinteck, Brian, MD;  Location: [facility];  Service: Orthopedics;  Laterality: Right;  Adductor Block  . TUBAL LIGATION    . TYMPANOPLASTY Bilateral   . UVULOPALATOPHARYNGOPLASTY     pt denies     Social History   Tobacco Use  . Smoking status: Never Smoker  . Smokeless tobacco: Never Used  Vaping Use  . Vaping Use: Never used  Substance Use Topics  . Alcohol use: Yes    Comment: occasional, 1 a month wine  . Drug use: No    Family History  Problem Relation Age of Onset  . Coronary artery disease Mother   . Rheum arthritis Mother   . Dementia Mother   . Heart attack Father   . Hypertension Father   . Hyperlipidemia Father   . Other Father        MVA  . Hypertension Brother   . Cancer Brother   . Heart disease Brother   . Cancer Paternal Grandmother        stomach  . Diabetes Paternal Grandfather   . Breast cancer Neg Hx     Allergies  Allergen Reactions  . Niacin And Related Other (See  Comments)    Whelps and skin flushed, mouth tingling    Medication list has been reviewed and updated.  Current Outpatient Medications on File Prior to Visit  Medication Sig Dispense Refill  . acetaminophen (TYLENOL) 500 MG tablet Take 500 mg by mouth every 6 (six) hours as needed for mild pain or headache.    . alendronate (FOSAMAX) 70 MG tablet Take 1 tablet (70 mg total) by mouth every 7 (seven) days. Take with a full glass of water on an empty stomach. (Patient taking differently: Take 70 mg by mouth every Sunday. Take with a full glass of water on an empty stomach.) 12 tablet 3  . aspirin EC 81 MG EC tablet Take 1 tablet (81 mg total) by mouth daily. 30 tablet 1  .  calcium carbonate (OS-CAL) 1250 (500 Ca) MG chewable tablet Chew 1 tablet by mouth daily.    . cetirizine (ZYRTEC) 10 MG chewable tablet Chew 10 mg by mouth daily as needed for allergies.    . cholecalciferol (VITAMIN D) 1000 UNITS tablet Take 1,000 Units by mouth daily.    . clopidogrel (PLAVIX) 75 MG tablet TAKE 1 TABLET BY MOUTH DAILY. (Patient taking differently: Take 75 mg by mouth daily. ) 90 tablet 3  . cyclobenzaprine (FLEXERIL) 10 MG tablet TAKE 1 TABLET BY MOUTH TWICE DAILY AS NEEDED FOR MUSCLE SPASMS (Patient taking differently: Take 10 mg by mouth daily as needed for muscle spasms (Back Spasms). ) 30 tablet 2  . diazepam (VALIUM) 2 MG tablet TAKE 1 TABLET BY MOUTH EVERY 8 HOURS AS NEEDED FOR MUSCLE SPASMS (Patient taking differently: Take 2 mg by mouth every 8 (eight) hours as needed for muscle spasms. ) 30 tablet 0  . diclofenac sodium (VOLTAREN) 1 % GEL Apply 2 g topically 4 (four) times daily. (Patient taking differently: Apply 2 g topically 3 (three) times daily. )    . famotidine (PEPCID) 20 MG tablet TAKE 1 TABLET BY MOUTH 2 TIMES DAILY. (Patient taking differently: Take 20 mg by mouth daily as needed for heartburn. ) 180 tablet 1  . FLUoxetine (PROZAC) 20 MG capsule TAKE 1 CAPSULE BY MOUTH ONCE DAILY (Patient  taking differently: Take 20 mg by mouth daily. ) 90 capsule 3  . fluticasone (FLONASE) 50 MCG/ACT nasal spray Place 1 spray into both nostrils daily as needed for allergies or rhinitis.    Bess Harvest Ethyl (VASCEPA) 1 g CAPS Take 2 capsules (2 g total) by mouth 2 (two) times daily. (Patient taking differently: Take 1 g by mouth 2 (two) times daily. ) 120 capsule 11  . metFORMIN (GLUCOPHAGE-XR) 500 MG 24 hr tablet Take 1 tablet (500 mg total) by mouth daily with breakfast. 90 tablet 0  . methocarbamol (ROBAXIN) 500 MG tablet TAKE 1 TABLET (500 MG TOTAL) BY MOUTH EVERY 8 (EIGHT) HOURS AS NEEDED FOR MUSCLE SPASMS. 40 tablet 0  . metoprolol succinate (TOPROL-XL) 25 MG 24 hr tablet TAKE 1/2 OF A TABLET BY MOUTH DAILY. 45 tablet 3  . Multiple Vitamin (MULTIVITAMIN WITH MINERALS) TABS tablet Take 1 tablet by mouth daily.    . nitroGLYCERIN (NITROSTAT) 0.4 MG SL tablet Place 1 tablet (0.4 mg total) under the tongue every 5 (five) minutes as needed for chest pain. 25 tablet 2  . simvastatin (ZOCOR) 40 MG tablet Take 1 tablet (40 mg total) by mouth daily. (Patient taking differently: Take 40 mg by mouth at bedtime. ) 90 tablet 0  . telmisartan (MICARDIS) 40 MG tablet Take 1 tablet (40 mg total) by mouth every morning. NEED OV. (Patient taking differently: Take 40 mg by mouth daily. ) 30 tablet 1   Current Facility-Administered Medications on File Prior to Visit  Medication Dose Route Frequency Provider Last Rate Last Admin  . sodium chloride flush (NS) 0.9 % injection 3 mL  3 mL Intravenous Q12H Cleaver, Thomasene Ripple, NP        Review of Systems:  As per HPI- otherwise negative.   Physical Examination: Vitals:   12/20/19 0909  BP: 122/60  Pulse: 61  Resp: 16  SpO2: 96%   Vitals:   12/20/19 0909  Weight: 207 lb (93.9 kg)  Height: 5' 1.5" (1.562 m)   Body mass index is 38.48 kg/m. Ideal Body Weight: Weight in (lb) to have BMI =  25: 134.2  GEN: no acute distress.  Obese, looks well  HEENT:  Atraumatic, Normocephalic.  Ears and Nose: No external deformity. CV: RRR, No M/G/R. No JVD. No thrill. No extra heart sounds. PULM: CTA B, no wheezes, crackles, rhonchi. No retractions. No resp. distress. No accessory muscle use. ABD: S, NT, ND, +BS. No rebound. No HSM.  Belly is currently benign EXTR: No c/c/e PSYCH: Normally interactive. Conversant.  Healing bruises on both arms from recent IV and cardiac cath    Assessment and Plan: DM (diabetes mellitus) with complications (HCC) - Plan: Hemoglobin A1c, Hemoglobin A1c, CANCELED: Hemoglobin A1c  Osteopenia, unspecified location  Hyperlipidemia with target LDL less than 70 - Plan: Lipid panel  Essential hypertension - Plan: Hepatic function panel  CAD S/P percutaneous coronary angioplasty  Obstructive sleep apnea treated with continuous positive airway pressure (CPAP)  Needs flu shot - Plan: Flu Vaccine QUAD High Dose(Fluad)  Patient today for a follow-up visit. A1c ordered today Flu shot given Recommend COVID-19 booster Discussed her recent heart cath results, advised that Dr. Rennis Golden  may wish to start SGLT2 inhibitor and may change her blood pressure regimen Continue exercise Follow-up in 6 months assuming all is well This visit occurred during the SARS-CoV-2 public health emergency.  Safety protocols were in place, including screening questions prior to the visit, additional usage of staff PPE, and extensive cleaning of exam room while observing appropriate contact time as indicated for disinfecting solutions.    Signed Abbe Amsterdam, MD  addnd 9/28- message to pt  Results for orders placed or performed in visit on 12/20/19  Lipid panel  Result Value Ref Range   Cholesterol 209 (H) <200 mg/dL   HDL 33 (L) > OR = 50 mg/dL   Triglycerides 956 (H) <150 mg/dL   LDL Cholesterol (Calc)  mg/dL (calc)   Total CHOL/HDL Ratio 6.3 (H) <5.0 (calc)   Non-HDL Cholesterol (Calc) 176 (H) <130 mg/dL (calc)  Hemoglobin L8V   Result Value Ref Range   Hgb A1c MFr Bld 7.0 (H) <5.7 % of total Hgb   Mean Plasma Glucose 154 (calc)   eAG (mmol/L) 8.5 (calc)  Hepatic function panel  Result Value Ref Range   Total Protein 6.7 6.1 - 8.1 g/dL   Albumin 4.4 3.6 - 5.1 g/dL   Globulin 2.3 1.9 - 3.7 g/dL (calc)   AG Ratio 1.9 1.0 - 2.5 (calc)   Total Bilirubin 0.5 0.2 - 1.2 mg/dL   Bilirubin, Direct 0.1 0.0 - 0.2 mg/dL   Indirect Bilirubin 0.4 0.2 - 1.2 mg/dL (calc)   Alkaline phosphatase (APISO) 91 37 - 153 U/L   AST 16 10 - 35 U/L   ALT 16 6 - 29 U/L

## 2019-12-18 DIAGNOSIS — G4733 Obstructive sleep apnea (adult) (pediatric): Secondary | ICD-10-CM | POA: Diagnosis not present

## 2019-12-20 ENCOUNTER — Ambulatory Visit (INDEPENDENT_AMBULATORY_CARE_PROVIDER_SITE_OTHER): Payer: PPO | Admitting: Family Medicine

## 2019-12-20 ENCOUNTER — Other Ambulatory Visit: Payer: Self-pay

## 2019-12-20 ENCOUNTER — Encounter: Payer: Self-pay | Admitting: Family Medicine

## 2019-12-20 VITALS — BP 122/60 | HR 61 | Resp 16 | Ht 61.5 in | Wt 207.0 lb

## 2019-12-20 DIAGNOSIS — Z9989 Dependence on other enabling machines and devices: Secondary | ICD-10-CM

## 2019-12-20 DIAGNOSIS — I10 Essential (primary) hypertension: Secondary | ICD-10-CM

## 2019-12-20 DIAGNOSIS — M8580 Other specified disorders of bone density and structure, unspecified site: Secondary | ICD-10-CM

## 2019-12-20 DIAGNOSIS — E118 Type 2 diabetes mellitus with unspecified complications: Secondary | ICD-10-CM | POA: Diagnosis not present

## 2019-12-20 DIAGNOSIS — G4733 Obstructive sleep apnea (adult) (pediatric): Secondary | ICD-10-CM | POA: Diagnosis not present

## 2019-12-20 DIAGNOSIS — E785 Hyperlipidemia, unspecified: Secondary | ICD-10-CM | POA: Diagnosis not present

## 2019-12-20 DIAGNOSIS — Z9861 Coronary angioplasty status: Secondary | ICD-10-CM | POA: Diagnosis not present

## 2019-12-20 DIAGNOSIS — Z23 Encounter for immunization: Secondary | ICD-10-CM | POA: Diagnosis not present

## 2019-12-20 DIAGNOSIS — I2510 Atherosclerotic heart disease of native coronary artery without angina pectoris: Secondary | ICD-10-CM

## 2019-12-21 ENCOUNTER — Encounter: Payer: Self-pay | Admitting: Family Medicine

## 2019-12-21 LAB — HEPATIC FUNCTION PANEL
AG Ratio: 1.9 (calc) (ref 1.0–2.5)
ALT: 16 U/L (ref 6–29)
AST: 16 U/L (ref 10–35)
Albumin: 4.4 g/dL (ref 3.6–5.1)
Alkaline phosphatase (APISO): 91 U/L (ref 37–153)
Bilirubin, Direct: 0.1 mg/dL (ref 0.0–0.2)
Globulin: 2.3 g/dL (calc) (ref 1.9–3.7)
Indirect Bilirubin: 0.4 mg/dL (calc) (ref 0.2–1.2)
Total Bilirubin: 0.5 mg/dL (ref 0.2–1.2)
Total Protein: 6.7 g/dL (ref 6.1–8.1)

## 2019-12-21 LAB — HEMOGLOBIN A1C
Hgb A1c MFr Bld: 7 % of total Hgb — ABNORMAL HIGH (ref <5.7)
Mean Plasma Glucose: 154 (calc)
eAG (mmol/L): 8.5 (calc)

## 2019-12-21 LAB — LIPID PANEL
Cholesterol: 209 mg/dL — ABNORMAL HIGH (ref <200)
HDL: 33 mg/dL — ABNORMAL LOW (ref >=50)
Non-HDL Cholesterol (Calc): 176 mg/dL (calc) — ABNORMAL HIGH (ref <130)
Total CHOL/HDL Ratio: 6.3 (calc) — ABNORMAL HIGH (ref <5.0)
Triglycerides: 511 mg/dL — ABNORMAL HIGH (ref <150)

## 2019-12-27 ENCOUNTER — Other Ambulatory Visit: Payer: Self-pay | Admitting: Family Medicine

## 2019-12-27 DIAGNOSIS — E119 Type 2 diabetes mellitus without complications: Secondary | ICD-10-CM

## 2019-12-27 MED FILL — metFORMIN HCL ER 500 MG TB2: 500 | 90 days supply | Qty: 90 | Fill #0

## 2019-12-28 NOTE — Progress Notes (Signed)
Cardiology Clinic Note   Patient Name: [name] Date of Encounter: 12/29/2019  Primary Care Provider:  Pearline Cables, MD Primary Cardiologist:  Chrystie Nose, MD  Patient Profile    [name]. [place] 69 year old female presents the clinic today to review her cardiac catheterization results.  Past Medical History    Past Medical History:  Diagnosis Date  . Anemia   . Arthritis   . Bronchitis    hx of   . CAD (coronary artery disease)    a. s/p DES to LAD in 2010 with patent stent by cath in 2014 b. low-risk NST in 11/2016  . Cataracts, bilateral   . Diabetes mellitus without complication (HCC)   . Family history of adverse reaction to anesthesia    pts mother had difficulty awakening   . GERD (gastroesophageal reflux disease)   . Hyperlipidemia LDL goal < 70   . Hypertension   . Lumbar back pain   . Numbness    left leg and foot   . Plantar fascia rupture    Left Foot  . Sleep apnea    uses CPAP    Past Surgical History:  Procedure Laterality Date  . ABDOMINAL HYSTERECTOMY    . BACK SURGERY    . BREAST CYST ASPIRATION  1995  . CAROTID STENT  2009   pt denies   . CORONARY ANGIOPLASTY WITH STENT PLACEMENT  2010and 10-02-2012   Stent DES, Xience to prox. LAD  . DOPPLER ECHOCARDIOGRAPHY  08/01/2009   EF=>55%,LV normal  . LEFT HEART CATH AND CORONARY ANGIOGRAPHY N/A 12/10/2019   Procedure: LEFT HEART CATH AND CORONARY ANGIOGRAPHY;  Surgeon: Lyn Records, MD;  Location: [facility];  Service: Cardiovascular;  Laterality: N/A;  . LEFT HEART CATHETERIZATION WITH CORONARY ANGIOGRAM N/A 10/02/2012   Procedure: LEFT HEART CATHETERIZATION WITH CORONARY ANGIOGRAM;  Surgeon: Runell Gess, MD;  Location: [facility] CATH LAB;  Service: Cardiovascular;  Laterality: N/A;  . lower arterial duplex  06/20/10   abi's normal,rgt 0.98,lft 1.06;bilateral PVRs normal  . LUMBAR LAMINECTOMY/DECOMPRESSION MICRODISCECTOMY Left 01/06/2013   Procedure: MICRO LUMBAR DECOMPRESSION L4-5  AND L5-S1;  Surgeon: Javier Docker, MD;  Location: [facility] OR[facility];  Service: Orthopedics;  Laterality: Left;  . LUMBAR LAMINECTOMY/DECOMPRESSION MICRODISCECTOMY Left 10/12/2014   Procedure: REVISION MICRO LUMBAR/DECOMPRESSION L4-5 LEFT ;  Surgeon: Jene Every, MD;  Location: [facility];  Service: Orthopedics;  Laterality: Left;  . NM MYOCAR PERF WALL MOTION  09/22/2008   lexiscan-EF 83%; glogal LV systolic fx is norm. ,evidence of mild ischemia basal anterior,midanterior and apical lateral region(s).   . ORIF PATELLA Right 08/29/2016   Procedure: OPEN REDUCTION INTERNAL (ORIF) FIXATION RIGHT PATELLA;  Surgeon: Samson Frederic, MD;  Location: [facility];  Service: Orthopedics;  Laterality: Right;  Adductor Block  . TUBAL LIGATION    . TYMPANOPLASTY Bilateral   . UVULOPALATOPHARYNGOPLASTY     pt denies     Allergies  Allergies  Allergen Reactions  . Niacin And Related Other (See Comments)    Whelps and skin flushed, mouth tingling    History of Present Illness    Ms. Manner has a PMH of coronary artery disease, hypertension, hyperlipidemia, diabetes mellitus type 2, obstructive sleep apnea on CPAP and obesity.  She underwent cardiac catheterization with PCI and DES to proximal LAD 2010.  Last cardiac catheterization 10/02/2012 showed widely patent proximal LAD stent, normal dominant RCA, normal left LCx, LVEF 60% without wall motion abnormality.  A nuclear stress test  9/18 showed an EF of 78%, fixed small mild apical lateral perfusion defect, and no ischemia.  It was a low risk study.  She was seen by Dr. Rennis Golden via virtual visit 07/23/2018, during that time she was doing well.  She had a follow-up virtual visit with pounding PA-C on 11/17/2019.  During that time she denied exertional chest pain and increased work of breathing.  She noted chest discomfort during emotional stress/distress.  She denied chest discomfort during physical activity.  Her blood pressure was slightly elevated at 1 40-50 during her  virtual follow-up visit.  She was instructed to monitor her blood pressures and follow-up.  She called back on 11/17/2018 1 in the afternoon and indicated that her blood pressure was running 125-130 with heart rates in the 60s-70s.  She was out shopping with her husband on 11/18/2019 and developed substernal chest pain.  She took nitroglycerin and aspirin.  She was transported via EMS to the emergency department.  She stayed overnight and was not seen.  She chose to leave the emergency department without being seen.  EKG showed normal sinus rhythm with right bundle branch block 63 bpm.  Troponins were low and flat.  CBC shows slightly elevated WBCs, BMP unremarkable.  Chest x-ray showed no acute findings.  She presented to the clinic 11/24/2019 for follow-up evaluation and stated she was shopping and developed substernal chest pain.  It was not relieved by nitroglycerin and radiated to her left pectoral region.  She underwent testing in the emergency department and was not seen in 20 hours.  She presented to the clinic and stated that she  had occasional intermittent periods of sharp chest pain that are relieved with rest.  She had no exertional chest pain.  She stated that prior to her incident while she was shopping she had no other episodes of chest discomfort.  She remained somewhat active shopping 3-4 times a week doing significant amounts of walking.  She did indicate that she had some increased stress hearing that 3 days prior to her chest discomfort 3 individuals in her area had died from Covid.  I will ordered an echocardiogram, and nuclear stress test. Her echocardiogram 12/13/2019 showed an EF of 60-65% and intermediate diastolic parameters.  Her nuclear stress test showed a large size, moderate intensity reversible mid to distal anterior, apical and mid to apical inferior perfusion defect.  Her study was considered intermediate risk.  She was referred for cardiac catheterization which showed patent  coronary arteries including her LAD stent and an EF of 65%.  She presents to the clinic today for follow-up evaluation and states she has been trying to increase her physical activity but does notice some increased shortness of breath.  She states that she is not ever really been very physically active due to her ankle mobility.  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  However, since that time she has not been quite as active.  She avoids extra sodium in her diet.  We reviewed her most recent lipid panel.  She states that she was not fasting for these labs.  She also indicated that she takes only 1 g of Vascepa in the morning and 1 g at night due to flatulence side effects.  We have discussed increasing fiber in her diet.  I will stop her simvastatin and start her on rosuvastatin.  We will order a repeat lipid and liver panel in  8 weeks and have her follow-up as scheduled with Dr. Rennis GoldenHilty.  Today she denies chest pain, shortness of breath, lower extremity edema, fatigue, palpitations, melena, hematuria, hemoptysis, diaphoresis, weakness, presyncope, syncope, orthopnea, and PND.  Home Medications    Prior to Admission medications   Medication Sig Start Date End Date Taking? Authorizing Provider  acetaminophen (TYLENOL) 500 MG tablet Take 500 mg by mouth every 6 (six) hours as needed for mild pain or headache.    [provider]  alendronate (FOSAMAX) 70 MG tablet Take 1 tablet (70 mg total) by mouth every 7 (seven) days. Take with a full glass of water on an empty stomach. Patient taking differently: Take 70 mg by mouth every Sunday. Take with a full glass of water on an empty stomach. 05/28/19   Copland, Gwenlyn FoundJessica C, MD  aspirin EC 81 MG EC tablet Take 1 tablet (81 mg total) by mouth daily. 08/30/16   Swinteck, Arlys JohnBrian, MD  calcium carbonate (OS-CAL) 1250 (500 Ca) MG chewable tablet Chew 1 tablet by mouth daily.    [provider]   cetirizine (ZYRTEC) 10 MG chewable tablet Chew 10 mg by mouth daily as needed for allergies.    [provider]  cholecalciferol (VITAMIN D) 1000 UNITS tablet Take 1,000 Units by mouth daily.    [provider]  clopidogrel (PLAVIX) 75 MG tablet TAKE 1 TABLET BY MOUTH DAILY. Patient taking differently: Take 75 mg by mouth daily.  03/22/19   Hilty, Lisette AbuKenneth C, MD  cyclobenzaprine (FLEXERIL) 10 MG tablet TAKE 1 TABLET BY MOUTH TWICE DAILY AS NEEDED FOR MUSCLE SPASMS Patient taking differently: Take 10 mg by mouth daily as needed for muscle spasms (Back Spasms).  05/08/18   Copland, Gwenlyn FoundJessica C, MD  diazepam (VALIUM) 2 MG tablet TAKE 1 TABLET BY MOUTH EVERY 8 HOURS AS NEEDED FOR MUSCLE SPASMS Patient taking differently: Take 2 mg by mouth every 8 (eight) hours as needed for muscle spasms.  11/30/19   Copland, Gwenlyn FoundJessica C, MD  diclofenac sodium (VOLTAREN) 1 % GEL Apply 2 g topically 4 (four) times daily. Patient taking differently: Apply 2 g topically 3 (three) times daily.  02/01/18   Joseph ArtVann, Jessica U, DO  famotidine (PEPCID) 20 MG tablet TAKE 1 TABLET BY MOUTH 2 TIMES DAILY. Patient taking differently: Take 20 mg by mouth daily as needed for heartburn.  07/25/18   Hilty, Lisette AbuKenneth C, MD  FLUoxetine (PROZAC) 20 MG capsule TAKE 1 CAPSULE BY MOUTH ONCE DAILY Patient taking differently: Take 20 mg by mouth daily.  11/22/19   Copland, Gwenlyn FoundJessica C, MD  fluticasone (FLONASE) 50 MCG/ACT nasal spray Place 1 spray into both nostrils daily as needed for allergies or rhinitis.    [provider]  Icosapent Ethyl (VASCEPA) 1 g CAPS Take 2 capsules (2 g total) by mouth 2 (two) times daily. Patient taking differently: Take 1 g by mouth 2 (two) times daily.  11/18/18   Copland, Gwenlyn FoundJessica C, MD  metFORMIN (GLUCOPHAGE-XR) 500 MG 24 hr tablet TAKE 1 TABLET BY MOUTH ONCE A DAY WITH BREAKFAST 12/27/19   Copland, Gwenlyn FoundJessica C, MD  methocarbamol (ROBAXIN) 500 MG tablet TAKE 1 TABLET (500 MG TOTAL) BY MOUTH EVERY 8  (EIGHT) HOURS AS NEEDED FOR MUSCLE SPASMS. 08/20/19   Copland, Gwenlyn FoundJessica C, MD  metoprolol succinate (TOPROL-XL) 25 MG 24 hr tablet TAKE 1/2 OF A TABLET BY MOUTH DAILY. 12/08/19   Copland, Gwenlyn FoundJessica C, MD  Multiple Vitamin (MULTIVITAMIN WITH MINERALS) TABS tablet Take 1 tablet by mouth  daily.    [provider]  nitroGLYCERIN (NITROSTAT) 0.4 MG SL tablet Place 1 tablet (0.4 mg total) under the tongue every 5 (five) minutes as needed for chest pain. 12/13/19   Hilty, Lisette Abu, MD  simvastatin (ZOCOR) 40 MG tablet Take 1 tablet (40 mg total) by mouth daily. Patient taking differently: Take 40 mg by mouth at bedtime.  09/28/19   Copland, [name], MD  telmisartan (MICARDIS) 40 MG tablet Take 1 tablet (40 mg total) by mouth every morning. NEED OV. Patient taking differently: Take 40 mg by mouth daily.  11/03/19   Hilty, Lisette Abu, MD    Family History    Family History  Problem Relation Age of Onset  . Coronary artery disease Mother   . Rheum arthritis Mother   . Dementia Mother   . Heart attack Father   . Hypertension Father   . Hyperlipidemia Father   . Other Father        MVA  . Hypertension Brother   . Cancer Brother   . Heart disease Brother   . Cancer Paternal Grandmother        stomach  . Diabetes Paternal Grandfather   . Breast cancer Neg Hx    She indicated that her mother is deceased. She indicated that her father is deceased. She indicated that both of her sisters are alive. She indicated that two of her three brothers are alive. She indicated that her maternal grandmother is deceased. She indicated that her maternal grandfather is deceased. She indicated that her paternal grandmother is deceased. She indicated that her paternal grandfather is deceased. She indicated that her daughter is alive. She indicated that her son is alive. She indicated that the status of her neg hx is unknown.  Social History    Social History   Socioeconomic History  . Marital status: Married     Spouse name: [name]  . Number of children: 2  . Years of education: MSN  . Highest education level: Not on file  Occupational History  . Occupation: DIRECTOR    Employer: [employer]  Tobacco Use  . Smoking status: Never Smoker  . Smokeless tobacco: Never Used  Vaping Use  . Vaping Use: Never used  Substance and Sexual Activity  . Alcohol use: Yes    Comment: occasional, 1 a month wine  . Drug use: No  . Sexual activity: Yes  Other Topics Concern  . Not on file  Social History Narrative      Married to [place]. Education: [employer]. Exercise: Yes   Patient lives at home with her spouse. retired   Caffeine use: 1 drink of tea a day   Social Determinants of Corporate investment banker Strain:   . Difficulty of Paying Living Expenses: Not on file  Food Insecurity:   . Worried About Programme researcher, broadcasting/film/video in the Last Year: Not on file  . Ran Out of Food in the Last Year: Not on file  Transportation Needs:   . Lack of Transportation (Medical): Not on file  . Lack of Transportation (Non-Medical): Not on file  Physical Activity:   . Days of Exercise per Week: Not on file  . Minutes of Exercise per Session: Not on file  Stress:   . Feeling of Stress : Not on file  Social Connections:   . Frequency of Communication with Friends and Family: Not on file  . Frequency of Social Gatherings with Friends and Family: Not on file  .  Attends Religious Services: Not on file  . Active Member of Clubs or Organizations: Not on file  . Attends Banker Meetings: Not on file  . Marital Status: Not on file  Intimate Partner Violence:   . Fear of Current or Ex-Partner: Not on file  . Emotionally Abused: Not on file  . Physically Abused: Not on file  . Sexually Abused: Not on file     Review of Systems    General:  No chills, fever, night sweats or weight changes.  Cardiovascular:  No chest pain, dyspnea on exertion, edema, orthopnea, palpitations, paroxysmal nocturnal  dyspnea. Dermatological: No rash, lesions/masses Respiratory: No cough, dyspnea Urologic: No hematuria, dysuria Abdominal:   No nausea, vomiting, diarrhea, bright red blood per rectum, melena, or hematemesis Neurologic:  No visual changes, wkns, changes in mental status. All other systems reviewed and are otherwise negative except as noted above.  Physical Exam    VS:  BP (!) 148/60   Pulse 61   Ht 5\' 1"  (1.549 m)   Wt 206 lb (93.4 kg)   LMP  (LMP Unknown)   SpO2 98%   BMI 38.92 kg/m  , BMI Body mass index is 38.92 kg/m. GEN: Well nourished, well developed, in no acute distress. HEENT: normal. Neck: Supple, no JVD, carotid bruits, or masses. Cardiac: RRR, no murmurs, rubs, or gallops. No clubbing, cyanosis, edema.  Radials/DP/PT 2+ and equal bilaterally.  Respiratory:  Respirations regular and unlabored, clear to auscultation bilaterally. GI: Soft, nontender, nondistended, BS + x 4. MS: no deformity or atrophy. Skin: warm and dry, no rash. Neuro:  Strength and sensation are intact. Psych: Normal affect.  Accessory Clinical Findings    Recent Labs: 07/01/2019: TSH 2.35 11/18/2019: BUN 17; Creatinine, Ser 0.81; Hemoglobin 12.1; Platelets 337; Potassium 4.1; Sodium 139 12/20/2019: ALT 16   Recent Lipid Panel    Component Value Date/Time   CHOL 209 (H) 12/20/2019 0945   TRIG 511 (H) 12/20/2019 0945   HDL 33 (L) 12/20/2019 0945   CHOLHDL 6.3 (H) 12/20/2019 0945   VLDL 30.8 07/01/2019 0921   LDLCALC  12/20/2019 0945     Comment:     . LDL cholesterol not calculated. Triglyceride levels greater than 400 mg/dL invalidate calculated LDL results. . Reference range: <100 . Desirable range <100 mg/dL for primary prevention;   <70 mg/dL for patients with CHD or diabetic patients  with > or = 2 CHD risk factors. 12/22/2019 LDL-C is now calculated using the Martin-Hopkins  calculation, which is a validated novel method providing  better accuracy than the Friedewald equation in the   estimation of LDL-C.  Marland Kitchen et al. Horald Pollen. Lenox Ahr): 2061-2068  (http://education.[facility].com/faq/FAQ164)    LDLDIRECT 82.0 11/16/2018 1010    ECG personally reviewed by me today-none today- No acute changes  Echocardiogram 12/13/2019 IMPRESSIONS    1. Left ventricular ejection fraction, by estimation, is 60 to 65%. The  left ventricle has normal function. The left ventricle has no regional  wall motion abnormalities. There is moderate asymmetric left ventricular  hypertrophy of the basal-septal  segment. Left ventricular diastolic parameters are indeterminate.  2. Right ventricular systolic function is normal. The right ventricular  size is normal. There is normal pulmonary artery systolic pressure.  3. The mitral valve is normal in structure. Trivial mitral valve  regurgitation. No evidence of mitral stenosis.  4. The aortic valve is grossly normal. There is mild calcification of the  aortic valve. There is mild thickening of the aortic  valve. Aortic valve  regurgitation is not visualized. No aortic stenosis is present.  5. The inferior vena cava is normal in size with greater than 50%  respiratory variability, suggesting right atrial pressure of 3 mmHg.   Cardiac catheterization 12/10/2019   Widely patent coronary arteries including the LAD stent.  Myocardial ischemia with no obstructive coronary disease (INOCA).  Chronic diastolic heart failure, LVEDP greater than 22 mmHg.  EF 65%.  RECOMMENDATIONS:   Sublingual nitroglycerin for episodes of chest pain of prolonged.  Consider adding calcium channel blocker therapy as the first medication to treat micro circulatory dysfunction.  Therapy for chronic diastolic heart failure, consider SGLT2 if not already being used.   Diagnostic Dominance: Right  Intervention    Assessment & Plan   1.  Coronary artery disease-no chest pain today.   She had an abnormal nuclear stress test which prompted a  cardiac catheterization.  LHC was completed on 12/10/2019 which showed nonobstructive coronary artery disease and a patent LAD stent.  EF 65% Continue Plavix, aspirin, metoprolol, nitroglycerin Heart healthy low-sodium diet-salty 6 given Increase physical activity as tolerated Test results reviewed and patient reassured that her coronary arteries were nonobstructive and that her pumping function was normal.  Essential hypertension-BP today  148/60.  Well-controlled at home 120s-130s over 60s Continue metoprolol, Micardis Heart healthy low-sodium diet-salty 6 given Increase physical activity as tolerated  Hyperlipidemia-07/01/2019: LDL Cholesterol 53; VLDL 30.8 12/20/2019: Cholesterol 209; HDL 33; Triglycerides 511 she states that this cholesterol panel was nonfasting. Continue , Vascepa-taking 1 g in the morning and 1 g at night due to GI upset/flatulence. I will stop her simvastatin Start rosuvastatin 20 mg Repeat lipid and liver in 8 weeks Heart healthy low-sodium high-fiber diet.   Increase physical activity as tolerated  Diabetes mellitus type 2-A1c 6.8 on 07/01/2019 Continue Metformin Heart healthy low-sodium diet-salty 6 given Increase physical activity as tolerated  Obstructive sleep apnea-continues compliance  with CPAP therapy. Continue CPAP  Disposition: Follow-up with Dr. Rennis Golden or myself in 3-4 months.   Thomasene Ripple. Aeon Kessner NP-C    12/29/2019, 2:51 PM [facility] [address] Office [phone] Fax [phone]  Notice: This dictation was prepared with Dragon dictation along with smaller phrase technology. Any transcriptional errors that result from this process are unintentional and may not be corrected upon review.

## 2019-12-29 ENCOUNTER — Other Ambulatory Visit: Payer: Self-pay | Admitting: General Practice

## 2019-12-29 ENCOUNTER — Encounter: Payer: Self-pay | Admitting: General Practice

## 2019-12-29 ENCOUNTER — Ambulatory Visit: Payer: PPO | Admitting: General Practice

## 2019-12-29 ENCOUNTER — Other Ambulatory Visit: Payer: Self-pay

## 2019-12-29 VITALS — BP 148/60 | HR 61 | Ht 61.0 in | Wt 206.0 lb

## 2019-12-29 DIAGNOSIS — Z79899 Other long term (current) drug therapy: Secondary | ICD-10-CM

## 2019-12-29 DIAGNOSIS — E118 Type 2 diabetes mellitus with unspecified complications: Secondary | ICD-10-CM

## 2019-12-29 DIAGNOSIS — E785 Hyperlipidemia, unspecified: Secondary | ICD-10-CM

## 2019-12-29 DIAGNOSIS — I2510 Atherosclerotic heart disease of native coronary artery without angina pectoris: Secondary | ICD-10-CM | POA: Diagnosis not present

## 2019-12-29 DIAGNOSIS — G4733 Obstructive sleep apnea (adult) (pediatric): Secondary | ICD-10-CM | POA: Diagnosis not present

## 2019-12-29 DIAGNOSIS — Z9989 Dependence on other enabling machines and devices: Secondary | ICD-10-CM | POA: Diagnosis not present

## 2019-12-29 DIAGNOSIS — I10 Essential (primary) hypertension: Secondary | ICD-10-CM | POA: Diagnosis not present

## 2019-12-29 DIAGNOSIS — I2583 Coronary atherosclerosis due to lipid rich plaque: Secondary | ICD-10-CM | POA: Diagnosis not present

## 2019-12-29 MED ORDER — ROSUVASTATIN CALCIUM 20 MG PO TABS: 20.0000 mg | ORAL_TABLET | Freq: Every day | ORAL | 3 refills | Status: DC

## 2019-12-29 MED FILL — ROSUVASTATIN CALCIUM 20 MG: 20 | 30 days supply | Qty: 30 | Fill #0

## 2019-12-29 NOTE — Patient Instructions (Addendum)
Medication Instructions:  STOP SIMVASTATIN  START ROSUVASTATIN 20MG   *If you need a refill on your cardiac medications before your next appointment, please call your pharmacy*  Lab Work:    Testing/Procedures:  FASTING LIPID&LFT-8 WEEKS NONE  Special Instructions PLEASE INCREASE FIBER IN YOUR DIET-SEE ATTACHED  PLEASE INCREASE PHYSICAL ACTIVITY AS TOLERATED  Follow-Up: Your next appointment:  KEEP SCHEDULED APPOINTMENT In Person with K. Hilty, MD At [facility], you and your health needs are our priority.  As part of our continuing mission to provide you with exceptional heart care, we have created designated Provider Care Teams.  These Care Teams include your primary Cardiologist (physician) and Advanced Practice Providers (APPs -  Physician Assistants and Nurse Practitioners) who all work together to provide you with the care you need, when you need it.    High-Fiber Diet Fiber, also called dietary fiber, is a type of carbohydrate that is found in fruits, vegetables, whole grains, and beans. A high-fiber diet can have many health benefits. Your health care provider may recommend a high-fiber diet to help:  Prevent constipation. Fiber can make your bowel movements more regular.  Lower your cholesterol.  Relieve the following conditions: ? Swelling of veins in the anus (hemorrhoids). ? Swelling and irritation (inflammation) of specific areas of the digestive tract (uncomplicated diverticulosis). ? A problem of the large intestine (colon) that sometimes causes pain and diarrhea (irritable bowel syndrome, IBS).  Prevent overeating as part of a weight-loss plan.  Prevent heart disease, type 2 diabetes, and certain cancers. What is my plan? The recommended daily fiber intake in grams (g) includes:  38 g for men age 96 or younger.  30 g for men over age 53.  25 g for women age 21 or younger.  21 g for women over age 70. You can get the recommended daily intake of  dietary fiber by:  Eating a variety of fruits, vegetables, grains, and beans.  Taking a fiber supplement, if it is not possible to get enough fiber through your diet. What do I need to know about a high-fiber diet?  It is better to get fiber through food sources rather than from fiber supplements. There is not a lot of research about how effective supplements are.  Always check the fiber content on the nutrition facts label of any prepackaged food. Look for foods that contain 5 g of fiber or more per serving.  Talk with a diet and nutrition specialist (dietitian) if you have questions about specific foods that are recommended or not recommended for your medical condition, especially if those foods are not listed below.  Gradually increase how much fiber you consume. If you increase your intake of dietary fiber too quickly, you may have bloating, cramping, or gas.  Drink plenty of water. Water helps you to digest fiber. What are tips for following this plan?  Eat a wide variety of high-fiber foods.  Make sure that half of the grains that you eat each day are whole grains.  Eat breads and cereals that are made with whole-grain flour instead of refined flour or white flour.  Eat brown rice, bulgur wheat, or millet instead of white rice.  Start the day with a breakfast that is high in fiber, such as a cereal that contains 5 g of fiber or more per serving.  Use beans in place of meat in soups, salads, and pasta dishes.  Eat high-fiber snacks, such as berries, raw vegetables, nuts, and popcorn.  Choose whole  fruits and vegetables instead of processed forms like juice or sauce. What foods can I eat?  Fruits Berries. Pears. Apples. Oranges. Avocado. Prunes and raisins. Dried figs. Vegetables Sweet potatoes. Spinach. Kale. Artichokes. Cabbage. Broccoli. Cauliflower. Green peas. Carrots. Squash. Grains Whole-grain breads. Multigrain cereal. Oats and oatmeal. Brown rice. Barley. Bulgur  wheat. Millet. Quinoa. Bran muffins. Popcorn. Rye wafer crackers. Meats and other proteins Navy, kidney, and pinto beans. Soybeans. Split peas. Lentils. Nuts and seeds. Dairy Fiber-fortified yogurt. Beverages Fiber-fortified soy milk. Fiber-fortified orange juice. Other foods Fiber bars. The items listed above may not be a complete list of recommended foods and beverages. Contact a dietitian for more options. What foods are not recommended? Fruits Fruit juice. Cooked, strained fruit. Vegetables Fried potatoes. Canned vegetables. Well-cooked vegetables. Grains White bread. Pasta made with refined flour. White rice. Meats and other proteins Fatty cuts of meat. Fried chicken or fried fish. Dairy Milk. Yogurt. Cream cheese. Sour cream. Fats and oils Butters. Beverages Soft drinks. Other foods Cakes and pastries. The items listed above may not be a complete list of foods and beverages to avoid. Contact a dietitian for more information. Summary  Fiber is a type of carbohydrate. It is found in fruits, vegetables, whole grains, and beans.  There are many health benefits of eating a high-fiber diet, such as preventing constipation, lowering blood cholesterol, helping with weight loss, and reducing your risk of heart disease, diabetes, and certain cancers.  Gradually increase your intake of fiber. Increasing too fast can result in cramping, bloating, and gas. Drink plenty of water while you increase your fiber.  The best sources of fiber include whole fruits and vegetables, whole grains, nuts, seeds, and beans. This information is not intended to replace advice given to you by your health care provider. Make sure you discuss any questions you have with your health care provider. Document Revised: 01/13/2017 Document Reviewed: 01/13/2017 Elsevier Patient Education  2020 [employer].

## 2019-12-30 ENCOUNTER — Ambulatory Visit: Payer: PPO | Admitting: Pharmacist

## 2019-12-30 DIAGNOSIS — E785 Hyperlipidemia, unspecified: Secondary | ICD-10-CM

## 2019-12-30 DIAGNOSIS — E118 Type 2 diabetes mellitus with unspecified complications: Secondary | ICD-10-CM

## 2019-12-30 DIAGNOSIS — I10 Essential (primary) hypertension: Secondary | ICD-10-CM

## 2019-12-30 NOTE — Chronic Care Management (AMB) (Signed)
Chronic Care Management Pharmacy  Name: [name]  MRN: [id number] DOB: 01-11-1951  Chief Complaint/ HPI  [name],  69 y.o. , female presents for their Follow-Up CCM visit with the clinical pharmacist via telephone due to COVID-19 Pandemic.  PCP : Pearline Cables, MD  Their chronic conditions include: DM, HTN, HLD, CAD, Depression, GERD, Osteopenia, Obesity, Allergic Rhinitis, Pain  Office Visits: 12/20/19: Visit w/ Dr. Patsy Lager - Discussed heart cath results. Flu shot given. Recommended COVID 19 booster. No med changes note.  Consult Visit: 12/29/19: Cardio visit w/ Edd Fabian, NP - CAD. Abnormal stress test prompted cardiac cath. " LHC was completed on 12/10/2019 which showed nonobstructive coronary artery disease and a patent LAD stent.  EF 65%. ContinuePlavix, aspirin, metoprolol, nitroglycerin" HLD. Stop simvastatin, start rosuvastatin. Repeat lipid and liver panel in 8 weeks w/ Dr. Rennis Golden follow up.  Medications: Outpatient Encounter Medications as of 12/30/2019  Medication Sig   acetaminophen (TYLENOL) 500 MG tablet Take 500 mg by mouth every 6 (six) hours as needed for mild pain or headache.   alendronate (FOSAMAX) 70 MG tablet Take 1 tablet (70 mg total) by mouth every 7 (seven) days. Take with a full glass of water on an empty stomach. (Patient taking differently: Take 70 mg by mouth every Sunday. Take with a full glass of water on an empty stomach.)   aspirin EC 81 MG EC tablet Take 1 tablet (81 mg total) by mouth daily.   calcium carbonate (OS-CAL) 1250 (500 Ca) MG chewable tablet Chew 1 tablet by mouth daily.   cetirizine (ZYRTEC) 10 MG chewable tablet Chew 10 mg by mouth daily as needed for allergies.   cholecalciferol (VITAMIN D) 1000 UNITS tablet Take 1,000 Units by mouth daily.   clopidogrel (PLAVIX) 75 MG tablet TAKE 1 TABLET BY MOUTH DAILY. (Patient taking differently: Take 75 mg by mouth daily. )   cyclobenzaprine (FLEXERIL) 10 MG tablet TAKE 1  TABLET BY MOUTH TWICE DAILY AS NEEDED FOR MUSCLE SPASMS (Patient taking differently: Take 10 mg by mouth daily as needed for muscle spasms (Back Spasms). )   diazepam (VALIUM) 2 MG tablet TAKE 1 TABLET BY MOUTH EVERY 8 HOURS AS NEEDED FOR MUSCLE SPASMS (Patient taking differently: Take 2 mg by mouth every 8 (eight) hours as needed for muscle spasms. )   diclofenac sodium (VOLTAREN) 1 % GEL Apply 2 g topically 4 (four) times daily. (Patient taking differently: Apply 2 g topically 3 (three) times daily. )   famotidine (PEPCID) 20 MG tablet TAKE 1 TABLET BY MOUTH 2 TIMES DAILY. (Patient taking differently: Take 20 mg by mouth daily as needed for heartburn. )   FLUoxetine (PROZAC) 20 MG capsule TAKE 1 CAPSULE BY MOUTH ONCE DAILY (Patient taking differently: Take 20 mg by mouth daily. )   fluticasone (FLONASE) 50 MCG/ACT nasal spray Place 1 spray into both nostrils daily as needed for allergies or rhinitis.   Icosapent Ethyl (VASCEPA) 1 g CAPS Take 2 capsules (2 g total) by mouth 2 (two) times daily. (Patient taking differently: Take 1 g by mouth 2 (two) times daily. )   metFORMIN (GLUCOPHAGE-XR) 500 MG 24 hr tablet TAKE 1 TABLET BY MOUTH ONCE A [place] WITH BREAKFAST   methocarbamol (ROBAXIN) 500 MG tablet TAKE 1 TABLET (500 MG TOTAL) BY MOUTH EVERY 8 (EIGHT) HOURS AS NEEDED FOR MUSCLE SPASMS.   metoprolol succinate (TOPROL-XL) 25 MG 24 hr tablet TAKE 1/2 OF A TABLET BY MOUTH DAILY.   Multiple Vitamin (  MULTIVITAMIN WITH MINERALS) TABS tablet Take 1 tablet by mouth daily.   nitroGLYCERIN (NITROSTAT) 0.4 MG SL tablet Place 1 tablet (0.4 mg total) under the tongue every 5 (five) minutes as needed for chest pain.   rosuvastatin (CRESTOR) 20 MG tablet Take 1 tablet (20 mg total) by mouth daily.   telmisartan (MICARDIS) 40 MG tablet Take 1 tablet (40 mg total) by mouth every morning. NEED OV. (Patient taking differently: Take 40 mg by mouth daily. )   Facility-Administered Encounter Medications as of  12/30/2019  Medication   sodium chloride flush (NS) 0.9 % injection 3 mL   Immunization History  Administered Date(s) Administered   Fluad Quad(high Dose 65+) 11/16/2018, 12/20/2019   Influenza Split 01/11/2016   Influenza,inj,Quad PF,6+ Mos 12/01/2013, 01/18/2015   Influenza-Unspecified 01/23/2018   PFIZER SARS-COV-2 Vaccination 04/20/2019, 05/13/2019, 12/21/2019   Pneumococcal Conjugate-13 01/18/2015   Pneumococcal Polysaccharide-23 07/12/2010, 04/21/2017   Td 11/16/2018   Tdap 09/15/2008   Patient reports receiving both covid vaccines  SDOH Screenings   Alcohol Screen:    Last Alcohol Screening Score (AUDIT): Not on file  Depression (PHQ2-9): Medium Risk   PHQ-2 Score: 8  Financial Resource Strain:    Difficulty of Paying Living Expenses: Not on file  Food Insecurity:    Worried About Programme researcher, broadcasting/film/videounning Out of Food in the Last Year: Not on file   [employer] Out of Food in the Last Year: Not on file  Housing:    Last Housing Risk Score: Not on file  Physical Activity:    Days of Exercise per Week: Not on file   Minutes of Exercise per Session: Not on file  Social Connections:    Frequency of Communication with Friends and Family: Not on file   Frequency of Social Gatherings with Friends and Family: Not on file   Attends Religious Services: Not on file   Active Member of Clubs or Organizations: Not on file   Attends BankerClub or Organization Meetings: Not on file   Marital Status: Not on file  Stress:    Feeling of Stress : Not on file  Tobacco Use: Low Risk    Smoking Tobacco Use: Never Smoker   Smokeless Tobacco Use: Never Used  Transportation Needs:    Freight forwarderLack of Transportation (Medical): Not on file   Lack of Transportation (Non-Medical): Not on file    Current Diagnosis/Assessment:  Goals Addressed            This Visit's Progress    Chronic Care Management Pharmacy Care Plan       CARE PLAN ENTRY (see longitudinal plan of care for additional care  plan information)  Current Barriers:   Chronic Disease Management support, education, and care coordination needs related to DM, HTN, HLD, CAD, Depression, GERD, Osteopenia, Obesity, Allergic Rhinitis, Pain   Hypertension BP Readings from Last 3 Encounters:  12/29/19 (!) 148/60  12/20/19 122/60  12/10/19 (!) 147/56    Pharmacist Clinical Goal(s): o Over the next 90 days, patient will work with PharmD and providers to achieve BP goal <130/80  Current regimen:   Telmisartan 40mg  daily  Metoprolol succinate 25mg  1/2 tab daily  Interventions: o Requested patient to check blood pressure 3-4 times per week and record  Patient self care activities - Over the next 90 days, patient will: o Check BP 3-4 times per week, document, and provide at future appointments o Ensure daily salt intake < 2300 mg/Willam Munford  Hyperlipidemia Lab Results  Component Value Date/Time   [facility]  12/20/2019  09:45 AM     Comment:     . LDL cholesterol not calculated. Triglyceride levels greater than 400 mg/dL invalidate calculated LDL results. . Reference range: <100 . Desirable range <100 mg/dL for primary prevention;   <70 mg/dL for patients with CHD or diabetic patients  with > or = 2 CHD risk factors. Marland Kitchen LDL-C is now calculated using the Martin-Hopkins  calculation, which is a validated novel method providing  better accuracy than the Friedewald equation in the  estimation of LDL-C.  Horald Pollen et al. Lenox Ahr. 7017;793(90): 2061-2068  (http://education.[facility].com/faq/FAQ164)    LDLDIRECT 82.0 11/16/2018 10:10 AM    Pharmacist Clinical Goal(s): o Over the next 90 days, patient will work with PharmD and providers to achieve LDL goal < 70  Current regimen:   Rosuvastatin 20mg  daily (changed from simvastatin per cardio on 12/29/19)  Vascepa 1g #1 BID  Interventions: o Encouraged adherence to statin therapy  Patient self care activities - Over the next 90 days, patient will: o Maintain  cholesterol medication regimen. o Keep appt to get lipids rechecked   Diabetes Lab Results  Component Value Date/Time   HGBA1C 7.0 (H) 12/20/2019 09:45 AM   HGBA1C 6.8 (H) 07/01/2019 09:21 AM    Pharmacist Clinical Goal(s): o Over the next 90 days, patient will work with PharmD and providers to achieve A1c goal <7%  Current regimen:  o Metformin ER 500mg  daily  Interventions: o Discussed importance of diet and exercise  Patient self care activities - Over the next 90 days, patient will: o Check blood sugar once daily, document, and provide at future appointments o Contact provider with any episodes of hypoglycemia  Medication management  Pharmacist Clinical Goal(s): o Over the next 180 days, patient will work with PharmD and providers to achieve optimal medication adherence  Current pharmacy: 08/31/2019 Outpatient Pharmacy  Interventions o Comprehensive medication review performed. o Continue current medication management strategy  Patient self care activities - Over the next 180 days, patient will: o Focus on medication adherence by filling and taking medications appropriately  o Take medications as prescribed o Report any questions or concerns to PharmD and/or provider(s)  Please see past updates related to this goal by clicking on the "Past Updates" button in the selected goal        Social Hx: 2 Children (oldest - son), 2nd is daughter. Has a 80 year old grandaughter, 29 year old 26. 2 great grand daughters   She is really stressed with her husband's health. He had a perforation and heart issues. Has had multiple appts and she is working to manage his medical care more than hers.  Update 12/30/19 Going to the [place] with 4 other couples   Diabetes   Goal < 7%  Recent Relevant Labs: Lab Results  Component Value Date/Time   HGBA1C 7.0 (H) 12/20/2019 09:45 AM   HGBA1C 6.8 (H) 07/01/2019 09:21 AM   MICROALBUR 0.8 11/16/2018 10:10 AM    MICROALBUR 0.2 09/29/2014 09:10 AM    Checking BG: Weekly   113 lowest  168 highest (after strawberry cake with birthday celebrations) Eating more veggies and less pasta  Went on a vacation with another couple.  Patient is currently borderline controlled and improved on the following medications:   Metformin ER 500mg  daily  Denies any symptoms of hypoglycemia. Thinks any symptoms she has faced were due more to anxiety/stress with her husband's health. Congratulated patient on lowered a1c  Last diabetic Eye exam:  Lab Results  Component Value Date/Time   HMDIABEYEEXA No Retinopathy 07/27/2015 09:06 AM  Diabetic Eye Exam 03/01/19 at [facility] = No diabetic retinopathy OU  Last diabetic Foot exam: No results found for: HMDIABFOOTEX   We discussed: diet and exercise extensively   Update 12/30/19 A1c trending up.  Any new changes? Stress with COVID related deaths. States her FBG are running in 120s  Plan -Check blood sugar 3-4 times per week and record readings -Continue current medications    Hypertension   Blood Pressure Goal <130/80  Office blood pressures are  BP Readings from Last 3 Encounters:  12/29/19 (!) 148/60  12/20/19 122/60  12/10/19 (!) 147/56   Patient has failed these meds in the past: hctz  Patient is currently uncontrolled, but may be trending up due to stress on the following medications:  Telmisartan 40mg  daily  Metoprolol succinate 25mg  1/2 tab daily  Patient checks BP at home when feeling symptomatic  Patient home BP readings are ranging: Unable to assess  Update 08/26/19 Checked BP this morning and it was 160 something over 70. States she had her machine calibrated with Dr. machine and it was accurate, so she trusts the machine.  Denies chest pain or dizziness. Does report head aches. She states she has had a tightening in her chest, but feels it is related to anxiety because she can talk herself down  and be fine.   We discussed Proper blood pressure measurement technique  Update 12/30/19 Reports BP of 130-140/60 States she threw away specific readings  Plan -Continue checking blood pressure 3-4 times per week -Continue current medications    Hyperlipidemia   LDL Goal <70 (82 to 53 to unable to calculate due to high TRIG) TG Goal <150 (269 to 154 to 511) HDL Goal >50 (32.9 to 31.9 to 33)  Lipid Panel     Component Value Date/Time   CHOL 209 (H) 12/20/2019 0945   TRIG 511 (H) 12/20/2019 0945   HDL 33 (L) 12/20/2019 0945   CHOLHDL 6.3 (H) 12/20/2019 0945   VLDL 30.8 07/01/2019 0921   LDLCALC  12/20/2019 0945     Comment:     . LDL cholesterol not calculated. Triglyceride levels greater than 400 mg/dL invalidate calculated LDL results. . Reference range: <100 . Desirable range <100 mg/dL for primary prevention;   <70 mg/dL for patients with CHD or diabetic patients  with > or = 2 CHD risk factors. 08/31/2019 LDL-C is now calculated using the Martin-Hopkins  calculation, which is a validated novel method providing  better accuracy than the Friedewald equation in the  estimation of LDL-C.  12/22/2019 et al. Marland Kitchen. Horald Pollen): 2061-2068  (http://education.[facility].com/faq/FAQ164)    LDLDIRECT 82.0 11/16/2018 1010     ASCVD 10-year risk: Hx of ASCVD  Patient has failed these meds in past: None noted Patient is currently uncontrolled on the following medications:   Rosuvastatin 20mg  daily (changed from simvastatin per cardio on 12/29/19)  Vascepa 1g #1 BID (report flatulence with #2 BID, ok with #1BID per cardio)  States she will start taking rosuvastatin tonight.  She is grateful to know her LAD is still patent.  Feels her most recent panel was elevated due to not fasting. Admits to COVID related deaths of people in her home town. This has been worrisome for her. Hasn't had to reuse NTG since her incident while shopping  Plan -Keep lab visit to recheck lipid  panel -Continue current medications   Depression  Depression screen [facility] 2/9 12/30/2019 07/01/2019 05/19/2017  Decreased Interest 2 0 0  Down, Depressed, Hopeless 0 0 1  PHQ - 2 Score 2 0 1  Altered sleeping 3 - -  Tired, decreased energy 3 - -  Change in appetite 0 - -  Feeling bad or failure about yourself  0 - -  Trouble concentrating 0 - -  Moving slowly or fidgety/restless 0 - -  Suicidal thoughts 0 - -  PHQ-9 Score 8 - -  Some recent data might be hidden    Patient has failed these meds in past: None noted Patient is currently controlled on the following medications:   Fluoxetine 20mg  daily  Reports she is not depressed. Suspect her tiredness is a result of her cardiac symptoms  Plan -Continue current medications  Obesity    Patient has failed these meds in past: None noted Patient is currently stable on the following medications: None  Has maintained 10lb weight loss.  Congratulated patient on maintaining progress in the setting of stress.   We discussed:  Importance of maintaining goals in the midst of stress   Update 12/30/19 Avoiding fried foods. Doing Optivia protein bars and shakes.  States she has lost a few pounds since last visit. Now at 204lbs.  Feels her 2xl clothes are feeling bigger.  Has not exercised as much.  Was recommended to slowly increase her exercise as tolerated.  Plan -Continue control with diet and exercise  03-03-1992, PharmD Clinical Pharmacist [facility] [phone]

## 2019-12-30 NOTE — Patient Instructions (Signed)
Visit Information  Goals Addressed            This Visit's Progress   . Chronic Care Management Pharmacy Care Plan       CARE PLAN ENTRY (see longitudinal plan of care for additional care plan information)  Current Barriers:  . Chronic Disease Management support, education, and care coordination needs related to DM, HTN, HLD, CAD, Depression, GERD, Osteopenia, Obesity, Allergic Rhinitis, Pain   Hypertension BP Readings from Last 3 Encounters:  12/29/19 (!) 148/60  12/20/19 122/60  12/10/19 (!) 147/56   . Pharmacist Clinical Goal(s): o Over the next 90 days, patient will work with PharmD and providers to achieve BP goal <130/80 . Current regimen:   Telmisartan 40mg  daily  Metoprolol succinate 25mg  1/2 tab daily . Interventions: o Requested patient to check blood pressure 3-4 times per week and record . Patient self care activities - Over the next 90 days, patient will: o Check BP 3-4 times per week, document, and provide at future appointments o Ensure daily salt intake < 2300 mg/Elenora Hawbaker  Hyperlipidemia Lab Results  Component Value Date/Time   [facility]  12/20/2019 09:45 AM     Comment:     . LDL cholesterol not calculated. Triglyceride levels greater than 400 mg/dL invalidate calculated LDL results. . Reference range: <100 . Desirable range <100 mg/dL for primary prevention;   <70 mg/dL for patients with CHD or diabetic patients  with > or = 2 CHD risk factors. [facility] LDL-C is now calculated using the Martin-Hopkins  calculation, which is a validated novel method providing  better accuracy than the Friedewald equation in the  estimation of LDL-C.  12/22/2019 et al. Marland Kitchen. Horald Pollen): 2061-2068  (http://education.[facility].com/faq/FAQ164)    LDLDIRECT 82.0 11/16/2018 10:10 AM   . Pharmacist Clinical Goal(s): o Over the next 90 days, patient will work with PharmD and providers to achieve LDL goal < 70 . Current regimen:   Rosuvastatin 20mg  daily (changed from  simvastatin per cardio on 12/29/19)  Vascepa 1g #1 BID . Interventions: o Encouraged adherence to statin therapy . Patient self care activities - Over the next 90 days, patient will: o Maintain cholesterol medication regimen. o Keep appt to get lipids rechecked   Diabetes Lab Results  Component Value Date/Time   HGBA1C 7.0 (H) 12/20/2019 09:45 AM   HGBA1C 6.8 (H) 07/01/2019 09:21 AM   . Pharmacist Clinical Goal(s): o Over the next 90 days, patient will work with PharmD and providers to achieve A1c goal <7% . Current regimen:  o Metformin ER 500mg  daily . Interventions: o Discussed importance of diet and exercise . Patient self care activities - Over the next 90 days, patient will: o Check blood sugar once daily, document, and provide at future appointments o Contact provider with any episodes of hypoglycemia  Medication management . Pharmacist Clinical Goal(s): o Over the next 180 days, patient will work with PharmD and providers to achieve optimal medication adherence . Current pharmacy: [facility] . Interventions o Comprehensive medication review performed. o Continue current medication management strategy . Patient self care activities - Over the next 180 days, patient will: o Focus on medication adherence by filling and taking medications appropriately  o Take medications as prescribed o Report any questions or concerns to PharmD and/or provider(s)  Please see past updates related to this goal by clicking on the "Past Updates" button in the selected goal         The patient verbalized understanding of instructions provided  today and agreed to receive a mailed copy of patient instruction and/or educational materials.  Telephone follow up appointment with pharmacy team member scheduled for: 03/31/2020  Dannielle Karvonen Becka Lagasse, PharmD Clinical Pharmacist [place] [facility] [phone]

## 2020-01-03 ENCOUNTER — Other Ambulatory Visit: Payer: Self-pay | Admitting: Internal Medicine

## 2020-01-03 MED FILL — ALENDRONATE NA 70 MG TAB: 70 | 84 days supply | Qty: 12 | Fill #2

## 2020-01-03 MED FILL — CLOPIDOGREL 75 MG TABLET: 75 | 90 days supply | Qty: 90 | Fill #3

## 2020-01-04 ENCOUNTER — Other Ambulatory Visit: Payer: Self-pay | Admitting: Internal Medicine

## 2020-01-04 MED FILL — TELMISARTAN 40 MG TABS: 40 | 30 days supply | Qty: 30 | Fill #0

## 2020-01-07 ENCOUNTER — Ambulatory Visit: Payer: PPO | Admitting: Internal Medicine

## 2020-01-11 ENCOUNTER — Other Ambulatory Visit: Payer: Self-pay

## 2020-01-11 MED ORDER — TELMISARTAN 40 MG PO TABS
ORAL_TABLET | ORAL | 11 refills | Status: DC
Start: 2020-01-11 — End: 2020-10-18

## 2020-01-14 ENCOUNTER — Telehealth: Payer: Self-pay | Admitting: Pharmacist

## 2020-01-14 NOTE — Progress Notes (Addendum)
Chronic Care Management Pharmacy Assistant   Name: [name]  MRN: [id number] DOB: Mar 24, 1951  Reason for Encounter: Medication Review/General Adherence  Patient Questions:  1.  Have you seen any other providers since your last visit?   2.  Any changes in your medicines or health?     PCP : Pearline Cables, MD  Allergies:   Allergies  Allergen Reactions   Niacin And Related Other (See Comments)    Whelps and skin flushed, mouth tingling    Medications: Outpatient Encounter Medications as of 01/14/2020  Medication Sig   acetaminophen (TYLENOL) 500 MG tablet Take 500 mg by mouth every 6 (six) hours as needed for mild pain or headache.   alendronate (FOSAMAX) 70 MG tablet Take 1 tablet (70 mg total) by mouth every 7 (seven) days. Take with a full glass of water on an empty stomach. (Patient taking differently: Take 70 mg by mouth every Sunday. Take with a full glass of water on an empty stomach.)   aspirin EC 81 MG EC tablet Take 1 tablet (81 mg total) by mouth daily.   calcium carbonate (OS-CAL) 1250 (500 Ca) MG chewable tablet Chew 1 tablet by mouth daily.   cetirizine (ZYRTEC) 10 MG chewable tablet Chew 10 mg by mouth daily as needed for allergies.   cholecalciferol (VITAMIN D) 1000 UNITS tablet Take 1,000 Units by mouth daily.   clopidogrel (PLAVIX) 75 MG tablet TAKE 1 TABLET BY MOUTH DAILY. (Patient taking differently: Take 75 mg by mouth daily. )   cyclobenzaprine (FLEXERIL) 10 MG tablet TAKE 1 TABLET BY MOUTH TWICE DAILY AS NEEDED FOR MUSCLE SPASMS (Patient taking differently: Take 10 mg by mouth daily as needed for muscle spasms (Back Spasms). )   diazepam (VALIUM) 2 MG tablet TAKE 1 TABLET BY MOUTH EVERY 8 HOURS AS NEEDED FOR MUSCLE SPASMS (Patient taking differently: Take 2 mg by mouth every 8 (eight) hours as needed for muscle spasms. )   diclofenac sodium (VOLTAREN) 1 % GEL Apply 2 g topically 4 (four) times daily. (Patient taking differently: Apply 2 g topically 3  (three) times daily. )   famotidine (PEPCID) 20 MG tablet TAKE 1 TABLET BY MOUTH 2 TIMES DAILY. (Patient taking differently: Take 20 mg by mouth daily as needed for heartburn. )   FLUoxetine (PROZAC) 20 MG capsule TAKE 1 CAPSULE BY MOUTH ONCE DAILY (Patient taking differently: Take 20 mg by mouth daily. )   fluticasone (FLONASE) 50 MCG/ACT nasal spray Place 1 spray into both nostrils daily as needed for allergies or rhinitis.   Icosapent Ethyl (VASCEPA) 1 g CAPS Take 2 capsules (2 g total) by mouth 2 (two) times daily. (Patient taking differently: Take 1 g by mouth 2 (two) times daily. )   metFORMIN (GLUCOPHAGE-XR) 500 MG 24 hr tablet TAKE 1 TABLET BY MOUTH ONCE A DAY WITH BREAKFAST   methocarbamol (ROBAXIN) 500 MG tablet TAKE 1 TABLET (500 MG TOTAL) BY MOUTH EVERY 8 (EIGHT) HOURS AS NEEDED FOR MUSCLE SPASMS.   metoprolol succinate (TOPROL-XL) 25 MG 24 hr tablet TAKE 1/2 OF A TABLET BY MOUTH DAILY.   Multiple Vitamin (MULTIVITAMIN WITH MINERALS) TABS tablet Take 1 tablet by mouth daily.   nitroGLYCERIN (NITROSTAT) 0.4 MG SL tablet Place 1 tablet (0.4 mg total) under the tongue every 5 (five) minutes as needed for chest pain.   rosuvastatin (CRESTOR) 20 MG tablet Take 1 tablet (20 mg total) by mouth daily.   telmisartan (MICARDIS) 40 MG tablet TAKE 1  TABLET BY MOUTH EVERY MORNING   Facility-Administered Encounter Medications as of 01/14/2020  Medication   sodium chloride flush (NS) 0.9 % injection 3 mL    Current Diagnosis: Patient Active Problem List   Diagnosis Date Noted   Obstructive sleep apnea treated with continuous positive airway pressure (CPAP) 05/07/2018   Chest pain 01/31/2018   Morbid obesity (HCC) 11/03/2017   Coronary artery disease involving native coronary artery of native heart with unstable angina pectoris (HCC) 11/03/2017   Insomnia 11/03/2017   Inadequate sleep hygiene 11/03/2017   Anxiety 07/21/2017   Dizziness 10/02/2016   Right patella fracture 08/29/2016    Acquired foot deformity, left 07/18/2016   Osteopenia 11/10/2015   HNP (herniated nucleus pulposus), lumbar 10/12/2014   Spinal stenosis at L4-L5 level 10/12/2014   Iron deficiency anemia 07/29/2014   DM (diabetes mellitus) with complications (HCC) 07/29/2014   Environmental and seasonal allergies 04/28/2014   Spinal stenosis, lumbar region, with neurogenic claudication 01/06/2013   Obesity, morbid, BMI 40.0-49.9 (HCC) 10/20/2012   Chest pain with moderate risk of acute coronary syndrome 10/02/2012   CAD S/P percutaneous coronary angioplasty    Hyperlipidemia with target LDL less than 70    Essential hypertension 04/16/2012    Goals Addressed   None    12-29-19 Patient was seen by Cardiologist Edd Fabian.  Patient reported only taking 1 g of Vascepa in the morning and 1 g at night due to flatulence side effects. Provider recommended patient to increase fiber and stop simvastatin and start rosuvastatin 20 mg Called patient and discussed Rosuvastatin 20 mg medication with patient.  Patient stated the medication was prescribed on the 12-29-19 and she started medication on 12-30-19 evening. Patient stated medication does make her bilateral hands and knees stiff.  States stiffness will go away throughout the day.  States symptoms are only in the morning.  Denies any side effects with current medications.  Denies any recent ED visits.  Denies any problems with her current pharmacy( Outpatient pharmacy).  Follow-Up:  Pharmacist Review   Armenia Shannon, RMA [place] [facility] Clinical Pharmacist Assistant [phone]  Reviewed by: Katrinka Blazing, PharmD Clinical Pharmacist  Primary Care at [facility] [phone]

## 2020-01-28 DIAGNOSIS — H903 Sensorineural hearing loss, bilateral: Secondary | ICD-10-CM | POA: Diagnosis not present

## 2020-02-03 ENCOUNTER — Telehealth: Payer: Self-pay | Admitting: Pharmacist

## 2020-02-03 NOTE — Progress Notes (Addendum)
Chronic Care Management Pharmacy Assistant   Name: [name]  MRN: [id number] DOB: 1950-12-08  Reason for Encounter: Disease State  Patient Questions:  1.  Have you seen any other providers since your last visit? No  2.  Any changes in your medicines or health? No  PCP : Copland, [name], MD   Their chronic conditions include: DM, HTN, HLD, CAD, Depression, GERD, Osteopenia, Obesity, Allergic Rhinitis, Pain  No Office visits, Consults or Hospitalizations since last CCM visit on 01-14-20.  Allergies:   Allergies  Allergen Reactions  . Niacin And Related Other (See Comments)    Whelps and skin flushed, mouth tingling    Medications: Outpatient Encounter Medications as of 02/03/2020  Medication Sig  . acetaminophen (TYLENOL) 500 MG tablet Take 500 mg by mouth every 6 (six) hours as needed for mild pain or headache.  . alendronate (FOSAMAX) 70 MG tablet Take 1 tablet (70 mg total) by mouth every 7 (seven) days. Take with a full glass of water on an empty stomach. (Patient taking differently: Take 70 mg by mouth every Sunday. Take with a full glass of water on an empty stomach.)  . aspirin EC 81 MG EC tablet Take 1 tablet (81 mg total) by mouth daily.  . calcium carbonate (OS-CAL) 1250 (500 Ca) MG chewable tablet Chew 1 tablet by mouth daily.  . cetirizine (ZYRTEC) 10 MG chewable tablet Chew 10 mg by mouth daily as needed for allergies.  . cholecalciferol (VITAMIN D) 1000 UNITS tablet Take 1,000 Units by mouth daily.  . clopidogrel (PLAVIX) 75 MG tablet TAKE 1 TABLET BY MOUTH DAILY. (Patient taking differently: Take 75 mg by mouth daily. )  . cyclobenzaprine (FLEXERIL) 10 MG tablet TAKE 1 TABLET BY MOUTH TWICE DAILY AS NEEDED FOR MUSCLE SPASMS (Patient taking differently: Take 10 mg by mouth daily as needed for muscle spasms (Back Spasms). )  . diazepam (VALIUM) 2 MG tablet TAKE 1 TABLET BY MOUTH EVERY 8 HOURS AS NEEDED FOR MUSCLE SPASMS (Patient taking differently: Take 2 mg  by mouth every 8 (eight) hours as needed for muscle spasms. )  . diclofenac sodium (VOLTAREN) 1 % GEL Apply 2 g topically 4 (four) times daily. (Patient taking differently: Apply 2 g topically 3 (three) times daily. )  . famotidine (PEPCID) 20 MG tablet TAKE 1 TABLET BY MOUTH 2 TIMES DAILY. (Patient taking differently: Take 20 mg by mouth daily as needed for heartburn. )  . FLUoxetine (PROZAC) 20 MG capsule TAKE 1 CAPSULE BY MOUTH ONCE DAILY (Patient taking differently: Take 20 mg by mouth daily. )  . fluticasone (FLONASE) 50 MCG/ACT nasal spray Place 1 spray into both nostrils daily as needed for allergies or rhinitis.  Bess [place]vest Ethyl (VASCEPA) 1 g CAPS Take 2 capsules (2 g total) by mouth 2 (two) times daily. (Patient taking differently: Take 1 g by mouth 2 (two) times daily. )  . metFORMIN (GLUCOPHAGE-XR) 500 MG 24 hr tablet TAKE 1 TABLET BY MOUTH ONCE A DAY WITH BREAKFAST  . methocarbamol (ROBAXIN) 500 MG tablet TAKE 1 TABLET (500 MG TOTAL) BY MOUTH EVERY 8 (EIGHT) HOURS AS NEEDED FOR MUSCLE SPASMS.  . metoprolol succinate (TOPROL-XL) 25 MG 24 hr tablet TAKE 1/2 OF A TABLET BY MOUTH DAILY.  . Multiple Vitamin (MULTIVITAMIN WITH MINERALS) TABS tablet Take 1 tablet by mouth daily.  . nitroGLYCERIN (NITROSTAT) 0.4 MG SL tablet Place 1 tablet (0.4 mg total) under the tongue every 5 (five) minutes as needed for  chest pain.  . rosuvastatin (CRESTOR) 20 MG tablet Take 1 tablet (20 mg total) by mouth daily.  Marland Kitchen telmisartan (MICARDIS) 40 MG tablet TAKE 1 TABLET BY MOUTH EVERY MORNING   Facility-Administered Encounter Medications as of 02/03/2020  Medication  . sodium chloride flush (NS) 0.9 % injection 3 mL    Current Diagnosis: Patient Active Problem List   Diagnosis Date Noted  . Obstructive sleep apnea treated with continuous positive airway pressure (CPAP) 05/07/2018  . Chest pain 01/31/2018  . Morbid obesity (HCC) 11/03/2017  . Coronary artery disease involving native coronary artery of  native heart with unstable angina pectoris (HCC) 11/03/2017  . Insomnia 11/03/2017  . Inadequate sleep hygiene 11/03/2017  . Anxiety 07/21/2017  . Dizziness 10/02/2016  . Right patella fracture 08/29/2016  . Acquired foot deformity, left 07/18/2016  . Osteopenia 11/10/2015  . HNP (herniated nucleus pulposus), lumbar 10/12/2014  . Spinal stenosis at L4-L5 level 10/12/2014  . Iron deficiency anemia 07/29/2014  . DM (diabetes mellitus) with complications (HCC) 07/29/2014  . Environmental and seasonal allergies 04/28/2014  . Spinal stenosis, lumbar region, with neurogenic claudication 01/06/2013  . Obesity, morbid, BMI 40.0-49.9 (HCC) 10/20/2012  . Chest pain with moderate risk of acute coronary syndrome 10/02/2012  . CAD S/P percutaneous coronary angioplasty   . Hyperlipidemia with target LDL less than 70   . Essential hypertension 04/16/2012    Goals Addressed   None    Reviewed chart prior to disease state call. Spoke with patient regarding BP  Recent Office Vitals: BP Readings from Last 3 Encounters:  12/29/19 (!) 148/60  12/20/19 122/60  12/10/19 (!) 147/56   Pulse Readings from Last 3 Encounters:  12/29/19 61  12/20/19 61  12/10/19 61    Wt Readings from Last 3 Encounters:  12/29/19 206 lb (93.4 kg)  12/20/19 207 lb (93.9 kg)  12/10/19 208 lb (94.3 kg)     Kidney Function Lab Results  Component Value Date/Time   CREATININE 0.81 11/18/2019 02:18 PM   CREATININE 0.75 07/01/2019 09:21 AM   CREATININE 0.82 09/29/2014 09:10 AM   CREATININE 0.73 09/27/2013 08:46 AM   GFR 76.46 07/01/2019 09:21 AM   GFRNONAA >60 11/18/2019 02:18 PM   GFRAA >60 11/18/2019 02:18 PM    BMP Latest Ref Rng & Units 11/18/2019 07/01/2019 11/16/2018  Glucose 70 - 99 mg/dL 694(W) 546(E) 703(J)  BUN 8 - 23 mg/dL 17 17 14   Creatinine 0.44 - 1.00 mg/dL 0.09 3.81  Sodium 135 - 145 mmol/L 139 139 140  Potassium 3.5 - 5.1 mmol/L 4.1 4.1 4.7  Chloride 98 - 111 mmol/L 103 105 102  CO2 22 -  32 mmol/L 24 25 30   Calcium 8.9 - 10.3 mg/dL 9.7 9.3 8.29    . Current antihypertensive regimen:   Telmisartan 40mg  daily  Metoprolol succinate 25mg  1/2 tab daily . How often are you checking your Blood Pressure? several times per month . Current home BP readings: None . What recent interventions/DTPs have been made by any provider to improve Blood Pressure control since last CPP Visit: None . Any recent hospitalizations or ED visits since last visit with CPP? No . What diet changes have been made to improve Blood Pressure Control?  o Reports she has been eating more bread latlely . What exercise is being done to improve your Blood Pressure Control?  o Reports she is currently at the beach and is walking around  Adherence Review: reports she is now on Crestor and is having  muscle (left hand ring finger) and back pain. Is the patient currently on ACE/ARB medication? Yes Telmisartan 40 mg Does the patient have >5 day gap between last estimated fill dates? No    Follow-Up:  Pharmacist Review   Armenia Shannon, RMA [place] [facility] Clinical Pharmacist Assistant [phone]  Wouldn't suspect rosuvastatin to cause more muscle side effects than simvastatin. Will continue to monitor.   Reviewed by: Katrinka Blazing, PharmD Clinical Pharmacist [place] [facility] at [facility] [phone]

## 2020-02-08 ENCOUNTER — Other Ambulatory Visit: Payer: Self-pay | Admitting: Family Medicine

## 2020-02-08 DIAGNOSIS — E785 Hyperlipidemia, unspecified: Secondary | ICD-10-CM

## 2020-02-09 ENCOUNTER — Other Ambulatory Visit: Payer: Self-pay | Admitting: Family Medicine

## 2020-02-09 MED FILL — VASCEPA 1 GM CAPSULE: 1 | 30 days supply | Qty: 120 | Fill #0

## 2020-02-09 MED FILL — TELMISARTAN 40 MG TABS: 40 | 30 days supply | Qty: 30 | Fill #1

## 2020-02-23 ENCOUNTER — Other Ambulatory Visit: Payer: Self-pay | Admitting: Family Medicine

## 2020-02-23 DIAGNOSIS — Z1231 Encounter for screening mammogram for malignant neoplasm of breast: Secondary | ICD-10-CM

## 2020-02-24 MED FILL — FLUoxetine HCL 20 MG CAPS: 20 | 90 days supply | Qty: 90 | Fill #1

## 2020-02-24 MED FILL — TELMISARTAN 40 MG TABS: 40 | 30 days supply | Qty: 30 | Fill #2

## 2020-03-02 DIAGNOSIS — Z961 Presence of intraocular lens: Secondary | ICD-10-CM | POA: Diagnosis not present

## 2020-03-02 DIAGNOSIS — H524 Presbyopia: Secondary | ICD-10-CM | POA: Diagnosis not present

## 2020-03-02 DIAGNOSIS — H52202 Unspecified astigmatism, left eye: Secondary | ICD-10-CM | POA: Diagnosis not present

## 2020-03-02 DIAGNOSIS — E119 Type 2 diabetes mellitus without complications: Secondary | ICD-10-CM | POA: Diagnosis not present

## 2020-03-20 MED FILL — METOPROLOL SUCCINATE ER 25: 25 | 90 days supply | Qty: 45 | Fill #1

## 2020-03-21 ENCOUNTER — Telehealth: Payer: Self-pay | Admitting: Pharmacist

## 2020-03-21 NOTE — Progress Notes (Addendum)
Reviewing the patient's chart for medication adherence information specific to Telmisartan 40 MG tablet. Ms. [name] shared with me she lost a half a bottle of her medication while on a beach trip last month, and that threw her dates off. She went a few days without the Telmisartan because her insurance would not initially fill another script so soon. Finally she was able to get the medicine. Patient reported calling to fill the medication on Friday December 24th at [facility]. They have a delivery service that she suspects will be providing her medication tomorrow.  Corwin Levins, CMA Clinical Pharmacist Assistant [phone]  Reviewed by: Katrinka Blazing, PharmD, BCACP Clinical Pharmacist [place] [facility] at [facility] [phone]

## 2020-03-24 MED FILL — TELMISARTAN 40 MG TABS: 40 | 30 days supply | Qty: 30 | Fill #3

## 2020-03-31 ENCOUNTER — Other Ambulatory Visit: Payer: Self-pay | Admitting: Family Medicine

## 2020-03-31 ENCOUNTER — Ambulatory Visit: Payer: PPO | Admitting: Pharmacist

## 2020-03-31 DIAGNOSIS — E119 Type 2 diabetes mellitus without complications: Secondary | ICD-10-CM

## 2020-03-31 DIAGNOSIS — E118 Type 2 diabetes mellitus with unspecified complications: Secondary | ICD-10-CM

## 2020-03-31 DIAGNOSIS — I10 Essential (primary) hypertension: Secondary | ICD-10-CM

## 2020-03-31 DIAGNOSIS — E785 Hyperlipidemia, unspecified: Secondary | ICD-10-CM

## 2020-03-31 MED FILL — metFORMIN HCL ER 500 MG TB2: 500 | 30 days supply | Qty: 30 | Fill #0

## 2020-03-31 MED FILL — VASCEPA 1 GM CAPSULE: 1 | 30 days supply | Qty: 120 | Fill #1

## 2020-03-31 NOTE — Chronic Care Management (AMB) (Unsigned)
Chronic Care Management Pharmacy  Name: [name]  MRN: [id number] DOB: October 21, 1950  Chief Complaint/ HPI  [name],  70 y.o. , female presents for their Follow-Up CCM visit with the clinical pharmacist via telephone due to COVID-19 Pandemic.  PCP : Pearline Cables, MD  Their chronic conditions include: DM, HTN, HLD, CAD, Depression, GERD, Osteopenia, Obesity, Allergic Rhinitis, Pain  Office Visits: None since last CCM visit on 12/30/19.   Consult Visit: 03/02/20: Ophthalmology visit w/ Dr. Nile Riggs - No diabetic retinopathy OU  Medications: Outpatient Encounter Medications as of 03/31/2020  Medication Sig  . acetaminophen (TYLENOL) 500 MG tablet Take 500 mg by mouth every 6 (six) hours as needed for mild pain or headache.  . alendronate (FOSAMAX) 70 MG tablet Take 1 tablet (70 mg total) by mouth every 7 (seven) days. Take with a full glass of water on an empty stomach. (Patient taking differently: Take 70 mg by mouth every Sunday. Take with a full glass of water on an empty stomach.)  . aspirin EC 81 MG EC tablet Take 1 tablet (81 mg total) by mouth daily.  . calcium carbonate (OS-CAL) 1250 (500 Ca) MG chewable tablet Chew 1 tablet by mouth daily.  . cetirizine (ZYRTEC) 10 MG chewable tablet Chew 10 mg by mouth daily as needed for allergies.  . cholecalciferol (VITAMIN D) 1000 UNITS tablet Take 1,000 Units by mouth daily.  . clopidogrel (PLAVIX) 75 MG tablet TAKE 1 TABLET BY MOUTH DAILY. (Patient taking differently: Take 75 mg by mouth daily. )  . cyclobenzaprine (FLEXERIL) 10 MG tablet TAKE 1 TABLET BY MOUTH TWICE DAILY AS NEEDED FOR MUSCLE SPASMS (Patient taking differently: Take 10 mg by mouth daily as needed for muscle spasms (Back Spasms). )  . diazepam (VALIUM) 2 MG tablet TAKE 1 TABLET BY MOUTH EVERY 8 HOURS AS NEEDED FOR MUSCLE SPASMS (Patient taking differently: Take 2 mg by mouth every 8 (eight) hours as needed for muscle spasms. )  . diclofenac sodium (VOLTAREN) 1 %  GEL Apply 2 g topically 4 (four) times daily. (Patient taking differently: Apply 2 g topically 3 (three) times daily. )  . famotidine (PEPCID) 20 MG tablet TAKE 1 TABLET BY MOUTH 2 TIMES DAILY. (Patient taking differently: Take 20 mg by mouth daily as needed for heartburn. )  . FLUoxetine (PROZAC) 20 MG capsule TAKE 1 CAPSULE BY MOUTH ONCE DAILY (Patient taking differently: Take 20 mg by mouth daily. )  . fluticasone (FLONASE) 50 MCG/ACT nasal spray Place 1 spray into both nostrils daily as needed for allergies or rhinitis.  . methocarbamol (ROBAXIN) 500 MG tablet TAKE 1 TABLET (500 MG TOTAL) BY MOUTH EVERY 8 (EIGHT) HOURS AS NEEDED FOR MUSCLE SPASMS.  . metoprolol succinate (TOPROL-XL) 25 MG 24 hr tablet TAKE 1/2 OF A TABLET BY MOUTH DAILY.  . Multiple Vitamin (MULTIVITAMIN WITH MINERALS) TABS tablet Take 1 tablet by mouth daily.  . nitroGLYCERIN (NITROSTAT) 0.4 MG SL tablet Place 1 tablet (0.4 mg total) under the tongue every 5 (five) minutes as needed for chest pain.  . simvastatin (ZOCOR) 40 MG tablet Take 40 mg by mouth daily.  Marland Kitchen telmisartan (MICARDIS) 40 MG tablet TAKE 1 TABLET BY MOUTH EVERY MORNING  . VASCEPA 1 g capsule TAKE 2 CAPSULES BY MOUTH TWICE DAILY  . [DISCONTINUED] metFORMIN (GLUCOPHAGE-XR) 500 MG 24 hr tablet TAKE 1 TABLET BY MOUTH ONCE A [name] WITH BREAKFAST   Facility-Administered Encounter Medications as of 03/31/2020  Medication  . sodium chloride  flush (NS) 0.9 % injection 3 mL   Immunization History  Administered Date(s) Administered  . Fluad Quad(high Dose 65+) 11/16/2018, 12/20/2019  . Influenza Split 01/11/2016  . Influenza,inj,Quad PF,6+ Mos 12/01/2013, 01/18/2015  . Influenza-Unspecified 01/23/2018  . PFIZER SARS-COV-2 Vaccination 04/20/2019, 05/13/2019, 12/21/2019  . Pneumococcal Conjugate-13 01/18/2015  . Pneumococcal Polysaccharide-23 07/12/2010, 04/21/2017  . Td 11/16/2018  . Tdap 09/15/2008   Patient reports receiving both covid vaccines  SDOH Screenings    Alcohol Screen: Not on file  Depression (PHQ2-9): Medium Risk  . PHQ-2 Score: 8  Financial Resource Strain: Low Risk   . Difficulty of Paying Living Expenses: Not very hard  Food Insecurity: Not on file  Housing: Not on file  Physical Activity: Not on file  Social Connections: Not on file  Stress: Not on file  Tobacco Use: Low Risk   . Smoking Tobacco Use: Never Smoker  . Smokeless Tobacco Use: Never Used  Transportation Needs: Not on file    Current Diagnosis/Assessment:  Goals Addressed            This Visit's Progress   . Chronic Care Management Pharmacy Care Plan       CARE PLAN ENTRY (see longitudinal plan of care for additional care plan information)  Current Barriers:  . Chronic Disease Management support, education, and care coordination needs related to DM, HTN, HLD, CAD, Depression, GERD, Osteopenia, Obesity, Allergic Rhinitis, Pain   Hypertension BP Readings from Last 3 Encounters:  12/29/19 (!) 148/60  12/20/19 122/60  12/10/19 (!) 147/56   . Pharmacist Clinical Goal(s): o Over the next 90 days, patient will work with PharmD and providers to achieve BP goal <130/80 . Current regimen:   Telmisartan 40mg  daily  Metoprolol succinate 25mg  1/2 tab daily . Interventions: o Requested patient to check blood pressure 3-4 times per week and record . Patient self care activities - Over the next 90 days, patient will: o Check BP 3-4 times per week, document, and provide at future appointments o Ensure daily salt intake < 2300 mg/Senetra Dillin  Hyperlipidemia Lab Results  Component Value Date/Time   [facility]  12/20/2019 09:45 AM     Comment:     . LDL cholesterol not calculated. Triglyceride levels greater than 400 mg/dL invalidate calculated LDL results. . Reference range: <100 . Desirable range <100 mg/dL for primary prevention;   <70 mg/dL for patients with CHD or diabetic patients  with > or = 2 CHD risk factors. [facility] LDL-C is now calculated using the  Martin-Hopkins  calculation, which is a validated novel method providing  better accuracy than the Friedewald equation in the  estimation of LDL-C.  12/22/2019 et al. Marland Kitchen. Horald Pollen): 2061-2068  (http://education.[facility].com/faq/FAQ164)    LDLDIRECT 82.0 11/16/2018 10:10 AM   . Pharmacist Clinical Goal(s): o Over the next 90 days, patient will work with PharmD and providers to achieve LDL goal < 70 . Current regimen:   Simvastatin 40mg  daily (changed back from crestor on 02/24/20 per Cardio due to patient having muscle pain with crestor)  Vascepa 1g #1 BID . Interventions: o Encouraged adherence to statin therapy . Patient self care activities - Over the next 90 days, patient will: o Maintain cholesterol medication regimen. o Keep appt to get lipids rechecked   Diabetes Lab Results  Component Value Date/Time   HGBA1C 7.0 (H) 12/20/2019 09:45 AM   HGBA1C 6.8 (H) 07/01/2019 09:21 AM   . Pharmacist Clinical Goal(s): o Over the next 90 days, patient will work with PharmD  and providers to achieve A1c goal <7% . Current regimen:  o Metformin ER 500mg  daily . Interventions: o Discussed importance of diet and exercise . Patient self care activities - Over the next 90 days, patient will: o Check blood sugar once daily, document, and provide at future appointments o Contact provider with any episodes of hypoglycemia  Medication management . Pharmacist Clinical Goal(s): o Over the next 180 days, patient will work with PharmD and providers to achieve optimal medication adherence . Current pharmacy: Outpatient Pharmacy . Interventions o Comprehensive medication review performed. o Continue current medication management strategy . Patient self care activities - Over the next 180 days, patient will: o Focus on medication adherence by filling and taking medications appropriately  o Take medications as prescribed o Report any questions or concerns to PharmD and/or  provider(s)  Please see past updates related to this goal by clicking on the "Past Updates" button in the selected goal        Social Hx: 2 Children (oldest - son), 2nd is daughter. Has a 15 year old grandaughter, 37 year old 25. 2 great grand daughters   She is really stressed with her husband's health. He had a perforation and heart issues. Has had multiple appts and she is working to manage his medical care more than hers.  Update 12/30/19 Going to the [place] with 4 other couples   Acute:  R should sore and she feels she has a cyst on her shoulder. She feels she has pulled muscles. She plans to call the office to see what next steps should be.  Diabetes   Goal < 7%  Recent Relevant Labs: Lab Results  Component Value Date/Time   HGBA1C 7.0 (H) 12/20/2019 09:45 AM   HGBA1C 6.8 (H) 07/01/2019 09:21 AM   MICROALBUR 0.8 11/16/2018 10:10 AM   MICROALBUR 0.2 09/29/2014 09:10 AM    Checking BG: Weekly   113 lowest  168 highest (after strawberry cake with birthday celebrations) Eating more veggies and less pasta  Went on a vacation with another couple.  Patient is currently borderline controlled and improved on the following medications:   Metformin ER 500mg  daily  Denies any symptoms of hypoglycemia. Thinks any symptoms she has faced were due more to anxiety/stress with her husband's health. Congratulated patient on lowered a1c  Last diabetic Eye exam:  Lab Results  Component Value Date/Time   HMDIABEYEEXA No Retinopathy 07/27/2015 09:06 AM  Diabetic Eye Exam 03/01/19 at [facility] = No diabetic retinopathy OU  Last diabetic Foot exam: No results found for: HMDIABFOOTEX   We discussed: diet and exercise extensively   Update 12/30/19 A1c trending up.  Any new changes? Stress with COVID related deaths. States her FBG are running in 120s  Update 03/31/20 Reports BG in 130s None less than 90  Plan -Check blood sugar  3-4 times per week and record readings -Continue current medications    Hypertension   Blood Pressure Goal <130/80  Office blood pressures are  BP Readings from Last 3 Encounters:  12/29/19 (!) 148/60  12/20/19 122/60  12/10/19 (!) 147/56   Patient has failed these meds in the past: hctz  Patient is currently uncontrolled, but may be trending up due to stress on the following medications:  Telmisartan 40mg  daily  Metoprolol succinate 25mg  1/2 tab daily  Patient checks BP at home when feeling symptomatic  Patient home BP readings are ranging: Unable to assess  Update 08/26/19 Checked  BP this morning and it was 160 something over 70. States she had her machine calibrated with Dr. Terrial Rhodes machine and it was accurate, so she trusts the machine.  Denies chest pain or dizziness. Does report head aches. She states she has had a tightening in her chest, but feels it is related to anxiety because she can talk herself down and be fine.   We discussed Proper blood pressure measurement technique  Update 12/30/19 Reports BP of 130-140/60 States she threw away specific readings  Update 03/31/20 Get refill of telmisartan? Yes Has not been checking  Plan -Continue checking blood pressure 3-4 times per week -Continue current medications    Hyperlipidemia   LDL Goal <70 (82 to 53 to unable to calculate due to high TRIG) TG Goal <150 (269 to 154 to 511) HDL Goal >50 (32.9 to 31.9 to 33)  Lipid Panel     Component Value Date/Time   CHOL 209 (H) 12/20/2019 0945   TRIG 511 (H) 12/20/2019 0945   HDL 33 (L) 12/20/2019 0945   CHOLHDL 6.3 (H) 12/20/2019 0945   VLDL 30.8 07/01/2019 0921   LDLCALC  12/20/2019 0945     Comment:     . LDL cholesterol not calculated. Triglyceride levels greater than 400 mg/dL invalidate calculated LDL results. . Reference range: <100 . Desirable range <100 mg/dL for primary prevention;   <70 mg/dL for patients with CHD or diabetic patients  with > or  = 2 CHD risk factors. [facility] LDL-C is now calculated using the Martin-Hopkins  calculation, which is a validated novel method providing  better accuracy than the Friedewald equation in the  estimation of LDL-C.  Cresenciano Genre et al. Annamaria Helling. 8416;606(30): 2061-2068  (http://education.[facility].com/faq/FAQ164)    LDLDIRECT 82.0 11/16/2018 1010     ASCVD 10-year risk: Hx of ASCVD  Patient has failed these meds in past: None noted Patient is currently uncontrolled on the following medications:   Simvastatin 40mg  daily (changed back from crestor on 02/24/20 per Cardio due to patient having muscle pain with crestor)  Vascepa 1g #1 BID (report flatulence with #2 BID, ok with #1BID per cardio)  States she will start taking rosuvastatin tonight.  She is grateful to know her LAD is still patent.  Feels her most recent panel was elevated due to not fasting. Admits to COVID related deaths of people in her home town. This has been worrisome for her. Hasn't had to reuse NTG since her incident while shopping  Update 03/31/20 Consider recheck lipid panel since switching back to simvastatin. States she will get this done in February Aching has resolved since restarting simvastatin  Plan -Consider recheck of lipid panel -Continue current medications   Obesity    Patient has failed these meds in past: None noted Patient is currently stable on the following medications: None  Has maintained 10lb weight loss.  Congratulated patient on maintaining progress in the setting of stress.   We discussed:  Importance of maintaining goals in the midst of stress   Update 12/30/19 Avoiding fried foods. Doing Optivia protein bars and shakes.  States she has lost a few pounds since last visit. Now at 204lbs.  Feels her 2xl clothes are feeling bigger.  Has not exercised as much.  Was recommended to slowly increase her exercise as tolerated.  Update 03/31/20 Now at 205lbs. Starting weight 228lbs a year and  a half ago.   Plan -Continue control with diet and exercise  Melvenia Beam Simon Llamas, PharmD, [facility] Clinical Pharmacist [facility] [phone]

## 2020-04-01 NOTE — Patient Instructions (Addendum)
It was great to see you again today - I am sorry your back is hurting you!  Please go to the imaging dept on the ground floor and get films of your ribs and chest I would recommend using the vicodin you have on hand during the day, and perhaps the valium at night  Please let me know if you are not improving over the next 2-3 days- Sooner if worse.

## 2020-04-01 NOTE — Patient Instructions (Signed)
Visit Information  Goals Addressed            This Visit's Progress   . Chronic Care Management Pharmacy Care Plan       CARE PLAN ENTRY (see longitudinal plan of care for additional care plan information)  Current Barriers:  . Chronic Disease Management support, education, and care coordination needs related to DM, HTN, HLD, CAD, Depression, GERD, Osteopenia, Obesity, Allergic Rhinitis, Pain   Hypertension BP Readings from Last 3 Encounters:  12/29/19 (!) 148/60  12/20/19 122/60  12/10/19 (!) 147/56   . Pharmacist Clinical Goal(s): o Over the next 90 days, patient will work with PharmD and providers to achieve BP goal <130/80 . Current regimen:   Telmisartan 40mg  daily  Metoprolol succinate 25mg  1/2 tab daily . Interventions: o Requested patient to check blood pressure 3-4 times per week and record . Patient self care activities - Over the next 90 days, patient will: o Check BP 3-4 times per week, document, and provide at future appointments o Ensure daily salt intake < 2300 mg/Earley Grobe  Hyperlipidemia Lab Results  Component Value Date/Time   [facility]  12/20/2019 09:45 AM     Comment:     . LDL cholesterol not calculated. Triglyceride levels greater than 400 mg/dL invalidate calculated LDL results. . Reference range: <100 . Desirable range <100 mg/dL for primary prevention;   <70 mg/dL for patients with CHD or diabetic patients  with > or = 2 CHD risk factors. [facility] LDL-C is now calculated using the Martin-Hopkins  calculation, which is a validated novel method providing  better accuracy than the Friedewald equation in the  estimation of LDL-C.  12/22/2019 et al. Marland Kitchen. Horald Pollen): 2061-2068  (http://education.[facility].com/faq/FAQ164)    LDLDIRECT 82.0 11/16/2018 10:10 AM   . Pharmacist Clinical Goal(s): o Over the next 90 days, patient will work with PharmD and providers to achieve LDL goal < 70 . Current regimen:   Simvastatin 40mg  daily (changed back  from crestor on 02/24/20 per Cardio due to patient having muscle pain with crestor)  Vascepa 1g #1 BID . Interventions: o Encouraged adherence to statin therapy . Patient self care activities - Over the next 90 days, patient will: o Maintain cholesterol medication regimen. o Keep appt to get lipids rechecked   Diabetes Lab Results  Component Value Date/Time   HGBA1C 7.0 (H) 12/20/2019 09:45 AM   HGBA1C 6.8 (H) 07/01/2019 09:21 AM   . Pharmacist Clinical Goal(s): o Over the next 90 days, patient will work with PharmD and providers to achieve A1c goal <7% . Current regimen:  o Metformin ER 500mg  daily . Interventions: o Discussed importance of diet and exercise . Patient self care activities - Over the next 90 days, patient will: o Check blood sugar once daily, document, and provide at future appointments o Contact provider with any episodes of hypoglycemia  Medication management . Pharmacist Clinical Goal(s): o Over the next 180 days, patient will work with PharmD and providers to achieve optimal medication adherence . Current pharmacy: [facility] . Interventions o Comprehensive medication review performed. o Continue current medication management strategy . Patient self care activities - Over the next 180 days, patient will: o Focus on medication adherence by filling and taking medications appropriately  o Take medications as prescribed o Report any questions or concerns to PharmD and/or provider(s)  Please see past updates related to this goal by clicking on the "Past Updates" button in the selected goal  The patient verbalized understanding of instructions, educational materials, and care plan provided today and agreed to receive a mailed copy of patient instructions, educational materials, and care plan.   Telephone follow up appointment with pharmacy team member scheduled for: 09/28/2020  Dannielle Karvonen Nazir Hacker, [facility]

## 2020-04-01 NOTE — Progress Notes (Signed)
[facility] [address], [address] [place], [place] [address] [phone]  Date:  04/03/2020   Name:  [name]   DOB:  [birth year]   MRN:  [id number]  PCP:  Pearline Cables, MD    Chief Complaint: Back Pain (Upper mid back radiating to side)   History of Present Illness:  [name] is a 70 y.o. very pleasant female patient who presents with the following:  Here today with concern of back pain Last seen by myself 9/27-  history of hypertension, CAD, diabetes, sleep apnea, obesity, osteopenia with elevated fracture risk on fosamax  Cardiologist is Dr Rennis Golden   covid booster done Flu vaccine done Shingrix:   Lab Results  Component Value Date   HGBA1C 7.0 (H) 12/20/2019   Pt notes she is having mid thoracic back pain running around the right side of her bra line-the pain seems worse in a very specific spot in her mid, right-sided thoracic back No cough, no fever noted although she has felt warm  She is not aware of any injury She has noted this since Thursday- today is Monday The pain can be severe, it will come and go She tried some robaxin that she had on hand- helped a little bit She also tried some heat, tylenol and aleve on occasion only (plavix)  She did feel a bit SOB the first day this occurred, and her BP was up 179/71 No SOB today   She could not get comfortable in bed last night  She does have some diazepam on hand but did not try taking it yet   Patient Active Problem List   Diagnosis Date Noted   Obstructive sleep apnea treated with continuous positive airway pressure (CPAP) 05/07/2018   Chest pain 01/31/2018   Morbid obesity (HCC) 11/03/2017   Coronary artery disease involving native coronary artery of native heart with unstable angina pectoris (HCC) 11/03/2017   Insomnia 11/03/2017   Inadequate sleep hygiene 11/03/2017   Anxiety 07/21/2017   Dizziness 10/02/2016   Right patella  fracture 08/29/2016   Acquired foot deformity, left 07/18/2016   Osteopenia 11/10/2015   HNP (herniated nucleus pulposus), lumbar 10/12/2014   Spinal stenosis at L4-L5 level 10/12/2014   Iron deficiency anemia 07/29/2014   DM (diabetes mellitus) with complications (HCC) 07/29/2014   Environmental and seasonal allergies 04/28/2014   Spinal stenosis, lumbar region, with neurogenic claudication 01/06/2013   Obesity, morbid, BMI 40.0-49.9 (HCC) 10/20/2012   Chest pain with moderate risk of acute coronary syndrome 10/02/2012   CAD S/P percutaneous coronary angioplasty    Hyperlipidemia with target LDL less than 70    Essential hypertension 04/16/2012    Past Medical History:  Diagnosis Date   Anemia    Arthritis    Bronchitis    hx of    CAD (coronary artery disease)    a. s/p DES to LAD in 2010 with patent stent by cath in 2014 b. low-risk NST in 11/2016   Cataracts, bilateral    Diabetes mellitus without complication (HCC)    Family history of adverse reaction to anesthesia    pts mother had difficulty awakening    GERD (gastroesophageal reflux disease)    Hyperlipidemia LDL goal < 70    Hypertension    Lumbar back pain    Numbness    left leg and foot    Plantar fascia rupture    Left Foot   Sleep  apnea    uses CPAP     Past Surgical History:  Procedure Laterality Date   ABDOMINAL HYSTERECTOMY     BACK SURGERY     BREAST CYST ASPIRATION  1995   CAROTID STENT  2009   pt denies    CORONARY ANGIOPLASTY WITH STENT PLACEMENT  2010and 10-02-2012   Stent DES, Xience to prox. LAD   DOPPLER ECHOCARDIOGRAPHY  08/01/2009   EF=>55%,LV normal   LEFT HEART CATH AND CORONARY ANGIOGRAPHY N/A 12/10/2019   Procedure: LEFT HEART CATH AND CORONARY ANGIOGRAPHY;  Surgeon: Lyn RecordsSmith, Henry W, MD;  Location: [facility]AS[facility];  Service: Cardiovascular;  Laterality: N/A;   LEFT HEART CATHETERIZATION WITH CORONARY ANGIOGRAM N/A 10/02/2012   Procedure: LEFT HEART  CATHETERIZATION WITH CORONARY ANGIOGRAM;  Surgeon: Runell GessJonathan J Berry, MD;  [facility];  Service: Cardiovascular;  Laterality: N/A;   lower arterial duplex  06/20/10   abi's normal,rgt 0.98,lft 1.06;bilateral PVRs normal   LUMBAR LAMINECTOMY/DECOMPRESSION MICRODISCECTOMY Left 01/06/2013   Procedure: MICRO LUMBAR DECOMPRESSION L4-5 AND L5-S1;  Surgeon: Javier DockerJeffrey C Beane, MD;  Location: [facility] OR[facility];  Service: Orthopedics;  Laterality: Left;   LUMBAR LAMINECTOMY/DECOMPRESSION MICRODISCECTOMY Left 10/12/2014   Procedure: REVISION MICRO LUMBAR/DECOMPRESSION L4-5 LEFT ;  Surgeon: Jene EveryJeffrey Beane, MD;  Location: [facility] OR[facility];  Service: Orthopedics;  Laterality: Left;   NM MYOCAR PERF WALL MOTION  09/22/2008   lexiscan-EF 83%; glogal LV systolic fx is norm. ,evidence of mild ischemia basal anterior,midanterior and apical lateral region(s).    ORIF PATELLA Right 08/29/2016   Procedure: OPEN REDUCTION INTERNAL (ORIF) FIXATION RIGHT PATELLA;  Surgeon: Samson FredericSwinteck, Brian, MD;  Location: [facility] OR[facility];  Service: Orthopedics;  Laterality: Right;  Adductor Block   TUBAL LIGATION     TYMPANOPLASTY Bilateral    UVULOPALATOPHARYNGOPLASTY     pt denies     Social History   Tobacco Use   Smoking status: Never Smoker   Smokeless tobacco: Never Used  Vaping Use   Vaping Use: Never used  Substance Use Topics   Alcohol use: Yes    Comment: occasional, 1 a month wine   Drug use: No    Family History  Problem Relation Age of Onset   Coronary artery disease Mother    Rheum arthritis Mother    Dementia Mother    Heart attack Father    Hypertension Father    Hyperlipidemia Father    Other Father        MVA   Hypertension Brother    Cancer Brother    Heart disease Brother    Cancer Paternal Grandmother        stomach   Diabetes Paternal Grandfather    Breast cancer Neg Hx     Allergies  Allergen Reactions   Niacin And Related Other (See Comments)    Whelps and skin flushed, mouth  tingling    Medication list has been reviewed and updated.  Current Outpatient Medications on File Prior to Visit  Medication Sig Dispense Refill   acetaminophen (TYLENOL) 500 MG tablet Take 500 mg by mouth every 6 (six) hours as needed for mild pain or headache.     alendronate (FOSAMAX) 70 MG tablet Take 1 tablet (70 mg total) by mouth every 7 (seven) days. Take with a full glass of water on an empty stomach. (Patient taking differently: Take 70 mg by mouth every Sunday. Take with a full glass of water on an empty stomach.) 12 tablet 3   aspirin EC 81 MG EC tablet  Take 1 tablet (81 mg total) by mouth daily. 30 tablet 1   calcium carbonate (OS-CAL) 1250 (500 Ca) MG chewable tablet Chew 1 tablet by mouth daily.     cetirizine (ZYRTEC) 10 MG chewable tablet Chew 10 mg by mouth daily as needed for allergies.     cholecalciferol (VITAMIN D) 1000 UNITS tablet Take 1,000 Units by mouth daily.     clopidogrel (PLAVIX) 75 MG tablet TAKE 1 TABLET BY MOUTH DAILY. (Patient taking differently: Take 75 mg by mouth daily.) 90 tablet 3   cyclobenzaprine (FLEXERIL) 10 MG tablet TAKE 1 TABLET BY MOUTH TWICE DAILY AS NEEDED FOR MUSCLE SPASMS (Patient taking differently: Take 10 mg by mouth daily as needed for muscle spasms (Back Spasms).) 30 tablet 2   diazepam (VALIUM) 2 MG tablet TAKE 1 TABLET BY MOUTH EVERY 8 HOURS AS NEEDED FOR MUSCLE SPASMS (Patient taking differently: Take 2 mg by mouth every 8 (eight) hours as needed for muscle spasms.) 30 tablet 0   diclofenac sodium (VOLTAREN) 1 % GEL Apply 2 g topically 4 (four) times daily. (Patient taking differently: Apply 2 g topically 3 (three) times daily.)     famotidine (PEPCID) 20 MG tablet TAKE 1 TABLET BY MOUTH 2 TIMES DAILY. (Patient taking differently: Take 20 mg by mouth daily as needed for heartburn.) 180 tablet 1   FLUoxetine (PROZAC) 20 MG capsule TAKE 1 CAPSULE BY MOUTH ONCE DAILY (Patient taking differently: Take 20 mg by mouth daily.) 90  capsule 3   fluticasone (FLONASE) 50 MCG/ACT nasal spray Place 1 spray into both nostrils daily as needed for allergies or rhinitis.     metFORMIN (GLUCOPHAGE-XR) 500 MG 24 hr tablet Take 1 tablet (500 mg total) by mouth daily with breakfast. 30 tablet 0   methocarbamol (ROBAXIN) 500 MG tablet TAKE 1 TABLET (500 MG TOTAL) BY MOUTH EVERY 8 (EIGHT) HOURS AS NEEDED FOR MUSCLE SPASMS. 40 tablet 0   metoprolol succinate (TOPROL-XL) 25 MG 24 hr tablet TAKE 1/2 OF A TABLET BY MOUTH DAILY. 45 tablet 3   Multiple Vitamin (MULTIVITAMIN WITH MINERALS) TABS tablet Take 1 tablet by mouth daily.     nitroGLYCERIN (NITROSTAT) 0.4 MG SL tablet Place 1 tablet (0.4 mg total) under the tongue every 5 (five) minutes as needed for chest pain. 25 tablet 2   simvastatin (ZOCOR) 40 MG tablet Take 40 mg by mouth daily.     telmisartan (MICARDIS) 40 MG tablet TAKE 1 TABLET BY MOUTH EVERY MORNING 30 tablet 11   VASCEPA 1 g capsule TAKE 2 CAPSULES BY MOUTH TWICE DAILY 120 capsule 11   Current Facility-Administered Medications on File Prior to Visit  Medication Dose Route Frequency Provider Last Rate Last Admin   sodium chloride flush (NS) 0.9 % injection 3 mL  3 mL Intravenous Q12H Ronney Asters, NP        Review of Systems:  As per HPI- otherwise negative.   Physical Examination: Vitals:   04/03/20 0849  BP: 130/78  Pulse: (!) 59  Resp: 17  SpO2: 98%   Vitals:   04/03/20 0849  Weight: 205 lb (93 kg)  Height: 5\' 1"  (1.549 m)   Body mass index is 38.73 kg/m. Ideal Body Weight: Weight in (lb) to have BMI = 25: 132  GEN: no acute distress.  Obese, looks well and her normal self HEENT: Atraumatic, Normocephalic.  Ears and Nose: No external deformity. CV: RRR, No M/G/R. No JVD. No thrill. No extra heart sounds. PULM: CTA B,  no wheezes, crackles, rhonchi. No retractions. No resp. distress. No accessory muscle use. EXTR: No c/c/e PSYCH: Normally interactive. Conversant.  The patient has a  discrete spot of tenderness lateral to the thoracic spine right at the bra line, right-hand side.  Check skin carefully, there is no evidence of zoster No midline bony tenderness Normal bilateral upper extremity strength, sensation Normal thoracic or lumbar flexion and extension   Assessment and Plan: Acute right-sided thoracic back pain - Plan: DG Ribs Unilateral Right, DG Chest 2 View  Visit today with apparently musculoskeletal pain, easily reproducible with pressure on her right posterior ribs. Plan to obtain rib and chest films today to rule out any other pathology Patient notes she actually has hydrocodone and also Valium at home that she can use if needed I suggested that she try hydrocodone during the day, and Valium at night as needed  We will be in touch with imaging reports, have asked her to let me know if not getting better in the next 2 to 3 days, sooner if worse.  Continue to monitor for any sign of varicella-zoster rash This visit occurred during the SARS-CoV-2 public health emergency.  Safety protocols were in place, including screening questions prior to the visit, additional usage of staff PPE, and extensive cleaning of exam room while observing appropriate contact time as indicated for disinfecting solutions.    Signed Abbe Amsterdam, MD   Received her imaging reports as below, message to patient  DG Chest 2 View  Result Date: 04/03/2020 CLINICAL DATA:  Right rib pain without known injury. EXAM: CHEST - 2 VIEW COMPARISON:  November 18, 2019. FINDINGS: The heart size and mediastinal contours are within normal limits. Both lungs are clear. No pneumothorax or pleural effusion is noted. The visualized skeletal structures are unremarkable. IMPRESSION: No active cardiopulmonary disease. Electronically Signed   By: Lupita Raider M.D.   On: 04/03/2020 09:34   DG Ribs Unilateral Right  Result Date: 04/03/2020 CLINICAL DATA:  Right rib pain without known injury. EXAM: RIGHT  RIBS - 2 VIEW COMPARISON:  None. FINDINGS: No fracture or other bone lesions are seen involving the ribs. IMPRESSION: Negative. Electronically Signed   By: Lupita Raider M.D.   On: 04/03/2020 09:33

## 2020-04-03 ENCOUNTER — Other Ambulatory Visit: Payer: Self-pay

## 2020-04-03 ENCOUNTER — Ambulatory Visit (HOSPITAL_BASED_OUTPATIENT_CLINIC_OR_DEPARTMENT_OTHER)
Admission: RE | Admit: 2020-04-03 | Discharge: 2020-04-03 | Disposition: A | Discharge status: Home / Self Care | Payer: PPO | Source: Ambulatory Visit | Attending: Family Medicine | Admitting: Family Medicine

## 2020-04-03 ENCOUNTER — Encounter: Payer: Self-pay | Admitting: Family Medicine

## 2020-04-03 ENCOUNTER — Ambulatory Visit (INDEPENDENT_AMBULATORY_CARE_PROVIDER_SITE_OTHER): Payer: PPO | Admitting: Family Medicine

## 2020-04-03 VITALS — BP 130/78 | HR 59 | Resp 17 | Ht 61.0 in | Wt 205.0 lb

## 2020-04-03 DIAGNOSIS — M546 Pain in thoracic spine: Secondary | ICD-10-CM

## 2020-04-03 DIAGNOSIS — R0781 Pleurodynia: Secondary | ICD-10-CM | POA: Diagnosis not present

## 2020-04-04 ENCOUNTER — Other Ambulatory Visit: Payer: Self-pay | Admitting: Family Medicine

## 2020-04-04 ENCOUNTER — Encounter: Payer: Self-pay | Admitting: Family Medicine

## 2020-04-04 ENCOUNTER — Other Ambulatory Visit: Payer: Self-pay

## 2020-04-04 ENCOUNTER — Ambulatory Visit (INDEPENDENT_AMBULATORY_CARE_PROVIDER_SITE_OTHER): Payer: PPO | Admitting: Family Medicine

## 2020-04-04 VITALS — BP 138/82 | HR 61 | Temp 97.6°F | Ht 61.5 in | Wt 207.2 lb

## 2020-04-04 DIAGNOSIS — R1011 Right upper quadrant pain: Secondary | ICD-10-CM

## 2020-04-04 DIAGNOSIS — M546 Pain in thoracic spine: Secondary | ICD-10-CM | POA: Diagnosis not present

## 2020-04-04 MED ORDER — ONDANSETRON 4 MG PO TBDP: 4.0000 mg | ORAL_TABLET | Freq: Three times a day (TID) | ORAL | 0 refills | Status: DC | PRN

## 2020-04-04 MED ORDER — METHOCARBAMOL 500 MG PO TABS: 500.0000 mg | ORAL_TABLET | Freq: Three times a day (TID) | ORAL | 0 refills | Status: DC | PRN

## 2020-04-04 MED FILL — ONDANSETRON ODT 4 MG TABLET: 4 | 7 days supply | Qty: 20 | Fill #0

## 2020-04-04 MED FILL — METHOCARBAMOL 500 MG TABLET: 500 | 10 days supply | Qty: 30 | Fill #0

## 2020-04-04 NOTE — Progress Notes (Signed)
Musculoskeletal Exam  Patient: [name] DOB: Jul 27, 1950  DOS: 04/04/2020  SUBJECTIVE:  Chief Complaint:   Chief Complaint  Patient presents with  . Flank Pain    Right side     [name] is a 70 y.o.  female for evaluation and treatment of R side pain.   Onset:  5 days ago. Pulled on a handle trying to get out of bed  Location: R side, lower rib cage starting on the R posterior radiating to the front Character:  dull and sharp  Progression of issue:  has worsened Associated symptoms: nausea, swelling No vomiting, bowel changes, redness, bruising Treatment: to date has been muscle relaxers and blue emu, Voltaren gel, Aspercreme/lidocaine, Tylenol.   Neurovascular symptoms: no  Past Medical History:  Diagnosis Date  . Anemia   . Arthritis   . Bronchitis    hx of   . CAD (coronary artery disease)    a. s/p DES to LAD in 2010 with patent stent by cath in 2014 b. low-risk NST in 11/2016  . Cataracts, bilateral   . Diabetes mellitus without complication (HCC)   . Family history of adverse reaction to anesthesia    pts mother had difficulty awakening   . GERD (gastroesophageal reflux disease)   . Hyperlipidemia LDL goal < 70   . Hypertension   . Lumbar back pain   . Numbness    left leg and foot   . Plantar fascia rupture    Left Foot  . Sleep apnea    uses CPAP     Objective: VITAL SIGNS: BP 138/82 (BP Location: Left Arm, Patient Position: Sitting, Cuff Size: Normal)   Pulse 61   Temp 97.6 F (36.4 C) (Oral)   Ht 5' 1.5" (1.562 m)   Wt 207 lb 4 oz (94 kg)   LMP  (LMP Unknown)   SpO2 96%   BMI 38.53 kg/m  Constitutional: Well formed, well developed. No acute distress. Thorax & Lungs: No accessory muscle use Musculoskeletal: Right back/side.   Tenderness to palpation: Yes over the thoracic paraspinal and proximal erector spinae musculature with radiation along the side of the rib Deformity: no Ecchymosis: no Abdomen: Bowel sounds present, soft, tender  to palpation epigastric and right upper quadrant area, nondistended, +Murphy's. Negative McBurney's, Rovsing's, Carnett's Neurologic: Normal sensory function.  Psychiatric: Normal mood. Age appropriate judgment and insight. Alert & oriented x 3.    Assessment:  Acute right-sided thoracic back pain - Plan: DISCONTINUED: methocarbamol (ROBAXIN) 500 MG tablet  Right upper quadrant abdominal pain - Plan: US ABDOMEN LIMITED RUQ (LIVER/GB), DISCONTINUED: ondansetron (ZOFRAN-ODT) 4 MG disintegrating tablet  Plan: 1. Cont Robaxin. Stretches/exercises, heat, ice, Tylenol. Avoid NSAIDs in her given cardiac hx.  2. Ck RUQ [place].  F/u as originally scheduled w Dr. Patsy Lager. The patient voiced understanding and agreement to the plan.   Jilda Roche [place], DO 04/04/20  3:28 PM

## 2020-04-04 NOTE — Patient Instructions (Addendum)
Heat (pad or rice pillow in microwave) over affected area, 10-15 minutes twice daily.   Ice/cold pack over area for 10-15 min twice daily.  OK to take Tylenol 1000 mg (2 extra strength tabs) or 975 mg (3 regular strength tabs) every 6 hours as needed.  Take Flexeril (cyclobenzaprine) 1-2 hours before planned bedtime. If it makes you drowsy, do not take during the day. You can try half a tab the following night.  Let us know if you need anything.   Mid-Back Strain Rehab It is normal to feel mild stretching, pulling, tightness, or discomfort as you do these exercises, but you should stop right away if you feel sudden pain or your pain gets worse.   Stretching and range of motion exercises This exercise warms up your muscles and joints and improves the movement and flexibility of your back and shoulders. This exercise also help to relieve pain. Exercise A: Chest and spine stretch    1. Lie down on your back on a firm surface. 2. Roll a towel or a small blanket so it is about 4 inches (10 cm) in diameter. 3. Put the towel lengthwise under the middle of your back so it is under your spine, but not under your shoulder blades. 4. To increase the stretch, you may put your hands behind your head and let your elbows fall to your sides. 5. Hold for 30 seconds. Repeat exercise 2 times. Complete this exercise 3 times per week.  Strengthening exercises These exercises build strength and endurance in your back and your shoulder blade muscles. Endurance is the ability to use your muscles for a long time, even after they get tired. Exercise B: Alternating arm and leg raises    1. Get on your hands and knees on a firm surface. If you are on a hard floor, you may want to use padding to cushion your knees, such as an exercise mat. 2. Line up your arms and legs. Your hands should be below your shoulders, and your knees should be below your hips. 3. Lift your left leg behind you. At the same time, raise  your right arm and straighten it in front of you. ? Do not lift your leg higher than your hip. ? Do not lift your arm higher than your shoulder. ? Keep your abdominal and back muscles tight. ? Keep your hips facing the ground. ? Do not arch your back. ? Keep your balance carefully, and do not hold your breath. 4. Hold for 3 seconds. 5. Slowly return to the starting position and repeat with your right leg and your left arm. Repeat 2 times. Complete this exercise 3 times per week. Exercise C: Straight arm rows (shoulder extension)     1. Stand with your feet shoulder width apart. 2. Secure an exercise band to a stable object in front of you so the band is at or above shoulder height. 3. Hold one end of the exercise band in each hand. 4. Straighten your elbows and lift your hands up to shoulder height. 5. Step back, away from the secured end of the exercise band, until the band stretches. 6. Squeeze your shoulder blades together and pull your hands down to the sides of your thighs. Stop when your hands are straight down by your sides. Do not let your hands go behind your body. 7. Hold for 3 seconds. 8. Slowly return to the starting position. Repeat 2 times. Complete this exercise 3 times per week. Exercise D: Shoulder  external rotation, prone 1. Lie on your abdomen on a firm bed so your left / right forearm hangs over the edge of the bed and your upper arm is on the bed, straight out from your body. ? Your elbow should be bent. ? Your palm should be facing your feet. 2. If instructed, hold a 5 lb weight in your hand. 3. Squeeze your shoulder blade toward the middle of your back. Do not let your shoulder lift toward your ear. 4. Keep your elbow bent in an "L" shape (90 degrees) while you slowly move your forearm up toward the ceiling. Move your forearm up to the height of the bed, toward your head. ? Your upper arm should not move. ? At the top of the movement, your palm should face the  floor. 5. Hold for 3 seconds. 6. Slowly return to the starting position and relax your muscles. Repeat 3 times. Complete this exercise 3 times per week. Exercise E: Scapular retraction and external rotation, rowing    1. Sit in a stable chair without armrests, or stand. 2. Secure an exercise band to a stable object in front of you so it is at shoulder height. 3. Hold one end of the exercise band in each hand. 4. Bring your arms out straight in front of you. 5. Step back, away from the secured end of the exercise band, until the band stretches. 6. Pull the band backward. As you do this, bend your elbows and squeeze your shoulder blades together, but avoid letting the rest of your body move. Do not let your shoulders lift up toward your ears. 7. Stop when your elbows are at your sides or slightly behind your body. 8. Hold for 5 seconds. 9. Slowly straighten your arms to return to the starting position. Repeat 2 times. Complete this exercise 3 times per week. Posture and body mechanics    Body mechanics refers to the movements and positions of your body while you do your daily activities. Posture is part of body mechanics. Good posture and healthy body mechanics can help to relieve stress in your body's tissues and joints. Good posture means that your spine is in its natural S-curve position (your spine is neutral), your shoulders are pulled back slightly, and your head is not tipped forward. The following are general guidelines for applying improved posture and body mechanics to your everyday activities. Standing     When standing, keep your spine neutral and your feet about hip-width apart. Keep a slight bend in your knees. Your ears, shoulders, and hips should line up.  When you do a task in which you lean forward while standing in one place for a long time, place one foot up on a stable object that is 2-4 inches (5-10 cm) high, such as a footstool. This helps keep your spine  neutral. Sitting     When sitting, keep your spine neutral and keep your feet flat on the floor. Use a footrest, if necessary, and keep your thighs parallel to the floor. Avoid rounding your shoulders, and avoid tilting your head forward.  When working at a desk or a computer, keep your desk at a height where your hands are slightly lower than your elbows. Slide your chair under your desk so you are close enough to maintain good posture.  When working at a computer, place your monitor at a height where you are looking straight ahead and you do not have to tilt your head forward or downward to  look at the screen. Resting    When lying down and resting, avoid positions that are most painful for you.  If you have pain with activities such as sitting, bending, stooping, or squatting (flexion-based activities), lie in a position in which your body does not bend very much. For example, avoid curling up on your side with your arms and knees near your chest (fetal position).  If you have pain with activities such as standing for a long time or reaching with your arms (extension-based activities), lie with your spine in a neutral position and bend your knees slightly. Try the following positions:  Lying on your side with a pillow between your knees.  Lying on your back with a pillow under your knees.   Lifting     When lifting objects, keep your feet at least shoulder-width apart and tighten your abdominal muscles.  Bend your knees and hips and keep your spine neutral. It is important to lift using the strength of your legs, not your back. Do not lock your knees straight out.  Always ask for help to lift heavy or awkward objects. Make sure you discuss any questions you have with your health care provider.

## 2020-04-06 ENCOUNTER — Other Ambulatory Visit: Payer: Self-pay

## 2020-04-06 ENCOUNTER — Ambulatory Visit (HOSPITAL_BASED_OUTPATIENT_CLINIC_OR_DEPARTMENT_OTHER)
Admission: RE | Admit: 2020-04-06 | Discharge: 2020-04-06 | Disposition: A | Discharge status: Home / Self Care | Payer: PPO | Source: Ambulatory Visit | Attending: Family Medicine | Admitting: Family Medicine

## 2020-04-06 DIAGNOSIS — R1011 Right upper quadrant pain: Secondary | ICD-10-CM

## 2020-04-06 DIAGNOSIS — R945 Abnormal results of liver function studies: Secondary | ICD-10-CM | POA: Diagnosis not present

## 2020-04-06 DIAGNOSIS — K760 Fatty (change of) liver, not elsewhere classified: Secondary | ICD-10-CM | POA: Diagnosis not present

## 2020-04-07 ENCOUNTER — Ambulatory Visit
Admission: RE | Admit: 2020-04-07 | Discharge: 2020-04-07 | Disposition: A | Discharge status: Home / Self Care | Payer: PPO | Source: Ambulatory Visit | Attending: Family Medicine | Admitting: Family Medicine

## 2020-04-07 DIAGNOSIS — Z1231 Encounter for screening mammogram for malignant neoplasm of breast: Secondary | ICD-10-CM

## 2020-04-12 DIAGNOSIS — G4733 Obstructive sleep apnea (adult) (pediatric): Secondary | ICD-10-CM | POA: Diagnosis not present

## 2020-04-17 ENCOUNTER — Other Ambulatory Visit: Payer: Self-pay | Admitting: Internal Medicine

## 2020-04-17 MED FILL — CLOPIDOGREL 75 MG TABLET: 75 | 90 days supply | Qty: 90 | Fill #0

## 2020-05-01 ENCOUNTER — Other Ambulatory Visit: Payer: Self-pay | Admitting: Family Medicine

## 2020-05-01 DIAGNOSIS — E119 Type 2 diabetes mellitus without complications: Secondary | ICD-10-CM

## 2020-05-01 MED FILL — metFORMIN HCL ER 500 MG TB2: 500 | 90 days supply | Qty: 90 | Fill #0

## 2020-05-09 ENCOUNTER — Other Ambulatory Visit: Payer: Self-pay | Admitting: Family Medicine

## 2020-05-09 MED FILL — ALENDRONATE NA 70 MG TAB: 70 | 84 days supply | Qty: 12 | Fill #3

## 2020-05-09 MED FILL — SIMVASTATIN 40 MG TABLET: 40 | 90 days supply | Qty: 90 | Fill #0

## 2020-05-17 ENCOUNTER — Other Ambulatory Visit: Payer: Self-pay

## 2020-05-17 ENCOUNTER — Other Ambulatory Visit: Payer: Self-pay | Admitting: Internal Medicine

## 2020-05-17 ENCOUNTER — Encounter: Payer: Self-pay | Admitting: Internal Medicine

## 2020-05-17 ENCOUNTER — Ambulatory Visit: Payer: PPO | Admitting: Internal Medicine

## 2020-05-17 VITALS — BP 136/52 | Ht 62.0 in | Wt 206.0 lb

## 2020-05-17 DIAGNOSIS — I2583 Coronary atherosclerosis due to lipid rich plaque: Secondary | ICD-10-CM | POA: Diagnosis not present

## 2020-05-17 DIAGNOSIS — G4733 Obstructive sleep apnea (adult) (pediatric): Secondary | ICD-10-CM

## 2020-05-17 DIAGNOSIS — M791 Myalgia, unspecified site: Secondary | ICD-10-CM | POA: Diagnosis not present

## 2020-05-17 DIAGNOSIS — I2510 Atherosclerotic heart disease of native coronary artery without angina pectoris: Secondary | ICD-10-CM | POA: Diagnosis not present

## 2020-05-17 DIAGNOSIS — Z9989 Dependence on other enabling machines and devices: Secondary | ICD-10-CM | POA: Diagnosis not present

## 2020-05-17 DIAGNOSIS — T466X5A Adverse effect of antihyperlipidemic and antiarteriosclerotic drugs, initial encounter: Secondary | ICD-10-CM

## 2020-05-17 DIAGNOSIS — E785 Hyperlipidemia, unspecified: Secondary | ICD-10-CM | POA: Diagnosis not present

## 2020-05-17 LAB — HEPATIC FUNCTION PANEL
ALT: 14 IU/L (ref 0–32)
AST: 12 IU/L (ref 0–40)
Albumin: 4.2 g/dL (ref 3.8–4.8)
Alkaline Phosphatase: 109 IU/L (ref 44–121)
Bilirubin Total: 0.3 mg/dL (ref 0.0–1.2)
Bilirubin, Direct: <0.1 mg/dL (ref 0.00–0.40)
Total Protein: 6.6 g/dL (ref 6.0–8.5)

## 2020-05-17 LAB — LIPID PANEL
Chol/HDL Ratio: 4.6 ratio — ABNORMAL HIGH (ref 0.0–4.4)
Cholesterol, Total: 161 mg/dL (ref 100–199)
HDL: 35 mg/dL — ABNORMAL LOW (ref >39)
LDL Chol Calc (NIH): 92 mg/dL (ref 0–99)
Triglycerides: 201 mg/dL — ABNORMAL HIGH (ref 0–149)
VLDL Cholesterol Cal: 34 mg/dL (ref 5–40)

## 2020-05-17 MED ORDER — EZETIMIBE 10 MG PO TABS: 10.0000 mg | ORAL_TABLET | Freq: Every day | ORAL | 3 refills | Status: DC

## 2020-05-17 MED FILL — EZETIMIBE 10 MG TABS: 10 | 90 days supply | Qty: 90 | Fill #0

## 2020-05-17 NOTE — Progress Notes (Signed)
Date:  05/17/2020   ID:  [name], DOB [birth year], MRN [id number]  PCP:  [name] Cables, MD  Primary Cardiologist:  [name]  CC:  Follow-up chest pain   History of Present Illness: [name] is a 70 y.o. female with a history of CAD with DES stent to prox. LAD in 2010 with residual disease in LCX 10-20% and RCA 20% lesion.  She also has HTN, DM, dyslipidemia and obesity.  She presented on 10/01/12 to the ED with c/o chest pressure. Discomfort began at work and associated with flushing symptoms secondary to niacin. No DOE, SOB, nausea or diaphoresis. She then developed chest pressure and nausea and went to Urgent care. She thought the flushing was secondary to stopping ASA. She was given ASA, cooled down and then had chest presssure. Sent to [facility] ER and NTG SL did resolve the pressure. She was very concerned about the pressure because it was the same that she had with her Lexiscan in 2010 that lead to her stent.  Coronary angiogram revealed widely patent proximal LAD stent with otherwise normal coronary arteries and normal LV function.  [name] recently underwent back surgery and is doing fairly well. She still is having some back pain but is adamant about not taking medication. She plans to go back to work on Monday next week. Unfortunately she is still grieving from her brother who passed away last week. He had stage IV pancreatic cancer and lived less than one year.  [name] returns today for follow-up. Overall she denies any chest pain or worsening shortness of breath. Recently she's been having some palpitations, however she attributes that to stress. She recently is been placed on CPAP and seems to be tolerating that well. She's had some problems with memory loss but neurologic workup is been unremarkable. She recently had worsening back problems and has been to see Dr. Shelle Simpson. He is considering surgery on her back but would need her to come off of her antiplatelets  medications.  At the pleasure seeing [name] back today in follow-up. She underwent successful back surgery and is doing better. She's managed to lose significant amount of weight. She's remained on Toprol which was started preoperatively. Her surgery was without complication. Recently she's had some dizziness with change in position. This could be related to dietary changes however I did note that her diastolic pressure is low today at 56. She denies any chest pain or worsening shortness of breath.  02/12/2016  [name] returns today for follow-up. She is recently been having some more palpitations. She reports being under significant stress as her daughter and granddaughter are now living with them. She gets some fullness in her chest when she has palpitations. These episodes occur 2-3 times a week over the past month or so. She denies any exertional chest pain or worsening shortness of breath. Weight is [name] down 3 pounds since her last office visit. Blood pressure is well-controlled. She recently started Celebrex and I spoke with her primary care provider about this. She needs to be careful about taking it regularly in the setting of aspirin and Plavix use.  03/07/2016  I saw [name] back today for follow-up of her monitor. She reports she had 2 episodes of palpitations and some elevated blood pressure on the day of Thanksgiving. She was dealing with her sister who had recently had a brain bleed and had to take her to the emergency room. She apparently had some seizures.  She was also trying to cook Thanksgiving meals and felt overwhelmed. She did take some Valium with some benefit however one point said her blood pressure was up to 210/100. Since then she's felt much better. We did not to capture any episodes of arrhythmias, extrasystoles or anything concerning on her monitor. I suspect her symptoms are related to stress and anxiety. Blood pressure is fairly well-controlled  today.  10/02/2016  [name] returns today for follow-up. Unfortunate she recently broke her leg. She says she has a problem with some foot drag and she misstepped. She had have surgery and is still wearing a brace in the right leg. She is improving and has better range of motion. Blood pressure is good today 138/62. She reports recently she's been having some dizziness. Sometimes positional with sitting up at other times not positional. She is afraid of falling. She's been very anxious. In fact she's been taking Valium although she says only a few times a week. She does not think that is related to either the Valium or her pain medicine. She is concerned it could be her blood pressure although has not noted any low blood pressure readings.  07/21/2017  [name] was seen today in follow-up.  She is without any cardiac complaints.  In September she was hospitalized with chest pain symptoms.  She ruled out for MI and underwent stress testing which was low risk.  Subsequently she was noted to have significant anxiety, some degree of agoraphobia, and fear of walking by herself.  She was started on fluoxetine and has been referred to a psychologist.  She is also undergone some cognitive behavioral therapy which has been helpful.  02/04/2018  [name] was seen today in follow-up of recent hospitalization for chest pain.  She was seen by my partner [name] who felt that her pain was pleuritic, positional and reproducible on exam and not likely cardiac.  She ruled out for MI.  According to her husband recently she has been caring more things and doing more exertion and he feels that she may have strained her back.  She does describe pain and tenderness which was reproducible along the right mid thoracic area that seems to wrap around the anterior chest.  The symptoms are still persistent but somewhat better.  05/17/2020  [name] is seen today in follow-up.  She has been followed by [name] Fabian, NP more recently.   She has had some intolerance to her medications, namely her Vascepa which she currently takes 1 g twice daily.  She was switched to rosuvastatin but she had significant myalgias with this.  She went back to simvastatin 40 mg.  She has not had her lipids reassessed.  Her target LDL is less than 70 given coronary disease although fortunately recent cardiac catheterization showed no significant obstructive disease.  She denies any chest pain or worsening shortness of breath and has lost a couple pounds recently according to her home scale  Wt Readings from Last 3 Encounters:  05/17/20 206 lb (93.4 kg)  04/04/20 207 lb 4 oz (94 kg)  04/03/20 205 lb (93 kg)     Past Medical History:  Diagnosis Date  . Anemia   . Arthritis   . Bronchitis    hx of   . CAD (coronary artery disease)    a. s/p DES to LAD in 2010 with patent stent by cath in 2014 b. low-risk NST in 11/2016  . Cataracts, bilateral   . Diabetes mellitus without complication (HCC)   .  Family history of adverse reaction to anesthesia    pts mother had difficulty awakening   . GERD (gastroesophageal reflux disease)   . Hyperlipidemia LDL goal < 70   . Hypertension   . Lumbar back pain   . Numbness    left leg and foot   . Plantar fascia rupture    Left Foot  . Sleep apnea    uses CPAP     Current Outpatient Medications  Medication Sig Dispense Refill  . acetaminophen (TYLENOL) 500 MG tablet Take 500 mg by mouth every 6 (six) hours as needed for mild pain or headache.    . alendronate (FOSAMAX) 70 MG tablet Take 1 tablet (70 mg total) by mouth every 7 (seven) days. Take with a full glass of water on an empty stomach. 12 tablet 3  . aspirin EC 81 MG EC tablet Take 1 tablet (81 mg total) by mouth daily. 30 tablet 1  . Bacillus Coagulans-Inulin (ALIGN PREBIOTIC-PROBIOTIC PO) Take by mouth daily.    . calcium carbonate (OS-CAL) 1250 (500 Ca) MG chewable tablet Chew 1 tablet by mouth daily.    . cetirizine (ZYRTEC) 10 MG chewable  tablet Chew 10 mg by mouth daily as needed for allergies.    . cholecalciferol (VITAMIN D) 1000 UNITS tablet Take 1,000 Units by mouth daily.    . clopidogrel (PLAVIX) 75 MG tablet TAKE 1 TABLET BY MOUTH DAILY. 90 tablet 3  . cyclobenzaprine (FLEXERIL) 10 MG tablet TAKE 1 TABLET BY MOUTH TWICE DAILY AS NEEDED FOR MUSCLE SPASMS 30 tablet 2  . diazepam (VALIUM) 2 MG tablet TAKE 1 TABLET BY MOUTH EVERY 8 HOURS AS NEEDED FOR MUSCLE SPASMS 30 tablet 0  . diclofenac sodium (VOLTAREN) 1 % GEL Apply 2 g topically 4 (four) times daily.    . famotidine (PEPCID) 20 MG tablet TAKE 1 TABLET BY MOUTH 2 TIMES DAILY. 180 tablet 1  . FLUoxetine (PROZAC) 20 MG capsule TAKE 1 CAPSULE BY MOUTH ONCE DAILY 90 capsule 3  . fluticasone (FLONASE) 50 MCG/ACT nasal spray Place 1 spray into both nostrils daily as needed for allergies or rhinitis.    . metFORMIN (GLUCOPHAGE-XR) 500 MG 24 hr tablet Take 1 tablet (500 mg total) by mouth daily with breakfast. 90 tablet 1  . methocarbamol (ROBAXIN) 500 MG tablet Take 1 tablet (500 mg total) by mouth every 8 (eight) hours as needed for muscle spasms. 30 tablet 0  . metoprolol succinate (TOPROL-XL) 25 MG 24 hr tablet TAKE 1/2 OF A TABLET BY MOUTH DAILY. 45 tablet 3  . Multiple Vitamin (MULTIVITAMIN WITH MINERALS) TABS tablet Take 1 tablet by mouth daily.    . nitroGLYCERIN (NITROSTAT) 0.4 MG SL tablet Place 1 tablet (0.4 mg total) under the tongue every 5 (five) minutes as needed for chest pain. 25 tablet 2  . ondansetron (ZOFRAN-ODT) 4 MG disintegrating tablet Take 1 tablet (4 mg total) by mouth every 8 (eight) hours as needed for nausea or vomiting. 20 tablet 0  . simvastatin (ZOCOR) 40 MG tablet Take 1 tablet (40 mg total) by mouth daily. 90 tablet 1  . telmisartan (MICARDIS) 40 MG tablet TAKE 1 TABLET BY MOUTH EVERY MORNING 30 tablet 11  . VASCEPA 1 g capsule TAKE 2 CAPSULES BY MOUTH TWICE DAILY 120 capsule 11   Current Facility-Administered Medications  Medication Dose Route  Frequency Provider Last Rate Last Admin  . sodium chloride flush (NS) 0.9 % injection 3 mL  3 mL Intravenous Q12H Cleaver, Thomasene Ripple,  NP        Allergies:    Allergies  Allergen Reactions  . Niacin And Related Other (See Comments)    Whelps and skin flushed, mouth tingling    Social History:  The patient  reports that she has never smoked. She has never used smokeless tobacco. She reports current alcohol use. She reports that she does not use drugs.   Family history:   Family History  Problem Relation Age of Onset  . Coronary artery disease Mother   . Rheum arthritis Mother   . Dementia Mother   . Heart attack Father   . Hypertension Father   . Hyperlipidemia Father   . Other Father        MVA  . Hypertension Brother   . Cancer Brother   . Heart disease Brother   . Cancer Paternal Grandmother        stomach  . Diabetes Paternal Grandfather   . Breast cancer Neg Hx     ROS: Pertinent items noted in HPI and remainder of comprehensive ROS otherwise negative.   PHYSICAL EXAM: VS:  BP (!) 136/52 (BP Location: Left Arm, Patient Position: Sitting)   Ht 5\' 2"  (1.575 m)   Wt 206 lb (93.4 kg)   LMP  (LMP Unknown)   BMI 37.68 kg/m  General appearance: alert, no distress and morbidly obese Neck: no carotid bruit, no JVD and thyroid not enlarged, symmetric, no tenderness/mass/nodules Lungs: clear to auscultation bilaterally Heart: regular rate and rhythm Abdomen: soft, non-tender; bowel sounds normal; no masses,  no organomegaly Extremities: Pain on palpation along the right thoracic spine and the back Pulses: 2+ and symmetric Skin: Skin color, texture, turgor normal. No rashes or lesions Neurologic: Grossly normal Psych: Mildly anxious  EKG: Sinus bradycardia, RBBB 55  ASSESSMENT: 1. Musculoskeletal chest pain 2. Chronic RBBB 3. Coronary artery disease status post DES the proximal LAD in 2010, low risk Myoview (11/2016)-LVEF 78% -no significant obstructive disease by  cath in September 2021 4. Hypertension 5. Dyslipidemia 6. Obesity 7. Type 2 diabetes 8. Palpitations 9. OSA on CPAP 10. Anxiety  PLAN:  1.  Mrs. Mannina has had recent intolerance to statins.  She seems to tolerate simvastatin.  We will continue that and likely add ezetimibe 10 mg daily.  Repeat lipids today however I suspect they will not be much better than they were 4 months ago.  Plan then repeat lipids after about 3 months.  Ultimately she may need a PCSK9 inhibitor.  She denies any worsening chest pain.  Follow-up with me in 6 months or sooner as necessary.  Chrystie NoseKenneth C. Todd Argabright, MD, Divine Providence HospitalFACC, FACP  [place]  [facility]  Medical Director of the [facility] Lip[facility]  Cardiovascular Risk Reduction Clinic Diplomate of the American Board of Clinical Lipidology Attending Cardiologist  Direct Dial: [phone]  Fax: [phone]  Website:  [email or web address]

## 2020-05-17 NOTE — Patient Instructions (Signed)
Medication Instructions:  START zetia 10mg  daily CONTINUE all other current medications  *If you need a refill on your cardiac medications before your next appointment, please call your pharmacy*   Lab Work: Lipid Panel/Hepatic Function Panel today  FASTING Lipid Panel in late May 2022  If you have labs (blood work) drawn today and your tests are completely normal, you will receive your results only by: June 2022 MyChart Message (if you have MyChart) OR . A paper copy in the mail If you have any lab test that is abnormal or we need to change your treatment, we will call you to review the results.   Testing/Procedures: NONE   Follow-Up: At [facility], you and your health needs are our priority.  As part of our continuing mission to provide you with exceptional heart care, we have created designated Provider Care Teams.  These Care Teams include your primary Cardiologist (physician) and Advanced Practice Providers (APPs -  Physician Assistants and Nurse Practitioners) who all work together to provide you with the care you need, when you need it.  We recommend signing up for the patient portal called "MyChart".  Sign up information is provided on this After Visit Summary.  MyChart is used to connect with patients for Virtual Visits (Telemedicine).  Patients are able to view lab/test results, encounter notes, upcoming appointments, etc.  Non-urgent messages can be sent to your provider as well.   To learn more about what you can do with MyChart, go to [facility].    Your next appointment:   6 month(s)  The format for your next appointment:   In Person  Provider:   You may see [email or web address], MD or one of the following Advanced Practice Providers on your designated Care Team:    Chrystie Nose, PA-C  Azalee Course, PA-C or   Micah Flesher, Judy Pimple    Other Instructions

## 2020-06-05 DIAGNOSIS — G4733 Obstructive sleep apnea (adult) (pediatric): Secondary | ICD-10-CM | POA: Diagnosis not present

## 2020-06-16 ENCOUNTER — Other Ambulatory Visit (HOSPITAL_BASED_OUTPATIENT_CLINIC_OR_DEPARTMENT_OTHER): Payer: Self-pay

## 2020-06-16 NOTE — Patient Instructions (Addendum)
It was great to see you again today, as always.  Please see me in about 6 months assuming all is well  Bone density due this fall  You might try a urea and salicylic acid foot cream which you can order online - this will help to get the dry skin off your feet   We will check your A1c today- we may be able to stop metformin for you if you like

## 2020-06-16 NOTE — Progress Notes (Addendum)
[facility] [address], [address] [place], [place] [address] [phone]  Date:  06/19/2020   Name:  [name]   DOB:  March 26, 1950   MRN:  [id number]  PCP:  Pearline Cables, MD    Chief Complaint: Medical Management of Chronic Issues (6 m f/u )   History of Present Illness:  [name] is a 70 y.o. very pleasant female patient who presents with the following:  Here today for periodic follow-up visit.  Last seen by myself in January when she was having some problems with thoracic pain thought to be MSK in origin.  She followed up with my partner Dr. Carmelia Roller later on in January for the same issue, was given some Flexeril to use as needed.  She feels like this is overall improved, but still will come and go some  History of hypertension, CAD, diabetes, sleep apnea, obesity, osteopenia with elevated fracture riskon fosamax  Lab Results  Component Value Date   HGBA1C 7.0 (H) 12/20/2019   Cholesterol checked in February, improvement  She saw her cardiologist, Dr. Rennis Golden in February ASSESSMENT: 1. Musculoskeletal chest pain 2. Chronic RBBB 3. Coronary artery disease status post DES the proximal LAD in 2010, low risk Myoview (11/2016)-LVEF 78% -no significant obstructive disease by cath in September 2021 4. Hypertension 5. Dyslipidemia 6. Obesity 7. Type 2 diabetes 8. Palpitations 9. OSA on CPAP 10. Anxiety  PLAN: 1.  Mrs. Drew has had recent intolerance to statins.  She seems to tolerate simvastatin.  We will continue that and likely add ezetimibe 10 mg daily.  Repeat lipids today however I suspect they will not be much better than they were 4 months ago.  Plan then repeat lipids after about 3 months.  Ultimately she may need a PCSK9 inhibitor.  She denies any worsening chest pain.  Update A1c today Update foot exam Eye exam, Colon cancer screen, mammogram up-to-date  She would like to set a goal of getting off  metformin She has lost about 15 lbs through diet change She has done well with simvastatin and Zetia, has been able to tolerate  She is trying to exercise much as she can, she does have some chronic knee problems  Wt Readings from Last 3 Encounters:  06/19/20 206 lb 4.8 oz (93.6 kg)  05/17/20 206 lb (93.4 kg)  04/04/20 207 lb 4 oz (94 kg)     Patient Active Problem List   Diagnosis Date Noted  . Obstructive sleep apnea treated with continuous positive airway pressure (CPAP) 05/07/2018  . Chest pain 01/31/2018  . Morbid obesity (HCC) 11/03/2017  . Coronary artery disease involving native coronary artery of native heart with unstable angina pectoris (HCC) 11/03/2017  . Insomnia 11/03/2017  . Inadequate sleep hygiene 11/03/2017  . Anxiety 07/21/2017  . Dizziness 10/02/2016  . Right patella fracture 08/29/2016  . Acquired foot deformity, left 07/18/2016  . Osteopenia 11/10/2015  . HNP (herniated nucleus pulposus), lumbar 10/12/2014  . Spinal stenosis at L4-L5 level 10/12/2014  . Iron deficiency anemia 07/29/2014  . DM (diabetes mellitus) with complications (HCC) 07/29/2014  . Environmental and seasonal allergies 04/28/2014  . Spinal stenosis, lumbar region, with neurogenic claudication 01/06/2013  . Obesity, morbid, BMI 40.0-49.9 (HCC) 10/20/2012  . Chest pain with moderate risk of acute coronary syndrome 10/02/2012  . CAD S/P percutaneous coronary angioplasty   . Hyperlipidemia with target LDL less than 70   . Essential hypertension  04/16/2012    Past Medical History:  Diagnosis Date  . Anemia   . Arthritis   . Bronchitis    hx of   . CAD (coronary artery disease)    a. s/p DES to LAD in 2010 with patent stent by cath in 2014 b. low-risk NST in 11/2016  . Cataracts, bilateral   . Diabetes mellitus without complication (HCC)   . Family history of adverse reaction to anesthesia    pts mother had difficulty awakening   . GERD (gastroesophageal reflux disease)   .  Hyperlipidemia LDL goal < 70   . Hypertension   . Lumbar back pain   . Numbness    left leg and foot   . Plantar fascia rupture    Left Foot  . Sleep apnea    uses CPAP     Past Surgical History:  Procedure Laterality Date  . ABDOMINAL HYSTERECTOMY    . BACK SURGERY    . BREAST CYST ASPIRATION  1995  . CAROTID STENT  2009   pt denies   . CORONARY ANGIOPLASTY WITH STENT PLACEMENT  2010and 10-02-2012   Stent DES, Xience to prox. LAD  . DOPPLER ECHOCARDIOGRAPHY  08/01/2009   EF=>55%,LV normal  . LEFT HEART CATH AND CORONARY ANGIOGRAPHY N/A 12/10/2019   Procedure: LEFT HEART CATH AND CORONARY ANGIOGRAPHY;  Surgeon: Lyn Records, MD;  Location: [facility] INVASIVE CV LAB;  Service: Cardiovascular;  Laterality: N/A;  . LEFT HEART CATHETERIZATION WITH CORONARY ANGIOGRAM N/A 10/02/2012   Procedure: LEFT HEART CATHETERIZATION WITH CORONARY ANGIOGRAM;  Surgeon: Runell Gess, MD;  Location: [facility] CATH LAB;  Service: Cardiovascular;  Laterality: N/A;  . lower arterial duplex  06/20/10   abi's normal,rgt 0.98,lft 1.06;bilateral PVRs normal  . LUMBAR LAMINECTOMY/DECOMPRESSION MICRODISCECTOMY Left 01/06/2013   Procedure: MICRO LUMBAR DECOMPRESSION L4-5 AND L5-S1;  Surgeon: Javier Docker, MD;  Location: [facility] ORS;  Service: Orthopedics;  Laterality: Left;  . LUMBAR LAMINECTOMY/DECOMPRESSION MICRODISCECTOMY Left 10/12/2014   Procedure: REVISION MICRO LUMBAR/DECOMPRESSION L4-5 LEFT ;  Surgeon: Jene Every, MD;  Location: [facility] ORS;  Service: Orthopedics;  Laterality: Left;  . NM MYOCAR PERF WALL MOTION  09/22/2008   lexiscan-EF 83%; glogal LV systolic fx is norm. ,evidence of mild ischemia basal anterior,midanterior and apical lateral region(s).   . ORIF PATELLA Right 08/29/2016   Procedure: OPEN REDUCTION INTERNAL (ORIF) FIXATION RIGHT PATELLA;  Surgeon: Samson Frederic, MD;  Location: [facility] ORS;  Service: Orthopedics;  Laterality: Right;  Adductor Block  . TUBAL LIGATION    . TYMPANOPLASTY Bilateral   .  UVULOPALATOPHARYNGOPLASTY     pt denies     Social History   Tobacco Use  . Smoking status: Never Smoker  . Smokeless tobacco: Never Used  Vaping Use  . Vaping Use: Never used  Substance Use Topics  . Alcohol use: Yes    Comment: occasional, 1 a month wine  . Drug use: No    Family History  Problem Relation Age of Onset  . Coronary artery disease Mother   . Rheum arthritis Mother   . Dementia Mother   . Heart attack Father   . Hypertension Father   . Hyperlipidemia Father   . Other Father        MVA  . Hypertension Brother   . Cancer Brother   . Heart disease Brother   . Cancer Paternal Grandmother        stomach  . Diabetes Paternal Grandfather   . Breast cancer Neg Hx  Allergies  Allergen Reactions  . Crestor [Rosuvastatin] Other (See Comments)    myalgia  . Niacin And Related Other (See Comments)    Whelps and skin flushed, mouth tingling    Medication list has been reviewed and updated.  Current Outpatient Medications on File Prior to Visit  Medication Sig Dispense Refill  . acetaminophen (TYLENOL) 500 MG tablet Take 500 mg by mouth every 6 (six) hours as needed for mild pain or headache.    . alendronate (FOSAMAX) 70 MG tablet Take 1 tablet (70 mg total) by mouth every 7 (seven) days. Take with a full glass of water on an empty stomach. 12 tablet 3  . aspirin EC 81 MG EC tablet Take 1 tablet (81 mg total) by mouth daily. 30 tablet 1  . Bacillus Coagulans-Inulin (ALIGN PREBIOTIC-PROBIOTIC PO) Take by mouth daily.    . calcium carbonate (OS-CAL) 1250 (500 Ca) MG chewable tablet Chew 1 tablet by mouth daily.    . cetirizine (ZYRTEC) 10 MG chewable tablet Chew 10 mg by mouth daily as needed for allergies.    . cholecalciferol (VITAMIN D) 1000 UNITS tablet Take 1,000 Units by mouth daily.    . clopidogrel (PLAVIX) 75 MG tablet TAKE 1 TABLET BY MOUTH DAILY. 90 tablet 3  . cyclobenzaprine (FLEXERIL) 10 MG tablet TAKE 1 TABLET BY MOUTH TWICE DAILY AS NEEDED FOR  MUSCLE SPASMS 30 tablet 2  . diazepam (VALIUM) 2 MG tablet TAKE 1 TABLET BY MOUTH EVERY 8 HOURS AS NEEDED FOR MUSCLE SPASMS 30 tablet 0  . diclofenac sodium (VOLTAREN) 1 % GEL Apply 2 g topically 4 (four) times daily.    Marland Kitchen. ezetimibe (ZETIA) 10 MG tablet Take 1 tablet (10 mg total) by mouth daily. 90 tablet 3  . famotidine (PEPCID) 20 MG tablet TAKE 1 TABLET BY MOUTH 2 TIMES DAILY. 180 tablet 1  . FLUoxetine (PROZAC) 20 MG capsule TAKE 1 CAPSULE BY MOUTH ONCE DAILY 90 capsule 3  . fluticasone (FLONASE) 50 MCG/ACT nasal spray Place 1 spray into both nostrils daily as needed for allergies or rhinitis.    . metFORMIN (GLUCOPHAGE-XR) 500 MG 24 hr tablet Take 1 tablet (500 mg total) by mouth daily with breakfast. 90 tablet 1  . methocarbamol (ROBAXIN) 500 MG tablet Take 1 tablet (500 mg total) by mouth every 8 (eight) hours as needed for muscle spasms. 30 tablet 0  . metoprolol succinate (TOPROL-XL) 25 MG 24 hr tablet TAKE 1/2 OF A TABLET BY MOUTH DAILY. 45 tablet 3  . Multiple Vitamin (MULTIVITAMIN WITH MINERALS) TABS tablet Take 1 tablet by mouth daily.    . nitroGLYCERIN (NITROSTAT) 0.4 MG SL tablet Place 1 tablet (0.4 mg total) under the tongue every 5 (five) minutes as needed for chest pain. 25 tablet 2  . ondansetron (ZOFRAN-ODT) 4 MG disintegrating tablet Take 1 tablet (4 mg total) by mouth every 8 (eight) hours as needed for nausea or vomiting. 20 tablet 0  . simvastatin (ZOCOR) 40 MG tablet Take 1 tablet (40 mg total) by mouth daily. 90 tablet 1  . telmisartan (MICARDIS) 40 MG tablet TAKE 1 TABLET BY MOUTH EVERY MORNING 30 tablet 11  . VASCEPA 1 g capsule TAKE 2 CAPSULES BY MOUTH TWICE DAILY 120 capsule 11   Current Facility-Administered Medications on File Prior to Visit  Medication Dose Route Frequency Provider Last Rate Last Admin  . sodium chloride flush (NS) 0.9 % injection 3 mL  3 mL Intravenous Q12H Cleaver, Thomasene RippleJesse M, NP  Review of Systems:  As per HPI- otherwise  negative.   Physical Examination: Vitals:   06/19/20 0829  BP: 122/60  Pulse: (!) 58  Temp: 97.6 F (36.4 C)  SpO2: 96%   Vitals:   06/19/20 0829  Weight: 206 lb 4.8 oz (93.6 kg)  Height: 5\' 1"  (1.549 m)   Body mass index is 38.98 kg/m. Ideal Body Weight: Weight in (lb) to have BMI = 25: 132  GEN: no acute distress.   Obese, looks well  HEENT: Atraumatic, Normocephalic.  Ears and Nose: No external deformity. CV: RRR, No M/G/R. No JVD. No thrill. No extra heart sounds. PULM: CTA B, no wheezes, crackles, rhonchi. No retractions. No resp. distress. No accessory muscle use. ABD: S, NT, ND, +BS. No rebound. No HSM. EXTR: No c/c/e PSYCH: Normally interactive. Conversant.  Foot exam- normal except for dry skin  Assessment and Plan: DM (diabetes mellitus) with complications (HCC) - Plan: Basic metabolic panel, Hemoglobin A1c  Hyperlipidemia with target LDL less than 70  CAD S/P percutaneous coronary angioplasty - Plan: CBC  Morbid obesity (HCC)  Obstructive sleep apnea treated with continuous positive airway pressure (CPAP)  Essential hypertension - Plan: Basic metabolic panel, CBC  Following up today.  We will check A1c.  She would like to stop Metformin if her A1c will allow Blood pressure under good control, continue current medication regimen She is on CPAP for sleep apnea This visit occurred during the SARS-CoV-2 public health emergency.  Safety protocols were in place, including screening questions prior to the visit, additional usage of staff PPE, and extensive cleaning of exam room while observing appropriate contact time as indicated for disinfecting solutions.    Signed , MD  Received her labs as below, message to patient  Results for orders placed or performed in visit on 06/19/20  Basic metabolic panel  Result Value Ref Range   Sodium 139 135 - 145 mEq/L   Potassium 4.3 3.5 - 5.1 mEq/L   Chloride 103 96 - 112 mEq/L   CO2 28 19 - 32 mEq/L    Glucose, Bld 148 (H) 70 - 99 mg/dL   BUN 16 6 - 23 mg/dL   Creatinine, Ser 06/21/20 0.40 - 1.20 mg/dL   GFR 5.02 77.41 mL/min   Calcium 9.7 8.4 - 10.5 mg/dL  Hemoglobin >28.78  Result Value Ref Range   Hgb A1c MFr Bld 7.2 (H) 4.6 - 6.5 %  CBC  Result Value Ref Range   WBC 9.4 4.0 - 10.5 K/uL   RBC 4.73 3.87 - 5.11 Mil/uL   Platelets 292.0 150.0 - 400.0 K/uL   Hemoglobin 12.0 12.0 - 15.0 g/dL   HCT M7E 72.0 - 94.7 %   MCV 79.0 78.0 - 100.0 fl   MCHC 32.0 30.0 - 36.0 g/dL   RDW 09.6 28.3 - 66.2 %

## 2020-06-19 ENCOUNTER — Encounter: Payer: Self-pay | Admitting: Family Medicine

## 2020-06-19 ENCOUNTER — Ambulatory Visit (INDEPENDENT_AMBULATORY_CARE_PROVIDER_SITE_OTHER): Payer: PPO | Admitting: Family Medicine

## 2020-06-19 ENCOUNTER — Other Ambulatory Visit: Payer: Self-pay

## 2020-06-19 VITALS — BP 122/60 | HR 58 | Temp 97.6°F | Ht 61.0 in | Wt 206.3 lb

## 2020-06-19 DIAGNOSIS — E118 Type 2 diabetes mellitus with unspecified complications: Secondary | ICD-10-CM | POA: Diagnosis not present

## 2020-06-19 DIAGNOSIS — Z9989 Dependence on other enabling machines and devices: Secondary | ICD-10-CM

## 2020-06-19 DIAGNOSIS — I2510 Atherosclerotic heart disease of native coronary artery without angina pectoris: Secondary | ICD-10-CM | POA: Diagnosis not present

## 2020-06-19 DIAGNOSIS — Z9861 Coronary angioplasty status: Secondary | ICD-10-CM | POA: Diagnosis not present

## 2020-06-19 DIAGNOSIS — G4733 Obstructive sleep apnea (adult) (pediatric): Secondary | ICD-10-CM

## 2020-06-19 DIAGNOSIS — E785 Hyperlipidemia, unspecified: Secondary | ICD-10-CM

## 2020-06-19 DIAGNOSIS — I10 Essential (primary) hypertension: Secondary | ICD-10-CM

## 2020-06-19 LAB — CBC
HCT: 37.4 % (ref 36.0–46.0)
Hemoglobin: 12 g/dL (ref 12.0–15.0)
MCHC: 32 g/dL (ref 30.0–36.0)
MCV: 79 fl (ref 78.0–100.0)
Platelets: 292 10*3/uL (ref 150.0–400.0)
RBC: 4.73 Mil/uL (ref 3.87–5.11)
RDW: 15.2 % (ref 11.5–15.5)
WBC: 9.4 10*3/uL (ref 4.0–10.5)

## 2020-06-19 LAB — BASIC METABOLIC PANEL
BUN: 16 mg/dL (ref 6–23)
CO2: 28 mEq/L (ref 19–32)
Calcium: 9.7 mg/dL (ref 8.4–10.5)
Chloride: 103 mEq/L (ref 96–112)
Creatinine, Ser: 0.82 mg/dL (ref 0.40–1.20)
GFR: 72.35 mL/min (ref >60.00)
Glucose, Bld: 148 mg/dL — ABNORMAL HIGH (ref 70–99)
Potassium: 4.3 mEq/L (ref 3.5–5.1)
Sodium: 139 mEq/L (ref 135–145)

## 2020-06-19 LAB — HEMOGLOBIN A1C: Hgb A1c MFr Bld: 7.2 % — ABNORMAL HIGH (ref 4.6–6.5)

## 2020-06-26 ENCOUNTER — Other Ambulatory Visit (HOSPITAL_COMMUNITY): Payer: Self-pay

## 2020-06-28 ENCOUNTER — Other Ambulatory Visit (HOSPITAL_COMMUNITY): Payer: Self-pay

## 2020-06-28 MED FILL — Metoprolol Succinate Tab ER 24HR 25 MG (Tartrate Equiv): ORAL | 90 days supply | Qty: 45 | Fill #0 | Status: AC

## 2020-07-15 MED FILL — Telmisartan Tab 40 MG: ORAL | 30 days supply | Qty: 30 | Fill #0 | Status: AC

## 2020-07-17 ENCOUNTER — Other Ambulatory Visit (HOSPITAL_COMMUNITY): Payer: Self-pay

## 2020-07-18 ENCOUNTER — Other Ambulatory Visit (HOSPITAL_COMMUNITY): Payer: Self-pay

## 2020-07-23 MED FILL — Clopidogrel Bisulfate Tab 75 MG (Base Equiv): ORAL | 90 days supply | Qty: 90 | Fill #0 | Status: AC

## 2020-07-24 ENCOUNTER — Other Ambulatory Visit (HOSPITAL_COMMUNITY): Payer: Self-pay

## 2020-07-26 ENCOUNTER — Encounter: Payer: Self-pay | Admitting: Family Medicine

## 2020-07-26 NOTE — Telephone Encounter (Signed)
Mychart sent in husbands chart

## 2020-07-27 ENCOUNTER — Ambulatory Visit: Payer: PPO | Attending: Internal Medicine

## 2020-07-27 DIAGNOSIS — Z23 Encounter for immunization: Secondary | ICD-10-CM

## 2020-07-27 NOTE — Progress Notes (Signed)
   Covid-19 Vaccination Clinic  Name:  [name]    MRN: [id number] DOB: Jan 31, 1951  07/27/2020  Ms. Seat was observed post Covid-19 immunization for 15 minutes without incident. She was provided with Vaccine Information Sheet and instruction to access the V-Safe system.   [name] was instructed to call 911 with any severe reactions post vaccine: Marland Kitchen Difficulty breathing  . Swelling of face and throat  . A fast heartbeat  . A bad rash all over body  . Dizziness and weakness   Immunizations Administered    Name Date Dose VIS Date Route   PFIZER Comrnaty(Gray TOP) Covid-19 Vaccine 07/27/2020  9:54 AM 0.3 mL 03/02/2020 Intramuscular   Manufacturer: [employer], [employer]   Lot: [id number]   NDC: [phone]

## 2020-08-01 ENCOUNTER — Other Ambulatory Visit (HOSPITAL_BASED_OUTPATIENT_CLINIC_OR_DEPARTMENT_OTHER): Payer: Self-pay

## 2020-08-01 MED ORDER — PFIZER-BIONT COVID-19 VAC-TRIS 30 MCG/0.3ML IM SUSP
INTRAMUSCULAR | 0 refills | Status: DC
  Filled 2020-08-01: qty 0.3, 1d supply, fill #0

## 2020-08-04 ENCOUNTER — Other Ambulatory Visit (HOSPITAL_COMMUNITY): Payer: Self-pay

## 2020-08-04 ENCOUNTER — Other Ambulatory Visit: Payer: Self-pay | Admitting: Family Medicine

## 2020-08-04 DIAGNOSIS — E119 Type 2 diabetes mellitus without complications: Secondary | ICD-10-CM

## 2020-08-04 MED FILL — Metformin HCl Tab ER 24HR 500 MG: ORAL | 90 days supply | Qty: 90 | Fill #0 | Status: AC

## 2020-08-07 ENCOUNTER — Other Ambulatory Visit (HOSPITAL_COMMUNITY): Payer: Self-pay

## 2020-08-07 MED ORDER — METFORMIN HCL ER 500 MG PO TB24
500.0000 mg | ORAL_TABLET | Freq: Every day | ORAL | 1 refills | Status: DC
  Filled 2020-08-07 – 2020-12-04 (×2): qty 90, 90d supply, fill #0
  Filled 2021-02-13: qty 90, 90d supply, fill #1

## 2020-08-08 ENCOUNTER — Telehealth: Payer: Self-pay | Admitting: Pharmacist

## 2020-08-08 NOTE — Chronic Care Management (AMB) (Signed)
Chronic Care Management Pharmacy Assistant   Name: [name]  MRN: [id number] DOB: 1951-01-11   Reason for Encounter: Disease State Hypertension    Recent office visits:  06/19/2020 Abbe Amsterdam, MD (PCP) General follow up. Follow up in 6 months. 04/04/2020 Raphael Gibney, DO-Seen for flank pain. 04/03/2020 Abbe Amsterdam, MD (PCP) Seen for back pain.  Recent consult visits:  05/17/2020 Zoila Shutter, MD (Cardiology) Chest pain follow up. Start Ezetimibe 10 mg daily. Follow up in 6 months.  Hospital visits:  None in previous 6 months  Medications: Outpatient Encounter Medications as of 08/08/2020  Medication Sig  . acetaminophen (TYLENOL) 500 MG tablet Take 500 mg by mouth every 6 (six) hours as needed for mild pain or headache.  Marland Kitchen aspirin EC 81 MG EC tablet Take 1 tablet (81 mg total) by mouth daily.  . Bacillus Coagulans-Inulin (ALIGN PREBIOTIC-PROBIOTIC PO) Take by mouth daily.  . calcium carbonate (OS-CAL) 1250 (500 Ca) MG chewable tablet Chew 1 tablet by mouth daily.  . cetirizine (ZYRTEC) 10 MG chewable tablet Chew 10 mg by mouth daily as needed for allergies.  . cholecalciferol (VITAMIN D) 1000 UNITS tablet Take 1,000 Units by mouth daily.  . clopidogrel (PLAVIX) 75 MG tablet TAKE 1 TABLET BY MOUTH DAILY.  Marland Kitchen COVID-19 mRNA Vac-TriS, Pfizer, (PFIZER-BIONT COVID-19 VAC-TRIS) SUSP injection Inject into the muscle.  . cyclobenzaprine (FLEXERIL) 10 MG tablet TAKE 1 TABLET BY MOUTH TWICE DAILY AS NEEDED FOR MUSCLE SPASMS  . diazepam (VALIUM) 2 MG tablet TAKE 1 TABLET BY MOUTH EVERY 8 HOURS AS NEEDED FOR MUSCLE SPASMS  . diclofenac sodium (VOLTAREN) 1 % GEL Apply 2 g topically 4 (four) times daily.  Marland Kitchen ezetimibe (ZETIA) 10 MG tablet TAKE 1 TABLET BY MOUTH ONCE A DAY  . famotidine (PEPCID) 20 MG tablet TAKE 1 TABLET BY MOUTH 2 TIMES DAILY.  Marland Kitchen FLUoxetine (PROZAC) 20 MG capsule TAKE 1 CAPSULE BY MOUTH ONCE DAILY  . fluticasone (FLONASE) 50 MCG/ACT nasal spray Place 1 spray  into both nostrils daily as needed for allergies or rhinitis.  Marland Kitchen icosapent Ethyl (VASCEPA) 1 g capsule TAKE 2 CAPSULES BY MOUTH TWICE DAILY  . metFORMIN (GLUCOPHAGE-XR) 500 MG 24 hr tablet Take 1 tablet (500 mg total) by mouth daily with breakfast.  . methocarbamol (ROBAXIN) 500 MG tablet TAKE 1 TABLET BY MOUTH EVERY 8 HOURS AS NEEDED FOR MUSCLE SPASMS  . metoprolol succinate (TOPROL-XL) 25 MG 24 hr tablet TAKE 1/2 OF A TABLET BY MOUTH DAILY.  . Multiple Vitamin (MULTIVITAMIN WITH MINERALS) TABS tablet Take 1 tablet by mouth daily.  . nitroGLYCERIN (NITROSTAT) 0.4 MG SL tablet Place 1 tablet (0.4 mg total) under the tongue every 5 (five) minutes as needed for chest pain.  Marland Kitchen ondansetron (ZOFRAN-ODT) 4 MG disintegrating tablet TAKE 1 TABLET BY MOUTH EVERY 8 HOURS AS NEEDED FOR NAUSEA OR VOMITING.  . simvastatin (ZOCOR) 40 MG tablet TAKE 1 TABLET BY MOUTH DAILY.  Marland Kitchen telmisartan (MICARDIS) 40 MG tablet TAKE 1 TABLET BY MOUTH EVERY MORNING  . telmisartan (MICARDIS) 40 MG tablet TAKE 1 TABLET BY MOUTH EVERY MORNING * NEEDS OFFICE VISIT  . [DISCONTINUED] rosuvastatin (CRESTOR) 20 MG tablet Take 1 tablet (20 mg total) by mouth daily.   Facility-Administered Encounter Medications as of 08/08/2020  Medication  . sodium chloride flush (NS) 0.9 % injection 3 mL   Reviewed chart prior to disease state call. Spoke with patient regarding BP  Recent Office Vitals: BP Readings from Last 3 Encounters:  06/19/20  122/60  05/17/20 (!) 136/52  04/04/20 138/82   Pulse Readings from Last 3 Encounters:  06/19/20 (!) 58  04/04/20 61  04/03/20 (!) 59    Wt Readings from Last 3 Encounters:  06/19/20 206 lb 4.8 oz (93.6 kg)  05/17/20 206 lb (93.4 kg)  04/04/20 207 lb 4 oz (94 kg)     Kidney Function Lab Results  Component Value Date/Time   CREATININE 0.82 06/19/2020 08:54 AM   CREATININE 0.81 11/18/2019 02:18 PM   CREATININE 0.82 09/29/2014 09:10 AM   CREATININE 0.73 09/27/2013 08:46 AM   GFR 72.35  06/19/2020 08:54 AM   GFRNONAA >60 11/18/2019 02:18 PM   GFRAA >60 11/18/2019 02:18 PM    BMP Latest Ref Rng & Units 06/19/2020 11/18/2019 07/01/2019  Glucose 70 - 99 mg/dL 329(J) 188(C) 166(A)  BUN 6 - 23 mg/dL 16 17 17   Creatinine 0.40 - 1.20 mg/dL 6.30 1.60  Sodium 135 - 145 mEq/L 139 139 139  Potassium 3.5 - 5.1 mEq/L 4.3 4.1 4.1  Chloride 96 - 112 mEq/L 103 103 105  CO2 19 - 32 mEq/L 28 24 25   Calcium 8.4 - 10.5 mg/dL 9.7 9.7 9.3    . Current antihypertensive regimen:  o Telmisartan 40 mg take 1 tab daily o Metoprolol Succinate 25 mg take 1/2 tab daily  . How often are you checking your Blood Pressure?   Patient states she checks it 1-2 a month.  . Current home BP readings:   Patient states she does not check her blood pressure unless she feels "bad"  . What recent interventions/DTPs have been made by any provider to improve Blood Pressure control since last CPP Visit: None noted  . Any recent hospitalizations or ED visits since last visit with CPP? No   . What diet changes have been made to improve Blood Pressure Control?  o Patient states she has lost 15 lbs and has been doing less salt intake. . What exercise is being done to improve your Blood Pressure Control?  o Patient states she walks around her house and uses a stationary bike.  Adherence Review: Is the patient currently on ACE/ARB medication? Yes Does the patient have >5 day gap between last estimated fill dates? No    Star Rating Drugs: Metformin 500 mg Last filled:08/04/2020 90DS Simvastatin 40 mg Last filled:05/09/2020 90 DS Telmisartan 40 mg Last filled:07/18/2020 30DS  [facility] Clinical Pharmacist Assistant [phone]

## 2020-08-15 MED FILL — Ezetimibe Tab 10 MG: ORAL | 90 days supply | Qty: 90 | Fill #0 | Status: AC

## 2020-08-16 ENCOUNTER — Other Ambulatory Visit (HOSPITAL_COMMUNITY): Payer: Self-pay

## 2020-08-17 ENCOUNTER — Other Ambulatory Visit: Payer: Self-pay | Admitting: Internal Medicine

## 2020-08-17 ENCOUNTER — Other Ambulatory Visit (HOSPITAL_COMMUNITY): Payer: Self-pay

## 2020-08-17 DIAGNOSIS — I2583 Coronary atherosclerosis due to lipid rich plaque: Secondary | ICD-10-CM | POA: Diagnosis not present

## 2020-08-17 DIAGNOSIS — I2510 Atherosclerotic heart disease of native coronary artery without angina pectoris: Secondary | ICD-10-CM | POA: Diagnosis not present

## 2020-08-17 DIAGNOSIS — E785 Hyperlipidemia, unspecified: Secondary | ICD-10-CM | POA: Diagnosis not present

## 2020-08-17 MED ORDER — TELMISARTAN 40 MG PO TABS
ORAL_TABLET | ORAL | 2 refills | Status: DC
  Filled 2020-08-17: qty 90, 90d supply, fill #0
  Filled 2020-12-04: qty 90, 90d supply, fill #1
  Filled 2021-03-13: qty 90, 90d supply, fill #2

## 2020-08-18 ENCOUNTER — Other Ambulatory Visit (HOSPITAL_COMMUNITY): Payer: Self-pay

## 2020-08-18 DIAGNOSIS — E785 Hyperlipidemia, unspecified: Secondary | ICD-10-CM

## 2020-08-18 LAB — LIPID PANEL
Chol/HDL Ratio: 5.8 ratio — ABNORMAL HIGH (ref 0.0–4.4)
Cholesterol, Total: 169 mg/dL (ref 100–199)
HDL: 29 mg/dL — ABNORMAL LOW (ref >39)
LDL Chol Calc (NIH): 57 mg/dL (ref 0–99)
Triglycerides: 551 mg/dL (ref 0–149)
VLDL Cholesterol Cal: 83 mg/dL — ABNORMAL HIGH (ref 5–40)

## 2020-08-18 MED ORDER — FENOFIBRATE 145 MG PO TABS
145.0000 mg | ORAL_TABLET | Freq: Every day | ORAL | 3 refills | Status: DC
  Filled 2020-08-18: qty 90, 90d supply, fill #0
  Filled 2020-11-20: qty 90, 90d supply, fill #1
  Filled 2021-02-13: qty 90, 90d supply, fill #2
  Filled 2021-07-03: qty 90, 90d supply, fill #3

## 2020-08-18 NOTE — Telephone Encounter (Signed)
Trigs remain a problem - would continue simvastatin, ezetimibe and Vasepa - add fenofibrate 145 mg daily. Repeat fasting lipid in 3 months.  Dr Rexene Edison

## 2020-08-22 ENCOUNTER — Other Ambulatory Visit: Payer: Self-pay | Admitting: Family Medicine

## 2020-08-22 ENCOUNTER — Other Ambulatory Visit (HOSPITAL_COMMUNITY): Payer: Self-pay

## 2020-08-22 MED ORDER — ALENDRONATE SODIUM 70 MG PO TABS
ORAL_TABLET | ORAL | 3 refills | Status: DC
  Filled 2020-08-22: qty 12, 84d supply, fill #0

## 2020-08-23 ENCOUNTER — Other Ambulatory Visit (HOSPITAL_COMMUNITY): Payer: Self-pay

## 2020-08-26 ENCOUNTER — Other Ambulatory Visit (HOSPITAL_COMMUNITY): Payer: Self-pay

## 2020-08-26 MED FILL — Icosapent Ethyl Cap 1 GM: ORAL | 30 days supply | Qty: 120 | Fill #0 | Status: AC

## 2020-09-04 ENCOUNTER — Other Ambulatory Visit (HOSPITAL_COMMUNITY): Payer: Self-pay

## 2020-09-04 MED FILL — Fluoxetine HCl Cap 20 MG: ORAL | 90 days supply | Qty: 90 | Fill #0 | Status: AC

## 2020-09-06 DIAGNOSIS — G4733 Obstructive sleep apnea (adult) (pediatric): Secondary | ICD-10-CM | POA: Diagnosis not present

## 2020-09-24 MED FILL — Metoprolol Succinate Tab ER 24HR 25 MG (Tartrate Equiv): ORAL | 90 days supply | Qty: 45 | Fill #1 | Status: AC

## 2020-09-25 ENCOUNTER — Other Ambulatory Visit (HOSPITAL_COMMUNITY): Payer: Self-pay

## 2020-09-26 ENCOUNTER — Other Ambulatory Visit (HOSPITAL_COMMUNITY): Payer: Self-pay

## 2020-09-28 ENCOUNTER — Ambulatory Visit (INDEPENDENT_AMBULATORY_CARE_PROVIDER_SITE_OTHER): Payer: PPO | Admitting: Pharmacist

## 2020-09-28 DIAGNOSIS — I10 Essential (primary) hypertension: Secondary | ICD-10-CM

## 2020-09-28 DIAGNOSIS — I2510 Atherosclerotic heart disease of native coronary artery without angina pectoris: Secondary | ICD-10-CM | POA: Diagnosis not present

## 2020-09-28 DIAGNOSIS — E118 Type 2 diabetes mellitus with unspecified complications: Secondary | ICD-10-CM

## 2020-09-28 DIAGNOSIS — Z9861 Coronary angioplasty status: Secondary | ICD-10-CM | POA: Diagnosis not present

## 2020-09-28 DIAGNOSIS — E785 Hyperlipidemia, unspecified: Secondary | ICD-10-CM | POA: Diagnosis not present

## 2020-10-03 NOTE — Patient Instructions (Signed)
Visit Information  PATIENT GOALS:  Goals Addressed             This Visit's Progress    Chronic Care Management Pharmacy Care Plan   On track    CARE PLAN ENTRY (see longitudinal plan of care for additional care plan information)  Current Barriers:  Chronic Disease Management support, education, and care coordination needs related to DM, HTN, HLD, CAD, Depression, GERD, Osteopenia, Obesity, Allergic Rhinitis, Pain   Hypertension BP Readings from Last 3 Encounters:  06/19/20 122/60  05/17/20 (!) 136/52  04/04/20 138/82  Pharmacist Clinical Goal(s): Over the next 90 days, patient will work with PharmD and providers to achieve BP goal <130/80 Current regimen:  Telmisartan 40mg  daily Metoprolol succinate 25mg  1/2 tab daily Interventions: Requested patient to check blood pressure 3 to 4  times per week and record Patient self care activities - Over the next 90 days, patient will: Check blood pressure 3 to 4 times per week, document, and provide at future appointments Ensure daily salt intake < 2300 mg/day  Hyperlipidemia Lipid Panel     Component Value Date/Time   CHOL 169 08/17/2020 1405   TRIG 551 (HH) 08/17/2020 1405   HDL 29 (L) 08/17/2020 1405   CHOLHDL 5.8 (H) 08/17/2020 1405   CHOLHDL 6.3 (H) 12/20/2019 0945   VLDL 30.8 07/01/2019 0921   LDLCALC 57 08/17/2020 1405   LDLCALC  12/20/2019 0945     Comment:     . LDL cholesterol not calculated. Triglyceride levels greater than 400 mg/dL invalidate calculated LDL results. . Reference range: <100 . Desirable range <100 mg/dL for primary prevention;   <70 mg/dL for patients with CHD or diabetic patients  with > or = 2 CHD risk factors. 08/19/2020 LDL-C is now calculated using the Martin-Hopkins  calculation, which is a validated novel method providing  better accuracy than the Friedewald equation in the  estimation of LDL-C.  12/22/2019 et al. Marland Kitchen. Horald Pollen): 2061-2068   (http://education.[facility].com/faq/FAQ164)    LDLDIRECT 82.0 11/16/2018 1010   LABVLDL 83 (H) 08/17/2020 1405   Pharmacist Clinical Goal(s): Over the next 90 days, patient will work with PharmD and providers to achieve LDL goal < 70 and Tg <150 Current regimen:  Simvastatin 40mg  daily (changed back from crestor on 02/24/20 per Cardio due to patient having muscle pain with crestor) Vascepa 1g - take 1 capsule twice a day Ezetimibe 10mg  daily  Fenofibrate 145mg  daily Interventions: Encouraged adherence to lipid lowering therapy Coordinated with Dr 08/19/2020 regarding recent increase in muscle pain possibly related to simvastatin and suggested consider trial of pravastatin 40 or 80mg . Dr recommended patient hold simvastatin for 2 weeks to see if myslgias resolved.  Patient self care activities - Over the next 90 days, patient will: Hold simvastatin for the next 2 weeks per Dr 14/2/21 to see if muscle aches improved.  Continue other lipid lowering medications listed above.  Continue to follow up with Dr .   Diabetes Lab Results  Component Value Date/Time   HGBA1C 7.2 (H) 06/19/2020 08:54 AM   HGBA1C 7.0 (H) 12/20/2019 09:45 AM  Pharmacist Clinical Goal(s): Over the next 90 days, patient will work with PharmD and providers to achieve A1c goal <7% Current regimen:  Metformin ER 500mg  daily Interventions: Discussed importance of diet and exercise Discussed possibly therapies for diabetes that might also help with weight loss - Ozempic, Trulicity, Jardinace.  Patient self care activities - Over the next 90 days, patient will: Check blood sugar once  daily, document, and provide at future appointments Contact provider with any episodes of hypoglycemia Continue current regimen for diabetes.   Medication management Pharmacist Clinical Goal(s): Over the next 180 days, patient will work with PharmD and providers to achieve optimal medication adherence Current pharmacy: [facility] Interventions Comprehensive medication review performed. Continue current medication management strategy Patient self care activities - Over the next 180 days, patient will: Focus on medication adherence by filling and taking medications appropriately  Take medications as prescribed Report any questions or concerns to PharmD and/or provider(s)  Please see past updates related to this goal by clicking on the "Past Updates" button in the selected goal        Complete a1c at next office visit       COMPLETED: Complete lipid panel at next office visit       Should consider a stronger statin medication if LDL >70      LDL goal less than 70   On track    COMPLETED: Take simvastatin in the evening versus in the morning       Simvastatin is one of the cholesterol medications that work better when taken at night.  Take after dinner instead of taking during the day.  Stronger (high intensity) cholesterol medications can be taken during the day.  We may need to consider this if taking medication during the day is easier         Patient verbalizes understanding of instructions provided today and agrees to view in MyChart.   The care management team will reach out to the patient again over the next 14 to 21 days.  Follow up with clinical pharmacist in 3 months  Henrene Pastor, PharmD Clinical Pharmacist [facility] [phone]

## 2020-10-03 NOTE — Chronic Care Management (AMB) (Signed)
Chronic Care Management Pharmacy Note  10/03/2020 Name:  [name] MRN:  [id number] DOB:  October 03, 1950  Subjective: [name] is an 70 y.o. year old female who is a primary patient of Copland, Gay Filler, MD.  The CCM team was consulted for assistance with disease management and care coordination needs.    Engaged with patient by telephone for follow up visit in response to provider referral for pharmacy case management and/or care coordination services.   Consent to Services:  The patient was given information about Chronic Care Management services, agreed to services, and gave verbal consent prior to initiation of services.  Please see initial visit note for detailed documentation.   Patient Care Team: Copland, Gay Filler, MD as PCP - General (Family Medicine) Debara Pickett Nadean Corwin, MD as PCP - Cardiology (Cardiology) [name], PharmD (Pharmacist)  Recent office visits: 06/19/2020 - PCP (Dr Lorelei Pont) F/U chronic conditions. No med changes.   Recent consult visits: 08/18/2020 - Cardio (Dr Debara Pickett) [place] message - started fenofibrate 163m daily due to elevated Tg.  05/17/2020 - Cardio (Dr HDebara Pickett Hyperlipidemia; Unable to tolerate rosuvastaitn - myalgias. Taking simvastatin 436mdaily. Added ezetimibe 1029maily  Hospital visits: None in previous 6 months  Objective:  Lab Results  Component Value Date   CREATININE 0.82 06/19/2020   CREATININE 0.81 11/18/2019   CREATININE 0.75 07/01/2019    Lab Results  Component Value Date   HGBA1C 7.2 (H) 06/19/2020   Last diabetic Eye exam:  Lab Results  Component Value Date/Time   HMDIABEYEEXA No Retinopathy 07/27/2015 09:06 AM    Last diabetic Foot exam: No results found for: HMDIABFOOTEX      Component Value Date/Time   CHOL 169 08/17/2020 1405   TRIG 551 (HH) 08/17/2020 1405   HDL 29 (L) 08/17/2020 1405   CHOLHDL 5.8 (H) 08/17/2020 1405   CHOLHDL 6.3 (H) 12/20/2019 0945   VLDL 30.8 07/01/2019 0921   LDLCALC 57  08/17/2020 1405   LDLCALC  12/20/2019 0945     Comment:     . LDL cholesterol not calculated. Triglyceride levels greater than 400 mg/dL invalidate calculated LDL results. . Reference range: <100 . Desirable range <100 mg/dL for primary prevention;   <70 mg/dL for patients with CHD or diabetic patients  with > or = 2 CHD risk factors. . LMarland KitchenL-C is now calculated using the Martin-Hopkins  calculation, which is a validated novel method providing  better accuracy than the Friedewald equation in the  estimation of LDL-C.  MarCresenciano Genre al. JAMAnnamaria [id number];[id number]  (http://education.[facility].com/faq/FAQ164)    LDLDIRECT 82.0 11/16/2018 1010    Hepatic Function Latest Ref Rng & Units 05/17/2020 12/20/2019 07/01/2019  Total Protein 6.0 - 8.5 g/dL 6.6 6.7 6.3  Albumin 3.8 - 4.8 g/dL 4.2 - 4.0  AST 0 - 40 IU/L _0 ALT 0 - 32 IU/L _1 Alk Phosphatase 44 - 121 IU/L 109 - 87  Total Bilirubin 0.0 - 1.2 mg/dL 0.3 0.5 0.5  Bilirubin, Direct 0.00 - 0.40 mg/dL <0.10 0.1 -    Lab Results  Component Value Date/Time   TSH 2.35 07/01/2019 09:21 AM   TSH 3.11 01/25/2016 09:49 AM    CBC Latest Ref Rng & Units 06/19/2020 11/18/2019 07/01/2019  WBC 4.0 - 10.5 K/uL 9.4 12.1(H) 9.6  Hemoglobin 12.0 - 15.0 g/dL 12.0 12.1 12.0  Hematocrit 36.0 - 46.0 % 37.4 39.5 37.6  Platelets 150.0 - 400.0 K/uL 292.0 337 328.0  Lab Results  Component Value Date/Time   VD25OH 39.17 07/18/2016 08:53 AM    Clinical ASCVD: Yes  The 10-year ASCVD risk score Mikey Bussing DC Jr., et al., 2013) is: 22.5%   Values used to calculate the score:     Age: 31 years     Sex: Female     Is Non-Hispanic African American: No     Diabetic: Yes     Tobacco smoker: No     Systolic Blood Pressure: 341 mmHg     Is BP treated: Yes     HDL Cholesterol: 29 mg/dL     Total Cholesterol: 169 mg/dL     Social History   Tobacco Use  Smoking Status Never  Smokeless Tobacco Never   BP Readings from Last 3  Encounters:  06/19/20 122/60  05/17/20 (!) 136/52  04/04/20 138/82   Pulse Readings from Last 3 Encounters:  06/19/20 (!) 58  04/04/20 61  04/03/20 (!) 59   Wt Readings from Last 3 Encounters:  06/19/20 206 lb 4.8 oz (93.6 kg)  05/17/20 206 lb (93.4 kg)  04/04/20 207 lb 4 oz (94 kg)    Assessment: Review of patient past medical history, allergies, medications, health status, including review of consultants reports, laboratory and other test data, was performed as part of comprehensive evaluation and provision of chronic care management services.   SDOH:  (Social Determinants of Health) assessments and interventions performed:    CCM Care Plan  Allergies  Allergen Reactions   Crestor [Rosuvastatin] Other (See Comments)    myalgia   Niacin And Related Other (See Comments)    Whelps and skin flushed, mouth tingling    Medications Reviewed Today     Reviewed by [name], PharmD (Pharmacist) on 09/28/20 at 1035  Med List Status: <None>   Medication Order Taking? Sig Documenting Provider Last Dose Status Informant  alendronate (FOSAMAX) 70 MG tablet [id number] Yes TAKE 1 TABLET BY MOUTH EVERY 7 (SEVEN) DAYS. TAKE WITH A FULL GLASS OF WATER ON AN EMPTY STOMACH. Copland, Gay Filler, MD Taking Active   aspirin EC 81 MG EC tablet [id number] Yes Take 1 tablet (81 mg total) by mouth daily. Rod Can, MD Taking Active Self  Calcium Carbonate 260 MG CHEW [id number] Yes Chew 1 tablet by mouth daily. [provider] Taking Active Self  cetirizine (ZYRTEC) 10 MG chewable tablet [id number] Yes Chew 10 mg by mouth daily as needed for allergies. [provider] Taking Active Self  cholecalciferol (VITAMIN D) 1000 UNITS tablet [id number] Yes Take 1,000 Units by mouth daily. [provider] Taking Active Self  clopidogrel (PLAVIX) 75 MG tablet [id number] Yes TAKE 1 TABLET BY MOUTH DAILY. Pixie Casino, MD Taking Active   cyclobenzaprine (FLEXERIL) 10 MG tablet  [id number] No TAKE 1 TABLET BY MOUTH TWICE DAILY AS NEEDED FOR MUSCLE SPASMS  Patient not taking: Reported on 09/28/2020   Copland, Gay Filler, MD Not Taking Active            Med Note Gentry Roch   Tue Dec 07, 2019  8:58 AM)    diazepam (VALIUM) 2 MG tablet [id number] Yes TAKE 1 TABLET BY MOUTH EVERY 8 HOURS AS NEEDED FOR MUSCLE SPASMS Copland, Gay Filler, MD Taking Active   diclofenac sodium (VOLTAREN) 1 % GEL [id number] Yes Apply 2 g topically 4 (four) times daily. Geradine Girt, DO Taking Active   ezetimibe (ZETIA) 10 MG tablet [id number] Yes TAKE 1 TABLET BY MOUTH ONCE A DAY Hilty, Chrissie Noa  C, MD Taking Active   famotidine (PEPCID) 20 MG tablet [id number] Yes TAKE 1 TABLET BY MOUTH 2 TIMES DAILY.  Patient taking differently: Take 20 mg by mouth daily as needed.   Pixie Casino, MD Taking Active   fenofibrate (TRICOR) 145 MG tablet [id number] Yes Take 1 tablet (145 mg total) by mouth daily. Pixie Casino, MD Taking Active   FLUoxetine (PROZAC) 20 MG capsule [id number] Yes TAKE 1 CAPSULE BY MOUTH ONCE DAILY Copland, Gay Filler, MD Taking Active   fluticasone (FLONASE) 50 MCG/ACT nasal spray [id number] Yes Place 1 spray into both nostrils daily as needed for allergies or rhinitis. [provider] Taking Active Self  icosapent Ethyl (VASCEPA) 1 g capsule [id number] Yes TAKE 2 CAPSULES BY MOUTH TWICE DAILY  Patient taking differently: Take 1 g by mouth 2 (two) times daily.   Copland, Gay Filler, MD Taking Active   metFORMIN (GLUCOPHAGE-XR) 500 MG 24 hr tablet [id number] Yes Take 1 tablet (500 mg total) by mouth daily with breakfast. Copland, Gay Filler, MD Taking Active   metoprolol succinate (TOPROL-XL) 25 MG 24 hr tablet [id number] Yes TAKE 1/2 OF A TABLET BY MOUTH DAILY. Copland, Gay Filler, MD Taking Active   Multiple Vitamin (MULTIVITAMIN WITH MINERALS) TABS tablet [id number] Yes Take 1 tablet by mouth daily. [provider] Taking Active Self  nitroGLYCERIN (NITROSTAT) 0.4 MG SL  tablet [id number] Yes Place 1 tablet (0.4 mg total) under the tongue every 5 (five) minutes as needed for chest pain. Pixie Casino, MD Taking Active     Discontinued 02/27/20 2121 (Side effect (s))   simvastatin (ZOCOR) 40 MG tablet [id number] Yes TAKE 1 TABLET BY MOUTH DAILY. Copland, Gay Filler, MD Taking Active   sodium chloride flush (NS) 0.9 % injection 3 mL [id number]   Deberah Pelton, NP  Active   telmisartan (MICARDIS) 40 MG tablet [id number] Yes TAKE 1 TABLET BY MOUTH EVERY MORNING Hilty, Nadean Corwin, MD Taking Active   telmisartan (MICARDIS) 40 MG tablet [id number] Yes TAKE 1 TABLET BY MOUTH EVERY MORNING * NEEDS OFFICE VISIT Pixie Casino, MD Taking Active             Patient Active Problem List   Diagnosis Date Noted   Obstructive sleep apnea treated with continuous positive airway pressure (CPAP) 05/07/2018   Chest pain 01/31/2018   Morbid obesity ([place]) 11/03/2017   Coronary artery disease involving native coronary artery of native heart with unstable angina pectoris ([place]) 11/03/2017   Insomnia 11/03/2017   Inadequate sleep hygiene 11/03/2017   Anxiety 07/21/2017   Dizziness 10/02/2016   Right patella fracture 08/29/2016   Acquired foot deformity, left 07/18/2016   Osteopenia 11/10/2015   HNP (herniated nucleus pulposus), lumbar 10/12/2014   Spinal stenosis at L4-L5 level 10/12/2014   Iron deficiency anemia 07/29/2014   DM (diabetes mellitus) with complications ([place]) 52/10/221   Environmental and seasonal allergies 04/28/2014   Spinal stenosis, lumbar region, with neurogenic claudication 01/06/2013   Obesity, morbid, BMI 40.0-49.9 ([place]) 10/20/2012   Chest pain with moderate risk of acute coronary syndrome 10/02/2012   CAD S/P percutaneous coronary angioplasty    Hyperlipidemia with target LDL less than 70    Essential hypertension 04/16/2012    Immunization History  Administered Date(s) Administered   Fluad Quad(high Dose 65+) 11/16/2018, 12/20/2019    Influenza Split 01/11/2016   Influenza,inj,Quad PF,6+ Mos 12/01/2013, 01/18/2015   Influenza-Unspecified 01/23/2018   PFIZER Comirnaty(Gray Top)Covid-19 Tri-Sucrose Vaccine 07/27/2020   PFIZER(Purple Top)SARS-COV-2 Vaccination 04/20/2019,  05/13/2019, 12/21/2019   Pneumococcal Conjugate-13 01/18/2015   Pneumococcal Polysaccharide-23 07/12/2010, 04/21/2017   Td 11/16/2018   Tdap 09/15/2008    Conditions to be addressed/monitored: CAD, HTN, HLD, Hypertriglyceridemia, DMII, Anxiety, and obesity  Care Plan : General Pharmacy (Adult)  Updates made by [name], PHARMD since 10/03/2020 12:00 AM     Problem: CHL AMB "PATIENT-SPECIFIC PROBLEM"      Long-Range Goal: Pharmacy Care plan for chronic conditions and medication management   Start Date: 09/27/2020  Priority: High  Note:   Current Barriers:  Unable to achieve control of hyperlipidemia  Experiencing myalgias possibly related to simvastatin therapy  Pharmacist Clinical Goal(s):  Over the next 90 days, patient will achieve adherence to monitoring guidelines and medication adherence to achieve therapeutic efficacy achieve control of hyperlipidemia as evidenced by LDL <70 maintain control of HTN and type 2 DM as evidenced by BP <140/90 and A1c < 7.2%  through collaboration with PharmD and provider.   Interventions: 1:1 collaboration with Copland, Gay Filler, MD regarding development and update of comprehensive plan of care as evidenced by provider attestation and co-signature Inter-disciplinary care team collaboration (see longitudinal plan of care) Comprehensive medication review performed; medication list updated in electronic medical record  Hypertension Controlled; BP goal <130/80 Current regimen:  Telmisartan 109m daily Metoprolol succinate 255m1/2 tab daily Not checking BP at home regularly Denies dizziness or symptoms of hypotension Interventions: Requested patient to check blood pressure 3 to 4  times per week and  record. Provide at future appointments Ensure daily salt intake < 2300 mg/day Continue to take above hypertensive medications  Hyperlipidemia / CAD:  Lipid Panel     Component Value Date/Time   CHOL 169 08/17/2020 1405   TRIG 551 (HH) 08/17/2020 1405   HDL 29 (L) 08/17/2020 1405   CHOLHDL 5.8 (H) 08/17/2020 1405   CHOLHDL 6.3 (H) 12/20/2019 0945   VLDL 30.8 07/01/2019 0921   LDLCALC 57 08/17/2020 1405   LDLCALC  12/20/2019 0945   LDLDIRECT 82.0 11/16/2018 1010   LABVLDL 83 (H) 08/17/2020 1405  Not at goals;  LDL goal < 70 and Tg <150 Managed by Dr HiDebara Pickett cardio Current regimen:  Simvastatin 4064maily  Vascepa 1g - take 1 capsule twice a day Ezetimibe 63m62mily  Fenofibrate 145mg57mly (started 08/18/2020) Clopidogrel 75mg 51my  Past medications: rosuvastatin (myalgias)  Patient reports increase in hip and leg pain / myalgias since starting fenofibrate. Could be simvastatin or combo of statin + fenofibrate Interventions: Encouraged adherence to lipid lowering therapy Coordinated with Dr Hilty Debara Pickettding recent increase in muscle pain possibly related to simvastatin and suggested consider trial of pravastatin 40 or 80mg. 52milty rDebara Pickettended patient hold simvastatin for 2 weeks to see if myalgias / pain resolves.  Continue other lipid lowering medications listed above.  Continue to follow up with Dr Hilty. Debara Pickettbetes Lab Results  Component Value Date/Time   HGBA1C 7.2 (H) 06/19/2020 08:54 AM   HGBA1C 7.0 (H) 12/20/2019 09:45 AM  Not at goal with recent increase in A1c;  A1c goal <7% Current regimen:  Metformin ER 500mg da50mInterventions: Discussed importance of diet and exercise Discussed possibly therapies for diabetes that might also help with weight loss - Ozempic, Trulicity, Jardinace.  Recommend check blood sugar once daily, document, and provide at future appointments Contact provider with any episodes of hypoglycemia Continue current regimen for diabetes.    Medication management [facility] Interventions Comprehensive medication review performed. Continue  current medication management strategy  Depression/Anxiety Controlled Current treatment: Fluoxotine 78m daily  Patient mentions wanting to possibly stop fluoxetine in future. Interventions:  Discussed that if she decides to stop fluoxetine, she should taper dose and discuss with PCP.  Continue fluoxetine  Patient Goals/Self-Care Activities Over the next 90 days, patient will:  take medications as prescribed, check blood pressure 3 to 4 times per week, document, and provide at future appointments, and trial off simvastatin for 2 weeks to see if myalgias improved.   Follow Up Plan: The care management team will reach out to the patient again over the next 14 to 21 days. Clinical pharmacist visit in 3 months.        Medication Assistance: None required.  Patient affirms current coverage meets needs.  [facility]  [id number]: [phone]: [phone] [address] [place]Phone: [phone]: [phone] Follow Up:  Patient agrees to Care Plan and Follow-up.  Plan: The care management team will reach out to the patient again over the next 14 to 21 days. and f/u with clinical pharmacist in 3 months unless needed sooner.   TCherre Robins PharmD Clinical Pharmacist [phone]

## 2020-10-12 DIAGNOSIS — I252 Old myocardial infarction: Secondary | ICD-10-CM | POA: Diagnosis not present

## 2020-10-12 DIAGNOSIS — I519 Heart disease, unspecified: Secondary | ICD-10-CM | POA: Diagnosis not present

## 2020-10-12 DIAGNOSIS — R0789 Other chest pain: Secondary | ICD-10-CM | POA: Diagnosis not present

## 2020-10-12 DIAGNOSIS — I4510 Unspecified right bundle-branch block: Secondary | ICD-10-CM | POA: Diagnosis not present

## 2020-10-12 DIAGNOSIS — R079 Chest pain, unspecified: Secondary | ICD-10-CM | POA: Diagnosis not present

## 2020-10-13 ENCOUNTER — Telehealth: Payer: Self-pay | Admitting: Internal Medicine

## 2020-10-13 NOTE — Telephone Encounter (Signed)
    Pt c/o of Chest Pain: STAT if CP now or developed within 24 hours  1. Are you having CP right now? Yes, dull ache  2. Are you experiencing any other symptoms (ex. SOB, nausea, vomiting, sweating)? Really bad headache  3. How long have you been experiencing CP? Last night  4. Is your CP continuous or coming and going? Continuous   5. Have you taken Nitroglycerin? Took 3 while in the ED  Pt last night had chest pain and was brought to the ED, right now pt can still feel a dull ache pain on her chest with a really bad headache. Pt is out of town but was the told to call us regarding CP ?

## 2020-10-13 NOTE — Telephone Encounter (Signed)
Lm to call back ./cy 

## 2020-10-13 NOTE — Telephone Encounter (Signed)
STAT Call into triage with patient calling for chest pain. Patient was in the ED in [facility] last night with Chest pain. Stated that her Trop. Were stable and that they did give her 3 nitro during her visit. They told her to call our office to schedule with Dr. Rennis Golden.  Spoke with pt she was having 6 out of 10 chest pain currently. Stated the last night helped but it didn't go away. While on phone she took one more NItro, that took pain to a 5 and developed indigestion, jaw, and back pain. Told to call 911 and go back to the ED. Spoke with pt daughter [name], there at house with her, and also instructed to take 1 ASA and let the EMS know that she has taken Nitro and ASA before their arrival. Verbalized understanding

## 2020-10-13 NOTE — Telephone Encounter (Signed)
Pt called back after evaluation by EMS, noticed BBB, gave her the ASA. She started to feel better by the time they got there. She stated the medication the ED started her on was Imdur 30 mg Qdaily. Pt stated she will start taking it now that she has it. Pt schedule for appt with C.Walker on 7/25 at 11:30a.pt and daughter agreed with plan no additional questions at this time.

## 2020-10-18 ENCOUNTER — Other Ambulatory Visit: Payer: Self-pay

## 2020-10-18 ENCOUNTER — Encounter (HOSPITAL_BASED_OUTPATIENT_CLINIC_OR_DEPARTMENT_OTHER): Payer: Self-pay | Admitting: Family

## 2020-10-18 ENCOUNTER — Telehealth (HOSPITAL_COMMUNITY): Payer: Self-pay

## 2020-10-18 ENCOUNTER — Ambulatory Visit (HOSPITAL_BASED_OUTPATIENT_CLINIC_OR_DEPARTMENT_OTHER): Payer: PPO | Admitting: Family

## 2020-10-18 ENCOUNTER — Other Ambulatory Visit (HOSPITAL_COMMUNITY): Payer: Self-pay

## 2020-10-18 ENCOUNTER — Encounter (HOSPITAL_BASED_OUTPATIENT_CLINIC_OR_DEPARTMENT_OTHER): Payer: Self-pay | Admitting: Internal Medicine

## 2020-10-18 VITALS — BP 134/64 | HR 61 | Ht 61.0 in | Wt 205.2 lb

## 2020-10-18 DIAGNOSIS — E782 Mixed hyperlipidemia: Secondary | ICD-10-CM

## 2020-10-18 DIAGNOSIS — R079 Chest pain, unspecified: Secondary | ICD-10-CM

## 2020-10-18 DIAGNOSIS — F419 Anxiety disorder, unspecified: Secondary | ICD-10-CM

## 2020-10-18 DIAGNOSIS — I10 Essential (primary) hypertension: Secondary | ICD-10-CM | POA: Diagnosis not present

## 2020-10-18 DIAGNOSIS — I25118 Atherosclerotic heart disease of native coronary artery with other forms of angina pectoris: Secondary | ICD-10-CM

## 2020-10-18 DIAGNOSIS — I4510 Unspecified right bundle-branch block: Secondary | ICD-10-CM

## 2020-10-18 MED ORDER — ISOSORBIDE MONONITRATE ER 30 MG PO TB24
30.0000 mg | ORAL_TABLET | Freq: Every day | ORAL | 1 refills | Status: DC
  Filled 2020-10-18: qty 90, 90d supply, fill #0
  Filled 2021-01-08: qty 90, 90d supply, fill #1

## 2020-10-18 NOTE — Patient Instructions (Addendum)
Medication Instructions:   CONTINUE Isosorbide Mononitrate (Imdur) one 30mg  tablet daily. We sent a refill to the pharmacy today.   RESUME your Simvastatin  *If you need a refill on your cardiac medications before your next appointment, please call your pharmacy*   Lab Work: None ordered today.   Testing/Procedures: Your EKG today showed normal sinus rhythm with a right bundle branch block which is a stable finding.   Your physician has requested that you have a lexiscan myoview. Please follow instruction sheet, as given.    Follow-Up: At [facility], you and your health needs are our priority.  As part of our continuing mission to provide you with exceptional heart care, we have created designated Provider Care Teams.  These Care Teams include your primary Cardiologist (physician) and Advanced Practice Providers (APPs -  Physician Assistants and Nurse Practitioners) who all work together to provide you with the care you need, when you need it.  We recommend signing up for the patient portal called "MyChart".  Sign up information is provided on this After Visit Summary.  MyChart is used to connect with patients for Virtual Visits (Telemedicine).  Patients are able to view lab/test results, encounter notes, upcoming appointments, etc.  Non-urgent messages can be sent to your provider as well.   To learn more about what you can do with MyChart, go to [facility].    Your next appointment:   6 weeks  The format for your next appointment:   In Person  Provider:   You may see [email or web address], MD or one of the following Advanced Practice Providers on your designated Care Team:   Chrystie Nose, PA-C Azalee Course, PA-C or  Micah Flesher, Judy Pimple   Other Instructions  Heart Healthy Diet Recommendations: A low-salt diet is recommended. Meats should be grilled, baked, or boiled. Avoid fried foods. Focus on lean protein sources like fish or chicken with vegetables and fruits. The  American Heart Association is a [place]!    Exercise recommendations: The American Heart Association recommends 150 minutes of moderate intensity exercise weekly. Try 30 minutes of moderate intensity exercise 4-5 times per week. This could include walking, jogging, or swimming.   How to prepare for your Myocardial Perfusion Test: Do not eat or drink 3 hours prior to your test, except you may have water. Do not consume products containing caffeine (regular or decaffeinated) 12 hours prior to your test. (ex: coffee, chocolate, sodas, tea). Do bring a list of your current medications with you.  If not listed below, you may take your medications as normal.  HOLD your Metoprolol the day of your stress test Do wear comfortable clothes (no dresses or overalls) and walking shoes, tennis shoes preferred (No heels or open toe shoes are allowed). Do NOT wear cologne, perfume, aftershave, or lotions (deodorant is allowed). If these instructions are not followed, your test will have to be rescheduled.  Please report to [address] for your test.  If you have questions or concerns about your appointment, you can call the Nuclear Lab at [phone].  If you cannot keep your appointment, please provide 24 hours notification to the Nuclear Lab, to avoid a possible $50 charge to your account.

## 2020-10-18 NOTE — Progress Notes (Addendum)
Office Visit    Patient Name: [name] PeonHelen J Anello Date of Encounter: 10/18/2020  PCP:  Pearline Cablesopland, Jessica C, MD   [facility]  Cardiologist:  Chrystie NoseKenneth C Hilty, MD  Advanced Practice Provider:  No care team member to display Electrophysiologist:  None   Chief Complaint    [name] is a 70 y.o. female with a hx of CAD, HTN, anxiety, HLD presents today for follow up after ED visit for chest pain   Past Medical History    Past Medical History:  Diagnosis Date   Anemia    Arthritis    Bronchitis    hx of    CAD (coronary artery disease)    a. s/p DES to LAD in 2010 with patent stent by cath in 2014 b. low-risk NST in 11/2016   Cataracts, bilateral    Diabetes mellitus without complication (HCC)    Family history of adverse reaction to anesthesia    pts mother had difficulty awakening    GERD (gastroesophageal reflux disease)    Hyperlipidemia LDL goal < 70    Hypertension    Lumbar back pain    Numbness    left leg and foot    Plantar fascia rupture    Left Foot   Sleep apnea    uses CPAP    Past Surgical History:  Procedure Laterality Date   ABDOMINAL HYSTERECTOMY     BACK SURGERY     BREAST CYST ASPIRATION  1995   CAROTID STENT  2009   pt denies    CORONARY ANGIOPLASTY WITH STENT PLACEMENT  2010and 10-02-2012   Stent DES, Xience to prox. LAD   DOPPLER ECHOCARDIOGRAPHY  08/01/2009   EF=>55%,LV normal   LEFT HEART CATH AND CORONARY ANGIOGRAPHY N/A 12/10/2019   Procedure: LEFT HEART CATH AND CORONARY ANGIOGRAPHY;  Surgeon: Lyn RecordsSmith, Henry W, MD;  Location: [facility];  Service: Cardiovascular;  Laterality: N/A;   LEFT HEART CATHETERIZATION WITH CORONARY ANGIOGRAM N/A 10/02/2012   Procedure: LEFT HEART CATHETERIZATION WITH CORONARY ANGIOGRAM;  Surgeon: Runell GessJonathan J Berry, MD;  [facility];  Service: Cardiovascular;  Laterality: N/A;   lower arterial duplex  06/20/10   abi's normal,rgt 0.98,lft 1.06;bilateral PVRs normal   LUMBAR  LAMINECTOMY/DECOMPRESSION MICRODISCECTOMY Left 01/06/2013   Procedure: MICRO LUMBAR DECOMPRESSION L4-5 AND L5-S1;  Surgeon: Javier DockerJeffrey C Beane, MD;  Location: [facility];  Service: Orthopedics;  Laterality: Left;   LUMBAR LAMINECTOMY/DECOMPRESSION MICRODISCECTOMY Left 10/12/2014   Procedure: REVISION MICRO LUMBAR/DECOMPRESSION L4-5 LEFT ;  Surgeon: Jene EveryJeffrey Beane, MD;  Location: [facility];  Service: Orthopedics;  Laterality: Left;   NM MYOCAR PERF WALL MOTION  09/22/2008   lexiscan-EF 83%; glogal LV systolic fx is norm. ,evidence of mild ischemia basal anterior,midanterior and apical lateral region(s).    ORIF PATELLA Right 08/29/2016   Procedure: OPEN REDUCTION INTERNAL (ORIF) FIXATION RIGHT PATELLA;  Surgeon: Samson FredericSwinteck, Brian, MD;  Location: [facility];  Service: Orthopedics;  Laterality: Right;  Adductor Block   TUBAL LIGATION     TYMPANOPLASTY Bilateral    UVULOPALATOPHARYNGOPLASTY     pt denies     Allergies  Allergies  Allergen Reactions   Crestor [Rosuvastatin] Other (See Comments)    myalgia   Niacin And Related Other (See Comments)    Whelps and skin flushed, mouth tingling    History of Present Illness    [name] PeonHelen J Mesquita is a 70 y.o. female with a hx of CAD (DES to pLAD 2010 with residual  disease in LCx 10-20% and RCA 20% lesion  cath 11/2019 25% ISR and otherwise mild nonobstructive disease), HTN, DM2, DLD, obesity last seen 05/17/20 by Dr. Rennis Golden.  She was seen in the ED out of town due to chest pain. [place] She did do significant work unloading the car and kitchen day prior to chest pain. They were eating when all of the sudden she had chest pain. Evaluated in ED with troponin negative x1. The next day she had chest pain but none since that time. She was started on Imdur in the ED and notes improvement.   Presents today for follow up with her husband. Reports no shortness of breath and mild dyspnea on exertion. Chest pain improving with Imdur. No headache. No  edema, orthopnea, PND. Reports no palpitations.    EKGs/Labs/Other Studies Reviewed:   The following studies were reviewed today:  Echo 12/13/19  1. Left ventricular ejection fraction, by estimation, is 60 to 65%. The  left ventricle has normal function. The left ventricle has no regional  wall motion abnormalities. There is moderate asymmetric left ventricular  hypertrophy of the basal-septal  segment. Left ventricular diastolic parameters are indeterminate.   2. Right ventricular systolic function is normal. The right ventricular  size is normal. There is normal pulmonary artery systolic pressure.   3. The mitral valve is normal in structure. Trivial mitral valve  regurgitation. No evidence of mitral stenosis.   4. The aortic valve is grossly normal. There is mild calcification of the  aortic valve. There is mild thickening of the aortic valve. Aortic valve  regurgitation is not visualized. No aortic stenosis is present.   5. The inferior vena cava is normal in size with greater than 50%  respiratory variability, suggesting right atrial pressure of 3 mmHg.   LHC 12/10/19 Widely patent coronary arteries including the LAD stent. Myocardial ischemia with no obstructive coronary disease (INOCA). Chronic diastolic heart failure, LVEDP greater than 22 mmHg.  EF 65%.   RECOMMENDATIONS:   Sublingual nitroglycerin for episodes of chest pain of prolonged. Consider adding calcium channel blocker therapy as the first medication to treat micro circulatory dysfunction. Therapy for chronic diastolic heart failure, consider SGLT2 if not already being used.  EKG:  EKG is ordered today.  The ekg ordered today demonstrates NSR 61 bpm with stable RBBB and no acute ST/T wave changes.   Recent Labs: 05/17/2020: ALT 14 06/19/2020: BUN 16; Creatinine, Ser 0.82; Hemoglobin 12.0; Platelets 292.0; Potassium 4.3; Sodium 139  Recent Lipid Panel    Component Value Date/Time   CHOL 169 08/17/2020 1405    TRIG 551 (HH) 08/17/2020 1405   HDL 29 (L) 08/17/2020 1405   CHOLHDL 5.8 (H) 08/17/2020 1405   CHOLHDL 6.3 (H) 12/20/2019 0945   VLDL 30.8 07/01/2019 0921   LDLCALC 57 08/17/2020 1405   LDLCALC  12/20/2019 0945     Comment:     . LDL cholesterol not calculated. Triglyceride levels greater than 400 mg/dL invalidate calculated LDL results. . Reference range: <100 . Desirable range <100 mg/dL for primary prevention;   <70 mg/dL for patients with CHD or diabetic patients  with > or = 2 CHD risk factors. Marland Kitchen LDL-C is now calculated using the Martin-Hopkins  calculation, which is a validated novel method providing  better accuracy than the Friedewald equation in the  estimation of LDL-C.  Horald Pollen et al. Lenox Ahr. 7564;332(95): 2061-2068  (http://education.[employer].com/faq/FAQ164)    LDLDIRECT 82.0 11/16/2018 1010  Home Medications   Current Meds  Medication Sig   alendronate (FOSAMAX) 70 MG tablet TAKE 1 TABLET BY MOUTH EVERY 7 (SEVEN) DAYS. TAKE WITH A FULL GLASS OF WATER ON AN EMPTY STOMACH.   aspirin EC 81 MG EC tablet Take 1 tablet (81 mg total) by mouth daily.   Calcium Carbonate 260 MG CHEW Chew 1 tablet by mouth daily.   cetirizine (ZYRTEC) 10 MG chewable tablet Chew 10 mg by mouth daily as needed for allergies.   cholecalciferol (VITAMIN D) 1000 UNITS tablet Take 1,000 Units by mouth daily.   clopidogrel (PLAVIX) 75 MG tablet TAKE 1 TABLET BY MOUTH DAILY.   cyclobenzaprine (FLEXERIL) 10 MG tablet TAKE 1 TABLET BY MOUTH TWICE DAILY AS NEEDED FOR MUSCLE SPASMS   diazepam (VALIUM) 2 MG tablet TAKE 1 TABLET BY MOUTH EVERY 8 HOURS AS NEEDED FOR MUSCLE SPASMS   ezetimibe (ZETIA) 10 MG tablet TAKE 1 TABLET BY MOUTH ONCE A DAY   fenofibrate (TRICOR) 145 MG tablet Take 1 tablet (145 mg total) by mouth daily.   FLUoxetine (PROZAC) 20 MG capsule TAKE 1 CAPSULE BY MOUTH ONCE DAILY   fluticasone (FLONASE) 50 MCG/ACT nasal spray Place 1 spray into both nostrils daily as needed for  allergies or rhinitis.   icosapent Ethyl (VASCEPA) 1 g capsule TAKE 2 CAPSULES BY MOUTH TWICE DAILY (Patient taking differently: Take 1 g by mouth 2 (two) times daily.)   isosorbide mononitrate (IMDUR) 30 MG 24 hr tablet Take 30 mg by mouth daily.   metFORMIN (GLUCOPHAGE-XR) 500 MG 24 hr tablet Take 1 tablet (500 mg total) by mouth daily with breakfast.   metoprolol succinate (TOPROL-XL) 25 MG 24 hr tablet TAKE 1/2 OF A TABLET BY MOUTH DAILY.   Multiple Vitamin (MULTIVITAMIN WITH MINERALS) TABS tablet Take 1 tablet by mouth daily.   nitroGLYCERIN (NITROSTAT) 0.4 MG SL tablet Place 1 tablet (0.4 mg total) under the tongue every 5 (five) minutes as needed for chest pain.   simvastatin (ZOCOR) 40 MG tablet TAKE 1 TABLET BY MOUTH DAILY.   telmisartan (MICARDIS) 40 MG tablet TAKE 1 TABLET BY MOUTH EVERY MORNING * NEEDS OFFICE VISIT   Current Facility-Administered Medications for the 10/18/20 encounter (Office Visit) with Alver Sorrow, NP  Medication   sodium chloride flush (NS) 0.9 % injection 3 mL     Review of Systems      All other systems reviewed and are otherwise negative except as noted above.  Physical Exam    VS:  BP 134/64   Pulse 61   Ht 5\' 1"  (1.549 m)   Wt 205 lb 3.2 oz (93.1 kg)   LMP  (LMP Unknown)   SpO2 94%   BMI 38.77 kg/m  , BMI Body mass index is 38.77 kg/m.  Wt Readings from Last 3 Encounters:  10/18/20 205 lb 3.2 oz (93.1 kg)  06/19/20 206 lb 4.8 oz (93.6 kg)  05/17/20 206 lb (93.4 kg)     GEN: Well nourished, well developed, in no acute distress. HEENT: normal. Neck: Supple, no JVD, carotid bruits, or masses. Cardiac: RRR, no murmurs, rubs, or gallops. No clubbing, cyanosis, edema.  Radials/PT 2+ and equal bilaterally.  Respiratory:  Respirations regular and unlabored, clear to auscultation bilaterally. GI: Soft, nontender, nondistended. MS: No deformity or atrophy. Skin: Warm and dry, no rash. Neuro:  Strength and sensation are intact. Psych:  Normal affect.  Assessment & Plan    Chest pain / Mild nonobstructive heart disease - ED visit  for chest pain which occurred at rest. Likely musculoskeletal as she had lifted luggage the day before. Tolerating addition of Imdur 30mg  daily, refill provided. EKG today no acute St/T wave changes. LHC 11/2019 with mild nonobstructive disease and mild ISR. Low suspicion for significant ischemia as chest pain atypical as it occurred at rest but will plan for Lexiscan Myoview to ensure no worsening CAD. Shared Decision Making/Informed Consent The risks [chest pain, shortness of breath, cardiac arrhythmias, dizziness, blood pressure fluctuations, myocardial infarction, stroke/transient ischemic attack, nausea, vomiting, allergic reaction, radiation exposure, metallic taste sensation and life-threatening complications (estimated to be 1 in 10,000)], benefits (risk stratification, diagnosing coronary artery disease, treatment guidance) and alternatives of a nuclear stress test were discussed in detail with Ms. Turk and she agrees to proceed.   RBBB - Stable finding by EKG. Continue to monitor with periodic EKG. No near syncope nor syncope.  HLD - Continue Fenofibrate, Zetia, Simvastatin. She did trial two week statin holiday with no improvement in muscle pain with no improvement. She will resume statin and discuss muscle pains with her primary care provider.   DM2 - Continue to follow with primary care. Consider SGLT2i if additional agent needed.  HTN - BP well controlled. Continue current antihypertensive regimen.    Anxiety - Likely contributory to chest pain. Continue to follow with PCP.   Disposition: Follow up in 6 week(s) with Dr. 12/2019 or APP.  Signed, Rennis Golden, NP 10/18/2020, 11:30 AM [facility]

## 2020-10-18 NOTE — Telephone Encounter (Signed)
Spoke with the patient, detailed instructions given. She stated that she would be here for her test. Asked to call back with any questions. S.Vertis Scheib EMTP 

## 2020-10-19 NOTE — Addendum Note (Signed)
Addended by: Alver Sorrow on: 10/19/2020 08:33 AM   Modules accepted: Orders

## 2020-10-23 ENCOUNTER — Telehealth: Payer: Self-pay | Admitting: Pharmacist

## 2020-10-23 NOTE — Chronic Care Management (AMB) (Signed)
Chronic Care Management Pharmacy Assistant   Name: [name]  MRN: [id number] DOB: [birth year]  Reason for Encounter: Disease State General    Recent office visits:  None noted  Recent consult visits:  10/18/20-Caitlin S. Dan Humphreys, NP (Cardiology) Hospital follow up. Restart on Simvastatin. Follow up in 6 weeks.  Hospital visits:  None in previous 6 months  Medications: Outpatient Encounter Medications as of 10/23/2020  Medication Sig   alendronate (FOSAMAX) 70 MG tablet TAKE 1 TABLET BY MOUTH EVERY 7 (SEVEN) DAYS. TAKE WITH A FULL GLASS OF WATER ON AN EMPTY STOMACH.   aspirin EC 81 MG EC tablet Take 1 tablet (81 mg total) by mouth daily.   Calcium Carbonate 260 MG CHEW Chew 1 tablet by mouth daily.   cetirizine (ZYRTEC) 10 MG chewable tablet Chew 10 mg by mouth daily as needed for allergies.   cholecalciferol (VITAMIN D) 1000 UNITS tablet Take 1,000 Units by mouth daily.   clopidogrel (PLAVIX) 75 MG tablet TAKE 1 TABLET BY MOUTH DAILY.   cyclobenzaprine (FLEXERIL) 10 MG tablet TAKE 1 TABLET BY MOUTH TWICE DAILY AS NEEDED FOR MUSCLE SPASMS   diazepam (VALIUM) 2 MG tablet TAKE 1 TABLET BY MOUTH EVERY 8 HOURS AS NEEDED FOR MUSCLE SPASMS   ezetimibe (ZETIA) 10 MG tablet TAKE 1 TABLET BY MOUTH ONCE A DAY   fenofibrate (TRICOR) 145 MG tablet Take 1 tablet (145 mg total) by mouth daily.   FLUoxetine (PROZAC) 20 MG capsule TAKE 1 CAPSULE BY MOUTH ONCE DAILY   fluticasone (FLONASE) 50 MCG/ACT nasal spray Place 1 spray into both nostrils daily as needed for allergies or rhinitis.   icosapent Ethyl (VASCEPA) 1 g capsule TAKE 2 CAPSULES BY MOUTH TWICE DAILY (Patient taking differently: Take 1 g by mouth 2 (two) times daily.)   isosorbide mononitrate (IMDUR) 30 MG 24 hr tablet Take 1 tablet (30 mg total) by mouth daily.   metFORMIN (GLUCOPHAGE-XR) 500 MG 24 hr tablet Take 1 tablet (500 mg total) by mouth daily with breakfast.   metoprolol succinate (TOPROL-XL) 25 MG 24 hr tablet TAKE 1/2 OF  A TABLET BY MOUTH DAILY.   Multiple Vitamin (MULTIVITAMIN WITH MINERALS) TABS tablet Take 1 tablet by mouth daily.   nitroGLYCERIN (NITROSTAT) 0.4 MG SL tablet Place 1 tablet (0.4 mg total) under the tongue every 5 (five) minutes as needed for chest pain.   simvastatin (ZOCOR) 40 MG tablet TAKE 1 TABLET BY MOUTH DAILY.   telmisartan (MICARDIS) 40 MG tablet TAKE 1 TABLET BY MOUTH EVERY MORNING * NEEDS OFFICE VISIT   [DISCONTINUED] rosuvastatin (CRESTOR) 20 MG tablet Take 1 tablet (20 mg total) by mouth daily.   Facility-Administered Encounter Medications as of 10/23/2020  Medication   sodium chloride flush (NS) 0.9 % injection 3 mL   Have you had any problems recently with your health? Patient states she has had Chest discomfort and aching that had started on July 21 st. Patient states the ED had prescribed her Imdur 30 mg once daily. Patient has seen her cardiologist on 10/18/20 and has stress test scheduled for tomorrow 10/24/20. Patient states her chest discomfort has improved.  Have you had any problems with your pharmacy? Patient states she has no problems with her pharmacy.  What issues or side effects are you having with your medications? Patient states she has no issues or side effects with her medications.  What would you like me to pass along to Tammy Eckard,CPP for them to help you with?  Patient  states she has had Chest discomfort and aching that had started on July 21st. Patient states the ED had prescribed her Imdur 30 mg once daily. Patient has seen her cardiologist on 10/18/20 and  has stress test scheduled for tomorrow 10/24/20. Patient states she still has the Myalgia symptoms after stopping the simvastatin medication so her and her cardiologist decided to start back on it.  What can we do to take care of you better? Patient states there is nothing at this time.   Star Rating Drugs: Telmisartan 40 mg Last filled:08/17/20 90 DS Metformin 500 mg Last filled:08/04/20 90  DS Simvastatin 40 mg Last filled:05/09/20 90 DS  Myriam Carolin Coy, RMA [facility]cierge

## 2020-10-24 ENCOUNTER — Other Ambulatory Visit: Payer: Self-pay

## 2020-10-24 ENCOUNTER — Encounter (HOSPITAL_BASED_OUTPATIENT_CLINIC_OR_DEPARTMENT_OTHER): Payer: Self-pay

## 2020-10-24 ENCOUNTER — Encounter: Payer: Self-pay | Admitting: Family Medicine

## 2020-10-24 ENCOUNTER — Ambulatory Visit (HOSPITAL_COMMUNITY): Payer: PPO | Attending: Cardiovascular Disease

## 2020-10-24 VITALS — Ht 61.0 in | Wt 205.0 lb

## 2020-10-24 DIAGNOSIS — R110 Nausea: Secondary | ICD-10-CM

## 2020-10-24 DIAGNOSIS — I25118 Atherosclerotic heart disease of native coronary artery with other forms of angina pectoris: Secondary | ICD-10-CM | POA: Diagnosis not present

## 2020-10-24 DIAGNOSIS — I10 Essential (primary) hypertension: Secondary | ICD-10-CM

## 2020-10-24 LAB — MYOCARDIAL PERFUSION IMAGING
LV dias vol: 89 mL (ref 46–106)
LV sys vol: 28 mL
Peak HR: 79 {beats}/min
Rest HR: 52 {beats}/min
SDS: 1
SRS: 0
SSS: 1
TID: 0.91

## 2020-10-24 MED ORDER — TECHNETIUM TC 99M TETROFOSMIN IV KIT
32.5000 | PACK | Freq: Once | INTRAVENOUS | Status: AC | PRN
  Administered 2020-10-24: 32.5 via INTRAVENOUS
  Filled 2020-10-24: qty 33

## 2020-10-24 MED ORDER — REGADENOSON 0.4 MG/5ML IV SOLN
0.4000 mg | Freq: Once | INTRAVENOUS | Status: AC
  Administered 2020-10-24: 0.4 mg via INTRAVENOUS

## 2020-10-24 MED ORDER — TECHNETIUM TC 99M TETROFOSMIN IV KIT
10.9000 | PACK | Freq: Once | INTRAVENOUS | Status: AC | PRN
  Administered 2020-10-24: 10.9 via INTRAVENOUS
  Filled 2020-10-24: qty 11

## 2020-10-24 MED ORDER — AMINOPHYLLINE 25 MG/ML IV SOLN
75.0000 mg | Freq: Once | INTRAVENOUS | Status: AC
  Administered 2020-10-24: 75 mg via INTRAVENOUS

## 2020-10-25 ENCOUNTER — Telehealth: Payer: Self-pay

## 2020-10-25 NOTE — Telephone Encounter (Signed)
Spoke to patient about email sent this morning.She stated she got a voice mail yesterday that her myoview was normal.Advised I will send PCP Dr.Copeland results.Advised to keep appointment already scheduled with Dr.Hilty 9/14 at 8:00 am.

## 2020-10-26 ENCOUNTER — Encounter (HOSPITAL_COMMUNITY): Payer: Self-pay | Admitting: Emergency Medicine

## 2020-10-26 ENCOUNTER — Other Ambulatory Visit: Payer: Self-pay

## 2020-10-26 ENCOUNTER — Emergency Department (HOSPITAL_COMMUNITY): Payer: PPO

## 2020-10-26 ENCOUNTER — Inpatient Hospital Stay (HOSPITAL_COMMUNITY)
Admission: EM | Admit: 2020-10-26 | Discharge: 2020-10-30 | DRG: 287 | Disposition: A | Discharge status: Home / Self Care | Payer: PPO | Attending: Internal Medicine | Admitting: Internal Medicine

## 2020-10-26 DIAGNOSIS — E7800 Pure hypercholesterolemia, unspecified: Secondary | ICD-10-CM | POA: Diagnosis present

## 2020-10-26 DIAGNOSIS — G4730 Sleep apnea, unspecified: Secondary | ICD-10-CM | POA: Diagnosis present

## 2020-10-26 DIAGNOSIS — Z8249 Family history of ischemic heart disease and other diseases of the circulatory system: Secondary | ICD-10-CM | POA: Diagnosis not present

## 2020-10-26 DIAGNOSIS — I10 Essential (primary) hypertension: Secondary | ICD-10-CM | POA: Diagnosis not present

## 2020-10-26 DIAGNOSIS — Z20822 Contact with and (suspected) exposure to covid-19: Secondary | ICD-10-CM | POA: Diagnosis present

## 2020-10-26 DIAGNOSIS — I200 Unstable angina: Secondary | ICD-10-CM | POA: Diagnosis not present

## 2020-10-26 DIAGNOSIS — I5032 Chronic diastolic (congestive) heart failure: Secondary | ICD-10-CM | POA: Diagnosis present

## 2020-10-26 DIAGNOSIS — Z90710 Acquired absence of both cervix and uterus: Secondary | ICD-10-CM

## 2020-10-26 DIAGNOSIS — I2510 Atherosclerotic heart disease of native coronary artery without angina pectoris: Secondary | ICD-10-CM | POA: Diagnosis present

## 2020-10-26 DIAGNOSIS — R079 Chest pain, unspecified: Secondary | ICD-10-CM | POA: Diagnosis not present

## 2020-10-26 DIAGNOSIS — R110 Nausea: Secondary | ICD-10-CM | POA: Diagnosis not present

## 2020-10-26 DIAGNOSIS — N179 Acute kidney failure, unspecified: Secondary | ICD-10-CM

## 2020-10-26 DIAGNOSIS — I110 Hypertensive heart disease with heart failure: Secondary | ICD-10-CM | POA: Diagnosis present

## 2020-10-26 DIAGNOSIS — Z833 Family history of diabetes mellitus: Secondary | ICD-10-CM

## 2020-10-26 DIAGNOSIS — Z955 Presence of coronary angioplasty implant and graft: Secondary | ICD-10-CM

## 2020-10-26 DIAGNOSIS — R0789 Other chest pain: Principal | ICD-10-CM | POA: Diagnosis present

## 2020-10-26 DIAGNOSIS — E118 Type 2 diabetes mellitus with unspecified complications: Secondary | ICD-10-CM | POA: Diagnosis present

## 2020-10-26 DIAGNOSIS — F419 Anxiety disorder, unspecified: Secondary | ICD-10-CM | POA: Diagnosis present

## 2020-10-26 DIAGNOSIS — Z7982 Long term (current) use of aspirin: Secondary | ICD-10-CM | POA: Diagnosis not present

## 2020-10-26 DIAGNOSIS — E66812 Obesity, class 2: Secondary | ICD-10-CM | POA: Diagnosis present

## 2020-10-26 DIAGNOSIS — E119 Type 2 diabetes mellitus without complications: Secondary | ICD-10-CM | POA: Diagnosis present

## 2020-10-26 DIAGNOSIS — K219 Gastro-esophageal reflux disease without esophagitis: Secondary | ICD-10-CM | POA: Diagnosis present

## 2020-10-26 DIAGNOSIS — Z7902 Long term (current) use of antithrombotics/antiplatelets: Secondary | ICD-10-CM

## 2020-10-26 DIAGNOSIS — R0902 Hypoxemia: Secondary | ICD-10-CM | POA: Diagnosis not present

## 2020-10-26 DIAGNOSIS — I259 Chronic ischemic heart disease, unspecified: Secondary | ICD-10-CM

## 2020-10-26 DIAGNOSIS — E1159 Type 2 diabetes mellitus with other circulatory complications: Secondary | ICD-10-CM | POA: Diagnosis present

## 2020-10-26 DIAGNOSIS — K5900 Constipation, unspecified: Secondary | ICD-10-CM | POA: Diagnosis present

## 2020-10-26 DIAGNOSIS — E1169 Type 2 diabetes mellitus with other specified complication: Secondary | ICD-10-CM | POA: Diagnosis present

## 2020-10-26 DIAGNOSIS — Z7984 Long term (current) use of oral hypoglycemic drugs: Secondary | ICD-10-CM | POA: Diagnosis not present

## 2020-10-26 DIAGNOSIS — E785 Hyperlipidemia, unspecified: Secondary | ICD-10-CM | POA: Diagnosis present

## 2020-10-26 LAB — BASIC METABOLIC PANEL
Anion gap: 9 (ref 5–15)
BUN: 18 mg/dL (ref 8–23)
CO2: 25 mmol/L (ref 22–32)
Calcium: 10.2 mg/dL (ref 8.9–10.3)
Chloride: 107 mmol/L (ref 98–111)
Creatinine, Ser: 1.26 mg/dL — ABNORMAL HIGH (ref 0.44–1.00)
GFR, Estimated: 46 mL/min — ABNORMAL LOW (ref >60)
Glucose, Bld: 179 mg/dL — ABNORMAL HIGH (ref 70–99)
Potassium: 3.6 mmol/L (ref 3.5–5.1)
Sodium: 141 mmol/L (ref 135–145)

## 2020-10-26 LAB — CBC
HCT: 38.9 % (ref 36.0–46.0)
Hemoglobin: 12.3 g/dL (ref 12.0–15.0)
MCH: 25.6 pg — ABNORMAL LOW (ref 26.0–34.0)
MCHC: 31.6 g/dL (ref 30.0–36.0)
MCV: 81 fL (ref 80.0–100.0)
Platelets: 396 10*3/uL (ref 150–400)
RBC: 4.8 MIL/uL (ref 3.87–5.11)
RDW: 14.6 % (ref 11.5–15.5)
WBC: 11.5 10*3/uL — ABNORMAL HIGH (ref 4.0–10.5)
nRBC: 0 % (ref 0.0–0.2)

## 2020-10-26 LAB — TROPONIN I (HIGH SENSITIVITY)
Troponin I (High Sensitivity): 12 ng/L (ref <18)
Troponin I (High Sensitivity): 19 ng/L — ABNORMAL HIGH (ref <18)
Troponin I (High Sensitivity): 18 ng/L — ABNORMAL HIGH (ref <18)

## 2020-10-26 MED ORDER — NITROGLYCERIN 0.4 MG SL SUBL
0.4000 mg | SUBLINGUAL_TABLET | SUBLINGUAL | Status: AC | PRN
  Administered 2020-10-26 – 2020-10-28 (×3): 0.4 mg via SUBLINGUAL
  Filled 2020-10-26 (×2): qty 1

## 2020-10-26 MED ORDER — ISOSORBIDE MONONITRATE ER 30 MG PO TB24
30.0000 mg | ORAL_TABLET | Freq: Every day | ORAL | Status: DC
  Administered 2020-10-26: 30 mg via ORAL
  Filled 2020-10-26 (×2): qty 1

## 2020-10-26 MED ORDER — ASPIRIN EC 81 MG PO TBEC
81.0000 mg | DELAYED_RELEASE_TABLET | Freq: Every day | ORAL | Status: DC
  Administered 2020-10-27 – 2020-10-30 (×4): 81 mg via ORAL
  Filled 2020-10-26 (×4): qty 1

## 2020-10-26 MED ORDER — ICOSAPENT ETHYL 1 G PO CAPS
1.0000 g | ORAL_CAPSULE | Freq: Two times a day (BID) | ORAL | Status: DC
  Administered 2020-10-27 – 2020-10-28 (×4): 1 g via ORAL
  Filled 2020-10-26 (×4): qty 1

## 2020-10-26 MED ORDER — HEPARIN SODIUM (PORCINE) 5000 UNIT/ML IJ SOLN
5000.0000 [IU] | Freq: Three times a day (TID) | INTRAMUSCULAR | Status: DC
  Administered 2020-10-26 – 2020-10-29 (×9): 5000 [IU] via SUBCUTANEOUS
  Filled 2020-10-26 (×9): qty 1

## 2020-10-26 MED ORDER — ALUM & MAG HYDROXIDE-SIMETH 200-200-20 MG/5ML PO SUSP
30.0000 mL | Freq: Once | ORAL | Status: AC
  Administered 2020-10-26: 30 mL via ORAL
  Filled 2020-10-26: qty 30

## 2020-10-26 MED ORDER — IRBESARTAN 150 MG PO TABS
150.0000 mg | ORAL_TABLET | Freq: Every day | ORAL | Status: DC
  Administered 2020-10-26 – 2020-10-30 (×5): 150 mg via ORAL
  Filled 2020-10-26 (×6): qty 1

## 2020-10-26 MED ORDER — CLOPIDOGREL BISULFATE 75 MG PO TABS
75.0000 mg | ORAL_TABLET | Freq: Every day | ORAL | Status: DC
  Administered 2020-10-26 – 2020-10-30 (×5): 75 mg via ORAL
  Filled 2020-10-26 (×5): qty 1

## 2020-10-26 MED ORDER — ACETAMINOPHEN 325 MG PO TABS
650.0000 mg | ORAL_TABLET | ORAL | Status: DC | PRN
Start: 2020-10-26 — End: 2020-10-30
  Administered 2020-10-27 (×2): 650 mg via ORAL
  Filled 2020-10-26 (×2): qty 2

## 2020-10-26 MED ORDER — AMLODIPINE BESYLATE 5 MG PO TABS
5.0000 mg | ORAL_TABLET | Freq: Every day | ORAL | Status: DC
  Administered 2020-10-26 – 2020-10-27 (×2): 5 mg via ORAL
  Filled 2020-10-26 (×2): qty 1

## 2020-10-26 MED ORDER — EZETIMIBE 10 MG PO TABS
10.0000 mg | ORAL_TABLET | Freq: Every day | ORAL | Status: DC
  Administered 2020-10-27 – 2020-10-30 (×4): 10 mg via ORAL
  Filled 2020-10-26 (×4): qty 1

## 2020-10-26 MED ORDER — FLUOXETINE HCL 20 MG PO CAPS
20.0000 mg | ORAL_CAPSULE | Freq: Every day | ORAL | Status: DC
  Administered 2020-10-27 – 2020-10-30 (×4): 20 mg via ORAL
  Filled 2020-10-26 (×4): qty 1

## 2020-10-26 MED ORDER — NITROGLYCERIN IN D5W 200-5 MCG/ML-% IV SOLN
0.0000 ug/min | INTRAVENOUS | Status: DC
  Administered 2020-10-26: 5 ug/min via INTRAVENOUS
  Filled 2020-10-26: qty 250

## 2020-10-26 MED ORDER — SIMVASTATIN 20 MG PO TABS
40.0000 mg | ORAL_TABLET | Freq: Every day | ORAL | Status: DC
  Administered 2020-10-27: 40 mg via ORAL
  Filled 2020-10-26: qty 2

## 2020-10-26 MED ORDER — METFORMIN HCL ER 500 MG PO TB24
500.0000 mg | ORAL_TABLET | Freq: Every day | ORAL | Status: DC
  Administered 2020-10-27 – 2020-10-28 (×2): 500 mg via ORAL
  Filled 2020-10-26 (×2): qty 1

## 2020-10-26 MED ORDER — FENOFIBRATE 160 MG PO TABS
160.0000 mg | ORAL_TABLET | Freq: Every day | ORAL | Status: DC
  Administered 2020-10-27 – 2020-10-28 (×2): 160 mg via ORAL
  Filled 2020-10-26 (×2): qty 1

## 2020-10-26 MED ORDER — ONDANSETRON HCL 4 MG/2ML IJ SOLN: 4.0000 mg | Freq: Four times a day (QID) | INTRAMUSCULAR | Status: DC | PRN

## 2020-10-26 MED ORDER — METOPROLOL SUCCINATE ER 25 MG PO TB24
12.5000 mg | ORAL_TABLET | Freq: Every day | ORAL | Status: DC
  Administered 2020-10-27 – 2020-10-30 (×4): 12.5 mg via ORAL
  Filled 2020-10-26 (×4): qty 1

## 2020-10-26 NOTE — ED Notes (Signed)
Pt assisted to bedside commode and back to bed.

## 2020-10-26 NOTE — ED Notes (Signed)
Pt now with worsening chest tightness radiating to her L neck. ekg obtained. Cards to the bedside.

## 2020-10-26 NOTE — ED Notes (Signed)
[phone] Molly Maduro ( Son ) would like updates on the patients.

## 2020-10-26 NOTE — ED Provider Notes (Signed)
[facility] EMERGENCY DEPARTMENT Provider Note   CSN: [id number] Arrival date & time: 10/26/20  1339     History Chief Complaint  Patient presents with   Chest Pain    [name] is a 70 y.o. female.  70 yo F with a chief complaint of chest pain.  This is left-sided and radiates up into her neck and then into her left shoulder has made her short of breath.  Has been going off and on for a couple weeks now.  Typically resolves after a period of rest.  Today she was at her friend's house and she went up the stairs and had worsening and persistent symptoms.  Actually had a stress test 2 days ago that was unremarkable.  She denies cough congestion or fever.  Denies nausea vomiting or diarrhea.  Denies any abdominal pain.  She was recently started on Imdur and has not felt that there is been much improvement.  The history is provided by the patient.  Chest Pain Pain location:  L chest Pain quality: aching   Pain radiates to:  Neck and L shoulder Pain severity:  Moderate Onset quality:  Gradual Duration:  3 weeks Timing:  Intermittent Progression:  Waxing and waning Chronicity:  New Relieved by:  Nothing Worsened by:  Nothing Ineffective treatments:  None tried Associated symptoms: shortness of breath   Associated symptoms: no dizziness, no fever, no headache, no nausea, no palpitations and no vomiting    HPI: A 70 year old patient with a history of hypertension and hypercholesterolemia presents for evaluation of chest pain. Initial onset of pain was approximately 3-6 hours ago. The patient's chest pain is described as heaviness/pressure/tightness and is not worse with exertion. The patient's chest pain is not middle- or left-sided, is not well-localized, is not sharp and does not radiate to the arms/jaw/neck. The patient does not complain of nausea and denies diaphoresis. The patient has no history of stroke, has no history of peripheral artery disease, has not smoked  in the past 90 days, denies any history of treated diabetes, has no relevant family history of coronary artery disease (first degree relative at less than age 81) and does not have an elevated BMI (>=30).   Past Medical History:  Diagnosis Date   Anemia    Arthritis    Bronchitis    hx of    CAD (coronary artery disease)    a. s/p DES to LAD in 2010 with patent stent by cath in 2014 b. low-risk NST in 11/2016   Cataracts, bilateral    Diabetes mellitus without complication (HCC)    Family history of adverse reaction to anesthesia    pts mother had difficulty awakening    GERD (gastroesophageal reflux disease)    Hyperlipidemia LDL goal < 70    Hypertension    Lumbar back pain    Numbness    left leg and foot    Plantar fascia rupture    Left Foot   Sleep apnea    uses CPAP     Patient Active Problem List   Diagnosis Date Noted   Obstructive sleep apnea treated with continuous positive airway pressure (CPAP) 05/07/2018   Chest pain 01/31/2018   Morbid obesity (HCC) 11/03/2017   Coronary artery disease involving native coronary artery of native heart with unstable angina pectoris (HCC) 11/03/2017   Insomnia 11/03/2017   Inadequate sleep hygiene 11/03/2017   Anxiety 07/21/2017   Dizziness 10/02/2016   Right patella fracture 08/29/2016  Acquired foot deformity, left 07/18/2016   Osteopenia 11/10/2015   HNP (herniated nucleus pulposus), lumbar 10/12/2014   Spinal stenosis at L4-L5 level 10/12/2014   Iron deficiency anemia 07/29/2014   DM (diabetes mellitus) with complications (HCC) 07/29/2014   Environmental and seasonal allergies 04/28/2014   Spinal stenosis, lumbar region, with neurogenic claudication 01/06/2013   Obesity, morbid, BMI 40.0-49.9 (HCC) 10/20/2012   Chest pain with moderate risk of acute coronary syndrome 10/02/2012   CAD S/P percutaneous coronary angioplasty    Hyperlipidemia with target LDL less than 70    Essential hypertension 04/16/2012    Past  Surgical History:  Procedure Laterality Date   ABDOMINAL HYSTERECTOMY     BACK SURGERY     BREAST CYST ASPIRATION  1995   CAROTID STENT  2009   pt denies    CORONARY ANGIOPLASTY WITH STENT PLACEMENT  2010and 10-02-2012   Stent DES, Xience to prox. LAD   DOPPLER ECHOCARDIOGRAPHY  08/01/2009   EF=>55%,LV normal   LEFT HEART CATH AND CORONARY ANGIOGRAPHY N/A 12/10/2019   Procedure: LEFT HEART CATH AND CORONARY ANGIOGRAPHY;  Surgeon: Lyn Records, MD;  Location: [facility] INVASIVE CV LAB;  Service: Cardiovascular;  Laterality: N/A;   LEFT HEART CATHETERIZATION WITH CORONARY ANGIOGRAM N/A 10/02/2012   Procedure: LEFT HEART CATHETERIZATION WITH CORONARY ANGIOGRAM;  Surgeon: Runell Gess, MD;  Location: [facility] CATH LAB;  Service: Cardiovascular;  Laterality: N/A;   lower arterial duplex  06/20/10   abi's normal,rgt 0.98,lft 1.06;bilateral PVRs normal   LUMBAR LAMINECTOMY/DECOMPRESSION MICRODISCECTOMY Left 01/06/2013   Procedure: MICRO LUMBAR DECOMPRESSION L4-5 AND L5-S1;  Surgeon: Javier Docker, MD;  Location: [facility] ORS;  Service: Orthopedics;  Laterality: Left;   LUMBAR LAMINECTOMY/DECOMPRESSION MICRODISCECTOMY Left 10/12/2014   Procedure: REVISION MICRO LUMBAR/DECOMPRESSION L4-5 LEFT ;  Surgeon: Jene Every, MD;  Location: [facility] ORS;  Service: Orthopedics;  Laterality: Left;   NM MYOCAR PERF WALL MOTION  09/22/2008   lexiscan-EF 83%; glogal LV systolic fx is norm. ,evidence of mild ischemia basal anterior,midanterior and apical lateral region(s).    ORIF PATELLA Right 08/29/2016   Procedure: OPEN REDUCTION INTERNAL (ORIF) FIXATION RIGHT PATELLA;  Surgeon: Samson Frederic, MD;  Location: [facility] ORS;  Service: Orthopedics;  Laterality: Right;  Adductor Block   TUBAL LIGATION     TYMPANOPLASTY Bilateral    UVULOPALATOPHARYNGOPLASTY     pt denies      OB History   No obstetric history on file.     Family History  Problem Relation Age of Onset   Coronary artery disease Mother    Rheum arthritis Mother     Dementia Mother    Heart attack Father    Hypertension Father    Hyperlipidemia Father    Other Father        MVA   Hypertension Brother    Cancer Brother    Heart disease Brother    Cancer Paternal Grandmother        stomach   Diabetes Paternal Grandfather    Breast cancer Neg Hx     Social History   Tobacco Use   Smoking status: Never   Smokeless tobacco: Never  Vaping Use   Vaping Use: Never used  Substance Use Topics   Alcohol use: Yes    Comment: occasional, 1 a month wine   Drug use: No    Home Medications Prior to Admission medications   Medication Sig Start Date End Date Taking? Authorizing Provider  alendronate (FOSAMAX) 70 MG tablet TAKE 1 TABLET BY  MOUTH EVERY 7 (SEVEN) DAYS. TAKE WITH A FULL GLASS OF WATER ON AN EMPTY STOMACH. 08/22/20 08/22/21 Yes Copland, [name], MD  aspirin EC 81 MG EC tablet Take 1 tablet (81 mg total) by mouth daily. 08/30/16  Yes Swinteck, Arlys John, MD  Calcium Carbonate 260 MG CHEW Chew 1 tablet by mouth daily.   Yes [provider]  cetirizine (ZYRTEC) 10 MG chewable tablet Chew 10 mg by mouth daily as needed for allergies.   Yes [provider]  cholecalciferol (VITAMIN D) 1000 UNITS tablet Take 1,000 Units by mouth daily.   Yes [provider]  clopidogrel (PLAVIX) 75 MG tablet TAKE 1 TABLET BY MOUTH DAILY. 04/17/20 04/17/21 Yes Hilty, Lisette Abu, MD  cyclobenzaprine (FLEXERIL) 10 MG tablet TAKE 1 TABLET BY MOUTH TWICE DAILY AS NEEDED FOR MUSCLE SPASMS 05/08/18  Yes Copland, [name], MD  diazepam (VALIUM) 2 MG tablet TAKE 1 TABLET BY MOUTH EVERY 8 HOURS AS NEEDED FOR MUSCLE SPASMS 11/30/19  Yes Copland, [name], MD  ezetimibe (ZETIA) 10 MG tablet TAKE 1 TABLET BY MOUTH ONCE A DAY 05/17/20 05/17/21 Yes Hilty, Lisette Abu, MD  fenofibrate (TRICOR) 145 MG tablet Take 1 tablet (145 mg total) by mouth daily. 08/18/20  Yes Hilty, Lisette Abu, MD  FLUoxetine (PROZAC) 20 MG capsule TAKE 1 CAPSULE BY MOUTH ONCE DAILY 11/22/19  12/03/20 Yes Copland, [name], MD  fluticasone (FLONASE) 50 MCG/ACT nasal spray Place 1 spray into both nostrils daily as needed for allergies or rhinitis.   Yes [provider]  icosapent Ethyl (VASCEPA) 1 g capsule TAKE 2 CAPSULES BY MOUTH TWICE DAILY Patient taking differently: Take 1 g by mouth 2 (two) times daily. 02/09/20 02/08/21 Yes Copland, [name], MD  isosorbide mononitrate (IMDUR) 30 MG 24 hr tablet Take 1 tablet (30 mg total) by mouth daily. 10/18/20  Yes Alver Sorrow, NP  metFORMIN (GLUCOPHAGE-XR) 500 MG 24 hr tablet Take 1 tablet (500 mg total) by mouth daily with breakfast. 08/07/20  Yes Copland, [name], MD  metoprolol succinate (TOPROL-XL) 25 MG 24 hr tablet TAKE 1/2 OF A TABLET BY MOUTH DAILY. 12/08/19 12/25/20 Yes Copland, [name], MD  Multiple Vitamin (MULTIVITAMIN WITH MINERALS) TABS tablet Take 1 tablet by mouth daily.   Yes [provider]  nitroGLYCERIN (NITROSTAT) 0.4 MG SL tablet Place 1 tablet (0.4 mg total) under the tongue every 5 (five) minutes as needed for chest pain. 12/13/19  Yes Hilty, Lisette Abu, MD  simvastatin (ZOCOR) 40 MG tablet TAKE 1 TABLET BY MOUTH DAILY. 05/09/20 05/09/21 Yes Copland, [name], MD  telmisartan (MICARDIS) 40 MG tablet TAKE 1 TABLET BY MOUTH EVERY MORNING * NEEDS OFFICE VISIT 08/17/20 08/17/21 Yes Hilty, Lisette Abu, MD  rosuvastatin (CRESTOR) 20 MG tablet Take 1 tablet (20 mg total) by mouth daily. 12/29/19 02/27/20  Ronney Asters, NP    Allergies    Crestor [rosuvastatin] and Niacin and related  Review of Systems   Review of Systems  Constitutional:  Negative for chills and fever.  HENT:  Negative for congestion and rhinorrhea.   Eyes:  Negative for redness and visual disturbance.  Respiratory:  Positive for shortness of breath. Negative for wheezing.   Cardiovascular:  Positive for chest pain. Negative for palpitations.  Gastrointestinal:  Negative for nausea and vomiting.  Genitourinary:  Negative for dysuria  and urgency.  Musculoskeletal:  Negative for arthralgias and myalgias.  Skin:  Negative for pallor and wound.  Neurological:  Negative for dizziness and  headaches.   Physical Exam Updated Vital Signs BP (!) 160/53 (BP Location: Right Arm)   Pulse 66   Temp 98.2 F (36.8 C) (Oral)   Resp 16   LMP  (LMP Unknown)   SpO2 96%   Physical Exam Vitals and nursing note reviewed.  Constitutional:      General: She is not in acute distress.    Appearance: She is well-developed. She is not diaphoretic.     Comments: BMI 38  HENT:     Head: Normocephalic and atraumatic.  Eyes:     Pupils: Pupils are equal, round, and reactive to light.  Cardiovascular:     Rate and Rhythm: Normal rate and regular rhythm.     Heart sounds: No murmur heard.   No friction rub. No gallop.  Pulmonary:     Effort: Pulmonary effort is normal.     Breath sounds: No wheezing or rales.  Chest:     Chest wall: Tenderness present.     Comments: Mild tenderness over the left anterior chest wall. Abdominal:     General: There is no distension.     Palpations: Abdomen is soft.     Tenderness: There is no abdominal tenderness.  Musculoskeletal:        General: No tenderness.     Cervical back: Normal range of motion and neck supple.  Skin:    General: Skin is warm and dry.  Neurological:     Mental Status: She is alert and oriented to person, place, and time.  Psychiatric:        Behavior: Behavior normal.    ED Results / Procedures / Treatments   Labs (all labs ordered are listed, but only abnormal results are displayed) Labs Reviewed  BASIC METABOLIC PANEL - Abnormal; Notable for the following components:      Result Value   Glucose, Bld 179 (*)    Creatinine, Ser 1.26 (*)    GFR, Estimated 46 (*)    All other components within normal limits  CBC - Abnormal; Notable for the following components:   WBC 11.5 (*)    MCH 25.6 (*)    All other components within normal limits  BASIC METABOLIC PANEL -  Abnormal; Notable for the following components:   Glucose, Bld 151 (*)    Creatinine, Ser 1.06 (*)    GFR, Estimated 56 (*)    All other components within normal limits  TROPONIN I (HIGH SENSITIVITY) - Abnormal; Notable for the following components:   Troponin I (High Sensitivity) 19 (*)    All other components within normal limits  TROPONIN I (HIGH SENSITIVITY) - Abnormal; Notable for the following components:   Troponin I (High Sensitivity) 18 (*)    All other components within normal limits  SARS CORONAVIRUS 2 (TAT 6-24 HRS)  TROPONIN I (HIGH SENSITIVITY)  TROPONIN I (HIGH SENSITIVITY)    EKG EKG Interpretation  Date/Time:  Thursday October 26 2020 14:15:42 EDT Ventricular Rate:  72 PR Interval:  168 QRS Duration: 144 QT Interval:  424 QTC Calculation: 464 R Axis:   35 Text Interpretation: Normal sinus rhythm Right bundle branch block Abnormal ECG No significant change since last tracing Confirmed by Melene PlanFloyd, Paysley Poplar [phone]([id number]) on 10/26/2020 5:07:27 PM  Radiology DG Chest 2 View  Result Date: 10/26/2020 CLINICAL DATA:  Chest pain EXAM: CHEST - 2 VIEW COMPARISON:  04/03/2020 FINDINGS: The heart size and mediastinal contours are within normal limits. Both lungs are clear. The visualized skeletal structures are unremarkable.  IMPRESSION: No acute abnormality of the lungs in AP portable projection. Electronically Signed   By: Lauralyn Primes M.D.   On: 10/26/2020 15:07   ECHOCARDIOGRAM COMPLETE  Result Date: 10/27/2020    ECHOCARDIOGRAM REPORT   Patient Name:   [name] Date of Exam: 10/27/2020 Medical Rec #:  [id number]    Height:       61.0 in Accession #:    [id number]   Weight:       205.0 lb Date of Birth:  [birth year]    BSA:          1.909 m Patient Age:    70 years     BP:           139/56 mmHg Patient Gender: F            HR:           63 bpm. Exam Location:  Inpatient Procedure: 2D Echo, Cardiac Doppler and Color Doppler Indications:    Chest pain  History:        Patient has prior history  of Echocardiogram examinations, most                 recent 12/13/2019. CAD, Signs/Symptoms:Chest Pain; Risk                 Factors:Hypertension and Dyslipidemia. DOE.  Sonographer:    Ross Ludwig RDCS (AE) Referring Phys: [id number] Parke Poisson IMPRESSIONS  1. Left ventricular ejection fraction, by estimation, is 60 to 65%. The left ventricle has normal function. The left ventricle has no regional wall motion abnormalities. There is moderate asymmetric left ventricular hypertrophy. Left ventricular diastolic parameters were normal.  2. Right ventricular systolic function is normal. The right ventricular size is normal.  3. The mitral valve is normal in structure. Trivial mitral valve regurgitation.  4. The aortic valve is tricuspid. There is mild thickening of the aortic valve. Aortic valve regurgitation is not visualized. No aortic stenosis is present. FINDINGS  Left Ventricle: Left ventricular ejection fraction, by estimation, is 60 to 65%. The left ventricle has normal function. The left ventricle has no regional wall motion abnormalities. The left ventricular internal cavity size was small. There is moderate  asymmetric left ventricular hypertrophy. Left ventricular diastolic parameters were normal. Right Ventricle: The right ventricular size is normal. Right vetricular wall thickness was not well visualized. Right ventricular systolic function is normal. Left Atrium: Left atrial size was normal in size. Right Atrium: Right atrial size was normal in size. Pericardium: There is no evidence of pericardial effusion. Mitral Valve: The mitral valve is normal in structure. Trivial mitral valve regurgitation. MV peak gradient, 4.9 mmHg. The mean mitral valve gradient is 2.0 mmHg. Tricuspid Valve: The tricuspid valve is normal in structure. Tricuspid valve regurgitation is trivial. Aortic Valve: The aortic valve is tricuspid. There is mild thickening of the aortic valve. Aortic valve regurgitation is not visualized.  No aortic stenosis is present. Aortic valve mean gradient measures 5.0 mmHg. Aortic valve peak gradient measures 8.4 mmHg. Aortic valve area, by VTI measures 3.08 cm. Pulmonic Valve: The pulmonic valve was normal in structure. Pulmonic valve regurgitation is not visualized. Aorta: The aortic root and ascending aorta are structurally normal, with no evidence of dilitation. IAS/Shunts: The atrial septum is grossly normal.  LEFT VENTRICLE PLAX 2D LVIDd:         4.20 cm  Diastology LVIDs:         2.20 cm  LV e'  medial:    6.31 cm/s LV PW:         1.40 cm  LV E/e' medial:  13.3 LV IVS:        1.50 cm  LV e' lateral:   9.03 cm/s LVOT diam:     2.00 cm  LV E/e' lateral: 9.3 LV SV:         83 LV SV Index:   43 LVOT Area:     3.14 cm  RIGHT VENTRICLE             IVC RV Basal diam:  3.00 cm     IVC diam: 1.50 cm RV S prime:     14.00 cm/s TAPSE (M-mode): 2.5 cm LEFT ATRIUM             Index       RIGHT ATRIUM           Index LA diam:        3.40 cm 1.78 cm/m  RA Area:     12.70 cm LA Vol (A2C):   41.0 ml 21.48 ml/m RA Volume:   28.80 ml  15.09 ml/m LA Vol (A4C):   46.4 ml 24.31 ml/m LA Biplane Vol: 45.9 ml 24.05 ml/m  AORTIC VALVE AV Area (Vmax):    2.71 cm AV Area (Vmean):   2.70 cm AV Area (VTI):     3.08 cm AV Vmax:           145.00 cm/s AV Vmean:          99.300 cm/s AV VTI:            0.268 m AV Peak Grad:      8.4 mmHg AV Mean Grad:      5.0 mmHg LVOT Vmax:         125.00 cm/s LVOT Vmean:        85.500 cm/s LVOT VTI:          0.263 m LVOT/AV VTI ratio: 0.98  AORTA Ao Root diam: 3.10 cm Ao Asc diam:  3.00 cm MITRAL VALVE MV Area (PHT): 2.22 cm     SHUNTS MV Area VTI:   2.66 cm     Systemic VTI:  0.26 m MV Peak grad:  4.9 mmHg     Systemic Diam: 2.00 cm MV Mean grad:  2.0 mmHg MV Vmax:       1.11 m/s MV Vmean:      61.8 cm/s MV Decel Time: 342 msec MV E velocity: 83.80 cm/s MV A velocity: 103.00 cm/s MV E/A ratio:  0.81 Kristeen Miss MD Electronically signed by Kristeen Miss MD Signature Date/Time:  10/27/2020/10:46:53 AM    Final     Procedures Procedures   Medications Ordered in ED Medications  nitroGLYCERIN (NITROSTAT) SL tablet 0.4 mg (0.4 mg Sublingual Given 10/26/20 1731)  acetaminophen (TYLENOL) tablet 650 mg (650 mg Oral Given 10/27/20 0857)  ondansetron (ZOFRAN) injection 4 mg (has no administration in time range)  heparin injection 5,000 Units (5,000 Units Subcutaneous Given 10/26/20 2239)  aspirin EC tablet 81 mg (81 mg Oral Given 10/27/20 0829)  clopidogrel (PLAVIX) tablet 75 mg (75 mg Oral Given 10/27/20 0828)  ezetimibe (ZETIA) tablet 10 mg (10 mg Oral Given 10/27/20 0830)  fenofibrate tablet 160 mg (160 mg Oral Given 10/27/20 1213)  FLUoxetine (PROZAC) capsule 20 mg (20 mg Oral Given 10/27/20 0827)  icosapent Ethyl (VASCEPA) 1 g capsule 1 g (1 g Oral Given 10/27/20 1214)  metFORMIN (GLUCOPHAGE-XR) 24  hr tablet 500 mg (500 mg Oral Given 10/27/20 1214)  metoprolol succinate (TOPROL-XL) 24 hr tablet 12.5 mg (12.5 mg Oral Given 10/27/20 0830)  irbesartan (AVAPRO) tablet 150 mg (150 mg Oral Given 10/27/20 0828)  amLODipine (NORVASC) tablet 5 mg (5 mg Oral Given 10/27/20 0828)  isosorbide mononitrate (IMDUR) 24 hr tablet 60 mg (has no administration in time range)  simvastatin (ZOCOR) tablet 20 mg (has no administration in time range)  alum & mag hydroxide-simeth (MAALOX/MYLANTA) 200-200-20 MG/5ML suspension 30 mL (30 mLs Oral Given 10/26/20 1742)  alum & mag hydroxide-simeth (MAALOX/MYLANTA) 200-200-20 MG/5ML suspension 30 mL (30 mLs Oral Given 10/26/20 2238)    ED Course  I have reviewed the triage vital signs and the nursing notes.  Pertinent labs & imaging results that were available during my care of the patient were reviewed by me and considered in my medical decision making (see chart for details).    MDM Rules/Calculators/A&P HEAR Score: 59                         70 yo F with a significant past medical history of a stent in the LAD comes in with a chief complaints of exertional chest pain  that resolves with rest.  Has been intermittent for the past 3 weeks and now is persistent.  Feels different from when she had the stent placed.  At that time she only had isolated leg pain and cramping.  She has had some leg pain and cramping with this event today.  Her initial troponin is negative.  EKG with no change from prior.  With her having very typical symptoms discussed with cardiology who will come and evaluate at bedside.  Cards to admit.   CRITICAL CARE Performed by: Rae Roam   Total critical care time: 35 minutes  Critical care time was exclusive of separately billable procedures and treating other patients.  Critical care was necessary to treat or prevent imminent or life-threatening deterioration.  Critical care was time spent personally by me on the following activities: development of treatment plan with patient and/or surrogate as well as nursing, discussions with consultants, evaluation of patient's response to treatment, examination of patient, obtaining history from patient or surrogate, ordering and performing treatments and interventions, ordering and review of laboratory studies, ordering and review of radiographic studies, pulse oximetry and re-evaluation of patient's condition.  The patients results and plan were reviewed and discussed.   Any x-rays performed were independently reviewed by myself.   Differential diagnosis were considered with the presenting HPI.  Medications  nitroGLYCERIN (NITROSTAT) SL tablet 0.4 mg (0.4 mg Sublingual Given 10/26/20 1731)  acetaminophen (TYLENOL) tablet 650 mg (650 mg Oral Given 10/27/20 0857)  ondansetron (ZOFRAN) injection 4 mg (has no administration in time range)  heparin injection 5,000 Units (5,000 Units Subcutaneous Given 10/26/20 2239)  aspirin EC tablet 81 mg (81 mg Oral Given 10/27/20 0829)  clopidogrel (PLAVIX) tablet 75 mg (75 mg Oral Given 10/27/20 0828)  ezetimibe (ZETIA) tablet 10 mg (10 mg Oral Given 10/27/20  0830)  fenofibrate tablet 160 mg (160 mg Oral Given 10/27/20 1213)  FLUoxetine (PROZAC) capsule 20 mg (20 mg Oral Given 10/27/20 0827)  icosapent Ethyl (VASCEPA) 1 g capsule 1 g (1 g Oral Given 10/27/20 1214)  metFORMIN (GLUCOPHAGE-XR) 24 hr tablet 500 mg (500 mg Oral Given 10/27/20 1214)  metoprolol succinate (TOPROL-XL) 24 hr tablet 12.5 mg (12.5 mg Oral Given 10/27/20 0830)  irbesartan (AVAPRO)  tablet 150 mg (150 mg Oral Given 10/27/20 0828)  amLODipine (NORVASC) tablet 5 mg (5 mg Oral Given 10/27/20 0828)  isosorbide mononitrate (IMDUR) 24 hr tablet 60 mg (has no administration in time range)  simvastatin (ZOCOR) tablet 20 mg (has no administration in time range)  alum & mag hydroxide-simeth (MAALOX/MYLANTA) 200-200-20 MG/5ML suspension 30 mL (30 mLs Oral Given 10/26/20 1742)  alum & mag hydroxide-simeth (MAALOX/MYLANTA) 200-200-20 MG/5ML suspension 30 mL (30 mLs Oral Given 10/26/20 2238)    Vitals:   10/27/20 0543 10/27/20 0600 10/27/20 0700 10/27/20 1140  BP:  (!) 127/57 (!) 139/56 (!) 160/53  Pulse:  64 67 66  Resp:  18  16  Temp: 98 F (36.7 C)  98.4 F (36.9 C) 98.2 F (36.8 C)  TempSrc: Oral  Oral Oral  SpO2:  92% 99% 96%    Final diagnoses:  Unstable angina (HCC)    Admission/ observation were discussed with the admitting physician, patient and/or family and they are comfortable with the plan.    Final Clinical Impression(s) / ED Diagnoses Final diagnoses:  Unstable angina [facility])    Rx / DC Orders ED Discharge Orders     None        Melene Plan, DO 10/27/20 1501

## 2020-10-26 NOTE — ED Triage Notes (Signed)
Patient BIB [facility] for evaluation of chest pain. Received SL NTG, 324mg  ASA, and 4mg  zofran PTA EMS. No pain at this time. 20 g in left AC  BP 151/67 Hr 85 SpO2 99% on room air

## 2020-10-26 NOTE — H&P (Signed)
Cardiology Admission History and Physical:   Patient ID: [name] MRN: [id number]; DOB: 1950/11/28   Admission date: 10/26/2020  PCP:  Pearline Cables, MD   [facility] Providers Cardiologist:  Chrystie Nose, MD      [name], NP  Chief Complaint:  Chest pain  Patient Profile:   [name] is a 70 y.o. female with  CAD, HTN, anxiety, HLD, who is being seen 10/26/2020 for the evaluation of Chest pain.  History of Present Illness:   [name] was seen by [name] on 10/18/2020.  She had a recent ER visit for chest pain.  A Lexiscan Myoview was performed on 10/24/2020.  It was a low risk stress test with an EF of 68%, no ischemia and possible small area of infarction anteriorly vs breast attenuation on my independent review of images.  Upon reviewing these results, [name] advised [name] that her chest pain was not heart related.  She came to the emergency room today for chest pain and was evaluated.  Her daughter [name] is my patient and is at the bedside, and provides additional history.   Patient notes that more recently over the last few weeks she has had exertional shortness of breath and chest pain. Pain radiates to left side of the neck. Substernal chest pressure and tightness. This is the same pain she was evaluated for and had a nonischemic nuc as noted above. Blood pressure is elevated, and LVEDP was elevated on cath last year which showed a patent LAD stent and no other CAD.   The patient denies palpitations, PND, orthopnea, or leg swelling. Denies cough, fever, chills. Denies nausea, vomiting. Denies syncope or presyncope. Denies dizziness or lightheadedness.     Past Medical History:  Diagnosis Date   Anemia    Arthritis    Bronchitis    hx of    CAD (coronary artery disease)    a. s/p DES to LAD in 2010 with patent stent by cath in 2014 b. low-risk NST in 11/2016   Cataracts, bilateral    Diabetes mellitus without complication (HCC)    Family  history of adverse reaction to anesthesia    pts mother had difficulty awakening    GERD (gastroesophageal reflux disease)    Hyperlipidemia LDL goal < 70    Hypertension    Lumbar back pain    Numbness    left leg and foot    Plantar fascia rupture    Left Foot   Sleep apnea    uses CPAP     Past Surgical History:  Procedure Laterality Date   ABDOMINAL HYSTERECTOMY     BACK SURGERY     BREAST CYST ASPIRATION  1995   CAROTID STENT  2009   pt denies    CORONARY ANGIOPLASTY WITH STENT PLACEMENT  2010and 10-02-2012   Stent DES, Xience to prox. LAD   DOPPLER ECHOCARDIOGRAPHY  08/01/2009   EF=>55%,LV normal   LEFT HEART CATH AND CORONARY ANGIOGRAPHY N/A 12/10/2019   Procedure: LEFT HEART CATH AND CORONARY ANGIOGRAPHY;  Surgeon: Lyn Records, MD;  Location: [facility] INVASIVE CV LAB;  Service: Cardiovascular;  Laterality: N/A;   LEFT HEART CATHETERIZATION WITH CORONARY ANGIOGRAM N/A 10/02/2012   Procedure: LEFT HEART CATHETERIZATION WITH CORONARY ANGIOGRAM;  Surgeon: Runell Gess, MD;  Location: [facility] CATH LAB;  Service: Cardiovascular;  Laterality: N/A;   lower arterial duplex  06/20/10   abi's normal,rgt 0.98,lft 1.06;bilateral PVRs normal   LUMBAR LAMINECTOMY/DECOMPRESSION MICRODISCECTOMY  Left 01/06/2013   Procedure: MICRO LUMBAR DECOMPRESSION L4-5 AND L5-S1;  Surgeon: Javier Docker, MD;  Location: [facility] ORS;  Service: Orthopedics;  Laterality: Left;   LUMBAR LAMINECTOMY/DECOMPRESSION MICRODISCECTOMY Left 10/12/2014   Procedure: REVISION MICRO LUMBAR/DECOMPRESSION L4-5 LEFT ;  Surgeon: Jene Every, MD;  Location: [facility] ORS;  Service: Orthopedics;  Laterality: Left;   NM MYOCAR PERF WALL MOTION  09/22/2008   lexiscan-EF 83%; glogal LV systolic fx is norm. ,evidence of mild ischemia basal anterior,midanterior and apical lateral region(s).    ORIF PATELLA Right 08/29/2016   Procedure: OPEN REDUCTION INTERNAL (ORIF) FIXATION RIGHT PATELLA;  Surgeon: Samson Frederic, MD;  Location: [facility] ORS;   Service: Orthopedics;  Laterality: Right;  Adductor Block   TUBAL LIGATION     TYMPANOPLASTY Bilateral    UVULOPALATOPHARYNGOPLASTY     pt denies      Medications Prior to Admission: Prior to Admission medications   Medication Sig Start Date End Date Taking? Authorizing Provider  alendronate (FOSAMAX) 70 MG tablet TAKE 1 TABLET BY MOUTH EVERY 7 (SEVEN) DAYS. TAKE WITH A FULL GLASS OF WATER ON AN EMPTY STOMACH. 08/22/20 08/22/21 Yes Copland, [name], MD  aspirin EC 81 MG EC tablet Take 1 tablet (81 mg total) by mouth daily. 08/30/16  Yes Swinteck, Arlys John, MD  Calcium Carbonate 260 MG CHEW Chew 1 tablet by mouth daily.   Yes [provider]  cetirizine (ZYRTEC) 10 MG chewable tablet Chew 10 mg by mouth daily as needed for allergies.   Yes [provider]  cholecalciferol (VITAMIN D) 1000 UNITS tablet Take 1,000 Units by mouth daily.   Yes [provider]  clopidogrel (PLAVIX) 75 MG tablet TAKE 1 TABLET BY MOUTH DAILY. 04/17/20 04/17/21 Yes Hilty, Lisette Abu, MD  cyclobenzaprine (FLEXERIL) 10 MG tablet TAKE 1 TABLET BY MOUTH TWICE DAILY AS NEEDED FOR MUSCLE SPASMS 05/08/18  Yes Copland, [name], MD  diazepam (VALIUM) 2 MG tablet TAKE 1 TABLET BY MOUTH EVERY 8 HOURS AS NEEDED FOR MUSCLE SPASMS 11/30/19  Yes Copland, [name], MD  ezetimibe (ZETIA) 10 MG tablet TAKE 1 TABLET BY MOUTH ONCE A DAY 05/17/20 05/17/21 Yes Hilty, Lisette Abu, MD  fenofibrate (TRICOR) 145 MG tablet Take 1 tablet (145 mg total) by mouth daily. 08/18/20  Yes Hilty, Lisette Abu, MD  FLUoxetine (PROZAC) 20 MG capsule TAKE 1 CAPSULE BY MOUTH ONCE DAILY 11/22/19 12/03/20 Yes Copland, [name], MD  fluticasone (FLONASE) 50 MCG/ACT nasal spray Place 1 spray into both nostrils daily as needed for allergies or rhinitis.   Yes [provider]  icosapent Ethyl (VASCEPA) 1 g capsule TAKE 2 CAPSULES BY MOUTH TWICE DAILY Patient taking differently: Take 1 g by mouth 2 (two) times daily. 02/09/20 02/08/21 Yes  Copland, [name], MD  isosorbide mononitrate (IMDUR) 30 MG 24 hr tablet Take 1 tablet (30 mg total) by mouth daily. 10/18/20  Yes Alver Sorrow, NP  metFORMIN (GLUCOPHAGE-XR) 500 MG 24 hr tablet Take 1 tablet (500 mg total) by mouth daily with breakfast. 08/07/20  Yes Copland, [name], MD  metoprolol succinate (TOPROL-XL) 25 MG 24 hr tablet TAKE 1/2 OF A TABLET BY MOUTH DAILY. 12/08/19 12/25/20 Yes Copland, [name], MD  Multiple Vitamin (MULTIVITAMIN WITH MINERALS) TABS tablet Take 1 tablet by mouth daily.   Yes [provider]  nitroGLYCERIN (NITROSTAT) 0.4 MG SL tablet Place 1 tablet (0.4 mg total) under the tongue every 5 (five) minutes as needed for chest pain. 12/13/19  Yes Zoila Shutter  C, MD  simvastatin (ZOCOR) 40 MG tablet TAKE 1 TABLET BY MOUTH DAILY. 05/09/20 05/09/21 Yes Copland, [name], MD  telmisartan (MICARDIS) 40 MG tablet TAKE 1 TABLET BY MOUTH EVERY MORNING * NEEDS OFFICE VISIT 08/17/20 08/17/21 Yes Hilty, Lisette Abu, MD  rosuvastatin (CRESTOR) 20 MG tablet Take 1 tablet (20 mg total) by mouth daily. 12/29/19 02/27/20  Ronney Asters, NP     Allergies:    Allergies  Allergen Reactions   Crestor [Rosuvastatin] Other (See Comments)    myalgia   Niacin And Related Other (See Comments)    Whelps and skin flushed, mouth tingling    Social History:   Social History   Socioeconomic History   Marital status: Married    Spouse name: Interior and spatial designer   Number of children: 2   Years of education: MSN   Highest education level: Not on file  Occupational History   Occupation: DIRECTOR    Employer: [employer]  Tobacco Use   Smoking status: Never   Smokeless tobacco: Never  Vaping Use   Vaping Use: Never used  Substance and Sexual Activity   Alcohol use: Yes    Comment: occasional, 1 a month wine   Drug use: No   Sexual activity: Yes  Other Topics Concern   Not on file  Social History Narrative      Married to [place]. Education: [employer]. Exercise: Yes   Patient  lives at home with her spouse. retired   Caffeine use: 1 drink of tea a day   Social Determinants of Corporate investment banker Strain: Low Risk    Difficulty of Paying Living Expenses: Not very hard  Food Insecurity: Not on file  Transportation Needs: Not on file  Physical Activity: Insufficiently Active   Days of Exercise per Week: 3 days   Minutes of Exercise per Session: 30 min  Stress: Not on file  Social Connections: Not on file  Intimate Partner Violence: Not on file    Family History:   The patient's family history includes Cancer in her brother and paternal grandmother; Coronary artery disease in her mother; Dementia in her mother; Diabetes in her paternal grandfather; Heart attack in her father; Heart disease in her brother; Hyperlipidemia in her father; Hypertension in her brother and father; Other in her father; Rheum arthritis in her mother. There is no history of Breast cancer.    ROS:  Please see the history of present illness.  All other ROS reviewed and negative.     Physical Exam/Data:   Vitals:   10/26/20 2030 10/26/20 2115 10/26/20 2130 10/26/20 2200  BP: (!) 139/100 (!) 126/37 (!) 145/53 138/64  Pulse: 74 71 66 69  Resp: (!) 21 15 20 20   Temp:      TempSrc:      SpO2: 96% 97% 95% 96%    Intake/Output Summary (Last 24 hours) at 10/26/2020 2208 Last data filed at 10/26/2020 1914 Gross per 24 hour  Intake 4.13 ml  Output --  Net 4.13 ml   Last 3 Weights 10/24/2020 10/18/2020 06/19/2020  Weight (lbs) 205 lb 205 lb 3.2 oz 206 lb 4.8 oz  Weight (kg) 92.987 kg 93.078 kg 93.577 kg     There is no height or weight on file to calculate BMI.  Constitutional: No acute distress Eyes: sclera non-icteric, normal conjunctiva and lids ENMT: normal dentition, moist mucous membranes Cardiovascular: regular rhythm, normal rate, no murmurs. S1 and S2 normal. Radial pulses normal bilaterally. No jugular venous distention.  Respiratory: clear to auscultation bilaterally GI  : normal bowel sounds, soft and nontender. No distention.   MSK: extremities warm, well perfused. No edema.  NEURO: grossly nonfocal exam, moves all extremities. PSYCH: alert and oriented x 3, normal mood and affect.    EKG:  The ECG that was done today was personally reviewed and demonstrates sinus rhythm, heart rate 72, right bundle branch block is old  Tele: SR ,RBBB  Relevant CV Studies:  LEXISCAN MYOVIEW: 10/24/2020 Nuclear stress EF: 68%. The left ventricular ejection fraction is hyperdynamic (>65%). There was no ST segment deviation noted during stress. This is a low risk study. There is no evidence of ischemia and no evidence of previous infarction. The study is normal.   ECHO: 12/13/2019  1. Left ventricular ejection fraction, by estimation, is 60 to 65%. The  left ventricle has normal function. The left ventricle has no regional  wall motion abnormalities. There is moderate asymmetric left ventricular  hypertrophy of the basal-septal  segment. Left ventricular diastolic parameters are indeterminate.   2. Right ventricular systolic function is normal. The right ventricular  size is normal. There is normal pulmonary artery systolic pressure.   3. The mitral valve is normal in structure. Trivial mitral valve  regurgitation. No evidence of mitral stenosis.   4. The aortic valve is grossly normal. There is mild calcification of the  aortic valve. There is mild thickening of the aortic valve. Aortic valve  regurgitation is not visualized. No aortic stenosis is present.   5. The inferior vena cava is normal in size with greater than 50%  respiratory variability, suggesting right atrial pressure of 3 mmHg.   CARDIAC CATH: 12/10/2019 Widely patent coronary arteries including the LAD stent. Myocardial ischemia with no obstructive coronary disease (INOCA). Chronic diastolic heart failure, LVEDP greater than 22 mmHg.  EF 65%.   RECOMMENDATIONS:   Sublingual nitroglycerin for  episodes of chest pain of prolonged. Consider adding calcium channel blocker therapy as the first medication to treat micro circulatory dysfunction. Therapy for chronic diastolic heart failure, consider SGLT2 if not already being used.  Laboratory Data:  High Sensitivity Troponin:   Recent Labs  Lab 10/26/20 1426 10/26/20 1619  TROPONINIHS 12 19*      Chemistry Recent Labs  Lab 10/26/20 1426  NA 141  K 3.6  CL 107  CO2 25  GLUCOSE 179*  BUN 18  CREATININE 1.26*  CALCIUM 10.2  GFRNONAA 46*  ANIONGAP 9    No results for input(s): PROT, ALBUMIN, AST, ALT, ALKPHOS, BILITOT in the last 168 hours. Hematology Recent Labs  Lab 10/26/20 1426  WBC 11.5*  RBC 4.80  HGB 12.3  HCT 38.9  MCV 81.0  MCH 25.6*  MCHC 31.6  RDW 14.6  PLT 396   BNPNo results for input(s): BNP, PROBNP in the last 168 hours.  DDimer No results for input(s): DDIMER in the last 168 hours.   Radiology/Studies:  DG Chest 2 View  Result Date: 10/26/2020 CLINICAL DATA:  Chest pain EXAM: CHEST - 2 VIEW COMPARISON:  04/03/2020 FINDINGS: The heart size and mediastinal contours are within normal limits. Both lungs are clear. The visualized skeletal structures are unremarkable. IMPRESSION: No acute abnormality of the lungs in AP portable projection. Electronically Signed   By: Lauralyn PrimesAlex  Bibbey M.D.   On: 10/26/2020 15:07     Assessment and Plan:   Chest pain: HTN CAD - stent to LAD in 2010 - grossly normal troponins without significant delta. I am most suspicious  of hypertensive episodes contributing to microvascular angina, she needs better blood pressure control. Unclear why she has a mild AKI, if this returns to baseline, I would add spironolactone as an outpatient given elevated LVEDP on cath, she may do well on a low dose of a diuretic going forward. Could also titrate imdur. I will add amlodpine 5 mg daily. She is currently on a nitro gtt.  4. AKI - encourage oral hydration and will avoid adding any  additional diuretic at this time, though would consider this as outpatient.    Risk Assessment/Risk Scores:    HEAR Score (for undifferentiated chest pain):  HEAR Score: 5   For questions or updates, please contact [facility] Please consult [email or web address] for contact info under     Signed, Parke Poisson, MD  10/26/2020 10:08 PM

## 2020-10-26 NOTE — ED Triage Notes (Signed)
Patient complains of a 3/10 chest pain after receiving NTG from EMS. Patient states she has had three similar events over the last three weeks, also reports hypertension over the last three weeks and a blood pressure over 200 systolic on Tuesday at her stress test. Patient has had several changes and additions to her medications over the last few weeks.

## 2020-10-26 NOTE — ED Provider Notes (Signed)
Emergency Medicine Provider Triage Evaluation Note  [name] , a 70 y.o. female  was evaluated in triage.  Pt complains of chest pain, left-sided, radiates into her back, came on suddenly today, was a 5 out of 10 pain now is back down to 0 she had 3 nitros, has history of stent back in 2010.  Had recent catheterization 2021, shows occlusion of the LAD stents, patient has no other complaints at this time.  Review of Systems  Positive: Chest pain Negative: Shortness breath, abdominal pain  Physical Exam  BP (!) 175/64 (BP Location: Right Arm)   Pulse 72   Temp 97.6 F (36.4 C) (Oral)   Resp 18   LMP  (LMP Unknown)   SpO2 95%  Gen:   Awake, no distress   Resp:  Normal effort  MSK:   Moves extremities without difficulty  Other:    Medical Decision Making  Medically screening exam initiated at 2:28 PM.  Appropriate orders placed.  [name] was informed that the remainder of the evaluation will be completed by another provider, this initial triage assessment does not replace that evaluation, and the importance of remaining in the ED until their evaluation is complete.  Presents with chest pain, patient will need further work-up.   Carroll Sage, PA-C 10/26/20 1430    Horton, Clabe Seal, DO 10/27/20 [phone]

## 2020-10-27 ENCOUNTER — Other Ambulatory Visit: Payer: Self-pay

## 2020-10-27 ENCOUNTER — Observation Stay (HOSPITAL_COMMUNITY): Payer: PPO

## 2020-10-27 ENCOUNTER — Encounter (HOSPITAL_COMMUNITY): Payer: Self-pay | Admitting: Internal Medicine

## 2020-10-27 ENCOUNTER — Other Ambulatory Visit (HOSPITAL_COMMUNITY): Payer: Self-pay

## 2020-10-27 DIAGNOSIS — R079 Chest pain, unspecified: Secondary | ICD-10-CM

## 2020-10-27 DIAGNOSIS — I259 Chronic ischemic heart disease, unspecified: Secondary | ICD-10-CM | POA: Diagnosis not present

## 2020-10-27 LAB — BASIC METABOLIC PANEL
Anion gap: 11 (ref 5–15)
BUN: 20 mg/dL (ref 8–23)
CO2: 22 mmol/L (ref 22–32)
Calcium: 9.4 mg/dL (ref 8.9–10.3)
Chloride: 105 mmol/L (ref 98–111)
Creatinine, Ser: 1.06 mg/dL — ABNORMAL HIGH (ref 0.44–1.00)
GFR, Estimated: 56 mL/min — ABNORMAL LOW (ref >60)
Glucose, Bld: 151 mg/dL — ABNORMAL HIGH (ref 70–99)
Potassium: 4.2 mmol/L (ref 3.5–5.1)
Sodium: 138 mmol/L (ref 135–145)

## 2020-10-27 LAB — ECHOCARDIOGRAM COMPLETE
AR max vel: 2.71 cm2
AV Area VTI: 3.08 cm2
AV Area mean vel: 2.7 cm2
AV Mean grad: 5 mmHg
AV Peak grad: 8.4 mmHg
Ao pk vel: 1.45 m/s
Area-P 1/2: 2.22 cm2
MV VTI: 2.66 cm2
S' Lateral: 2.2 cm

## 2020-10-27 LAB — SARS CORONAVIRUS 2 (TAT 6-24 HRS): SARS Coronavirus 2: NEGATIVE

## 2020-10-27 LAB — TROPONIN I (HIGH SENSITIVITY): Troponin I (High Sensitivity): 16 ng/L (ref <18)

## 2020-10-27 MED ORDER — ISOSORBIDE MONONITRATE ER 60 MG PO TB24
60.0000 mg | ORAL_TABLET | Freq: Every day | ORAL | Status: DC
  Administered 2020-10-28 – 2020-10-30 (×3): 60 mg via ORAL
  Filled 2020-10-27 (×3): qty 1

## 2020-10-27 MED ORDER — SIMVASTATIN 20 MG PO TABS
20.0000 mg | ORAL_TABLET | Freq: Every day | ORAL | Status: DC
  Administered 2020-10-28 – 2020-10-30 (×3): 20 mg via ORAL
  Filled 2020-10-27 (×3): qty 1

## 2020-10-27 NOTE — Plan of Care (Signed)

## 2020-10-27 NOTE — ED Notes (Signed)
Report attempted x 1

## 2020-10-27 NOTE — Plan of Care (Signed)

## 2020-10-27 NOTE — Progress Notes (Addendum)
Progress Note  Patient Name: [name] Date of Encounter: 10/27/2020  [facility] Cardiologist: Chrystie Nose, MD   Subjective  Intermittent chest pain overnight.  None this morning.  Reports excess salt in her diet.  Also has exertional shortness of breath.  Inpatient Medications    Scheduled Meds:  amLODipine  5 mg Oral Daily   aspirin EC  81 mg Oral Daily   clopidogrel  75 mg Oral Daily   ezetimibe  10 mg Oral Daily   fenofibrate  160 mg Oral Daily   FLUoxetine  20 mg Oral Daily   heparin  5,000 Units Subcutaneous Q8H   icosapent Ethyl  1 g Oral BID   irbesartan  150 mg Oral Daily   [START ON 10/28/2020] isosorbide mononitrate  60 mg Oral Daily   metFORMIN  500 mg Oral Q breakfast   metoprolol succinate  12.5 mg Oral Daily   simvastatin  40 mg Oral Daily   Continuous Infusions:  PRN Meds: acetaminophen, nitroGLYCERIN, ondansetron (ZOFRAN) IV   Vital Signs    Vitals:   10/27/20 0530 10/27/20 0543 10/27/20 0600 10/27/20 0700  BP: (!) 120/49  (!) 127/57 (!) 139/56  Pulse: 64  64 67  Resp: 19  18   Temp:  98 F (36.7 C)  98.4 F (36.9 C)  TempSrc:  Oral  Oral  SpO2: 92%  92% 99%    Intake/Output Summary (Last 24 hours) at 10/27/2020 1002 Last data filed at 10/27/2020 0338 Gross per 24 hour  Intake 63.65 ml  Output --  Net 63.65 ml   Last 3 Weights 10/24/2020 10/18/2020 06/19/2020  Weight (lbs) 205 lb 205 lb 3.2 oz 206 lb 4.8 oz  Weight (kg) 92.987 kg 93.078 kg 93.577 kg      Telemetry    Normal sinus rhythm- Personally Reviewed  ECG    No new tracing  Physical Exam   GEN: No acute distress.   Neck: No JVD Cardiac: RRR, no murmurs, rubs, or gallops.  Respiratory: Clear to auscultation bilaterally. GI: Soft, nontender, non-distended  MS: No edema; No deformity. Neuro:  Nonfocal  Psych: Normal affect   Labs    High Sensitivity Troponin:   Recent Labs  Lab 10/26/20 1426 10/26/20 1619 10/26/20 2152 10/26/20 2352  TROPONINIHS 12 19* 18*  16      Chemistry Recent Labs  Lab 10/26/20 1426  NA 141  K 3.6  CL 107  CO2 25  GLUCOSE 179*  BUN 18  CREATININE 1.26*  CALCIUM 10.2  GFRNONAA 46*  ANIONGAP 9     Hematology Recent Labs  Lab 10/26/20 1426  WBC 11.5*  RBC 4.80  HGB 12.3  HCT 38.9  MCV 81.0  MCH 25.6*  MCHC 31.6  RDW 14.6  PLT 396      Radiology    DG Chest 2 View  Result Date: 10/26/2020 CLINICAL DATA:  Chest pain EXAM: CHEST - 2 VIEW COMPARISON:  04/03/2020 FINDINGS: The heart size and mediastinal contours are within normal limits. Both lungs are clear. The visualized skeletal structures are unremarkable. IMPRESSION: No acute abnormality of the lungs in AP portable projection. Electronically Signed   By: [name] M.D.   On: 10/26/2020 15:07    Cardiac Studies   Echocardiogram done, pending reading  Patient Profile     70 y.o. female with history of CAD, hypertension, hyperlipidemia and anxiety seen for chest pain.  Assessment & Plan    1.  Chest pain -Troponin  negative.  EKG without acute ischemic changes.  Recent outpatient stress test was low risk . study. - Per Dr. Jacques Simpson "  It was a low risk stress test with an EF of 68%, no ischemia and possible small area of infarction anteriorly vs breast attenuation on my independent review of images".  -Suspect her symptoms related to hypertensive episode and diastolic dysfunction -Cardiac catheterization last year without obstructive disease -Continues to treat her medically with excellent blood pressure control.  Will stop nitroglycerin drip.  Increase Imdur to 60 mg daily.  Amlodipine has been added yesterday.  Plan to ambulate in hallway later today and tomorrow.  If no recurrent chest pain, she is okay to discharge over the weekend otherwise cath Monday.  2.  Hypertension -Blood pressure stable this morning -Goal less than 130/80 -Continue amlodipine 5 mg daily, Avapro 150 mg daily, Toprol-XL 12.5 mg daily (can change to Coreg for  better blood pressure control) and Imdur  3.  Chronic diastolic dysfunction -Elevated LVEDP by cath last year -Pending echocardiogram reading this morning -Per Dr. Jacques Simpson plan to add diuretics in outpatient setting once resolved kidney function -Patient endorses eating excess salt in diet.  4. AKI - Pending BMP this morning.   5.  Diabetes mellitus -On metformin  6. HLD - 08/17/2020: Cholesterol, Total 169; HDL 29; LDL Chol Calc (NIH) 57; Triglycerides 551  -Continue Zocor, Vascepa and Zetia  For questions or updates, please contact [facility] Please consult [email or web address] for contact info under        Signed, [name] Passey, PA  10/27/2020, 10:02 AM     Agree with note by [name] Aus PA-C  [name]  was admitted with chest pain and shortness of breath.  Her enzymes are negative and her EKG shows no acute changes.  I apparently placed an LAD stent over a decade ago.  She had a card catheterization performed a year ago by [name] revealing widely patent stent with no significant CAD.  In particular, her RCA was widely patent.  She has been complaining of nitrate responsive chest pain.  Recent Myoview showed subtle inferoseptal ischemia.  She is on IV nitroglycerin.  At this point, given a fairly recent normal cath I favor continued medical therapy reserving repeat catheterization for recalcitrant symptoms.  Her exam is benign today.  [name], M.D., FACP, [facility], [facility] [address] [place], [place]  [address]  [phone] 10/27/2020 11:13 AM

## 2020-10-27 NOTE — ED Notes (Signed)
Report attempted x2

## 2020-10-27 NOTE — ED Notes (Signed)
Pt stated that IV in left [facility] is a little painful, IV pump ringing out occluded. This RN paused Nitro to flush line, painful for patient. IV taken out. Pt reports that IV site feels a little tender and hard. IV access re established in hand. Nitro restarted. This RN verified with pharmacy that there is nothing to do for nitro infiltration.

## 2020-10-27 NOTE — Progress Notes (Signed)
Pt had ambulated 2x today down the hallway, total of 200 feet both times . Both times pt got short of breath midway. O2 saturation measured  at  last ambulation. Pt had started at 97 % room air and at rest. Midway thru ambulation , pt O2 sat  only dipped to 92 %, even as she was getting short of breath. Recovered quickly on her breathing when she had stopped and rested.

## 2020-10-27 NOTE — TOC Benefit Eligibility Note (Signed)
Patient Advocate Encounter  Insurance verification completed.    The patient is currently admitted and upon discharge could be taking Jardiance 10 mg.  The current 30 day co-pay is, $45.00.   The patient is currently admitted and upon discharge could be taking Farxiga 10 mg.  Non Formulary  The patient is insured through [employer] Part D     Arturo Sofranko, CPhT Pharmacy Patient Advocate Specialist [place] [facility] Stewardship Team Direct Number: [phone]  Fax: [phone]        

## 2020-10-27 NOTE — Progress Notes (Signed)
  Echocardiogram 2D Echocardiogram has been performed.  [name] 10/27/2020, 8:33 AM

## 2020-10-28 ENCOUNTER — Other Ambulatory Visit: Payer: Self-pay

## 2020-10-28 DIAGNOSIS — K219 Gastro-esophageal reflux disease without esophagitis: Secondary | ICD-10-CM | POA: Diagnosis present

## 2020-10-28 DIAGNOSIS — Z8249 Family history of ischemic heart disease and other diseases of the circulatory system: Secondary | ICD-10-CM | POA: Diagnosis not present

## 2020-10-28 DIAGNOSIS — F419 Anxiety disorder, unspecified: Secondary | ICD-10-CM | POA: Diagnosis present

## 2020-10-28 DIAGNOSIS — E119 Type 2 diabetes mellitus without complications: Secondary | ICD-10-CM | POA: Diagnosis not present

## 2020-10-28 DIAGNOSIS — Z20822 Contact with and (suspected) exposure to covid-19: Secondary | ICD-10-CM | POA: Diagnosis not present

## 2020-10-28 DIAGNOSIS — N179 Acute kidney failure, unspecified: Secondary | ICD-10-CM | POA: Diagnosis not present

## 2020-10-28 DIAGNOSIS — I2510 Atherosclerotic heart disease of native coronary artery without angina pectoris: Secondary | ICD-10-CM | POA: Diagnosis not present

## 2020-10-28 DIAGNOSIS — Z90710 Acquired absence of both cervix and uterus: Secondary | ICD-10-CM | POA: Diagnosis not present

## 2020-10-28 DIAGNOSIS — I110 Hypertensive heart disease with heart failure: Secondary | ICD-10-CM | POA: Diagnosis not present

## 2020-10-28 DIAGNOSIS — I259 Chronic ischemic heart disease, unspecified: Secondary | ICD-10-CM | POA: Diagnosis not present

## 2020-10-28 DIAGNOSIS — Z7982 Long term (current) use of aspirin: Secondary | ICD-10-CM | POA: Diagnosis not present

## 2020-10-28 DIAGNOSIS — G4730 Sleep apnea, unspecified: Secondary | ICD-10-CM | POA: Diagnosis present

## 2020-10-28 DIAGNOSIS — Z955 Presence of coronary angioplasty implant and graft: Secondary | ICD-10-CM | POA: Diagnosis not present

## 2020-10-28 DIAGNOSIS — R079 Chest pain, unspecified: Secondary | ICD-10-CM | POA: Diagnosis not present

## 2020-10-28 DIAGNOSIS — Z833 Family history of diabetes mellitus: Secondary | ICD-10-CM | POA: Diagnosis not present

## 2020-10-28 DIAGNOSIS — Z7984 Long term (current) use of oral hypoglycemic drugs: Secondary | ICD-10-CM | POA: Diagnosis not present

## 2020-10-28 DIAGNOSIS — I5032 Chronic diastolic (congestive) heart failure: Secondary | ICD-10-CM | POA: Diagnosis not present

## 2020-10-28 DIAGNOSIS — Z7902 Long term (current) use of antithrombotics/antiplatelets: Secondary | ICD-10-CM | POA: Diagnosis not present

## 2020-10-28 DIAGNOSIS — R0789 Other chest pain: Secondary | ICD-10-CM | POA: Diagnosis not present

## 2020-10-28 DIAGNOSIS — K5900 Constipation, unspecified: Secondary | ICD-10-CM | POA: Diagnosis present

## 2020-10-28 DIAGNOSIS — E7800 Pure hypercholesterolemia, unspecified: Secondary | ICD-10-CM | POA: Diagnosis present

## 2020-10-28 LAB — BASIC METABOLIC PANEL
Anion gap: 6 (ref 5–15)
BUN: 17 mg/dL (ref 8–23)
CO2: 24 mmol/L (ref 22–32)
Calcium: 9.3 mg/dL (ref 8.9–10.3)
Chloride: 107 mmol/L (ref 98–111)
Creatinine, Ser: 0.95 mg/dL (ref 0.44–1.00)
GFR, Estimated: >60 mL/min (ref >60)
Glucose, Bld: 136 mg/dL — ABNORMAL HIGH (ref 70–99)
Potassium: 4.5 mmol/L (ref 3.5–5.1)
Sodium: 137 mmol/L (ref 135–145)

## 2020-10-28 LAB — GLUCOSE, CAPILLARY: Glucose-Capillary: 192 mg/dL — ABNORMAL HIGH (ref 70–99)

## 2020-10-28 LAB — TROPONIN I (HIGH SENSITIVITY)
Troponin I (High Sensitivity): 8 ng/L (ref <18)
Troponin I (High Sensitivity): 8 ng/L (ref <18)

## 2020-10-28 MED ORDER — DIAZEPAM 2 MG PO TABS
2.0000 mg | ORAL_TABLET | Freq: Three times a day (TID) | ORAL | Status: DC | PRN
  Administered 2020-10-28 – 2020-10-29 (×3): 2 mg via ORAL
  Filled 2020-10-28 (×3): qty 1

## 2020-10-28 MED ORDER — AMLODIPINE BESYLATE 5 MG PO TABS: 5.0000 mg | ORAL_TABLET | Freq: Once | ORAL | Status: DC

## 2020-10-28 MED ORDER — POLYETHYLENE GLYCOL 3350 17 G PO PACK
17.0000 g | PACK | Freq: Every day | ORAL | Status: DC
  Administered 2020-10-28 – 2020-10-29 (×2): 17 g via ORAL
  Filled 2020-10-28 (×3): qty 1

## 2020-10-28 MED ORDER — AMLODIPINE BESYLATE 10 MG PO TABS
10.0000 mg | ORAL_TABLET | Freq: Every day | ORAL | Status: DC
  Administered 2020-10-28 – 2020-10-30 (×3): 10 mg via ORAL
  Filled 2020-10-28 (×3): qty 1

## 2020-10-28 MED ORDER — CYCLOBENZAPRINE HCL 10 MG PO TABS: 10.0000 mg | ORAL_TABLET | Freq: Two times a day (BID) | ORAL | Status: DC | PRN

## 2020-10-28 NOTE — Plan of Care (Signed)

## 2020-10-28 NOTE — Progress Notes (Addendum)
Progress Note  Patient Name: [name] Date of Encounter: 10/28/2020  [facility] Cardiologist: Chrystie Nose, MD   Subjective   She continues to complain of intermittent chest pain, severe at first, associated w/ exertion At home sx were associated w/ exertion and SOB, BP very high.  Episode this am was associated w/ ambulation. Says O2 sats dropped w/ ambulation  Treated w/ SL NTG x 2, had leg weakness and ?asterixis after the 2nd one. However, CP resolved.   Inpatient Medications    Scheduled Meds:  amLODipine  5 mg Oral Daily   aspirin EC  81 mg Oral Daily   clopidogrel  75 mg Oral Daily   ezetimibe  10 mg Oral Daily   fenofibrate  160 mg Oral Daily   FLUoxetine  20 mg Oral Daily   heparin  5,000 Units Subcutaneous Q8H   icosapent Ethyl  1 g Oral BID   irbesartan  150 mg Oral Daily   isosorbide mononitrate  60 mg Oral Daily   metFORMIN  500 mg Oral Q breakfast   metoprolol succinate  12.5 mg Oral Daily   simvastatin  20 mg Oral Daily   Continuous Infusions:  PRN Meds: acetaminophen, ondansetron (ZOFRAN) IV   Vital Signs    Vitals:   10/27/20 1140 10/27/20 1519 10/27/20 2100 10/28/20 0612  BP: (!) 160/53 (!) 160/83 (!) 131/45 (!) 160/56  Pulse: 66 66 (!) 54 (!) 55  Resp: 16 16 16 17   Temp: 98.2 F (36.8 C) 98.3 F (36.8 C) 98.1 F (36.7 C) 98 F (36.7 C)  TempSrc: Oral Oral Oral Oral  SpO2: 96%  99% 98%  Weight:  93.1 kg    Height:  5\' 1"  (1.549 m)      Intake/Output Summary (Last 24 hours) at 10/28/2020 0931 Last data filed at 10/27/2020 2145 Gross per 24 hour  Intake 480 ml  Output --  Net 480 ml   Last 3 Weights 10/27/2020 10/24/2020 10/18/2020  Weight (lbs) 205 lb 3.2 oz 205 lb 205 lb 3.2 oz  Weight (kg) 93.078 kg 92.987 kg 93.078 kg      Telemetry    SR - Personally Reviewed  ECG    SR, RBBB is old  Physical Exam   General: Well developed, well nourished, female in no acute distress Head: Eyes PERRLA, Head normocephalic and  atraumatic Lungs: clear bilaterally to auscultation. Heart: HRRR S1 S2, without rub or gallop. No murmur. 4/4 extremity pulses are 2+ & equal. No JVD. Abdomen: Bowel sounds are present, abdomen soft and non-tender without masses or  hernias noted. Msk: Normal strength and tone for age. Extremities: No clubbing, cyanosis or edema.    Skin:  No rashes or lesions noted. Neuro: Alert and oriented X 3. Psych:  Good affect, responds appropriately   Labs    High Sensitivity Troponin:   Recent Labs  Lab 10/26/20 1426 10/26/20 1619 10/26/20 2152 10/26/20 2352  TROPONINIHS 12 19* 18* 16      Chemistry Recent Labs  Lab 10/26/20 1426 10/27/20 0955 10/28/20 0244  NA 141 138 137  K 3.6 4.2 4.5  CL 107 105 107  CO2 25 22 24   GLUCOSE 179* 151* 136*  BUN 18 20 17   CREATININE 1.26* 1.06* 0.95  CALCIUM 10.2 9.4 9.3  GFRNONAA 46* 56* >60  ANIONGAP 9 11 6      Hematology Recent Labs  Lab 10/26/20 1426  WBC 11.5*  RBC 4.80  HGB 12.3  HCT 38.9  MCV 81.0  MCH 25.6*  MCHC 31.6  RDW 14.6  PLT 396      Radiology    DG Chest 2 View  Result Date: 10/26/2020 CLINICAL DATA:  Chest pain EXAM: CHEST - 2 VIEW COMPARISON:  04/03/2020 FINDINGS: The heart size and mediastinal contours are within normal limits. Both lungs are clear. The visualized skeletal structures are unremarkable. IMPRESSION: No acute abnormality of the lungs in AP portable projection. Electronically Signed   By: Lauralyn Primes M.D.   On: 10/26/2020 15:07   ECHOCARDIOGRAM COMPLETE  Result Date: 10/27/2020    ECHOCARDIOGRAM REPORT   Patient Name:   [name] Date of Exam: 10/27/2020 Medical Rec #:  [id number]    Height:       61.0 in Accession #:    [id number]   Weight:       205.0 lb Date of Birth:  Sep 24, 1950    BSA:          1.909 m Patient Age:    70 years     BP:           139/56 mmHg Patient Gender: F            HR:           63 bpm. Exam Location:  Inpatient Procedure: 2D Echo, Cardiac Doppler and Color Doppler  Indications:    Chest pain  History:        Patient has prior history of Echocardiogram examinations, most                 recent 12/13/2019. CAD, Signs/Symptoms:Chest Pain; Risk                 Factors:Hypertension and Dyslipidemia. DOE.  Sonographer:    Ross Ludwig RDCS (AE) Referring Phys: [id number] Parke Poisson IMPRESSIONS  1. Left ventricular ejection fraction, by estimation, is 60 to 65%. The left ventricle has normal function. The left ventricle has no regional wall motion abnormalities. There is moderate asymmetric left ventricular hypertrophy. Left ventricular diastolic parameters were normal.  2. Right ventricular systolic function is normal. The right ventricular size is normal.  3. The mitral valve is normal in structure. Trivial mitral valve regurgitation.  4. The aortic valve is tricuspid. There is mild thickening of the aortic valve. Aortic valve regurgitation is not visualized. No aortic stenosis is present. FINDINGS  Left Ventricle: Left ventricular ejection fraction, by estimation, is 60 to 65%. The left ventricle has normal function. The left ventricle has no regional wall motion abnormalities. The left ventricular internal cavity size was small. There is moderate  asymmetric left ventricular hypertrophy. Left ventricular diastolic parameters were normal. Right Ventricle: The right ventricular size is normal. Right vetricular wall thickness was not well visualized. Right ventricular systolic function is normal. Left Atrium: Left atrial size was normal in size. Right Atrium: Right atrial size was normal in size. Pericardium: There is no evidence of pericardial effusion. Mitral Valve: The mitral valve is normal in structure. Trivial mitral valve regurgitation. MV peak gradient, 4.9 mmHg. The mean mitral valve gradient is 2.0 mmHg. Tricuspid Valve: The tricuspid valve is normal in structure. Tricuspid valve regurgitation is trivial. Aortic Valve: The aortic valve is tricuspid. There is mild  thickening of the aortic valve. Aortic valve regurgitation is not visualized. No aortic stenosis is present. Aortic valve mean gradient measures 5.0 mmHg. Aortic valve peak gradient measures 8.4 mmHg. Aortic valve area, by VTI measures 3.08 cm. Pulmonic Valve: The pulmonic  valve was normal in structure. Pulmonic valve regurgitation is not visualized. Aorta: The aortic root and ascending aorta are structurally normal, with no evidence of dilitation. IAS/Shunts: The atrial septum is grossly normal.  LEFT VENTRICLE PLAX 2D LVIDd:         4.20 cm  Diastology LVIDs:         2.20 cm  LV e' medial:    6.31 cm/s LV PW:         1.40 cm  LV E/e' medial:  13.3 LV IVS:        1.50 cm  LV e' lateral:   9.03 cm/s LVOT diam:     2.00 cm  LV E/e' lateral: 9.3 LV SV:         83 LV SV Index:   43 LVOT Area:     3.14 cm  RIGHT VENTRICLE             IVC RV Basal diam:  3.00 cm     IVC diam: 1.50 cm RV S prime:     14.00 cm/s TAPSE (M-mode): 2.5 cm LEFT ATRIUM             Index       RIGHT ATRIUM           Index LA diam:        3.40 cm 1.78 cm/m  RA Area:     12.70 cm LA Vol (A2C):   41.0 ml 21.48 ml/m RA Volume:   28.80 ml  15.09 ml/m LA Vol (A4C):   46.4 ml 24.31 ml/m LA Biplane Vol: 45.9 ml 24.05 ml/m  AORTIC VALVE AV Area (Vmax):    2.71 cm AV Area (Vmean):   2.70 cm AV Area (VTI):     3.08 cm AV Vmax:           145.00 cm/s AV Vmean:          99.300 cm/s AV VTI:            0.268 m AV Peak Grad:      8.4 mmHg AV Mean Grad:      5.0 mmHg LVOT Vmax:         125.00 cm/s LVOT Vmean:        85.500 cm/s LVOT VTI:          0.263 m LVOT/AV VTI ratio: 0.98  AORTA Ao Root diam: 3.10 cm Ao Asc diam:  3.00 cm MITRAL VALVE MV Area (PHT): 2.22 cm     SHUNTS MV Area VTI:   2.66 cm     Systemic VTI:  0.26 m MV Peak grad:  4.9 mmHg     Systemic Diam: 2.00 cm MV Mean grad:  2.0 mmHg MV Vmax:       1.11 m/s MV Vmean:      61.8 cm/s MV Decel Time: 342 msec MV E velocity: 83.80 cm/s MV A velocity: 103.00 cm/s MV E/A ratio:  0.81 Kristeen MissPhilip  Nahser MD Electronically signed by Kristeen MissPhilip Nahser MD Signature Date/Time: 10/27/2020/10:46:53 AM    Final     Cardiac Studies   Echocardiogram   1. Left ventricular ejection fraction, by estimation, is 60 to 65%. The  left ventricle has normal function. The left ventricle has no regional  wall motion abnormalities. There is moderate asymmetric left ventricular hypertrophy. Left ventricular diastolic parameters were normal.   2. Right ventricular systolic function is normal. The right ventricular  size is normal.   3. The mitral valve  is normal in structure. Trivial mitral valve  regurgitation.   4. The aortic valve is tricuspid. There is mild thickening of the aortic  valve. Aortic valve regurgitation is not visualized. No aortic stenosis is present.   Patient Profile     70 y.o. female with history of CAD, hypertension, hyperlipidemia and anxiety seen for chest pain.  Assessment & Plan    1.  Chest pain - recurrent with exertion - sx concerning despite recent testing - possibly small vessel dz +/- HTN related - cath Monday - keep Imdur at 60 mg qd for now  2.  Hypertension - BP up and down, but mostly up - HR 50s at times, no change in BB - Cr was higher on admit >> no change in ARB - Amlodipine added this admit, will increase to 10 mg qd - follow BP w/ CP  3.  Chronic diastolic dysfunction - nl EF, LVH and normal diastolic function on echo - no diuretic for now  4. AKI - Cr has improved   5.  Diabetes mellitus - use SSI, hold metformin for cath  6. HLD,  08/17/2020: Cholesterol, Total 169; HDL 29; LDL Chol Calc (NIH) 57; Triglycerides 551  - LDL at goal on current rx  7. ?UTI - ck UA  8. Constipation - Miralax  9. Anxiety - restart home rx  For questions or updates, please contact [facility] Please consult [email or web address] for contact info under        Signed, Theodore Demark, PA-C  10/28/2020, 9:31 AM     Patient seen and examined with Theodore Demark  PA-C.  Agree as above, with the following exceptions and changes as noted below. Experiencing waxing and waning chest pain again today. 2 nitro did not help and made her right leg feel numb with tremor on movement - she says this is an old finding. Gen: NAD, CV: RRR, no murmurs, Lungs: clear, Abd: soft, Extrem: Warm, well perfused, no edema, Neuro/Psych: alert and oriented x 3, normal mood and affect. All available labs, radiology testing, previous records reviewed.  With a normal nuclear stress test 4 days ago and normal cath last year, I am still most suspicious for hypertension causing microvascular angina as a source of her symptoms.  She is also quite anxious which may be contributing to elevated blood pressure which is currently 160/62.  I think she needs further titration of antihypertensive therapy as noted above, Rhonda Barrett, PA and I have discussed this and changes are noted in her note above.  She may be an excellent candidate for spironolactone when her renal function stabilizes.  Patient was seen by Dr. Gery Pray yesterday who felt if chest pain recurs and is unable to be controlled by nitro would plan for cardiac catheterization on Monday.  This is a reasonable consideration now that she has had recurrent pain we will arrange cardiac cath for Monday.  In the meantime we will provide her home anxiolytic, a bowel regimen, I have encouraged her to drink plenty of fluids given concentrated urine, and up titration of antihypertensive therapy.  Parke Poisson, MD 10/28/20 10:30 AM

## 2020-10-28 NOTE — Progress Notes (Addendum)
Patient ambulating from bathroom to bed, states she is having CP 2-3 out of 10 mid sternal and pain in left shoulder. Patient also endorses shortness of breath.   4L O2 [place] applied  BP=  161/62 at 0852  Patient stated she felt as if pain was getting worse, 4/10  1 Nitro given at 0855  EKG obtained  Post nitro BP=  135/61 0900 Pain=  4 out of 10  Theodore Demark PA paged at 0902 via amion  1 Nitro given at [phone]  Call back from [place] received. Stated she would have someone see the patient shortly.  Post Niro BP= [id number] Patient endorses worsening pain Pain= 5 out of 10  Patient states when she is taking Nitro causes right leg numbess and weakness. PT endorsing this now, wont give another Nitro until seen by PA  Rapid Fredric Dine, RN notified at [phone]  Theodore Demark PA at bedside at 0921 BP= 160/92  0923:  patient stating pain has eased off now  Pain is  0 out of 10

## 2020-10-28 NOTE — Progress Notes (Signed)
No focal deficit/weakness at this time.  Sensory intact, describes some numbness.

## 2020-10-29 LAB — GLUCOSE, CAPILLARY
Glucose-Capillary: 133 mg/dL — ABNORMAL HIGH (ref 70–99)
Glucose-Capillary: 86 mg/dL (ref 70–99)
Glucose-Capillary: 166 mg/dL — ABNORMAL HIGH (ref 70–99)
Glucose-Capillary: 157 mg/dL — ABNORMAL HIGH (ref 70–99)

## 2020-10-29 LAB — BASIC METABOLIC PANEL
Anion gap: 7 (ref 5–15)
BUN: 25 mg/dL — ABNORMAL HIGH (ref 8–23)
CO2: 26 mmol/L (ref 22–32)
Calcium: 9.9 mg/dL (ref 8.9–10.3)
Chloride: 103 mmol/L (ref 98–111)
Creatinine, Ser: 0.98 mg/dL (ref 0.44–1.00)
GFR, Estimated: >60 mL/min (ref >60)
Glucose, Bld: 155 mg/dL — ABNORMAL HIGH (ref 70–99)
Potassium: 4.2 mmol/L (ref 3.5–5.1)
Sodium: 136 mmol/L (ref 135–145)

## 2020-10-29 LAB — HEMOGLOBIN A1C
Hgb A1c MFr Bld: 7.3 % — ABNORMAL HIGH (ref 4.8–5.6)
Mean Plasma Glucose: 162.81 mg/dL

## 2020-10-29 MED ORDER — SODIUM CHLORIDE 0.9% FLUSH: 3.0000 mL | INTRAVENOUS | Status: DC | PRN

## 2020-10-29 MED ORDER — SODIUM CHLORIDE 0.9 % IV SOLN: INTRAVENOUS | Status: DC

## 2020-10-29 MED ORDER — ICOSAPENT ETHYL 1 G PO CAPS
1.0000 g | ORAL_CAPSULE | Freq: Two times a day (BID) | ORAL | Status: DC
  Administered 2020-10-29 – 2020-10-30 (×3): 1 g via ORAL
  Filled 2020-10-29 (×4): qty 1

## 2020-10-29 MED ORDER — SODIUM CHLORIDE 0.9% FLUSH
3.0000 mL | Freq: Two times a day (BID) | INTRAVENOUS | Status: DC
Start: 2020-10-29 — End: 2020-10-30
  Administered 2020-10-29: 3 mL via INTRAVENOUS

## 2020-10-29 MED ORDER — INSULIN ASPART 100 UNIT/ML IJ SOLN: 0.0000 [IU] | Freq: Every day | INTRAMUSCULAR | Status: DC

## 2020-10-29 MED ORDER — INSULIN ASPART 100 UNIT/ML IJ SOLN
0.0000 [IU] | Freq: Three times a day (TID) | INTRAMUSCULAR | Status: DC
Start: 2020-10-29 — End: 2020-10-30
  Administered 2020-10-29 – 2020-10-30 (×2): 3 [IU] via SUBCUTANEOUS

## 2020-10-29 MED ORDER — FENOFIBRATE 160 MG PO TABS
160.0000 mg | ORAL_TABLET | Freq: Every day | ORAL | Status: DC
  Administered 2020-10-29 – 2020-10-30 (×2): 160 mg via ORAL
  Filled 2020-10-29 (×2): qty 1

## 2020-10-29 MED ORDER — ASPIRIN 81 MG PO CHEW
81.0000 mg | CHEWABLE_TABLET | ORAL | Status: AC
  Administered 2020-10-30: 81 mg via ORAL
  Filled 2020-10-29: qty 1

## 2020-10-29 MED ORDER — SODIUM CHLORIDE 0.9 % IV SOLN: 250.0000 mL | INTRAVENOUS | Status: DC | PRN

## 2020-10-29 NOTE — Plan of Care (Signed)
  Problem: Education: Goal: Knowledge of General Education information will improve Description Including pain rating scale, medication(s)/side effects and non-pharmacologic comfort measures Outcome: Progressing   Problem: Health Behavior/Discharge Planning: Goal: Ability to manage health-related needs will improve Outcome: Progressing   

## 2020-10-29 NOTE — Progress Notes (Addendum)
Progress Note  Patient Name: [name] Date of Encounter: 10/29/2020  [facility] Cardiologist: Chrystie NoseKenneth C Hilty, MD   Subjective   No more CP/SOB overnight, has not been ambulating in the halls No sx from HR 50s Wears CPAP at night, had it on last pm  Inpatient Medications    Scheduled Meds:  amLODipine  10 mg Oral Daily   aspirin EC  81 mg Oral Daily   clopidogrel  75 mg Oral Daily   ezetimibe  10 mg Oral Daily   fenofibrate  160 mg Oral Daily   FLUoxetine  20 mg Oral Daily   heparin  5,000 Units Subcutaneous Q8H   icosapent Ethyl  1 g Oral BID   irbesartan  150 mg Oral Daily   isosorbide mononitrate  60 mg Oral Daily   metFORMIN  500 mg Oral Q breakfast   metoprolol succinate  12.5 mg Oral Daily   polyethylene glycol  17 g Oral Daily   simvastatin  20 mg Oral Daily   Continuous Infusions:  PRN Meds: acetaminophen, cyclobenzaprine, diazepam, ondansetron (ZOFRAN) IV   Vital Signs    Vitals:   10/28/20 2303 10/29/20 0020 10/29/20 0056 10/29/20 0452  BP:    (!) 155/53  Pulse: (!) 54 (!) 49 69 (!) 55  Resp:    16  Temp:    98 F (36.7 C)  TempSrc:    Oral  SpO2: 98% 96% 92% 100%  Weight:      Height:        Intake/Output Summary (Last 24 hours) at 10/29/2020 0741 Last data filed at 10/28/2020 2030 Gross per 24 hour  Intake 240 ml  Output --  Net 240 ml   Last 3 Weights 10/27/2020 10/24/2020 10/18/2020  Weight (lbs) 205 lb 3.2 oz 205 lb 205 lb 3.2 oz  Weight (kg) 93.078 kg 92.987 kg 93.078 kg      Telemetry    SR, sinus brady to high 40s - Personally Reviewed  ECG    None today  Physical Exam   General: Well developed, well nourished, female in no acute distress Head: Eyes PERRLA, Head normocephalic and atraumatic Lungs: clear bilaterally to auscultation. Heart: HRRR S1 S2, without rub or gallop. No murmur. 4/4 extremity pulses are 2+ & equal. No JVD. Abdomen: Bowel sounds are present, abdomen soft and non-tender without masses or  hernias  noted. Msk: Normal strength and tone for age. Extremities: No clubbing, cyanosis or edema.    Skin:  No rashes or lesions noted. Neuro: Alert and oriented X 3. Psych:  Good affect, responds appropriately   Labs    High Sensitivity Troponin:   Recent Labs  Lab 10/26/20 1619 10/26/20 2152 10/26/20 2352 10/28/20 1003 10/28/20 1245  TROPONINIHS 19* 18* 16 8 8       Chemistry Recent Labs  Lab 10/27/20 0955 10/28/20 0244 10/29/20 0226  NA 138 137 136  K 4.2 4.5 4.2  CL 105 107 103  CO2 22 24 26   GLUCOSE 151* 136* 155*  BUN 20 17 25*  CREATININE 1.06* 0.95 0.98  CALCIUM 9.4 9.3 9.9  GFRNONAA 56* >60 >60  ANIONGAP 11 6 7      Hematology Recent Labs  Lab 10/26/20 1426  WBC 11.5*  RBC 4.80  HGB 12.3  HCT 38.9  MCV 81.0  MCH 25.6*  MCHC 31.6  RDW 14.6  PLT 396      Radiology    ECHOCARDIOGRAM COMPLETE  Result Date: 10/27/2020  ECHOCARDIOGRAM REPORT   Patient Name:   [name] Date of Exam: 10/27/2020 Medical Rec #:  [id number]    Height:       61.0 in Accession #:    [id number]   Weight:       205.0 lb Date of Birth:  1950-08-19    BSA:          1.909 m Patient Age:    70 years     BP:           139/56 mmHg Patient Gender: F            HR:           63 bpm. Exam Location:  Inpatient Procedure: 2D Echo, Cardiac Doppler and Color Doppler Indications:    Chest pain  History:        Patient has prior history of Echocardiogram examinations, most                 recent 12/13/2019. CAD, Signs/Symptoms:Chest Pain; Risk                 Factors:Hypertension and Dyslipidemia. DOE.  Sonographer:    Ross Ludwig RDCS (AE) Referring Phys: [id number] Parke Poisson IMPRESSIONS  1. Left ventricular ejection fraction, by estimation, is 60 to 65%. The left ventricle has normal function. The left ventricle has no regional wall motion abnormalities. There is moderate asymmetric left ventricular hypertrophy. Left ventricular diastolic parameters were normal.  2. Right ventricular systolic  function is normal. The right ventricular size is normal.  3. The mitral valve is normal in structure. Trivial mitral valve regurgitation.  4. The aortic valve is tricuspid. There is mild thickening of the aortic valve. Aortic valve regurgitation is not visualized. No aortic stenosis is present. FINDINGS  Left Ventricle: Left ventricular ejection fraction, by estimation, is 60 to 65%. The left ventricle has normal function. The left ventricle has no regional wall motion abnormalities. The left ventricular internal cavity size was small. There is moderate  asymmetric left ventricular hypertrophy. Left ventricular diastolic parameters were normal. Right Ventricle: The right ventricular size is normal. Right vetricular wall thickness was not well visualized. Right ventricular systolic function is normal. Left Atrium: Left atrial size was normal in size. Right Atrium: Right atrial size was normal in size. Pericardium: There is no evidence of pericardial effusion. Mitral Valve: The mitral valve is normal in structure. Trivial mitral valve regurgitation. MV peak gradient, 4.9 mmHg. The mean mitral valve gradient is 2.0 mmHg. Tricuspid Valve: The tricuspid valve is normal in structure. Tricuspid valve regurgitation is trivial. Aortic Valve: The aortic valve is tricuspid. There is mild thickening of the aortic valve. Aortic valve regurgitation is not visualized. No aortic stenosis is present. Aortic valve mean gradient measures 5.0 mmHg. Aortic valve peak gradient measures 8.4 mmHg. Aortic valve area, by VTI measures 3.08 cm. Pulmonic Valve: The pulmonic valve was normal in structure. Pulmonic valve regurgitation is not visualized. Aorta: The aortic root and ascending aorta are structurally normal, with no evidence of dilitation. IAS/Shunts: The atrial septum is grossly normal.  LEFT VENTRICLE PLAX 2D LVIDd:         4.20 cm  Diastology LVIDs:         2.20 cm  LV e' medial:    6.31 cm/s LV PW:         1.40 cm  LV E/e'  medial:  13.3 LV IVS:        1.50 cm  LV e' lateral:   9.03 cm/s LVOT diam:     2.00 cm  LV E/e' lateral: 9.3 LV SV:         83 LV SV Index:   43 LVOT Area:     3.14 cm  RIGHT VENTRICLE             IVC RV Basal diam:  3.00 cm     IVC diam: 1.50 cm RV S prime:     14.00 cm/s TAPSE (M-mode): 2.5 cm LEFT ATRIUM             Index       RIGHT ATRIUM           Index LA diam:        3.40 cm 1.78 cm/m  RA Area:     12.70 cm LA Vol (A2C):   41.0 ml 21.48 ml/m RA Volume:   28.80 ml  15.09 ml/m LA Vol (A4C):   46.4 ml 24.31 ml/m LA Biplane Vol: 45.9 ml 24.05 ml/m  AORTIC VALVE AV Area (Vmax):    2.71 cm AV Area (Vmean):   2.70 cm AV Area (VTI):     3.08 cm AV Vmax:           145.00 cm/s AV Vmean:          99.300 cm/s AV VTI:            0.268 m AV Peak Grad:      8.4 mmHg AV Mean Grad:      5.0 mmHg LVOT Vmax:         125.00 cm/s LVOT Vmean:        85.500 cm/s LVOT VTI:          0.263 m LVOT/AV VTI ratio: 0.98  AORTA Ao Root diam: 3.10 cm Ao Asc diam:  3.00 cm MITRAL VALVE MV Area (PHT): 2.22 cm     SHUNTS MV Area VTI:   2.66 cm     Systemic VTI:  0.26 m MV Peak grad:  4.9 mmHg     Systemic Diam: 2.00 cm MV Mean grad:  2.0 mmHg MV Vmax:       1.11 m/s MV Vmean:      61.8 cm/s MV Decel Time: 342 msec MV E velocity: 83.80 cm/s MV A velocity: 103.00 cm/s MV E/A ratio:  0.81 Kristeen Miss MD Electronically signed by Kristeen Miss MD Signature Date/Time: 10/27/2020/10:46:53 AM    Final     Cardiac Studies   Echocardiogram   1. Left ventricular ejection fraction, by estimation, is 60 to 65%. The  left ventricle has normal function. The left ventricle has no regional  wall motion abnormalities. There is moderate asymmetric left ventricular hypertrophy. Left ventricular diastolic parameters were normal.   2. Right ventricular systolic function is normal. The right ventricular  size is normal.   3. The mitral valve is normal in structure. Trivial mitral valve  regurgitation.   4. The aortic valve is tricuspid.  There is mild thickening of the aortic  valve. Aortic valve regurgitation is not visualized. No aortic stenosis is present.   Patient Profile     70 y.o. female with history of CAD, hypertension, hyperlipidemia and anxiety seen for chest pain.  Assessment & Plan    1.  Chest pain - recurrent despite med rx and recent neg testing - ez neg MI - cath Monday - if cath ok, continue Imdur for possible small vessel dz, may also benefit  from pulm rehab or CPX testing to assess her DOE  2.  Hypertension - BP 98/46 last pm, but SBP generally > 130 - continue low-dose BB, Imdur 60 mg qd, amlodipine 10 mg qd, irbesartan 150 mg qd - may need to change the amlodipine to bedtime  3.  Chronic diastolic dysfunction - no S&S of volume overload - no diuretic needed for now  4. AKI - gentle hydration for cath  5.  Diabetes mellitus - add SSI, d/c metformin till after cath  6. HLD,  08/17/2020: Cholesterol, Total 169; HDL 29; LDL Chol Calc (NIH) 57; Triglycerides 551  - continue current rx, LDL 57  7. ?UTI - Pt feels urine ok now  8. Constipation - cont Miralax  9. Anxiety - on home rx - encouraged her to discuss w/ PCP at f/u  For questions or updates, please contact [facility] Please consult [email or web address] for contact info under    Signed, Theodore Demark, PA-C  10/29/2020, 7:41 AM     Patient seen and examined with Theodore Demark PA-C.  Agree as above, with the following exceptions and changes as noted below. Feeling much better today. Gen: NAD, CV: RRR, no murmurs, Lungs: clear, Abd: soft, Extrem: Warm, well perfused, no edema, Neuro/Psych: alert and oriented x 3, normal mood and affect. All available labs, radiology testing, previous records reviewed. Still strongly suspect this is hypertension driven causing angina from small vessel disease/microvascular angina. Would like to use spironolactone when renal function stabilizes. Tentatively plan for [facility] tomorrow to exclude progressive  CAD, if normal can go home tomorrow and will titrate meds further as outpatient.   Parke Poisson, MD 10/29/20 10:29 AM

## 2020-10-30 ENCOUNTER — Other Ambulatory Visit (HOSPITAL_COMMUNITY): Payer: Self-pay

## 2020-10-30 ENCOUNTER — Encounter (HOSPITAL_COMMUNITY)
Admission: EM | Disposition: A | Discharge status: Home / Self Care | Payer: Self-pay | Source: Home / Self Care | Attending: Internal Medicine

## 2020-10-30 ENCOUNTER — Encounter: Payer: Self-pay | Admitting: Family Medicine

## 2020-10-30 ENCOUNTER — Telehealth: Payer: Self-pay

## 2020-10-30 ENCOUNTER — Encounter (HOSPITAL_COMMUNITY): Payer: Self-pay | Admitting: Cardiovascular Disease

## 2020-10-30 DIAGNOSIS — R079 Chest pain, unspecified: Secondary | ICD-10-CM

## 2020-10-30 DIAGNOSIS — N179 Acute kidney failure, unspecified: Secondary | ICD-10-CM

## 2020-10-30 HISTORY — PX: LEFT HEART CATH AND CORONARY ANGIOGRAPHY: CATH118249

## 2020-10-30 LAB — GLUCOSE, CAPILLARY
Glucose-Capillary: 152 mg/dL — ABNORMAL HIGH (ref 70–99)
Glucose-Capillary: 122 mg/dL — ABNORMAL HIGH (ref 70–99)
Glucose-Capillary: 169 mg/dL — ABNORMAL HIGH (ref 70–99)

## 2020-10-30 LAB — BASIC METABOLIC PANEL
Anion gap: 7 (ref 5–15)
BUN: 28 mg/dL — ABNORMAL HIGH (ref 8–23)
CO2: 26 mmol/L (ref 22–32)
Calcium: 9.8 mg/dL (ref 8.9–10.3)
Chloride: 101 mmol/L (ref 98–111)
Creatinine, Ser: 1.24 mg/dL — ABNORMAL HIGH (ref 0.44–1.00)
GFR, Estimated: 47 mL/min — ABNORMAL LOW (ref >60)
Glucose, Bld: 153 mg/dL — ABNORMAL HIGH (ref 70–99)
Potassium: 4 mmol/L (ref 3.5–5.1)
Sodium: 134 mmol/L — ABNORMAL LOW (ref 135–145)

## 2020-10-30 SURGERY — LEFT HEART CATH AND CORONARY ANGIOGRAPHY
Anesthesia: LOCAL

## 2020-10-30 MED ORDER — SODIUM CHLORIDE 0.9 % IV SOLN: 250.0000 mL | INTRAVENOUS | Status: DC | PRN

## 2020-10-30 MED ORDER — HYDRALAZINE HCL 20 MG/ML IJ SOLN: 10.0000 mg | INTRAMUSCULAR | Status: AC | PRN

## 2020-10-30 MED ORDER — SODIUM CHLORIDE 0.9% FLUSH: 3.0000 mL | Freq: Two times a day (BID) | INTRAVENOUS | Status: DC

## 2020-10-30 MED ORDER — MIDAZOLAM HCL 2 MG/2ML IJ SOLN
INTRAMUSCULAR | Status: DC | PRN
  Administered 2020-10-30: 1 mg via INTRAVENOUS

## 2020-10-30 MED ORDER — HEPARIN (PORCINE) IN NACL 1000-0.9 UT/500ML-% IV SOLN
INTRAVENOUS | Status: DC | PRN
Start: 2020-10-30 — End: 2020-10-30
  Administered 2020-10-30: 500 mL

## 2020-10-30 MED ORDER — ASPIRIN 81 MG PO CHEW: 81.0000 mg | CHEWABLE_TABLET | Freq: Every day | ORAL | Status: DC

## 2020-10-30 MED ORDER — MORPHINE SULFATE (PF) 2 MG/ML IV SOLN: 2.0000 mg | INTRAVENOUS | Status: DC | PRN

## 2020-10-30 MED ORDER — HEPARIN SODIUM (PORCINE) 1000 UNIT/ML IJ SOLN
INTRAMUSCULAR | Status: AC
  Filled 2020-10-30: qty 1

## 2020-10-30 MED ORDER — LIDOCAINE HCL (PF) 1 % IJ SOLN
INTRAMUSCULAR | Status: AC
  Filled 2020-10-30: qty 30

## 2020-10-30 MED ORDER — FENTANYL CITRATE (PF) 100 MCG/2ML IJ SOLN
INTRAMUSCULAR | Status: DC | PRN
  Administered 2020-10-30: 25 ug via INTRAVENOUS

## 2020-10-30 MED ORDER — VERAPAMIL HCL 2.5 MG/ML IV SOLN
INTRA_ARTERIAL | Status: DC | PRN
  Administered 2020-10-30: 10 mL via INTRA_ARTERIAL

## 2020-10-30 MED ORDER — SODIUM CHLORIDE 0.9% FLUSH: 3.0000 mL | INTRAVENOUS | Status: DC | PRN

## 2020-10-30 MED ORDER — HEPARIN (PORCINE) IN NACL 1000-0.9 UT/500ML-% IV SOLN
INTRAVENOUS | Status: AC
  Filled 2020-10-30: qty 1000

## 2020-10-30 MED ORDER — MIDAZOLAM HCL 2 MG/2ML IJ SOLN
INTRAMUSCULAR | Status: AC
  Filled 2020-10-30: qty 2

## 2020-10-30 MED ORDER — ONDANSETRON HCL 4 MG/2ML IJ SOLN: 4.0000 mg | Freq: Four times a day (QID) | INTRAMUSCULAR | Status: DC | PRN

## 2020-10-30 MED ORDER — HEPARIN SODIUM (PORCINE) 1000 UNIT/ML IJ SOLN
INTRAMUSCULAR | Status: DC | PRN
  Administered 2020-10-30: 4500 [IU] via INTRAVENOUS

## 2020-10-30 MED ORDER — FENTANYL CITRATE (PF) 100 MCG/2ML IJ SOLN
INTRAMUSCULAR | Status: AC
  Filled 2020-10-30: qty 2

## 2020-10-30 MED ORDER — LIDOCAINE HCL (PF) 1 % IJ SOLN
INTRAMUSCULAR | Status: DC | PRN
  Administered 2020-10-30: 2 mL

## 2020-10-30 MED ORDER — AMLODIPINE BESYLATE 10 MG PO TABS
10.0000 mg | ORAL_TABLET | Freq: Every day | ORAL | 6 refills | Status: DC
  Filled 2020-10-30: qty 30, 30d supply, fill #0
  Filled 2020-12-04: qty 30, 30d supply, fill #1
  Filled 2021-01-08 – 2021-01-12 (×2): qty 30, 30d supply, fill #2
  Filled 2021-02-08: qty 30, 30d supply, fill #3
  Filled 2021-03-21: qty 30, 30d supply, fill #4
  Filled 2021-03-21: qty 30, 30d supply, fill #0
  Filled 2021-05-07: qty 30, 30d supply, fill #1

## 2020-10-30 MED ORDER — LABETALOL HCL 5 MG/ML IV SOLN: 10.0000 mg | INTRAVENOUS | Status: AC | PRN

## 2020-10-30 MED ORDER — HEPARIN (PORCINE) IN NACL 1000-0.9 UT/500ML-% IV SOLN
INTRAVENOUS | Status: DC | PRN
  Administered 2020-10-30: 500 mL

## 2020-10-30 MED ORDER — NITROGLYCERIN 1 MG/10 ML FOR IR/CATH LAB
INTRA_ARTERIAL | Status: AC
  Filled 2020-10-30: qty 10

## 2020-10-30 MED ORDER — ACETAMINOPHEN 325 MG PO TABS: 650.0000 mg | ORAL_TABLET | ORAL | Status: DC | PRN

## 2020-10-30 MED ORDER — SODIUM CHLORIDE 0.9 % IV SOLN
INTRAVENOUS | Status: DC
  Administered 2020-10-30: 1000 mL via INTRAVENOUS

## 2020-10-30 MED ORDER — VERAPAMIL HCL 2.5 MG/ML IV SOLN
INTRAVENOUS | Status: AC
  Filled 2020-10-30: qty 2

## 2020-10-30 SURGICAL SUPPLY — 11 items

## 2020-10-30 NOTE — Discharge Instructions (Signed)
No driving for 48 hours. No lifting over 5 lbs for 1 week. No sexual activity for 1 week. You may return to work on 11/06/20. Keep procedure site clean & dry. If you notice increased pain, swelling, bleeding or pus, call/return!  You may shower, but no soaking baths/hot tubs/pools for 1 week.   Hold metformin >> resume on Thursday   GET BMP (kidney function check) during PCP visit within 1 week. Call office if needs to arrange with [place]. Limit salt intake.   Keep tract of Blood pressure and bring during office visit.

## 2020-10-30 NOTE — Plan of Care (Signed)

## 2020-10-30 NOTE — Telephone Encounter (Signed)
Pt's daughter, [name], called today and stated pt is going to be released from the hospital today.  She reported there is no damage to patient's heart; however, the doctor at the hospital is wanting the pt to be seen by Pulmonology ASAP.

## 2020-10-30 NOTE — Telephone Encounter (Signed)
Is the referral for Pulm appropriate or is that something the hospital needed to do before discharge?

## 2020-10-30 NOTE — Interval H&P Note (Signed)
History and Physical Interval Note:  8/8/2022Cath Lab Visit (complete for each Cath Lab visit)  Clinical Evaluation Leading to the Procedure:   ACS: No.  Non-ACS:    Anginal Classification: CCS II  Anti-ischemic medical therapy: Maximal Therapy (2 or more classes of medications)  Non-Invasive Test Results: Low-risk stress test findings: cardiac mortality <1%/year  Prior CABG: No previous CABG       10:55 AM  [name]  has presented today for surgery, with the diagnosis of chest pain.  The various methods of treatment have been discussed with the patient and family. After consideration of risks, benefits and other options for treatment, the patient has consented to  Procedure(s): LEFT HEART CATH AND CORONARY ANGIOGRAPHY (N/A) as a surgical intervention.  The patient's history has been reviewed, patient examined, no change in status, stable for surgery.  I have reviewed the patient's chart and labs.  Questions were answered to the patient's satisfaction.     Nanetta Batty

## 2020-10-30 NOTE — Discharge Summary (Addendum)
Discharge Summary    Patient ID: [name] MRN: [id number]; DOB: 1950/04/25  Admit date: 10/26/2020 Discharge date: 10/30/2020  PCP:  Pearline Cables, MD   [facility] Providers Cardiologist:  Chrystie Nose, MD   {   Discharge Diagnoses    Principal Problem:   Chest pain Active Problems:   Essential hypertension   Hyperlipidemia with target LDL less than 70   Obesity, morbid, BMI 40.0-49.9 (HCC)   DM (diabetes mellitus) with complications (HCC)   AKI (acute kidney injury) (HCC)  Diagnostic Studies/Procedures    LEFT HEART CATH AND CORONARY ANGIOGRAPHY 10/30/20 IMPRESSION: [name]'s  proximal LAD stent is widely patent as it was a year ago.  She has no other obstructive disease.  LV function is normal by 2D echo.  I believe her chest pain is noncardiac.  The sheath was removed and a TR band was placed on the right wrist to achieve patent hemostasis.  The patient left the lab in stable condition.  Dr. Clifton James was made aware of these results.  Anticipate discharge later today. Hemo Data  Flowsheet Row Most Recent Value  Aortic Mean Gradient 18.81 mmHg  Aortic Peak Gradient 13 mmHg  AO Systolic Pressure 147 mmHg  AO Diastolic Pressure 50 mmHg  AO Mean 82 mmHg  LV Systolic Pressure 136 mmHg  LV Diastolic Pressure 0 mmHg  LV EDP 8 mmHg  AOp Systolic Pressure 144 mmHg  AOp Diastolic Pressure 41 mmHg  AOp Mean Pressure 77 mmHg  LVp Systolic Pressure 157 mmHg  LVp Diastolic Pressure 0 mmHg  LVp EDP Pressure 12 mmHg     Diagnostic Dominance: Right     Echo 10/27/20  1. Left ventricular ejection fraction, by estimation, is 60 to 65%. The  left ventricle has normal function. The left ventricle has no regional  wall motion abnormalities. There is moderate asymmetric left ventricular  hypertrophy. Left ventricular  diastolic parameters were normal.   2. Right ventricular systolic function is normal. The right ventricular  size is normal.   3. The mitral valve is  normal in structure. Trivial mitral valve  regurgitation.   4. The aortic valve is tricuspid. There is mild thickening of the aortic  valve. Aortic valve regurgitation is not visualized. No aortic stenosis is   History of Present Illness     BROOKELYNN Simpson is a 70 y.o. female with hx of  CAD, HTN, anxiety, HLD, who is being seen 10/26/2020 for the evaluation of Chest pain.  Patient notes that more recently over the last few weeks she has had exertional shortness of breath and chest pain. Pain radiates to left side of the neck. Substernal chest pressure and tightness.  Lexiscan Myoview was performed on 10/24/2020.  It was a low risk stress test with an EF of 68%, no ischemia and possible small area of infarction anteriorly vs breast attenuation on my independent review of images.  Upon reviewing these results, [name] advised [name] that her chest pain was not heart related.  However, came to ER for recurrent pain.   Hospital Course     Consultants: None  1.  Chest pain -Troponin negative.  EKG without acute ischemic changes.  Recent outpatient stress test was low risk . study. Per Dr. Jacques Navy "  It was a low risk stress test with an EF of 68%, no ischemia and possible small area of infarction anteriorly vs breast attenuation on my independent review of images". Suspected her symptoms related to  hypertensive episode and diastolic dysfunction.  Patient was seen by Dr. Allyson Sabal on Friday and felt medical therapy for appropriate.Her nitroglycerin drip stopped and  Increased Imdur to 60 mg daily next day. However, patient had recurrent chest pain and shortness of breath With Minimal Activity in the Room. Patient underwent cath Monday showing patent LAD stent and no other dz. Felt non cardiac pain. Recommended to follow up with PCP. LVEDP was normal by cath.    2.  Hypertension - Elevated BP on arrival. Amlodipine added and titrated. BP established on current medications.  - Advised to keep log of  blood pressure and use low sodium diet.   3. AKI - 1.26 on admit which resulted but again Scr bump to 1.24 again without change in meds. - Get BMP during PCP visit in next week   4.  Diabetes mellitus - metformin on hold for cath    5. HLD - 08/17/2020: Cholesterol, Total 169; HDL 29; LDL Chol Calc (NIH) 57; Triglycerides 551  -Continue Zocor, Vascepa and Zetia    Did the patient have an acute coronary syndrome (MI, NSTEMI, STEMI, etc) this admission?:  No                               Did the patient have a percutaneous coronary intervention (stent / angioplasty)?:  No.    Discharge Vitals Blood pressure (!) 123/50, pulse (!) 58, temperature 98.5 F (36.9 C), temperature source Oral, resp. rate 18, height 5\' 1"  (1.549 m), weight 93.1 kg, SpO2 98 %.  Filed Weights   10/27/20 1519  Weight: 93.1 kg    Labs & Radiologic Studies     Basic Metabolic Panel Recent Labs    12/27/20 0226 10/30/20 0042  NA 136 134*  K 4.2 4.0  CL 103 101  CO2 26 26  GLUCOSE 155* 153*  BUN 25* 28*  CREATININE 0.98 1.24*  CALCIUM 9.9 9.8    High Sensitivity Troponin:   Recent Labs  Lab 10/26/20 1619 10/26/20 2152 10/26/20 2352 10/28/20 1003 10/28/20 1245  TROPONINIHS 19* 18* 16 8 8      Hemoglobin A1C Recent Labs    10/29/20 0226  HGBA1C 7.3*    _____________  DG Chest 2 View  Result Date: 10/26/2020 CLINICAL DATA:  Chest pain EXAM: CHEST - 2 VIEW COMPARISON:  04/03/2020 FINDINGS: The heart size and mediastinal contours are within normal limits. Both lungs are clear. The visualized skeletal structures are unremarkable. IMPRESSION: No acute abnormality of the lungs in AP portable projection. Electronically Signed   By: 12/26/2020 M.D.   On: 10/26/2020 15:07   CARDIAC CATHETERIZATION  Result Date: 10/30/2020 Formatting of this result is different from the original. Images from the original result were not included.   Previously placed Prox LAD to Mid LAD stent (unknown type) is   widely patent. [name] is a 70 y.o. female  Brynda Peon LOCATION:  FACILITY: [facility] PHYSICIAN: 61, M.D. Jul 05, 1950 DATE OF PROCEDURE:  10/30/2020 DATE OF DISCHARGE: CARDIAC CATHETERIZATION History obtained from chart review.70 y.o. female with history of CAD, hypertension, hyperlipidemia and anxiety seen for chest pain.  She had proximal LAD stenting by myself approximately a dozen years ago.  She underwent diagnostic coronary angiography by Dr. 12/30/2020 a year ago revealing a widely patent LAD stent with no other significant CAD.  She was admitted with chest pain ruled out for myocardial infarction.  Recent Myoview performed on  10/24/2020 was nonischemic.  Because of recurrent chest pain she was referred for diagnostic coronary angiography.   Ms. Dorey's  proximal LAD stent is widely patent as it was a year ago.  She has no other obstructive disease.  LV function is normal by 2D echo.  I believe her chest pain is noncardiac.  The sheath was removed and a TR band was placed on the right wrist to achieve patent hemostasis.  The patient left the lab in stable condition.  Dr. Clifton James was made aware of these results.  Anticipate discharge later today. Nanetta Batty. MD, [facility] 10/30/2020 11:33 AM    MYOCARDIAL PERFUSION IMAGING  Result Date: 10/24/2020  Nuclear stress EF: 68%. The left ventricular ejection fraction is hyperdynamic (>65%).  There was no ST segment deviation noted during stress.  This is a low risk study. There is no evidence of ischemia and no evidence of previous infarction.  The study is normal.    ECHOCARDIOGRAM COMPLETE  Result Date: 10/27/2020    ECHOCARDIOGRAM REPORT   Patient Name:   TANAY MASSIAH Date of Exam: 10/27/2020 Medical Rec #:  [id number]    Height:       61.0 in Accession #:    [id number]   Weight:       205.0 lb Date of Birth:  September 02, 1950    BSA:          1.909 m Patient Age:    70 years     BP:           139/56 mmHg Patient Gender: F            HR:           63 bpm. Exam  Location:  Inpatient Procedure: 2D Echo, Cardiac Doppler and Color Doppler Indications:    Chest pain  History:        Patient has prior history of Echocardiogram examinations, most                 recent 12/13/2019. CAD, Signs/Symptoms:Chest Pain; Risk                 Factors:Hypertension and Dyslipidemia. DOE.  Sonographer:    Ross Ludwig RDCS (AE) Referring Phys: [id number] Parke Poisson IMPRESSIONS  1. Left ventricular ejection fraction, by estimation, is 60 to 65%. The left ventricle has normal function. The left ventricle has no regional wall motion abnormalities. There is moderate asymmetric left ventricular hypertrophy. Left ventricular diastolic parameters were normal.  2. Right ventricular systolic function is normal. The right ventricular size is normal.  3. The mitral valve is normal in structure. Trivial mitral valve regurgitation.  4. The aortic valve is tricuspid. There is mild thickening of the aortic valve. Aortic valve regurgitation is not visualized. No aortic stenosis is present. FINDINGS  Left Ventricle: Left ventricular ejection fraction, by estimation, is 60 to 65%. The left ventricle has normal function. The left ventricle has no regional wall motion abnormalities. The left ventricular internal cavity size was small. There is moderate  asymmetric left ventricular hypertrophy. Left ventricular diastolic parameters were normal. Right Ventricle: The right ventricular size is normal. Right vetricular wall thickness was not well visualized. Right ventricular systolic function is normal. Left Atrium: Left atrial size was normal in size. Right Atrium: Right atrial size was normal in size. Pericardium: There is no evidence of pericardial effusion. Mitral Valve: The mitral valve is normal in structure. Trivial mitral valve regurgitation. MV peak gradient, 4.9 mmHg. The mean  mitral valve gradient is 2.0 mmHg. Tricuspid Valve: The tricuspid valve is normal in structure. Tricuspid valve regurgitation is  trivial. Aortic Valve: The aortic valve is tricuspid. There is mild thickening of the aortic valve. Aortic valve regurgitation is not visualized. No aortic stenosis is present. Aortic valve mean gradient measures 5.0 mmHg. Aortic valve peak gradient measures 8.4 mmHg. Aortic valve area, by VTI measures 3.08 cm. Pulmonic Valve: The pulmonic valve was normal in structure. Pulmonic valve regurgitation is not visualized. Aorta: The aortic root and ascending aorta are structurally normal, with no evidence of dilitation. IAS/Shunts: The atrial septum is grossly normal.  LEFT VENTRICLE PLAX 2D LVIDd:         4.20 cm  Diastology LVIDs:         2.20 cm  LV e' medial:    6.31 cm/s LV PW:         1.40 cm  LV E/e' medial:  13.3 LV IVS:        1.50 cm  LV e' lateral:   9.03 cm/s LVOT diam:     2.00 cm  LV E/e' lateral: 9.3 LV SV:         83 LV SV Index:   43 LVOT Area:     3.14 cm  RIGHT VENTRICLE             IVC RV Basal diam:  3.00 cm     IVC diam: 1.50 cm RV S prime:     14.00 cm/s TAPSE (M-mode): 2.5 cm LEFT ATRIUM             Index       RIGHT ATRIUM           Index LA diam:        3.40 cm 1.78 cm/m  RA Area:     12.70 cm LA Vol (A2C):   41.0 ml 21.48 ml/m RA Volume:   28.80 ml  15.09 ml/m LA Vol (A4C):   46.4 ml 24.31 ml/m LA Biplane Vol: 45.9 ml 24.05 ml/m  AORTIC VALVE AV Area (Vmax):    2.71 cm AV Area (Vmean):   2.70 cm AV Area (VTI):     3.08 cm AV Vmax:           145.00 cm/s AV Vmean:          99.300 cm/s AV VTI:            0.268 m AV Peak Grad:      8.4 mmHg AV Mean Grad:      5.0 mmHg LVOT Vmax:         125.00 cm/s LVOT Vmean:        85.500 cm/s LVOT VTI:          0.263 m LVOT/AV VTI ratio: 0.98  AORTA Ao Root diam: 3.10 cm Ao Asc diam:  3.00 cm MITRAL VALVE MV Area (PHT): 2.22 cm     SHUNTS MV Area VTI:   2.66 cm     Systemic VTI:  0.26 m MV Peak grad:  4.9 mmHg     Systemic Diam: 2.00 cm MV Mean grad:  2.0 mmHg MV Vmax:       1.11 m/s MV Vmean:      61.8 cm/s MV Decel Time: 342 msec MV E velocity:  83.80 cm/s MV A velocity: 103.00 cm/s MV E/A ratio:  0.81 Kristeen Miss MD Electronically signed by Kristeen Miss MD Signature Date/Time: 10/27/2020/10:46:53 AM    Final  Disposition   Pt is being discharged home today in good condition.  Follow-up Plans & Appointments     Follow-up Information     Copland, Gwenlyn FoundJessica C, MD. Schedule an appointment as soon as possible for a visit in 1 week(s).   Specialty: Family Medicine Why: for hospital follow up and kidney function check Contact information: [address] [place] [address] [phone]         Chrystie NoseHilty, Kenneth C, MD Follow up.   Specialty: Cardiology Why: as scheduled in september Contact information: [address] [place] [address] [phone]                Discharge Instructions     Diet - low sodium heart healthy   Complete by: As directed    Discharge instructions   Complete by: As directed    No driving for 48 hours. No lifting over 5 lbs for 1 week. No sexual activity for 1 week. You may return to work on 11/06/20. Keep procedure site clean & dry. If you notice increased pain, swelling, bleeding or pus, call/return!  You may shower, but no soaking baths/hot tubs/pools for 1 week.   Hold metformin >> resume on Thursday   GET BMP (kidney function check) during PCP visit within 1 week. Call office if needs to arrange with us. Limit salt intake.   Keep tract of Blood pressure and bring during office visit.   Increase activity slowly   Complete by: As directed        Discharge Medications   Allergies as of 10/30/2020       Reactions   Crestor [rosuvastatin] Other (See Comments)   myalgia   Niacin And Related Other (See Comments)   Whelps and skin flushed, mouth tingling        Medication List     TAKE these medications    alendronate 70 MG tablet Commonly known as: FOSAMAX TAKE 1 TABLET BY MOUTH EVERY 7 (SEVEN) DAYS. TAKE WITH A FULL GLASS OF WATER ON AN EMPTY  STOMACH.   amLODipine 10 MG tablet Commonly known as: NORVASC Take 1 tablet (10 mg total) by mouth daily. Start taking on: Simpson 9, 2022   aspirin 81 MG EC tablet Take 1 tablet (81 mg total) by mouth daily.   Calcium Carbonate 260 MG Chew Chew 1 tablet by mouth daily.   cetirizine 10 MG chewable tablet Commonly known as: ZYRTEC Chew 10 mg by mouth daily as needed for allergies.   cholecalciferol 1000 units tablet Commonly known as: VITAMIN D Take 1,000 Units by mouth daily.   clopidogrel 75 MG tablet Commonly known as: PLAVIX TAKE 1 TABLET BY MOUTH DAILY.   cyclobenzaprine 10 MG tablet Commonly known as: FLEXERIL TAKE 1 TABLET BY MOUTH TWICE DAILY AS NEEDED FOR MUSCLE SPASMS   diazepam 2 MG tablet Commonly known as: VALIUM TAKE 1 TABLET BY MOUTH EVERY 8 HOURS AS NEEDED FOR MUSCLE SPASMS   ezetimibe 10 MG tablet Commonly known as: ZETIA TAKE 1 TABLET BY MOUTH ONCE A DAY   fenofibrate 145 MG tablet Commonly known as: TRICOR Take 1 tablet (145 mg total) by mouth daily.   FLUoxetine 20 MG capsule Commonly known as: PROZAC TAKE 1 CAPSULE BY MOUTH ONCE DAILY   fluticasone 50 MCG/ACT nasal spray Commonly known as: FLONASE Place 1 spray into both nostrils daily as needed for allergies or rhinitis.   isosorbide mononitrate 30 MG 24 hr tablet Commonly known as: IMDUR Take 1 tablet (  30 mg total) by mouth daily.   metFORMIN 500 MG 24 hr tablet Commonly known as: GLUCOPHAGE-XR Take 1 tablet (500 mg total) by mouth daily with breakfast.   metoprolol succinate 25 MG 24 hr tablet Commonly known as: TOPROL-XL TAKE 1/2 OF A TABLET BY MOUTH DAILY.   multivitamin with minerals Tabs tablet Take 1 tablet by mouth daily.   nitroGLYCERIN 0.4 MG SL tablet Commonly known as: NITROSTAT Place 1 tablet (0.4 mg total) under the tongue every 5 (five) minutes as needed for chest pain.   simvastatin 40 MG tablet Commonly known as: ZOCOR TAKE 1 TABLET BY MOUTH DAILY.    telmisartan 40 MG tablet Commonly known as: MICARDIS TAKE 1 TABLET BY MOUTH EVERY MORNING * NEEDS OFFICE VISIT   Vascepa 1 g capsule Generic drug: icosapent Ethyl TAKE 2 CAPSULES BY MOUTH TWICE DAILY What changed: how much to take           Outstanding Labs/Studies   BMP in one week with PCP.   Duration of Discharge Encounter   Greater than 30 minutes including physician time.  SignedSharrell Ku [place], PA 10/30/2020, 2:03 PM  I have personally seen and examined this patient. I agree with the assessment and plan as outlined above.  Cath with stable CAD. Her pain is not felt to be cardiac. D/C home today on her same meds. See my full note this am.   Tedra Senegal 10/30/2020 2:19 PM

## 2020-10-30 NOTE — H&P (View-Only) (Signed)
 Progress Note  Patient Name: [name] Date of Encounter: 10/30/2020  [facility] Cardiologist: Kenneth C Hilty, MD   Subjective   Feeling well. No chest pain, sob or palpitations.  For cath today.   Inpatient Medications    Scheduled Meds:  amLODipine  10 mg Oral Daily   aspirin EC  81 mg Oral Daily   clopidogrel  75 mg Oral Daily   ezetimibe  10 mg Oral Daily   fenofibrate  160 mg Oral Daily   FLUoxetine  20 mg Oral Daily   heparin  5,000 Units Subcutaneous Q8H   icosapent Ethyl  1 g Oral BID   insulin aspart  0-15 Units Subcutaneous TID WC   insulin aspart  0-5 Units Subcutaneous QHS   irbesartan  150 mg Oral Daily   isosorbide mononitrate  60 mg Oral Daily   metoprolol succinate  12.5 mg Oral Daily   polyethylene glycol  17 g Oral Daily   simvastatin  20 mg Oral Daily   sodium chloride flush  3 mL Intravenous Q12H   Continuous Infusions:  sodium chloride     sodium chloride 75 mL/hr at 10/30/20 0634   PRN Meds: sodium chloride, acetaminophen, cyclobenzaprine, diazepam, ondansetron (ZOFRAN) IV, sodium chloride flush   Vital Signs    Vitals:   10/29/20 2017 10/29/20 2303 10/30/20 0434 10/30/20 0848  BP: (!) 127/52  (!) 142/71 (!) 136/95  Pulse: 62  (!) 55 (!) 55  Resp: 17  16   Temp: 97.8 F (36.6 C)  97.8 F (36.6 C)   TempSrc: Oral  Oral   SpO2: 98% 98% 98%   Weight:      Height:       No intake or output data in the 24 hours ending 10/30/20 0941 Last 3 Weights 10/27/2020 10/24/2020 10/18/2020  Weight (lbs) 205 lb 3.2 oz 205 lb 205 lb 3.2 oz  Weight (kg) 93.078 kg 92.987 kg 93.078 kg      Telemetry    Bradycardia/rhythm- Personally Reviewed  ECG    No new tracing  Physical Exam   GEN: No acute distress.   Neck: No JVD Cardiac: RRR, no murmurs, rubs, or gallops.  Respiratory: Clear to auscultation bilaterally. GI: Soft, nontender, non-distended  MS: No edema; No deformity. Neuro:  Nonfocal  Psych: Normal affect   Labs    High  Sensitivity Troponin:   Recent Labs  Lab 10/26/20 1619 10/26/20 2152 10/26/20 2352 10/28/20 1003 10/28/20 1245  TROPONINIHS 19* 18* 16 8 8      Chemistry Recent Labs  Lab 10/28/20 0244 10/29/20 0226 10/30/20 0042  NA 137 136 134*  K 4.5 4.2 4.0  CL 107 103 101  CO2 24 26 26  GLUCOSE 136* 155* 153*  BUN 17 25* 28*  CREATININE 0.95 0.98 1.24*  CALCIUM 9.3 9.9 9.8  GFRNONAA >60 >60 47*  ANIONGAP 6 7 7     Hematology Recent Labs  Lab 10/26/20 1426  WBC 11.5*  RBC 4.80  HGB 12.3  HCT 38.9  MCV 81.0  MCH 25.6*  MCHC 31.6  RDW 14.6  PLT 396      Radiology    No results found.  Cardiac Studies   Echo 10/27/20  1. Left ventricular ejection fraction, by estimation, is 60 to 65%. The  left ventricle has normal function. The left ventricle has no regional  wall motion abnormalities. There is moderate asymmetric left ventricular  hypertrophy. Left ventricular  diastolic parameters were normal.     2. Right ventricular systolic function is normal. The right ventricular  size is normal.   3. The mitral valve is normal in structure. Trivial mitral valve  regurgitation.   4. The aortic valve is tricuspid. There is mild thickening of the aortic  valve. Aortic valve regurgitation is not visualized. No aortic stenosis is  present.   Stress test 10/24/20 (outpatient) Nuclear stress EF: 68%. The left ventricular ejection fraction is hyperdynamic (>65%). There was no ST segment deviation noted during stress. This is a low risk study. There is no evidence of ischemia and no evidence of previous infarction. The study is normal.    Patient Profile     70 y.o. female with history of CAD, hypertension, hyperlipidemia and anxiety seen for chest pain.  Assessment & Plan    1.  Chest pain/dyspnea on exertion -Troponin negative.  EKG without acute ischemic changes.  Recent outpatient stress test was low risk. -Patient was seen by Dr. Allyson Sabal on Friday and felt medical therapy  for appropriate.  However, patient had recurrent chest pain and shortness of breath With Minimal Activity in the Room.  Over the weekend with plan for cardiac catheterization later today. - Echo with normal LVEF  2.  Hypertension -BP relatively stable on current meds   3.  Chronic diastolic dysfunction -Patient endorses eating excess salt in diet. - euvolemic by exam   4. AKI - 1.26 on admit which resulted but again Scr bump to 1.24 again without change in meds.  - follow post cath    5.  Diabetes mellitus - metformin on hold for cath    6. HLD - 08/17/2020: Cholesterol, Total 169; HDL 29; LDL Chol Calc (NIH) 57; Triglycerides 551  -Continue Zocor, Vascepa and Zetia  For questions or updates, please contact [facility] Please consult [email or web address] for contact info under        Signed, Manson Passey, PA  10/30/2020, 9:41 AM     I have personally seen and examined this patient. I agree with the assessment and plan as outlined above.  Pt with known CAD with prior LAD stent. Cardiac cath a year ago with patent LAD stent and widely patent RCA. Pt admitted with chest pain. Troponin negative. No ischemic EKG changes. Echo with normal LV systolic function. Recurrent pain over the weekend. Plans in place for cardiac cath today to exclude progression of CAD. She is on ASA and Plavix. All questions answered.   Verne Carrow, MD 10/30/2020 10:08 AM

## 2020-10-30 NOTE — Progress Notes (Addendum)
Progress Note  Patient Name: [name] Date of Encounter: 10/30/2020  [facility] Cardiologist: Chrystie Nose, MD   Subjective   Feeling well. No chest pain, sob or palpitations.  For cath today.   Inpatient Medications    Scheduled Meds:  amLODipine  10 mg Oral Daily   aspirin EC  81 mg Oral Daily   clopidogrel  75 mg Oral Daily   ezetimibe  10 mg Oral Daily   fenofibrate  160 mg Oral Daily   FLUoxetine  20 mg Oral Daily   heparin  5,000 Units Subcutaneous Q8H   icosapent Ethyl  1 g Oral BID   insulin aspart  0-15 Units Subcutaneous TID WC   insulin aspart  0-5 Units Subcutaneous QHS   irbesartan  150 mg Oral Daily   isosorbide mononitrate  60 mg Oral Daily   metoprolol succinate  12.5 mg Oral Daily   polyethylene glycol  17 g Oral Daily   simvastatin  20 mg Oral Daily   sodium chloride flush  3 mL Intravenous Q12H   Continuous Infusions:  sodium chloride     sodium chloride 75 mL/hr at 10/30/20 0634   PRN Meds: sodium chloride, acetaminophen, cyclobenzaprine, diazepam, ondansetron (ZOFRAN) IV, sodium chloride flush   Vital Signs    Vitals:   10/29/20 2017 10/29/20 2303 10/30/20 0434 10/30/20 0848  BP: (!) 127/52  (!) 142/71 (!) 136/95  Pulse: 62  (!) 55 (!) 55  Resp: 17  16   Temp: 97.8 F (36.6 C)  97.8 F (36.6 C)   TempSrc: Oral  Oral   SpO2: 98% 98% 98%   Weight:      Height:       No intake or output data in the 24 hours ending 10/30/20 0941 Last 3 Weights 10/27/2020 10/24/2020 10/18/2020  Weight (lbs) 205 lb 3.2 oz 205 lb 205 lb 3.2 oz  Weight (kg) 93.078 kg 92.987 kg 93.078 kg      Telemetry    Bradycardia/rhythm- Personally Reviewed  ECG    No new tracing  Physical Exam   GEN: No acute distress.   Neck: No JVD Cardiac: RRR, no murmurs, rubs, or gallops.  Respiratory: Clear to auscultation bilaterally. GI: Soft, nontender, non-distended  MS: No edema; No deformity. Neuro:  Nonfocal  Psych: Normal affect   Labs    High  Sensitivity Troponin:   Recent Labs  Lab 10/26/20 1619 10/26/20 2152 10/26/20 2352 10/28/20 1003 10/28/20 1245  TROPONINIHS 19* 18* 16 8 8       Chemistry Recent Labs  Lab 10/28/20 0244 10/29/20 0226 10/30/20 0042  NA 137 136 134*  K 4.5 4.2 4.0  CL 107 103 101  CO2 24 26 26   GLUCOSE 136* 155* 153*  BUN 17 25* 28*  CREATININE 0.95 0.98 1.24*  CALCIUM 9.3 9.9 9.8  GFRNONAA >60 >60 47*  ANIONGAP 6 7 7      Hematology Recent Labs  Lab 10/26/20 1426  WBC 11.5*  RBC 4.80  HGB 12.3  HCT 38.9  MCV 81.0  MCH 25.6*  MCHC 31.6  RDW 14.6  PLT 396      Radiology    No results found.  Cardiac Studies   Echo 10/27/20  1. Left ventricular ejection fraction, by estimation, is 60 to 65%. The  left ventricle has normal function. The left ventricle has no regional  wall motion abnormalities. There is moderate asymmetric left ventricular  hypertrophy. Left ventricular  diastolic parameters were normal.  2. Right ventricular systolic function is normal. The right ventricular  size is normal.   3. The mitral valve is normal in structure. Trivial mitral valve  regurgitation.   4. The aortic valve is tricuspid. There is mild thickening of the aortic  valve. Aortic valve regurgitation is not visualized. No aortic stenosis is  present.   Stress test 10/24/20 (outpatient) Nuclear stress EF: 68%. The left ventricular ejection fraction is hyperdynamic (>65%). There was no ST segment deviation noted during stress. This is a low risk study. There is no evidence of ischemia and no evidence of previous infarction. The study is normal.    Patient Profile     70 y.o. female with history of CAD, hypertension, hyperlipidemia and anxiety seen for chest pain.  Assessment & Plan    1.  Chest pain/dyspnea on exertion -Troponin negative.  EKG without acute ischemic changes.  Recent outpatient stress test was low risk. -Patient was seen by Dr. Allyson Sabal on Friday and felt medical therapy  for appropriate.  However, patient had recurrent chest pain and shortness of breath With Minimal Activity in the Room.  Over the weekend with plan for cardiac catheterization later today. - Echo with normal LVEF  2.  Hypertension -BP relatively stable on current meds   3.  Chronic diastolic dysfunction -Patient endorses eating excess salt in diet. - euvolemic by exam   4. AKI - 1.26 on admit which resulted but again Scr bump to 1.24 again without change in meds.  - follow post cath    5.  Diabetes mellitus - metformin on hold for cath    6. HLD - 08/17/2020: Cholesterol, Total 169; HDL 29; LDL Chol Calc (NIH) 57; Triglycerides 551  -Continue Zocor, Vascepa and Zetia  For questions or updates, please contact [facility] Please consult [email or web address] for contact info under        Signed, Manson Passey, PA  10/30/2020, 9:41 AM     I have personally seen and examined this patient. I agree with the assessment and plan as outlined above.  Pt with known CAD with prior LAD stent. Cardiac cath a year ago with patent LAD stent and widely patent RCA. Pt admitted with chest pain. Troponin negative. No ischemic EKG changes. Echo with normal LV systolic function. Recurrent pain over the weekend. Plans in place for cardiac cath today to exclude progression of CAD. She is on ASA and Plavix. All questions answered.   Verne Carrow, MD 10/30/2020 10:08 AM

## 2020-10-31 ENCOUNTER — Telehealth: Payer: Self-pay

## 2020-10-31 NOTE — Telephone Encounter (Signed)
Transition Care Management Unsuccessful Follow-up Telephone Call  Date of discharge and from where:  10/30/2020-[place]  Attempts:  1st Attempt  Reason for unsuccessful TCM follow-up call:  No answer/busy

## 2020-10-31 NOTE — Progress Notes (Signed)
[facility] [address] [place], [place] [address] [phone]  Date:  11/02/2020   Name:  [name]   DOB:  05/25/1950   MRN:  [id number]  PCP:  Pearline Cables, MD    Chief Complaint: Hospitalization Follow-up (Severe weakness, shortness of breath)   History of Present Illness:  [name] is a 70 y.o. very pleasant female patient who presents with the following:  Patient seen today for hospital follow-up visit She was recently admitted as below with chest pain, negative heart cath She has known history of CAD with stents placed in 2010 and 2014 Admitted from 8/4-8/8; however, she had actually been seen [place] hospital [place] and also by EMS at home prior to admission.  See details below  1.  Chest pain -Troponin negative.  EKG without acute ischemic changes.  Recent outpatient stress test was low risk . study. Per Dr. Jacques Navy "  It was a low risk stress test with an EF of 68%, no ischemia and possible small area of infarction anteriorly vs breast attenuation on my independent review of images". Suspected her symptoms related to hypertensive episode and diastolic dysfunction.  Patient was seen by Dr. Allyson Sabal on Friday and felt medical therapy for appropriate.Her nitroglycerin drip stopped and  Increased Imdur to 60 mg daily next day. However, patient had recurrent chest pain and shortness of breath With Minimal Activity in the Room. Patient underwent cath Monday showing patent LAD stent and no other dz. Felt non cardiac pain. Recommended to follow up with PCP. LVEDP was normal by cath.  2.  Hypertension - Elevated BP on arrival. Amlodipine added and titrated. BP established on current medications. - Advised to keep log of blood pressure and use low sodium diet. 3. AKI - 1.26 on admit which resulted but again Scr bump to 1.24 again without change in meds. - Get BMP during PCP visit in next week 4.  Diabetes  mellitus - metformin on hold for cath  5. HLD - 08/17/2020: Cholesterol, Total 169; HDL 29; LDL Chol Calc (NIH) 57; Triglycerides 551  -Continue Zocor, Vascepa and Zetia Did the patient have an acute coronary syndrome (MI, NSTEMI, STEMI, etc) this admission?:  No                               Did the patient have a percutaneous coronary intervention (stent / angioplasty)?:  No.    Lab Results  Component Value Date   HGBA1C 7.3 (H) 10/29/2020   A1c shows reasonable control diabetes.  Check BMP today Since she got home [name] notes that she continues to feel an ache in her chest over the left breast -this is not new, has been persistent since she was hospitalized  Today [name] notes that she felt SOB walking from the car into my office- this is the farthest she has walked however since she got home She is getting quite SOB still with minimal exertion  She has noted SOB and CP off and on for about 3 weeks prior to her admission.  [place] when this became acutely worse.  She was seen at a hospital in [place] on 7/21- they evaluated her for CP and put her in Imdur She went home from [facility] but EMS came out to see her the next day  due to CP again.  EKG was ok -apparently EMS communicated with her cardiology team and she was able to stay home  Her appt with Dr Rennis Golden is not until next month -however, we discussed today that her problem does not appear to be cardiac Her main concern today is shortness of breath, it does not appear that pulmonary embolism was ruled out during her admission  Pt notes that her mother had several blood clots over her life and had ?factor 5 leiden  Patient Active Problem List   Diagnosis Date Noted   AKI (acute kidney injury) (HCC) 10/30/2020   Obstructive sleep apnea treated with continuous positive airway pressure (CPAP) 05/07/2018   Chest pain 01/31/2018   Morbid obesity (HCC) 11/03/2017   Coronary artery disease  involving native coronary artery of native heart with unstable angina pectoris (HCC) 11/03/2017   Insomnia 11/03/2017   Inadequate sleep hygiene 11/03/2017   Anxiety 07/21/2017   Dizziness 10/02/2016   Right patella fracture 08/29/2016   Acquired foot deformity, left 07/18/2016   Osteopenia 11/10/2015   HNP (herniated nucleus pulposus), lumbar 10/12/2014   Spinal stenosis at L4-L5 level 10/12/2014   Iron deficiency anemia 07/29/2014   DM (diabetes mellitus) with complications (HCC) 07/29/2014   Environmental and seasonal allergies 04/28/2014   Spinal stenosis, lumbar region, with neurogenic claudication 01/06/2013   Obesity, morbid, BMI 40.0-49.9 (HCC) 10/20/2012   Chest pain with moderate risk of acute coronary syndrome 10/02/2012   CAD S/P percutaneous coronary angioplasty    Hyperlipidemia with target LDL less than 70    Essential hypertension 04/16/2012    Past Medical History:  Diagnosis Date   Anemia    Arthritis    Bronchitis    hx of    CAD (coronary artery disease)    a. s/p DES to LAD in 2010 with patent stent by cath in 2014 b. low-risk NST in 11/2016   Cataracts, bilateral    Diabetes mellitus without complication (HCC)    Family history of adverse reaction to anesthesia    pts mother had difficulty awakening    GERD (gastroesophageal reflux disease)    Hyperlipidemia LDL goal < 70    Hypertension    Lumbar back pain    Numbness    left leg and foot    Plantar fascia rupture    Left Foot   Sleep apnea    uses CPAP     Past Surgical History:  Procedure Laterality Date   ABDOMINAL HYSTERECTOMY     BACK SURGERY     BREAST CYST ASPIRATION  1995   CAROTID STENT  2009   pt denies    CORONARY ANGIOPLASTY WITH STENT PLACEMENT  2010and 10-02-2012   Stent DES, Xience to prox. LAD   DOPPLER ECHOCARDIOGRAPHY  08/01/2009   EF=>55%,LV normal   LEFT HEART CATH AND CORONARY ANGIOGRAPHY N/A 12/10/2019   Procedure: LEFT HEART CATH AND CORONARY ANGIOGRAPHY;  Surgeon:  Lyn Records, MD;  Location: [facility] INVAS[facility];  Service: Cardiovascular;  Laterality: N/A;   LEFT HEART CATH AND CORONARY ANGIOGRAPHY N/A 10/30/2020   Procedure: LEFT HEART CATH AND CORONARY ANGIOGRAPHY;  Surgeon: Runell Gess, MD;  Location: [facility];  Service: Cardiovascular;  Laterality: N/A;   LEFT HEART CATHETERIZATION WITH CORONARY ANGIOGRAM N/A 10/02/2012   Procedure: LEFT HEART CATHETERIZATION WITH CORONARY ANGIOGRAM;  Surgeon: Runell Gess, MD;  Location: [facility] CATH LAB;  Service: Cardiovascular;  Laterality: N/A;   lower arterial duplex  06/20/10  abi's normal,rgt 0.98,lft 1.06;bilateral PVRs normal   LUMBAR LAMINECTOMY/DECOMPRESSION MICRODISCECTOMY Left 01/06/2013   Procedure: MICRO LUMBAR DECOMPRESSION L4-5 AND L5-S1;  Surgeon: Javier DockerJeffrey C Beane, MD;  Location: [facility] ORS;  Service: Orthopedics;  Laterality: Left;   LUMBAR LAMINECTOMY/DECOMPRESSION MICRODISCECTOMY Left 10/12/2014   Procedure: REVISION MICRO LUMBAR/DECOMPRESSION L4-5 LEFT ;  Surgeon: Jene EveryJeffrey Beane, MD;  Location: [facility] ORS;  Service: Orthopedics;  Laterality: Left;   NM MYOCAR PERF WALL MOTION  09/22/2008   lexiscan-EF 83%; glogal LV systolic fx is norm. ,evidence of mild ischemia basal anterior,midanterior and apical lateral region(s).    ORIF PATELLA Right 08/29/2016   Procedure: OPEN REDUCTION INTERNAL (ORIF) FIXATION RIGHT PATELLA;  Surgeon: Samson FredericSwinteck, Brian, MD;  Location: [facility] ORS;  Service: Orthopedics;  Laterality: Right;  Adductor Block   TUBAL LIGATION     TYMPANOPLASTY Bilateral    UVULOPALATOPHARYNGOPLASTY     pt denies     Social History   Tobacco Use   Smoking status: Never   Smokeless tobacco: Never  Vaping Use   Vaping Use: Never used  Substance Use Topics   Alcohol use: Yes    Comment: occasional, 1 a month wine   Drug use: No    Family History  Problem Relation Age of Onset   Coronary artery disease Mother    Rheum arthritis Mother    Dementia Mother    Heart attack Father     Hypertension Father    Hyperlipidemia Father    Other Father        MVA   Hypertension Brother    Cancer Brother    Heart disease Brother    Cancer Paternal Grandmother        stomach   Diabetes Paternal Grandfather    Breast cancer Neg Hx     Allergies  Allergen Reactions   Crestor [Rosuvastatin] Other (See Comments)    myalgia   Niacin And Related Other (See Comments)    Whelps and skin flushed, mouth tingling    Medication list has been reviewed and updated.  Current Outpatient Medications on File Prior to Visit  Medication Sig Dispense Refill   alendronate (FOSAMAX) 70 MG tablet TAKE 1 TABLET BY MOUTH EVERY 7 (SEVEN) DAYS. TAKE WITH A FULL GLASS OF WATER ON AN EMPTY STOMACH. 12 tablet 3   amLODipine (NORVASC) 10 MG tablet Take 1 tablet (10 mg total) by mouth daily. 30 tablet 6   aspirin EC 81 MG EC tablet Take 1 tablet (81 mg total) by mouth daily. 30 tablet 1   Calcium Carbonate 260 MG CHEW Chew 1 tablet by mouth daily.     cetirizine (ZYRTEC) 10 MG chewable tablet Chew 10 mg by mouth daily as needed for allergies.     cholecalciferol (VITAMIN D) 1000 UNITS tablet Take 1,000 Units by mouth daily.     clopidogrel (PLAVIX) 75 MG tablet TAKE 1 TABLET BY MOUTH DAILY. 90 tablet 3   cyclobenzaprine (FLEXERIL) 10 MG tablet TAKE 1 TABLET BY MOUTH TWICE DAILY AS NEEDED FOR MUSCLE SPASMS 30 tablet 2   diazepam (VALIUM) 2 MG tablet TAKE 1 TABLET BY MOUTH EVERY 8 HOURS AS NEEDED FOR MUSCLE SPASMS 30 tablet 0   ezetimibe (ZETIA) 10 MG tablet TAKE 1 TABLET BY MOUTH ONCE A DAY 90 tablet 3   fenofibrate (TRICOR) 145 MG tablet Take 1 tablet (145 mg total) by mouth daily. 90 tablet 3   FLUoxetine (PROZAC) 20 MG capsule TAKE 1 CAPSULE BY MOUTH ONCE DAILY 90 capsule 3  fluticasone (FLONASE) 50 MCG/ACT nasal spray Place 1 spray into both nostrils daily as needed for allergies or rhinitis.     icosapent Ethyl (VASCEPA) 1 g capsule TAKE 2 CAPSULES BY MOUTH TWICE DAILY 120 capsule 11    isosorbide mononitrate (IMDUR) 30 MG 24 hr tablet Take 1 tablet (30 mg total) by mouth daily. 90 tablet 1   metFORMIN (GLUCOPHAGE-XR) 500 MG 24 hr tablet Take 1 tablet (500 mg total) by mouth daily with breakfast. 90 tablet 1   metoprolol succinate (TOPROL-XL) 25 MG 24 hr tablet TAKE 1/2 OF A TABLET BY MOUTH DAILY. 45 tablet 3   Multiple Vitamin (MULTIVITAMIN WITH MINERALS) TABS tablet Take 1 tablet by mouth daily.     nitroGLYCERIN (NITROSTAT) 0.4 MG SL tablet Place 1 tablet (0.4 mg total) under the tongue every 5 (five) minutes as needed for chest pain. 25 tablet 2   simvastatin (ZOCOR) 40 MG tablet TAKE 1 TABLET BY MOUTH DAILY. 90 tablet 1   telmisartan (MICARDIS) 40 MG tablet TAKE 1 TABLET BY MOUTH EVERY MORNING * NEEDS OFFICE VISIT 90 tablet 2   [DISCONTINUED] rosuvastatin (CRESTOR) 20 MG tablet Take 1 tablet (20 mg total) by mouth daily. 30 tablet 3   Current Facility-Administered Medications on File Prior to Visit  Medication Dose Route Frequency Provider Last Rate Last Admin   sodium chloride flush (NS) 0.9 % injection 3 mL  3 mL Intravenous Q12H Ronney Asters, NP        Review of Systems:  As per HPI- otherwise negative.   Physical Examination: Vitals:   11/02/20 1126  BP: 122/72  Pulse: 78  Resp: 18  Temp: (!) 97.1 F (36.2 C)  SpO2: 97%   Vitals:   11/02/20 1126  Weight: 203 lb (92.1 kg)  Height: 5\' 1"  (1.549 m)   Body mass index is 38.36 kg/m. Ideal Body Weight: Weight in (lb) to have BMI = 25: 132  GEN: no acute distress.  Obese, appears her normal self but moving a bit more slowly than normal HEENT: Atraumatic, Normocephalic.  Ears and Nose: No external deformity. CV: RRR, No M/G/R. No JVD. No thrill. No extra heart sounds. PULM: CTA B, no wheezes, crackles, rhonchi. No retractions. No resp. distress. No accessory muscle use. ABD: S, NT, ND, +BS. No rebound. No HSM. EXTR: No c/c/e PSYCH: Normally interactive. Conversant.    Assessment and  Plan: Hospital discharge follow-up  CAD S/P percutaneous coronary angioplasty - Plan: nitroGLYCERIN (NITROSTAT) 0.4 MG SL tablet  DM (diabetes mellitus) with complications (HCC)  Essential hypertension  Hyperlipidemia with target LDL less than 70  Morbid obesity (HCC)  Acute renal insufficiency - Plan: Basic metabolic panel  SOB (shortness of breath) - Plan: D-Dimer, Quantitative, albuterol (VENTOLIN HFA) 108 (90 Base) MCG/ACT inhaler, Ambulatory referral to Pulmonology  Following up today from recent hospital admission for chest pain-she had full evaluation including cardiac cath, no cardiac explanation for pain was discovered. She was noted to have a decrease in her renal function during admission We discussed her symptoms in detail today.  I would like to attempt to rule out pulmonary embolism.  We will start with a D-dimer, she understands if positive we will proceed to a CT angiogram  The patient and her husband expressed that they had read/heard some concerning things about safety of metformin.  I advised them that metformin is still a mainstay in treatment of diabetes, it does require monitoring of renal function but is generally quite safe  This  visit occurred during the SARS-CoV-2 public health emergency.  Safety protocols were in place, including screening questions prior to the visit, additional usage of staff PPE, and extensive cleaning of exam room while observing appropriate contact time as indicated for disinfecting solutions.   Signed Abbe Amsterdam, MD  Received her labs as below, called patient at about 4:30 PM  I am very pleased that her D-dimer is negative.  This does make a pulmonary embolism quite unlikely Creatinine, GFR improving At this point certainly okay to stay on metformin, her A1c has been under reasonable control and she is only taking 500 mg.  However, if they do wish to change to a different medication at some point that is also okay  We will try  albuterol for her shortness of breath Refilled nitroglycerin to have on hand Placed referral to pulmonology to further evaluate shortness of breath  I asked [name] to keep me closely updated at her symptoms and progress, and she agrees to do so Results for orders placed or performed in visit on 11/02/20  Basic metabolic panel  Result Value Ref Range   Sodium 140 135 - 145 mEq/L   Potassium 3.9 3.5 - 5.1 mEq/L   Chloride 104 96 - 112 mEq/L   CO2 30 19 - 32 mEq/L   Glucose, Bld 117 (H) 70 - 99 mg/dL   BUN 19 6 - 23 mg/dL   Creatinine, Ser 6.75 0.40 - 1.20 mg/dL   GFR 91.63 (L) >84.66 mL/min   Calcium 10.1 8.4 - 10.5 mg/dL  D-Dimer, Quantitative  Result Value Ref Range   D-Dimer, Quant 0.28 <0.50 mcg/mL FEU

## 2020-11-01 ENCOUNTER — Telehealth: Payer: Self-pay

## 2020-11-01 NOTE — Telephone Encounter (Signed)
Transition Care Management Follow-up Telephone Call Date of discharge and from where: 10/30/20-[place] How have you been since you were released from the hospital? Doing ok but still having some SOB on exertion. Patient advised to go to the ER if symptoms worsen. She has an appt with PCP tomorrow. Any questions or concerns? No  Items Reviewed: Did the pt receive and understand the discharge instructions provided? Yes  Medications obtained and verified? Yes  Other? Yes  Any new allergies since your discharge? No  Dietary orders reviewed? Yes Do you have support at home? Yes   Home Care and Equipment/Supplies: Were home health services ordered? no If so, what is the name of the agency? N/a  Has the agency set up a time to come to the patient's home? not applicable Were any new equipment or medical supplies ordered?  No What is the name of the medical supply agency? N/a Were you able to get the supplies/equipment? not applicable Do you have any questions related to the use of the equipment or supplies? N/a  Functional Questionnaire: (I = Independent and D = Dependent) ADLs: I  Bathing/Dressing- I  Meal Prep- I  Eating- I  Maintaining continence- I  Transferring/Ambulation- I  Managing Meds- I  Follow up appointments reviewed:  PCP Hospital f/u appt confirmed? Yes  Scheduled to see Dr. Patsy Lager on 11/02/2020 @ 11:20. Specialist Hospital f/u appt confirmed? Yes  Scheduled to see Dr. Rennis Golden on 12/06/2020 @ 8:00. Are transportation arrangements needed? No  If their condition worsens, is the pt aware to call PCP or go to the Emergency Dept.? Yes Was the patient provided with contact information for the PCP's office or ED? Yes Was to pt encouraged to call back with questions or concerns? Yes

## 2020-11-02 ENCOUNTER — Other Ambulatory Visit: Payer: Self-pay

## 2020-11-02 ENCOUNTER — Other Ambulatory Visit (HOSPITAL_COMMUNITY): Payer: Self-pay

## 2020-11-02 ENCOUNTER — Ambulatory Visit (INDEPENDENT_AMBULATORY_CARE_PROVIDER_SITE_OTHER): Payer: PPO | Admitting: Family Medicine

## 2020-11-02 VITALS — BP 122/72 | HR 78 | Temp 97.1°F | Resp 18 | Ht 61.0 in | Wt 203.0 lb

## 2020-11-02 DIAGNOSIS — E785 Hyperlipidemia, unspecified: Secondary | ICD-10-CM | POA: Diagnosis not present

## 2020-11-02 DIAGNOSIS — I10 Essential (primary) hypertension: Secondary | ICD-10-CM

## 2020-11-02 DIAGNOSIS — R0602 Shortness of breath: Secondary | ICD-10-CM | POA: Diagnosis not present

## 2020-11-02 DIAGNOSIS — I2510 Atherosclerotic heart disease of native coronary artery without angina pectoris: Secondary | ICD-10-CM

## 2020-11-02 DIAGNOSIS — E118 Type 2 diabetes mellitus with unspecified complications: Secondary | ICD-10-CM | POA: Diagnosis not present

## 2020-11-02 DIAGNOSIS — Z9861 Coronary angioplasty status: Secondary | ICD-10-CM | POA: Diagnosis not present

## 2020-11-02 DIAGNOSIS — Z09 Encounter for follow-up examination after completed treatment for conditions other than malignant neoplasm: Secondary | ICD-10-CM

## 2020-11-02 DIAGNOSIS — N289 Disorder of kidney and ureter, unspecified: Secondary | ICD-10-CM

## 2020-11-02 LAB — BASIC METABOLIC PANEL
BUN: 19 mg/dL (ref 6–23)
CO2: 30 mEq/L (ref 19–32)
Calcium: 10.1 mg/dL (ref 8.4–10.5)
Chloride: 104 mEq/L (ref 96–112)
Creatinine, Ser: 1.06 mg/dL (ref 0.40–1.20)
GFR: 53.03 mL/min — ABNORMAL LOW (ref >60.00)
Glucose, Bld: 117 mg/dL — ABNORMAL HIGH (ref 70–99)
Potassium: 3.9 mEq/L (ref 3.5–5.1)
Sodium: 140 mEq/L (ref 135–145)

## 2020-11-02 LAB — D-DIMER, QUANTITATIVE: D-Dimer, Quant: 0.28 mcg/mL FEU (ref <0.50)

## 2020-11-02 MED ORDER — ALBUTEROL SULFATE HFA 108 (90 BASE) MCG/ACT IN AERS
2.0000 | INHALATION_SPRAY | Freq: Four times a day (QID) | RESPIRATORY_TRACT | 1 refills | Status: DC | PRN
  Filled 2020-11-02: qty 18, 25d supply, fill #0

## 2020-11-02 MED ORDER — NITROGLYCERIN 0.4 MG SL SUBL
0.4000 mg | SUBLINGUAL_TABLET | SUBLINGUAL | 2 refills | Status: DC | PRN
  Filled 2020-11-02: qty 25, 10d supply, fill #0
  Filled 2021-09-05: qty 25, 10d supply, fill #1

## 2020-11-02 NOTE — Patient Instructions (Addendum)
It was good to see you again today- take care and I will be in touch with your D dimer asap If your D dimer is positive we will do a CT scan of your lungs.  Let me see how your kidney function looks.  If you wish to use a different diabetes med we can do so- let me just see how your kidney function looks first

## 2020-11-06 ENCOUNTER — Other Ambulatory Visit (HOSPITAL_COMMUNITY): Payer: Self-pay

## 2020-11-06 MED FILL — Simvastatin Tab 40 MG: ORAL | 90 days supply | Qty: 90 | Fill #0 | Status: AC

## 2020-11-09 ENCOUNTER — Other Ambulatory Visit: Payer: Self-pay | Admitting: Family Medicine

## 2020-11-09 ENCOUNTER — Other Ambulatory Visit: Payer: Self-pay

## 2020-11-09 ENCOUNTER — Encounter: Payer: Self-pay | Admitting: Internal Medicine

## 2020-11-09 ENCOUNTER — Encounter: Payer: Self-pay | Admitting: Family Medicine

## 2020-11-09 ENCOUNTER — Other Ambulatory Visit (HOSPITAL_COMMUNITY): Payer: Self-pay

## 2020-11-09 ENCOUNTER — Ambulatory Visit: Payer: PPO | Admitting: Internal Medicine

## 2020-11-09 DIAGNOSIS — R058 Other specified cough: Secondary | ICD-10-CM

## 2020-11-09 DIAGNOSIS — R0600 Dyspnea, unspecified: Secondary | ICD-10-CM

## 2020-11-09 DIAGNOSIS — R0609 Other forms of dyspnea: Secondary | ICD-10-CM

## 2020-11-09 MED ORDER — NYSTATIN 100000 UNIT/GM EX POWD
1.0000 "application " | Freq: Three times a day (TID) | CUTANEOUS | 0 refills | Status: DC
  Filled 2020-11-09: qty 30, 10d supply, fill #0

## 2020-11-09 MED ORDER — RABEPRAZOLE SODIUM 20 MG PO TBEC
20.0000 mg | DELAYED_RELEASE_TABLET | Freq: Every day | ORAL | 11 refills | Status: DC
  Filled 2020-11-09: qty 30, 30d supply, fill #0
  Filled 2020-12-04: qty 30, 30d supply, fill #1

## 2020-11-09 NOTE — Patient Instructions (Signed)
Stop the fosamax and calcium carbonate  Aciphex 20 mg Take 30-60 min before first meal of the day and pepcid ac 20 mg after supper  GERD (REFLUX)  is an extremely common cause of respiratory symptoms just like yours , many times with no obvious heartburn at all.    It can be treated with medication, but also with lifestyle changes including elevation of the head of your bed (ideally with 6 -8inch blocks under the headboard of your bed),  Smoking cessation, avoidance of late meals, excessive alcohol, and avoid fatty foods, chocolate, peppermint, colas, red wine, and acidic juices such as orange juice.  NO MINT OR MENTHOL PRODUCTS SO NO COUGH DROPS  USE SUGARLESS CANDY INSTEAD (Jolley ranchers or Stover's or Life Savers) or even ice chips will also do - the key is to swallow to prevent all throat clearing. NO OIL BASED VITAMINS - use powdered substitutes.  Avoid fish oil when coughing.    Please schedule a follow up office visit in 6 weeks, call sooner if needed

## 2020-11-09 NOTE — Progress Notes (Signed)
[name], female    DOB: [birth year]    MRN: [id number]   Brief patient profile:  89 yowf never smoker/ chronic ear infections  referred to pulmonary clinic 11/09/2020 by Dr Patsy Lager for sob but has had a cough ever since Flu Feb 2020 sick x 3-4 weeks  Started fosamax 05/28/19     History of Present Illness  11/09/2020  Pulmonary/ 1st office eval/Nakhi Choi  Chief Complaint  Patient presents with   Consult    SOB for 5 weeks with exertion, cough and wheezing at night, tightness in chest at times  Dyspnea:  5 weeks prior to OV  noted getting back to house from [facility] with cough Cough: after supper after stirs in am / note green nasal drainage one day prior to OV   Sleep: sleep on cpap/ perfectly flat  SABA use: abuterol Cp started oct 2021 just the to the Left of sternum  No obvious day to day or daytime variability or assoc  mucus plugs or hemoptysis  or overt sinus or hb symptoms.    Also denies any obvious fluctuation of symptoms with weather or environmental changes or other aggravating or alleviating factors except as outlined above   No unusual exposure hx or h/o childhood pna/ asthma or knowledge of premature birth.  Current Allergies, Complete Past Medical History, Past Surgical History, Family History, and Social History were reviewed in [employer] record.  ROS  The following are not active complaints unless bolded Hoarseness, sore throat, dysphagia, dental problems, itching, sneezing,  nasal congestion or discharge of excess mucus or purulent secretions, ear ache,   fever, chills, sweats, unintended wt loss or wt gain, classically pleuritic or exertional cp,  orthopnea pnd or arm/hand swelling  or leg swelling, presyncope, palpitations, abdominal pain, anorexia, nausea, vomiting, diarrhea  or change in bowel habits or change in bladder habits, change in stools or change in urine, dysuria, hematuria,  rash, arthralgias, visual complaints, headache, numbness,  weakness or ataxia or problems with walking or coordination,  change in mood or  memory.           Past Medical History:  Diagnosis Date   Anemia    Arthritis    Bronchitis    hx of    CAD (coronary artery disease)    a. s/p DES to LAD in 2010 with patent stent by cath in 2014 b. low-risk NST in 11/2016   Cataracts, bilateral    Diabetes mellitus without complication (HCC)    Family history of adverse reaction to anesthesia    pts mother had difficulty awakening    GERD (gastroesophageal reflux disease)    Hyperlipidemia LDL goal < 70    Hypertension    Lumbar back pain    Numbness    left leg and foot    Plantar fascia rupture    Left Foot   Sleep apnea    uses CPAP     Outpatient Medications Prior to Visit  Medication Sig Dispense Refill   albuterol (VENTOLIN HFA) 108 (90 Base) MCG/ACT inhaler Inhale 2 puffs into the lungs every 6 (six) hours as needed for wheezing or shortness of breath. 18 g 1   alendronate (FOSAMAX) 70 MG tablet TAKE 1 TABLET BY MOUTH EVERY 7 (SEVEN) DAYS. TAKE WITH A FULL GLASS OF WATER ON AN EMPTY STOMACH. 12 tablet 3   amLODipine (NORVASC) 10 MG tablet Take 1 tablet (10 mg total) by mouth daily. 30 tablet 6  aspirin EC 81 MG EC tablet Take 1 tablet (81 mg total) by mouth daily. 30 tablet 1   Calcium Carbonate 260 MG CHEW Chew 1 tablet by mouth daily.     cetirizine (ZYRTEC) 10 MG chewable tablet Chew 10 mg by mouth daily as needed for allergies.     cholecalciferol (VITAMIN D) 1000 UNITS tablet Take 1,000 Units by mouth daily.     clopidogrel (PLAVIX) 75 MG tablet TAKE 1 TABLET BY MOUTH DAILY. 90 tablet 3   cyclobenzaprine (FLEXERIL) 10 MG tablet TAKE 1 TABLET BY MOUTH TWICE DAILY AS NEEDED FOR MUSCLE SPASMS 30 tablet 2   diazepam (VALIUM) 2 MG tablet TAKE 1 TABLET BY MOUTH EVERY 8 HOURS AS NEEDED FOR MUSCLE SPASMS 30 tablet 0   ezetimibe (ZETIA) 10 MG tablet TAKE 1 TABLET BY MOUTH ONCE A DAY 90 tablet 3   fenofibrate (TRICOR) 145 MG tablet Take 1  tablet (145 mg total) by mouth daily. 90 tablet 3   FLUoxetine (PROZAC) 20 MG capsule TAKE 1 CAPSULE BY MOUTH ONCE DAILY 90 capsule 3   fluticasone (FLONASE) 50 MCG/ACT nasal spray Place 1 spray into both nostrils daily as needed for allergies or rhinitis.     icosapent Ethyl (VASCEPA) 1 g capsule TAKE 2 CAPSULES BY MOUTH TWICE DAILY 120 capsule 11   isosorbide mononitrate (IMDUR) 30 MG 24 hr tablet Take 1 tablet (30 mg total) by mouth daily. 90 tablet 1   metFORMIN (GLUCOPHAGE-XR) 500 MG 24 hr tablet Take 1 tablet (500 mg total) by mouth daily with breakfast. 90 tablet 1   metoprolol succinate (TOPROL-XL) 25 MG 24 hr tablet TAKE 1/2 OF A TABLET BY MOUTH DAILY. 45 tablet 3   Multiple Vitamin (MULTIVITAMIN WITH MINERALS) TABS tablet Take 1 tablet by mouth daily.     nitroGLYCERIN (NITROSTAT) 0.4 MG SL tablet Place 1 tablet under the tongue every 5 minutes as needed for chest pain. 25 tablet 2   nystatin (MYCOSTATIN/NYSTOP) powder Apply 1 application topically 3 (three) times daily. 30 g 0   simvastatin (ZOCOR) 40 MG tablet TAKE 1 TABLET BY MOUTH DAILY. 90 tablet 1   telmisartan (MICARDIS) 40 MG tablet TAKE 1 TABLET BY MOUTH EVERY MORNING * NEEDS OFFICE VISIT 90 tablet 2   Facility-Administered Medications Prior to Visit  Medication Dose Route Frequency Provider Last Rate Last Admin   sodium chloride flush (NS) 0.9 % injection 3 mL  3 mL Intravenous Q12H Cleaver, Thomasene Ripple, NP         Objective:     BP 118/62 (BP Location: Left Arm, Cuff Size: Normal)   Pulse 75   Temp 97.8 F (36.6 C) (Oral)   Ht  (1.575 m)   Wt 204 lb 6.4 oz (92.7 kg)   LMP  (LMP Unknown)   SpO2 97% Comment: RA  BMI 37.39 kg/m   SpO2: 97 % (RA)  Amb mod obese wf nad   HEENT : pt wearing mask not removed for exam due to covid -19 concerns.    NECK :  without JVD/Nodes/TM/ nl carotid upstrokes bilaterally   LUNGS: no acc muscle use,  Nl contour chest which is clear to A and P bilaterally without cough on  insp or exp maneuvers   CV:  RRR  no s3 or murmur or increase in P2, and no edema   ABD:  soft and nontender with nl inspiratory excursion in the supine position. No bruits or organomegaly appreciated, bowel sounds nl  MS:  Nl gait/ ext warm without deformities, calf tenderness, cyanosis or clubbing No obvious joint restrictions   SKIN: warm and dry without lesions    NEURO:  alert, approp, nl sensorium with  no motor or cerebellar deficits apparent.   Labs ordered/ reviewed:      Chemistry      Component Value Date/Time   NA 140 11/02/2020 1159   K 3.9 11/02/2020 1159   CL 104 11/02/2020 1159   CO2 30 11/02/2020 1159   BUN 19 11/02/2020 1159   CREATININE 1.06 11/02/2020 1159   CREATININE 0.82 09/29/2014 0910      Component Value Date/Time   CALCIUM 10.1 11/02/2020 1159   ALKPHOS 109 05/17/2020 0940   AST 12 05/17/2020 0940   ALT 14 05/17/2020 0940   BILITOT 0.3 05/17/2020 0940        Lab Results  Component Value Date   WBC 11.5 (H) 10/26/2020   HGB 12.3 10/26/2020   HCT 38.9 10/26/2020   MCV 81.0 10/26/2020   PLT 396 10/26/2020     Lab Results  Component Value Date   DDIMER 0.28 11/02/2020      Lab Results  Component Value Date   TSH 2.35 07/01/2019       I personally reviewed images and agree with radiology impression as follows:  CXR:   10/26/20 No acute abnormality of the lungs in AP portable projection       Assessment   Upper airway cough syndrome Onset Feb 2020  -  Trial off fosfamax /ca carb and on max gerd rx 11/09/2020 >>>   Upper airway cough syndrome (previously labeled PNDS),  is so named because it's frequently impossible to sort out how much is  CR/sinusitis with freq throat clearing (which can be related to primary GERD)   vs  causing  secondary (" extra esophageal")  GERD from wide swings in gastric pressure that occur with throat clearing, often  promoting self use of mint and menthol lozenges that reduce the lower esophageal  sphincter tone and exacerbate the problem further in a cyclical fashion.   These are the same pts (now being labeled as having "irritable larynx syndrome" by some cough centers) who not infrequently have a history of having failed to tolerate ace inhibitors,  dry powder inhalers or biphosphonates or report having atypical/extraesophageal reflux symptoms(eg atypical cp)  that don't respond to standard doses of PPI  and are easily confused as having aecopd or asthma flares by even experienced allergists/ pulmonologists (myself included).   Of the three most common causes of  Sub-acute / recurrent or chronic cough, only one (GERD)  can actually contribute to/ trigger  the other two (asthma and post nasal drip syndrome)  and perpetuate the cylce of cough.  While not intuitively obvious, many patients with chronic low grade reflux do not cough until there is a primary insult that disturbs the protective epithelial barrier and exposes sensitive nerve endings.   This is typically viral but can due to PNDS and  either may apply here.     >>> The point is that once this occurs, it is difficult to eliminate the cycle  using anything but a maximally effective acid suppression regimen at least in the short run, accompanied by an appropriate diet to address non acid GERD and control / eliminate the cough itself with use of non-mint/menthol candies short run and perhaps gabapentin longterm if truly refractory   Advised:  The standardized cough guidelines published in Chest by  [place] in 2006 are still the best available and consist of a multiple step process (up to 12!) , not a single office visit,  and are intended  to address this problem logically,  with an alogrithm dependent on response to empiric treatment at  each progressive step  to determine a specific diagnosis with  minimal addtional testing needed. Therefore if adherence is an issue or can't be accurately verified,  it's very unlikely the standard  evaluation and treatment will be successful here.    Furthermore, response to therapy (other than acute cough suppression, which should only be used short term with avoidance of narcotic containing cough syrups if possible), can be a gradual process for which the patient is not likely to  perceive immediate benefit.  Unlike going to an eye doctor where the best perscription is almost always the first one and is immediately effective, this is almost never the case in the management of chronic cough syndromes. Therefore the patient needs to commit up front to consistently adhere to recommendations  for up to 6 weeks of therapy directed at the likely underlying problem(s) before the response can be reasonably evaluated.    DOE (dyspnea on exertion) Onset early July 2022 in setting of uacs/ atypical cp with neg cards w/u including LHC  10/30/20 - 11/09/2020   Walked on RA x  3  lap(s) =  approx 750 @ fast pace, stopped due to end of study with min sob/ no cp or cough with lowest 02 sats 94%  Symptoms are markedly disproportionate to objective findings and not clear to what extent this is actually a pulmonary  problem but pt does appear to have difficult to sort out respiratory symptoms of unknown origin for which  DDX  = almost all start with A and  include Adherence, Ace Inhibitors, Acid Reflux, Active Sinus Disease, Alpha 1 Antitripsin deficiency, Anxiety masquerading as Airways dz,  ABPA,  Allergy(esp in young), Aspiration (esp in elderly), Adverse effects of meds,  Active smoking or Vaping, A bunch of PE's/clot burden (a few small clots can't cause this syndrome unless there is already severe underlying pulm or vascular dz with poor reserve),  Anemia or thyroid disorder, plus two Bs  = Bronchiectasis and Beta blocker use..and one C= CHF    Adherence is always the initial "prime suspect" and is a multilayered concern that requires a "trust but verify" approach in every patient - starting with knowing how to  use medications, especially inhalers, correctly, keeping up with refills and understanding the fundamental difference between maintenance and prns vs those medications only taken for a very short course and then stopped and not refilled.   ? Acid (or non-acid) GERD > always difficult to exclude as up to 75% of pts in some series report no assoc GI/ Heartburn symptoms and she is on fosfamx> rec this this and rx max (24h)  acid suppression and diet restrictions/ reviewed and instructions given in writing.   ? Allergy/asthma > screen on f/u if not better on gerd rx   ? Adverse drug effect :  Fosamax for now the leading suspect   ? Anemia/ thyroid dz > ruled out by recent labs   ? PE > D dimer nl - while a normal  or high normal value (seen commonly in the elderly or chronically ill)  may miss small peripheral pe, the clot burden with sob is moderately high and the d dimer  has a very high neg pred value  if used in this setting.   ? chf > cards ruled out   >>> f/u 6 weeks   Each maintenance medication was reviewed in detail including emphasizing most importantly the difference between maintenance and prns and under what circumstances the prns are to be triggered using an action plan format where appropriate.  Total time for H and P, chart review, counseling,  directly observing portions of ambulatory 02 saturation study/ and generating customized AVS unique to this new pt  office visit / same day charting = 60 min           Sandrea Hughs, MD 11/09/2020

## 2020-11-09 NOTE — Assessment & Plan Note (Addendum)
Onset Feb 2020  -  Trial off fosfamax /ca carb and on max gerd rx 11/09/2020 >>>   Upper airway cough syndrome (previously labeled PNDS),  is so named because it's frequently impossible to sort out how much is  CR/sinusitis with freq throat clearing (which can be related to primary GERD)   vs  causing  secondary (" extra esophageal")  GERD from wide swings in gastric pressure that occur with throat clearing, often  promoting self use of mint and menthol lozenges that reduce the lower esophageal sphincter tone and exacerbate the problem further in a cyclical fashion.   These are the same pts (now being labeled as having "irritable larynx syndrome" by some cough centers) who not infrequently have a history of having failed to tolerate ace inhibitors,  dry powder inhalers or biphosphonates or report having atypical/extraesophageal reflux symptoms(eg atypical cp)  that don't respond to standard doses of PPI  and are easily confused as having aecopd or asthma flares by even experienced allergists/ pulmonologists (myself included).   Of the three most common causes of  Sub-acute / recurrent or chronic cough, only one (GERD)  can actually contribute to/ trigger  the other two (asthma and post nasal drip syndrome)  and perpetuate the cylce of cough.  While not intuitively obvious, many patients with chronic low grade reflux do not cough until there is a primary insult that disturbs the protective epithelial barrier and exposes sensitive nerve endings.   This is typically viral but can due to PNDS and  either may apply here.     >>> The point is that once this occurs, it is difficult to eliminate the cycle  using anything but a maximally effective acid suppression regimen at least in the short run, accompanied by an appropriate diet to address non acid GERD and control / eliminate the cough itself with use of non-mint/menthol candies short run and perhaps gabapentin longterm if truly refractory   Advised: The  standardized cough guidelines published in Chest by [place] in 2006 are still the best available and consist of a multiple step process (up to 12!) , not a single office visit,  and are intended  to address this problem logically,  with an alogrithm dependent on response to empiric treatment at  each progressive step  to determine a specific diagnosis with  minimal addtional testing needed. Therefore if adherence is an issue or can't be accurately verified,  it's very unlikely the standard evaluation and treatment will be successful here.    Furthermore, response to therapy (other than acute cough suppression, which should only be used short term with avoidance of narcotic containing cough syrups if possible), can be a gradual process for which the patient is not likely to  perceive immediate benefit.  Unlike going to an eye doctor where the best perscription is almost always the first one and is immediately effective, this is almost never the case in the management of chronic cough syndromes. Therefore the patient needs to commit up front to consistently adhere to recommendations  for up to 6 weeks of therapy directed at the likely underlying problem(s) before the response can be reasonably evaluated.

## 2020-11-09 NOTE — Assessment & Plan Note (Addendum)
Onset early July 2022 in setting of uacs/ atypical cp with neg cards w/u including LHC  10/30/20 - 11/09/2020   Walked on RA x  3  lap(s) =  approx 750 @ fast pace, stopped due to end of study with min sob/ no cp or cough with lowest 02 sats 94%  Symptoms are markedly disproportionate to objective findings and not clear to what extent this is actually a pulmonary  problem but pt does appear to have difficult to sort out respiratory symptoms of unknown origin for which  DDX  = almost all start with A and  include Adherence, Ace Inhibitors, Acid Reflux, Active Sinus Disease, Alpha 1 Antitripsin deficiency, Anxiety masquerading as Airways dz,  ABPA,  Allergy(esp in young), Aspiration (esp in elderly), Adverse effects of meds,  Active smoking or Vaping, A bunch of PE's/clot burden (a few small clots can't cause this syndrome unless there is already severe underlying pulm or vascular dz with poor reserve),  Anemia or thyroid disorder, plus two Bs  = Bronchiectasis and Beta blocker use..and one C= CHF    Adherence is always the initial "prime suspect" and is a multilayered concern that requires a "trust but verify" approach in every patient - starting with knowing how to use medications, especially inhalers, correctly, keeping up with refills and understanding the fundamental difference between maintenance and prns vs those medications only taken for a very short course and then stopped and not refilled.   ? Acid (or non-acid) GERD > always difficult to exclude as up to 75% of pts in some series report no assoc GI/ Heartburn symptoms and she is on fosfamx> rec this this and rx max (24h)  acid suppression and diet restrictions/ reviewed and instructions given in writing.   ? Allergy/asthma > screen on f/u if not better on gerd rx   ? Adverse drug effect :  Fosamax for now the leading suspect   ? Anemia/ thyroid dz > ruled out by recent labs   ? PE > D dimer nl - while a normal  or high normal value (seen  commonly in the elderly or chronically ill)  may miss small peripheral pe, the clot burden with sob is moderately high and the d dimer  has a very high neg pred value if used in this setting.   ? chf > cards ruled out    >>> f/u 6 weeks   Each maintenance medication was reviewed in detail including emphasizing most importantly the difference between maintenance and prns and under what circumstances the prns are to be triggered using an action plan format where appropriate.  Total time for H and P, chart review, counseling,  directly observing portions of ambulatory 02 saturation study/ and generating customized AVS unique to this new pt  office visit / same day charting = 55 min

## 2020-11-10 ENCOUNTER — Encounter: Payer: Self-pay | Admitting: Family Medicine

## 2020-11-20 ENCOUNTER — Other Ambulatory Visit (HOSPITAL_COMMUNITY): Payer: Self-pay

## 2020-11-21 ENCOUNTER — Other Ambulatory Visit: Payer: Self-pay | Admitting: Family Medicine

## 2020-11-21 DIAGNOSIS — F411 Generalized anxiety disorder: Secondary | ICD-10-CM

## 2020-11-21 MED ORDER — FLUOXETINE HCL 20 MG PO CAPS
ORAL_CAPSULE | Freq: Every day | ORAL | 3 refills | Status: DC
  Filled 2020-11-21: qty 90, 90d supply, fill #0
  Filled 2021-03-21: qty 90, 90d supply, fill #1
  Filled 2021-06-27: qty 90, 90d supply, fill #2
  Filled 2021-09-20: qty 90, 90d supply, fill #3

## 2020-11-22 ENCOUNTER — Other Ambulatory Visit (HOSPITAL_COMMUNITY): Payer: Self-pay

## 2020-12-04 ENCOUNTER — Other Ambulatory Visit (HOSPITAL_COMMUNITY): Payer: Self-pay

## 2020-12-04 MED FILL — Ezetimibe Tab 10 MG: ORAL | 90 days supply | Qty: 90 | Fill #1 | Status: AC

## 2020-12-04 MED FILL — Icosapent Ethyl Cap 1 GM: ORAL | 30 days supply | Qty: 120 | Fill #1 | Status: AC

## 2020-12-04 MED FILL — Clopidogrel Bisulfate Tab 75 MG (Base Equiv): ORAL | 90 days supply | Qty: 90 | Fill #1 | Status: AC

## 2020-12-05 ENCOUNTER — Other Ambulatory Visit (HOSPITAL_COMMUNITY): Payer: Self-pay

## 2020-12-06 ENCOUNTER — Encounter (HOSPITAL_BASED_OUTPATIENT_CLINIC_OR_DEPARTMENT_OTHER): Payer: Self-pay | Admitting: Internal Medicine

## 2020-12-06 ENCOUNTER — Other Ambulatory Visit: Payer: Self-pay

## 2020-12-06 ENCOUNTER — Ambulatory Visit (HOSPITAL_BASED_OUTPATIENT_CLINIC_OR_DEPARTMENT_OTHER): Payer: PPO | Admitting: Internal Medicine

## 2020-12-06 VITALS — BP 134/60 | HR 62 | Ht 61.6 in | Wt 202.0 lb

## 2020-12-06 DIAGNOSIS — I2583 Coronary atherosclerosis due to lipid rich plaque: Secondary | ICD-10-CM | POA: Diagnosis not present

## 2020-12-06 DIAGNOSIS — T466X5D Adverse effect of antihyperlipidemic and antiarteriosclerotic drugs, subsequent encounter: Secondary | ICD-10-CM

## 2020-12-06 DIAGNOSIS — E782 Mixed hyperlipidemia: Secondary | ICD-10-CM | POA: Diagnosis not present

## 2020-12-06 DIAGNOSIS — K219 Gastro-esophageal reflux disease without esophagitis: Secondary | ICD-10-CM

## 2020-12-06 DIAGNOSIS — G4733 Obstructive sleep apnea (adult) (pediatric): Secondary | ICD-10-CM | POA: Diagnosis not present

## 2020-12-06 DIAGNOSIS — M791 Myalgia, unspecified site: Secondary | ICD-10-CM | POA: Diagnosis not present

## 2020-12-06 DIAGNOSIS — I2510 Atherosclerotic heart disease of native coronary artery without angina pectoris: Secondary | ICD-10-CM | POA: Diagnosis not present

## 2020-12-06 DIAGNOSIS — T466X5A Adverse effect of antihyperlipidemic and antiarteriosclerotic drugs, initial encounter: Secondary | ICD-10-CM

## 2020-12-06 LAB — LIPID PANEL
Chol/HDL Ratio: 4 ratio (ref 0.0–4.4)
Cholesterol, Total: 163 mg/dL (ref 100–199)
HDL: 41 mg/dL (ref >39)
LDL Chol Calc (NIH): 102 mg/dL — ABNORMAL HIGH (ref 0–99)
Triglycerides: 112 mg/dL (ref 0–149)
VLDL Cholesterol Cal: 20 mg/dL (ref 5–40)

## 2020-12-06 NOTE — Patient Instructions (Signed)
Medication Instructions:  No Changes In Medications at this time.  *If you need a refill on your cardiac medications before your next appointment, please call your pharmacy*  Lab Work: FASTING LIPID PANEL TODAY ON THE 3rd FLOOR  If you have labs (blood work) drawn today and your tests are completely normal, you will receive your results only by: MyChart Message (if you have MyChart) OR A paper copy in the mail If you have any lab test that is abnormal or we need to change your treatment, we will call you to review the results.  Follow-Up: At [facility], you and your health needs are our priority.  As part of our continuing mission to provide you with exceptional heart care, we have created designated Provider Care Teams.  These Care Teams include your primary Cardiologist (physician) and Advanced Practice Providers (APPs -  Physician Assistants and Nurse Practitioners) who all work together to provide you with the care you need, when you need it.  Your next appointment:   6 month(s)  The format for your next appointment:   In Person  Provider:   K. Italy Hilty, MD

## 2020-12-06 NOTE — Progress Notes (Signed)
Date:  12/06/2020   ID:  [name], DOB [birth year], MRN [id number]  PCP:  Pearline Cables, MD  Primary Cardiologist:  Rennis Golden  CC:  Follow-up chest pain   History of Present Illness: [name] is a 70 y.o. female with a history of CAD with DES stent to prox. LAD in 2010 with residual disease in LCX 10-20% and RCA 20% lesion.  She also has HTN, DM, dyslipidemia and obesity.  She presented on 10/01/12 to the ED with c/o chest pressure. Discomfort began at work and associated with flushing symptoms secondary to niacin. No DOE, SOB, nausea or diaphoresis. She then developed chest pressure and nausea and went to Urgent care. She thought the flushing was secondary to stopping ASA. She was given ASA, cooled down and then had chest presssure. Sent to [facility] ER and NTG SL did resolve the pressure. She was very concerned about the pressure because it was the same that she had with her Lexiscan in 2010 that lead to her stent.  Coronary angiogram revealed widely patent proximal LAD stent with otherwise normal coronary arteries and normal LV function.  [name] recently underwent back surgery and is doing fairly well. She still is having some back pain but is adamant about not taking medication. She plans to go back to work on Monday next week. Unfortunately she is still grieving from her brother who passed away last week. He had stage IV pancreatic cancer and lived less than one year.  [name] returns today for follow-up. Overall she denies any chest pain or worsening shortness of breath. Recently she's been having some palpitations, however she attributes that to stress. She recently is been placed on CPAP and seems to be tolerating that well. She's had some problems with memory loss but neurologic workup is been unremarkable. She recently had worsening back problems and has been to see Dr. Shelle Iron. He is considering surgery on her back but would need her to come off of her antiplatelets  medications.  At the pleasure seeing [name] back today in follow-up. She underwent successful back surgery and is doing better. She's managed to lose significant amount of weight. She's remained on Toprol which was started preoperatively. Her surgery was without complication. Recently she's had some dizziness with change in position. This could be related to dietary changes however I did note that her diastolic pressure is low today at 56. She denies any chest pain or worsening shortness of breath.  02/12/2016  [name] returns today for follow-up. She is recently been having some more palpitations. She reports being under significant stress as her daughter and granddaughter are now living with them. She gets some fullness in her chest when she has palpitations. These episodes occur 2-3 times a week over the past month or so. She denies any exertional chest pain or worsening shortness of breath. Weight is Morrie Sheldon down 3 pounds since her last office visit. Blood pressure is well-controlled. She recently started Celebrex and I spoke with her primary care provider about this. She needs to be careful about taking it regularly in the setting of aspirin and Plavix use.  03/07/2016  I saw [name] back today for follow-up of her monitor. She reports she had 2 episodes of palpitations and some elevated blood pressure on the day of Thanksgiving. She was dealing with her sister who had recently had a brain bleed and had to take her to the emergency room. She apparently had some seizures.  She was also trying to cook Thanksgiving meals and felt overwhelmed. She did take some Valium with some benefit however one point said her blood pressure was up to 210/100. Since then she's felt much better. We did not to capture any episodes of arrhythmias, extrasystoles or anything concerning on her monitor. I suspect her symptoms are related to stress and anxiety. Blood pressure is fairly well-controlled  today.  10/02/2016  [name] returns today for follow-up. Unfortunate she recently broke her leg. She says she has a problem with some foot drag and she misstepped. She had have surgery and is still wearing a brace in the right leg. She is improving and has better range of motion. Blood pressure is good today 138/62. She reports recently she's been having some dizziness. Sometimes positional with sitting up at other times not positional. She is afraid of falling. She's been very anxious. In fact she's been taking Valium although she says only a few times a week. She does not think that is related to either the Valium or her pain medicine. She is concerned it could be her blood pressure although has not noted any low blood pressure readings.  07/21/2017  [name] was seen today in follow-up.  She is without any cardiac complaints.  In September she was hospitalized with chest pain symptoms.  She ruled out for MI and underwent stress testing which was low risk.  Subsequently she was noted to have significant anxiety, some degree of agoraphobia, and fear of walking by herself.  She was started on fluoxetine and has been referred to a psychologist.  She is also undergone some cognitive behavioral therapy which has been helpful.  02/04/2018  [name] was seen today in follow-up of recent hospitalization for chest pain.  She was seen by my partner Dr. Sherlie Ban who felt that her pain was pleuritic, positional and reproducible on exam and not likely cardiac.  She ruled out for MI.  According to her husband recently she has been caring more things and doing more exertion and he feels that she may have strained her back.  She does describe pain and tenderness which was reproducible along the right mid thoracic area that seems to wrap around the anterior chest.  The symptoms are still persistent but somewhat better.  05/17/2020  [name] is seen today in follow-up.  She has been followed by Edd Fabian, NP more recently.   She has had some intolerance to her medications, namely her Vascepa which she currently takes 1 g twice daily.  She was switched to rosuvastatin but she had significant myalgias with this.  She went back to simvastatin 40 mg.  She has not had her lipids reassessed.  Her target LDL is less than 70 given coronary disease although fortunately recent cardiac catheterization showed no significant obstructive disease.  She denies any chest pain or worsening shortness of breath and has lost a couple pounds recently according to her home scale  12/06/2020  [name] returns today for follow-up.  Overall she reports she is feeling much better.  She saw [name] over the summer and was complaining of some chest pain.  She underwent a Myoview stress test which was negative for ischemia.  Subsequently she presented the ER with ongoing left sternal chest pain.  She underwent cardiac catheterization which demonstrated a patent LAD stent and no other significant coronary disease.  LV function was normal by echo.  Ultimately she had a follow-up with Dr. Sherene Sires.  He thought that her symptoms might  be related to reflux in fact had recommended she stop Fosamax.  She was changed to Aciphex.  Since then her symptoms have resolved.  I suspect he was right in that diagnosis.  She does have follow-up with him.  I had added fenofibrate for her triglycerides which were well over 500.  She is due for repeat lipids and is fasting today.  Wt Readings from Last 3 Encounters:  12/06/20 202 lb (91.6 kg)  11/09/20 204 lb 6.4 oz (92.7 kg)  11/02/20 203 lb (92.1 kg)     Past Medical History:  Diagnosis Date   Anemia    Arthritis    Bronchitis    hx of    CAD (coronary artery disease)    a. s/p DES to LAD in 2010 with patent stent by cath in 2014 b. low-risk NST in 11/2016   Cataracts, bilateral    Diabetes mellitus without complication (HCC)    Family history of adverse reaction to anesthesia    pts mother had difficulty  awakening    GERD (gastroesophageal reflux disease)    Hyperlipidemia LDL goal < 70    Hypertension    Lumbar back pain    Numbness    left leg and foot    Plantar fascia rupture    Left Foot   Sleep apnea    uses CPAP     Current Outpatient Medications  Medication Sig Dispense Refill   albuterol (VENTOLIN HFA) 108 (90 Base) MCG/ACT inhaler Inhale 2 puffs into the lungs every 6 (six) hours as needed for wheezing or shortness of breath. 18 g 1   amLODipine (NORVASC) 10 MG tablet Take 1 tablet (10 mg total) by mouth daily. 30 tablet 6   aspirin EC 81 MG EC tablet Take 1 tablet (81 mg total) by mouth daily. 30 tablet 1   cetirizine (ZYRTEC) 10 MG chewable tablet Chew 10 mg by mouth daily as needed for allergies.     cholecalciferol (VITAMIN D) 1000 UNITS tablet Take 1,000 Units by mouth daily.     clopidogrel (PLAVIX) 75 MG tablet TAKE 1 TABLET BY MOUTH DAILY. 90 tablet 3   cyclobenzaprine (FLEXERIL) 10 MG tablet TAKE 1 TABLET BY MOUTH TWICE DAILY AS NEEDED FOR MUSCLE SPASMS 30 tablet 2   diazepam (VALIUM) 2 MG tablet TAKE 1 TABLET BY MOUTH EVERY 8 HOURS AS NEEDED FOR MUSCLE SPASMS 30 tablet 0   ezetimibe (ZETIA) 10 MG tablet TAKE 1 TABLET BY MOUTH ONCE A DAY 90 tablet 3   fenofibrate (TRICOR) 145 MG tablet Take 1 tablet (145 mg total) by mouth daily. 90 tablet 3   FLUoxetine (PROZAC) 20 MG capsule TAKE 1 CAPSULE BY MOUTH ONCE DAILY 90 capsule 3   fluticasone (FLONASE) 50 MCG/ACT nasal spray Place 1 spray into both nostrils daily as needed for allergies or rhinitis.     icosapent Ethyl (VASCEPA) 1 g capsule TAKE 2 CAPSULES BY MOUTH TWICE DAILY 120 capsule 11   isosorbide mononitrate (IMDUR) 30 MG 24 hr tablet Take 1 tablet (30 mg total) by mouth daily. 90 tablet 1   metFORMIN (GLUCOPHAGE-XR) 500 MG 24 hr tablet Take 1 tablet (500 mg total) by mouth daily with breakfast. 90 tablet 1   metoprolol succinate (TOPROL-XL) 25 MG 24 hr tablet TAKE 1/2 OF A TABLET BY MOUTH DAILY. 45 tablet 3    Multiple Vitamin (MULTIVITAMIN WITH MINERALS) TABS tablet Take 1 tablet by mouth daily.     nitroGLYCERIN (NITROSTAT) 0.4 MG SL tablet Place  1 tablet under the tongue every 5 minutes as needed for chest pain. 25 tablet 2   nystatin (MYCOSTATIN/NYSTOP) powder Apply 1 application topically 3 (three) times daily. 30 g 0   RABEprazole (ACIPHEX) 20 MG tablet Take 1 tablet (20 mg total) by mouth daily. 30 tablet 11   simvastatin (ZOCOR) 40 MG tablet TAKE 1 TABLET BY MOUTH DAILY. 90 tablet 1   telmisartan (MICARDIS) 40 MG tablet TAKE 1 TABLET BY MOUTH EVERY MORNING * NEEDS OFFICE VISIT 90 tablet 2   Current Facility-Administered Medications  Medication Dose Route Frequency Provider Last Rate Last Admin   sodium chloride flush (NS) 0.9 % injection 3 mL  3 mL Intravenous Q12H Cleaver, Thomasene Ripple, NP        Allergies:    Allergies  Allergen Reactions   Crestor [Rosuvastatin] Other (See Comments)    myalgia   Niacin And Related Other (See Comments)    Whelps and skin flushed, mouth tingling    Social History:  The patient  reports that she has never smoked. She has never used smokeless tobacco. She reports current alcohol use. She reports that she does not use drugs.   Family history:   Family History  Problem Relation Age of Onset   Coronary artery disease Mother    Rheum arthritis Mother    Dementia Mother    Heart attack Father    Hypertension Father    Hyperlipidemia Father    Other Father        MVA   Hypertension Brother    Cancer Brother    Heart disease Brother    Cancer Paternal Grandmother        stomach   Diabetes Paternal Grandfather    Breast cancer Neg Hx     ROS: Pertinent items noted in HPI and remainder of comprehensive ROS otherwise negative.   PHYSICAL EXAM: VS:  BP 134/60   Pulse 62   Ht 5' 1.6" (1.565 m)   Wt 202 lb (91.6 kg)   LMP  (LMP Unknown)   SpO2 97%   BMI 37.43 kg/m  Deferred  EKG: Deferred  ASSESSMENT: Musculoskeletal chest pain -cath with  no significant coronary disease and a patent stent in the LAD (10/2020) Normal Lexiscan Myoview and normal echo in 10/2020 Chronic RBBB Coronary artery disease status post DES the proximal LAD in 2010, low risk Myoview (11/2016)-LVEF 78% -no significant obstructive disease by cath in September 2021 Hypertension Dyslipidemia Obesity Type 2 diabetes Palpitations OSA on CPAP Anxiety  PLAN:  1.  Mrs. Mishkin may have had musculoskeletal or perhaps reflux related chest pain.  Her symptoms seem to have resolved with the discontinuation of Fosamax and starting on Aciphex by Dr. Sherene Sires.  She has follow-up with him.  I am very comfortable with her coronary disease.  Her cholesterol is still high and because of her prior history of coronary disease will still recommend more aggressive therapy.  She is due for repeat lipids today as we had started her on fenofibrate about 3 months ago.  She is fasting.  Plan follow-up in about 6 months  Chrystie Nose, MD, [facility], FACP  [place]  [facility]  Medical Director of the [facility] Lipid Disorders &  Cardiovascular Risk Reduction Clinic Diplomate of the American Board of Clinical Lipidology Attending Cardiologist  Direct Dial: [phone]  Fax: [phone]  Website:  [email or web address]

## 2020-12-07 ENCOUNTER — Encounter (HOSPITAL_BASED_OUTPATIENT_CLINIC_OR_DEPARTMENT_OTHER): Payer: Self-pay

## 2020-12-16 NOTE — Patient Instructions (Addendum)
It was good to see you again today! Please see me in about 6 months We are checking on Prolia benefits for you (to replace fosamax)  Flu shot today Get covid booster Take care!

## 2020-12-16 NOTE — Progress Notes (Signed)
[facility] [address], [address] [place], [place] [address] [phone]  Date:  12/20/2020   Name:  [name]   DOB:  01-04-1951   MRN:  [id number]  PCP:  Pearline Cables, MD    Chief Complaint: 6 month follow up (Concerns/ questions: pt says she was taken off of Fosamax so she wonders if she needs another agent./Flu shot today: yes/)   History of Present Illness:  [name] is a 70 y.o. very pleasant female patient who presents with the following:  Patient seen today for periodic follow-up visit History of hypertension, CAD, diabetes, sleep apnea, obesity, osteopenia with elevated fracture risk on fosamax  Most recently seen by myself in August for hospital follow-up-at that time she had recently been admitted with chest pain and underwent a cardiac cath which showed patent stents She is status post PCI 2010 and 2014 At her last visit she was still struggling shortness of breath.  Plan for cardiology follow-up in place.  I placed a pulmonology referral, D-dimer negative She is seeing Dr Sherene Sires- he took her off fosamax in case this was causing her reflux and SOB.  He started her on aciphex which does seem to be helping  She notes that she still does feel more SOB than prior to her hospitalization- she is trying to do more exercise, and does feel like she is gradually improving  She also saw her cardiologist, Dr. Rennis Golden on 9/14.  Recent cardiac cath showed no significant CAD, chest pain thought to be musculoskeletal.  Continue aggressive antilipid treatment as below  She would like to start on another medication for her bone density since stopping Fosamax.  We discussed Prolia, she would like to try this  Flu vaccine-we will give today New COVID-vaccine-recommended Recent A1c showed reasonable control diabetes Lipid panel also done earlier this month Lab Results  Component Value Date   HGBA1C 7.3 (H) 10/29/2020    Aspirin 81 Plavix Amlodipine Metoprolol Telmisartan Imdur Simvastatin Zetia Tricor Vascepa Metformin   Patient Active Problem List   Diagnosis Date Noted   Upper airway cough syndrome 11/09/2020   DOE (dyspnea on exertion) 11/09/2020   AKI (acute kidney injury) (HCC) 10/30/2020   Obstructive sleep apnea treated with continuous positive airway pressure (CPAP) 05/07/2018   Chest pain 01/31/2018   Morbid obesity (HCC) 11/03/2017   Coronary artery disease involving native coronary artery of native heart with unstable angina pectoris (HCC) 11/03/2017   Insomnia 11/03/2017   Inadequate sleep hygiene 11/03/2017   Anxiety 07/21/2017   Dizziness 10/02/2016   Right patella fracture 08/29/2016   Acquired foot deformity, left 07/18/2016   Osteopenia 11/10/2015   HNP (herniated nucleus pulposus), lumbar 10/12/2014   Spinal stenosis at L4-L5 level 10/12/2014   Iron deficiency anemia 07/29/2014   DM (diabetes mellitus) with complications (HCC) 07/29/2014   Environmental and seasonal allergies 04/28/2014   Spinal stenosis, lumbar region, with neurogenic claudication 01/06/2013   Obesity, morbid, BMI 40.0-49.9 (HCC) 10/20/2012   Chest pain with moderate risk of acute coronary syndrome 10/02/2012   CAD S/P percutaneous coronary angioplasty    Hyperlipidemia with target LDL less than 70    Essential hypertension 04/16/2012    Past Medical History:  Diagnosis Date   Anemia    Arthritis    Bronchitis    hx of    CAD (coronary artery disease)    a. s/p DES to LAD in 2010 with  patent stent by cath in 2014 b. low-risk NST in 11/2016   Cataracts, bilateral    Diabetes mellitus without complication (HCC)    Family history of adverse reaction to anesthesia    pts mother had difficulty awakening    GERD (gastroesophageal reflux disease)    Hyperlipidemia LDL goal < 70    Hypertension    Lumbar back pain    Numbness    left leg and foot    Plantar fascia rupture    Left Foot    Sleep apnea    uses CPAP     Past Surgical History:  Procedure Laterality Date   ABDOMINAL HYSTERECTOMY     BACK SURGERY     BREAST CYST ASPIRATION  1995   CAROTID STENT  2009   pt denies    CORONARY ANGIOPLASTY WITH STENT PLACEMENT  2010and 10-02-2012   Stent DES, Xience to prox. LAD   DOPPLER ECHOCARDIOGRAPHY  08/01/2009   EF=>55%,LV normal   LEFT HEART CATH AND CORONARY ANGIOGRAPHY N/A 12/10/2019   Procedure: LEFT HEART CATH AND CORONARY ANGIOGRAPHY;  Surgeon: Lyn Records, MD;  Location: [facility];  Service: Cardiovascular;  Laterality: N/A;   LEFT HEART CATH AND CORONARY ANGIOGRAPHY N/A 10/30/2020   Procedure: LEFT HEART CATH AND CORONARY ANGIOGRAPHY;  Surgeon: Runell Gess, MD;  Location: [facility];  Service: Cardiovascular;  Laterality: N/A;   LEFT HEART CATHETERIZATION WITH CORONARY ANGIOGRAM N/A 10/02/2012   Procedure: LEFT HEART CATHETERIZATION WITH CORONARY ANGIOGRAM;  Surgeon: Runell Gess, MD;  Location: [facility] CATH LAB;  Service: Cardiovascular;  Laterality: N/A;   lower arterial duplex  06/20/10   abi's normal,rgt 0.98,lft 1.06;bilateral PVRs normal   LUMBAR LAMINECTOMY/DECOMPRESSION MICRODISCECTOMY Left 01/06/2013   Procedure: MICRO LUMBAR DECOMPRESSION L4-5 AND L5-S1;  Surgeon: Javier Docker, MD;  Location: [facility];  Service: Orthopedics;  Laterality: Left;   LUMBAR LAMINECTOMY/DECOMPRESSION MICRODISCECTOMY Left 10/12/2014   Procedure: REVISION MICRO LUMBAR/DECOMPRESSION L4-5 LEFT ;  Surgeon: Jene Every, MD;  Location: [facility];  Service: Orthopedics;  Laterality: Left;   NM MYOCAR PERF WALL MOTION  09/22/2008   lexiscan-EF 83%; glogal LV systolic fx is norm. ,evidence of mild ischemia basal anterior,midanterior and apical lateral region(s).    ORIF PATELLA Right 08/29/2016   Procedure: OPEN REDUCTION INTERNAL (ORIF) FIXATION RIGHT PATELLA;  Surgeon: Samson Frederic, MD;  Location: [facility] OR[facility];  Service: Orthopedics;  Laterality: Right;  Adductor Block    TUBAL LIGATION     TYMPANOPLASTY Bilateral    UVULOPALATOPHARYNGOPLASTY     pt denies     Social History   Tobacco Use   Smoking status: Never   Smokeless tobacco: Never  Vaping Use   Vaping Use: Never used  Substance Use Topics   Alcohol use: Yes    Comment: occasional, 1 a month wine   Drug use: No    Family History  Problem Relation Age of Onset   Coronary artery disease Mother    Rheum arthritis Mother    Dementia Mother    Heart attack Father    Hypertension Father    Hyperlipidemia Father    Other Father        MVA   Hypertension Brother    Cancer Brother    Heart disease Brother    Cancer Paternal Grandmother        stomach   Diabetes Paternal Grandfather    Breast cancer Neg Hx     Allergies  Allergen Reactions  Crestor [Rosuvastatin] Other (See Comments)    myalgia   Niacin And Related Other (See Comments)    Whelps and skin flushed, mouth tingling    Medication list has been reviewed and updated.  Current Outpatient Medications on File Prior to Visit  Medication Sig Dispense Refill   albuterol (VENTOLIN HFA) 108 (90 Base) MCG/ACT inhaler Inhale 2 puffs into the lungs every 6 (six) hours as needed for wheezing or shortness of breath. 18 g 1   amLODipine (NORVASC) 10 MG tablet Take 1 tablet (10 mg total) by mouth daily. 30 tablet 6   aspirin EC 81 MG EC tablet Take 1 tablet (81 mg total) by mouth daily. 30 tablet 1   cetirizine (ZYRTEC) 10 MG chewable tablet Chew 10 mg by mouth daily as needed for allergies.     cholecalciferol (VITAMIN D) 1000 UNITS tablet Take 1,000 Units by mouth daily.     clopidogrel (PLAVIX) 75 MG tablet TAKE 1 TABLET BY MOUTH DAILY. 90 tablet 3   cyclobenzaprine (FLEXERIL) 10 MG tablet TAKE 1 TABLET BY MOUTH TWICE DAILY AS NEEDED FOR MUSCLE SPASMS 30 tablet 2   diazepam (VALIUM) 2 MG tablet TAKE 1 TABLET BY MOUTH EVERY 8 HOURS AS NEEDED FOR MUSCLE SPASMS 30 tablet 0   ezetimibe (ZETIA) 10 MG tablet TAKE 1 TABLET BY MOUTH ONCE  A DAY 90 tablet 3   fenofibrate (TRICOR) 145 MG tablet Take 1 tablet (145 mg total) by mouth daily. 90 tablet 3   FLUoxetine (PROZAC) 20 MG capsule TAKE 1 CAPSULE BY MOUTH ONCE DAILY 90 capsule 3   fluticasone (FLONASE) 50 MCG/ACT nasal spray Place 1 spray into both nostrils daily as needed for allergies or rhinitis.     icosapent Ethyl (VASCEPA) 1 g capsule TAKE 2 CAPSULES BY MOUTH TWICE DAILY 120 capsule 11   isosorbide mononitrate (IMDUR) 30 MG 24 hr tablet Take 1 tablet (30 mg total) by mouth daily. 90 tablet 1   metFORMIN (GLUCOPHAGE-XR) 500 MG 24 hr tablet Take 1 tablet (500 mg total) by mouth daily with breakfast. 90 tablet 1   metoprolol succinate (TOPROL-XL) 25 MG 24 hr tablet TAKE 1/2 OF A TABLET BY MOUTH DAILY. 45 tablet 3   Multiple Vitamin (MULTIVITAMIN WITH MINERALS) TABS tablet Take 1 tablet by mouth daily.     nitroGLYCERIN (NITROSTAT) 0.4 MG SL tablet Place 1 tablet under the tongue every 5 minutes as needed for chest pain. 25 tablet 2   nystatin (MYCOSTATIN/NYSTOP) powder Apply 1 application topically 3 (three) times daily. 30 g 0   RABEprazole (ACIPHEX) 20 MG tablet Take 1 tablet (20 mg total) by mouth daily. 30 tablet 11   simvastatin (ZOCOR) 40 MG tablet TAKE 1 TABLET BY MOUTH DAILY. 90 tablet 1   telmisartan (MICARDIS) 40 MG tablet TAKE 1 TABLET BY MOUTH EVERY MORNING * NEEDS OFFICE VISIT 90 tablet 2   [DISCONTINUED] rosuvastatin (CRESTOR) 20 MG tablet Take 1 tablet (20 mg total) by mouth daily. 30 tablet 3   Current Facility-Administered Medications on File Prior to Visit  Medication Dose Route Frequency Provider Last Rate Last Admin   sodium chloride flush (NS) 0.9 % injection 3 mL  3 mL Intravenous Q12H Ronney Asters, NP        Review of Systems:  As per HPI- otherwise negative.   Physical Examination: Vitals:   12/20/20 0819  BP: 118/62  Pulse: (!) 59  Resp: 18  Temp: 97.9 F (36.6 C)  SpO2: 98%   Vitals:  12/20/20 0819  Weight: 204 lb 12.8 oz (92.9  kg)  Height: 5\' 2"  (1.575 m)   Body mass index is 37.46 kg/m. Ideal Body Weight: Weight in (lb) to have BMI = 25: 136.4  GEN: no acute distress.  Obese, looks well normal self HEENT: Atraumatic, Normocephalic.  Ears and Nose: No external deformity. CV: RRR, No M/G/R. No JVD. No thrill. No extra heart sounds. PULM: CTA B, no wheezes, crackles, rhonchi. No retractions. No resp. distress. No accessory muscle use. ABD: S, NT, ND, +BS. No rebound. No HSM. EXTR: No c/c/e PSYCH: Normally interactive. Conversant.    Assessment and Plan: Need for influenza vaccination - Plan: Flu Vaccine QUAD High Dose(Fluad)  Osteopenia, unspecified location - Plan: DG Bone Density  CAD S/P percutaneous coronary angioplasty  DM (diabetes mellitus) with complications (HCC)  Morbid obesity (HCC)  SOB (shortness of breath)  Following up today She is seen by cardiology and pulmonology for shortness of breath.  Recent cardiac cath reassuring.  Shortness of breath thought to have a GERD component by pulmonology, changes made as above.  DC Fosamax  A1c checked last month-we will plan to repeat in about 4 months Shortness of breath is improving, she is working on gradual exercise increase  Referral to start Prolia for osteopenia with increased fracture risk This visit occurred during the SARS-CoV-2 public health emergency.  Safety protocols were in place, including screening questions prior to the visit, additional usage of staff PPE, and extensive cleaning of exam room while observing appropriate contact time as indicated for disinfecting solutions.   Signed , MD

## 2020-12-20 ENCOUNTER — Ambulatory Visit: Payer: PPO | Attending: Family Medicine

## 2020-12-20 ENCOUNTER — Ambulatory Visit (INDEPENDENT_AMBULATORY_CARE_PROVIDER_SITE_OTHER): Payer: PPO | Admitting: Family Medicine

## 2020-12-20 ENCOUNTER — Other Ambulatory Visit: Payer: Self-pay

## 2020-12-20 ENCOUNTER — Other Ambulatory Visit (HOSPITAL_COMMUNITY): Payer: Self-pay

## 2020-12-20 VITALS — BP 118/62 | HR 59 | Temp 97.9°F | Resp 18 | Ht 62.0 in | Wt 204.8 lb

## 2020-12-20 DIAGNOSIS — R0602 Shortness of breath: Secondary | ICD-10-CM | POA: Diagnosis not present

## 2020-12-20 DIAGNOSIS — Z9861 Coronary angioplasty status: Secondary | ICD-10-CM | POA: Diagnosis not present

## 2020-12-20 DIAGNOSIS — Z23 Encounter for immunization: Secondary | ICD-10-CM | POA: Diagnosis not present

## 2020-12-20 DIAGNOSIS — M8580 Other specified disorders of bone density and structure, unspecified site: Secondary | ICD-10-CM | POA: Diagnosis not present

## 2020-12-20 DIAGNOSIS — I2510 Atherosclerotic heart disease of native coronary artery without angina pectoris: Secondary | ICD-10-CM | POA: Diagnosis not present

## 2020-12-20 DIAGNOSIS — E118 Type 2 diabetes mellitus with unspecified complications: Secondary | ICD-10-CM | POA: Diagnosis not present

## 2020-12-20 MED ORDER — RABEPRAZOLE SODIUM 20 MG PO TBEC
20.0000 mg | DELAYED_RELEASE_TABLET | Freq: Every day | ORAL | 3 refills | Status: DC
  Filled 2020-12-20 – 2021-01-13 (×2): qty 90, 90d supply, fill #0

## 2020-12-20 NOTE — Progress Notes (Signed)
   Covid-19 Vaccination Clinic  Name:  ADDALINE PEPLINSKI    MRN: [id number] DOB: 1950-06-04  12/20/2020  Ms. Chaudhary was observed post Covid-19 immunization for 15 minutes without incident. She was provided with Vaccine Information Sheet and instruction to access the V-Safe system.   Ms. Loseke was instructed to call [phone] with any severe reactions post vaccine: Difficulty breathing  Swelling of face and throat  A fast heartbeat  A bad rash all over body  Dizziness and weakness

## 2020-12-21 ENCOUNTER — Ambulatory Visit: Payer: PPO | Admitting: Internal Medicine

## 2020-12-21 ENCOUNTER — Encounter: Payer: Self-pay | Admitting: Internal Medicine

## 2020-12-21 DIAGNOSIS — R0600 Dyspnea, unspecified: Secondary | ICD-10-CM

## 2020-12-21 DIAGNOSIS — R0609 Other forms of dyspnea: Secondary | ICD-10-CM

## 2020-12-21 DIAGNOSIS — R058 Other specified cough: Secondary | ICD-10-CM

## 2020-12-21 LAB — CBC WITH DIFFERENTIAL/PLATELET
Basophils Absolute: 0.1 10*3/uL (ref 0.0–0.1)
Basophils Relative: 0.7 % (ref 0.0–3.0)
Eosinophils Absolute: 0.4 10*3/uL (ref 0.0–0.7)
Eosinophils Relative: 5.2 % — ABNORMAL HIGH (ref 0.0–5.0)
HCT: 36.1 % (ref 36.0–46.0)
Hemoglobin: 11.4 g/dL — ABNORMAL LOW (ref 12.0–15.0)
Lymphocytes Relative: 36.4 % (ref 12.0–46.0)
Lymphs Abs: 3.1 10*3/uL (ref 0.7–4.0)
MCHC: 31.5 g/dL (ref 30.0–36.0)
MCV: 79.7 fl (ref 78.0–100.0)
Monocytes Absolute: 0.8 10*3/uL (ref 0.1–1.0)
Monocytes Relative: 9 % (ref 3.0–12.0)
Neutro Abs: 4.1 10*3/uL (ref 1.4–7.7)
Neutrophils Relative %: 48.7 % (ref 43.0–77.0)
Platelets: 333 10*3/uL (ref 150.0–400.0)
RBC: 4.54 Mil/uL (ref 3.87–5.11)
RDW: 14.7 % (ref 11.5–15.5)
WBC: 8.5 10*3/uL (ref 4.0–10.5)

## 2020-12-21 NOTE — Addendum Note (Signed)
Addended by: Demetrio Lapping E on: 12/21/2020 09:32 AM   Modules accepted: Orders

## 2020-12-21 NOTE — Patient Instructions (Addendum)
Strongly recommend the bed blocks x 6-8 inches   For drainage / throat tickle try take CHLORPHENIRAMINE  4 mg  (Chlortab 4mg   at should be easiest to find in the green box)  take one every 4 hours as needed - available over the counter- may cause drowsiness so start with just a dose or two an hour before bedtime and see how you tolerate it before trying in daytime    Please remember to go to the lab department   for your tests - we will call you with the results when they are available.      Please schedule a follow up visit in 3 months but call sooner if needed

## 2020-12-21 NOTE — Assessment & Plan Note (Signed)
Onset Feb 2020  p viral uri -  Trial off fosfamax /ca carb and on max gerd rx 11/09/2020 >>> improved but not completely resolved 12/21/2020  - . Allergy profile 12/21/2020 >  Eos 0. /  IgE   - 12/21/2020 re-inforced bed blocks and added 1st gen H1 blockers per guidelines    Much improved x for am cough which may be noct pnds or gerd > rec above rx and f/u in 3 m, call sooner if needed          Each maintenance medication was reviewed in detail including emphasizing most importantly the difference between maintenance and prns and under what circumstances the prns are to be triggered using an action plan format where appropriate.  Total time for H and P, chart review, counseling, reviewing   and generating customized AVS unique to this office visit / same day charting = 

## 2020-12-21 NOTE — Assessment & Plan Note (Signed)
Onset early July 2022 in setting of uacs/ atypical cp with neg cards w/u including LHC  10/30/20 - 11/09/2020   Walked on RA x  3  lap(s) =  approx 750 @ fast pace, stopped due to end of study with min sob/ no cp or cough with lowest 02 sats 94%  Improving to her satisfaction with reg walking

## 2020-12-21 NOTE — Progress Notes (Signed)
[name], female    DOB: [birth year]    MRN: [id number]   Brief patient profile:  33 yowf never smoker/ chronic ear infections  referred to pulmonary clinic 11/09/2020 by Dr Patsy Lager for sob but has had a cough ever since Flu-like  Feb 2020 sick x 3-4 weeks  Started fosamax 05/28/19     History of Present Illness  11/09/2020  Pulmonary/ 1st office eval/Philipp Callegari  Chief Complaint  Patient presents with   Consult    SOB for 5 weeks with exertion, cough and wheezing at night, tightness in chest at times  Dyspnea:  5 weeks prior to OV  noted getting back to house from [employer] with cough Cough: after supper after stirs in am / note green nasal drainage one day prior to OV   Sleep: sleep on cpap/ perfectly flat  SABA use: abuterol Cp started oct 2021 just the to the Left of sternum Rec Stop the fosamax and calcium carbonate Aciphex 20 mg Take 30-60 min before first meal of the day and pepcid ac 20 mg after supper GERD diet reviewed  bed blocks rec    12/21/2020  f/u ov/Patrice Moates re: cough/wheeze/ cp  maint on gerd rx   Chief Complaint  Patient presents with   Follow-up    Cough is much improved. She has some PND and still has occ cough with clear sputum. She has not used her albuterol inhaler in the past few wks.    Dyspnea:  better outside walking  Cough: 1st hour some cough then better during the day Sleeping: cpap  SABA use: none  02: none  Covid status:   vax x 5    No obvious day to day or daytime variability or assoc excess/ purulent sputum or mucus plugs or hemoptysis or cp or chest tightness, subjective wheeze or overt sinus or hb symptoms.   Sleeping as above without nocturnal xacerbation  of respiratory  c/o's or need for noct saba. Also denies any obvious fluctuation of symptoms with weather or environmental changes or other aggravating or alleviating factors except as outlined above   No unusual exposure hx or h/o childhood pna/ asthma or knowledge of premature  birth.  Current Allergies, Complete Past Medical History, Past Surgical History, Family History, and Social History were reviewed in [employer] record.  ROS  The following are not active complaints unless bolded Hoarseness, sore throat, dysphagia, dental problems, itching, sneezing,  nasal congestion or discharge of excess mucus or purulent secretions, ear ache,   fever, chills, sweats, unintended wt loss or wt gain, classically pleuritic or exertional cp,  orthopnea pnd or arm/hand swelling  or leg swelling, presyncope, palpitations, abdominal pain, anorexia, nausea, vomiting, diarrhea  or change in bowel habits or change in bladder habits, change in stools or change in urine, dysuria, hematuria,  rash, arthralgias, visual complaints, headache, numbness, weakness or ataxia or problems with walking or coordination,  change in mood or  memory.        Current Meds  Medication Sig   albuterol (VENTOLIN HFA) 108 (90 Base) MCG/ACT inhaler Inhale 2 puffs into the lungs every 6 (six) hours as needed for wheezing or shortness of breath.   amLODipine (NORVASC) 10 MG tablet Take 1 tablet (10 mg total) by mouth daily.   aspirin EC 81 MG EC tablet Take 1 tablet (81 mg total) by mouth daily.   cetirizine (ZYRTEC) 10 MG chewable tablet Chew 10 mg by mouth daily as needed for  allergies.   cholecalciferol (VITAMIN D) 1000 UNITS tablet Take 1,000 Units by mouth daily.   clopidogrel (PLAVIX) 75 MG tablet TAKE 1 TABLET BY MOUTH DAILY.   cyclobenzaprine (FLEXERIL) 10 MG tablet TAKE 1 TABLET BY MOUTH TWICE DAILY AS NEEDED FOR MUSCLE SPASMS   diazepam (VALIUM) 2 MG tablet TAKE 1 TABLET BY MOUTH EVERY 8 HOURS AS NEEDED FOR MUSCLE SPASMS   ezetimibe (ZETIA) 10 MG tablet TAKE 1 TABLET BY MOUTH ONCE A DAY   fenofibrate (TRICOR) 145 MG tablet Take 1 tablet (145 mg total) by mouth daily.   FLUoxetine (PROZAC) 20 MG capsule TAKE 1 CAPSULE BY MOUTH ONCE DAILY   fluticasone (FLONASE) 50 MCG/ACT nasal  spray Place 1 spray into both nostrils daily as needed for allergies or rhinitis.   icosapent Ethyl (VASCEPA) 1 g capsule TAKE 2 CAPSULES BY MOUTH TWICE DAILY   isosorbide mononitrate (IMDUR) 30 MG 24 hr tablet Take 1 tablet (30 mg total) by mouth daily.   metFORMIN (GLUCOPHAGE-XR) 500 MG 24 hr tablet Take 1 tablet (500 mg total) by mouth daily with breakfast.   metoprolol succinate (TOPROL-XL) 25 MG 24 hr tablet TAKE 1/2 OF A TABLET BY MOUTH DAILY.   Multiple Vitamin (MULTIVITAMIN WITH MINERALS) TABS tablet Take 1 tablet by mouth daily.   nitroGLYCERIN (NITROSTAT) 0.4 MG SL tablet Place 1 tablet under the tongue every 5 minutes as needed for chest pain.   nystatin (MYCOSTATIN/NYSTOP) powder Apply 1 application topically 3 (three) times daily.   RABEprazole (ACIPHEX) 20 MG tablet Take 1 tablet (20 mg total) by mouth daily.   RABEprazole (ACIPHEX) 20 MG tablet Take 1 tablet (20 mg total) by mouth daily.   simvastatin (ZOCOR) 40 MG tablet TAKE 1 TABLET BY MOUTH DAILY.   telmisartan (MICARDIS) 40 MG tablet TAKE 1 TABLET BY MOUTH EVERY MORNING * NEEDS OFFICE VISIT   Current Facility-Administered Medications for the 12/21/20 encounter (Office Visit) with Nyoka Cowden, MD  Medication   sodium chloride flush (NS) 0.9 % injection 3 mL            Past Medical History:  Diagnosis Date   Anemia    Arthritis    Bronchitis    hx of    CAD (coronary artery disease)    a. s/p DES to LAD in 2010 with patent stent by cath in 2014 b. low-risk NST in 11/2016   Cataracts, bilateral    Diabetes mellitus without complication (HCC)    Family history of adverse reaction to anesthesia    pts mother had difficulty awakening    GERD (gastroesophageal reflux disease)    Hyperlipidemia LDL goal < 70    Hypertension    Lumbar back pain    Numbness    left leg and foot    Plantar fascia rupture    Left Foot   Sleep apnea    uses CPAP          Objective:     Wt Readings from Last 3 Encounters:   12/21/20 204 lb (92.5 kg)  12/20/20 204 lb 12.8 oz (92.9 kg)  12/06/20 202 lb (91.6 kg)      Vital signs reviewed  12/21/2020  - Note at rest 02 sats  97% on RA   General appearance:    jovial mildly obese wf nad     HEENT : pt wearing mask not removed for exam due to covid -19 concerns.    NECK :  without JVD/Nodes/TM/ nl carotid upstrokes bilaterally  LUNGS: no acc muscle use,  Nl contour chest which is clear to A and P bilaterally with  cough early  on FIVC maneuver    CV:  RRR  no s3 or murmur or increase in P2, and no edema   ABD:  soft and nontender with nl inspiratory excursion in the supine position. No bruits or organomegaly appreciated, bowel sounds nl  MS:  Nl gait/ ext warm without deformities, calf tenderness, cyanosis or clubbing No obvious joint restrictions   SKIN: warm and dry without lesions    NEURO:  alert, approp, nl sensorium with  no motor or cerebellar deficits apparent.      Labs ordered 12/21/2020  :  allergy profile           Assessment

## 2020-12-22 ENCOUNTER — Telehealth (HOSPITAL_BASED_OUTPATIENT_CLINIC_OR_DEPARTMENT_OTHER): Payer: Self-pay

## 2020-12-22 LAB — IGE: IgE (Immunoglobulin E), Serum: 144 kU/L — ABNORMAL HIGH (ref <=114)

## 2020-12-26 ENCOUNTER — Other Ambulatory Visit: Payer: Self-pay | Admitting: *Deleted

## 2020-12-26 DIAGNOSIS — R0609 Other forms of dyspnea: Secondary | ICD-10-CM

## 2020-12-26 DIAGNOSIS — R058 Other specified cough: Secondary | ICD-10-CM

## 2020-12-29 ENCOUNTER — Telehealth (HOSPITAL_BASED_OUTPATIENT_CLINIC_OR_DEPARTMENT_OTHER): Payer: Self-pay

## 2020-12-29 ENCOUNTER — Other Ambulatory Visit (HOSPITAL_BASED_OUTPATIENT_CLINIC_OR_DEPARTMENT_OTHER): Payer: Self-pay

## 2020-12-29 MED ORDER — COVID-19MRNA BIVAL VACC PFIZER 30 MCG/0.3ML IM SUSP
INTRAMUSCULAR | 0 refills | Status: DC
  Filled 2020-12-29: qty 0.3, 1d supply, fill #0

## 2021-01-03 ENCOUNTER — Telehealth: Payer: Self-pay | Admitting: Pharmacist

## 2021-01-03 NOTE — Chronic Care Management (AMB) (Signed)
Chronic Care Management Pharmacy Assistant   Name: [name]  MRN: [id number] DOB: 04/21/1950  Reason for Encounter: Disease State General  Recent office visits:  12/20/20-Jessica C. Copland, MD (PCP) 6 month follow up visit. Flu vaccine given. Bone density ordered. Follow up in 6 months. 11/02/20-Jessica C. Copland, MD (PCP) Hospital discharge follow up. Labs ordered. Ambulatory referral to Pulmonology.  Recent consult visits:  12/21/20-(Pulmonology) Casimiro Needle B. Sherene Sires, MD. Consultation visit. Recommend Chlortab 4 mg from local pharmacy. Follow up in 3 months. 12/06/20-(Cardiology) Chrystie Nose, MD. Follow up for chest pain. Discontinue fosamax and start on Aciphex. Follow up in 6 months.Marland Kitchen 11/09/20-(Pulmonology) Charlaine Dalton. Sherene Sires, MD Consultation visit. Stop the fosamax and calcium carbonate. Start on Aciphex 20 mg Take 30-60 min before first meal of the day and pepcid ac 20 mg after supper. Follow up in 6 weeks. 10/18/20-(Cardiology) Storm Frisk. Dan Humphreys, NP. Cardiac follow up. EKG ordered. Follow up in 6 weeks.  Hospital visits:  Medication Reconciliation was completed by comparing discharge summary, patient's EMR and Pharmacy list, and upon discussion with patient.  Admitted to the hospital on 10/26/20 due to Chest pain. Discharge date was 10/30/20. Discharged from [facility].    New?Medications Started at [facility] Discharge:?? -started None noted  Medication Changes at Hospital Discharge: -Changed None noted  Medications Discontinued at Hospital Discharge: -Stopped None noted  Medications that remain the same after Hospital Discharge:??  -All other medications will remain the same.    Medications: Outpatient Encounter Medications as of 01/03/2021  Medication Sig Note   albuterol (VENTOLIN HFA) 108 (90 Base) MCG/ACT inhaler Inhale 2 puffs into the lungs every 6 (six) hours as needed for wheezing or shortness of breath.    amLODipine (NORVASC) 10 MG tablet Take 1  tablet (10 mg total) by mouth daily.    aspirin EC 81 MG EC tablet Take 1 tablet (81 mg total) by mouth daily. 10/26/2020: Pt took 3 tablets today   cetirizine (ZYRTEC) 10 MG chewable tablet Chew 10 mg by mouth daily as needed for allergies.    cholecalciferol (VITAMIN D) 1000 UNITS tablet Take 1,000 Units by mouth daily.    clopidogrel (PLAVIX) 75 MG tablet TAKE 1 TABLET BY MOUTH DAILY.    COVID-19 mRNA bivalent vaccine, Pfizer, injection Inject into the muscle.    cyclobenzaprine (FLEXERIL) 10 MG tablet TAKE 1 TABLET BY MOUTH TWICE DAILY AS NEEDED FOR MUSCLE SPASMS    diazepam (VALIUM) 2 MG tablet TAKE 1 TABLET BY MOUTH EVERY 8 HOURS AS NEEDED FOR MUSCLE SPASMS    ezetimibe (ZETIA) 10 MG tablet TAKE 1 TABLET BY MOUTH ONCE A DAY    fenofibrate (TRICOR) 145 MG tablet Take 1 tablet (145 mg total) by mouth daily.    FLUoxetine (PROZAC) 20 MG capsule TAKE 1 CAPSULE BY MOUTH ONCE DAILY    fluticasone (FLONASE) 50 MCG/ACT nasal spray Place 1 spray into both nostrils daily as needed for allergies or rhinitis.    icosapent Ethyl (VASCEPA) 1 g capsule TAKE 2 CAPSULES BY MOUTH TWICE DAILY    isosorbide mononitrate (IMDUR) 30 MG 24 hr tablet Take 1 tablet (30 mg total) by mouth daily.    metFORMIN (GLUCOPHAGE-XR) 500 MG 24 hr tablet Take 1 tablet (500 mg total) by mouth daily with breakfast.    metoprolol succinate (TOPROL-XL) 25 MG 24 hr tablet TAKE 1/2 OF A TABLET BY MOUTH DAILY.    Multiple Vitamin (MULTIVITAMIN WITH MINERALS) TABS tablet Take 1 tablet by mouth daily.  nitroGLYCERIN (NITROSTAT) 0.4 MG SL tablet Place 1 tablet under the tongue every 5 minutes as needed for chest pain.    nystatin (MYCOSTATIN/NYSTOP) powder Apply 1 application topically 3 (three) times daily.    RABEprazole (ACIPHEX) 20 MG tablet Take 1 tablet (20 mg total) by mouth daily.    RABEprazole (ACIPHEX) 20 MG tablet Take 1 tablet (20 mg total) by mouth daily.    simvastatin (ZOCOR) 40 MG tablet TAKE 1 TABLET BY MOUTH DAILY.     telmisartan (MICARDIS) 40 MG tablet TAKE 1 TABLET BY MOUTH EVERY MORNING * NEEDS OFFICE VISIT    [DISCONTINUED] rosuvastatin (CRESTOR) 20 MG tablet Take 1 tablet (20 mg total) by mouth daily.    Facility-Administered Encounter Medications as of 01/03/2021  Medication   sodium chloride flush (NS) 0.9 % injection 3 mL   Have you had any problems recently with your health? Patient states she has no problems with her health at this time.  Have you had any problems with your pharmacy? Patient states she has no problems with her pharmacy.  What issues or side effects are you having with your medications? Patient states she has no issues or side effects with any of her medications.  What would you like me to pass along to Tammy Eckard,CPP for them to help you with?  Patient states there is nothing at this time.  What can we do to take care of you better? Patient states there is nothing at this time.  Patient has been rescheduled for November 22nd, 2022 at 9am with CPP  Star Rating Drugs: Telmisartan 40 mg Last filled:12/05/20 90 DS Simvastatin 40 mg Last filled:11/06/20 90 DS  Myriam Carolin Coy, RMA Health Concierge

## 2021-01-04 ENCOUNTER — Telehealth: Payer: PPO

## 2021-01-08 ENCOUNTER — Other Ambulatory Visit: Payer: Self-pay | Admitting: Family Medicine

## 2021-01-08 ENCOUNTER — Telehealth: Payer: Self-pay | Admitting: Pharmacist

## 2021-01-08 ENCOUNTER — Other Ambulatory Visit (HOSPITAL_COMMUNITY): Payer: Self-pay

## 2021-01-08 DIAGNOSIS — I10 Essential (primary) hypertension: Secondary | ICD-10-CM

## 2021-01-08 MED ORDER — METOPROLOL SUCCINATE ER 25 MG PO TB24
ORAL_TABLET | ORAL | 1 refills | Status: DC
  Filled 2021-01-08: qty 45, 90d supply, fill #0
  Filled 2021-05-07: qty 45, 90d supply, fill #1

## 2021-01-08 NOTE — Chronic Care Management (AMB) (Signed)
Chronic Care Management Pharmacy Assistant   Name: [name]  MRN: [id number] DOB: [birth year]  Reason for Encounter: Disease State General   Recent office visits:   12/20/20-Jessica C. Copland, MD (PCP) 6 month follow up visit. Flu vaccine given. Bone density ordered. Follow up in 6 months.   11/02/20-Jessica C. Copland, MD (PCP) Hospital discharge follow up. Labs ordered. Ambulatory referral to Pulmonology.   Recent consult visits:  12/21/20-(Pulmonology) Casimiro Needle B. Sherene Sires, MD. Consultation visit. Recommend Chlortab 4 mg from local pharmacy. Follow up in 3 months.   12/06/20-Cardiology Chrystie Nose, MD. Follow up for chest pain. Discontinue fosamax and start on Aciphex. Follow up in 6 months.Marland Kitchen  11/09/20-(Pulmonology) Charlaine Dalton. Sherene Sires, MD Consultation visit. Stop the fosamax and calcium carbonate. Start on Aciphex 20 mg Take 30-60 min before first meal of the day and pepcid ac 20 mg after supper. Follow up in 6 weeks.  10/18/20-Cardiology Storm Frisk. Dan Humphreys, NP. Cardiac follow up. EKG ordered. Follow up in 6 weeks.   Hospital visits:  Medication Reconciliation was completed by comparing discharge summary, patient's EMR and Pharmacy list.   Admitted to the hospital on 10/26/20 due to Chest pain. Discharge date was 10/30/20. Discharged from [facility]     New?Medications Started at [facility] Discharge:?? -started None    Medication Changes at Hospital Discharge: -Changed None   Medications Discontinued at Hospital Discharge: -Stopped None    Medications that remain the same after Hospital Discharge:??  -All other medications will remain the same.      Medications: Outpatient Encounter Medications as of 01/08/2021  Medication Sig Note   albuterol (VENTOLIN HFA) 108 (90 Base) MCG/ACT inhaler Inhale 2 puffs into the lungs every 6 (six) hours as needed for wheezing or shortness of breath.    amLODipine (NORVASC) 10 MG tablet Take 1 tablet (10 mg total) by mouth  daily.    aspirin EC 81 MG EC tablet Take 1 tablet (81 mg total) by mouth daily. 10/26/2020: Pt took 3 tablets today   cetirizine (ZYRTEC) 10 MG chewable tablet Chew 10 mg by mouth daily as needed for allergies.    cholecalciferol (VITAMIN D) 1000 UNITS tablet Take 1,000 Units by mouth daily.    clopidogrel (PLAVIX) 75 MG tablet TAKE 1 TABLET BY MOUTH DAILY.    COVID-19 mRNA bivalent vaccine, Pfizer, injection Inject into the muscle.    cyclobenzaprine (FLEXERIL) 10 MG tablet TAKE 1 TABLET BY MOUTH TWICE DAILY AS NEEDED FOR MUSCLE SPASMS    diazepam (VALIUM) 2 MG tablet TAKE 1 TABLET BY MOUTH EVERY 8 HOURS AS NEEDED FOR MUSCLE SPASMS    ezetimibe (ZETIA) 10 MG tablet TAKE 1 TABLET BY MOUTH ONCE A DAY    fenofibrate (TRICOR) 145 MG tablet Take 1 tablet (145 mg total) by mouth daily.    FLUoxetine (PROZAC) 20 MG capsule TAKE 1 CAPSULE BY MOUTH ONCE DAILY    fluticasone (FLONASE) 50 MCG/ACT nasal spray Place 1 spray into both nostrils daily as needed for allergies or rhinitis.    icosapent Ethyl (VASCEPA) 1 g capsule TAKE 2 CAPSULES BY MOUTH TWICE DAILY    isosorbide mononitrate (IMDUR) 30 MG 24 hr tablet Take 1 tablet (30 mg total) by mouth daily.    metFORMIN (GLUCOPHAGE-XR) 500 MG 24 hr tablet Take 1 tablet (500 mg total) by mouth daily with breakfast.    metoprolol succinate (TOPROL-XL) 25 MG 24 hr tablet TAKE 1/2 OF A TABLET BY MOUTH DAILY.    Multiple Vitamin (  MULTIVITAMIN WITH MINERALS) TABS tablet Take 1 tablet by mouth daily.    nitroGLYCERIN (NITROSTAT) 0.4 MG SL tablet Place 1 tablet under the tongue every 5 minutes as needed for chest pain.    nystatin (MYCOSTATIN/NYSTOP) powder Apply 1 application topically 3 (three) times daily.    RABEprazole (ACIPHEX) 20 MG tablet Take 1 tablet (20 mg total) by mouth daily.    RABEprazole (ACIPHEX) 20 MG tablet Take 1 tablet (20 mg total) by mouth daily.    simvastatin (ZOCOR) 40 MG tablet TAKE 1 TABLET BY MOUTH DAILY.    telmisartan (MICARDIS) 40 MG  tablet TAKE 1 TABLET BY MOUTH EVERY MORNING * NEEDS OFFICE VISIT    [DISCONTINUED] rosuvastatin (CRESTOR) 20 MG tablet Take 1 tablet (20 mg total) by mouth daily.    Facility-Administered Encounter Medications as of 01/08/2021  Medication   sodium chloride flush (NS) 0.9 % injection 3 mL    Have you had any problems recently with your health?   Have you had any problems with your pharmacy?   What issues or side effects are you having with your medications?   What would you like me to pass along to Tammy Eckard,CPP for them to help you with?    What can we do to take care of you better?   Star Rating Drugs: Telmisartan 40 mg Last filled:12/05/20 90 DS Simvastatin 40 mg Last filled:11/06/20 90 DS  Gasper Sells CMA Clinical Pharmacist Assistant [phone]

## 2021-01-11 ENCOUNTER — Encounter: Payer: Self-pay | Admitting: Family Medicine

## 2021-01-11 ENCOUNTER — Encounter (HOSPITAL_BASED_OUTPATIENT_CLINIC_OR_DEPARTMENT_OTHER): Payer: Self-pay

## 2021-01-12 ENCOUNTER — Other Ambulatory Visit (HOSPITAL_COMMUNITY): Payer: Self-pay

## 2021-01-13 ENCOUNTER — Other Ambulatory Visit (HOSPITAL_COMMUNITY): Payer: Self-pay

## 2021-01-30 ENCOUNTER — Other Ambulatory Visit: Payer: Self-pay

## 2021-01-30 ENCOUNTER — Encounter: Payer: Self-pay | Admitting: Allergy

## 2021-01-30 ENCOUNTER — Other Ambulatory Visit (HOSPITAL_COMMUNITY): Payer: Self-pay

## 2021-01-30 ENCOUNTER — Ambulatory Visit: Payer: PPO | Admitting: Allergy

## 2021-01-30 VITALS — BP 140/78 | HR 52 | Temp 97.7°F | Resp 16 | Ht 61.5 in | Wt 205.0 lb

## 2021-01-30 DIAGNOSIS — J310 Chronic rhinitis: Secondary | ICD-10-CM | POA: Diagnosis not present

## 2021-01-30 DIAGNOSIS — R0602 Shortness of breath: Secondary | ICD-10-CM

## 2021-01-30 DIAGNOSIS — R12 Heartburn: Secondary | ICD-10-CM | POA: Diagnosis not present

## 2021-01-30 MED ORDER — FLUTICASONE PROPIONATE HFA 110 MCG/ACT IN AERO
2.0000 | INHALATION_SPRAY | Freq: Two times a day (BID) | RESPIRATORY_TRACT | 3 refills | Status: DC
  Filled 2021-01-30: qty 12, 30d supply, fill #0

## 2021-01-30 MED ORDER — AEROCHAMBER PLUS MISC
2 refills | Status: DC
  Filled 2021-01-30: qty 1, 1d supply, fill #0
  Filled 2021-01-30: qty 1, 30d supply, fill #0

## 2021-01-30 NOTE — Assessment & Plan Note (Signed)
Mainly has symptoms when working outdoors. Takes OTC antihistamines with good benefit. No prior allergy testing.  Today's skin testing showed: Negative to indoor/outdoor allergens but positive control was negative questioning the validity of the results. Patient has not been on antihistamines for more than 3 days.  o Get bloodwork for environmental allergy panel.

## 2021-01-30 NOTE — Assessment & Plan Note (Signed)
>>  ASSESSMENT AND PLAN FOR SHORTNESS OF BREATH WRITTEN ON 01/30/2021  2:01 PM BY Trudy Fusi, DO  Shortness of breath, chest tightness, coughing and wheezing for the last 6 months.  Worse with exertion.  History of cardiac stents and had cardiology work-up (echo and cath) which was unremarkable.  Saw pulmonology as well.  Normal chest x-ray. Bloodwork showed eos 400, IgE 144 and pulmonology concerned about allergic triggers. Today's skin testing showed: Negative to indoor/outdoor allergens but positive control was negative questioning the validity of the results. Patient has not been on antihistamines for more than 3 days.  Get bloodwork for environmental allergy panel. Today's spirometry showed: restrictive disease with 11% improvement in FEV1 post bronchodilator treatment. Clinically feeling unchanged.  Daily controller medication(s): start Flovent  110mcg 2 puffs twice a day with spacer and rinse mouth afterwards. May use albuterol  rescue inhaler 2 puffs every 4 to 6 hours as needed for shortness of breath, chest tightness, coughing, and wheezing. May use albuterol  rescue inhaler 2 puffs 5 to 15 minutes prior to strenuous physical activities. Monitor frequency of use.  Get spirometry at next visit.

## 2021-01-30 NOTE — Assessment & Plan Note (Addendum)
Shortness of breath, chest tightness, coughing and wheezing for the last 6 months.  Worse with exertion.  History of cardiac stents and had cardiology work-up (echo and cath) which was unremarkable.  Saw pulmonology as well.  Normal chest x-ray. Bloodwork showed eos 400, IgE 144 and pulmonology concerned about allergic triggers.  Today's skin testing showed: Negative to indoor/outdoor allergens but positive control was negative questioning the validity of the results. Patient has not been on antihistamines for more than 3 days.  o Get bloodwork for environmental allergy panel. . Today's spirometry showed: restrictive disease with 11% improvement in FEV1 post bronchodilator treatment. Clinically feeling unchanged.  . Daily controller medication(s): start Flovent 2 puffs twice a day with spacer and rinse mouth afterwards. . May use albuterol rescue inhaler 2 puffs every 4 to 6 hours as needed for shortness of breath, chest tightness, coughing, and wheezing. May use albuterol rescue inhaler 2 puffs 5 to 15 minutes prior to strenuous physical activities. Monitor frequency of use.   Get spirometry at next visit.

## 2021-01-30 NOTE — Assessment & Plan Note (Signed)
   See handout for lifestyle and dietary modifications.  Continue Aciphex daily. Nothing to eat or drink for 30 minutes afterwards

## 2021-01-30 NOTE — Progress Notes (Signed)
New Patient Note  RE: [name] MRN: [id number] DOB: 1950/06/17 Date of Office Visit: 01/30/2021  Consult requested by: Nyoka Cowden, MD Primary care provider: Pearline Cables, MD  Chief Complaint: shortness of breath  History of Present Illness: I had the pleasure of seeing [name] for initial evaluation at the [facility] on 01/30/2021. She is a 70 y.o. female, who is referred here by Dr. Sherene Sires (pulmonology) for the evaluation of upper airway cough syndrome and dyspnea on exertion.  She reports symptoms of chest tightness, shortness of breath, coughing, wheezing for 6 months. Patient was on vacation and was unpacking when she noted some chest discomfort. Went to the ER and was observed for about 12 hours. When she came back home she saw her cardiologist and had an echocardiogram which was unremarkable.  A few weeks ago she had some chest pains after eating and her blood pressure was elevated. She had a heart cath which was normal. She has history of cardiac stents and gets anxious when she gets chest pains.   Patient went to see pulmonology for this and noted some wheezing at times as well. Eos 400 and IgE 144 - concerned for allergic triggers.  Patient has CPAP machine which she wears at night.   Current medications include albuterol prn which help. She reports not using aerochamber with inhalers. She tried the following inhalers: none. Main triggers are exercise. In the last month, frequency of symptoms: daily. Frequency of nocturnal symptoms: 0x/month. Frequency of SABA use: 1-2x/week. Interference with physical activity: yes. Sleep is undisturbed. In the last 12 months, emergency room visits/urgent care visits/doctor office visits or hospitalizations due to respiratory issues: 3 for the chest pains. In the last 12 months, oral steroids courses: no. History of pneumonia: no. She was evaluated by pulmonologist in the past. Smoking exposure: no. Up to date with  flu vaccine: yes. Up to date with pneumonia vaccine: yes. Up to date with COVID-19 vaccine: yes. Prior Covid-19 infection: no. History of reflux: yes and currently taking Aciphex with good benefit.  10/26/2020 CXR: "No acute abnormality of the lungs in AP portable projection."  Component     Latest Ref Rng & Units 12/21/2020          WBC     4.0 - 10.5 K/uL 8.5  RBC     3.87 - 5.11 Mil/uL 4.54  Hemoglobin     12.0 - 15.0 g/dL 40.9 (L)  HCT     81.1 - 46.0 % 36.1  MCV     78.0 - 100.0 fl 79.7  MCH     26.0 - 34.0 pg   MCHC     30.0 - 36.0 g/dL 91.4  RDW     78.2 - 95.6 % 14.7  Platelets     150.0 - 400.0 K/uL 333.0  nRBC     0.0 - 0.2 %   Neutrophils     43.0 - 77.0 % 48.7  NEUT#     1.4 - 7.7 K/uL 4.1  Lymphocytes     12.0 - 46.0 % 36.4  Lymphocyte #     0.7 - 4.0 K/uL 3.1  Monocytes Relative     3.0 - 12.0 % 9.0  Monocyte #     0.1 - 1.0 K/uL 0.8  Eosinophil     0.0 - 5.0 % 5.2 (H)  Eosinophils Absolute     0.0 - 0.7 K/uL 0.4  Basophil  0.0 - 3.0 % 0.7  Basophils Absolute     0.0 - 0.1 K/uL 0.1   Component     Latest Ref Rng & Units 12/21/2020  IgE (Immunoglobulin E), Serum     <OR=114 kU/L 144 (H)    Assessment and Plan: Shamonique is a 70 y.o. female with: Shortness of breath Shortness of breath, chest tightness, coughing and wheezing for the last 6 months.  Worse with exertion.  History of cardiac stents and had cardiology work-up (echo and cath) which was unremarkable.  Saw pulmonology as well.  Normal chest x-ray. Bloodwork showed eos 400, IgE 144 and pulmonology concerned about allergic triggers. Today's skin testing showed: Negative to indoor/outdoor allergens but positive control was negative questioning the validity of the results. Patient has not been on antihistamines for more than 3 days.  Get bloodwork for environmental allergy panel. Today's spirometry showed: restrictive disease with 11% improvement in FEV1 post bronchodilator treatment.  Clinically feeling unchanged.  Daily controller medication(s): start Flovent 2 puffs twice a day with spacer and rinse mouth afterwards. May use albuterol rescue inhaler 2 puffs every 4 to 6 hours as needed for shortness of breath, chest tightness, coughing, and wheezing. May use albuterol rescue inhaler 2 puffs 5 to 15 minutes prior to strenuous physical activities. Monitor frequency of use.  Get spirometry at next visit.  Chronic rhinitis Mainly has symptoms when working outdoors. Takes OTC antihistamines with good benefit. No prior allergy testing. Today's skin testing showed: Negative to indoor/outdoor allergens but positive control was negative questioning the validity of the results. Patient has not been on antihistamines for more than 3 days.  Get bloodwork for environmental allergy panel.  Heartburn See handout for lifestyle and dietary modifications. Continue Aciphex daily. Nothing to eat or drink for 30 minutes afterwards  Return in about 2 months (around 04/01/2021).  Meds ordered this encounter  Medications   fluticasone (FLOVENT HFA) 110 MCG/ACT inhaler    Sig: Inhale 2 puffs into the lungs in the morning and at bedtime. with spacer and rinse mouth afterwards.    Dispense:  12 g    Refill:  3   Spacer/Aero-Holding Chambers (AEROCHAMBER PLUS) inhaler    Sig: Use as instructed    Dispense:  1 each    Refill:  2    Lab Orders         Allergens, Zone 2      Other allergy screening: Rhino conjunctivitis:  Sometimes has symptoms when working outdoors - takes OTC antihistamines with good benefit. No prior allergy testing.  Food allergy: no Medication allergy: yes Hymenoptera allergy: no Urticaria: no Eczema:no History of recurrent infections suggestive of immunodeficency: no  Diagnostics: Spirometry:  Tracings reviewed. Her effort: It was hard to get consistent efforts and there is a question as to whether this reflects a maximal maneuver. FVC: 1.75L FEV1:  1.42L, 72% predicted FEV1/FVC ratio: 81% Interpretation: Spirometry consistent with possible restrictive disease with 11% improvement in FEV1 post bronchodilator treatment. Clinically feeling unchanged.   Please see scanned spirometry results for details.  Skin Testing: Environmental allergy panel and select foods. Negative to indoor/outdoor allergens but positive control was negative as well questioning the validity of the results.  Results discussed with patient/family.  Airborne Adult Perc - 01/30/21 0951     Time Antigen Placed 1000    Allergen Manufacturer Waynette Buttery    Location Back    Number of Test 59    Panel 1 Select    1. Control-Buffer  50% Glycerol Negative    2. Control-Histamine 1 mg/ml Negative    3. Albumin saline Negative    4. Bahia Negative    5. French Southern Territories Negative    6. Johnson Negative    7. Kentucky Blue Negative    8. Meadow Fescue Negative    9. Perennial Rye Negative    10. Sweet Vernal Negative    11. Timothy Negative    12. Cocklebur Negative    13. Burweed Marshelder Negative    14. Ragweed, short Negative    15. Ragweed, Giant Negative    16. Plantain,  English Negative    17. Lamb's Quarters Negative    18. Sheep Sorrell Negative    19. Rough Pigweed Negative    20. Marsh Elder, Rough Negative    21. Mugwort, Common Negative    23. Birch mix Negative    24. Beech American Negative    25. Box, Elder Negative    26. Cedar, red Negative    27. Cottonwood, Guinea-Bissau Negative    28. Elm mix Negative    29. Hickory Negative    30. Maple mix Negative    31. Oak, Guinea-Bissau mix Negative    32. Pecan Pollen Negative    33. Pine mix Negative    34. Sycamore Eastern Negative    35. Walnut, Black Pollen Negative    36. Alternaria alternata Negative    37. Cladosporium Herbarum Negative    38. Aspergillus mix Negative    39. Penicillium mix Negative    40. Bipolaris sorokiniana (Helminthosporium) Negative    42. Mucor plumbeus Negative    43. Fusarium  moniliforme Negative    44. Aureobasidium pullulans (pullulara) Negative    45. Rhizopus oryzae Negative    46. Botrytis cinera Negative    47. Epicoccum nigrum Negative    48. Phoma betae Negative    49. Candida Albicans Negative    50. Trichophyton mentagrophytes Negative    51. Mite, D Farinae  5,000 AU/ml Negative    52. Mite, D Pteronyssinus  5,000 AU/ml Negative    53. Cat Hair 10,000 BAU/ml Negative    54.  Dog Epithelia Negative    55. Mixed Feathers Negative    56. Horse Epithelia Negative    57. Cockroach, German Negative    58. Mouse Negative    59. Tobacco Leaf Negative             Food Perc - 01/30/21 1100       Test Information   Time Antigen Placed 4656    Allergen Manufacturer Waynette Buttery    Location Back    Number of allergen test 10    Food Select      Food   1. Peanut Negative    2. Soybean food Negative    3. Wheat, whole Negative    4. Sesame Negative    5. Milk, cow Negative    6. Egg White, chicken Negative    7. Casein Negative    8. Shellfish mix Negative    9. Fish mix Negative    10. Cashew Negative             Past Medical History: Patient Active Problem List   Diagnosis Date Noted   Shortness of breath 01/30/2021   Heartburn 01/30/2021   Chronic rhinitis 01/30/2021   Upper airway cough syndrome 11/09/2020   DOE (dyspnea on exertion) 11/09/2020   AKI (acute kidney injury) (HCC) 10/30/2020   Obstructive sleep apnea  treated with continuous positive airway pressure (CPAP) 05/07/2018   Chest pain 01/31/2018   Morbid obesity (HCC) 11/03/2017   Coronary artery disease involving native coronary artery of native heart with unstable angina pectoris (HCC) 11/03/2017   Insomnia 11/03/2017   Inadequate sleep hygiene 11/03/2017   Anxiety 07/21/2017   Dizziness 10/02/2016   Right patella fracture 08/29/2016   Acquired foot deformity, left 07/18/2016   Osteopenia 11/10/2015   HNP (herniated nucleus pulposus), lumbar 10/12/2014   Spinal  stenosis at L4-L5 level 10/12/2014   Iron deficiency anemia 07/29/2014   DM (diabetes mellitus) with complications (HCC) 07/29/2014   Environmental and seasonal allergies 04/28/2014   Spinal stenosis, lumbar region, with neurogenic claudication 01/06/2013   Obesity, morbid, BMI 40.0-49.9 (HCC) 10/20/2012   Chest pain with moderate risk of acute coronary syndrome 10/02/2012   CAD S/P percutaneous coronary angioplasty    Hyperlipidemia with target LDL less than 70    Essential hypertension 04/16/2012   Past Medical History:  Diagnosis Date   Anemia    Arthritis    Bronchitis    hx of    CAD (coronary artery disease)    a. s/p DES to LAD in 2010 with patent stent by cath in 2014 b. low-risk NST in 11/2016   Cataracts, bilateral    Diabetes mellitus without complication (HCC)    Family history of adverse reaction to anesthesia    pts mother had difficulty awakening    GERD (gastroesophageal reflux disease)    Hyperlipidemia LDL goal < 70    Hypertension    Lumbar back pain    Numbness    left leg and foot    Plantar fascia rupture    Left Foot   Sleep apnea    uses CPAP    Past Surgical History: Past Surgical History:  Procedure Laterality Date   ABDOMINAL HYSTERECTOMY     BACK SURGERY     BREAST CYST ASPIRATION  1995   CAROTID STENT  2009   pt denies    CORONARY ANGIOPLASTY WITH STENT PLACEMENT  2010and 10-02-2012   Stent DES, Xience to prox. LAD   DOPPLER ECHOCARDIOGRAPHY  08/01/2009   EF=>55%,LV normal   LEFT HEART CATH AND CORONARY ANGIOGRAPHY N/A 12/10/2019   Procedure: LEFT HEART CATH AND CORONARY ANGIOGRAPHY;  Surgeon: Lyn Records, MD;  Location: [facility]IVE [facility];  Service: Cardiovascular;  Laterality: N/A;   LEFT HEART CATH AND CORONARY ANGIOGRAPHY N/A 10/30/2020   Procedure: LEFT HEART CATH AND CORONARY ANGIOGRAPHY;  Surgeon: Runell Gess, MD;  Location: [facility];  Service: Cardiovascular;  Laterality: N/A;   LEFT HEART CATHETERIZATION WITH  CORONARY ANGIOGRAM N/A 10/02/2012   Procedure: LEFT HEART CATHETERIZATION WITH CORONARY ANGIOGRAM;  Surgeon: Runell Gess, MD;  Location: [facility] CATH LAB;  Service: Cardiovascular;  Laterality: N/A;   lower arterial duplex  06/20/10   abi's normal,rgt 0.98,lft 1.06;bilateral PVRs normal   LUMBAR LAMINECTOMY/DECOMPRESSION MICRODISCECTOMY Left 01/06/2013   Procedure: MICRO LUMBAR DECOMPRESSION L4-5 AND L5-S1;  Surgeon: Javier Docker, MD;  Location: [facility];  Service: Orthopedics;  Laterality: Left;   LUMBAR LAMINECTOMY/DECOMPRESSION MICRODISCECTOMY Left 10/12/2014   Procedure: REVISION MICRO LUMBAR/DECOMPRESSION L4-5 LEFT ;  Surgeon: Jene Every, MD;  Location: [facility];  Service: Orthopedics;  Laterality: Left;   NM MYOCAR PERF WALL MOTION  09/22/2008   lexiscan-EF 83%; glogal LV systolic fx is norm. ,evidence of mild ischemia basal anterior,midanterior and apical lateral region(s).    ORIF PATELLA Right 08/29/2016  Procedure: OPEN REDUCTION INTERNAL (ORIF) FIXATION RIGHT PATELLA;  Surgeon: Samson Frederic, MD;  Location: [facility];  Service: Orthopedics;  Laterality: Right;  Adductor Block   TUBAL LIGATION     TYMPANOPLASTY Bilateral    UVULOPALATOPHARYNGOPLASTY     pt denies    Medication List:  Current Outpatient Medications  Medication Sig Dispense Refill   aspirin EC 81 MG EC tablet Take 1 tablet (81 mg total) by mouth daily. 30 tablet 1   cetirizine (ZYRTEC) 10 MG chewable tablet Chew 10 mg by mouth daily as needed for allergies.     clopidogrel (PLAVIX) 75 MG tablet TAKE 1 TABLET BY MOUTH DAILY. 90 tablet 3   ezetimibe (ZETIA) 10 MG tablet TAKE 1 TABLET BY MOUTH ONCE A DAY 90 tablet 3   fenofibrate (TRICOR) 145 MG tablet Take 1 tablet (145 mg total) by mouth daily. 90 tablet 3   fluticasone (FLOVENT HFA) 110 MCG/ACT inhaler Inhale 2 puffs into the lungs in the morning and at bedtime. with spacer and rinse mouth afterwards. 12 g 3   icosapent Ethyl (VASCEPA) 1 g capsule TAKE 2 CAPSULES BY  MOUTH TWICE DAILY 120 capsule 11   isosorbide mononitrate (IMDUR) 30 MG 24 hr tablet Take 1 tablet (30 mg total) by mouth daily. 90 tablet 1   metoprolol succinate (TOPROL-XL) 25 MG 24 hr tablet TAKE 1/2 OF A TABLET BY MOUTH DAILY. 45 tablet 1   Multiple Vitamin (MULTIVITAMIN WITH MINERALS) TABS tablet Take 1 tablet by mouth daily.     nitroGLYCERIN (NITROSTAT) 0.4 MG SL tablet Place 1 tablet under the tongue every 5 minutes as needed for chest pain. 25 tablet 2   simvastatin (ZOCOR) 40 MG tablet TAKE 1 TABLET BY MOUTH DAILY. 90 tablet 1   Spacer/Aero-Holding Chambers (AEROCHAMBER PLUS) inhaler Use as instructed 1 each 2   telmisartan (MICARDIS) 40 MG tablet TAKE 1 TABLET BY MOUTH EVERY MORNING * NEEDS OFFICE VISIT 90 tablet 2   albuterol (VENTOLIN HFA) 108 (90 Base) MCG/ACT inhaler Inhale 2 puffs into the lungs every 6 (six) hours as needed for wheezing or shortness of breath. (Patient not taking: Reported on 01/30/2021) 18 g 1   amLODipine (NORVASC) 10 MG tablet Take 1 tablet (10 mg total) by mouth daily. (Patient not taking: Reported on 01/30/2021) 30 tablet 6   cholecalciferol (VITAMIN D) 1000 UNITS tablet Take 1,000 Units by mouth daily. (Patient not taking: Reported on 01/30/2021)     cyclobenzaprine (FLEXERIL) 10 MG tablet TAKE 1 TABLET BY MOUTH TWICE DAILY AS NEEDED FOR MUSCLE SPASMS (Patient not taking: Reported on 01/30/2021) 30 tablet 2   diazepam (VALIUM) 2 MG tablet TAKE 1 TABLET BY MOUTH EVERY 8 HOURS AS NEEDED FOR MUSCLE SPASMS (Patient not taking: Reported on 01/30/2021) 30 tablet 0   FLUoxetine (PROZAC) 20 MG capsule TAKE 1 CAPSULE BY MOUTH ONCE DAILY 90 capsule 3   fluticasone (FLONASE) 50 MCG/ACT nasal spray Place 1 spray into both nostrils daily as needed for allergies or rhinitis. (Patient not taking: Reported on 01/30/2021)     metFORMIN (GLUCOPHAGE-XR) 500 MG 24 hr tablet Take 1 tablet (500 mg total) by mouth daily with breakfast. (Patient not taking: Reported on 01/30/2021) 90 tablet  1   nystatin (MYCOSTATIN/NYSTOP) powder Apply 1 application topically 3 (three) times daily. (Patient not taking: Reported on 01/30/2021) 30 g 0   Current Facility-Administered Medications  Medication Dose Route Frequency Provider Last Rate Last Admin   sodium chloride flush (NS) 0.9 % injection 3  mL  3 mL Intravenous Q12H Ronney Asters, NP       Allergies: Allergies  Allergen Reactions   Crestor [Rosuvastatin] Other (See Comments)    myalgia   Niacin And Related Other (See Comments)    Whelps and skin flushed, mouth tingling   Social History: Social History   Socioeconomic History   Marital status: Married    Spouse name: Interior and spatial designer   Number of children: 2   Years of education: MSN   Highest education level: Not on file  Occupational History   Occupation: DIRECTOR    Employer: [employer]  Tobacco Use   Smoking status: Never   Smokeless tobacco: Never  Vaping Use   Vaping Use: Never used  Substance and Sexual Activity   Alcohol use: Yes    Comment: occasional, 1 a month wine   Drug use: No   Sexual activity: Yes  Other Topics Concern   Not on file  Social History Narrative      Married to Rollins. Education: [employer]. Exercise: Yes   Patient lives at home with her spouse. retired   Caffeine use: 1 drink of tea a day   Social Determinants of Corporate investment banker Strain: Low Risk    Difficulty of Paying Living Expenses: Not very hard  Food Insecurity: Not on file  Transportation Needs: Not on file  Physical Activity: Insufficiently Active   Days of Exercise per Week: 3 days   Minutes of Exercise per Session: 30 min  Stress: Not on file  Social Connections: Not on file   Lives in a 71 year old house. 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, minerals in the house: no Engineer, civil (consulting) in the family room: no Carpet in the bedroom: no Heating: electric Cooling: central Pet: yes 1 dog x 10+ yrs  Family History: Family History   Problem Relation Age of Onset   Coronary artery disease Mother    Rheum arthritis Mother    Dementia Mother    Heart attack Father    Hypertension Father    Hyperlipidemia Father    Other Father        MVA   Hypertension Brother    Cancer Brother    Heart disease Brother    Cancer Paternal Grandmother        stomach   Diabetes Paternal Grandfather    Breast cancer Neg Hx    Problem                               Relation Asthma                                   no Eczema                                no Food allergy                          Daughter  Allergic rhino conjunctivitis     Daughter   Review of Systems  Constitutional:  Negative for appetite change, chills, fever and unexpected weight change.  HENT:  Negative for congestion and rhinorrhea.   Eyes:  Negative for itching.  Respiratory:  Positive for cough, chest tightness, shortness of breath and wheezing.  Cardiovascular:  Negative for chest pain.  Gastrointestinal:  Negative for abdominal pain.  Genitourinary:  Negative for difficulty urinating.  Skin:  Negative for rash.  Neurological:  Negative for headaches.   Objective: BP 140/78   Pulse (!) 52   Temp 97.7 F (36.5 C) (Temporal)   Resp 16   Ht 5' 1.5" (1.562 m)   Wt 205 lb (93 kg)   LMP  (LMP Unknown)   SpO2 97%   BMI 38.11 kg/m  Body mass index is 38.11 kg/m. Physical Exam Vitals and nursing note reviewed.  Constitutional:      Appearance: Normal appearance. She is well-developed.  HENT:     Head: Normocephalic and atraumatic.     Right Ear: External ear normal.     Left Ear: External ear normal.     Ears:     Comments: Scarring on TM b/l.    Nose: Nose normal.     Mouth/Throat:     Mouth: Mucous membranes are moist.     Pharynx: Oropharynx is clear.  Eyes:     Conjunctiva/sclera: Conjunctivae normal.  Cardiovascular:     Rate and Rhythm: Normal rate and regular rhythm.     Heart sounds: Normal heart sounds. No murmur heard.   No  friction rub. No gallop.  Pulmonary:     Effort: Pulmonary effort is normal.     Breath sounds: Normal breath sounds. No wheezing, rhonchi or rales.  Musculoskeletal:     Cervical back: Neck supple.  Skin:    General: Skin is warm.     Findings: No rash.  Neurological:     Mental Status: She is alert and oriented to person, place, and time.  Psychiatric:        Behavior: Behavior normal.   The plan was reviewed with the patient/family, and all questions/concerned were addressed.  It was my pleasure to see [name] today and participate in her care. Please feel free to contact me with any questions or concerns.  Sincerely,  Wyline Mood, DO Allergy & Immunology  [facility] office: [phone] [facility] office: [phone]

## 2021-01-30 NOTE — Patient Instructions (Addendum)
Today's skin testing showed: Negative to indoor/outdoor allergens but positive control did not show up questioning the validity of the results.   Get bloodwork We are ordering labs, so please allow 1-2 weeks for the results to come back. With the newly implemented Cures Act, the labs might be visible to you at the same time that they become visible to me. However, I will not address the results until all of the results are back, so please be patient.  In the meantime, continue recommendations in your patient instructions, including avoidance measures (if applicable), until you hear from me.  Breathing:  Your breathing test did show some improvement after the treatment.  Daily controller medication(s): start Flovent 2 puffs twice a day with spacer and rinse mouth afterwards. May use albuterol rescue inhaler 2 puffs every 4 to 6 hours as needed for shortness of breath, chest tightness, coughing, and wheezing. May use albuterol rescue inhaler 2 puffs 5 to 15 minutes prior to strenuous physical activities. Monitor frequency of use.  Breathing control goals:  Full participation in all desired activities (may need albuterol before activity) Albuterol use two times or less a week on average (not counting use with activity) Cough interfering with sleep two times or less a month Oral steroids no more than once a year No hospitalizations   Heartburn: See handout for lifestyle and dietary modifications. Continue Aciphex daily. Nothing to eat or drink for 30 minutes afterwards  Follow up in 2 months or sooner if needed.

## 2021-01-31 ENCOUNTER — Other Ambulatory Visit (HOSPITAL_COMMUNITY): Payer: Self-pay

## 2021-02-01 ENCOUNTER — Other Ambulatory Visit (HOSPITAL_COMMUNITY): Payer: Self-pay

## 2021-02-05 LAB — ALLERGENS, ZONE 2

## 2021-02-06 ENCOUNTER — Ambulatory Visit: Payer: PPO

## 2021-02-06 NOTE — Progress Notes (Deleted)
Subjective:   [name] is a 70 y.o. female who presents for an Initial Medicare Annual Wellness Visit.  I connected with [name] today by telephone and verified that I am speaking with the correct person using two identifiers. Location patient: home Location provider: work Persons participating in the virtual visit: patient, Engineer, civil (consulting).    I discussed the limitations, risks, security and privacy concerns of performing an evaluation and management service by telephone and the availability of in person appointments. I also discussed with the patient that there may be a patient responsible charge related to this service. The patient expressed understanding and verbally consented to this telephonic visit.    Interactive audio and video telecommunications were attempted between this provider and patient, however failed, due to patient having technical difficulties OR patient did not have access to video capability.  We continued and completed visit with audio only.  Some vital signs may be absent or patient reported.   Time Spent with patient on telephone encounter: *** minutes   Review of Systems    ***       Objective:    There were no vitals filed for this visit. There is no height or weight on file to calculate BMI.  Advanced Directives 10/27/2020 12/10/2019 01/31/2018 01/31/2018 08/29/2016 08/29/2016 08/27/2016  Does Patient Have a Medical Advance Directive? No No Yes Yes Yes Yes No  Type of Advance Directive - - Living will;Healthcare Power of Buyer, retail -  Does patient want to make changes to medical advance directive? - - Yes (ED - Information included in AVS) Yes (ED - Information included in AVS) No - Patient declined - -  Copy of Healthcare Power of Attorney in Chart? - - No - copy requested - No - copy requested - -  Would patient like information on creating a medical advance directive? No - Patient declined No - Patient declined  Yes (ED - Information included in AVS) Yes (ED - Information included in AVS) No - Patient declined - Yes (MAU/Ambulatory/Procedural Areas - Information given)  Pre-existing out of facility DNR order (yellow form or pink MOST form) - - - - - - -    Current Medications (verified) Outpatient Encounter Medications as of 02/06/2021  Medication Sig   albuterol (VENTOLIN HFA) 108 (90 Base) MCG/ACT inhaler Inhale 2 puffs into the lungs every 6 (six) hours as needed for wheezing or shortness of breath. (Patient not taking: Reported on 01/30/2021)   amLODipine (NORVASC) 10 MG tablet Take 1 tablet (10 mg total) by mouth daily. (Patient not taking: Reported on 01/30/2021)   aspirin EC 81 MG EC tablet Take 1 tablet (81 mg total) by mouth daily.   cetirizine (ZYRTEC) 10 MG chewable tablet Chew 10 mg by mouth daily as needed for allergies.   cholecalciferol (VITAMIN D) 1000 UNITS tablet Take 1,000 Units by mouth daily. (Patient not taking: Reported on 01/30/2021)   clopidogrel (PLAVIX) 75 MG tablet TAKE 1 TABLET BY MOUTH DAILY.   cyclobenzaprine (FLEXERIL) 10 MG tablet TAKE 1 TABLET BY MOUTH TWICE DAILY AS NEEDED FOR MUSCLE SPASMS (Patient not taking: Reported on 01/30/2021)   diazepam (VALIUM) 2 MG tablet TAKE 1 TABLET BY MOUTH EVERY 8 HOURS AS NEEDED FOR MUSCLE SPASMS (Patient not taking: Reported on 01/30/2021)   ezetimibe (ZETIA) 10 MG tablet TAKE 1 TABLET BY MOUTH ONCE A DAY   fenofibrate (TRICOR) 145 MG tablet Take 1 tablet (145 mg total) by mouth  daily.   FLUoxetine (PROZAC) 20 MG capsule TAKE 1 CAPSULE BY MOUTH ONCE DAILY   fluticasone (FLONASE) 50 MCG/ACT nasal spray Place 1 spray into both nostrils daily as needed for allergies or rhinitis. (Patient not taking: Reported on 01/30/2021)   fluticasone (FLOVENT HFA) 110 MCG/ACT inhaler Inhale 2 puffs into the lungs in the morning and at bedtime. with spacer and rinse mouth afterwards.   icosapent Ethyl (VASCEPA) 1 g capsule TAKE 2 CAPSULES BY MOUTH TWICE DAILY    isosorbide mononitrate (IMDUR) 30 MG 24 hr tablet Take 1 tablet (30 mg total) by mouth daily.   metFORMIN (GLUCOPHAGE-XR) 500 MG 24 hr tablet Take 1 tablet (500 mg total) by mouth daily with breakfast. (Patient not taking: Reported on 01/30/2021)   metoprolol succinate (TOPROL-XL) 25 MG 24 hr tablet TAKE 1/2 OF A TABLET BY MOUTH DAILY.   Multiple Vitamin (MULTIVITAMIN WITH MINERALS) TABS tablet Take 1 tablet by mouth daily.   nitroGLYCERIN (NITROSTAT) 0.4 MG SL tablet Place 1 tablet under the tongue every 5 minutes as needed for chest pain.   nystatin (MYCOSTATIN/NYSTOP) powder Apply 1 application topically 3 (three) times daily. (Patient not taking: Reported on 01/30/2021)   simvastatin (ZOCOR) 40 MG tablet TAKE 1 TABLET BY MOUTH DAILY.   Spacer/Aero-Holding Chambers (AEROCHAMBER PLUS) inhaler Use as instructed   telmisartan (MICARDIS) 40 MG tablet TAKE 1 TABLET BY MOUTH EVERY MORNING * NEEDS OFFICE VISIT   [DISCONTINUED] rosuvastatin (CRESTOR) 20 MG tablet Take 1 tablet (20 mg total) by mouth daily.   Facility-Administered Encounter Medications as of 02/06/2021  Medication   sodium chloride flush (NS) 0.9 % injection 3 mL    Allergies (verified) Crestor [rosuvastatin] and Niacin and related   History: Past Medical History:  Diagnosis Date   Anemia    Arthritis    Bronchitis    hx of    CAD (coronary artery disease)    a. s/p DES to LAD in 2010 with patent stent by cath in 2014 b. low-risk NST in 11/2016   Cataracts, bilateral    Diabetes mellitus without complication (HCC)    Family history of adverse reaction to anesthesia    pts mother had difficulty awakening    GERD (gastroesophageal reflux disease)    Hyperlipidemia LDL goal < 70    Hypertension    Lumbar back pain    Numbness    left leg and foot    Plantar fascia rupture    Left Foot   Sleep apnea    uses CPAP    Past Surgical History:  Procedure Laterality Date   ABDOMINAL HYSTERECTOMY     BACK SURGERY      BREAST CYST ASPIRATION  1995   CAROTID STENT  2009   pt denies    CORONARY ANGIOPLASTY WITH STENT PLACEMENT  2010and 10-02-2012   Stent DES, Xience to prox. LAD   DOPPLER ECHOCARDIOGRAPHY  08/01/2009   EF=>55%,LV normal   LEFT HEART CATH AND CORONARY ANGIOGRAPHY N/A 12/10/2019   Procedure: LEFT HEART CATH AND CORONARY ANGIOGRAPHY;  Surgeon: Lyn Records, MD;  Location: [facility];  Service: Cardiovascular;  Laterality: N/A;   LEFT HEART CATH AND CORONARY ANGIOGRAPHY N/A 10/30/2020   Procedure: LEFT HEART CATH AND CORONARY ANGIOGRAPHY;  Surgeon: Runell Gess, MD;  Location: [facility];  Service: Cardiovascular;  Laterality: N/A;   LEFT HEART CATHETERIZATION WITH CORONARY ANGIOGRAM N/A 10/02/2012   Procedure: LEFT HEART CATHETERIZATION WITH CORONARY ANGIOGRAM;  Surgeon: Runell Gess,  MD;  Location: [facility];  Service: Cardiovascular;  Laterality: N/A;   lower arterial duplex  06/20/10   abi's normal,rgt 0.98,lft 1.06;bilateral PVRs normal   LUMBAR LAMINECTOMY/DECOMPRESSION MICRODISCECTOMY Left 01/06/2013   Procedure: MICRO LUMBAR DECOMPRESSION L4-5 AND L5-S1;  Surgeon: Javier Docker, MD;  Location: [facility];  Service: Orthopedics;  Laterality: Left;   LUMBAR LAMINECTOMY/DECOMPRESSION MICRODISCECTOMY Left 10/12/2014   Procedure: REVISION MICRO LUMBAR/DECOMPRESSION L4-5 LEFT ;  Surgeon: Jene Every, MD;  Location: [facility];  Service: Orthopedics;  Laterality: Left;   NM MYOCAR PERF WALL MOTION  09/22/2008   lexiscan-EF 83%; glogal LV systolic fx is norm. ,evidence of mild ischemia basal anterior,midanterior and apical lateral region(s).    ORIF PATELLA Right 08/29/2016   Procedure: OPEN REDUCTION INTERNAL (ORIF) FIXATION RIGHT PATELLA;  Surgeon: Samson Frederic, MD;  Location: [facility];  Service: Orthopedics;  Laterality: Right;  Adductor Block   TUBAL LIGATION     TYMPANOPLASTY Bilateral    UVULOPALATOPHARYNGOPLASTY     pt denies    Family History  Problem Relation Age  of Onset   Coronary artery disease Mother    Rheum arthritis Mother    Dementia Mother    Heart attack Father    Hypertension Father    Hyperlipidemia Father    Other Father        MVA   Hypertension Brother    Cancer Brother    Heart disease Brother    Cancer Paternal Grandmother        stomach   Diabetes Paternal Grandfather    Breast cancer Neg Hx    Social History   Socioeconomic History   Marital status: Married    Spouse name: [name]   Number of children: 2   Years of education: MSN   Highest education level: Not on file  Occupational History   Occupation: DIRECTOR    Employer: [employer]  Tobacco Use   Smoking status: Never   Smokeless tobacco: Never  Vaping Use   Vaping Use: Never used  Substance and Sexual Activity   Alcohol use: Yes    Comment: occasional, 1 a month wine   Drug use: No   Sexual activity: Yes  Other Topics Concern   Not on file  Social History Narrative      Married to [place]. Education: [employer]. Exercise: Yes   Patient lives at home with her spouse. retired   Caffeine use: 1 drink of tea a day   Social Determinants of Corporate investment banker Strain: Low Risk    Difficulty of Paying Living Expenses: Not very hard  Food Insecurity: Not on file  Transportation Needs: Not on file  Physical Activity: Insufficiently Active   Days of Exercise per Week: 3 days   Minutes of Exercise per Session: 30 min  Stress: Not on file  Social Connections: Not on file    Tobacco Counseling Counseling given: Not Answered   Clinical Intake:                 Diabetes:  Is the patient diabetic?  Yes  If diabetic, was a CBG obtained today?  No  Did the patient bring in their glucometer from home?  No phone visit How often do you monitor your CBG's? ***.   Financial Strains and Diabetes Management:  Are you having any financial strains with the device, your supplies or your medication? {YES/NO:21197}.  Does the patient want to  be seen by Chronic Care Management for management of  their diabetes?  {YES/NO:21197} Would the patient like to be referred to a Nutritionist or for Diabetic Management?  {YES/NO:21197}  Diabetic Exams:  Diabetic Eye Exam: Completed 03/02/2020.   Diabetic Foot Exam: Completed 06/19/2020.           Activities of Daily Living In your present state of health, do you have any difficulty performing the following activities: 10/27/2020  Hearing? Y  Vision? N  Difficulty concentrating or making decisions? N  Walking or climbing stairs? Y  Dressing or bathing? N  Doing errands, shopping? Y  Some recent data might be hidden    Patient Care Team: Copland, [name], MD as PCP - General (Family Medicine) Rennis Golden Lisette Abu, MD as PCP - Cardiology (Cardiology) Henrene Pastor, RPH-CPP (Pharmacist)  Indicate any recent Medical Services you may have received from other than [facility] providers in the past year (date may be approximate).     Assessment:   This is a routine wellness examination for [place].  Hearing/Vision screen No results found.  Dietary issues and exercise activities discussed:     Goals Addressed   None    Depression Screen PHQ 2/9 Scores 12/30/2019 07/01/2019 05/19/2017 10/02/2015 06/05/2015 01/18/2015 09/29/2014  PHQ - 2 Score 2 0 1 0 0 0 0  PHQ- 9 Score 8 - - - - - -    Fall Risk Fall Risk  06/19/2020 07/01/2019 11/03/2017 08/25/2017 05/19/2017  Falls in the past year? 1 0 No No Yes  Comment - - - - -  Number falls in past yr: 1 - - - 1  Injury with Fall? 0 - - - Yes  Comment - - - - -  Risk for fall due to : - - History of fall(s);Impaired balance/gait - Other (Comment)  Risk for fall due to: Comment - - - - -  Follow up Falls evaluation completed - - - -    FALL RISK PREVENTION PERTAINING TO THE HOME:  Any stairs in or around the home? {YES/NO:21197} If so, are there any without handrails? {YES/NO:21197} Home free of loose throw rugs in walkways, pet beds, electrical  cords, etc? {YES/NO:21197} Adequate lighting in your home to reduce risk of falls? {YES/NO:21197}  ASSISTIVE DEVICES UTILIZED TO PREVENT FALLS:  Life alert? {YES/NO:21197} Use of a cane, walker or w/c? {YES/NO:21197} Grab bars in the bathroom? {YES/NO:21197} Shower chair or bench in shower? {YES/NO:21197} Elevated toilet seat or a handicapped toilet? {YES/NO:21197}  TIMED UP AND GO:  Was the test performed? {YES/NO:21197}.  Length of time to ambulate 10 feet: *** sec.   {Appearance of Gait:2101803}  Cognitive Function: MMSE - Mini Mental State Exam 12/20/2013  Orientation to time 5  Orientation to Place 5  Registration 3  Attention/ Calculation 5  Recall 3  Language- name 2 objects 2  Language- repeat 1  Language- follow 3 step command 3  Language- read & follow direction 1  Write a sentence 1  Copy design 1  Total score 30   Montreal Cognitive Assessment  09/09/2014 03/01/2014 12/20/2013  Visuospatial/ Executive (0/5) 3 2 2   Naming (0/3) 3 3 3   Attention: Read list of digits (0/2) 2 2 2   Attention: Read list of letters (0/1) 1 1 1   Attention: Serial 7 subtraction starting at 100 (0/3) 3 3 3   Language: Repeat phrase (0/2) 2 1 2   Language : Fluency (0/1) 1 1 0  Abstraction (0/2) 2 2 1   Delayed Recall (0/5) 2 3 4   Orientation (0/6) 6 6 6  Total Adjusted Score (based on education) Immunizations Immunization History  Administered Date(s) Administered   Fluad Quad(high Dose 65+) 11/16/2018, 12/20/2019, 12/20/2020   Influenza Split 01/11/2016   Influenza,inj,Quad PF,6+ Mos 12/01/2013, 01/18/2015, 01/16/2018   Influenza,inj,quad, With Preservative 01/08/2017   Influenza-Unspecified 01/23/2018   PFIZER Comirnaty(Gray Top)Covid-19 Tri-Sucrose Vaccine 07/27/2020   PFIZER(Purple Top)SARS-COV-2 Vaccination 04/20/2019, 05/13/2019, 12/21/2019   Pfizer Covid-19 Vaccine Bivalent Booster 19yrs & up 12/20/2020   Pneumococcal Conjugate-13 01/18/2015    Pneumococcal Polysaccharide-23 07/12/2010, 04/21/2017   Td 11/16/2018   Tdap 09/15/2008   Zoster Recombinat (Shingrix) 01/16/2018, 03/23/2018    TDAP status: Up to date  {Flu Vaccine status:2101806}  Pneumococcal vaccine status: Up to date  Covid-19 vaccine status: Completed vaccines  Qualifies for Shingles Vaccine? No   Zostavax completed No   Shingrix Completed?: Yes  Screening Tests Health Maintenance  Topic Date Due   OPHTHALMOLOGY EXAM  03/02/2021   HEMOGLOBIN A1C  05/01/2021   FOOT EXAM  06/19/2021   COLONOSCOPY (Pts 45-29yrs Insurance coverage will need to be confirmed)  01/06/2022   MAMMOGRAM  04/07/2022   TETANUS/TDAP  11/15/2028   Pneumonia Vaccine 90+ Years old  Completed   INFLUENZA VACCINE  Completed   DEXA SCAN  Completed   COVID-19 Vaccine  Completed   Hepatitis C Screening  Completed   Zoster Vaccines- Shingrix  Completed   HPV VACCINES  Aged Out    Health Maintenance  There are no preventive care reminders to display for this patient.  Colorectal cancer screening: Type of screening: Colonoscopy. Completed 01/07/2015. Repeat every 7 years  Mammogram status: Completed bilateral 04/07/2020. Repeat every year  Bone Density status: Scheduled for 02/20/2021  Lung Cancer Screening: (Low Dose CT Chest recommended if Age 58-80 years, 30 pack-year currently smoking OR have quit w/in 15years.) does not qualify.     Additional Screening:  Hepatitis C Screening: Completed 11/26/2012  Vision Screening: Recommended annual ophthalmology exams for early detection of glaucoma and other disorders of the eye. Is the patient up to date with their annual eye exam?  Yes  Who is the provider or what is the name of the office in which the patient attends annual eye exams? ***   Dental Screening: Recommended annual dental exams for proper oral hygiene  Community Resource Referral / Chronic Care Management: CRR required this visit?  {YES/NO:21197}  CCM required this  visit?  {YES/NO:21197}     Plan:     I have personally reviewed and noted the following in the patient's chart:   Medical and social history Use of alcohol, tobacco or illicit drugs  Current medications and supplements including opioid prescriptions. {Opioid Prescriptions:[phone]} Functional ability and status Nutritional status Physical activity Advanced directives List of other physicians Hospitalizations, surgeries, and ER visits in previous 12 months Vitals Screenings to include cognitive, depression, and falls Referrals and appointments  In addition, I have reviewed and discussed with patient certain preventive protocols, quality metrics, and best practice recommendations. A written personalized care plan for preventive services as well as general preventive health recommendations were provided to patient.   Due to this being a telephonic visit, the after visit summary with patients personalized plan was offered to patient via mail or my-chart. ***Patient declined at this time./ Patient would like to access on my-chart/ per request, patient was mailed a copy of AVS./ Patient preferred to pick up at office at next visit.   Roanna Raider, LPN  02/06/2021  Nurse Health Advisor  Nurse Notes: ***

## 2021-02-08 ENCOUNTER — Other Ambulatory Visit: Payer: Self-pay | Admitting: Family Medicine

## 2021-02-08 ENCOUNTER — Other Ambulatory Visit (HOSPITAL_COMMUNITY): Payer: Self-pay

## 2021-02-08 ENCOUNTER — Other Ambulatory Visit (HOSPITAL_BASED_OUTPATIENT_CLINIC_OR_DEPARTMENT_OTHER): Payer: Self-pay | Admitting: Family Medicine

## 2021-02-08 DIAGNOSIS — M8580 Other specified disorders of bone density and structure, unspecified site: Secondary | ICD-10-CM

## 2021-02-08 DIAGNOSIS — F411 Generalized anxiety disorder: Secondary | ICD-10-CM

## 2021-02-08 MED FILL — Icosapent Ethyl Cap 1 GM: ORAL | 30 days supply | Qty: 120 | Fill #2 | Status: AC

## 2021-02-09 ENCOUNTER — Telehealth: Payer: Self-pay

## 2021-02-09 ENCOUNTER — Ambulatory Visit: Payer: PPO

## 2021-02-09 ENCOUNTER — Other Ambulatory Visit (HOSPITAL_COMMUNITY): Payer: Self-pay

## 2021-02-09 MED ORDER — DIAZEPAM 2 MG PO TABS
2.0000 mg | ORAL_TABLET | Freq: Three times a day (TID) | ORAL | 2 refills | Status: DC | PRN
  Filled 2021-02-09: qty 30, 10d supply, fill #0

## 2021-02-09 MED ORDER — FLUTICASONE PROPIONATE 50 MCG/ACT NA SUSP
1.0000 | Freq: Every day | NASAL | 11 refills | Status: AC | PRN
  Filled 2021-02-09: qty 16, 60d supply, fill #0
  Filled 2021-04-16: qty 16, 60d supply, fill #1
  Filled 2021-11-06: qty 16, 60d supply, fill #2
  Filled 2021-12-10: qty 16, 60d supply, fill #3

## 2021-02-09 NOTE — Progress Notes (Unsigned)
Subjective:   [name] is a 70 y.o. female who presents for an Initial Medicare Annual Wellness Visit.   I connected with [name] today by telephone and verified that I am speaking with the correct person using two identifiers. Location patient: home Location provider: work Persons participating in the virtual visit: patient, Engineer, civil (consulting).    I discussed the limitations, risks, security and privacy concerns of performing an evaluation and management service by telephone and the availability of in person appointments. I also discussed with the patient that there may be a patient responsible charge related to this service. The patient expressed understanding and verbally consented to this telephonic visit.    Interactive audio and video telecommunications were attempted between this provider and patient, however failed, due to patient having technical difficulties OR patient did not have access to video capability.  We continued and completed visit with audio only.  Some vital signs may be absent or patient reported.   Time Spent with patient on telephone encounter: *** minutes  Review of Systems    ***       Objective:    There were no vitals filed for this visit. There is no height or weight on file to calculate BMI.  Advanced Directives 10/27/2020 12/10/2019 01/31/2018 01/31/2018 08/29/2016 08/29/2016 08/27/2016  Does Patient Have a Medical Advance Directive? No No Yes Yes Yes Yes No  Type of Advance Directive - - Living will;Healthcare Power of Buyer, retail -  Does patient want to make changes to medical advance directive? - - Yes (ED - Information included in AVS) Yes (ED - Information included in AVS) No - Patient declined - -  Copy of Healthcare Power of Attorney in Chart? - - No - copy requested - No - copy requested - -  Would patient like information on creating a medical advance directive? No - Patient declined No - Patient declined  Yes (ED - Information included in AVS) Yes (ED - Information included in AVS) No - Patient declined - Yes (MAU/Ambulatory/Procedural Areas - Information given)  Pre-existing out of facility DNR order (yellow form or pink MOST form) - - - - - - -    Current Medications (verified) Outpatient Encounter Medications as of 02/09/2021  Medication Sig   albuterol (VENTOLIN HFA) 108 (90 Base) MCG/ACT inhaler Inhale 2 puffs into the lungs every 6 (six) hours as needed for wheezing or shortness of breath. (Patient not taking: Reported on 01/30/2021)   amLODipine (NORVASC) 10 MG tablet Take 1 tablet (10 mg total) by mouth daily. (Patient not taking: Reported on 01/30/2021)   aspirin EC 81 MG EC tablet Take 1 tablet (81 mg total) by mouth daily.   cetirizine (ZYRTEC) 10 MG chewable tablet Chew 10 mg by mouth daily as needed for allergies.   cholecalciferol (VITAMIN D) 1000 UNITS tablet Take 1,000 Units by mouth daily. (Patient not taking: Reported on 01/30/2021)   clopidogrel (PLAVIX) 75 MG tablet TAKE 1 TABLET BY MOUTH DAILY.   cyclobenzaprine (FLEXERIL) 10 MG tablet TAKE 1 TABLET BY MOUTH TWICE DAILY AS NEEDED FOR MUSCLE SPASMS (Patient not taking: Reported on 01/30/2021)   diazepam (VALIUM) 2 MG tablet Take 1 tablet (2 mg total) by mouth every 8 (eight) hours as needed for muscle spasms.   ezetimibe (ZETIA) 10 MG tablet TAKE 1 TABLET BY MOUTH ONCE A DAY   fenofibrate (TRICOR) 145 MG tablet Take 1 tablet (145 mg total) by mouth daily.  FLUoxetine (PROZAC) 20 MG capsule TAKE 1 CAPSULE BY MOUTH ONCE DAILY   fluticasone (FLONASE) 50 MCG/ACT nasal spray Place 1 spray into both nostrils daily as needed for allergies or rhinitis.   fluticasone (FLOVENT HFA) 110 MCG/ACT inhaler Inhale 2 puffs into the lungs in the morning and at bedtime. with spacer and rinse mouth afterwards.   icosapent Ethyl (VASCEPA) 1 g capsule TAKE 2 CAPSULES BY MOUTH TWICE DAILY   isosorbide mononitrate (IMDUR) 30 MG 24 hr tablet Take 1  tablet (30 mg total) by mouth daily.   metFORMIN (GLUCOPHAGE-XR) 500 MG 24 hr tablet Take 1 tablet (500 mg total) by mouth daily with breakfast. (Patient not taking: Reported on 01/30/2021)   metoprolol succinate (TOPROL-XL) 25 MG 24 hr tablet TAKE 1/2 OF A TABLET BY MOUTH DAILY.   Multiple Vitamin (MULTIVITAMIN WITH MINERALS) TABS tablet Take 1 tablet by mouth daily.   nitroGLYCERIN (NITROSTAT) 0.4 MG SL tablet Place 1 tablet under the tongue every 5 minutes as needed for chest pain.   nystatin (MYCOSTATIN/NYSTOP) powder Apply 1 application topically 3 (three) times daily. (Patient not taking: Reported on 01/30/2021)   simvastatin (ZOCOR) 40 MG tablet TAKE 1 TABLET BY MOUTH DAILY.   Spacer/Aero-Holding Chambers (AEROCHAMBER PLUS) inhaler Use as instructed   telmisartan (MICARDIS) 40 MG tablet TAKE 1 TABLET BY MOUTH EVERY MORNING * NEEDS OFFICE VISIT   [DISCONTINUED] rosuvastatin (CRESTOR) 20 MG tablet Take 1 tablet (20 mg total) by mouth daily.   Facility-Administered Encounter Medications as of 02/09/2021  Medication   sodium chloride flush (NS) 0.9 % injection 3 mL    Allergies (verified) Crestor [rosuvastatin] and Niacin and related   History: Past Medical History:  Diagnosis Date   Anemia    Arthritis    Bronchitis    hx of    CAD (coronary artery disease)    a. s/p DES to LAD in 2010 with patent stent by cath in 2014 b. low-risk NST in 11/2016   Cataracts, bilateral    Diabetes mellitus without complication (HCC)    Family history of adverse reaction to anesthesia    pts mother had difficulty awakening    GERD (gastroesophageal reflux disease)    Hyperlipidemia LDL goal < 70    Hypertension    Lumbar back pain    Numbness    left leg and foot    Plantar fascia rupture    Left Foot   Sleep apnea    uses CPAP    Past Surgical History:  Procedure Laterality Date   ABDOMINAL HYSTERECTOMY     BACK SURGERY     BREAST CYST ASPIRATION  1995   CAROTID STENT  2009   pt  denies    CORONARY ANGIOPLASTY WITH STENT PLACEMENT  2010and 10-02-2012   Stent DES, Xience to prox. LAD   DOPPLER ECHOCARDIOGRAPHY  08/01/2009   EF=>55%,LV normal   LEFT HEART CATH AND CORONARY ANGIOGRAPHY N/A 12/10/2019   Procedure: LEFT HEART CATH AND CORONARY ANGIOGRAPHY;  Surgeon: Lyn Records, MD;  Location: [facility];  Service: Cardiovascular;  Laterality: N/A;   LEFT HEART CATH AND CORONARY ANGIOGRAPHY N/A 10/30/2020   Procedure: LEFT HEART CATH AND CORONARY ANGIOGRAPHY;  Surgeon: Runell Gess, MD;  Location: [facility];  Service: Cardiovascular;  Laterality: N/A;   LEFT HEART CATHETERIZATION WITH CORONARY ANGIOGRAM N/A 10/02/2012   Procedure: LEFT HEART CATHETERIZATION WITH CORONARY ANGIOGRAM;  Surgeon: Runell Gess, MD;  Location: [facility] CATH [facility];  Service: Cardiovascular;  Laterality: N/A;   lower arterial duplex  06/20/10   abi's normal,rgt 0.98,lft 1.06;bilateral PVRs normal   LUMBAR LAMINECTOMY/DECOMPRESSION MICRODISCECTOMY Left 01/06/2013   Procedure: MICRO LUMBAR DECOMPRESSION L4-5 AND L5-S1;  Surgeon: Javier Docker, MD;  Location: [facility];  Service: Orthopedics;  Laterality: Left;   LUMBAR LAMINECTOMY/DECOMPRESSION MICRODISCECTOMY Left 10/12/2014   Procedure: REVISION MICRO LUMBAR/DECOMPRESSION L4-5 LEFT ;  Surgeon: Jene Every, MD;  Location: [facility];  Service: Orthopedics;  Laterality: Left;   NM MYOCAR PERF WALL MOTION  09/22/2008   lexiscan-EF 83%; glogal LV systolic fx is norm. ,evidence of mild ischemia basal anterior,midanterior and apical lateral region(s).    ORIF PATELLA Right 08/29/2016   Procedure: OPEN REDUCTION INTERNAL (ORIF) FIXATION RIGHT PATELLA;  Surgeon: Samson Frederic, MD;  Location: [facility];  Service: Orthopedics;  Laterality: Right;  Adductor Block   TUBAL LIGATION     TYMPANOPLASTY Bilateral    UVULOPALATOPHARYNGOPLASTY     pt denies    Family History  Problem Relation Age of Onset   Coronary artery disease Mother    Rheum  arthritis Mother    Dementia Mother    Heart attack Father    Hypertension Father    Hyperlipidemia Father    Other Father        MVA   Hypertension Brother    Cancer Brother    Heart disease Brother    Cancer Paternal Grandmother        stomach   Diabetes Paternal Grandfather    Breast cancer Neg Hx    Social History   Socioeconomic History   Marital status: Married    Spouse name: [name]   Number of children: 2   Years of education: MSN   Highest education level: Not on file  Occupational History   Occupation: DIRECTOR    Employer: [employer]  Tobacco Use   Smoking status: Never   Smokeless tobacco: Never  Vaping Use   Vaping Use: Never used  Substance and Sexual Activity   Alcohol use: Yes    Comment: occasional, 1 a month wine   Drug use: No   Sexual activity: Yes  Other Topics Concern   Not on file  Social History Narrative      Married to [place]. Education: [employer]. Exercise: Yes   Patient lives at home with her spouse. retired   Caffeine use: 1 drink of tea a day   Social Determinants of Corporate investment banker Strain: Low Risk    Difficulty of Paying Living Expenses: Not very hard  Food Insecurity: Not on file  Transportation Needs: Not on file  Physical Activity: Insufficiently Active   Days of Exercise per Week: 3 days   Minutes of Exercise per Session: 30 min  Stress: Not on file  Social Connections: Not on file    Tobacco Counseling Counseling given: Not Answered   Clinical Intake:                 Diabetes:  Is the patient diabetic?  Yes  If diabetic, was a CBG obtained today?  No  Did the patient bring in their glucometer from home?  No phone visit How often do you monitor your CBG's? ***.   Financial Strains and Diabetes Management:  Are you having any financial strains with the device, your supplies or your medication? {YES/NO:21197}.  Does the patient want to be seen by Chronic Care Management for management of  their diabetes?  {YES/NO:21197} Would the patient like to be  referred to a Nutritionist or for Diabetic Management?  {YES/NO:21197}  Diabetic Exams:  Diabetic Eye Exam: Completed 03/02/2020.   Diabetic Foot Exam: Completed 06/19/2020.          Activities of Daily Living In your present state of health, do you have any difficulty performing the following activities: 10/27/2020  Hearing? Y  Vision? N  Difficulty concentrating or making decisions? N  Walking or climbing stairs? Y  Dressing or bathing? N  Doing errands, shopping? Y  Some recent data might be hidden    Patient Care Team: Copland, [name], MD as PCP - General (Family Medicine) Rennis Golden Lisette Abu, MD as PCP - Cardiology (Cardiology) Henrene Pastor, RPH-CPP (Pharmacist)  Indicate any recent Medical Services you may have received from other than [facility] providers in the past year (date may be approximate).     Assessment:   This is a routine wellness examination for [place].  Hearing/Vision screen No results found.  Dietary issues and exercise activities discussed:     Goals Addressed   None    Depression Screen PHQ 2/9 Scores 12/30/2019 07/01/2019 05/19/2017 10/02/2015 06/05/2015 01/18/2015 09/29/2014  PHQ - 2 Score 2 0 1 0 0 0 0  PHQ- 9 Score 8 - - - - - -    Fall Risk Fall Risk  06/19/2020 07/01/2019 11/03/2017 08/25/2017 05/19/2017  Falls in the past year? 1 0 No No Yes  Comment - - - - -  Number falls in past yr: 1 - - - 1  Injury with Fall? 0 - - - Yes  Comment - - - - -  Risk for fall due to : - - History of fall(s);Impaired balance/gait - Other (Comment)  Risk for fall due to: Comment - - - - -  Follow up Falls evaluation completed - - - -    FALL RISK PREVENTION PERTAINING TO THE HOME:  Any stairs in or around the home? {YES/NO:21197} If so, are there any without handrails? {YES/NO:21197} Home free of loose throw rugs in walkways, pet beds, electrical cords, etc? {YES/NO:21197} Adequate lighting in your  home to reduce risk of falls? {YES/NO:21197}  ASSISTIVE DEVICES UTILIZED TO PREVENT FALLS:  Life alert? {YES/NO:21197} Use of a cane, walker or w/c? {YES/NO:21197} Grab bars in the bathroom? {YES/NO:21197} Shower chair or bench in shower? {YES/NO:21197} Elevated toilet seat or a handicapped toilet? {YES/NO:21197}  TIMED UP AND GO:  Was the test performed? {YES/NO:21197}.  Length of time to ambulate 10 feet: *** sec.   {Appearance of Gait:2101803}  Cognitive Function: MMSE - Mini Mental State Exam 12/20/2013  Orientation to time 5  Orientation to Place 5  Registration 3  Attention/ Calculation 5  Recall 3  Language- name 2 objects 2  Language- repeat 1  Language- follow 3 step command 3  Language- read & follow direction 1  Write a sentence 1  Copy design 1  Total score 30   Montreal Cognitive Assessment  09/09/2014 03/01/2014 12/20/2013  Visuospatial/ Executive (0/5) 3 2 2   Naming (0/3) 3 3 3   Attention: Read list of digits (0/2) 2 2 2   Attention: Read list of letters (0/1) 1 1 1   Attention: Serial 7 subtraction starting at 100 (0/3) 3 3 3   Language: Repeat phrase (0/2) 2 1 2   Language : Fluency (0/1) 1 1 0  Abstraction (0/2) 2 2 1   Delayed Recall (0/5) 2 3 4   Orientation (0/6) 6 6 6   Total 25 24 24   Adjusted Score (based on education)  Immunizations Immunization History  Administered Date(s) Administered   Fluad Quad(high Dose 65+) 11/16/2018, 12/20/2019, 12/20/2020   Influenza Split 01/11/2016   Influenza,inj,Quad PF,6+ Mos 12/01/2013, 01/18/2015, 01/16/2018   Influenza,inj,quad, With Preservative 01/08/2017   Influenza-Unspecified 01/23/2018   PFIZER Comirnaty(Gray Top)Covid-19 Tri-Sucrose Vaccine 07/27/2020   PFIZER(Purple Top)SARS-COV-2 Vaccination 04/20/2019, 05/13/2019, 12/21/2019   Pfizer Covid-19 Vaccine Bivalent Booster 85yrs & up 12/20/2020   Pneumococcal Conjugate-13 01/18/2015   Pneumococcal Polysaccharide-23 07/12/2010, 04/21/2017    Td 11/16/2018   Tdap 09/15/2008   Zoster Recombinat (Shingrix) 01/16/2018, 03/23/2018    TDAP status: Up to date  Flu Vaccine status: Up to date  Pneumococcal vaccine status: Up to date  Covid-19 vaccine status: Completed vaccines  Qualifies for Shingles Vaccine? No   Zostavax completed No   Shingrix Completed?: Yes  Screening Tests Health Maintenance  Topic Date Due   OPHTHALMOLOGY EXAM  03/02/2021   HEMOGLOBIN A1C  05/01/2021   FOOT EXAM  06/19/2021   COLONOSCOPY (Pts 45-74yrs Insurance coverage will need to be confirmed)  01/06/2022   MAMMOGRAM  04/07/2022   TETANUS/TDAP  11/15/2028   Pneumonia Vaccine 47+ Years old  Completed   INFLUENZA VACCINE  Completed   DEXA SCAN  Completed   COVID-19 Vaccine  Completed   Hepatitis C Screening  Completed   Zoster Vaccines- Shingrix  Completed   HPV VACCINES  Aged Out    Health Maintenance  There are no preventive care reminders to display for this patient.  Colorectal cancer screening: Type of screening: Colonoscopy. Completed 01/07/2015. Repeat every 7 years  Mammogram status: Completed 04/07/2020-bilateral. Repeat every year  Bone Density status: Scheduled for 02/20/2021  Lung Cancer Screening: (Low Dose CT Chest recommended if Age 108-80 years, 30 pack-year currently smoking OR have quit w/in 15years.) does not qualify.     Additional Screening:  Hepatitis C Screening: Completed 11/26/2012  Vision Screening: Recommended annual ophthalmology exams for early detection of glaucoma and other disorders of the eye. Is the patient up to date with their annual eye exam?  Yes  Who is the provider or what is the name of the office in which the patient attends annual eye exams? ***   Dental Screening: Recommended annual dental exams for proper oral hygiene  Community Resource Referral / Chronic Care Management: CRR required this visit?  {YES/NO:21197}  CCM required this visit?  {YES/NO:21197}     Plan:     I have  personally reviewed and noted the following in the patient's chart:   Medical and social history Use of alcohol, tobacco or illicit drugs  Current medications and supplements including opioid prescriptions. {Opioid [phone]} Functional ability and status Nutritional status Physical activity Advanced directives List of other physicians Hospitalizations, surgeries, and ER visits in previous 12 months Vitals Screenings to include cognitive, depression, and falls Referrals and appointments  In addition, I have reviewed and discussed with patient certain preventive protocols, quality metrics, and best practice recommendations. A written personalized care plan for preventive services as well as general preventive health recommendations were provided to patient.   Due to this being a telephonic visit, the after visit summary with patients personalized plan was offered to patient via mail or my-chart. ***Patient declined at this time./ Patient would like to access on my-chart/ per request, patient was mailed a copy of AVS./ Patient preferred to pick up at office at next visit.   Roanna Raider, LPN   [id number]/12/9602  Nurse health Advisor  Nurse Notes: ***

## 2021-02-09 NOTE — Telephone Encounter (Signed)
Patient had a 3:40  appt for a telephone visit for her Medicare Wellness visit. Attempted x 4 to reach patient. Left message for pateint to call back to reschedule.

## 2021-02-13 ENCOUNTER — Other Ambulatory Visit (HOSPITAL_COMMUNITY): Payer: Self-pay

## 2021-02-13 ENCOUNTER — Ambulatory Visit (INDEPENDENT_AMBULATORY_CARE_PROVIDER_SITE_OTHER): Payer: PPO | Admitting: Pharmacist

## 2021-02-13 DIAGNOSIS — E118 Type 2 diabetes mellitus with unspecified complications: Secondary | ICD-10-CM

## 2021-02-13 DIAGNOSIS — M8580 Other specified disorders of bone density and structure, unspecified site: Secondary | ICD-10-CM

## 2021-02-13 DIAGNOSIS — Z9861 Coronary angioplasty status: Secondary | ICD-10-CM

## 2021-02-13 DIAGNOSIS — I10 Essential (primary) hypertension: Secondary | ICD-10-CM

## 2021-02-13 DIAGNOSIS — E785 Hyperlipidemia, unspecified: Secondary | ICD-10-CM

## 2021-02-13 DIAGNOSIS — F419 Anxiety disorder, unspecified: Secondary | ICD-10-CM

## 2021-02-13 MED ORDER — SIMVASTATIN 40 MG PO TABS
40.0000 mg | ORAL_TABLET | Freq: Every day | ORAL | 1 refills | Status: DC
  Filled 2021-02-13: qty 90, 90d supply, fill #0

## 2021-02-13 NOTE — Telephone Encounter (Signed)
CMA never reached patient. Closing out.

## 2021-02-13 NOTE — Chronic Care Management (AMB) (Signed)
Chronic Care Management Pharmacy Note  02/18/2021 Name:  JOCELYN NOLD MRN:  [id number] DOB:  08-Nov-1950  Summary:  Patient is holding alendronate due to chronic cough / reflux. Will have DEXA done in December 2022. Considering Reclast yearly or Prolia Q6 months pending 02/2021 results. Discussed fall prevention.  Reviewed meds. Assisted with refill for simvastatin (new Rx send in), metformin and fenofibrate.  Patient also wonders if she still needs fluoxetine. Started 4 years ago after fall when she was having anxiety. Will consult with PCP about possibly lowering dose of fluoxetine to 27m every OTHER day starting in January 2023 (after the holidays)   Plan:  Chronic Care Management follow up phone visit in Feb 2023  Patient to see PCP as planned March 2023.   Subjective: HSYNIAH BERNEis an 70y.o. year old female who is a primary patient of Copland, JGay Filler MD.  The CCM team was consulted for assistance with disease management and care coordination needs.    Engaged with patient by telephone for follow up visit in response to provider referral for pharmacy case management and/or care coordination services.   Consent to Services:  The patient was given information about Chronic Care Management services, agreed to services, and gave verbal consent prior to initiation of services.  Please see initial visit note for detailed documentation.   Patient Care Team: Copland, JGay Filler MD as PCP - General (Family Medicine) HDebara PickettKNadean Corwin MD as PCP - Cardiology (Cardiology) ECherre Robins RPH-CPP (Pharmacist)  Recent office visits: 12/20/20-Dr Copland (PCP) 6 month f/u visit. Flu vaccine given. Bone density ordered. F/u in 6 months. 11/02/20-Dr Copland (PCP) Hospital discharge f/u. Labs ordered. Referred to Pulmonology.  Recent consult visits: 01/30/2021 - Allergist (Dr YScherrie Bateman Seen for SOB / initial evaluation. Allergy test neg. Ordered panel for environmental allergies. Spirometry  showed restrictive disease. Started Flovent 1177m 2 puffs twice a day with spacer 12/21/20-(Pulmonology) Dr WeMelvyn NovasRecommend Chlortab 4 mg. F/U 3 months. 12/06/20-(Cardiology) Dr HiDebara PickettF/U chest pain. Discontinue fosamax and start Aciphex. Follow up in 6 months.. Marland Kitchen8/18/22-(Pulmonology) Dr WeMelvyn NovasStopped Fosamax and calcium carbonate. Start on Aciphex 20 mg Take 30-60 min before first meal of the day and pepcid AC 20 mg after supper. Follow up in 6 weeks. 10/18/20-(Cardiology) CaLaurann MontanaNP. Cardiac follow up. EKG ordered. 10/26/2020 to [facility]. No [facility] changes noted at [facility].  08/18/2020 - Cardio (Dr HiDebara PickettMyNunnessage - started fenofibrate 14546maily due to elevated Tg.  05/17/2020 - Cardio (Dr HilDebara Pickettyperlipidemia; Unable to tolerate rosuvastaitn - myalgias. Taking simvastatin 76m87mily. Added ezetimibe 10mg41mly  Hospital visits: None in previous 6 months  Objective:  Lab Results  Component Value Date   CREATININE 1.06 11/02/2020   CREATININE 1.24 (H) 10/30/2020   CREATININE 0.98 10/29/2020    Lab Results  Component Value Date   HGBA1C 7.3 (H) 10/29/2020   Last diabetic Eye exam:  Lab Results  Component Value Date/Time   HMDIABEYEEXA No Retinopathy 07/27/2015 09:06 AM    Last diabetic Foot exam: No results found for: HMDIABFOOTEX      Component Value Date/Time   CHOL 163 12/06/2020 0833   TRIG 112 12/06/2020 0833   HDL 41 12/06/2020 0833   CHOLHDL 4.0 12/06/2020 0833   CHOLHDL 6.3 (H) 12/20/2019 0945   VLDL 30.8 07/01/2019 0921   LDLCALC 102 (H) 12/06/2020 0833   LDLCAGrand Meadow27/2021 0945     Comment:     .  LDL cholesterol not calculated. Triglyceride levels greater than 400 mg/dL invalidate calculated LDL results. . Reference range: <100 . Desirable range <100 mg/dL for primary prevention;   <70 mg/dL for patients with CHD or diabetic patients  with > or = 2 CHD risk  factors. Marland Kitchen LDL-C is now calculated using the Martin-Hopkins  calculation, which is a validated novel method providing  better accuracy than the Friedewald equation in the  estimation of LDL-C.  Cresenciano Genre et al. Annamaria Helling. 8850;277(41): 2061-2068  (http://education.[employer].com/faq/FAQ164)    LDLDIRECT 82.0 11/16/2018 1010    Hepatic Function Latest Ref Rng & Units 05/17/2020 12/20/2019 07/01/2019  Total Protein 6.0 - 8.5 g/dL 6.6 6.7 6.3  Albumin 3.8 - 4.8 g/dL 4.2 - 4.0  AST 0 - 40 IU/L '12 16 15  ' ALT 0 - 32 IU/L '14 16 24  ' Alk Phosphatase 44 - 121 IU/L 109 - 87  Total Bilirubin 0.0 - 1.2 mg/dL 0.3 0.5 0.5  Bilirubin, Direct 0.00 - 0.40 mg/dL <0.10 0.1 -    Lab Results  Component Value Date/Time   TSH 2.35 07/01/2019 09:21 AM   TSH 3.11 01/25/2016 09:49 AM    CBC Latest Ref Rng & Units 12/21/2020 10/26/2020 06/19/2020  WBC 4.0 - 10.5 K/uL 8.5 11.5(H) 9.4  Hemoglobin 12.0 - 15.0 g/dL 11.4(L) 12.3 12.0  Hematocrit 36.0 - 46.0 % 36.1 38.9 37.4  Platelets 150.0 - 400.0 K/uL 333.0 396 292.0    Lab Results  Component Value Date/Time   VD25OH 39.17 07/18/2016 08:53 AM    Clinical ASCVD: Yes  The ASCVD Risk score (Arnett DK, et al., 2019) failed to calculate for the following reasons:   The patient has a prior MI or stroke diagnosis    DEXA 02/12/2019 The BMD measured at Femur Neck Right is 0.764 g/cm2 with a T-score of -2.0.    There has been a statistically significant decrease in BMD of Lumbar spine, and no change in BMD of Total Mean hip since prior exam dated 11/10/2015. The scan quality is good. L-3 and L-4 were excluded due to degenerative changes.   Site Region Measured Date Measured Age YA BMD Significant CHANGE T-score   DualFemur Neck Right 02/02/2019       -2.0     DualFemur Neck Right 11/10/2015       -1.8       AP Spine L1-L2 02/02/2019  -1.3  AP Spine  L1-L2 08/18/201   -0.8       DualFemur Total Mean 02/02/2019     -0.7     DualFemur Total Mean  11/10/2015     -0.9       Low bone mass and a 10 year probability of a hip fracture is 3.3% and probability of a major osteoporosis-related fracture 16.5%  Social History   Tobacco Use  Smoking Status Never  Smokeless Tobacco Never   BP Readings from Last 3 Encounters:  01/30/21 140/78  12/21/20 (!) 120/58  12/20/20 118/62   Pulse Readings from Last 3 Encounters:  01/30/21 (!) 52  12/21/20 (!) 58  12/20/20 (!) 59   Wt Readings from Last 3 Encounters:  01/30/21 205 lb (93 kg)  12/21/20 204 lb (92.5 kg)  12/20/20 204 lb 12.8 oz (92.9 kg)    Assessment: Review of patient past medical history, allergies, medications, health status, including review of consultants reports, laboratory and other test data, was performed as part of comprehensive evaluation and provision of chronic care management services.   SDOH:  (  Social Determinants of Health) assessments and interventions performed:    CCM Care Plan  Allergies  Allergen Reactions   Crestor [Rosuvastatin] Other (See Comments)    myalgia   Niacin And Related Other (See Comments)    Whelps and skin flushed, mouth tingling    Medications Reviewed Today     Reviewed by [name], RPH-CPP (Pharmacist) on 02/13/21 at [facility]  Med List Status: <None>   Medication Order Taking? Sig Documenting Provider Last Dose Status Informant  albuterol (VENTOLIN HFA) 108 (90 Base) MCG/ACT inhaler [id number] Yes Inhale 2 puffs into the lungs every 6 (six) hours as needed for wheezing or shortness of breath. Copland, Gay Filler, MD Taking Active   amLODipine (NORVASC) 10 MG tablet [id number] Yes Take 1 tablet (10 mg total) by mouth daily. Leanor Kail, PA Taking Active   aspirin EC 81 MG EC tablet [id number] Yes Take 1 tablet (81 mg total) by mouth daily. Rod Can, MD Taking Active Self           Med Note Antony Contras, [place] B   Tue Feb 13, 2021  9:18 AM)    chlorpheniramine (CHLOR-TRIMETON) 4 MG tablet [id number] Yes Take 4 mg by mouth  daily. [provider] Taking Active   cholecalciferol (VITAMIN D) 1000 UNITS tablet [id number] Yes Take 1,000 Units by mouth daily. [provider] Taking Active Self  clopidogrel (PLAVIX) 75 MG tablet [id number] Yes TAKE 1 TABLET BY MOUTH DAILY. Pixie Casino, MD Taking Active Self  cyclobenzaprine (FLEXERIL) 10 MG tablet [id number] No TAKE 1 TABLET BY MOUTH TWICE DAILY AS NEEDED FOR MUSCLE SPASMS  Patient not taking: Reported on 01/30/2021   Copland, Gay Filler, MD Not Taking Active Self           Med Note Gentry Roch   Tue Dec 07, 2019  8:58 AM)    diazepam (VALIUM) 2 MG tablet [id number] Yes Take 1 tablet (2 mg total) by mouth every 8 (eight) hours as needed for muscle spasms. Copland, Gay Filler, MD Taking Active   ezetimibe (ZETIA) 10 MG tablet [id number] Yes TAKE 1 TABLET BY MOUTH ONCE A DAY Hilty, Nadean Corwin, MD Taking Active Self  famotidine (PEPCID) 10 MG tablet [id number] Yes Take 10 mg by mouth at bedtime. [provider] Taking Active   fenofibrate (TRICOR) 145 MG tablet [id number] Yes Take 1 tablet (145 mg total) by mouth daily. Pixie Casino, MD Taking Active Self  FLUoxetine (PROZAC) 20 MG capsule [id number] Yes TAKE 1 CAPSULE BY MOUTH ONCE DAILY Copland, Gay Filler, MD Taking Active   fluticasone (FLONASE) 50 MCG/ACT nasal spray [id number] Yes Place 1 spray into both nostrils daily as needed for allergies or rhinitis. Copland, Gay Filler, MD Taking Active   fluticasone (FLOVENT HFA) 110 MCG/ACT inhaler [id number] Yes Inhale 2 puffs into the lungs in the morning and at bedtime. with spacer and rinse mouth afterwards. Garnet Sierras, DO Taking Active   icosapent Ethyl (VASCEPA) 1 g capsule [id number] Yes TAKE 2 CAPSULES BY MOUTH TWICE DAILY  Patient taking differently: Take 1 g by mouth 2 (two) times daily.   Copland, Gay Filler, MD Taking Active Self           Med Note Antony Contras, [place] Feb 13, 2021  9:16 AM) Unable to take 2 g twice a day due to reflux /  gas  isosorbide mononitrate (IMDUR) 30 MG 24 hr tablet [id number] Yes Take 1 tablet (30 mg total) by mouth daily. Gilford Rile,  Martie Lee, NP Taking Active Self  metFORMIN (GLUCOPHAGE-XR) 500 MG 24 hr tablet [id number] Yes Take 1 tablet (500 mg total) by mouth daily with breakfast. Copland, Gay Filler, MD Taking Active Self  metoprolol succinate (TOPROL-XL) 25 MG 24 hr tablet [id number] Yes TAKE 1/2 OF A TABLET BY MOUTH DAILY. Copland, Gay Filler, MD Taking Active   Multiple Vitamin (MULTIVITAMIN WITH MINERALS) TABS tablet [id number] Yes Take 1 tablet by mouth daily. [provider] Taking Active Self  nitroGLYCERIN (NITROSTAT) 0.4 MG SL tablet [id number] No Place 1 tablet under the tongue every 5 minutes as needed for chest pain.  Patient not taking: Reported on 02/13/2021   Copland, Gay Filler, MD Not Taking Active   nystatin (MYCOSTATIN/NYSTOP) powder [id number] No Apply 1 application topically 3 (three) times daily.  Patient not taking: Reported on 01/30/2021   Copland, Gay Filler, MD Not Taking Active   RABEprazole (ACIPHEX) 20 MG tablet [id number] Yes Take 20 mg by mouth daily. 30 minutes prior to food [provider] Taking Active     Discontinued 02/27/20 2121 (Side effect (s))   simvastatin (ZOCOR) 40 MG tablet [id number]  Take 1 tablet (40 mg total) by mouth daily. [name], RPH-CPP  Active   sodium chloride flush (NS) 0.9 % injection 3 mL [id number]   Deberah Pelton, NP  Active   Spacer/Aero-Holding Chambers (AEROCHAMBER PLUS) inhaler [id number]  Use as instructed Garnet Sierras, DO  Active   telmisartan (MICARDIS) 40 MG tablet [id number] Yes TAKE 1 TABLET BY MOUTH EVERY MORNING * NEEDS OFFICE VISIT Pixie Casino, MD Taking Active Self            Patient Active Problem List   Diagnosis Date Noted   Shortness of breath 01/30/2021   Heartburn 01/30/2021   Chronic rhinitis 01/30/2021   Upper airway cough syndrome 11/09/2020   DOE (dyspnea on exertion) 11/09/2020   AKI (acute  kidney injury) ([place]) 10/30/2020   Obstructive sleep apnea treated with continuous positive airway pressure (CPAP) 05/07/2018   Chest pain 01/31/2018   Morbid obesity ([place]) 11/03/2017   Coronary artery disease involving native coronary artery of native heart with unstable angina pectoris ([place]) 11/03/2017   Insomnia 11/03/2017   Inadequate sleep hygiene 11/03/2017   Anxiety 07/21/2017   Dizziness 10/02/2016   Right patella fracture 08/29/2016   Acquired foot deformity, left 07/18/2016   Osteopenia 11/10/2015   HNP (herniated nucleus pulposus), lumbar 10/12/2014   Spinal stenosis at L4-L5 level 10/12/2014   Iron deficiency anemia 07/29/2014   DM (diabetes mellitus) with complications ([place]) 73/53/2992   Environmental and seasonal allergies 04/28/2014   Spinal stenosis, lumbar region, with neurogenic claudication 01/06/2013   Obesity, morbid, BMI 40.0-49.9 ([place]) 10/20/2012   Chest pain with moderate risk of acute coronary syndrome 10/02/2012   CAD S/P percutaneous coronary angioplasty    Hyperlipidemia with target LDL less than 70    Essential hypertension 04/16/2012    Immunization History  Administered Date(s) Administered   Fluad Quad(high Dose 65+) 11/16/2018, 12/20/2019, 12/20/2020   Influenza Split 01/11/2016   Influenza,inj,Quad PF,6+ Mos 12/01/2013, 01/18/2015, 01/16/2018   Influenza,inj,quad, With Preservative 01/08/2017   Influenza-Unspecified 01/23/2018   PFIZER Comirnaty(Gray Top)Covid-19 Tri-Sucrose Vaccine 07/27/2020   PFIZER(Purple Top)SARS-COV-2 Vaccination 04/20/2019, 05/13/2019, 12/21/2019   Pfizer Covid-19 Vaccine Bivalent Booster 10yr & up 12/20/2020   Pneumococcal Conjugate-13 01/18/2015   Pneumococcal Polysaccharide-23 07/12/2010, 04/21/2017   Td 11/16/2018   Tdap 09/15/2008   Zoster Recombinat (Shingrix) 01/16/2018, 03/23/2018    Conditions to  be addressed/monitored: CAD, HTN, HLD, Hypertriglyceridemia, DMII, Anxiety, and obesity  Care Plan : General  Pharmacy (Adult)  Updates made by [name], RPH-CPP since 02/18/2021 12:00 AM     Problem: CHL AMB "PATIENT-SPECIFIC PROBLEM"      Long-Range Goal: Pharmacy Care plan for chronic conditions and medication management   Start Date: 09/27/2020  Priority: High  Note:   Current Barriers:  Unable to achieve control of hyperlipidemia  No current therapy for osteopenia with high estimate Frax score.   Pharmacist Clinical Goal(s):  Over the next 90 days, patient will achieve adherence to monitoring guidelines and medication adherence to achieve therapeutic efficacy achieve control of hyperlipidemia as evidenced by LDL <70 maintain control of HTN and type 2 DM as evidenced by BP <140/90 and A1c < 7.2%  through collaboration with PharmD and provider.  Get DEXA and evaluate for either Reclast or Prolia therapy   Interventions: 1:1 collaboration with Copland, Gay Filler, MD regarding development and update of comprehensive plan of care as evidenced by provider attestation and co-signature Inter-disciplinary care team collaboration (see longitudinal plan of care) Comprehensive medication review performed; medication list updated in electronic medical record  Hypertension Controlled; BP goal <130/80 Current regimen:  Telmisartan 79m daily Metoprolol succinate 249m1/2 tab daily Amlodipine 1010maily  Home blood pressure readings - has not checked recently Denies dizziness or symptoms of hypotension Interventions: Requested patient to check blood pressure 3 to 4 times per week and record. Provide at future appointments Ensure daily salt intake < 2300 mg/day Continue to take above hypertensive medications  Hyperlipidemia / CAD:  Not at goals;  LDL goal < 70 and Tg <150 Managed by Dr HilDebara Pickettcardio Current regimen:  Simvastatin 10m77mily  Vascepa 1g - take 1 capsule twice a day Ezetimibe 10mg57mly  Fenofibrate 145mg 89my (started 08/18/2020) Clopidogrel 75mg d20m  Past  medications: rosuvastatin (myalgias); Niacin (flushing and skin rash) Denied recent chest pain; No current myalgias.  Interventions: Review refill records form 2022. There are some gaps in refills for Vascepa / icosapent ethyl capsules. Discussed with patient and she reports she is only taking 1 capsule twice a day due to increase in reflux when she takes full dose. Per patient Dr Hilty iDebara Pickettre.  Reminded patient she is due to refill simvastatin. Discussed importance of adherence.  Continue to follow up with Dr Hilty. Debara Pickettbetes Not at goal with recent increase in A1c;  A1c goal <7% Current regimen:  Metformin ER 500mg da1mPatient has lost about 24 lbs in the last 12 months. Following low sugar diet and limiting serving sizes.  Interventions: Discussed importance of diet and exercise Discussed possibly therapies for diabetes that might also help with weight loss - Ozempic, Trulicity, Jardinace.  Recommend check blood sugar once daily, document, and provide at future appointments Contact provider with any episodes of hypoglycemia Continue current regimen for diabetes. Reviewed 2022 refill history of metformin. Adherence was low at beginning of 2022 but has improved. Reminded patient that metformin refill will be due soon and discuss importance of taking daily to lower blood glucose. Will continue to follow adherence.   Depression/Anxiety Controlled Current treatment: Fluoxetine 20mg dai50mPatient mentions wanting to possibly stop fluoxetine in future. Interventions:  Discussed that if she decides to stop fluoxetine, she should taper dose and discuss with PCP.   Continue fluoxetine  Osteopenia: No current therapy Previously was taking alendronate but stopped due to cough / reflux.  Patient states cough  has improved.  Last DEXA was 02/02/2019 DualFemur Neck Right 02/02/2019       -2.0     DualFemur Neck Right 11/10/2015       -1.8      AP Spine L1-L2 02/02/2019  -1.3  AP Spine   L1-L2 08/18/201   -0.8     DualFemur Total Mean 02/02/2019     -0.7     DualFemur Total Mean 11/10/2015     -0.9     Low bone mass and a 10 year probability of a hip fracture is 3.3% and probability of a major osteoporosis-related fracture 16.5% Interventions:  Continue to hold alendronate. Will have DEXA rechecked 02/2021 Pending upcoming DEXA results - treatment options include Reclast, Prolia or Evenity Discussed fall prevention  Medication management Current pharmacy: [facility] Interventions Comprehensive medication review performed. Medication list updated to reflect any added or discontinued medications. Continue current medication management strategy Reviewed adherence / refill history. Discussed with patient.    Patient Goals/Self-Care Activities Over the next 90 days, patient will:  take medications as prescribed check blood pressure 3 to 4 times per week, document, and provide at future appointments Get DEXA / bone density rechecked 02/2021.     Follow Up Plan: Telephone follow up appointment with care management team member scheduled for:  February 2023; Will see PCP March 2023.        Medication Assistance: None required.  Patient affirms current coverage meets needs.  Patient's preferred pharmacy is:  [facility] [place] [address] Phone: [phone] Fax: [facility] [address], [place] [address] Phone: [phone] Fax: [facility] [address] [address] Phone: [phone] Fax: [phone]  Follow Up:  Feb 2023 - phone visit with clinical pharmacist; March 2023 follow up with PCP.   [name], PharmD Clinical Pharmacist [facility] [phone]

## 2021-02-14 ENCOUNTER — Telehealth: Payer: Self-pay

## 2021-02-14 NOTE — Telephone Encounter (Signed)
Clinical info submitted to insurance company. Waiting on summary of benefits to determine coverage. Will call patient to discuss once received.  

## 2021-02-14 NOTE — Telephone Encounter (Signed)
-----   Message from Rosita Kea, [place] sent at 02/13/2021  7:52 AM EST -----  ----- Message ----- From: Pearline Cables, MD Sent: 12/20/2020   8:35 AM EST To: Rosita Kea, CMA  Hi Charisma- this patient had to stop fosamax due to side effects and would like to start on prolia. Can you check on benefits for her?  Thank you!   JC

## 2021-02-18 NOTE — Patient Instructions (Addendum)
Visit Information [name], It was a pleasure speaking with you today.  I have attached a summary of our visit today and information about your health goals.   Our next appointment is by telephone on May 16, 2021 at 9:30am  Please call the care guide team at [phone] if you need to cancel or reschedule your appointment.    If you have any questions or concerns, please feel free to contact me either at the phone number below or with a MyChart message.   Keep up the good work!  [name], PharmD Clinical Pharmacist [facility] [phone] (direct line)  [phone] (main office number)   Hypertension BP Readings from Last 3 Encounters:  01/30/21 140/78  12/21/20 (!) 120/58  12/20/20 118/62   Pharmacist Clinical Goal(s): Over the next 90 days, patient will work with PharmD and providers to achieve BP goal <130/80 Current regimen:  Telmisartan 40mg  daily Metoprolol succinate 25mg  1/2 tab daily Interventions: Requested patient to check blood pressure 3 to 4  times per week and record Patient self care activities - Over the next 90 days, patient will: Check blood pressure 3 to 4 times per week, document, and provide at future appointments Ensure daily salt intake < 2300 mg/day  Hyperlipidemia Lipid Panel     Component Value Date/Time   CHOL 163 12/06/2020 0833   TRIG 112 12/06/2020 0833   HDL 41 12/06/2020 0833   CHOLHDL 4.0 12/06/2020 0833   CHOLHDL 6.3 (H) 12/20/2019 0945   VLDL 30.8 07/01/2019 0921   LDLCALC 102 (H) 12/06/2020 0833   LDLCALC  12/20/2019 0945     Comment:     . LDL cholesterol not calculated. Triglyceride levels greater than 400 mg/dL invalidate calculated LDL results. . Reference range: <100 . Desirable range <100 mg/dL for primary prevention;   <70 mg/dL for patients with CHD or diabetic patients  with > or = 2 CHD risk factors. 12/08/2020 LDL-C is now calculated using the Martin-Hopkins  calculation,  which is a validated novel method providing  better accuracy than the Friedewald equation in the  estimation of LDL-C.  12/22/2019 et al. Marland Kitchen. Horald Pollen): 2061-2068  (http://education.[facility].com/faq/FAQ164)    LDLDIRECT 82.0 11/16/2018 1010   LABVLDL 20 12/06/2020 0833    Pharmacist Clinical Goal(s): Over the next 90 days, patient will work with PharmD and providers to achieve LDL goal < 70 and Tg <150 Current regimen:  Simvastatin 40mg  daily (changed back from crestor on 02/24/20 per Cardio due to patient having muscle pain with crestor) Vascepa 1g - take 1 capsule twice a day Ezetimibe 10mg  daily  Fenofibrate 145mg  daily Interventions: Encouraged adherence to lipid lowering therapy.  Coordinated refill for simvastatin and fenofibrate Patient self care activities - Over the next 90 days, patient will: Continue lipid lowering medications listed above.  Continue to follow up with Dr 12/08/2020.   Diabetes Lab Results  Component Value Date/Time   HGBA1C 7.3 (H) 10/29/2020 02:26 AM   HGBA1C 7.2 (H) 06/19/2020 08:54 AM   Pharmacist Clinical Goal(s): Over the next 90 days, patient will work with PharmD and providers to achieve A1c goal <7% Current regimen:  Metformin ER 500mg  daily Interventions: Discussed importance of diet and exercise Discussed possibly therapies for diabetes that might also help with weight loss - Ozempic, Trulicity, Jardinace.  Patient self care activities - Over the next 90 days, patient will: Check blood sugar once daily, document, and provide at future appointments Contact provider with any episodes  of hypoglycemia Continue current regimen for diabetes.   Depression/Anxiety Current treatment: Fluoxetine 20mg  daily  Interventions:  Plan to discuss with Dr about starting to taper dose beginning in January 2023.   Continue fluoxetine at current dose for now.  Osteopenia: Previously was taking alendronate but stopped due to cough /  reflux.  Last DEXA was 02/02/2019 but due to have rechecked in 2023.  Interventions:  Continue to hold alendronate. Will have DEXA rechecked 02/2021 Discussed fall prevention  Medication management Pharmacist Clinical Goal(s): Over the next 180 days, patient will work with PharmD and providers to achieve optimal medication adherence Current pharmacy: 03/2021 Outpatient Pharmacy Interventions Comprehensive medication review performed. Continue current medication management strategy Patient self care activities - Over the next 180 days, patient will: Focus on medication adherence by filling and taking medications appropriately  Take medications as prescribed Report any questions or concerns to PharmD and/or provider(s)  Patient verbalizes understanding of instructions provided today and agrees to view in MyChart.

## 2021-02-20 ENCOUNTER — Encounter: Payer: Self-pay | Admitting: Family Medicine

## 2021-02-20 ENCOUNTER — Ambulatory Visit (HOSPITAL_BASED_OUTPATIENT_CLINIC_OR_DEPARTMENT_OTHER)
Admission: RE | Admit: 2021-02-20 | Discharge: 2021-02-20 | Disposition: A | Discharge status: Home / Self Care | Payer: PPO | Source: Ambulatory Visit | Attending: Family Medicine | Admitting: Family Medicine

## 2021-02-20 ENCOUNTER — Other Ambulatory Visit: Payer: Self-pay

## 2021-02-20 DIAGNOSIS — M8580 Other specified disorders of bone density and structure, unspecified site: Secondary | ICD-10-CM | POA: Diagnosis not present

## 2021-02-20 DIAGNOSIS — E2839 Other primary ovarian failure: Secondary | ICD-10-CM | POA: Diagnosis not present

## 2021-02-20 DIAGNOSIS — Z13820 Encounter for screening for osteoporosis: Secondary | ICD-10-CM | POA: Insufficient documentation

## 2021-02-21 DIAGNOSIS — I10 Essential (primary) hypertension: Secondary | ICD-10-CM

## 2021-02-21 DIAGNOSIS — E1159 Type 2 diabetes mellitus with other circulatory complications: Secondary | ICD-10-CM

## 2021-02-21 DIAGNOSIS — Z9861 Coronary angioplasty status: Secondary | ICD-10-CM | POA: Diagnosis not present

## 2021-02-21 DIAGNOSIS — E785 Hyperlipidemia, unspecified: Secondary | ICD-10-CM

## 2021-02-21 DIAGNOSIS — Z7984 Long term (current) use of oral hypoglycemic drugs: Secondary | ICD-10-CM | POA: Diagnosis not present

## 2021-02-21 DIAGNOSIS — I2510 Atherosclerotic heart disease of native coronary artery without angina pectoris: Secondary | ICD-10-CM

## 2021-02-28 ENCOUNTER — Encounter: Payer: Self-pay | Admitting: Internal Medicine

## 2021-02-28 ENCOUNTER — Other Ambulatory Visit (HOSPITAL_COMMUNITY): Payer: Self-pay

## 2021-02-28 ENCOUNTER — Ambulatory Visit: Payer: PPO | Admitting: Internal Medicine

## 2021-02-28 ENCOUNTER — Other Ambulatory Visit: Payer: Self-pay

## 2021-02-28 DIAGNOSIS — R0602 Shortness of breath: Secondary | ICD-10-CM | POA: Diagnosis not present

## 2021-02-28 DIAGNOSIS — R058 Other specified cough: Secondary | ICD-10-CM | POA: Diagnosis not present

## 2021-02-28 MED FILL — Clopidogrel Bisulfate Tab 75 MG (Base Equiv): ORAL | 90 days supply | Qty: 90 | Fill #2 | Status: AC

## 2021-02-28 NOTE — Progress Notes (Signed)
[name], female    DOB: [birth year]    MRN: [id number]   Brief patient profile:  45 yowf never smoker/ chronic ear infections  referred to pulmonary clinic 11/09/2020 by Dr Patsy Lager for sob but has had a cough ever since Flu-like  Feb 2020 sick x 3-4 weeks  Started fosamax 05/28/19     History of Present Illness  11/09/2020  Pulmonary/ 1st office eval/[name]  Chief Complaint  Patient presents with   Consult    SOB for 5 weeks with exertion, cough and wheezing at night, tightness in chest at times  Dyspnea:  5 weeks prior to OV  noted getting back to house from [employer] with cough Cough: after supper after stirs in am / note green nasal drainage one day prior to OV   Sleep: sleep on cpap/ perfectly flat  SABA use: abuterol Cp started oct 2021 just the to the Left of sternum Rec Stop the fosamax and calcium carbonate Aciphex 20 mg Take 30-60 min before first meal of the day and pepcid ac 20 mg after supper GERD diet reviewed  bed blocks rec    12/21/2020  f/u ov/[name] re: cough/wheeze/ cp  maint on gerd rx   Chief Complaint  Patient presents with   Follow-up    Cough is much improved. She has some PND and still has occ cough with clear sputum. She has not used her albuterol inhaler in the past few wks.    Dyspnea:  better outside walking  Cough: 1st hour some cough then better during the day Sleeping: cpap  SABA use: none  02: none  Covid status:   vax x 5   Rec Strongly recommend the bed blocks x 6-8 inches  For drainage / throat tickle try take CHLORPHENIRAMINE  4 mg    12/21/2020 >  Eos 0.4 /  IgE  144 > rec allergy eval > neg per Dr Selena Batten   02/28/2021  f/u ov/[name] re: cough/ wheeze since Feb 2020 maint on h1 in am   Chief Complaint  Patient presents with   Follow-up    Pt states feeling better  Dyspnea:  walking regularly now  Cough: p stirring has morning mucus takes a couple of hours to clear clear  Sleeping: fine cpap bed is flat  SABA use: never  02: none  Covid  status:   vax x 5    No obvious day to day or daytime variability or assoc excess/ purulent sputum or mucus plugs or hemoptysis or cp or chest tightness, subjective wheeze or  hb symptoms.   Sleeping  without nocturnal  or early am exacerbation  of respiratory  c/o's or need for noct saba. Also denies any obvious fluctuation of symptoms with weather or environmental changes or other aggravating or alleviating factors except as outlined above   No unusual exposure hx or h/o childhood pna/ asthma or knowledge of premature birth.  Current Allergies, Complete Past Medical History, Past Surgical History, Family History, and Social History were reviewed in [employer] record.  ROS  The following are not active complaints unless bolded Hoarseness, sore throat, dysphagia, dental problems, itching, sneezing,  nasal congestion or discharge of excess mucus or purulent secretions, ear ache,   fever, chills, sweats, unintended wt loss or wt gain, classically pleuritic or exertional cp,  orthopnea pnd or arm/hand swelling  or leg swelling, presyncope, palpitations, abdominal pain, anorexia, nausea, vomiting, diarrhea  or change in bowel habits or change in  bladder habits, change in stools or change in urine, dysuria, hematuria,  rash, arthralgias, visual complaints, headache, numbness, weakness or ataxia or problems with walking or coordination,  change in mood or  memory.        Current Meds  Medication Sig   albuterol (VENTOLIN HFA) 108 (90 Base) MCG/ACT inhaler Inhale 2 puffs into the lungs every 6 (six) hours as needed for wheezing or shortness of breath.   amLODipine (NORVASC) 10 MG tablet Take 1 tablet (10 mg total) by mouth daily.   aspirin EC 81 MG EC tablet Take 1 tablet (81 mg total) by mouth daily.   chlorpheniramine (CHLOR-TRIMETON) 4 MG tablet Take 4 mg by mouth daily.   cholecalciferol (VITAMIN D) 1000 UNITS tablet Take 1,000 Units by mouth daily.   clopidogrel (PLAVIX)  75 MG tablet TAKE 1 TABLET BY MOUTH DAILY.   cyclobenzaprine (FLEXERIL) 10 MG tablet TAKE 1 TABLET BY MOUTH TWICE DAILY AS NEEDED FOR MUSCLE SPASMS   diazepam (VALIUM) 2 MG tablet Take 1 tablet (2 mg total) by mouth every 8 (eight) hours as needed for muscle spasms.   ezetimibe (ZETIA) 10 MG tablet TAKE 1 TABLET BY MOUTH ONCE A DAY   famotidine (PEPCID) 10 MG tablet Take 10 mg by mouth at bedtime.   fenofibrate (TRICOR) 145 MG tablet Take 1 tablet (145 mg total) by mouth daily.   FLUoxetine (PROZAC) 20 MG capsule TAKE 1 CAPSULE BY MOUTH ONCE DAILY   fluticasone (FLONASE) 50 MCG/ACT nasal spray Place 1 spray into both nostrils daily as needed for allergies or rhinitis.         icosapent Ethyl (VASCEPA) 1 g capsule TAKE 2 CAPSULES BY MOUTH TWICE DAILY (Patient taking differently: Take 1 g by mouth 2 (two) times daily.)   isosorbide mononitrate (IMDUR) 30 MG 24 hr tablet Take 1 tablet (30 mg total) by mouth daily.   metFORMIN (GLUCOPHAGE-XR) 500 MG 24 hr tablet Take 1 tablet (500 mg total) by mouth daily with breakfast.   metoprolol succinate (TOPROL-XL) 25 MG 24 hr tablet TAKE 1/2 OF A TABLET BY MOUTH DAILY.   Multiple Vitamin (MULTIVITAMIN WITH MINERALS) TABS tablet Take 1 tablet by mouth daily.   nitroGLYCERIN (NITROSTAT) 0.4 MG SL tablet Place 1 tablet under the tongue every 5 minutes as needed for chest pain.   RABEprazole (ACIPHEX) 20 MG tablet Take 20 mg by mouth daily. 30 minutes prior to food   simvastatin (ZOCOR) 40 MG tablet Take 1 tablet (40 mg total) by mouth daily.        telmisartan (MICARDIS) 40 MG tablet TAKE 1 TABLET BY MOUTH EVERY MORNING * NEEDS OFFICE VISIT     Past Medical History:  Diagnosis Date   Anemia    Arthritis    Bronchitis    hx of    CAD (coronary artery disease)    a. s/p DES to LAD in 2010 with patent stent by cath in 2014 b. low-risk NST in 11/2016   Cataracts, bilateral    Diabetes mellitus without complication ([place])    Family history of adverse  reaction to anesthesia    pts mother had difficulty awakening    GERD (gastroesophageal reflux disease)    Hyperlipidemia LDL goal < 70    Hypertension    Lumbar back pain    Numbness    left leg and foot    Plantar fascia rupture    Left Foot   Sleep apnea    uses CPAP  Objective:    wts  02/28/2021      201   12/21/20 204 lb (92.5 kg)  12/20/20 204 lb 12.8 oz (92.9 kg)  12/06/20 202 lb (91.6 kg)    Vital signs reviewed  02/28/2021  - Note at rest 02 sats  99% on RA   General appearance:    obese pleasant amb wf nad/ slt nasal tone to voice    HEENT : pt wearing mask not removed for exam due to covid -19 concerns.    NECK :  without JVD/Nodes/TM/ nl carotid upstrokes bilaterally   LUNGS: no acc muscle use,  Nl contour chest which is clear to A and P bilaterally without cough on insp or exp maneuvers   CV:  RRR  no s3 or murmur or increase in P2, and no edema   ABD:  soft and nontender with nl inspiratory excursion in the supine position. No bruits or organomegaly appreciated, bowel sounds nl  MS:  Nl gait/ ext warm without deformities, calf tenderness, cyanosis or clubbing No obvious joint restrictions   SKIN: warm and dry without lesions    NEURO:  alert, approp, nl sensorium with  no motor or cerebellar deficits apparent.                 Assessment

## 2021-02-28 NOTE — Patient Instructions (Addendum)
Ok to try Zyrtec 10 mg in pm to see if helps your daytime cough/ congestion/ extra mucus   Ok to use chlorpheniramine on an as needed basis up to every 4 hours but it will make you a lot sleepier than zyrtec  Ok to leave off inhalers and see me as needed

## 2021-02-28 NOTE — Assessment & Plan Note (Signed)
Onset Feb 2020  p viral uri -  Trial off fosfamax /ca carb and on max gerd rx 11/09/2020 >>> improved but not completely resolved 12/21/2020  - . Allergy profile 12/21/2020 >  Eos 0.4 /  IgE  144 > 12/25/2020  - 01/30/21  Dr Selena Batten eval and neg skin and serum and no airflow obst on spirometry - 12/21/2020 re-inforced bed blocks and added 1st gen H1 blockers per guidelines   No evidence of significant asthma or allergy at this point and most of her symptoms can be controlled with otc antihistamines so pulmonary f/u can be prn          Each maintenance medication was reviewed in detail including emphasizing most importantly the difference between maintenance and prns and under what circumstances the prns are to be triggered using an action plan format where appropriate.  Total time for H and P, chart review, counseling, reviewing nasal  device(s) and generating customized AVS unique to this summary final  office visit / same day charting = 25 min

## 2021-03-01 DIAGNOSIS — H5212 Myopia, left eye: Secondary | ICD-10-CM | POA: Diagnosis not present

## 2021-03-01 DIAGNOSIS — H5201 Hypermetropia, right eye: Secondary | ICD-10-CM | POA: Diagnosis not present

## 2021-03-01 DIAGNOSIS — E119 Type 2 diabetes mellitus without complications: Secondary | ICD-10-CM | POA: Diagnosis not present

## 2021-03-01 DIAGNOSIS — H52202 Unspecified astigmatism, left eye: Secondary | ICD-10-CM | POA: Diagnosis not present

## 2021-03-01 DIAGNOSIS — Z7984 Long term (current) use of oral hypoglycemic drugs: Secondary | ICD-10-CM | POA: Diagnosis not present

## 2021-03-01 DIAGNOSIS — H524 Presbyopia: Secondary | ICD-10-CM | POA: Diagnosis not present

## 2021-03-01 DIAGNOSIS — Z961 Presence of intraocular lens: Secondary | ICD-10-CM | POA: Diagnosis not present

## 2021-03-09 NOTE — Telephone Encounter (Signed)
Message sent to Mychart to schedule.

## 2021-03-09 NOTE — Telephone Encounter (Signed)
Pt will owe $295.00 at time of injection- no PA needed. Okay to schedule.

## 2021-03-12 ENCOUNTER — Other Ambulatory Visit (HOSPITAL_COMMUNITY): Payer: Self-pay

## 2021-03-12 DIAGNOSIS — G4733 Obstructive sleep apnea (adult) (pediatric): Secondary | ICD-10-CM | POA: Diagnosis not present

## 2021-03-12 MED FILL — Ezetimibe Tab 10 MG: ORAL | 90 days supply | Qty: 90 | Fill #2 | Status: AC

## 2021-03-14 ENCOUNTER — Other Ambulatory Visit (HOSPITAL_COMMUNITY): Payer: Self-pay

## 2021-03-21 ENCOUNTER — Other Ambulatory Visit (HOSPITAL_COMMUNITY): Payer: Self-pay

## 2021-03-21 ENCOUNTER — Other Ambulatory Visit: Payer: Self-pay | Admitting: Internal Medicine

## 2021-03-21 MED ORDER — EZETIMIBE 10 MG PO TABS
ORAL_TABLET | Freq: Every day | ORAL | 3 refills | Status: DC
  Filled 2021-03-21 – 2021-06-18 (×2): qty 90, 90d supply, fill #0
  Filled 2021-09-11: qty 90, 90d supply, fill #1
  Filled 2021-12-10: qty 90, 90d supply, fill #2
  Filled 2022-03-10: qty 90, 90d supply, fill #3

## 2021-03-26 NOTE — Progress Notes (Deleted)
Follow Up Note  RE: [name] MRN: [id number] DOB: June 25, 1950 Date of Office Visit: 03/27/2021  Referring provider: Darreld Mclean, MD Primary care provider: Darreld Mclean, MD  Chief Complaint: No chief complaint on file.  History of Present Illness: I had the pleasure of seeing [name] for a follow up visit at the [facility] [place] [facility] [place] on 03/26/2021. She is a 71 y.o. female, who is being followed for shortness of breath, rhinitis and heartburn. Her previous allergy office visit was on 01/30/2021 with Dr. Maudie Mercury. Today is a regular follow up visit.  Blood work was negative to indoor and outdoor allergens -no allergic triggers to her symptoms. Continue with Flovent  Shortness of breath Shortness of breath, chest tightness, coughing and wheezing for the last 6 months.  Worse with exertion.  History of cardiac stents and had cardiology work-up (echo and cath) which was unremarkable.  Saw pulmonology as well.  Normal chest x-ray. Bloodwork showed eos 400, IgE 144 and pulmonology concerned about allergic triggers. Today's skin testing showed: Negative to indoor/outdoor allergens but positive control was negative questioning the validity of the results. Patient has not been on antihistamines for more than 3 days.  Get bloodwork for environmental allergy panel. Today's spirometry showed: restrictive disease with 11% improvement in FEV1 post bronchodilator treatment. Clinically feeling unchanged.  Daily controller medication(s): start Flovent 149mcg 2 puffs twice a day with spacer and rinse mouth afterwards. May use albuterol rescue inhaler 2 puffs every 4 to 6 hours as needed for shortness of breath, chest tightness, coughing, and wheezing. May use albuterol rescue inhaler 2 puffs 5 to 15 minutes prior to strenuous physical activities. Monitor frequency of use.  Get spirometry at next visit.   Chronic rhinitis Mainly has symptoms when working outdoors. Takes OTC antihistamines  with good benefit. No prior allergy testing. Today's skin testing showed: Negative to indoor/outdoor allergens but positive control was negative questioning the validity of the results. Patient has not been on antihistamines for more than 3 days.  Get bloodwork for environmental allergy panel.   Heartburn See handout for lifestyle and dietary modifications. Continue Aciphex daily. Nothing to eat or drink for 30 minutes afterwards  Assessment and Plan: [name] is a 71 y.o. female with: No problem-specific Assessment & Plan notes found for this encounter.  No follow-ups on file.  No orders of the defined types were placed in this encounter.  Lab Orders  No laboratory test(s) ordered today    Diagnostics: Spirometry:  Tracings reviewed. Her effort: {Blank single:19197::"Good reproducible efforts.","It was hard to get consistent efforts and there is a question as to whether this reflects a maximal maneuver.","Poor effort, data can not be interpreted."} FVC: ***L FEV1: ***L, ***% predicted FEV1/FVC ratio: ***% Interpretation: {Blank single:19197::"Spirometry consistent with mild obstructive disease","Spirometry consistent with moderate obstructive disease","Spirometry consistent with severe obstructive disease","Spirometry consistent with possible restrictive disease","Spirometry consistent with mixed obstructive and restrictive disease","Spirometry uninterpretable due to technique","Spirometry consistent with normal pattern","No overt abnormalities noted given today's efforts"}.  Please see scanned spirometry results for details.  Skin Testing: {Blank single:19197::"Select foods","Environmental allergy panel","Environmental allergy panel and select foods","Food allergy panel","None","Deferred due to recent antihistamines use"}. *** Results discussed with patient/family.   Medication List:  Current Outpatient Medications  Medication Sig Dispense Refill   amLODipine (NORVASC) 10 MG  tablet Take 1 tablet (10 mg total) by mouth daily. 30 tablet 6   aspirin EC 81 MG EC tablet Take 1 tablet (81 mg total) by mouth  daily. 30 tablet 1   chlorpheniramine (CHLOR-TRIMETON) 4 MG tablet Take 4 mg by mouth daily.     cholecalciferol (VITAMIN D) 1000 UNITS tablet Take 1,000 Units by mouth daily.     clopidogrel (PLAVIX) 75 MG tablet TAKE 1 TABLET BY MOUTH DAILY. 90 tablet 3   cyclobenzaprine (FLEXERIL) 10 MG tablet TAKE 1 TABLET BY MOUTH TWICE DAILY AS NEEDED FOR MUSCLE SPASMS 30 tablet 2   diazepam (VALIUM) 2 MG tablet Take 1 tablet (2 mg total) by mouth every 8 (eight) hours as needed for muscle spasms. 30 tablet 2   ezetimibe (ZETIA) 10 MG tablet TAKE 1 TABLET BY MOUTH ONCE A DAY 90 tablet 3   famotidine (PEPCID) 10 MG tablet Take 10 mg by mouth at bedtime.     fenofibrate (TRICOR) 145 MG tablet Take 1 tablet (145 mg total) by mouth daily. 90 tablet 3   FLUoxetine (PROZAC) 20 MG capsule TAKE 1 CAPSULE BY MOUTH ONCE DAILY 90 capsule 3   fluticasone (FLONASE) 50 MCG/ACT nasal spray Place 1 spray into both nostrils daily as needed for allergies or rhinitis. 16 g 11   icosapent Ethyl (VASCEPA) 1 g capsule TAKE 2 CAPSULES BY MOUTH TWICE DAILY (Patient taking differently: Take 1 g by mouth 2 (two) times daily.) 120 capsule 11   isosorbide mononitrate (IMDUR) 30 MG 24 hr tablet Take 1 tablet (30 mg total) by mouth daily. 90 tablet 1   metFORMIN (GLUCOPHAGE-XR) 500 MG 24 hr tablet Take 1 tablet (500 mg total) by mouth daily with breakfast. 90 tablet 1   metoprolol succinate (TOPROL-XL) 25 MG 24 hr tablet TAKE 1/2 OF A TABLET BY MOUTH DAILY. 45 tablet 1   Multiple Vitamin (MULTIVITAMIN WITH MINERALS) TABS tablet Take 1 tablet by mouth daily.     nitroGLYCERIN (NITROSTAT) 0.4 MG SL tablet Place 1 tablet under the tongue every 5 minutes as needed for chest pain. 25 tablet 2   nystatin (MYCOSTATIN/NYSTOP) powder Apply 1 application topically 3 (three) times daily. (Patient not taking: Reported on  02/28/2021) 30 g 0   RABEprazole (ACIPHEX) 20 MG tablet Take 20 mg by mouth daily. 30 minutes prior to food     simvastatin (ZOCOR) 40 MG tablet Take 1 tablet (40 mg total) by mouth daily. 90 tablet 1   telmisartan (MICARDIS) 40 MG tablet TAKE 1 TABLET BY MOUTH EVERY MORNING * NEEDS OFFICE VISIT 90 tablet 2   Current Facility-Administered Medications  Medication Dose Route Frequency Provider Last Rate Last Admin   sodium chloride flush (NS) 0.9 % injection 3 mL  3 mL Intravenous Q12H Cleaver, Jossie Ng, NP       Allergies: Allergies  Allergen Reactions   Crestor [Rosuvastatin] Other (See Comments)    myalgia   Niacin And Related Other (See Comments)    [place] and skin flushed, mouth tingling   I reviewed her past medical history, social history, family history, and environmental history and no significant changes have been reported from her previous visit.  Review of Systems  Constitutional:  Negative for appetite change, chills, fever and unexpected weight change.  HENT:  Negative for congestion and rhinorrhea.   Eyes:  Negative for itching.  Respiratory:  Positive for cough, chest tightness, shortness of breath and wheezing.   Cardiovascular:  Negative for chest pain.  Gastrointestinal:  Negative for abdominal pain.  Genitourinary:  Negative for difficulty urinating.  Skin:  Negative for rash.  Neurological:  Negative for headaches.   Objective: LMP  (LMP  Unknown)  There is no height or weight on file to calculate BMI. Physical Exam Vitals and nursing note reviewed.  Constitutional:      Appearance: Normal appearance. She is well-developed.  HENT:     Head: Normocephalic and atraumatic.     Right Ear: External ear normal.     Left Ear: External ear normal.     Ears:     Comments: Scarring on TM b/l.    Nose: Nose normal.     Mouth/Throat:     Mouth: Mucous membranes are moist.     Pharynx: Oropharynx is clear.  Eyes:     Conjunctiva/sclera: Conjunctivae normal.   Cardiovascular:     Rate and Rhythm: Normal rate and regular rhythm.     Heart sounds: Normal heart sounds. No murmur heard.   No friction rub. No gallop.  Pulmonary:     Effort: Pulmonary effort is normal.     Breath sounds: Normal breath sounds. No wheezing, rhonchi or rales.  Musculoskeletal:     Cervical back: Neck supple.  Skin:    General: Skin is warm.     Findings: No rash.  Neurological:     Mental Status: She is alert and oriented to person, place, and time.  Psychiatric:        Behavior: Behavior normal.   Previous notes and tests were reviewed. The plan was reviewed with the patient/family, and all questions/concerned were addressed.  It was my pleasure to see [name] today and participate in her care. Please feel free to contact me with any questions or concerns.  Sincerely,  Rexene Alberts, DO Allergy & Immunology  [facility] office: [place] office: [phone]

## 2021-03-27 ENCOUNTER — Ambulatory Visit: Payer: PPO | Admitting: Allergy

## 2021-03-27 DIAGNOSIS — R0602 Shortness of breath: Secondary | ICD-10-CM

## 2021-03-27 DIAGNOSIS — J310 Chronic rhinitis: Secondary | ICD-10-CM

## 2021-03-27 DIAGNOSIS — R12 Heartburn: Secondary | ICD-10-CM

## 2021-03-29 ENCOUNTER — Other Ambulatory Visit: Payer: Self-pay | Admitting: Family Medicine

## 2021-03-29 DIAGNOSIS — Z1231 Encounter for screening mammogram for malignant neoplasm of breast: Secondary | ICD-10-CM

## 2021-04-12 ENCOUNTER — Ambulatory Visit: Payer: PPO

## 2021-04-16 ENCOUNTER — Other Ambulatory Visit: Payer: Self-pay | Admitting: Internal Medicine

## 2021-04-16 ENCOUNTER — Other Ambulatory Visit (HOSPITAL_COMMUNITY): Payer: Self-pay

## 2021-04-16 ENCOUNTER — Other Ambulatory Visit: Payer: Self-pay | Admitting: Family Medicine

## 2021-04-16 DIAGNOSIS — E785 Hyperlipidemia, unspecified: Secondary | ICD-10-CM

## 2021-04-16 MED ORDER — ICOSAPENT ETHYL 1 G PO CAPS
ORAL_CAPSULE | Freq: Two times a day (BID) | ORAL | 0 refills | Status: DC
  Filled 2021-04-16: qty 120, 30d supply, fill #0

## 2021-04-17 ENCOUNTER — Other Ambulatory Visit (HOSPITAL_COMMUNITY): Payer: Self-pay

## 2021-04-17 MED ORDER — CLOPIDOGREL BISULFATE 75 MG PO TABS
ORAL_TABLET | Freq: Every day | ORAL | 3 refills | Status: DC
  Filled 2021-04-17: qty 90, fill #0
  Filled 2021-06-27: qty 90, 90d supply, fill #0
  Filled 2021-09-20: qty 90, 90d supply, fill #1
  Filled 2021-12-18: qty 90, 90d supply, fill #2
  Filled 2022-03-27: qty 90, 90d supply, fill #3

## 2021-04-17 MED ORDER — TELMISARTAN 40 MG PO TABS
ORAL_TABLET | ORAL | 2 refills | Status: DC
  Filled 2021-04-17: qty 90, fill #0
  Filled 2021-07-03: qty 90, 90d supply, fill #0
  Filled 2021-10-18: qty 90, 90d supply, fill #1
  Filled 2022-01-12: qty 90, 90d supply, fill #2

## 2021-04-18 NOTE — Progress Notes (Addendum)
[facility] [address], [address] [place], [place] [id number] [phone] [phone]  Date:  04/23/2021   Name:  [name]   DOB:  1950/04/25   MRN:  [id number]  PCP:  Pearline [place], [place] Virrueta C, MD    Chief Complaint: 4 month follow up (Concerns/ questions: 1. Needs Prolia today. 2. Pt still gets a little shot of breath. 3. Pt says her last meal was around 9:30AM/Had flu shot on 12/15/20/Eye exam: December Dr Junie SpencerMark Shapero )   History of Present Illness:  [name] is a 71 y.o. very pleasant female patient who presents with the following:  Patient seen today for Prolia injection and follow-up History of hypertension, CAD, diabetes, sleep apnea, obesity, osteopenia with elevated fracture risk on fosamax Status post PCI 2010 and 2014 Last summer she was admitted with chest pain and had a cath, which was negative She had been on Fosamax-stopped by pulmonology, Dr. Sherene SiresWert in case it was worsening her reflux and shortness of breath She does feel like her GERD sx are better- she in aciphex still She is seeing Wert just PRN now  For shot of Prolia today  Eye exam- UTD  Can update foot exam Mammogram scheduled Lab Results  Component Value Date   HGBA1C 7.3 (H) 10/29/2020   Can do A1c today if she would like Also noted minimal anemia on CBC in September, follow-up today  BP Readings from Last 3 Encounters:  04/23/21 124/80  02/28/21 136/74  01/30/21 140/78     Plavix Telmisartan 40 Toprol-XL 25 Amlodipine 10 Aspirin Valium as needed muscle spasm Zetia 10 Simvastatin Vascepa 2 g twice daily Tricor 145 Fluoxetine 20 Metformin XR 500 daily Patient Active Problem List   Diagnosis Date Noted   Shortness of breath 01/30/2021   Heartburn 01/30/2021   Chronic rhinitis 01/30/2021   Upper airway cough syndrome 11/09/2020   DOE (dyspnea on exertion) 11/09/2020   AKI (acute kidney injury) (HCC) 10/30/2020   Obstructive sleep apnea  treated with continuous positive airway pressure (CPAP) 05/07/2018   Chest pain 01/31/2018   Morbid obesity (HCC) 11/03/2017   Coronary artery disease involving native coronary artery of native heart with unstable angina pectoris (HCC) 11/03/2017   Insomnia 11/03/2017   Inadequate sleep hygiene 11/03/2017   Anxiety 07/21/2017   Dizziness 10/02/2016   Right patella fracture 08/29/2016   Acquired foot deformity, left 07/18/2016   Osteopenia 11/10/2015   HNP (herniated nucleus pulposus), lumbar 10/12/2014   Spinal stenosis at L4-L5 level 10/12/2014   Iron deficiency anemia 07/29/2014   DM (diabetes mellitus) with complications (HCC) 07/29/2014   Environmental and seasonal allergies 04/28/2014   Spinal stenosis, lumbar region, with neurogenic claudication 01/06/2013   Obesity, morbid, BMI 40.0-49.9 (HCC) 10/20/2012   Chest pain with moderate risk of acute coronary syndrome 10/02/2012   CAD S/P percutaneous coronary angioplasty    Hyperlipidemia with target LDL less than 70    Essential hypertension 04/16/2012    Past Medical History:  Diagnosis Date   Anemia    Arthritis    Bronchitis    hx of    CAD (coronary artery disease)    a. s/p DES to LAD in 2010 with patent stent by cath in 2014 b. low-risk NST in 11/2016   Cataracts, bilateral    Diabetes mellitus without complication (HCC)    Family history of adverse reaction to anesthesia    pts mother had difficulty awakening  GERD (gastroesophageal reflux disease)    Hyperlipidemia LDL goal < 70    Hypertension    Lumbar back pain    Numbness    left leg and foot    Plantar fascia rupture    Left Foot   Sleep apnea    uses CPAP     Past Surgical History:  Procedure Laterality Date   ABDOMINAL HYSTERECTOMY     BACK SURGERY     BREAST CYST ASPIRATION  1995   CAROTID STENT  2009   pt denies    CORONARY ANGIOPLASTY WITH STENT PLACEMENT  2010and 10-02-2012   Stent DES, Xience to prox. LAD   DOPPLER ECHOCARDIOGRAPHY   08/01/2009   EF=>55%,LV normal   LEFT HEART CATH AND CORONARY ANGIOGRAPHY N/A 12/10/2019   Procedure: LEFT HEART CATH AND CORONARY ANGIOGRAPHY;  Surgeon: Lyn RecordsSmith, Henry W, MD;  Location: [facility];  Service: Cardiovascular;  Laterality: N/A;   LEFT HEART CATH AND CORONARY ANGIOGRAPHY N/A 10/30/2020   Procedure: LEFT HEART CATH AND CORONARY ANGIOGRAPHY;  Surgeon: Runell GessBerry, Jonathan J, MD;  Location: [facility];  Service: Cardiovascular;  Laterality: N/A;   LEFT HEART CATHETERIZATION WITH CORONARY ANGIOGRAM N/A 10/02/2012   Procedure: LEFT HEART CATHETERIZATION WITH CORONARY ANGIOGRAM;  Surgeon: Runell GessJonathan J Berry, MD;  [facility];  Service: Cardiovascular;  Laterality: N/A;   lower arterial duplex  06/20/10   abi's normal,rgt 0.98,lft 1.06;bilateral PVRs normal   LUMBAR LAMINECTOMY/DECOMPRESSION MICRODISCECTOMY Left 01/06/2013   Procedure: MICRO LUMBAR DECOMPRESSION L4-5 AND L5-S1;  Surgeon: Javier DockerJeffrey C Beane, MD;  Location: [facility];  Service: Orthopedics;  Laterality: Left;   LUMBAR LAMINECTOMY/DECOMPRESSION MICRODISCECTOMY Left 10/12/2014   Procedure: REVISION MICRO LUMBAR/DECOMPRESSION L4-5 LEFT ;  Surgeon: Jene EveryJeffrey Beane, MD;  Location: [facility];  Service: Orthopedics;  Laterality: Left;   NM MYOCAR PERF WALL MOTION  09/22/2008   lexiscan-EF 83%; glogal LV systolic fx is norm. ,evidence of mild ischemia basal anterior,midanterior and apical lateral region(s).    ORIF PATELLA Right 08/29/2016   Procedure: OPEN REDUCTION INTERNAL (ORIF) FIXATION RIGHT PATELLA;  Surgeon: Samson FredericSwinteck, Brian, MD;  Location: [facility];  Service: Orthopedics;  Laterality: Right;  Adductor Block   TUBAL LIGATION     TYMPANOPLASTY Bilateral    UVULOPALATOPHARYNGOPLASTY     pt denies     Social History   Tobacco Use   Smoking status: Never   Smokeless tobacco: Never  Vaping Use   Vaping Use: Never used  Substance Use Topics   Alcohol use: Yes    Comment: occasional, 1 a month wine   Drug use: No     Family History  Problem Relation Age of Onset   Coronary artery disease Mother    Rheum arthritis Mother    Dementia Mother    Heart attack Father    Hypertension Father    Hyperlipidemia Father    Other Father        MVA   Hypertension Brother    Cancer Brother    Heart disease Brother    Cancer Paternal Grandmother        stomach   Diabetes Paternal Grandfather    Breast cancer Neg Hx     Allergies  Allergen Reactions   Crestor [Rosuvastatin] Other (See Comments)    myalgia   Niacin And Related Other (See Comments)    [place]helps and skin flushed, mouth tingling    Medication list has been reviewed and updated.  Current Outpatient Medications on File Prior to  Visit  Medication Sig Dispense Refill   amLODipine (NORVASC) 10 MG tablet Take 1 tablet (10 mg total) by mouth daily. 30 tablet 6   aspirin EC 81 MG EC tablet Take 1 tablet (81 mg total) by mouth daily. 30 tablet 1   chlorpheniramine (CHLOR-TRIMETON) 4 MG tablet Take 4 mg by mouth daily.     cholecalciferol (VITAMIN D) 1000 UNITS tablet Take 1,000 Units by mouth daily.     clopidogrel (PLAVIX) 75 MG tablet TAKE 1 TABLET BY MOUTH DAILY. 90 tablet 3   cyclobenzaprine (FLEXERIL) 10 MG tablet TAKE 1 TABLET BY MOUTH TWICE DAILY AS NEEDED FOR MUSCLE SPASMS 30 tablet 2   diazepam (VALIUM) 2 MG tablet Take 1 tablet (2 mg total) by mouth every 8 (eight) hours as needed for muscle spasms. 30 tablet 2   ezetimibe (ZETIA) 10 MG tablet TAKE 1 TABLET BY MOUTH ONCE A DAY 90 tablet 3   famotidine (PEPCID) 10 MG tablet Take 10 mg by mouth at bedtime.     fenofibrate (TRICOR) 145 MG tablet Take 1 tablet (145 mg total) by mouth daily. 90 tablet 3   FLUoxetine (PROZAC) 20 MG capsule TAKE 1 CAPSULE BY MOUTH ONCE DAILY 90 capsule 3   fluticasone (FLONASE) 50 MCG/ACT nasal spray Place 1 spray into both nostrils daily as needed for allergies or rhinitis. 16 g 11   icosapent Ethyl (VASCEPA) 1 g capsule TAKE 2 CAPSULES BY MOUTH TWICE DAILY  120 capsule 0   isosorbide mononitrate (IMDUR) 30 MG 24 hr tablet Take 1 tablet (30 mg total) by mouth daily. 90 tablet 1   metFORMIN (GLUCOPHAGE-XR) 500 MG 24 hr tablet Take 1 tablet (500 mg total) by mouth daily with breakfast. 90 tablet 1   metoprolol succinate (TOPROL-XL) 25 MG 24 hr tablet TAKE 1/2 OF A TABLET BY MOUTH DAILY. 45 tablet 1   Multiple Vitamin (MULTIVITAMIN WITH MINERALS) TABS tablet Take 1 tablet by mouth daily.     nitroGLYCERIN (NITROSTAT) 0.4 MG SL tablet Place 1 tablet under the tongue every 5 minutes as needed for chest pain. 25 tablet 2   RABEprazole (ACIPHEX) 20 MG tablet Take 20 mg by mouth daily. 30 minutes prior to food     simvastatin (ZOCOR) 40 MG tablet Take 1 tablet (40 mg total) by mouth daily. 90 tablet 1   telmisartan (MICARDIS) 40 MG tablet TAKE 1 TABLET BY MOUTH EVERY MORNING * NEEDS OFFICE VISIT 90 tablet 2   [DISCONTINUED] rosuvastatin (CRESTOR) 20 MG tablet Take 1 tablet (20 mg total) by mouth daily. 30 tablet 3   Current Facility-Administered Medications on File Prior to Visit  Medication Dose Route Frequency Provider Last Rate Last Admin   sodium chloride flush (NS) 0.9 % injection 3 mL  3 mL Intravenous Q12H Ronney Asters, NP        Review of Systems:  As per HPI- otherwise negative.   Physical Examination: Vitals:   04/23/21 1357  BP: 124/80  Pulse: 70  Resp: 18  Temp: 97.9 F (36.6 C)  SpO2: 98%   Vitals:   04/23/21 1357  Weight: 201 lb 9.6 oz (91.4 kg)  Height: 5\' 2"  (1.575 m)   Body mass index is 36.87 kg/m. Ideal Body Weight: Weight in (lb) to have BMI = 25: 136.4  GEN: no acute distress. Obese, looks well  HEENT: Atraumatic, Normocephalic.  Ears and Nose: No external deformity. CV: RRR, No M/G/R. No JVD. No thrill. No extra heart sounds. PULM: CTA B,  no wheezes, crackles, rhonchi. No retractions. No resp. distress. No accessory muscle use. EXTR: No c/c/e PSYCH: Normally interactive. Conversant.  Foot exam today-  normal   Assessment and Plan: DM (diabetes mellitus) with complications (HCC) - Plan: Hemoglobin A1c  Essential hypertension - Plan: CBC, Comprehensive metabolic panel  Hyperlipidemia with target LDL less than 70  Osteopenia, unspecified location - Plan: denosumab (PROLIA) injection 60 mg  Heartburn - Plan: RABEprazole (ACIPHEX) 20 MG tablet [name] is seen today for follow-up.  Blood pressures are under good control, continue current blood pressure medication First dose of Prolia today Follow-up on diabetes and other labs as above Plan to follow-up again in 6 months  Signed Abbe Amsterdam, MD  Addendum 1/31, received her lab work as below Minimal anemia has been intermittently present, her hemoglobin is typically no higher than 12 Most recent colonoscopy in 2016, negative FOBT in 2019-we will send her repeat test Results for orders placed or performed in visit on 04/23/21  CBC  Result Value Ref Range   WBC 9.9 4.0 - 10.5 K/uL   RBC 4.61 3.87 - 5.11 Mil/uL   Platelets 328.0 150.0 - 400.0 K/uL   Hemoglobin 11.4 (L) 12.0 - 15.0 g/dL   HCT 16.6 06.3 - 01.6 %   MCV 79.9 78.0 - 100.0 fl   MCHC 31.0 30.0 - 36.0 g/dL   RDW 01.0 93.2 - 35.5 %  Comprehensive metabolic panel  Result Value Ref Range   Sodium 138 135 - 145 mEq/L   Potassium 4.0 3.5 - 5.1 mEq/L   Chloride 104 96 - 112 mEq/L   CO2 27 19 - 32 mEq/L   Glucose, Bld 110 (H) 70 - 99 mg/dL   BUN 22 6 - 23 mg/dL   Creatinine, Ser 7.32 0.40 - 1.20 mg/dL   Total Bilirubin 0.4 0.2 - 1.2 mg/dL   Alkaline Phosphatase 61 39 - 117 U/L   AST 16 0 - 37 U/L   ALT 15 0 - 35 U/L   Total Protein 6.6 6.0 - 8.3 g/dL   Albumin 4.3 3.5 - 5.2 g/dL   GFR 20.25 >42.70 mL/min   Calcium 9.9 8.4 - 10.5 mg/dL  Hemoglobin W2B  Result Value Ref Range   Hgb A1c MFr Bld 7.5 (H) 4.6 - 6.5 %   Message to patient

## 2021-04-18 NOTE — Patient Instructions (Signed)
It was great to see you again today, I will be in touch with your labs. Please do your eye exam if due Assuming all is well, please see me in about 6 months

## 2021-04-23 ENCOUNTER — Other Ambulatory Visit (HOSPITAL_COMMUNITY): Payer: Self-pay

## 2021-04-23 ENCOUNTER — Ambulatory Visit (INDEPENDENT_AMBULATORY_CARE_PROVIDER_SITE_OTHER): Payer: PPO | Admitting: Family Medicine

## 2021-04-23 VITALS — BP 124/80 | HR 70 | Temp 97.9°F | Resp 18 | Ht 62.0 in | Wt 201.6 lb

## 2021-04-23 DIAGNOSIS — E785 Hyperlipidemia, unspecified: Secondary | ICD-10-CM

## 2021-04-23 DIAGNOSIS — E118 Type 2 diabetes mellitus with unspecified complications: Secondary | ICD-10-CM | POA: Diagnosis not present

## 2021-04-23 DIAGNOSIS — R12 Heartburn: Secondary | ICD-10-CM | POA: Diagnosis not present

## 2021-04-23 DIAGNOSIS — I10 Essential (primary) hypertension: Secondary | ICD-10-CM

## 2021-04-23 DIAGNOSIS — D649 Anemia, unspecified: Secondary | ICD-10-CM

## 2021-04-23 DIAGNOSIS — M8580 Other specified disorders of bone density and structure, unspecified site: Secondary | ICD-10-CM | POA: Diagnosis not present

## 2021-04-23 MED ORDER — RABEPRAZOLE SODIUM 20 MG PO TBEC
20.0000 mg | DELAYED_RELEASE_TABLET | Freq: Every day | ORAL | 3 refills | Status: DC
  Filled 2021-04-23: qty 90, 90d supply, fill #0
  Filled 2021-07-23: qty 90, 90d supply, fill #1
  Filled 2021-11-06: qty 90, 90d supply, fill #2
  Filled 2022-01-17: qty 90, 90d supply, fill #3

## 2021-04-23 MED ORDER — DENOSUMAB 60 MG/ML ~~LOC~~ SOSY
60.0000 mg | PREFILLED_SYRINGE | Freq: Once | SUBCUTANEOUS | Status: AC
  Administered 2021-04-23: 60 mg via SUBCUTANEOUS

## 2021-04-24 ENCOUNTER — Ambulatory Visit
Admission: RE | Admit: 2021-04-24 | Discharge: 2021-04-24 | Disposition: A | Discharge status: Home / Self Care | Payer: PPO | Source: Ambulatory Visit | Attending: Family Medicine | Admitting: Family Medicine

## 2021-04-24 ENCOUNTER — Encounter: Payer: Self-pay | Admitting: Family Medicine

## 2021-04-24 DIAGNOSIS — Z1231 Encounter for screening mammogram for malignant neoplasm of breast: Secondary | ICD-10-CM | POA: Diagnosis not present

## 2021-04-24 LAB — CBC
HCT: 36.8 % (ref 36.0–46.0)
Hemoglobin: 11.4 g/dL — ABNORMAL LOW (ref 12.0–15.0)
MCHC: 31 g/dL (ref 30.0–36.0)
MCV: 79.9 fl (ref 78.0–100.0)
Platelets: 328 10*3/uL (ref 150.0–400.0)
RBC: 4.61 Mil/uL (ref 3.87–5.11)
RDW: 14.6 % (ref 11.5–15.5)
WBC: 9.9 10*3/uL (ref 4.0–10.5)

## 2021-04-24 LAB — COMPREHENSIVE METABOLIC PANEL
ALT: 15 U/L (ref 0–35)
AST: 16 U/L (ref 0–37)
Albumin: 4.3 g/dL (ref 3.5–5.2)
Alkaline Phosphatase: 61 U/L (ref 39–117)
BUN: 22 mg/dL (ref 6–23)
CO2: 27 mEq/L (ref 19–32)
Calcium: 9.9 mg/dL (ref 8.4–10.5)
Chloride: 104 mEq/L (ref 96–112)
Creatinine, Ser: 0.89 mg/dL (ref 0.40–1.20)
GFR: 65.19 mL/min (ref >60.00)
Glucose, Bld: 110 mg/dL — ABNORMAL HIGH (ref 70–99)
Potassium: 4 mEq/L (ref 3.5–5.1)
Sodium: 138 mEq/L (ref 135–145)
Total Bilirubin: 0.4 mg/dL (ref 0.2–1.2)
Total Protein: 6.6 g/dL (ref 6.0–8.3)

## 2021-04-24 LAB — HEMOGLOBIN A1C: Hgb A1c MFr Bld: 7.5 % — ABNORMAL HIGH (ref 4.6–6.5)

## 2021-04-24 NOTE — Addendum Note (Signed)
Addended by: Abbe Amsterdam C on: 04/24/2021 12:40 PM   Modules accepted: Orders

## 2021-05-01 ENCOUNTER — Other Ambulatory Visit (INDEPENDENT_AMBULATORY_CARE_PROVIDER_SITE_OTHER): Payer: PPO

## 2021-05-01 DIAGNOSIS — D649 Anemia, unspecified: Secondary | ICD-10-CM

## 2021-05-01 NOTE — Addendum Note (Signed)
Addended by: Mervin Kung A on: 05/01/2021 10:36 AM   Modules accepted: Orders

## 2021-05-02 ENCOUNTER — Encounter: Payer: Self-pay | Admitting: Family Medicine

## 2021-05-02 LAB — FECAL OCCULT BLOOD, IMMUNOCHEMICAL: Fecal Occult Bld: NEGATIVE

## 2021-05-07 ENCOUNTER — Other Ambulatory Visit (HOSPITAL_COMMUNITY): Payer: Self-pay

## 2021-05-07 ENCOUNTER — Other Ambulatory Visit (HOSPITAL_BASED_OUTPATIENT_CLINIC_OR_DEPARTMENT_OTHER): Payer: Self-pay | Admitting: Family

## 2021-05-07 DIAGNOSIS — I25118 Atherosclerotic heart disease of native coronary artery with other forms of angina pectoris: Secondary | ICD-10-CM

## 2021-05-08 ENCOUNTER — Other Ambulatory Visit (HOSPITAL_COMMUNITY): Payer: Self-pay

## 2021-05-08 MED ORDER — ISOSORBIDE MONONITRATE ER 30 MG PO TB24
30.0000 mg | ORAL_TABLET | Freq: Every day | ORAL | 2 refills | Status: DC
  Filled 2021-05-08: qty 90, 90d supply, fill #0
  Filled 2021-08-14: qty 90, 90d supply, fill #1
  Filled 2021-11-06: qty 90, 90d supply, fill #2

## 2021-05-08 NOTE — Telephone Encounter (Signed)
Rx(s) sent to pharmacy electronically.  

## 2021-05-09 ENCOUNTER — Other Ambulatory Visit (HOSPITAL_COMMUNITY): Payer: Self-pay

## 2021-05-16 ENCOUNTER — Telehealth: Payer: PPO

## 2021-05-17 ENCOUNTER — Other Ambulatory Visit (HOSPITAL_COMMUNITY): Payer: Self-pay

## 2021-05-17 ENCOUNTER — Ambulatory Visit (INDEPENDENT_AMBULATORY_CARE_PROVIDER_SITE_OTHER): Payer: PPO | Admitting: Pharmacist

## 2021-05-17 DIAGNOSIS — I2510 Atherosclerotic heart disease of native coronary artery without angina pectoris: Secondary | ICD-10-CM

## 2021-05-17 DIAGNOSIS — M8580 Other specified disorders of bone density and structure, unspecified site: Secondary | ICD-10-CM

## 2021-05-17 DIAGNOSIS — I10 Essential (primary) hypertension: Secondary | ICD-10-CM

## 2021-05-17 DIAGNOSIS — E785 Hyperlipidemia, unspecified: Secondary | ICD-10-CM

## 2021-05-17 MED ORDER — AMLODIPINE BESYLATE 10 MG PO TABS
10.0000 mg | ORAL_TABLET | Freq: Every day | ORAL | 1 refills | Status: DC
  Filled 2021-05-17 – 2021-07-03 (×2): qty 90, 90d supply, fill #0
  Filled 2021-10-26: qty 90, 90d supply, fill #1

## 2021-05-17 NOTE — Chronic Care Management (AMB) (Signed)
Chronic Care Management Pharmacy Note  05/22/2021 Name:  [name] MRN:  [id number] DOB:  12-13-1950  Summary:  Mixed hyperlipidemia - Not at goals. Managed by Dr Debara Pickett, cardiology. Assisted with applying for assistance through [facility] for cholesterol medications - decision pending.  Osteopenia with high fracture risk - started Prolia January 2023. Cost was $295. No current funding available for osteoporosis funds but will continue to monitor.  Discussed adherence and reviewed recent refill records. Updated prescription for amlodipine to get 90 per fill.   Subjective: [name] is an 71 y.o. year old female who is a primary patient of Copland, Gay Filler, MD.  The CCM team was consulted for assistance with disease management and care coordination needs.    Engaged with patient by telephone for follow up visit in response to provider referral for pharmacy case management and/or care coordination services.   Consent to Services:  The patient was given information about Chronic Care Management services, agreed to services, and gave verbal consent prior to initiation of services.  Please see initial visit note for detailed documentation.   Patient Care Team: Copland, Gay Filler, MD as PCP - General (Family Medicine) Debara Pickett Nadean Corwin, MD as PCP - Cardiology (Cardiology) [name], RPH-CPP (Pharmacist)  Recent office visits: 04/23/2021 - Fam Med (Dr Lorelei Pont) F/U diabetes and other chronic conditions 12/20/20-Dr Copland (PCP) 6 month f/u visit. Flu vaccine given. Bone density ordered. F/u in 6 months. 11/02/20-Dr Copland (PCP) Hospital discharge f/u. Labs ordered. Referred to Pulmonology.  Recent consult visits: 03/01/2021 - Opthmalology (Dr Gershon Crane- [facility]) Diabetic eye exam. No med changes. Follow up in 1 year 02/28/2021 - Pulm (Dr Melvyn Novas) Seen for SOB / cough. Suspected reflux related. No evidence of asthma or allergies per notes. Recommended OTC Zyrtec 10m as  needed and chlorpheniramine as needed. Stopped inhaler therapy. Follow up as needed.  01/30/2021 - Allergist (Dr YScherrie Bateman Seen for SOB / initial evaluation. Allergy test neg. Ordered panel for environmental allergies. Spirometry showed restrictive disease. Started Flovent 1179m 2 puffs twice a day with spacer 12/21/20-(Pulmonology) Dr WeMelvyn NovasRecommend Chlortab 4 mg. F/U 3 months. 12/06/20-(Cardiology) Dr HiDebara PickettF/U chest pain. Discontinue fosamax and start Aciphex. Follow up in 6 months.. Marland Kitchen8/18/22-(Pulmonology) Dr WeMelvyn NovasStopped Fosamax and calcium carbonate. Start on Aciphex 20 mg Take 30-60 min before first meal of the day and pepcid AC 20 mg after supper. Follow up in 6 weeks. 08/18/2020 - Cardio (Dr HiDebara PickettMyQuinebaugessage - started fenofibrate 14555maily due to elevated Tg.  05/17/2020 - Cardio (Dr HilDebara Pickettyperlipidemia; Unable to tolerate rosuvastaitn - myalgias. Taking simvastatin 23m53mily. Added ezetimibe 10mg73mly  Hospital visits: None in previous 6 months  Objective:  Lab Results  Component Value Date   CREATININE 0.89 04/23/2021   CREATININE 1.06 11/02/2020   CREATININE 1.24 (H) 10/30/2020    Lab Results  Component Value Date   HGBA1C 7.5 (H) 04/23/2021   Last diabetic Eye exam:  Lab Results  Component Value Date/Time   HMDIABEYEEXA No Retinopathy 07/27/2015 09:06 AM    Last diabetic Foot exam: No results found for: HMDIABFOOTEX      Component Value Date/Time   CHOL 163 12/06/2020 0833   TRIG 112 12/06/2020 0833   HDL 41 12/06/2020 0833   CHOLHDL 4.0 12/06/2020 0833   CHOLHDL 6.3 (H) 12/20/2019 0945   VLDL 30.8 07/01/2019 0921   LDLCALC 102 (H) 12/06/2020 0833   LDLCAWellsburg27/2021 0945     Comment:     .  LDL cholesterol not calculated. Triglyceride levels greater than 400 mg/dL invalidate calculated LDL results. . Reference range: <100 . Desirable range <100 mg/dL for primary prevention;   <70 mg/dL for patients with CHD or diabetic patients  with > or =  2 CHD risk factors. Marland Kitchen LDL-C is now calculated using the Martin-Hopkins  calculation, which is a validated novel method providing  better accuracy than the Friedewald equation in the  estimation of LDL-C.  Cresenciano Genre et al. Annamaria Helling. 2353;614(43): 2061-2068  (http://education.QuestDiagnostics.com/faq/FAQ164)    LDLDIRECT 82.0 11/16/2018 1010    Hepatic Function Latest Ref Rng & Units 04/23/2021 05/17/2020 12/20/2019  Total Protein 6.0 - 8.3 g/dL 6.6 6.6 6.7  Albumin 3.5 - 5.2 g/dL 4.3 4.2 -  AST 0 - 37 U/L '16 12 16  ' ALT 0 - 35 U/L '15 14 16  ' Alk Phosphatase 39 - 117 U/L 61 109 -  Total Bilirubin 0.2 - 1.2 mg/dL 0.4 0.3 0.5  Bilirubin, Direct 0.00 - 0.40 mg/dL - <0.10 0.1    Lab Results  Component Value Date/Time   TSH 2.35 07/01/2019 09:21 AM   TSH 3.11 01/25/2016 09:49 AM    CBC Latest Ref Rng & Units 04/23/2021 12/21/2020 10/26/2020  WBC 4.0 - 10.5 K/uL 9.9 8.5 11.5(H)  Hemoglobin 12.0 - 15.0 g/dL 11.4(L) 11.4(L) 12.3  Hematocrit 36.0 - 46.0 % 36.8 36.1 38.9  Platelets 150.0 - 400.0 K/uL 328.0 333.0 396    Lab Results  Component Value Date/Time   VD25OH 39.17 07/18/2016 08:53 AM    Clinical ASCVD: Yes  The ASCVD Risk score (Arnett DK, et al., 2019) failed to calculate for the following reasons:   The patient has a prior MI or stroke diagnosis    DEXA 02/12/2019 The BMD measured at Femur Neck Right is 0.764 g/cm2 with a T-score of -2.0.    There has been a statistically significant decrease in BMD of Lumbar spine, and no change in BMD of Total Mean hip since prior exam dated 11/10/2015. The scan quality is good. L-3 and L-4 were excluded due to degenerative changes.   Site Region Measured Date Measured Age YA BMD Significant CHANGE T-score   DualFemur Neck Right 02/02/2019       -2.0     DualFemur Neck Right 11/10/2015       -1.8       AP Spine L1-L2 02/02/2019  -1.3  AP Spine  L1-L2 08/18/201   -0.8       DualFemur Total Mean 02/02/2019     -0.7     DualFemur Total  Mean 11/10/2015     -0.9       Low bone mass and a 10 year probability of a hip fracture is 3.3% and probability of a major osteoporosis-related fracture 16.5%  Social History   Tobacco Use  Smoking Status Never  Smokeless Tobacco Never   BP Readings from Last 3 Encounters:  04/23/21 124/80  02/28/21 136/74  01/30/21 140/78   Pulse Readings from Last 3 Encounters:  04/23/21 70  02/28/21 63  01/30/21 (!) 52   Wt Readings from Last 3 Encounters:  04/23/21 201 lb 9.6 oz (91.4 kg)  02/28/21 201 lb 6.4 oz (91.4 kg)  01/30/21 205 lb (93 kg)    Assessment: Review of patient past medical history, allergies, medications, health status, including review of consultants reports, laboratory and other test data, was performed as part of comprehensive evaluation and provision of chronic care management services.   SDOH:  (Social  Determinants of Health) assessments and interventions performed:    CCM Care Plan  Allergies  Allergen Reactions   Crestor [Rosuvastatin] Other (See Comments)    myalgia   Niacin And Related Other (See Comments)    Whelps and skin flushed, mouth tingling    Medications Reviewed Today     Reviewed by [name], RPH-CPP (Pharmacist) on 05/22/21 at 0511  Med List Status: <None>   Medication Order Taking? Sig Documenting Provider Last Dose Status Informant  amLODipine (NORVASC) 10 MG tablet [id number] Yes Take 1 tablet by mouth daily. Copland, Gay Filler, MD Taking Active   aspirin EC 81 MG EC tablet [id number] Yes Take 1 tablet (81 mg total) by mouth daily. Rod Can, MD Taking Active Self           Med Note Antony Contras, [place] B   Tue Feb 13, 2021  9:18 AM)    chlorpheniramine (CHLOR-TRIMETON) 4 MG tablet [id number] Yes Take 4 mg by mouth daily. [provider] Taking Active   cholecalciferol (VITAMIN D) 1000 UNITS tablet [id number] Yes Take 1,000 Units by mouth daily. [provider] Taking Active Self  clopidogrel (PLAVIX) 75 MG tablet  [id number] Yes TAKE 1 TABLET BY MOUTH DAILY. Pixie Casino, MD Taking Active   cyclobenzaprine (FLEXERIL) 10 MG tablet [id number] Yes TAKE 1 TABLET BY MOUTH TWICE DAILY AS NEEDED FOR MUSCLE SPASMS Copland, Gay Filler, MD Taking Active Self           Med Note Gentry Roch   Tue Dec 07, 2019  8:58 AM)    diazepam (VALIUM) 2 MG tablet [id number] Yes Take 1 tablet (2 mg total) by mouth every 8 (eight) hours as needed for muscle spasms. Copland, Gay Filler, MD Taking Active   ezetimibe (ZETIA) 10 MG tablet [id number] Yes TAKE 1 TABLET BY MOUTH ONCE A DAY Hilty, Nadean Corwin, MD Taking Active   famotidine (PEPCID) 10 MG tablet [id number] Yes Take 10 mg by mouth at bedtime. [provider] Taking Active   fenofibrate (TRICOR) 145 MG tablet [id number] Yes Take 1 tablet (145 mg total) by mouth daily. Pixie Casino, MD Taking Active Self  FLUoxetine (PROZAC) 20 MG capsule [id number] Yes TAKE 1 CAPSULE BY MOUTH ONCE DAILY Copland, Gay Filler, MD Taking Active   fluticasone (FLONASE) 50 MCG/ACT nasal spray [id number] Yes Place 1 spray into both nostrils daily as needed for allergies or rhinitis. Copland, Gay Filler, MD Taking Active   icosapent Ethyl (VASCEPA) 1 g capsule [id number] Yes TAKE 2 CAPSULES BY MOUTH TWICE DAILY  Patient taking differently: Take 1 g by mouth 2 (two) times daily.   Copland, Gay Filler, MD Taking Active            Med Note Antony Contras, Adria Dill May 17, 2021  8:41 AM) Patient cannot tolerate 2 grams twice a day  isosorbide mononitrate (IMDUR) 30 MG 24 hr tablet [id number] Yes Take 1 tablet (30 mg total) by mouth daily. Pixie Casino, MD Taking Active   metFORMIN (GLUCOPHAGE-XR) 500 MG 24 hr tablet [id number] Yes Take 1 tablet (500 mg total) by mouth daily with breakfast. Copland, Gay Filler, MD Taking Active Self  metoprolol succinate (TOPROL-XL) 25 MG 24 hr tablet [id number] Yes TAKE 1/2 OF A TABLET BY MOUTH DAILY. Copland, Gay Filler, MD Taking Active   Multiple Vitamin  (MULTIVITAMIN WITH MINERALS) TABS tablet [id number] Yes Take 1 tablet by mouth daily. [provider] Taking Active Self  nitroGLYCERIN (NITROSTAT) 0.4 MG SL  tablet [id number] Yes Place 1 tablet under the tongue every 5 minutes as needed for chest pain. Copland, Gay Filler, MD Taking Active   RABEprazole (ACIPHEX) 20 MG tablet [id number] Yes Take 1 tablet (20 mg total) by mouth daily. 30 minutes prior to food Copland, Gay Filler, MD Taking Active   Discontinued 02/27/20 2121 (Side effect (s))   simvastatin (ZOCOR) 40 MG tablet [id number] Yes Take 1 tablet (40 mg total) by mouth daily. [name], RPH-CPP Taking Active   sodium chloride flush (NS) 0.9 % injection 3 mL [id number]   Deberah Pelton, NP  Active   telmisartan (MICARDIS) 40 MG tablet [id number] Yes TAKE 1 TABLET BY MOUTH EVERY MORNING * NEEDS OFFICE VISIT Pixie Casino, MD Taking Active             Patient Active Problem List   Diagnosis Date Noted   Shortness of breath 01/30/2021   Heartburn 01/30/2021   Chronic rhinitis 01/30/2021   Upper airway cough syndrome 11/09/2020   DOE (dyspnea on exertion) 11/09/2020   AKI (acute kidney injury) ([place]) 10/30/2020   Obstructive sleep apnea treated with continuous positive airway pressure (CPAP) 05/07/2018   Chest pain 01/31/2018   Morbid obesity ([place]) 11/03/2017   Coronary artery disease involving native coronary artery of native heart with unstable angina pectoris ([place]) 11/03/2017   Insomnia 11/03/2017   Inadequate sleep hygiene 11/03/2017   Anxiety 07/21/2017   Dizziness 10/02/2016   Right patella fracture 08/29/2016   Acquired foot deformity, left 07/18/2016   Osteopenia 11/10/2015   HNP (herniated nucleus pulposus), lumbar 10/12/2014   Spinal stenosis at L4-L5 level 10/12/2014   Iron deficiency anemia 07/29/2014   DM (diabetes mellitus) with complications ([place]) 56/31/4970   Environmental and seasonal allergies 04/28/2014   Spinal stenosis, lumbar region, with  neurogenic claudication 01/06/2013   Obesity, morbid, BMI 40.0-49.9 ([place]) 10/20/2012   Chest pain with moderate risk of acute coronary syndrome 10/02/2012   CAD S/P percutaneous coronary angioplasty    Hyperlipidemia with target LDL less than 70    Essential hypertension 04/16/2012    Immunization History  Administered Date(s) Administered   Fluad Quad(high Dose 65+) 11/16/2018, 12/20/2019, 12/20/2020   Influenza Split 01/11/2016   Influenza,inj,Quad PF,6+ Mos 12/01/2013, 01/18/2015, 01/16/2018   Influenza,inj,quad, With Preservative 01/08/2017   Influenza-Unspecified 01/23/2018   PFIZER Comirnaty(Gray Top)Covid-19 Tri-Sucrose Vaccine 07/27/2020   PFIZER(Purple Top)SARS-COV-2 Vaccination 04/20/2019, 05/13/2019, 12/21/2019   Pfizer Covid-19 Vaccine Bivalent Booster 73yr & up 12/20/2020   Pneumococcal Conjugate-13 01/18/2015   Pneumococcal Polysaccharide-23 07/12/2010, 04/21/2017   Td 11/16/2018   Tdap 09/15/2008   Zoster Recombinat (Shingrix) 01/16/2018, 03/23/2018    Conditions to be addressed/monitored: CAD, HTN, HLD, Hypertriglyceridemia, DMII, Anxiety, and obesity  Care Plan : General Pharmacy (Adult)  Updates made by ECherre Robins RPH-CPP since 05/22/2021 12:00 AM     Problem: CHL AMB "PATIENT-SPECIFIC PROBLEM"      Long-Range Goal: Pharmacy Care plan for chronic conditions and medication management   Start Date: 09/27/2020  Priority: High  Note:   Current Barriers:  Unable to achieve control of hyperlipidemia  Cost of therapy for mixed hyperlipidemia high (icosapent ethyl / Vascepa)   Pharmacist Clinical Goal(s):  Over the next 90 days, patient will verbalize ability to afford treatment regimen (icosapent ethyl / Vascepa)  achieve adherence to monitoring guidelines and medication adherence to achieve therapeutic efficacy achieve control of hyperlipidemia as evidenced by LDL <70 maintain control of HTN and type 2 DM as evidenced by BP <140/90 and  A1c < 7.2%  through  collaboration with PharmD and provider.   Interventions: 1:1 collaboration with Copland, Gay Filler, MD regarding development and update of comprehensive plan of care as evidenced by provider attestation and co-signature Inter-disciplinary care team collaboration (see longitudinal plan of care) Comprehensive medication review performed; medication list updated in electronic medical record  Hypertension Controlled; BP goal <130/80 Current regimen:  Telmisartan 62m daily Metoprolol succinate 218m1/2 tab daily Amlodipine 1049maily  Home blood pressure readings - has not checked recently Denies dizziness or symptoms of hypotension Interventions: Requested patient to check blood pressure 3 to 4 times per week and record. Provide at future appointments Ensure daily salt intake < 2300 mg/day Continue to take above hypertensive medications  Hyperlipidemia / CAD:  Not at goals;  LDL goal < 70 and Tg <150 Managed by Dr HilDebara Pickettcardio Current regimen:  Simvastatin 12m46mily  Vascepa 1g - take 2 capsules twice a day (patient is only able to tolerate 1 capsule twice a day) Ezetimibe 10mg94mly  Fenofibrate 145mg 92my (started 08/18/2020) Clopidogrel 75mg d21m  Past medications: rosuvastatin (myalgias); Niacin (flushing and skin rash) Patient reports cost of Vascepa is $100 per refill for 30 days.  Denied recent chest pain; No current myalgias.  Interventions: Reviewed 2023 [place]. Since copay is $100 applied for [facility] with cost. If approved patient will have up to $2500 in 2023 for hypercholesterolemia medications.  Reminded patient she is due to refill simvastatin soon. Discussed importance of adherence.  Continue to follow up with Dr Hilty. Debara Pickettbetes Not at goal with recent increase in A1c;  A1c goal <7% Current regimen:  Metformin ER 500mg da58mPatient has lost about 24 lbs in the last 15 months.  Following low sugar diet and limiting serving sizes.  Interventions: Discussed importance of diet and exercise Discussed possible therapies for diabetes that might also help with weight loss - Ozempic, Trulicity, Jardinace. (Previous appointment)  Recommend check blood sugar once daily, document, and provide at future appointments Contact provider with any episodes of hypoglycemia Continue current regimen for diabetes. Reviewed 2022 refill history of metformin. Adherence was low at beginning of 2022 but has improved. Reminded patient that metformin refill will be due soon and discuss importance of taking daily to lower blood glucose. Will continue to follow adherence.   Depression/Anxiety Controlled Current treatment: Fluoxetine 20mg dai38mInterventions:  Continue fluoxetine  Osteopenia: Current treatment:  Prolia 60mg subc56meously every 6 months.  First Prolia dose was 03/2021. Cost of Prolia was $295 Previously took alendronate but stopped due to cough / reflux.  Last DEXA was 02/20/2021 DualFemur Neck Right 02/20/2021 = -2.0 (previous 02/02/2019 = -2.0 and 11/10/2015 = -1.8)     AP Spine L1-L2 02/20/2021 = -0.0 (previous 02/02/2019  = -1.3 and 11/10/2015 = -0.8)      Low bone mass and a 10 year probability of a hip fracture is 5.4% and probability of a major osteoporosis-related fracture 17.5% Interventions:  Continue Prolia every 6 months.  Currently no copay assistance funds available for Prolia / osteoporosis treatment but will continue to monitor. Discussed fall prevention  Medication management [facility] pharmacy[facility] Interventions Comprehensive medication review performed. Medication list updated to reflect any added or discontinued medications. Continue current medication management strategy Reviewed adherence / refill history. Discussed with patient.   Patient Goals/Self-Care Activities Over the next 90 days, patient will:  take  medications  as prescribed check blood pressure 3 to 4 times per week, document, and provide at future appointments Continue Prolia You should receive letter form [place] regarding hypercholesterolemia funding. Please call to let me know if you were approved or not. [name] [phone] or [phone]    Follow Up Plan: Telephone follow up appointment with care management team member scheduled for:   Aug 14, 2021; Will see PCP March 2023.         Medication Assistance: Application for hypercholesterolemia / [place]  medication assistance program. in process.  Anticipated assistance start date 05/23/2021.  See plan of care for additional detail.  Patient's preferred pharmacy is:  [facility] [address] Phone: [phone] Fax: [place] [address], [place] [address] Phone: [phone] Fax: [place] [address] [place] [address] Phone: [phone] Fax: [phone]   Follow Up:  May 2023 - phone visit with clinical pharmacist; March 2023 follow up with PCP.   [name], PharmD Clinical Pharmacist [facility] [phone]

## 2021-05-22 DIAGNOSIS — Z9861 Coronary angioplasty status: Secondary | ICD-10-CM

## 2021-05-22 DIAGNOSIS — I2510 Atherosclerotic heart disease of native coronary artery without angina pectoris: Secondary | ICD-10-CM

## 2021-05-22 DIAGNOSIS — I10 Essential (primary) hypertension: Secondary | ICD-10-CM

## 2021-05-22 DIAGNOSIS — E785 Hyperlipidemia, unspecified: Secondary | ICD-10-CM

## 2021-05-22 NOTE — Patient Instructions (Signed)
[name] It was a pleasure speaking with you today.  I have attached a summary of our visit today and information about your health goals.  (See below for detailed care plan)   Patient Goals/Self-Care Activities take medications as prescribed check blood pressure 3 to 4 times per week, document, and provide at future appointments Continue Prolia You should receive letter form [place] regarding hypercholesterolemia funding. Please call to let me know if you were approved or not. [name] [phone] or [phone]    If you have any questions or concerns, please feel free to contact me either at the phone number below or with a MyChart message.   Keep up the good work!  [name], PharmD Clinical Pharmacist [facility] [phone] (direct line)  [phone] (main office number)   Chronic Care Management Care Plan   Hypertension BP Readings from Last 3 Encounters:  04/23/21 124/80  02/28/21 136/74  01/30/21 140/78   Pharmacist Clinical Goal(s): Over the next 90 days, patient will work with PharmD and providers to achieve BP goal <130/80 Current regimen:  Telmisartan 40mg  daily Metoprolol succinate 25mg  1/2 tab daily Amlodipine 10mg  daily  Interventions: Requested patient to check blood pressure 3 to 4  times per week and record Coordinated updated prescription for amlodipine for 90 day supply at patient's pharmacy Patient self care activities - Over the next 90 days, patient will: Check blood pressure 3 to 4 times per week, document, and provide at future appointments Ensure daily salt intake < 2300 mg/day  Hyperlipidemia Lipid Panel     Component Value Date/Time   CHOL 163 12/06/2020 0833   TRIG 112 12/06/2020 0833   HDL 41 12/06/2020 0833   CHOLHDL 4.0 12/06/2020 0833   CHOLHDL 6.3 (H) 12/20/2019 0945   VLDL 30.8 07/01/2019 0921   LDLCALC 102 (H) 12/06/2020 0833   [place]  12/20/2019 0945     Comment:      . LDL cholesterol not calculated. Triglyceride levels greater than 400 mg/dL invalidate calculated LDL results. . Reference range: <100 . Desirable range <100 mg/dL for primary prevention;   <70 mg/dL for patients with CHD or diabetic patients  with > or = 2 CHD risk factors. Marland Kitchen LDL-C is now calculated using the Martin-Hopkins  calculation, which is a validated novel method providing  better accuracy than the Friedewald equation in the  estimation of LDL-C.  Cresenciano Genre et al. Annamaria Helling. [id number]): 2061-2068  (http://education.QuestDiagnostics.com/faq/FAQ164)    LDLDIRECT 82.0 11/16/2018 1010   LABVLDL 20 12/06/2020 0833    Pharmacist Clinical Goal(s): Over the next 90 days, patient will work with PharmD and providers to achieve LDL goal < 70 and Tg <150 Current regimen:  Simvastatin 40mg  daily  Vascepa 1g - take 2 capsules twice a day Ezetimibe 10mg  daily  Fenofibrate 145mg  daily Interventions: Encouraged adherence to lipid lowering therapy.   Patient self care activities - Over the next 90 days, patient will: Continue lipid lowering medications listed above.  Continue to follow up with Dr Debara Pickett.   Diabetes Lab Results  Component Value Date/Time   HGBA1C 7.5 (H) 04/23/2021 02:14 PM   HGBA1C 7.3 (H) 10/29/2020 02:26 AM   Pharmacist Clinical Goal(s): Over the next 90 days, patient will work with PharmD and providers to achieve A1c goal <7% Current regimen:  Metformin ER 500mg  daily Interventions: Discussed importance of diet and exercise Discussed possibly therapies for diabetes that might also help with weight loss - Ozempic, Trulicity, Jardinace.  Patient self care activities - Over the next 90 days, patient will: Check blood sugar once daily, document, and provide at future appointments Contact provider with any episodes of hypoglycemia Continue current regimen for diabetes.   Depression/Anxiety Current treatment: Fluoxetine 20mg  daily  Interventions:     Continue fluoxetine at current dose.  Osteopenia: Current regimen:  Prolia 60mg  - every 6 months Last DEXA was 02/20/2021 Interventions:  Continue Prolia every 6 months Discussed fall prevention  Medication management Pharmacist Clinical Goal(s): Over the next 180 days, patient will work with PharmD and providers to achieve optimal medication adherence Current pharmacy: [facility] Interventions Comprehensive medication review performed. Continue current medication management strategy Patient self care activities - Over the next 180 days, patient will: Focus on medication adherence by filling and taking medications appropriately  Take medications as prescribed Report any questions or concerns to PharmD and/or provider(s)  Patient Goals/Self-Care Activities Over the next 90 days, patient will:  take medications as prescribed check blood pressure 3 to 4 times per week, document, and provide at future appointments Continue Prolia You should receive letter form [place] regarding hypercholesterolemia funding. Please call to let me know if you were approved or not. [name] [phone] or [phone]  Patient verbalizes understanding of instructions and care plan provided today and agrees to view in [place]. Active MyChart status confirmed with patient.

## 2021-05-23 DIAGNOSIS — I252 Old myocardial infarction: Secondary | ICD-10-CM | POA: Diagnosis not present

## 2021-05-23 DIAGNOSIS — K219 Gastro-esophageal reflux disease without esophagitis: Secondary | ICD-10-CM | POA: Diagnosis not present

## 2021-05-23 DIAGNOSIS — F419 Anxiety disorder, unspecified: Secondary | ICD-10-CM | POA: Diagnosis not present

## 2021-05-23 DIAGNOSIS — E785 Hyperlipidemia, unspecified: Secondary | ICD-10-CM | POA: Diagnosis not present

## 2021-05-23 DIAGNOSIS — G8929 Other chronic pain: Secondary | ICD-10-CM | POA: Diagnosis not present

## 2021-05-23 DIAGNOSIS — M8580 Other specified disorders of bone density and structure, unspecified site: Secondary | ICD-10-CM | POA: Diagnosis not present

## 2021-05-23 DIAGNOSIS — I2510 Atherosclerotic heart disease of native coronary artery without angina pectoris: Secondary | ICD-10-CM | POA: Diagnosis not present

## 2021-05-23 DIAGNOSIS — E1169 Type 2 diabetes mellitus with other specified complication: Secondary | ICD-10-CM | POA: Diagnosis not present

## 2021-05-23 DIAGNOSIS — Z7982 Long term (current) use of aspirin: Secondary | ICD-10-CM | POA: Diagnosis not present

## 2021-05-23 DIAGNOSIS — F411 Generalized anxiety disorder: Secondary | ICD-10-CM | POA: Diagnosis not present

## 2021-05-23 DIAGNOSIS — I10 Essential (primary) hypertension: Secondary | ICD-10-CM | POA: Diagnosis not present

## 2021-05-23 DIAGNOSIS — E669 Obesity, unspecified: Secondary | ICD-10-CM | POA: Diagnosis not present

## 2021-05-24 ENCOUNTER — Telehealth: Payer: Self-pay | Admitting: Internal Medicine

## 2021-05-24 DIAGNOSIS — E782 Mixed hyperlipidemia: Secondary | ICD-10-CM

## 2021-05-24 NOTE — Telephone Encounter (Signed)
Lipid panel ordered to be completed prior to 06/05/21 visit  ?MyChart lab reminder sent ?

## 2021-05-31 DIAGNOSIS — E782 Mixed hyperlipidemia: Secondary | ICD-10-CM | POA: Diagnosis not present

## 2021-05-31 LAB — LIPID PANEL
Chol/HDL Ratio: 3.9 ratio (ref 0.0–4.4)
Cholesterol, Total: 143 mg/dL (ref 100–199)
HDL: 37 mg/dL — ABNORMAL LOW (ref >39)
LDL Chol Calc (NIH): 84 mg/dL (ref 0–99)
Triglycerides: 124 mg/dL (ref 0–149)
VLDL Cholesterol Cal: 22 mg/dL (ref 5–40)

## 2021-06-01 ENCOUNTER — Telehealth: Payer: Self-pay | Admitting: Pharmacist

## 2021-06-01 NOTE — Telephone Encounter (Signed)
Patient approved for [employer] for hypercholesteremia (Vescepa $100 per month)  ?Approved for $2500 thru 04/16/2022. Patient aware. Since she has not received letter yet, will mail a copy of our letter to her.  ?

## 2021-06-05 ENCOUNTER — Other Ambulatory Visit: Payer: Self-pay

## 2021-06-05 ENCOUNTER — Encounter (HOSPITAL_BASED_OUTPATIENT_CLINIC_OR_DEPARTMENT_OTHER): Payer: Self-pay | Admitting: Internal Medicine

## 2021-06-05 ENCOUNTER — Ambulatory Visit (HOSPITAL_BASED_OUTPATIENT_CLINIC_OR_DEPARTMENT_OTHER): Payer: PPO | Admitting: Internal Medicine

## 2021-06-05 VITALS — BP 138/60 | HR 52 | Ht 61.05 in | Wt 200.5 lb

## 2021-06-05 DIAGNOSIS — E785 Hyperlipidemia, unspecified: Secondary | ICD-10-CM | POA: Diagnosis not present

## 2021-06-05 DIAGNOSIS — I2583 Coronary atherosclerosis due to lipid rich plaque: Secondary | ICD-10-CM

## 2021-06-05 DIAGNOSIS — E119 Type 2 diabetes mellitus without complications: Secondary | ICD-10-CM

## 2021-06-05 DIAGNOSIS — T466X5A Adverse effect of antihyperlipidemic and antiarteriosclerotic drugs, initial encounter: Secondary | ICD-10-CM

## 2021-06-05 DIAGNOSIS — T466X5D Adverse effect of antihyperlipidemic and antiarteriosclerotic drugs, subsequent encounter: Secondary | ICD-10-CM | POA: Diagnosis not present

## 2021-06-05 DIAGNOSIS — M791 Myalgia, unspecified site: Secondary | ICD-10-CM

## 2021-06-05 DIAGNOSIS — I2510 Atherosclerotic heart disease of native coronary artery without angina pectoris: Secondary | ICD-10-CM

## 2021-06-05 NOTE — Patient Instructions (Signed)
Medication Instructions:  ?Your physician recommends that you continue on your current medications as directed. Please refer to the Current Medication list given to you today. ? ?*If you need a refill on your cardiac medications before your next appointment, please call your pharmacy* ? ? ?Lab Work: ?FASTING lab work to check cholesterol in 1 year -- before next visit ? ?If you have labs (blood work) drawn today and your tests are completely normal, you will receive your results only by: ?MyChart Message (if you have MyChart) OR ?A paper copy in the mail ?If you have any lab test that is abnormal or we need to change your treatment, we will call you to review the results. ? ? ?Testing/Procedures: ?NONE ? ? ?Follow-Up: ?At [facility], you and your health needs are our priority.  As part of our continuing mission to provide you with exceptional heart care, we have created designated Provider Care Teams.  These Care Teams include your primary Cardiologist (physician) and Advanced Practice Providers (APPs -  Physician Assistants and Nurse Practitioners) who all work together to provide you with the care you need, when you need it. ? ?We recommend signing up for the patient portal called "MyChart".  Sign up information is provided on this After Visit Summary.  MyChart is used to connect with patients for Virtual Visits (Telemedicine).  Patients are able to view lab/test results, encounter notes, upcoming appointments, etc.  Non-urgent messages can be sent to your provider as well.   ?To learn more about what you can do with MyChart, go to [email or web address].   ? ?Your next appointment:   ?1 year with Dr. Rennis Golden  ? ?

## 2021-06-05 NOTE — Progress Notes (Signed)
? ? ? ?Date:  06/05/2021  ? ?ID:  DAJON HUTCHERSON, DOB October 08, 1950, MRN [id number] ? ?PCP:  Darreld Mclean, MD  ?Primary Cardiologist:  Tenisha Fleece ? ?CC:  ?Follow-up ?  ?History of Present Illness: ?[name] is a 71 y.o. female with a history of CAD with DES stent to prox. LAD in 2010 with residual disease in LCX 10-20% and RCA 20% lesion.  She also has HTN, DM, dyslipidemia and obesity.  She presented on 10/01/12 to the ED with c/o chest pressure. Discomfort began at work and associated with flushing symptoms secondary to niacin. No DOE, SOB, nausea or diaphoresis. She then developed chest pressure and nausea and went to Urgent care. She thought the flushing was secondary to stopping ASA. She was given ASA, cooled down and then had chest presssure. Sent to [facility] ER and NTG SL did resolve the pressure. She was very concerned about the pressure because it was the same that she had with her Agency in 2010 that lead to her stent.  Coronary angiogram revealed widely patent proximal LAD stent with otherwise normal coronary arteries and normal LV function. ? ?Mrs. [name] recently underwent back surgery and is doing fairly well. She still is having some back pain but is adamant about not taking medication. She plans to go back to work on Monday next week. Unfortunately she is still grieving from her brother who passed away last week. He had stage IV pancreatic cancer and lived less than one year. ? ?Mrs. [name] returns today for follow-up. Overall she denies any chest pain or worsening shortness of breath. Recently she's been having some palpitations, however she attributes that to stress. She recently is been placed on CPAP and seems to be tolerating that well. She's had some problems with memory loss but neurologic workup is been unremarkable. She recently had worsening back problems and has been to see Dr. Tonita Cong. He is considering surgery on her back but would need her to come off of her antiplatelets medications. ? ?At the  pleasure seeing Mrs. [name] back today in follow-up. She underwent successful back surgery and is doing better. She's managed to lose significant amount of weight. She's remained on Toprol which was started preoperatively. Her surgery was without complication. Recently she's had some dizziness with change in position. This could be related to dietary changes however I did note that her diastolic pressure is low today at 56. She denies any chest pain or worsening shortness of breath. ? ?02/12/2016 ? ?Mrs. [name] returns today for follow-up. She is recently been having some more palpitations. She reports being under significant stress as her daughter and granddaughter are now living with them. She gets some fullness in her chest when she has palpitations. These episodes occur 2-3 times a week over the past month or so. She denies any exertional chest pain or worsening shortness of breath. Weight is Caryl Pina down 3 pounds since her last office visit. Blood pressure is well-controlled. She recently started Celebrex and I spoke with her primary care provider about this. She needs to be careful about taking it regularly in the setting of aspirin and Plavix use. ? ?03/07/2016 ? ?I saw [name] back today for follow-up of her monitor. She reports she had 2 episodes of palpitations and some elevated blood pressure on the day of Thanksgiving. She was dealing with her sister who had recently had a brain bleed and had to take her to the emergency room. She apparently had some seizures. She was  also trying to cook Thanksgiving meals and felt overwhelmed. She did take some Valium with some benefit however one point said her blood pressure was up to 210/100. Since then she's felt much better. We did not to capture any episodes of arrhythmias, extrasystoles or anything concerning on her monitor. I suspect her symptoms are related to stress and anxiety. Blood pressure is fairly well-controlled today. ? ?10/02/2016 ? ?[name] returns today for  follow-up. Unfortunate she recently broke her leg. She says she has a problem with some foot drag and she misstepped. She had have surgery and is still wearing a brace in the right leg. She is improving and has better range of motion. Blood pressure is good today 138/62. She reports recently she's been having some dizziness. Sometimes positional with sitting up at other times not positional. She is afraid of falling. She's been very anxious. In fact she's been taking Valium although she says only a few times a week. She does not think that is related to either the Valium or her pain medicine. She is concerned it could be her blood pressure although has not noted any low blood pressure readings. ? ?07/21/2017 ? ?[name] was seen today in follow-up.  She is without any cardiac complaints.  In September she was hospitalized with chest pain symptoms.  She ruled out for MI and underwent stress testing which was low risk.  Subsequently she was noted to have significant anxiety, some degree of agoraphobia, and fear of walking by herself.  She was started on fluoxetine and has been referred to a psychologist.  She is also undergone some cognitive behavioral therapy which has been helpful. ? ?02/04/2018 ? ?[name] was seen today in follow-up of recent hospitalization for chest pain.  She was seen by my partner Dr. Algernon Huxley who felt that her pain was pleuritic, positional and reproducible on exam and not likely cardiac.  She ruled out for MI.  According to her husband recently she has been caring more things and doing more exertion and he feels that she may have strained her back.  She does describe pain and tenderness which was reproducible along the right mid thoracic area that seems to wrap around the anterior chest.  The symptoms are still persistent but somewhat better. ? ?05/17/2020 ? ?[name] is seen today in follow-up.  She has been followed by Coletta Memos, NP more recently.  She has had some intolerance to her medications,  namely her Vascepa which she currently takes 1 g twice daily.  She was switched to rosuvastatin but she had significant myalgias with this.  She went back to simvastatin 40 mg.  She has not had her lipids reassessed.  Her target LDL is less than 70 given coronary disease although fortunately recent cardiac catheterization showed no significant obstructive disease.  She denies any chest pain or worsening shortness of breath and has lost a couple pounds recently according to her home scale ? ?12/06/2020 ? ?Alexandrine returns today for follow-up.  Overall she reports she is feeling much better.  She saw Laurann Montana over the summer and was complaining of some chest pain.  She underwent a Myoview stress test which was negative for ischemia.  Subsequently she presented the ER with ongoing left sternal chest pain.  She underwent cardiac catheterization which demonstrated a patent LAD stent and no other significant coronary disease.  LV function was normal by echo.  Ultimately she had a follow-up with Dr. Melvyn Novas.  He thought that her symptoms might be related  to reflux in fact had recommended she stop Fosamax.  She was changed to Aciphex.  Since then her symptoms have resolved.  I suspect he was right in that diagnosis.  She does have follow-up with him.  I had added fenofibrate for her triglycerides which were well over 500.  She is due for repeat lipids and is fasting today. ? ?06/05/2021 ? ?Shambre returns today for follow-up.  Overall she seems to be doing well.  Her lipids appear much better.  Total cholesterol 143, triglycerides 124, HDL 37 LDL 84.  She seems to be tolerating the medicines without any issues.  She denies any recurrent chest pain.  Her A1c crept up a little at 7.5% in January but she says she is lost weight and now is below 200 pounds.  She is trying to get down to about 160 pounds. ? ?Wt Readings from Last 3 Encounters:  ?06/05/21 200 lb 8 oz (90.9 kg)  ?04/23/21 201 lb 9.6 oz (91.4 kg)  ?02/28/21 201 lb 6.4  oz (91.4 kg)  ?  ? ?Past Medical History:  ?Diagnosis Date  ? Anemia   ? Arthritis   ? Bronchitis   ? hx of   ? CAD (coronary artery disease)   ? a. s/p DES to LAD in 2010 with patent stent by cath in 20

## 2021-06-15 DIAGNOSIS — G4733 Obstructive sleep apnea (adult) (pediatric): Secondary | ICD-10-CM | POA: Diagnosis not present

## 2021-06-18 ENCOUNTER — Other Ambulatory Visit (HOSPITAL_COMMUNITY): Payer: Self-pay

## 2021-06-20 ENCOUNTER — Ambulatory Visit: Payer: PPO | Admitting: Family Medicine

## 2021-06-21 NOTE — Patient Instructions (Addendum)
It was good to see you again today, please see me in about 6 months assuming all is well ?If you continue to be concerned about your memory we can have you see neurology for a more formal assessment ?Continue to work on exercise to the best of your ability ?We will check for iron deficiency and check on your thyroid as well  ? ?

## 2021-06-21 NOTE — Progress Notes (Addendum)
[facility] ?[address], [address] ?[place], [place] [address] ?336 [phone] ?Fax [phone] ? ?Date:  06/27/2021  ? ?Name:  [name]   DOB:  [birth year]   MRN:  [id number] ? ?PCP:  Pearline Cablesopland, Katelyne Galster C, MD  ? ? ?Chief Complaint: 6 month follow up (Concerns/ questions: 1. Pt says her arthritis in the R hand is worsening. /) ? ? ?History of Present Illness: ? ?[name] is a 71 y.o. very pleasant female patient who presents with the following: ? ?Patient seen today for periodic follow-up ?[place]from our visit at that time: ?History of hypertension, CAD, diabetes, sleep apnea, obesity, osteopenia with elevated fracture risk on fosamax ?Status post PCI 2010 and 2014 ?Last summer she was admitted with chest pain and had a cath, which was negative ?She had been on Fosamax-stopped by pulmonology, Dr. Sherene SiresWert in case it was worsening her reflux and shortness of breath ?She does feel like her GERD sx are better- she in aciphex still ?She is seeing Wert just PRN now  ?For shot of Prolia today ? ?She got her first shot of Prolia in January ?Seen by Dr. Rennis GoldenHilty March 14: ? ?ASSESSMENT: ?Musculoskeletal chest pain -cath with no significant coronary disease and a patent stent in the LAD (10/2020) ?Normal Lexiscan Myoview and normal echo in 10/2020 ?Chronic RBBB ?Coronary artery disease status post DES the proximal LAD in 2010, low risk Myoview (11/2016)-LVEF 78% -no significant obstructive disease by cath in September 2021 ?Hypertension ?Dyslipidemia ?Obesity ?Type 2 diabetes ?Palpitations ?OSA on CPAP ?Anxiety ?PLAN: ?1.  [name] has had significant improvement in her lipids now with total cholesterol 143 and LDL 84.  Triglycerides have normalized.  She denies any recurrent chest pain.  We will plan to continue her current therapies.  She can follow-up with me annually or sooner as necessary ? ?Colon cancer screening is due this year ?Current diabetes  therapy metformin XR 500 once daily ?Lab Results  ?Component Value Date  ? HGBA1C 7.5 (H) 04/23/2021  ? ?Pt notes she may be a bit more forgetful recently- her husband has noted this  ?They have noted this for about a month or so ?She did see neurology for this issue she thinks several years ago.   ?She has felt a bit tired- history of mild anemia ? ?Lab Results  ?Component Value Date  ? TSH 2.35 07/01/2019  ? ?She has lost a few lbs intentionally- she and her husband are working on eating healthier  ?She is trying to walk more.  She will go to a store and use a cart to support herself -she still has difficulty with her right knee which may not always support her ? ?She notes more pain in the DIP joints of her right hand.  She notes Aspercreme spray has been the most effective treatment for her.  She does take Tylenol has tried Voltaren gel ?She also saw Dr. Amanda PeaGramig with orthopedics in the past, she reports he did an injection but unfortunately it did not help for very long ? ?Wt Readings from Last 3 Encounters:  ?06/27/21 197 lb 6.4 oz (89.5 kg)  ?06/05/21 200 lb 8 oz (90.9 kg)  ?04/23/21 201 lb 9.6 oz (91.4 kg)  ? ? ? ?Lab Results  ?Component Value Date  ? FERRITIN 100.4 06/18/2017  ? ? ?Patient Active Problem List  ? Diagnosis Date Noted  ? Shortness of breath 01/30/2021  ?  Heartburn 01/30/2021  ? Chronic rhinitis 01/30/2021  ? Upper airway cough syndrome 11/09/2020  ? DOE (dyspnea on exertion) 11/09/2020  ? AKI (acute kidney injury) (HCC) 10/30/2020  ? Obstructive sleep apnea treated with continuous positive airway pressure (CPAP) 05/07/2018  ? Chest pain 01/31/2018  ? Morbid obesity (HCC) 11/03/2017  ? Coronary artery disease involving native coronary artery of native heart with unstable angina pectoris (HCC) 11/03/2017  ? Insomnia 11/03/2017  ? Inadequate sleep hygiene 11/03/2017  ? Anxiety 07/21/2017  ? Dizziness 10/02/2016  ? Right patella fracture 08/29/2016  ? Acquired foot deformity, left 07/18/2016  ?  Osteopenia 11/10/2015  ? HNP (herniated nucleus pulposus), lumbar 10/12/2014  ? Spinal stenosis at L4-L5 level 10/12/2014  ? Iron deficiency anemia 07/29/2014  ? DM (diabetes mellitus) with complications (HCC) 07/29/2014  ? Environmental and seasonal allergies 04/28/2014  ? Spinal stenosis, lumbar region, with neurogenic claudication 01/06/2013  ? Obesity, morbid, BMI 40.0-49.9 (HCC) 10/20/2012  ? Chest pain with moderate risk of acute coronary syndrome 10/02/2012  ? CAD S/P percutaneous coronary angioplasty   ? Hyperlipidemia with target LDL less than 70   ? Essential hypertension 04/16/2012  ? ? ?Past Medical History:  ?Diagnosis Date  ? Anemia   ? Arthritis   ? Bronchitis   ? hx of   ? CAD (coronary artery disease)   ? a. s/p DES to LAD in 2010 with patent stent by cath in 2014 b. low-risk NST in 11/2016  ? Cataracts, bilateral   ? Diabetes mellitus without complication (HCC)   ? Family history of adverse reaction to anesthesia   ? pts mother had difficulty awakening   ? GERD (gastroesophageal reflux disease)   ? Hyperlipidemia LDL goal < 70   ? Hypertension   ? Lumbar back pain   ? Numbness   ? left leg and foot   ? Plantar fascia rupture   ? Left Foot  ? Sleep apnea   ? uses CPAP   ? ? ?Past Surgical History:  ?Procedure Laterality Date  ? ABDOMINAL HYSTERECTOMY    ? BACK SURGERY    ? BREAST CYST ASPIRATION  1995  ? CAROTID STENT  2009  ? pt denies   ? CORONARY ANGIOPLASTY WITH STENT PLACEMENT  2010and 10-02-2012  ? Stent DES, Xience to prox. LAD  ? DOPPLER ECHOCARDIOGRAPHY  08/01/2009  ? EF=>55%,LV normal  ? LEFT HEART CATH AND CORONARY ANGIOGRAPHY N/A 12/10/2019  ? Procedure: LEFT HEART CATH AND CORONARY ANGIOGRAPHY;  Surgeon: Lyn Records, MD;  Location: [facility] INVASIVE CV LAB;  Service: Cardiovascular;  Laterality: N/A;  ? LEFT HEART CATH AND CORONARY ANGIOGRAPHY N/A 10/30/2020  ? Procedure: LEFT HEART CATH AND CORONARY ANGIOGRAPHY;  Surgeon: Runell Gess, MD;  Location: [facility] INVASIVE CV LAB;  Service:  Cardiovascular;  Laterality: N/A;  ? LEFT HEART CATHETERIZATION WITH CORONARY ANGIOGRAM N/A 10/02/2012  ? Procedure: LEFT HEART CATHETERIZATION WITH CORONARY ANGIOGRAM;  Surgeon: Runell Gess, MD;  Location: [facility] CATH LAB;  Service: Cardiovascular;  Laterality: N/A;  ? lower arterial duplex  06/20/10  ? abi's normal,rgt 0.98,lft 1.06;bilateral PVRs normal  ? LUMBAR LAMINECTOMY/DECOMPRESSION MICRODISCECTOMY Left 01/06/2013  ? Procedure: MICRO LUMBAR DECOMPRESSION L4-5 AND L5-S1;  Surgeon: Javier Docker, MD;  Location: [facility] ORS;  Service: Orthopedics;  Laterality: Left;  ? LUMBAR LAMINECTOMY/DECOMPRESSION MICRODISCECTOMY Left 10/12/2014  ? Procedure: REVISION MICRO LUMBAR/DECOMPRESSION L4-5 LEFT ;  Surgeon: Jene Every, MD;  Location: [facility] ORS;  Service: Orthopedics;  Laterality: Left;  ? NM MYOCAR  PERF WALL MOTION  09/22/2008  ? lexiscan-EF 83%; glogal LV systolic fx is norm. ,evidence of mild ischemia basal anterior,midanterior and apical lateral region(s).   ? ORIF PATELLA Right 08/29/2016  ? Procedure: OPEN REDUCTION INTERNAL (ORIF) FIXATION RIGHT PATELLA;  Surgeon: Samson Frederic, MD;  Location: [facility] ORS;  Service: Orthopedics;  Laterality: Right;  Adductor Block  ? TUBAL LIGATION    ? TYMPANOPLASTY Bilateral   ? UVULOPALATOPHARYNGOPLASTY    ? pt denies   ? ? ?Social History  ? ?Tobacco Use  ? Smoking status: Never  ? Smokeless tobacco: Never  ?Vaping Use  ? Vaping Use: Never used  ?Substance Use Topics  ? Alcohol use: Yes  ?  Comment: occasional, 1 a month wine  ? Drug use: No  ? ? ?Family History  ?Problem Relation Age of Onset  ? Coronary artery disease Mother   ? Rheum arthritis Mother   ? Dementia Mother   ? Heart attack Father   ? Hypertension Father   ? Hyperlipidemia Father   ? Other Father   ?     MVA  ? Hypertension Brother   ? Cancer Brother   ? Heart disease Brother   ? Cancer Paternal Grandmother   ?     stomach  ? Diabetes Paternal Grandfather   ? Breast cancer Neg Hx   ? ? ?Allergies  ?Allergen  Reactions  ? Crestor [Rosuvastatin] Other (See Comments)  ?  myalgia  ? Niacin And Related Other (See Comments)  ?  [place] and skin flushed, mouth tingling  ? ? ?Medication list has been reviewed and updated. ? ?Current O

## 2021-06-27 ENCOUNTER — Ambulatory Visit (INDEPENDENT_AMBULATORY_CARE_PROVIDER_SITE_OTHER): Payer: PPO | Admitting: Family Medicine

## 2021-06-27 ENCOUNTER — Other Ambulatory Visit: Payer: Self-pay | Admitting: Family Medicine

## 2021-06-27 ENCOUNTER — Encounter: Payer: Self-pay | Admitting: Family Medicine

## 2021-06-27 ENCOUNTER — Other Ambulatory Visit (HOSPITAL_COMMUNITY): Payer: Self-pay

## 2021-06-27 VITALS — BP 127/70 | HR 55 | Temp 97.9°F | Resp 18 | Ht 61.5 in | Wt 197.4 lb

## 2021-06-27 DIAGNOSIS — E785 Hyperlipidemia, unspecified: Secondary | ICD-10-CM

## 2021-06-27 DIAGNOSIS — M19041 Primary osteoarthritis, right hand: Secondary | ICD-10-CM | POA: Diagnosis not present

## 2021-06-27 DIAGNOSIS — D649 Anemia, unspecified: Secondary | ICD-10-CM

## 2021-06-27 DIAGNOSIS — R5383 Other fatigue: Secondary | ICD-10-CM | POA: Diagnosis not present

## 2021-06-27 DIAGNOSIS — R413 Other amnesia: Secondary | ICD-10-CM | POA: Diagnosis not present

## 2021-06-27 DIAGNOSIS — I10 Essential (primary) hypertension: Secondary | ICD-10-CM

## 2021-06-27 DIAGNOSIS — E119 Type 2 diabetes mellitus without complications: Secondary | ICD-10-CM

## 2021-06-27 DIAGNOSIS — E118 Type 2 diabetes mellitus with unspecified complications: Secondary | ICD-10-CM

## 2021-06-27 LAB — CBC
HCT: 35.9 % — ABNORMAL LOW (ref 36.0–46.0)
Hemoglobin: 11.4 g/dL — ABNORMAL LOW (ref 12.0–15.0)
MCHC: 31.7 g/dL (ref 30.0–36.0)
MCV: 79.3 fl (ref 78.0–100.0)
Platelets: 332 10*3/uL (ref 150.0–400.0)
RBC: 4.52 Mil/uL (ref 3.87–5.11)
RDW: 15 % (ref 11.5–15.5)
WBC: 9.2 10*3/uL (ref 4.0–10.5)

## 2021-06-27 LAB — HEMOGLOBIN A1C: Hgb A1c MFr Bld: 7.9 % — ABNORMAL HIGH (ref 4.6–6.5)

## 2021-06-27 LAB — FERRITIN: Ferritin: 49.7 ng/mL (ref 10.0–291.0)

## 2021-06-27 LAB — TSH: TSH: 3.29 u[IU]/mL (ref 0.35–5.50)

## 2021-06-27 MED ORDER — METFORMIN HCL ER 500 MG PO TB24
500.0000 mg | ORAL_TABLET | Freq: Two times a day (BID) | ORAL | 3 refills | Status: DC
  Filled 2021-06-27: qty 180, 90d supply, fill #0
  Filled 2021-09-20: qty 180, 90d supply, fill #1
  Filled 2022-01-15: qty 180, 90d supply, fill #2

## 2021-06-28 ENCOUNTER — Other Ambulatory Visit (HOSPITAL_COMMUNITY): Payer: Self-pay

## 2021-07-03 ENCOUNTER — Other Ambulatory Visit (HOSPITAL_COMMUNITY): Payer: Self-pay

## 2021-07-03 ENCOUNTER — Other Ambulatory Visit: Payer: Self-pay | Admitting: Pharmacist

## 2021-07-03 MED ORDER — SIMVASTATIN 40 MG PO TABS
40.0000 mg | ORAL_TABLET | Freq: Every day | ORAL | 3 refills | Status: DC
  Filled 2021-07-03: qty 90, 90d supply, fill #0
  Filled 2021-10-26: qty 90, 90d supply, fill #1
  Filled 2022-02-11: qty 90, 90d supply, fill #2

## 2021-07-03 NOTE — Telephone Encounter (Signed)
Received request from [facility] for updated Rx for simvastatin 40mg  daily but per patient's med list it was noted that she was taking simvastatin 20mg  per last cardio visit.  ?Refill records show that last 3 refills was for simvastatin 40mg  daily on 05/09/2020; 11/06/2020 and 11/20222.  ?Unable to reach patient but LM on VM.  ? ?

## 2021-07-03 NOTE — Telephone Encounter (Signed)
Patient called back. Verified she is still taking simvastatin 40mg  daily. Updated prescription sent to pharmacy. ?

## 2021-07-08 ENCOUNTER — Telehealth: Payer: Self-pay

## 2021-07-08 NOTE — Telephone Encounter (Signed)
Last Prolia inj 04/23/21 ?Next Prolia inj due 10/22/21 ?

## 2021-07-23 ENCOUNTER — Other Ambulatory Visit (HOSPITAL_COMMUNITY): Payer: Self-pay

## 2021-07-30 ENCOUNTER — Other Ambulatory Visit: Payer: Self-pay | Admitting: Family Medicine

## 2021-07-30 DIAGNOSIS — E785 Hyperlipidemia, unspecified: Secondary | ICD-10-CM

## 2021-07-31 ENCOUNTER — Encounter (HOSPITAL_BASED_OUTPATIENT_CLINIC_OR_DEPARTMENT_OTHER): Payer: Self-pay | Admitting: Internal Medicine

## 2021-07-31 ENCOUNTER — Other Ambulatory Visit (HOSPITAL_COMMUNITY): Payer: Self-pay

## 2021-07-31 DIAGNOSIS — E785 Hyperlipidemia, unspecified: Secondary | ICD-10-CM

## 2021-07-31 MED ORDER — ICOSAPENT ETHYL 1 G PO CAPS
ORAL_CAPSULE | Freq: Two times a day (BID) | ORAL | 0 refills | Status: DC
  Filled 2021-07-31: qty 60, 15d supply, fill #0
  Filled 2021-07-31: qty 120, 30d supply, fill #0
  Filled 2021-08-02: qty 60, 15d supply, fill #1

## 2021-08-01 NOTE — Telephone Encounter (Signed)
Please advise 

## 2021-08-02 ENCOUNTER — Other Ambulatory Visit (HOSPITAL_COMMUNITY): Payer: Self-pay

## 2021-08-10 ENCOUNTER — Other Ambulatory Visit (HOSPITAL_COMMUNITY): Payer: Self-pay

## 2021-08-10 MED ORDER — ICOSAPENT ETHYL 1 G PO CAPS
ORAL_CAPSULE | Freq: Two times a day (BID) | ORAL | 3 refills | Status: DC
  Filled 2021-08-10: qty 240, 60d supply, fill #0
  Filled 2021-09-20: qty 120, 30d supply, fill #0
  Filled 2021-12-18: qty 360, 90d supply, fill #1
  Filled 2022-03-10: qty 360, 90d supply, fill #2

## 2021-08-12 ENCOUNTER — Other Ambulatory Visit: Payer: Self-pay | Admitting: Family Medicine

## 2021-08-12 DIAGNOSIS — I10 Essential (primary) hypertension: Secondary | ICD-10-CM

## 2021-08-13 ENCOUNTER — Other Ambulatory Visit (HOSPITAL_COMMUNITY): Payer: Self-pay

## 2021-08-13 MED ORDER — METOPROLOL SUCCINATE ER 25 MG PO TB24
ORAL_TABLET | ORAL | 1 refills | Status: DC
  Filled 2021-08-13: qty 45, 90d supply, fill #0
  Filled 2021-11-06: qty 45, 90d supply, fill #1

## 2021-08-14 ENCOUNTER — Ambulatory Visit (INDEPENDENT_AMBULATORY_CARE_PROVIDER_SITE_OTHER): Payer: PPO | Admitting: Pharmacist

## 2021-08-14 ENCOUNTER — Other Ambulatory Visit (HOSPITAL_COMMUNITY): Payer: Self-pay

## 2021-08-14 DIAGNOSIS — M8580 Other specified disorders of bone density and structure, unspecified site: Secondary | ICD-10-CM

## 2021-08-14 DIAGNOSIS — I2510 Atherosclerotic heart disease of native coronary artery without angina pectoris: Secondary | ICD-10-CM

## 2021-08-14 DIAGNOSIS — E785 Hyperlipidemia, unspecified: Secondary | ICD-10-CM

## 2021-08-14 DIAGNOSIS — E118 Type 2 diabetes mellitus with unspecified complications: Secondary | ICD-10-CM

## 2021-08-14 DIAGNOSIS — I10 Essential (primary) hypertension: Secondary | ICD-10-CM

## 2021-08-14 NOTE — Chronic Care Management (AMB) (Signed)
Chronic Care Management Pharmacy Note  08/14/2021 Name:  [name] MRN:  [id number] DOB:  11-03-1950  Summary:  Mixed hyperlipidemia - Improving but LDL not at goal. Managed by Dr Debara Pickett, cardiology. Approved assistance through [facility] for cholesterol medications through 03/2022. Osteopenia with high fracture risk - started Prolia January 2023. Cost was $295. Checked to see if post menopausal osteoporosis [place] is open but no current funding available for osteoporosis. Will continue to monitor. Next Prolia due in July 2023. Discussed adherence and reviewed recent refill records.  Assisted in requesting RF for isosorbide from [facility].    Subjective: [name] is an 71 y.o. year old female who is a primary patient of Copland, Gay Filler, MD.  The CCM team was consulted for assistance with disease management and care coordination needs.    Engaged with patient by telephone for follow up visit in response to provider referral for pharmacy case management and/or care coordination services.   Consent to Services:  The patient was given information about Chronic Care Management services, agreed to services, and gave verbal consent prior to initiation of services.  Please see initial visit note for detailed documentation.   Patient Care Team: Copland, Gay Filler, MD as PCP - General (Family Medicine) Debara Pickett Nadean Corwin, MD as PCP - Cardiology (Cardiology) [name], RPH-CPP (Pharmacist)  Recent office visits: 06/27/2021 - Fam Med (Dr Lorelei Pont) Follow up chronic conditions and arthritis in right hand. Noted increase in A1c - increased metformin from ER 563m  daily to twice a day 04/23/2021 - Fam Med (Dr CLorelei Pont F/U diabetes and other chronic conditions  Recent consult visits: 06/05/2021 - Cardio (Dr HDebara Pickett Seen for f/u CAD and hyperlipidemia.  03/01/2021 - [facility] ([facility] Diabetic eye exam. No med changes.  Follow up in 1 year 02/28/2021 - Pulm (Dr WMelvyn Novas Seen for SOB / cough. Suspected reflux related. No evidence of asthma or allergies per notes. Recommended OTC Zyrtec 172mas needed and chlorpheniramine as needed. Stopped inhaler therapy. Follow up as needed.  01/30/2021 - Allergist (Dr YoScherrie BatemanSeen for SOB / initial evaluation. Allergy test neg. Ordered panel for environmental allergies. Spirometry showed restrictive disease. Started Flovent 11037m2 puffs twice a day with spacer  Hospital visits: None in previous 6 months  Objective:  Lab Results  Component Value Date   CREATININE 0.89 04/23/2021   CREATININE 1.06 11/02/2020   CREATININE 1.24 (H) 10/30/2020    Lab Results  Component Value Date   HGBA1C 7.9 (H) 06/27/2021   Last diabetic Eye exam:  Lab Results  Component Value Date/Time   HMDIABEYEEXA No Retinopathy 07/27/2015 09:06 AM    Last diabetic Foot exam: No results found for: HMDIABFOOTEX      Component Value Date/Time   CHOL 143 05/31/2021 0912   TRIG 124 05/31/2021 0912   HDL 37 (L) 05/31/2021 0912   CHOLHDL 3.9 05/31/2021 0912   CHOLHDL 6.3 (H) 12/20/2019 0945   VLDL 30.8 07/01/2019 0921   LDLCALC 84 05/31/2021 0912   LDLCALC  12/20/2019 0945     Comment:     . LDL cholesterol not calculated. Triglyceride levels greater than 400 mg/dL invalidate calculated LDL results. . Reference range: <100 . Desirable range <100 mg/dL for primary prevention;   <70 mg/dL for patients with CHD or diabetic patients  with > or = 2 CHD risk factors. . LMarland KitchenL-C is now calculated using the Martin-Hopkins  calculation, which is a validated  novel method providing  better accuracy than the Friedewald equation in the  estimation of LDL-C.  Cresenciano Genre et al. Annamaria Helling. 5176;160(73): 2061-2068  (http://education.QuestDiagnostics.com/faq/FAQ164)    LDLDIRECT 82.0 11/16/2018 1010       Latest Ref Rng & Units 04/23/2021    2:14 PM 05/17/2020    9:40 AM 12/20/2019    9:45 AM  Hepatic  Function  Total Protein 6.0 - 8.3 g/dL 6.6   6.6   6.7    Albumin 3.5 - 5.2 g/dL 4.3   4.2     AST 0 - 37 U/L _0 ALT 0 - 35 U/L _1 Alk Phosphatase 39 - 117 U/L 61   109     Total Bilirubin 0.2 - 1.2 mg/dL 0.4   0.3   0.5    Bilirubin, Direct 0.00 - 0.40 mg/dL  <0.10   0.1      Lab Results  Component Value Date/Time   TSH 3.29 06/27/2021 08:37 AM   TSH 2.35 07/01/2019 09:21 AM       Latest Ref Rng & Units 06/27/2021    8:37 AM 04/23/2021    2:14 PM 12/21/2020    9:32 AM  CBC  WBC 4.0 - 10.5 K/uL 9.2   9.9   8.5    Hemoglobin 12.0 - 15.0 g/dL 11.4   11.4   11.4    Hematocrit 36.0 - 46.0 % 35.9   36.8   36.1    Platelets 150.0 - 400.0 K/uL 332.0   328.0   333.0      Lab Results  Component Value Date/Time   VD25OH 39.17 07/18/2016 08:53 AM    Clinical ASCVD: Yes  The ASCVD Risk score (Arnett DK, et al., 2019) failed to calculate for the following reasons:   The patient has a prior MI or stroke diagnosis    DEXA 02/12/2019 The BMD measured at Femur Neck Right is 0.764 g/cm2 with a T-score of -2.0.    There has been a statistically significant decrease in BMD of Lumbar spine, and no change in BMD of Total Mean hip since prior exam dated 11/10/2015. The scan quality is good. L-3 and L-4 were excluded due to degenerative changes.   Site Region Measured Date Measured Age YA BMD Significant CHANGE T-score   DualFemur Neck Right 02/02/2019       -2.0     DualFemur Neck Right 11/10/2015       -1.8       AP Spine L1-L2 02/02/2019  -1.3  AP Spine  L1-L2 08/18/201   -0.8       DualFemur Total Mean 02/02/2019     -0.7     DualFemur Total Mean 11/10/2015     -0.9       Low bone mass and a 10 year probability of a hip fracture is 3.3% and probability of a major osteoporosis-related fracture 16.5%  Social History   Tobacco Use  Smoking Status Never  Smokeless Tobacco Never   BP Readings from Last 3 Encounters:  06/27/21 127/70  06/05/21 138/60   04/23/21 124/80   Pulse Readings from Last 3 Encounters:  06/27/21 (!) 55  06/05/21 (!) 52  04/23/21 70   Wt Readings from Last 3 Encounters:  06/27/21 197 lb 6.4 oz (89.5 kg)  06/05/21 200 lb 8 oz (90.9 kg)  04/23/21 201 lb 9.6 oz (91.4 kg)  Assessment: Review of patient past medical history, allergies, medications, health status, including review of consultants reports, laboratory and other test data, was performed as part of comprehensive evaluation and provision of chronic care management services.   SDOH:  (Social Determinants of Health) assessments and interventions performed:  SDOH Interventions    Flowsheet Row Most Recent Value  SDOH Interventions   Financial Strain Interventions Other (Comment)  [cost of Vascepa high - applied for [employer] and approved thru 03/2022]       CCM Care Plan  Allergies  Allergen Reactions   Crestor [Rosuvastatin] Other (See Comments)    myalgia   Niacin And Related Other (See Comments)    Whelps and skin flushed, mouth tingling    Medications Reviewed Today     Reviewed by [name], RPH-CPP (Pharmacist) on 08/14/21 at 1129  Med List Status: <None>   Medication Order Taking? Sig Documenting Provider Last Dose Status Informant  amLODipine (NORVASC) 10 MG tablet [id number] Yes Take 1 tablet by mouth daily. Copland, Gay Filler, MD Taking Active   aspirin EC 81 MG EC tablet [id number] Yes Take 1 tablet (81 mg total) by mouth daily. Rod Can, MD Taking Active Self           Med Note Antony Contras, [place] B   Tue Feb 13, 2021  9:18 AM)    chlorpheniramine (CHLOR-TRIMETON) 4 MG tablet [id number] Yes Take 4 mg by mouth daily. [provider] Taking Active            Med Note Johnette Abraham Dionicio Stall   Tue Jun 05, 2021  8:31 AM) As needed.  cholecalciferol (VITAMIN D) 1000 UNITS tablet [id number] Yes Take 1,000 Units by mouth daily. [provider] Taking Active Self  clopidogrel (PLAVIX) 75 MG tablet  [id number] Yes TAKE 1 TABLET BY MOUTH DAILY. Pixie Casino, MD Taking Active   cyclobenzaprine (FLEXERIL) 10 MG tablet [id number] Yes TAKE 1 TABLET BY MOUTH TWICE DAILY AS NEEDED FOR MUSCLE SPASMS Copland, Gay Filler, MD Taking Active Self           Med Note Gentry Roch   Tue Dec 07, 2019  8:58 AM)    diazepam (VALIUM) 2 MG tablet [id number] Yes Take 1 tablet (2 mg total) by mouth every 8 (eight) hours as needed for muscle spasms. Copland, Gay Filler, MD Taking Active   ezetimibe (ZETIA) 10 MG tablet [id number] Yes TAKE 1 TABLET BY MOUTH ONCE A DAY Hilty, Nadean Corwin, MD Taking Active   famotidine (PEPCID) 10 MG tablet [id number] No Take 10 mg by mouth at bedtime.  Patient not taking: Reported on 08/14/2021   [provider] Not Taking Active   fenofibrate (TRICOR) 145 MG tablet [id number] Yes Take 1 tablet (145 mg total) by mouth daily. Pixie Casino, MD Taking Active Self  FLUoxetine (PROZAC) 20 MG capsule [id number] Yes TAKE 1 CAPSULE BY MOUTH ONCE DAILY Copland, Gay Filler, MD Taking Active   fluticasone (FLONASE) 50 MCG/ACT nasal spray [id number] Yes Place 1 spray into both nostrils daily as needed for allergies or rhinitis. Copland, Gay Filler, MD Taking Active   icosapent Ethyl (VASCEPA) 1 g capsule [id number] Yes TAKE 2 CAPSULES BY MOUTH TWICE DAILY Hilty, Nadean Corwin, MD Taking Active   isosorbide mononitrate (IMDUR) 30 MG 24 hr tablet [id number] Yes Take 1 tablet (30 mg total) by mouth daily. Pixie Casino, MD Taking Active   metFORMIN (GLUCOPHAGE-XR) 500 MG 24 hr tablet [id number] Yes Take 1 tablet  by  mouth 2  times daily. Copland, Gay Filler, MD Taking Active   metoprolol succinate (TOPROL-XL) 25 MG 24 hr tablet [id number] Yes TAKE 1/2 OF A TABLET BY MOUTH DAILY. Copland, Gay Filler, MD Taking Active   Multiple Vitamin (MULTIVITAMIN WITH MINERALS) TABS tablet [id number] Yes Take 1 tablet by mouth daily. [provider] Taking Active Self  nitroGLYCERIN (NITROSTAT) 0.4 MG SL  tablet [id number] Yes Place 1 tablet under the tongue every 5 minutes as needed for chest pain. Copland, Gay Filler, MD Taking Active   RABEprazole (ACIPHEX) 20 MG tablet [id number] Yes Take 1 tablet (20 mg total) by mouth daily 30 minutes prior to food  Patient taking differently: Take 20 mg by mouth. 4 times per week   Copland, Gay Filler, MD Taking Active     Discontinued 02/27/20 2121 (Side effect (s))   simvastatin (ZOCOR) 40 MG tablet [id number] Yes Take 1 tablet (40 mg total) by mouth daily. Copland, Gay Filler, MD Taking Active   sodium chloride flush (NS) 0.9 % injection 3 mL [id number]   Deberah Pelton, NP  Active   telmisartan (MICARDIS) 40 MG tablet [id number] Yes TAKE 1 TABLET BY MOUTH EVERY MORNING * NEEDS OFFICE VISIT Pixie Casino, MD Taking Active             Patient Active Problem List   Diagnosis Date Noted   Shortness of breath 01/30/2021   Heartburn 01/30/2021   Chronic rhinitis 01/30/2021   Upper airway cough syndrome 11/09/2020   DOE (dyspnea on exertion) 11/09/2020   AKI (acute kidney injury) ([place]) 10/30/2020   Obstructive sleep apnea treated with continuous positive airway pressure (CPAP) 05/07/2018   Chest pain 01/31/2018   Morbid obesity ([place]) 11/03/2017   Coronary artery disease involving native coronary artery of native heart with unstable angina pectoris ([place]) 11/03/2017   Insomnia 11/03/2017   Inadequate sleep hygiene 11/03/2017   Anxiety 07/21/2017   Dizziness 10/02/2016   Right patella fracture 08/29/2016   Acquired foot deformity, left 07/18/2016   Osteopenia 11/10/2015   HNP (herniated nucleus pulposus), lumbar 10/12/2014   Spinal stenosis at L4-L5 level 10/12/2014   Iron deficiency anemia 07/29/2014   DM (diabetes mellitus) with complications ([place]) 64/38/3818   Environmental and seasonal allergies 04/28/2014   Spinal stenosis, lumbar region, with neurogenic claudication 01/06/2013   Obesity, morbid, BMI 40.0-49.9 ([place]) 10/20/2012   Chest pain  with moderate risk of acute coronary syndrome 10/02/2012   CAD S/P percutaneous coronary angioplasty    Hyperlipidemia with target LDL less than 70    Essential hypertension 04/16/2012    Immunization History  Administered Date(s) Administered   Fluad Quad(high Dose 65+) 11/16/2018, 12/20/2019, 12/20/2020   Influenza Split 01/11/2016   Influenza,inj,Quad PF,6+ Mos 12/01/2013, 01/18/2015, 01/16/2018   Influenza,inj,quad, With Preservative 01/08/2017   Influenza-Unspecified 01/23/2018   PFIZER Comirnaty(Gray Top)Covid-19 Tri-Sucrose Vaccine 07/27/2020   PFIZER(Purple Top)SARS-COV-2 Vaccination 04/20/2019, 05/13/2019, 12/21/2019   PNEUMOCOCCAL CONJUGATE-20 12/21/2020   Pfizer Covid-19 Vaccine Bivalent Booster 74yr & up 12/20/2020   Pneumococcal Conjugate-13 01/18/2015   Pneumococcal Polysaccharide-23 07/12/2010, 04/21/2017   Td 11/16/2018   Tdap 09/15/2008   Zoster Recombinat (Shingrix) 01/16/2018, 03/23/2018    Conditions to be addressed/monitored: CAD, HTN, HLD, Hypertriglyceridemia, DMII, Anxiety, and obesity  Care Plan : General Pharmacy (Adult)  Updates made by ECherre Robins RPH-CPP since 08/14/2021 12:00 AM     Problem: CHL AMB "PATIENT-SPECIFIC PROBLEM"      Long-Range Goal: Pharmacy Care plan for chronic conditions and medication management  Start Date: 09/27/2020  This Visit's Progress: On track  Priority: High  Note:   Current Barriers:  Unable to achieve control of hyperlipidemia  Cost of therapy for mixed hyperlipidemia high (icosapent ethyl / Vascepa)   Pharmacist Clinical Goal(s):  Over the next 90 days, patient will verbalize ability to afford treatment regimen (icosapent ethyl / Vascepa)  achieve adherence to monitoring guidelines and medication adherence to achieve therapeutic efficacy achieve control of hyperlipidemia as evidenced by LDL <70 maintain control of HTN and type 2 DM as evidenced by BP <140/90 and A1c < 7.2%  through collaboration with PharmD  and provider.   Interventions: 1:1 collaboration with Copland, Gay Filler, MD regarding development and update of comprehensive plan of care as evidenced by provider attestation and co-signature Inter-disciplinary care team collaboration (see longitudinal plan of care) Comprehensive medication review performed; medication list updated in electronic medical record  Hypertension Controlled; BP goal <130/80 Current regimen:  Telmisartan 22m daily Metoprolol succinate 266m1/2 tab daily Amlodipine 1056maily  Home blood pressure readings 130's / 60's Denies dizziness or symptoms of hypotension Interventions: Continue to check blood pressure 3 to 4 times per week and record. Provide at future appointments Ensure daily salt intake < 2300 mg/day Continue to take above hypertensive medications  Hyperlipidemia / CAD:  Not at goals but improving;  LDL goal < 70 and Tg <150 Managed by Dr HilDebara Pickettcardio Current regimen:  Simvastatin 11m58mily  Vascepa 1g - take 2 capsules twice a day (patient is only able to tolerate 1 capsule twice a day) Ezetimibe 10mg86mly  Fenofibrate 145mg 56my (started 08/18/2020) Clopidogrel 75mg d60m  Past medications: rosuvastatin (myalgias); Niacin (flushing and skin rash) Patient reports cost of Vascepa is $100 per refill for 30 days.  Approved for [facility] funds thru 03/2022. Denied recent chest pain; No current myalgias.  Interventions: Continue current therapy for cholesterol lowering  Continue to follow up with Dr Hilty. Debara Pickettbetes Not at goal with recent increase in A1c;  A1c goal <7% Current regimen:  Metformin ER 500mg tw39mday (increase 06/2021) Patient has lost about 24 lbs in the last 15 months. Following low sugar diet and limiting serving sizes.  Interventions: Discussed importance of diet and exercise Discussed possible therapies for diabetes that might also help with weight loss - Ozempic, Trulicity,  Jardinace. (Previous appointment)  Recommend check blood sugar once daily, document, and provide at future appointments Contact provider with any episodes of hypoglycemia Continue current regimen for diabetes. Reviewed 2022 refill history of metformin. Adherence was low at beginning of 2022 but has improved. Will continue to monitor.    Depression/Anxiety Controlled Current treatment: Fluoxetine 20mg dai77mInterventions:  Continue fluoxetine  Osteopenia: Current treatment:  Prolia 60mg subc80meously every 6 months.  First Prolia dose was 03/2021. Cost of Prolia was $295 Previously took alendronate but stopped due to cough / reflux.  Last DEXA was 02/20/2021 DualFemur Neck Right 02/20/2021 = -2.0 (previous 02/02/2019 = -2.0 and 11/10/2015 = -1.8)     AP Spine L1-L2 02/20/2021 = -0.0 (previous 02/02/2019  = -1.3 and 11/10/2015 = -0.8)      Low bone mass and a 10 year probability of a hip fracture is 5.4% and probability of a major osteoporosis-related fracture 17.5% Interventions:  Continue Prolia every 6 months.  Currently no copay assistance funds available for Prolia / osteoporosis treatment but will continue to monitor. Discussed fall prevention  Medication management [facility]  Interventions Comprehensive medication review performed. Medication list updated to reflect any added or discontinued medications. Continue current medication management strategy Reviewed adherence / refill history. Discussed with patient.   Patient Goals/Self-Care Activities Over the next 90 days, patient will:  take medications as prescribed check blood pressure 3 to 4 times per week, document, and provide at future appointments Continue Prolia every 6 months    Follow Up Plan: Telephone follow up appointment with care management team member scheduled for:   July 2023 to review adherence.           Medication Assistance: Application for  hypercholesterolemia / [place]  medication assistance program. in process.  Anticipated assistance start date 05/23/2021.  See plan of care for additional detail.  Patient's preferred pharmacy is:  [facility] [place] [address] Phone: [phone] Fax: [facility] [address], [place] [address] Phone: [phone] Fax: [facility] [address] [place] [address] Phone: [phone] Fax: [phone]   Follow Up:  May 2023 - phone visit with clinical pharmacist; March 2023 follow up with PCP.   [name], PharmD Clinical Pharmacist [facility] [phone]

## 2021-08-14 NOTE — Patient Instructions (Signed)
[name] It was a pleasure speaking with you again today. Below is a summary of your health goals and care plan  If you have any questions or concerns, please feel free to contact me either at the phone number below or with a MyChart message.   Keep up the good work!  [name], PharmD Clinical Pharmacist [facility] [phone] (direct line)  [phone] (main office number)  Chronic Care Management CARE PLAN   Hypertension BP Readings from Last 3 Encounters:  06/27/21 127/70  06/05/21 138/60  04/23/21 124/80   Pharmacist Clinical Goal(s): Over the next 90 days, patient will work with PharmD and providers to achieve BP goal <130/80 Current regimen:  Telmisartan 40mg  daily Metoprolol succinate 25mg  1/2 tab daily Amlodipine 10mg  daily  Interventions: Requested patient to check blood pressure 3 to 4  times per week and record Coordinated updated prescription for amlodipine for 90 day supply at patient's pharmacy Patient self care activities - Over the next 90 days, patient will: Check blood pressure 3 to 4 times per week, document, and provide at future appointments Ensure daily salt intake < 2300 mg/day  Hyperlipidemia Lipid Panel     Component Value Date/Time   CHOL 143 05/31/2021 0912   TRIG 124 05/31/2021 0912   HDL 37 (L) 05/31/2021 0912   CHOLHDL 3.9 05/31/2021 0912   CHOLHDL 6.3 (H) 12/20/2019 0945   VLDL 30.8 07/01/2019 0921   LDLCALC 84 05/31/2021 0912   LDLCALC  12/20/2019 0945     Comment:     . LDL cholesterol not calculated. Triglyceride levels greater than 400 mg/dL invalidate calculated LDL results. . Reference range: <100 . Desirable range <100 mg/dL for primary prevention;   <70 mg/dL for patients with CHD or diabetic patients  with > or = 2 CHD risk factors. Marland Kitchen LDL-C is now calculated using the Martin-Hopkins  calculation, which is a validated novel method providing  better accuracy than the Friedewald  equation in the  estimation of LDL-C.  Cresenciano Genre et al. Annamaria Helling. MU:[id number]): 2061-2068  (http://education.[employer].com/faq/FAQ164)    LDLDIRECT 82.0 11/16/2018 1010   LABVLDL 22 05/31/2021 0912    Pharmacist Clinical Goal(s): Over the next 90 days, patient will work with PharmD and providers to achieve LDL goal < 70 and Tg <150 Current regimen:  Simvastatin 40mg  daily  Vascepa 1g - take 2 capsules twice a day Ezetimibe 10mg  daily  Fenofibrate 145mg  daily Interventions: Encouraged adherence to lipid lowering therapy.  Continue lipid lowering medications listed above.  Continue to follow up with Dr Debara Pickett.   Diabetes Lab Results  Component Value Date/Time   HGBA1C 7.9 (H) 06/27/2021 08:37 AM   HGBA1C 7.5 (H) 04/23/2021 02:14 PM   Pharmacist Clinical Goal(s): Over the next 90 days, patient will work with PharmD and providers to achieve A1c goal <7% Current regimen:  Metformin ER 500mg  TWICE a DAY Interventions: Discussed importance of diet and exercise Discussed possible therapies for diabetes that might also help with weight loss - Ozempic, Trulicity, Jardinace. Check blood sugar once daily, document, and provide at future appointments Contact provider with any episodes of hypoglycemia Continue current regimen for diabetes.   Depression/Anxiety Current treatment: Fluoxetine 20mg  daily  Interventions:    Continue fluoxetine at current dose.  Osteopenia: Current regimen:  Prolia 60mg  - every 6 months Last DEXA was 02/20/2021 Interventions:  Continue Prolia every 6 months Discussed fall prevention  Medication management Pharmacist Clinical Goal(s): Over the next 180 days, patient will  work with PharmD and providers to achieve optimal medication adherence Current pharmacy: [facility] Interventions Comprehensive medication review performed. Continue current medication management strategy Patient self care activities - Over the next 180  days, patient will: Focus on medication adherence by filling and taking medications appropriately  Take medications as prescribed Report any questions or concerns to PharmD and/or provider(s)  Patient Goals/Self-Care Activities take medications as prescribed check blood pressure 3 to 4 times per week, document, and provide at future appointments Continue Prolia   Patient verbalizes understanding of instructions and care plan provided today and agrees to view in [place]. Active MyChart status and patient understanding of how to access instructions and care plan via MyChart confirmed with patient.

## 2021-08-15 ENCOUNTER — Other Ambulatory Visit (HOSPITAL_COMMUNITY): Payer: Self-pay

## 2021-08-16 ENCOUNTER — Ambulatory Visit: Payer: PPO | Admitting: Family Medicine

## 2021-08-16 ENCOUNTER — Ambulatory Visit (INDEPENDENT_AMBULATORY_CARE_PROVIDER_SITE_OTHER): Payer: PPO | Admitting: Family

## 2021-08-16 ENCOUNTER — Ambulatory Visit: Payer: Self-pay

## 2021-08-16 ENCOUNTER — Encounter: Payer: Self-pay | Admitting: Family Medicine

## 2021-08-16 ENCOUNTER — Other Ambulatory Visit (HOSPITAL_BASED_OUTPATIENT_CLINIC_OR_DEPARTMENT_OTHER): Payer: Self-pay

## 2021-08-16 VITALS — BP 166/55 | HR 59 | Temp 98.0°F | Resp 16 | Wt 198.0 lb

## 2021-08-16 VITALS — BP 166/55 | Ht 61.5 in | Wt 198.0 lb

## 2021-08-16 DIAGNOSIS — M62838 Other muscle spasm: Secondary | ICD-10-CM

## 2021-08-16 DIAGNOSIS — M5412 Radiculopathy, cervical region: Secondary | ICD-10-CM

## 2021-08-16 DIAGNOSIS — M542 Cervicalgia: Secondary | ICD-10-CM

## 2021-08-16 MED ORDER — HYDROCODONE-ACETAMINOPHEN 5-325 MG PO TABS
1.0000 | ORAL_TABLET | Freq: Three times a day (TID) | ORAL | 0 refills | Status: DC | PRN
  Filled 2021-08-16: qty 10, 4d supply, fill #0

## 2021-08-16 MED ORDER — METHYLPREDNISOLONE ACETATE 40 MG/ML IJ SUSP
40.0000 mg | Freq: Once | INTRAMUSCULAR | Status: AC
  Administered 2021-08-16: 40 mg via INTRA_ARTICULAR

## 2021-08-16 NOTE — Progress Notes (Signed)
[name] - 71 y.o. female MRN [id number]  Date of birth: 1950/05/15  SUBJECTIVE:  Including CC & ROS.  No chief complaint on file.   [name] is a 71 y.o. female that is presenting with left trapezius pain as well as pain that goes down the left arm.  Symptoms been occurring for the past week.  Severe in nature and she has had limited sleep.  Denies any specific inciting event.  No history of neck surgery.   Review of Systems See HPI   HISTORY: Past Medical, Surgical, Social, and Family History Reviewed & Updated per EMR.   Pertinent Historical Findings include:  Past Medical History:  Diagnosis Date   Anemia    Arthritis    Bronchitis    hx of    CAD (coronary artery disease)    a. s/p DES to LAD in 2010 with patent stent by cath in 2014 b. low-risk NST in 11/2016   Cataracts, bilateral    Diabetes mellitus without complication (HCC)    Family history of adverse reaction to anesthesia    pts mother had difficulty awakening    GERD (gastroesophageal reflux disease)    Hyperlipidemia LDL goal < 70    Hypertension    Lumbar back pain    Numbness    left leg and foot    Plantar fascia rupture    Left Foot   Sleep apnea    uses CPAP     Past Surgical History:  Procedure Laterality Date   ABDOMINAL HYSTERECTOMY     BACK SURGERY     BREAST CYST ASPIRATION  1995   CAROTID STENT  2009   pt denies    CORONARY ANGIOPLASTY WITH STENT PLACEMENT  2010and 10-02-2012   Stent DES, Xience to prox. LAD   DOPPLER ECHOCARDIOGRAPHY  08/01/2009   EF=>55%,LV normal   LEFT HEART CATH AND CORONARY ANGIOGRAPHY N/A 12/10/2019   Procedure: LEFT HEART CATH AND CORONARY ANGIOGRAPHY;  Surgeon: Lyn Records, MD;  [facility]: [facility];  Service: Cardiovascular;  Laterality: N/A;   LEFT HEART CATH AND CORONARY ANGIOGRAPHY N/A 10/30/2020   Procedure: LEFT HEART CATH AND CORONARY ANGIOGRAPHY;  Surgeon: Runell Gess, MD;  Location: [facility];  Service: Cardiovascular;   Laterality: N/A;   LEFT HEART CATHETERIZATION WITH CORONARY ANGIOGRAM N/A 10/02/2012   Procedure: LEFT HEART CATHETERIZATION WITH CORONARY ANGIOGRAM;  Surgeon: Runell Gess, MD;  Location: [facility] CATH LAB;  Service: Cardiovascular;  Laterality: N/A;   lower arterial duplex  06/20/10   abi's normal,rgt 0.98,lft 1.06;bilateral PVRs normal   LUMBAR LAMINECTOMY/DECOMPRESSION MICRODISCECTOMY Left 01/06/2013   Procedure: MICRO LUMBAR DECOMPRESSION L4-5 AND L5-S1;  Surgeon: Javier Docker, MD;  Location: [facility];  Service: Orthopedics;  Laterality: Left;   LUMBAR LAMINECTOMY/DECOMPRESSION MICRODISCECTOMY Left 10/12/2014   Procedure: REVISION MICRO LUMBAR/DECOMPRESSION L4-5 LEFT ;  Surgeon: Jene Every, MD;  Location: [facility];  Service: Orthopedics;  Laterality: Left;   NM MYOCAR PERF WALL MOTION  09/22/2008   lexiscan-EF 83%; glogal LV systolic fx is norm. ,evidence of mild ischemia basal anterior,midanterior and apical lateral region(s).    ORIF PATELLA Right 08/29/2016   Procedure: OPEN REDUCTION INTERNAL (ORIF) FIXATION RIGHT PATELLA;  Surgeon: Samson Frederic, MD;  Location: [facility];  Service: Orthopedics;  Laterality: Right;  Adductor Block   TUBAL LIGATION     TYMPANOPLASTY Bilateral    UVULOPALATOPHARYNGOPLASTY     pt denies      PHYSICAL EXAM:  VS: BP (!) 166/55   Ht 5' 1.5" (1.562 m)   Wt 198 lb (89.8 kg)   LMP  (LMP Unknown)   BMI 36.81 kg/m  Physical Exam Gen: NAD, alert, cooperative with exam, well-appearing MSK:  Neurovascularly intact    Limited ultrasound: Left neck:  No changes specifically appreciated within the musculature in the trapezius or the levator scapula  Summary: No structural changes appreciated  Ultrasound and interpretation by Clare Gandy, MD   Aspiration/Injection Procedure Note [name] Dec 18, 1950  Procedure: Injection Indications: Myofascial trigger points  Procedure Details Consent: Risks of procedure as well as the alternatives and risks  of each were explained to the (patient/caregiver).  Consent for procedure obtained. Time Out: Verified patient identification, verified procedure, site/side was marked, verified correct patient position, special equipment/implants available, medications/allergies/relevent history reviewed, required imaging and test results available.  Performed.  The area was cleaned with iodine and alcohol swabs.    The left myofascial trigger points of the trapezius, rhomboids, levator scapula was injected using 1 cc's of 40 mg Depo-Medrol and 4 cc's of 0.5% bupivacaine with a 25 1 1/2" needle.  Ultrasound was used. Images were obtained in short views showing the injection.     A sterile dressing was applied.  Patient did tolerate procedure well.    ASSESSMENT & PLAN:   Cervical radiculopathy Acutely occurring.  Trigger points were appreciated in the neck and thoracic back.  Does seem to have more of a radicular in origin to her pain. -Counseled on home exercise therapy and supportive care. -Norco. -Trigger point injections. -Could consider imaging or physical therapy.

## 2021-08-16 NOTE — Progress Notes (Signed)
[name] is a 71 y.o. female with the following history as recorded in Epic[employer]:  Patient Active Problem List   Diagnosis Date Noted   Shortness of breath 01/30/2021   Heartburn 01/30/2021   Chronic rhinitis 01/30/2021   Upper airway cough syndrome 11/09/2020   DOE (dyspnea on exertion) 11/09/2020   AKI (acute kidney injury) (HCC) 10/30/2020   Obstructive sleep apnea treated with continuous positive airway pressure (CPAP) 05/07/2018   Chest pain 01/31/2018   Morbid obesity (HCC) 11/03/2017   Coronary artery disease involving native coronary artery of native heart with unstable angina pectoris (HCC) 11/03/2017   Insomnia 11/03/2017   Inadequate sleep hygiene 11/03/2017   Anxiety 07/21/2017   Dizziness 10/02/2016   Right patella fracture 08/29/2016   Acquired foot deformity, left 07/18/2016   Osteopenia 11/10/2015   HNP (herniated nucleus pulposus), lumbar 10/12/2014   Spinal stenosis at L4-L5 level 10/12/2014   Iron deficiency anemia 07/29/2014   DM (diabetes mellitus) with complications (HCC) 07/29/2014   Environmental and seasonal allergies 04/28/2014   Spinal stenosis, lumbar region, with neurogenic claudication 01/06/2013   Obesity, morbid, BMI 40.0-49.9 (HCC) 10/20/2012   Chest pain with moderate risk of acute coronary syndrome 10/02/2012   CAD S/P percutaneous coronary angioplasty    Hyperlipidemia with target LDL less than 70    Essential hypertension 04/16/2012    Current Outpatient Medications  Medication Sig Dispense Refill   amLODipine (NORVASC) 10 MG tablet Take 1 tablet by mouth daily. 90 tablet 1   aspirin EC 81 MG EC tablet Take 1 tablet (81 mg total) by mouth daily. 30 tablet 1   chlorpheniramine (CHLOR-TRIMETON) 4 MG tablet Take 4 mg by mouth daily.     cholecalciferol (VITAMIN D) 1000 UNITS tablet Take 1,000 Units by mouth daily.     clopidogrel (PLAVIX) 75 MG tablet TAKE 1 TABLET BY MOUTH DAILY. 90 tablet 3   cyclobenzaprine (FLEXERIL) 10 MG tablet TAKE  1 TABLET BY MOUTH TWICE DAILY AS NEEDED FOR MUSCLE SPASMS 30 tablet 2   diazepam (VALIUM) 2 MG tablet Take 1 tablet (2 mg total) by mouth every 8 (eight) hours as needed for muscle spasms. 30 tablet 2   ezetimibe (ZETIA) 10 MG tablet TAKE 1 TABLET BY MOUTH ONCE A DAY 90 tablet 3   famotidine (PEPCID) 10 MG tablet Take 10 mg by mouth at bedtime.     fenofibrate (TRICOR) 145 MG tablet Take 1 tablet (145 mg total) by mouth daily. 90 tablet 3   FLUoxetine (PROZAC) 20 MG capsule TAKE 1 CAPSULE BY MOUTH ONCE DAILY 90 capsule 3   fluticasone (FLONASE) 50 MCG/ACT nasal spray Place 1 spray into both nostrils daily as needed for allergies or rhinitis. 16 g 11   icosapent Ethyl (VASCEPA) 1 g capsule TAKE 2 CAPSULES BY MOUTH TWICE DAILY 360 capsule 3   isosorbide mononitrate (IMDUR) 30 MG 24 hr tablet Take 1 tablet (30 mg total) by mouth daily. 90 tablet 2   metFORMIN (GLUCOPHAGE-XR) 500 MG 24 hr tablet Take 1 tablet  by mouth 2  times daily. 180 tablet 3   metoprolol succinate (TOPROL-XL) 25 MG 24 hr tablet TAKE 1/2 OF A TABLET BY MOUTH DAILY. 45 tablet 1   Multiple Vitamin (MULTIVITAMIN WITH MINERALS) TABS tablet Take 1 tablet by mouth daily.     nitroGLYCERIN (NITROSTAT) 0.4 MG SL tablet Place 1 tablet under the tongue every 5 minutes as needed for chest pain. 25 tablet 2   RABEprazole (ACIPHEX) 20 MG  tablet Take 1 tablet (20 mg total) by mouth daily 30 minutes prior to food (Patient taking differently: Take 20 mg by mouth. 4 times per week) 90 tablet 3   simvastatin (ZOCOR) 40 MG tablet Take 1 tablet (40 mg total) by mouth daily. 90 tablet 3   telmisartan (MICARDIS) 40 MG tablet TAKE 1 TABLET BY MOUTH EVERY MORNING * NEEDS OFFICE VISIT 90 tablet 2   No current facility-administered medications for this visit.    Allergies: Crestor [rosuvastatin] and Niacin and related  Past Medical History:  Diagnosis Date   Anemia    Arthritis    Bronchitis    hx of    CAD (coronary artery disease)    a. s/p DES  to LAD in 2010 with patent stent by cath in 2014 b. low-risk NST in 11/2016   Cataracts, bilateral    Diabetes mellitus without complication (HCC)    Family history of adverse reaction to anesthesia    pts mother had difficulty awakening    GERD (gastroesophageal reflux disease)    Hyperlipidemia LDL goal < 70    Hypertension    Lumbar back pain    Numbness    left leg and foot    Plantar fascia rupture    Left Foot   Sleep apnea    uses CPAP     Past Surgical History:  Procedure Laterality Date   ABDOMINAL HYSTERECTOMY     BACK SURGERY     BREAST CYST ASPIRATION  1995   CAROTID STENT  2009   pt denies    CORONARY ANGIOPLASTY WITH STENT PLACEMENT  2010and 10-02-2012   Stent DES, Xience to prox. LAD   DOPPLER ECHOCARDIOGRAPHY  08/01/2009   EF=>55%,LV normal   LEFT HEART CATH AND CORONARY ANGIOGRAPHY N/A 12/10/2019   Procedure: LEFT HEART CATH AND CORONARY ANGIOGRAPHY;  Surgeon: Lyn RecordsSmith, Henry W, MD;  [facility]: [facility];  Service: Cardiovascular;  Laterality: N/A;   LEFT HEART CATH AND CORONARY ANGIOGRAPHY N/A 10/30/2020   Procedure: LEFT HEART CATH AND CORONARY ANGIOGRAPHY;  Surgeon: Runell GessBerry, Jonathan J, MD;  Location: [facility];  Service: Cardiovascular;  Laterality: N/A;   LEFT HEART CATHETERIZATION WITH CORONARY ANGIOGRAM N/A 10/02/2012   Procedure: LEFT HEART CATHETERIZATION WITH CORONARY ANGIOGRAM;  Surgeon: Runell GessJonathan J Berry, MD;  [facility];  Service: Cardiovascular;  Laterality: N/A;   lower arterial duplex  06/20/10   abi's normal,rgt 0.98,lft 1.06;bilateral PVRs normal   LUMBAR LAMINECTOMY/DECOMPRESSION MICRODISCECTOMY Left 01/06/2013   Procedure: MICRO LUMBAR DECOMPRESSION L4-5 AND L5-S1;  Surgeon: Javier DockerJeffrey C Beane, MD;  Location: [facility];  Service: Orthopedics;  Laterality: Left;   LUMBAR LAMINECTOMY/DECOMPRESSION MICRODISCECTOMY Left 10/12/2014   Procedure: REVISION MICRO LUMBAR/DECOMPRESSION L4-5 LEFT ;  Surgeon: Jene EveryJeffrey Beane, MD;  Location: [facility];  Service: Orthopedics;  Laterality: Left;   NM MYOCAR PERF WALL MOTION  09/22/2008   lexiscan-EF 83%; glogal LV systolic fx is norm. ,evidence of mild ischemia basal anterior,midanterior and apical lateral region(s).    ORIF PATELLA Right 08/29/2016   Procedure: OPEN REDUCTION INTERNAL (ORIF) FIXATION RIGHT PATELLA;  Surgeon: Samson FredericSwinteck, Brian, MD;  Location: [facility];  Service: Orthopedics;  Laterality: Right;  Adductor Block   TUBAL LIGATION     TYMPANOPLASTY Bilateral    UVULOPALATOPHARYNGOPLASTY     pt denies     Family History  Problem Relation Age of Onset   Coronary artery disease Mother    Rheum arthritis Mother    Dementia Mother  Heart attack Father    Hypertension Father    Hyperlipidemia Father    Other Father        MVA   Hypertension Brother    Cancer Brother    Heart disease Brother    Cancer Paternal Grandmother        stomach   Diabetes Paternal Grandfather    Breast cancer Neg Hx     Social History   Tobacco Use   Smoking status: Never   Smokeless tobacco: Never  Substance Use Topics   Alcohol use: Yes    Comment: occasional, 1 a month wine    Subjective:   Left shoulder pain x 6 days; woke up last Friday morning with onset of symptoms; symptoms most noticeable with movement; has taken Aleve and Advil and Tylenol with limited relief; feels numbness/ tingling radiating down into left elbow;      Objective:  Vitals:   08/16/21 0822  BP: (!) 166/55  Pulse: (!) 59  Resp: 16  Temp: 98 F (36.7 C)  TempSrc: Oral  SpO2: 98%  Weight: 198 lb (89.8 kg)    General: Well developed, well nourished, in no acute distress  Skin : Warm and dry.  Head: Normocephalic and atraumatic  Lungs: Respirations unlabored;  Musculoskeletal: No deformities; no active joint inflammation; muscle spasm noted over left trapeziums muscle Extremities: No edema, cyanosis, clubbing  Vessels: Symmetric bilaterally  Neurologic: Alert and oriented; speech intact; face  symmetrical; moves all extremities well; CNII-XII intact without focal deficit   Assessment:  1. Neck pain on left side   2. Muscle spasm     Plan:  Due to nature of symptoms, will have patient go ahead and see sports medicine today for possible trapezius injection; she will apply heat and rest and follow up as needed.   No follow-ups on file.  No orders of the defined types were placed in this encounter.   Requested Prescriptions    No prescriptions requested or ordered in this encounter

## 2021-08-16 NOTE — Assessment & Plan Note (Signed)
Acutely occurring.  Trigger points were appreciated in the neck and thoracic back.  Does seem to have more of a radicular in origin to her pain. -Counseled on home exercise therapy and supportive care. -Norco. -Trigger point injections. -Could consider imaging or physical therapy.

## 2021-08-16 NOTE — Patient Instructions (Signed)
Nice to meet you Please try heat  Please try the exercises  Please use the pain medicine as needed at night  Please send me a message in MyChart with any questions or updates.  Please see me back in 2-3 weeks.   --Dr. Jordan Likes

## 2021-08-17 ENCOUNTER — Other Ambulatory Visit (HOSPITAL_COMMUNITY): Payer: Self-pay

## 2021-08-22 ENCOUNTER — Ambulatory Visit: Payer: PPO | Admitting: Family Medicine

## 2021-08-22 DIAGNOSIS — I2510 Atherosclerotic heart disease of native coronary artery without angina pectoris: Secondary | ICD-10-CM

## 2021-08-22 DIAGNOSIS — E1159 Type 2 diabetes mellitus with other circulatory complications: Secondary | ICD-10-CM | POA: Diagnosis not present

## 2021-08-22 DIAGNOSIS — E785 Hyperlipidemia, unspecified: Secondary | ICD-10-CM | POA: Diagnosis not present

## 2021-08-22 DIAGNOSIS — Z7984 Long term (current) use of oral hypoglycemic drugs: Secondary | ICD-10-CM | POA: Diagnosis not present

## 2021-08-22 DIAGNOSIS — F419 Anxiety disorder, unspecified: Secondary | ICD-10-CM

## 2021-08-22 DIAGNOSIS — M8580 Other specified disorders of bone density and structure, unspecified site: Secondary | ICD-10-CM

## 2021-08-22 DIAGNOSIS — I10 Essential (primary) hypertension: Secondary | ICD-10-CM

## 2021-08-25 NOTE — Telephone Encounter (Signed)
Prolia VOB initiated via [email or web address]  Last OV:  Next OV:  Last Prolia inj 04/23/21 Next Prolia inj due 10/22/21

## 2021-08-30 ENCOUNTER — Ambulatory Visit: Payer: Self-pay

## 2021-08-30 ENCOUNTER — Ambulatory Visit: Payer: PPO | Admitting: Family Medicine

## 2021-08-30 ENCOUNTER — Encounter: Payer: Self-pay | Admitting: Family Medicine

## 2021-08-30 VITALS — BP 132/70 | Ht 61.5 in | Wt 198.0 lb

## 2021-08-30 DIAGNOSIS — M1711 Unilateral primary osteoarthritis, right knee: Secondary | ICD-10-CM | POA: Diagnosis not present

## 2021-08-30 DIAGNOSIS — M5412 Radiculopathy, cervical region: Secondary | ICD-10-CM

## 2021-08-30 DIAGNOSIS — M25561 Pain in right knee: Secondary | ICD-10-CM

## 2021-08-30 NOTE — Assessment & Plan Note (Signed)
Significantly improved with the injection.  Only having mild symptoms intermittently. -Counseled on home exercise therapy and supportive care. -Could consider physical therapy or further imaging.

## 2021-08-30 NOTE — Patient Instructions (Signed)
Good to see you Please try heat on the neck and ice on the knee  Please try the exercises  You can try voltaren over the counter for the knee   Please send me a message in MyChart with any questions or updates.  Please see me back to have orthotics made.   --Dr. Jordan Likes

## 2021-08-30 NOTE — Assessment & Plan Note (Signed)
History of fracture of the patella from years ago.  Having degenerative changes appreciated the medial compartment. -Counseled on home exercise therapy and supportive care. -Could consider physical therapy. -Can make some new orthotics.

## 2021-08-30 NOTE — Progress Notes (Signed)
[name] - 71 y.o. female MRN [id number]  Date of birth: 26-Aug-1950  SUBJECTIVE:  Including CC & ROS.  No chief complaint on file.   [name] is a 71 y.o. female that is following up for her radicular pain and presenting with acute on chronic right knee pain.  The radicular pain has improved with the trigger point injections.  She reports having a history of patella fracture from 2018.  She has stiffness with the knee in the mornings.  No recent injury inciting event.   Review of Systems See HPI   HISTORY: Past Medical, Surgical, Social, and Family History Reviewed & Updated per EMR.   Pertinent Historical Findings include:  Past Medical History:  Diagnosis Date   Anemia    Arthritis    Bronchitis    hx of    CAD (coronary artery disease)    a. s/p DES to LAD in 2010 with patent stent by cath in 2014 b. low-risk NST in 11/2016   Cataracts, bilateral    Diabetes mellitus without complication (HCC)    Family history of adverse reaction to anesthesia    pts mother had difficulty awakening    GERD (gastroesophageal reflux disease)    Hyperlipidemia LDL goal < 70    Hypertension    Lumbar back pain    Numbness    left leg and foot    Plantar fascia rupture    Left Foot   Sleep apnea    uses CPAP     Past Surgical History:  Procedure Laterality Date   ABDOMINAL HYSTERECTOMY     BACK SURGERY     BREAST CYST ASPIRATION  1995   CAROTID STENT  2009   pt denies    CORONARY ANGIOPLASTY WITH STENT PLACEMENT  2010and 10-02-2012   Stent DES, Xience to prox. LAD   DOPPLER ECHOCARDIOGRAPHY  08/01/2009   EF=>55%,LV normal   LEFT HEART CATH AND CORONARY ANGIOGRAPHY N/A 12/10/2019   Procedure: LEFT HEART CATH AND CORONARY ANGIOGRAPHY;  Surgeon: Lyn Records, MD;  [facility];  Service: Cardiovascular;  Laterality: N/A;   LEFT HEART CATH AND CORONARY ANGIOGRAPHY N/A 10/30/2020   Procedure: LEFT HEART CATH AND CORONARY ANGIOGRAPHY;  Surgeon: Runell Gess,  MD;  [facility]: [facility];  Service: Cardiovascular;  Laterality: N/A;   LEFT HEART CATHETERIZATION WITH CORONARY ANGIOGRAM N/A 10/02/2012   Procedure: LEFT HEART CATHETERIZATION WITH CORONARY ANGIOGRAM;  Surgeon: Runell Gess, MD;  Location[facility];  Service: Cardiovascular;  Laterality: N/A;   lower arterial duplex  06/20/10   abi's normal,rgt 0.98,lft 1.06;bilateral PVRs normal   LUMBAR LAMINECTOMY/DECOMPRESSION MICRODISCECTOMY Left 01/06/2013   Procedure: MICRO LUMBAR DECOMPRESSION L4-5 AND L5-S1;  Surgeon: Javier Docker, MD;  Location: [facility];  Service: Orthopedics;  Laterality: Left;   LUMBAR LAMINECTOMY/DECOMPRESSION MICRODISCECTOMY Left 10/12/2014   Procedure: REVISION MICRO LUMBAR/DECOMPRESSION L4-5 LEFT ;  Surgeon: Jene Every, MD;  Location: [facility];  Service: Orthopedics;  Laterality: Left;   NM MYOCAR PERF WALL MOTION  09/22/2008   lexiscan-EF 83%; glogal LV systolic fx is norm. ,evidence of mild ischemia basal anterior,midanterior and apical lateral region(s).    ORIF PATELLA Right 08/29/2016   Procedure: OPEN REDUCTION INTERNAL (ORIF) FIXATION RIGHT PATELLA;  Surgeon: Samson Frederic, MD;  Location: [facility];  Service: Orthopedics;  Laterality: Right;  Adductor Block   TUBAL LIGATION     TYMPANOPLASTY Bilateral    UVULOPALATOPHARYNGOPLASTY     pt denies  PHYSICAL EXAM:  VS: BP 132/70 (BP Location: Left Arm, Patient Position: Sitting)   Ht 5' 1.5" (1.562 m)   Wt 198 lb (89.8 kg)   LMP  (LMP Unknown)   BMI 36.81 kg/m  Physical Exam Gen: NAD, alert, cooperative with exam, well-appearing MSK:  Neurovascularly intact    Limited ultrasound: Right knee:  Trace effusion. Mild spurring along the superior pole of the patella. Mild spurring along the inferior pole of patella. Medial joint space narrowing with outpouching of the medial meniscus. Normal-appearing lateral meniscus  Summary: Degenerative changes appreciated in the medial  compartment  Ultrasound and interpretation by Clare Gandy, MD    ASSESSMENT & PLAN:   Cervical radiculopathy Significantly improved with the injection.  Only having mild symptoms intermittently. -Counseled on home exercise therapy and supportive care. -Could consider physical therapy or further imaging.  Primary osteoarthritis of right knee History of fracture of the patella from years ago.  Having degenerative changes appreciated the medial compartment. -Counseled on home exercise therapy and supportive care. -Could consider physical therapy. -Can make some new orthotics.

## 2021-09-03 ENCOUNTER — Emergency Department (HOSPITAL_COMMUNITY)
Admission: EM | Admit: 2021-09-03 | Discharge: 2021-09-03 | Disposition: A | Discharge status: Home / Self Care | Payer: PPO | Attending: Emergency Medicine | Admitting: Emergency Medicine

## 2021-09-03 ENCOUNTER — Emergency Department (HOSPITAL_COMMUNITY): Payer: PPO

## 2021-09-03 ENCOUNTER — Other Ambulatory Visit: Payer: Self-pay

## 2021-09-03 ENCOUNTER — Encounter: Payer: Self-pay | Admitting: Family Medicine

## 2021-09-03 DIAGNOSIS — I959 Hypotension, unspecified: Secondary | ICD-10-CM | POA: Diagnosis not present

## 2021-09-03 DIAGNOSIS — R2990 Unspecified symptoms and signs involving the nervous system: Secondary | ICD-10-CM | POA: Diagnosis not present

## 2021-09-03 DIAGNOSIS — Z7982 Long term (current) use of aspirin: Secondary | ICD-10-CM | POA: Diagnosis not present

## 2021-09-03 DIAGNOSIS — E119 Type 2 diabetes mellitus without complications: Secondary | ICD-10-CM | POA: Insufficient documentation

## 2021-09-03 DIAGNOSIS — I10 Essential (primary) hypertension: Secondary | ICD-10-CM | POA: Diagnosis not present

## 2021-09-03 DIAGNOSIS — R200 Anesthesia of skin: Secondary | ICD-10-CM | POA: Insufficient documentation

## 2021-09-03 DIAGNOSIS — G4489 Other headache syndrome: Secondary | ICD-10-CM | POA: Diagnosis not present

## 2021-09-03 DIAGNOSIS — I672 Cerebral atherosclerosis: Secondary | ICD-10-CM | POA: Diagnosis not present

## 2021-09-03 DIAGNOSIS — R42 Dizziness and giddiness: Secondary | ICD-10-CM | POA: Diagnosis not present

## 2021-09-03 DIAGNOSIS — Z79899 Other long term (current) drug therapy: Secondary | ICD-10-CM | POA: Insufficient documentation

## 2021-09-03 DIAGNOSIS — I4891 Unspecified atrial fibrillation: Secondary | ICD-10-CM | POA: Diagnosis not present

## 2021-09-03 DIAGNOSIS — R791 Abnormal coagulation profile: Secondary | ICD-10-CM | POA: Insufficient documentation

## 2021-09-03 DIAGNOSIS — Z7984 Long term (current) use of oral hypoglycemic drugs: Secondary | ICD-10-CM | POA: Diagnosis not present

## 2021-09-03 DIAGNOSIS — R531 Weakness: Secondary | ICD-10-CM | POA: Diagnosis not present

## 2021-09-03 DIAGNOSIS — I2510 Atherosclerotic heart disease of native coronary artery without angina pectoris: Secondary | ICD-10-CM | POA: Diagnosis not present

## 2021-09-03 DIAGNOSIS — D72829 Elevated white blood cell count, unspecified: Secondary | ICD-10-CM | POA: Insufficient documentation

## 2021-09-03 DIAGNOSIS — M542 Cervicalgia: Secondary | ICD-10-CM

## 2021-09-03 DIAGNOSIS — I639 Cerebral infarction, unspecified: Secondary | ICD-10-CM | POA: Diagnosis not present

## 2021-09-03 DIAGNOSIS — R4701 Aphasia: Secondary | ICD-10-CM | POA: Diagnosis not present

## 2021-09-03 DIAGNOSIS — R29818 Other symptoms and signs involving the nervous system: Secondary | ICD-10-CM | POA: Diagnosis not present

## 2021-09-03 LAB — COMPREHENSIVE METABOLIC PANEL
ALT: 25 U/L (ref 0–44)
AST: 25 U/L (ref 15–41)
Albumin: 4 g/dL (ref 3.5–5.0)
Alkaline Phosphatase: 46 U/L (ref 38–126)
Anion gap: 11 (ref 5–15)
BUN: 18 mg/dL (ref 8–23)
CO2: 22 mmol/L (ref 22–32)
Calcium: 9.9 mg/dL (ref 8.9–10.3)
Chloride: 106 mmol/L (ref 98–111)
Creatinine, Ser: 1.23 mg/dL — ABNORMAL HIGH (ref 0.44–1.00)
GFR, Estimated: 47 mL/min — ABNORMAL LOW (ref >60)
Glucose, Bld: 131 mg/dL — ABNORMAL HIGH (ref 70–99)
Potassium: 3.9 mmol/L (ref 3.5–5.1)
Sodium: 139 mmol/L (ref 135–145)
Total Bilirubin: 0.5 mg/dL (ref 0.3–1.2)
Total Protein: 6.6 g/dL (ref 6.5–8.1)

## 2021-09-03 LAB — DIFFERENTIAL
Abs Immature Granulocytes: 0.04 10*3/uL (ref 0.00–0.07)
Basophils Absolute: 0.1 10*3/uL (ref 0.0–0.1)
Basophils Relative: 1 %
Eosinophils Absolute: 0.4 10*3/uL (ref 0.0–0.5)
Eosinophils Relative: 3 %
Immature Granulocytes: 0 %
Lymphocytes Relative: 47 %
Lymphs Abs: 6.9 10*3/uL — ABNORMAL HIGH (ref 0.7–4.0)
Monocytes Absolute: 0.9 10*3/uL (ref 0.1–1.0)
Monocytes Relative: 6 %
Neutro Abs: 6.3 10*3/uL (ref 1.7–7.7)
Neutrophils Relative %: 43 %

## 2021-09-03 LAB — I-STAT CHEM 8, ED
BUN: 23 mg/dL (ref 8–23)
Calcium, Ion: 1.19 mmol/L (ref 1.15–1.40)
Chloride: 105 mmol/L (ref 98–111)
Creatinine, Ser: 1.1 mg/dL — ABNORMAL HIGH (ref 0.44–1.00)
Glucose, Bld: 128 mg/dL — ABNORMAL HIGH (ref 70–99)
HCT: 39 % (ref 36.0–46.0)
Hemoglobin: 13.3 g/dL (ref 12.0–15.0)
Potassium: 4.2 mmol/L (ref 3.5–5.1)
Sodium: 140 mmol/L (ref 135–145)
TCO2: 24 mmol/L (ref 22–32)

## 2021-09-03 LAB — CBC
HCT: 37.4 % (ref 36.0–46.0)
Hemoglobin: 11.9 g/dL — ABNORMAL LOW (ref 12.0–15.0)
MCH: 26 pg (ref 26.0–34.0)
MCHC: 31.8 g/dL (ref 30.0–36.0)
MCV: 81.7 fL (ref 80.0–100.0)
Platelets: 376 10*3/uL (ref 150–400)
RBC: 4.58 MIL/uL (ref 3.87–5.11)
RDW: 15 % (ref 11.5–15.5)
WBC: 14.7 10*3/uL — ABNORMAL HIGH (ref 4.0–10.5)
nRBC: 0 % (ref 0.0–0.2)

## 2021-09-03 LAB — CBG MONITORING, ED: Glucose-Capillary: 131 mg/dL — ABNORMAL HIGH (ref 70–99)

## 2021-09-03 LAB — PROTIME-INR
INR: 1 (ref 0.8–1.2)
Prothrombin Time: 13.4 seconds (ref 11.4–15.2)

## 2021-09-03 LAB — APTT: aPTT: 25 seconds (ref 24–36)

## 2021-09-03 MED ORDER — SODIUM CHLORIDE 0.9% FLUSH
3.0000 mL | Freq: Once | INTRAVENOUS | Status: AC
  Administered 2021-09-03: 3 mL via INTRAVENOUS

## 2021-09-03 MED ORDER — LORAZEPAM 2 MG/ML IJ SOLN
1.0000 mg | Freq: Once | INTRAMUSCULAR | Status: AC | PRN
  Administered 2021-09-03: 0.5 mg via INTRAVENOUS
  Filled 2021-09-03: qty 1

## 2021-09-03 NOTE — ED Notes (Signed)
Patient returned from MRI.

## 2021-09-03 NOTE — ED Provider Notes (Signed)
[facility] Provider Note   CSN: [id number] Arrival date & time: 09/03/21  1344  An emergency department physician performed an initial assessment on this suspected stroke patient at 1345.  History  No chief complaint on file.   [name] is a 71 y.o. female.  Patient brought in by EMS.  Concerned as a code stroke.  Patient had been fine all morning.  And had an early lunch and then at 1230 while at [facility] started with left leg numbness and left arm shaking.  Blood sugar at the scene was 137.  Patient yesterday had some lightheadedness but had no other complaints.  Patient is never had anything happen like this before.  Patient seen by the stroke team upon arrival.  Patient went immediately to head CT and then to MRI before coming back to the room.  In addition stroke team stated that there seem to be some stuttering going on.  Past medical history significant for diabetes without complications hypertension hyperlipidemia coronary artery disease with stents.       Home Medications Prior to Admission medications   Medication Sig Start Date End Date Taking? Authorizing Provider  amLODipine (NORVASC) 10 MG tablet Take 1 tablet by mouth daily. 05/17/21  Yes Copland, Gay Filler, MD  aspirin EC 81 MG EC tablet Take 1 tablet (81 mg total) by mouth daily. 08/30/16  Yes Swinteck, Aaron Edelman, MD  chlorpheniramine (CHLOR-TRIMETON) 4 MG tablet Take 4 mg by mouth daily.   Yes [provider]  cholecalciferol (VITAMIN D) 1000 UNITS tablet Take 1,000 Units by mouth daily.   Yes [provider]  clopidogrel (PLAVIX) 75 MG tablet TAKE 1 TABLET BY MOUTH DAILY. Patient taking differently: Take 75 mg by mouth daily. 04/17/21 04/17/22 Yes Hilty, Nadean Corwin, MD  cyclobenzaprine (FLEXERIL) 10 MG tablet TAKE 1 TABLET BY MOUTH TWICE DAILY AS NEEDED FOR MUSCLE SPASMS Patient taking differently: Take 10 mg by mouth 2 (two) times daily as needed for muscle  spasms. 05/08/18  Yes Copland, Gay Filler, MD  denosumab (PROLIA) 60 MG/ML SOSY injection Inject 60 mg into the skin every 6 (six) months.   Yes [provider]  diazepam (VALIUM) 2 MG tablet Take 1 tablet (2 mg total) by mouth every 8 (eight) hours as needed for muscle spasms. 02/09/21  Yes Copland, Gay Filler, MD  ezetimibe (ZETIA) 10 MG tablet TAKE 1 TABLET BY MOUTH ONCE A DAY Patient taking differently: Take 10 mg by mouth daily. 03/21/21 03/21/22 Yes Hilty, Nadean Corwin, MD  fenofibrate (TRICOR) 145 MG tablet Take 1 tablet (145 mg total) by mouth daily. 08/18/20  Yes Hilty, Nadean Corwin, MD  FLUoxetine (PROZAC) 20 MG capsule TAKE 1 CAPSULE BY MOUTH ONCE DAILY Patient taking differently: Take 20 mg by mouth daily. 11/21/20 11/21/21 Yes Copland, Gay Filler, MD  fluticasone (FLONASE) 50 MCG/ACT nasal spray Place 1 spray into both nostrils daily as needed for allergies or rhinitis. 02/09/21  Yes Copland, Gay Filler, MD  HYDROcodone-acetaminophen (NORCO/VICODIN) 5-325 MG tablet Take 1 tablet by mouth every 8 (eight) hours as needed. 08/16/21  Yes Rosemarie Ax, MD  icosapent Ethyl (VASCEPA) 1 g capsule TAKE 2 CAPSULES BY MOUTH TWICE DAILY Patient taking differently: Take 2 g by mouth 2 (two) times daily. 08/10/21  Yes Hilty, Nadean Corwin, MD  isosorbide mononitrate (IMDUR) 30 MG 24 hr tablet Take 1 tablet (30 mg total) by mouth daily. 05/08/21  Yes Hilty, Nadean Corwin, MD  metFORMIN (GLUCOPHAGE-XR) 500 MG  24 hr tablet Take 1 tablet  by mouth 2  times daily. Patient taking differently: Take 500 mg by mouth daily with breakfast. 06/27/21  Yes Copland, Gay Filler, MD  metoprolol succinate (TOPROL-XL) 25 MG 24 hr tablet TAKE 1/2 OF A TABLET BY MOUTH DAILY. Patient taking differently: Take 12.5 mg by mouth daily. 08/13/21  Yes Copland, Gay Filler, MD  Multiple Vitamin (MULTIVITAMIN WITH MINERALS) TABS tablet Take 1 tablet by mouth daily.   Yes [provider]  nitroGLYCERIN (NITROSTAT) 0.4 MG SL tablet Place  1 tablet under the tongue every 5 minutes as needed for chest pain. 11/02/20  Yes Copland, Gay Filler, MD  RABEprazole (ACIPHEX) 20 MG tablet Take 1 tablet (20 mg total) by mouth daily 30 minutes prior to food 04/23/21  Yes Copland, Gay Filler, MD  simvastatin (ZOCOR) 40 MG tablet Take 1 tablet (40 mg total) by mouth daily. 07/03/21  Yes Copland, Gay Filler, MD  telmisartan (MICARDIS) 40 MG tablet TAKE 1 TABLET BY MOUTH EVERY MORNING * NEEDS OFFICE VISIT Patient taking differently: Take 40 mg by mouth daily. 04/17/21 04/17/22 Yes Hilty, Nadean Corwin, MD  rosuvastatin (CRESTOR) 20 MG tablet Take 1 tablet (20 mg total) by mouth daily. 12/29/19 02/27/20  Deberah Pelton, NP      Allergies    Crestor [rosuvastatin] and Niacin and related    Review of Systems   Review of Systems  Constitutional:  Negative for chills and fever.  HENT:  Negative for ear pain and sore throat.   Eyes:  Negative for pain and visual disturbance.  Respiratory:  Negative for cough and shortness of breath.   Cardiovascular:  Negative for chest pain and palpitations.  Gastrointestinal:  Negative for abdominal pain and vomiting.  Genitourinary:  Negative for dysuria and hematuria.  Musculoskeletal:  Negative for arthralgias and back pain.  Skin:  Negative for color change and rash.  Neurological:  Positive for speech difficulty, weakness and numbness. Negative for seizures and syncope.  All other systems reviewed and are negative.   Physical Exam Updated Vital Signs BP (!) 126/50 (BP Location: Right Arm)   Pulse 63   Temp 98.1 F (36.7 C) (Oral)   Resp 16   Wt 90.2 kg   LMP  (LMP Unknown)   SpO2 98%   BMI 36.96 kg/m  Physical Exam Vitals and nursing note reviewed.  Constitutional:      General: She is not in acute distress.    Appearance: Normal appearance. She is well-developed.  HENT:     Head: Normocephalic and atraumatic.  Eyes:     Extraocular Movements: Extraocular movements intact.     Conjunctiva/sclera:  Conjunctivae normal.     Pupils: Pupils are equal, round, and reactive to light.  Cardiovascular:     Rate and Rhythm: Normal rate and regular rhythm.     Heart sounds: No murmur heard. Pulmonary:     Effort: Pulmonary effort is normal. No respiratory distress.     Breath sounds: Normal breath sounds.  Abdominal:     Palpations: Abdomen is soft.     Tenderness: There is no abdominal tenderness.  Musculoskeletal:        General: No swelling.     Cervical back: Normal range of motion and neck supple.  Skin:    General: Skin is warm and dry.     Capillary Refill: Capillary refill takes less than 2 seconds.  Neurological:     General: No focal deficit present.  Mental Status: She is alert and oriented to person, place, and time.     Cranial Nerves: No cranial nerve deficit.     Sensory: No sensory deficit.     Motor: No weakness.  Psychiatric:        Mood and Affect: Mood normal.     ED Results / Procedures / Treatments   Labs (all labs ordered are listed, but only abnormal results are displayed) Labs Reviewed  CBC - Abnormal; Notable for the following components:      Result Value   WBC 14.7 (*)    Hemoglobin 11.9 (*)    All other components within normal limits  DIFFERENTIAL - Abnormal; Notable for the following components:   Lymphs Abs 6.9 (*)    All other components within normal limits  COMPREHENSIVE METABOLIC PANEL - Abnormal; Notable for the following components:   Glucose, Bld 131 (*)    Creatinine, Ser 1.23 (*)    GFR, Estimated 47 (*)    All other components within normal limits  I-STAT CHEM 8, ED - Abnormal; Notable for the following components:   Creatinine, Ser 1.10 (*)    Glucose, Bld 128 (*)    All other components within normal limits  CBG MONITORING, ED - Abnormal; Notable for the following components:   Glucose-Capillary 131 (*)    All other components within normal limits  PROTIME-INR  APTT    EKG EKG Interpretation  Date/Time:  Monday September 03 2021 14:24:46 EDT Ventricular Rate:  61 PR Interval:  191 QRS Duration: 178 QT Interval:  447 QTC Calculation: 451 R Axis:   63 Text Interpretation: Sinus rhythm Right bundle branch block No significant change since last tracing Confirmed by Fredia Sorrow [phone]) on 09/03/2021 3:37:53 PM  Radiology MR BRAIN WO CONTRAST  Result Date: 09/03/2021 CLINICAL DATA:  Neuro deficit, acute, stroke suspected EXAM: MRI HEAD WITHOUT CONTRAST TECHNIQUE: Multiplanar, multiecho pulse sequences of the brain and surrounding structures were obtained without intravenous contrast. COMPARISON:  CT head from the same day. FINDINGS: Brain: No acute infarction, hemorrhage, hydrocephalus, extra-axial collection or mass lesion. Mild scattered T2/FLAIR hyperintensities in the white matter, nonspecific but compatible with chronic microvascular disease. Vascular: Major arterial flow voids are maintained skull base. Skull and upper cervical spine: Normal marrow signal. Sinuses/Orbits: No acute findings. Other: No mastoid effusions. IMPRESSION: No evidence of acute intracranial abnormality. Electronically Signed   By: Margaretha Sheffield M.D.   On: 09/03/2021 15:05   CT HEAD CODE STROKE WO CONTRAST  Result Date: 09/03/2021 CLINICAL DATA:  Code stroke. Neuro deficit, acute, stroke suspected. EXAM: CT HEAD WITHOUT CONTRAST TECHNIQUE: Contiguous axial images were obtained from the base of the skull through the vertex without intravenous contrast. RADIATION DOSE REDUCTION: This exam was performed according to the departmental dose-optimization program which includes automated exposure control, adjustment of the mA and/or kV according to patient size and/or use of iterative reconstruction technique. COMPARISON:  Brain MRI 12/28/2016. Head CT 12/07/2012. FINDINGS: Brain: Mild generalized cerebral atrophy. Mild patchy and ill-defined hypoattenuation within the cerebral white matter, nonspecific but compatible with chronic small vessel  ischemic disease. Symmetric mineralization within the basal ganglia. There is no acute intracranial hemorrhage. No demarcated cortical infarct. No extra-axial fluid collection. No evidence of an intracranial mass. No midline shift. Vascular: No hyperdense vessel. Atherosclerotic calcifications. Skull: No fracture or aggressive osseous lesion. Sinuses/Orbits: No mass or acute finding within the imaged orbits. No significant paranasal sinus disease at the imaged levels. ASPECTS [facility] Stroke Program  Early CT Score) - Ganglionic level infarction (caudate, lentiform nuclei, internal capsule, insula, M1-M3 cortex): 7 - Supraganglionic infarction (M4-M6 cortex): 3 Total score (0-10 with 10 being normal): 10 These results were communicated to Dr. Theda Sers of neurology at 2:05 [place] 09/03/2021 by text page via the [facility] messaging system. IMPRESSION: No evidence of acute intracranial abnormality. Mild chronic small vessel ischemic changes within the cerebral white matter. Mild generalized cerebral atrophy. Electronically Signed   By: Kellie Simmering D.O.   On: 09/03/2021 14:05    Procedures Procedures    Medications Ordered in ED Medications  sodium chloride flush (NS) 0.9 % injection 3 mL (3 mLs Intravenous Given 09/03/21 1427)  LORazepam (ATIVAN) injection 1 mg (0.5 mg Intravenous Given 09/03/21 1419)    ED Course/ Medical Decision Making/ A&P                           Medical Decision Making Amount and/or Complexity of Data Reviewed Labs: ordered. Radiology: ordered.  CRITICAL CARE Performed by: Fredia Sorrow Total critical care time: 40 minutes Critical care time was exclusive of separately billable procedures and treating other patients. Critical care was necessary to treat or prevent imminent or life-threatening deterioration. Critical care was time spent personally by me on the following activities: development of treatment plan with patient and/or surrogate as well as nursing, discussions with  consultants, evaluation of patient's response to treatment, examination of patient, obtaining history from patient or surrogate, ordering and performing treatments and interventions, ordering and review of laboratory studies, ordering and review of radiographic studies, pulse oximetry and re-evaluation of patient's condition.  Patient arrived as a code stroke.  Patient taken immediately to head CT head CT was negative and from there to MRI.  MRI results are back and MRI is completely normal.  Patient's basic electrolytes all normal.  Creatinine 1.10 glucose 128 INR is 1 CBC bit of a leukocytosis with a white count of 14.7 hemoglobin 11.9.  Platelets 376 normal differential complete metabolic panel significant for GFR 47 normal liver function test.  Discussed with Dr. Theda Sers neuro hospitalist.  He stated if MRI negative patient can be discharged home follow-up with her primary care doctor return for any new or worse symptoms.  He felt symptoms were not consistent with a TIA were more consistent with an anxiety attack since there was stuttering and then there was shaking.  Does not feel it is consistent with a focal seizure does not feel consistent with stroke and she stable for discharge home.  Final Clinical Impression(s) / ED Diagnoses Final diagnoses:  Stroke-like episode    Rx / DC Orders ED Discharge Orders     None         Fredia Sorrow, MD 09/03/21 1637

## 2021-09-03 NOTE — ED Notes (Signed)
Earings found in room after pt DC.  Earings placed in specimen cup and placed in red bag and placed in drawer at registration.  Husband called and will pick up in morning.

## 2021-09-03 NOTE — Code Documentation (Addendum)
Stroke Response Nurse Documentation Code Documentation  [name] is a 71 y.o. female arriving to [facility]  via [facility] on 09/03/2021 with past medical hx of CAD, bilateral cataracts, diabetes mellitus, HLD, HTN, sleep apnea. On aspirin 81 mg daily and clopidogrel 75 mg daily. Code stroke was activated by EMS.   Patient was out with her husband where she was LKW at 1250 and now complaining of left leg numbness. Patient had been out to eat with her husband when she began to tremor all over. She then began complaining of left leg weakness and numbness.    Stroke team at the bedside on patient arrival. Labs drawn and patient cleared for CT by Dr. Wilkie Aye. Patient to CT with team. NIHSS 3, see documentation for details and code stroke times. Patient with disoriented, left leg weakness, and left decreased sensation on exam. The following imaging was completed:  CT Head. Patient is not a candidate for IV Thrombolytic due to low suspicion. Patient is not a candidate for IR due to exam negative for LVO.   Care Plan: q2h NIHSS and VS, MRI.   Bedside handoff with ED RN Josh.    Karington Zarazua L Afshin Chrystal  Rapid Response RN

## 2021-09-03 NOTE — Discharge Instructions (Addendum)
By neurology for discharge home.  They recommend following up with her primary care doctor Dr. Patsy Lager returning for any new or worse symptoms.  CT head and MRI brain without any acute findings.  No evidence of stroke.

## 2021-09-03 NOTE — ED Triage Notes (Signed)
PT BIB [facility] as a Code Stroke from [employer]. Pt has sudden onset of tremors, LLE numbness and Aphasia.  Aphasia began to improve en route and was better on arrival.   EMs VS 150/90 HR 70 98%, CBG 139

## 2021-09-03 NOTE — ED Notes (Signed)
DC instructions reviewed with pt. Pt verbalized understanding.  PT DC.  

## 2021-09-03 NOTE — Consult Note (Addendum)
Neurology Consultation  Reason for Consult: Code Stroke Referring Physician: Horton  CC: weakness  History is obtained from:patient  HPI: [name] is a 71 y.o. female  CAD, bilateral cataracts, diabetes mellitus, HLD, HTN, sleep apnea. On aspirin 81 mg daily and clopidogrel 75 mg daily developed diffuse tremor followed by left sided weakness and numbness.   LKW: 1250 TNK given?: no, low suspicion IR Thrombectomy? No, not a candidate Modified Rankin Scale: 0-Completely asymptomatic and back to baseline post- stroke NIHSS 3  ROS: A complete ROS was performed and is negative except as noted in the HPI.   Past Medical History:  Diagnosis Date   Anemia    Arthritis    Bronchitis    hx of    CAD (coronary artery disease)    a. s/p DES to LAD in 2010 with patent stent by cath in 2014 b. low-risk NST in 11/2016   Cataracts, bilateral    Diabetes mellitus without complication ([place])    Family history of adverse reaction to anesthesia    pts mother had difficulty awakening    GERD (gastroesophageal reflux disease)    Hyperlipidemia LDL goal < 70    Hypertension    Lumbar back pain    Numbness    left leg and foot    Plantar fascia rupture    Left Foot   Sleep apnea    uses CPAP     Family History  Problem Relation Age of Onset   Coronary artery disease Mother    Rheum arthritis Mother    Dementia Mother    Heart attack Father    Hypertension Father    Hyperlipidemia Father    Other Father        MVA   Hypertension Brother    Cancer Brother    Heart disease Brother    Cancer Paternal Grandmother        stomach   Diabetes Paternal Grandfather    Breast cancer Neg Hx    Social History:   reports that she has never smoked. She has never used smokeless tobacco. She reports current alcohol use. She reports that she does not use drugs.  Medications  Current Facility-Administered Medications:    sodium chloride flush (NS) 0.9 % injection 3 mL, 3 mL, Intravenous,  Once, Horton, Kristie M, DO  Current Outpatient Medications:    amLODipine (NORVASC) 10 MG tablet, Take 1 tablet by mouth daily., Disp: 90 tablet, Rfl: 1   aspirin EC 81 MG EC tablet, Take 1 tablet (81 mg total) by mouth daily., Disp: 30 tablet, Rfl: 1   chlorpheniramine (CHLOR-TRIMETON) 4 MG tablet, Take 4 mg by mouth daily., Disp: , Rfl:    cholecalciferol (VITAMIN D) 1000 UNITS tablet, Take 1,000 Units by mouth daily., Disp: , Rfl:    clopidogrel (PLAVIX) 75 MG tablet, TAKE 1 TABLET BY MOUTH DAILY., Disp: 90 tablet, Rfl: 3   cyclobenzaprine (FLEXERIL) 10 MG tablet, TAKE 1 TABLET BY MOUTH TWICE DAILY AS NEEDED FOR MUSCLE SPASMS, Disp: 30 tablet, Rfl: 2   diazepam (VALIUM) 2 MG tablet, Take 1 tablet (2 mg total) by mouth every 8 (eight) hours as needed for muscle spasms., Disp: 30 tablet, Rfl: 2   ezetimibe (ZETIA) 10 MG tablet, TAKE 1 TABLET BY MOUTH ONCE A DAY, Disp: 90 tablet, Rfl: 3   famotidine (PEPCID) 10 MG tablet, Take 10 mg by mouth at bedtime., Disp: , Rfl:    fenofibrate (TRICOR) 145 MG tablet, Take 1 tablet (145  mg total) by mouth daily., Disp: 90 tablet, Rfl: 3   FLUoxetine (PROZAC) 20 MG capsule, TAKE 1 CAPSULE BY MOUTH ONCE DAILY, Disp: 90 capsule, Rfl: 3   fluticasone (FLONASE) 50 MCG/ACT nasal spray, Place 1 spray into both nostrils daily as needed for allergies or rhinitis., Disp: 16 g, Rfl: 11   HYDROcodone-acetaminophen (NORCO/VICODIN) 5-325 MG tablet, Take 1 tablet by mouth every 8 (eight) hours as needed., Disp: 10 tablet, Rfl: 0   icosapent Ethyl (VASCEPA) 1 g capsule, TAKE 2 CAPSULES BY MOUTH TWICE DAILY, Disp: 360 capsule, Rfl: 3   isosorbide mononitrate (IMDUR) 30 MG 24 hr tablet, Take 1 tablet (30 mg total) by mouth daily., Disp: 90 tablet, Rfl: 2   metFORMIN (GLUCOPHAGE-XR) 500 MG 24 hr tablet, Take 1 tablet  by mouth 2  times daily., Disp: 180 tablet, Rfl: 3   metoprolol succinate (TOPROL-XL) 25 MG 24 hr tablet, TAKE 1/2 OF A TABLET BY MOUTH DAILY., Disp: 45 tablet,  Rfl: 1   Multiple Vitamin (MULTIVITAMIN WITH MINERALS) TABS tablet, Take 1 tablet by mouth daily., Disp: , Rfl:    nitroGLYCERIN (NITROSTAT) 0.4 MG SL tablet, Place 1 tablet under the tongue every 5 minutes as needed for chest pain., Disp: 25 tablet, Rfl: 2   RABEprazole (ACIPHEX) 20 MG tablet, Take 1 tablet (20 mg total) by mouth daily 30 minutes prior to food (Patient taking differently: Take 20 mg by mouth. 4 times per week), Disp: 90 tablet, Rfl: 3   simvastatin (ZOCOR) 40 MG tablet, Take 1 tablet (40 mg total) by mouth daily., Disp: 90 tablet, Rfl: 3   telmisartan (MICARDIS) 40 MG tablet, TAKE 1 TABLET BY MOUTH EVERY MORNING * NEEDS OFFICE VISIT, Disp: 90 tablet, Rfl: 2   Exam: Current vital signs: Wt 90.2 kg   LMP  (LMP Unknown)   BMI 36.96 kg/m  Vital signs in last 24 hours: Weight:  [90.2 kg] 90.2 kg (06/12 1353)  GENERAL: Awake, alert, in no acute distress Psych: Affect appropriate for situation, patient is calm and cooperative with examination Head: Normocephalic and atraumatic, without obvious abnormality EENT: Normal conjunctivae, dry mucous membranes, no OP obstruction LUNGS: Normal respiratory effort. Non-labored breathing on room air CV: Regular rate and rhythm on telemetry ABDOMEN: Soft, non-tender, non-distended Extremities: warm, well perfused, without obvious deformity  NEURO:  Mental Status: Awake, alert, and oriented to person, place, time, and situation. He/She is able to provide a clear and coherent history of present illness. Speech/Language: inconsistent stuttering interspersed with normal speech without aphasia.   Naming, repetition, fluency, and comprehension intact without aphasia  No neglect is noted Cranial Nerves:  II: PERRL 3 mm/brisk. visual fields full.  III, IV, VI: EOMI. Lid elevation symmetric and full.  V: Sensation is intact to light touch and symmetrical to face. Blinks to threat. Moves jaw back and forth.  VII: Face is symmetric resting and  smiling. Able to puff cheeks and raise eyebrows.  VIII: Hearing intact to voice IX, X: Palate elevation is symmetric. Phonation normal.  XI: Normal sternocleidomastoid and trapezius muscle strength XII: Tongue protrudes midline without fasciculations.   Motor: 5/5 strength is all muscle groups.  Tone is normal. Bulk is normal.  Sensation: Intact to light touch bilaterally in all four extremities. No extinction to DSS present.  Coordination: FTN intact bilaterally and HKS fluctuating with entrainment which normalized with distraction. No pronator drift.  DTRs: 2+ throughout.  Gait: Deferred  Labs I have reviewed labs in epic and the results pertinent  to this consultation are: CBG 131   Imaging I have reviewed the images obtained:  CT-scan of the brain no acute ischemic changes ASPECTS 10.  MRI examination of the brain no evidence of acute intracranial abnormality.  Assessment: 71 year old woman PMHx as noted above on ASA and clopidogrel with left sided weakness, numbness and slurred speech. Exam was inconsistent and effort dependent which rapidly normalized with distraction. Exam showed entrainment. MRI negative for acute intracranial abnormality and very low likelihood of TIA/stroke. Exam is reassuring.   Recommendations: - Continue home dose of ASA and clopidogrel. - Follow up with PCP. - Neurology will remain available, please call for questions.   Patient seen and examined by NP/APP with MD. MD to update note as needed.   Janine Ores, DNP, FNP-BC [facility] Pager: [phone]  Attending Attestation:  Patient seen, examined, labs,vitals and notes reviewed. Discussed plan with Charlean Merl, NP and agree with assessment and plan as documented above. I have independently reviewed the chart, obtained history, review of systems and examined the patient.  This patient is critically ill and at significant risk of neurological worsening, death and care requires  constant monitoring of vital signs, hemodynamics,respiratory and cardiac monitoring, neurological assessment, discussion with family, other specialists and medical decision making of high complexity. I spent 60 minutes of neurocritical care time  in the care of  this patient. This was time spent independent of any time provided by nurse practitioner or PA.  Electronically signed by:  Lynnae Sandhoff, MD Page: [phone] 09/03/2021, 6:03 PM  Electronically signed by:  Lynnae Sandhoff, MD Page: [phone] 09/03/2021, 4:46 PM

## 2021-09-03 NOTE — ED Notes (Signed)
1.5 mg Ativan wasted and witnessed by Wilburn Mylar after the pt was DC'd.

## 2021-09-03 NOTE — ED Notes (Signed)
Patient transported to MRI 

## 2021-09-04 ENCOUNTER — Encounter (HOSPITAL_BASED_OUTPATIENT_CLINIC_OR_DEPARTMENT_OTHER): Payer: Self-pay | Admitting: Emergency Medicine

## 2021-09-04 ENCOUNTER — Emergency Department (HOSPITAL_BASED_OUTPATIENT_CLINIC_OR_DEPARTMENT_OTHER)
Admission: EM | Admit: 2021-09-04 | Discharge: 2021-09-04 | Disposition: A | Discharge status: Home / Self Care | Payer: PPO | Attending: Emergency Medicine | Admitting: Emergency Medicine

## 2021-09-04 ENCOUNTER — Ambulatory Visit: Payer: PPO | Admitting: Family Medicine

## 2021-09-04 ENCOUNTER — Other Ambulatory Visit (HOSPITAL_BASED_OUTPATIENT_CLINIC_OR_DEPARTMENT_OTHER): Payer: Self-pay

## 2021-09-04 ENCOUNTER — Encounter: Payer: Self-pay | Admitting: Family Medicine

## 2021-09-04 ENCOUNTER — Other Ambulatory Visit: Payer: Self-pay

## 2021-09-04 DIAGNOSIS — I2510 Atherosclerotic heart disease of native coronary artery without angina pectoris: Secondary | ICD-10-CM | POA: Diagnosis not present

## 2021-09-04 DIAGNOSIS — E119 Type 2 diabetes mellitus without complications: Secondary | ICD-10-CM | POA: Insufficient documentation

## 2021-09-04 DIAGNOSIS — Z7982 Long term (current) use of aspirin: Secondary | ICD-10-CM | POA: Insufficient documentation

## 2021-09-04 DIAGNOSIS — M542 Cervicalgia: Secondary | ICD-10-CM | POA: Diagnosis not present

## 2021-09-04 DIAGNOSIS — I10 Essential (primary) hypertension: Secondary | ICD-10-CM | POA: Insufficient documentation

## 2021-09-04 DIAGNOSIS — Z79899 Other long term (current) drug therapy: Secondary | ICD-10-CM | POA: Diagnosis not present

## 2021-09-04 DIAGNOSIS — M21962 Unspecified acquired deformity of left lower leg: Secondary | ICD-10-CM

## 2021-09-04 DIAGNOSIS — M62838 Other muscle spasm: Secondary | ICD-10-CM | POA: Diagnosis not present

## 2021-09-04 DIAGNOSIS — Z7984 Long term (current) use of oral hypoglycemic drugs: Secondary | ICD-10-CM | POA: Diagnosis not present

## 2021-09-04 DIAGNOSIS — Z7902 Long term (current) use of antithrombotics/antiplatelets: Secondary | ICD-10-CM | POA: Diagnosis not present

## 2021-09-04 DIAGNOSIS — R251 Tremor, unspecified: Secondary | ICD-10-CM | POA: Insufficient documentation

## 2021-09-04 LAB — COMPREHENSIVE METABOLIC PANEL
ALT: 24 U/L (ref 0–44)
AST: 26 U/L (ref 15–41)
Albumin: 4.1 g/dL (ref 3.5–5.0)
Alkaline Phosphatase: 45 U/L (ref 38–126)
Anion gap: 9 (ref 5–15)
BUN: 20 mg/dL (ref 8–23)
CO2: 24 mmol/L (ref 22–32)
Calcium: 9.5 mg/dL (ref 8.9–10.3)
Chloride: 106 mmol/L (ref 98–111)
Creatinine, Ser: 0.98 mg/dL (ref 0.44–1.00)
GFR, Estimated: >60 mL/min (ref >60)
Glucose, Bld: 125 mg/dL — ABNORMAL HIGH (ref 70–99)
Potassium: 3.9 mmol/L (ref 3.5–5.1)
Sodium: 139 mmol/L (ref 135–145)
Total Bilirubin: 0.5 mg/dL (ref 0.3–1.2)
Total Protein: 7.3 g/dL (ref 6.5–8.1)

## 2021-09-04 LAB — CBC WITH DIFFERENTIAL/PLATELET
Abs Immature Granulocytes: 0.04 10*3/uL (ref 0.00–0.07)
Basophils Absolute: 0.1 10*3/uL (ref 0.0–0.1)
Basophils Relative: 1 %
Eosinophils Absolute: 0.4 10*3/uL (ref 0.0–0.5)
Eosinophils Relative: 3 %
HCT: 37.8 % (ref 36.0–46.0)
Hemoglobin: 12.1 g/dL (ref 12.0–15.0)
Immature Granulocytes: 0 %
Lymphocytes Relative: 41 %
Lymphs Abs: 5.2 10*3/uL — ABNORMAL HIGH (ref 0.7–4.0)
MCH: 25.7 pg — ABNORMAL LOW (ref 26.0–34.0)
MCHC: 32 g/dL (ref 30.0–36.0)
MCV: 80.3 fL (ref 80.0–100.0)
Monocytes Absolute: 0.9 10*3/uL (ref 0.1–1.0)
Monocytes Relative: 7 %
Neutro Abs: 6.2 10*3/uL (ref 1.7–7.7)
Neutrophils Relative %: 48 %
Platelets: 371 10*3/uL (ref 150–400)
RBC: 4.71 MIL/uL (ref 3.87–5.11)
RDW: 15.1 % (ref 11.5–15.5)
WBC: 12.8 10*3/uL — ABNORMAL HIGH (ref 4.0–10.5)
nRBC: 0 % (ref 0.0–0.2)

## 2021-09-04 LAB — URINALYSIS, ROUTINE W REFLEX MICROSCOPIC
Bilirubin Urine: NEGATIVE
Glucose, UA: NEGATIVE mg/dL
Hgb urine dipstick: NEGATIVE
Ketones, ur: NEGATIVE mg/dL
Leukocytes,Ua: NEGATIVE
Nitrite: NEGATIVE
Protein, ur: NEGATIVE mg/dL
Specific Gravity, Urine: 1.01 (ref 1.005–1.030)
pH: 6.5 (ref 5.0–8.0)

## 2021-09-04 LAB — TROPONIN I (HIGH SENSITIVITY): Troponin I (High Sensitivity): 5 ng/L (ref <18)

## 2021-09-04 MED ORDER — CYCLOBENZAPRINE HCL 10 MG PO TABS
10.0000 mg | ORAL_TABLET | Freq: Two times a day (BID) | ORAL | 0 refills | Status: DC | PRN
  Filled 2021-09-04: qty 20, 10d supply, fill #0

## 2021-09-04 MED ORDER — DIAZEPAM 2 MG PO TABS
2.0000 mg | ORAL_TABLET | Freq: Two times a day (BID) | ORAL | 0 refills | Status: DC | PRN
  Filled 2021-09-04: qty 15, 8d supply, fill #0

## 2021-09-04 NOTE — Assessment & Plan Note (Signed)
Acute on chronic in nature.  Has pronation of the left foot.  -Counseled on home exercise therapy and supportive care. -Custom orthotics with left scaphoid pad.

## 2021-09-04 NOTE — ED Provider Notes (Signed)
[facility] EMERGENCY DEPARTMENT Provider Note   CSN: [id number] Arrival date & time: 09/04/21  1144     History  Chief Complaint  Patient presents with   Neck Pain   Tremors    [name] is a 71 y.o. female.  HPI     71yo female with history of diabetes, hyperlipidemia, hypertension, CAD, recent diagnosis of cervical radiculopathy, who presented to the [facility] emergency department yesterday as a code stroke due to symptoms of diffuse trauma followed by left-sided weakness and numbness for which she had CT head, and MRI brain that showed no acute abnormalities, and neurologic evaluation that felt that presentation was unlikely to be stroke, TIA, or seizures, who presents with a repeat episode that lasted a shorter period of time of tremors in both arms and legs, stuttering speech, which started after experiencing left neck pain.  Left sided neck pain, shoulder pain, began 3 weeks ago, worse with movement Now neck hurting more than shoulder, went to see Dr. Jordan Likes, did [place] and injection in muscle, solumedrol.  Came back to get orthodics, neck pain is dull right now but sometimes is like a spasm.  Today, at 1145PM began to have the neck pain with spasm and tremors all over  When neck pain begins, will have tremors both arms and both legs, feels like starts stuttering and has bilateral arm/leg tremors/shaking Yesterday lasted about 30-40 minutes or an hour Today lasted 30 minutes Started easing off before pulling up to [facility] yesterday Yesterday was the first shaking episode.  About 3 weeks ago both arms shaking but yesterday was the first time it was everything.  Left foot numb ankle down , coming and going, started yesterday, does have diagnosis of numbness of foot in medical chart, prior plantar fascia rupture and ankle surgery, reports has had numbness in left foot for a long time intermittently, even prior to this  Numbness in foot comes with shaking  everywhere Has referral to [place] from Dr. Dallas Schimke  No weakness Did have stuttering, felt like mouth was drawn up  Mild discomfort in chest, does have that feeling with anxiety sometimes  No new medications  Past Medical History:  Diagnosis Date   Anemia    Arthritis    Bronchitis    hx of    CAD (coronary artery disease)    a. s/p DES to LAD in 2010 with patent stent by cath in 2014 b. low-risk NST in 11/2016   Cataracts, bilateral    Diabetes mellitus without complication (HCC)    Family history of adverse reaction to anesthesia    pts mother had difficulty awakening    GERD (gastroesophageal reflux disease)    Hyperlipidemia LDL goal < 70    Hypertension    Lumbar back pain    Numbness    left leg and foot    Plantar fascia rupture    Left Foot   Sleep apnea    uses CPAP     Home Medications Prior to Admission medications   Medication Sig Start Date End Date Taking? Authorizing Provider  cyclobenzaprine (FLEXERIL) 10 MG tablet Take 1 tablet (10 mg total) by mouth 2 (two) times daily as needed for muscle spasms. 09/04/21  Yes Alvira Monday, MD  diazepam (VALIUM) 2 MG tablet Take 1 tablet (2 mg total) by mouth every 12 (twelve) hours as needed for anxiety or muscle spasms. 09/04/21  Yes Alvira Monday, MD  amLODipine (NORVASC) 10 MG tablet Take 1 tablet  by mouth daily. 05/17/21   Copland, [name], MD  aspirin EC 81 MG EC tablet Take 1 tablet (81 mg total) by mouth daily. 08/30/16   Swinteck, Arlys John, MD  chlorpheniramine (CHLOR-TRIMETON) 4 MG tablet Take 4 mg by mouth daily.    [provider]  cholecalciferol (VITAMIN D) 1000 UNITS tablet Take 1,000 Units by mouth daily.    [provider]  clopidogrel (PLAVIX) 75 MG tablet TAKE 1 TABLET BY MOUTH DAILY. Patient taking differently: Take 75 mg by mouth daily. 04/17/21 04/17/22  Hilty, Lisette Abu, MD  cyclobenzaprine (FLEXERIL) 10 MG tablet TAKE 1 TABLET BY MOUTH TWICE DAILY AS NEEDED FOR MUSCLE  SPASMS Patient taking differently: Take 10 mg by mouth 2 (two) times daily as needed for muscle spasms. 05/08/18   Copland, [name], MD  denosumab (PROLIA) 60 MG/ML SOSY injection Inject 60 mg into the skin every 6 (six) months.    [provider]  diazepam (VALIUM) 2 MG tablet Take 1 tablet (2 mg total) by mouth every 8 (eight) hours as needed for muscle spasms. 02/09/21   Copland, [name], MD  ezetimibe (ZETIA) 10 MG tablet TAKE 1 TABLET BY MOUTH ONCE A DAY Patient taking differently: Take 10 mg by mouth daily. 03/21/21 03/21/22  Chrystie Nose, MD  fenofibrate (TRICOR) 145 MG tablet Take 1 tablet (145 mg total) by mouth daily. 08/18/20   Hilty, Lisette Abu, MD  FLUoxetine (PROZAC) 20 MG capsule TAKE 1 CAPSULE BY MOUTH ONCE DAILY Patient taking differently: Take 20 mg by mouth daily. 11/21/20 11/21/21  Copland, [name], MD  fluticasone (FLONASE) 50 MCG/ACT nasal spray Place 1 spray into both nostrils daily as needed for allergies or rhinitis. 02/09/21   Copland, [name], MD  HYDROcodone-acetaminophen (NORCO/VICODIN) 5-325 MG tablet Take 1 tablet by mouth every 8 (eight) hours as needed. 08/16/21   Myra Rude, MD  icosapent Ethyl (VASCEPA) 1 g capsule TAKE 2 CAPSULES BY MOUTH TWICE DAILY Patient taking differently: Take 2 g by mouth 2 (two) times daily. 08/10/21   Hilty, Lisette Abu, MD  isosorbide mononitrate (IMDUR) 30 MG 24 hr tablet Take 1 tablet (30 mg total) by mouth daily. 05/08/21   Hilty, Lisette Abu, MD  metFORMIN (GLUCOPHAGE-XR) 500 MG 24 hr tablet Take 1 tablet  by mouth 2  times daily. Patient taking differently: Take 500 mg by mouth daily with breakfast. 06/27/21   Copland, [name], MD  metoprolol succinate (TOPROL-XL) 25 MG 24 hr tablet TAKE 1/2 OF A TABLET BY MOUTH DAILY. Patient taking differently: Take 12.5 mg by mouth daily. 08/13/21   Copland, [name], MD  Multiple Vitamin (MULTIVITAMIN WITH MINERALS) TABS tablet Take 1 tablet by mouth daily.    [provider]  nitroGLYCERIN (NITROSTAT) 0.4 MG SL tablet Place 1 tablet under the tongue every 5 minutes as needed for chest pain. 11/02/20   Copland, [name], MD  RABEprazole (ACIPHEX) 20 MG tablet Take 1 tablet (20 mg total) by mouth daily 30 minutes prior to food 04/23/21   Copland, [name], MD  simvastatin (ZOCOR) 40 MG tablet Take 1 tablet (40 mg total) by mouth daily. 07/03/21   Copland, [name], MD  telmisartan (MICARDIS) 40 MG tablet TAKE 1 TABLET BY MOUTH EVERY MORNING * NEEDS OFFICE VISIT Patient taking differently: Take 40 mg by mouth daily. 04/17/21 04/17/22  Chrystie Nose, MD  rosuvastatin (CRESTOR) 20 MG tablet Take 1 tablet (20 mg total) by mouth daily. 12/29/19 02/27/20  Ronney Asters, NP      Allergies    Crestor [rosuvastatin] and Niacin and related    Review of Systems   Review of Systems  Physical Exam Updated Vital Signs BP 123/69   Pulse (!) 51   Temp 98.6 F (37 C) (Oral)   Resp (!) 21   Ht 5' 1.5" (1.562 m)   Wt 89.8 kg   LMP  (LMP Unknown)   SpO2 100%   BMI 36.81 kg/m  Physical Exam Vitals and nursing note reviewed.  Constitutional:      General: She is not in acute distress.    Appearance: Normal appearance. She is well-developed. She is not ill-appearing or diaphoretic.  HENT:     Head: Normocephalic and atraumatic.  Eyes:     General: No visual field deficit.    Extraocular Movements: Extraocular movements intact.     Conjunctiva/sclera: Conjunctivae normal.     Pupils: Pupils are equal, round, and reactive to light.  Cardiovascular:     Rate and Rhythm: Normal rate and regular rhythm.     Pulses: Normal pulses.     Heart sounds: Normal heart sounds. No murmur heard.    No friction rub. No gallop.  Pulmonary:     Effort: Pulmonary effort is normal. No respiratory distress.     Breath sounds: Normal breath sounds. No wheezing or rales.  Abdominal:     General: There is no distension.     Palpations: Abdomen is soft.     Tenderness:  There is no abdominal tenderness. There is no guarding.  Musculoskeletal:        General: No swelling or tenderness.     Cervical back: Normal range of motion.  Skin:    General: Skin is warm and dry.     Findings: No erythema or rash.  Neurological:     General: No focal deficit present.     Mental Status: She is alert and oriented to person, place, and time.     GCS: GCS eye subscore is 4. GCS verbal subscore is 5. GCS motor subscore is 6.     Cranial Nerves: No cranial nerve deficit, dysarthria or facial asymmetry.     Sensory: No sensory deficit.     Motor: No weakness or tremor.     Coordination: Coordination normal. Finger-Nose-Finger Test normal.     Comments: Reports altered sensation to bottom of left foot No other numbness Reported difficulty lifting left leg but after lifting good strength and immediately lifted leg up again with normal strength  No pronator drift, normal visual fields, normal coordination, normal strength upper extremities.  Smile symmetric, slow to start smiling     ED Results / Procedures / Treatments   Labs (all labs ordered are listed, but only abnormal results are displayed) Labs Reviewed  CBC WITH DIFFERENTIAL/PLATELET - Abnormal; Notable for the following components:      Result Value   WBC 12.8 (*)    MCH 25.7 (*)    Lymphs Abs 5.2 (*)    All other components within normal limits  COMPREHENSIVE METABOLIC PANEL - Abnormal; Notable for the following components:   Glucose, Bld 125 (*)    All other components within normal limits  URINALYSIS, ROUTINE W REFLEX MICROSCOPIC  TROPONIN I (HIGH SENSITIVITY)    EKG EKG Interpretation  Date/Time:  Tuesday September 04 2021 12:34:17 EDT Ventricular Rate:  61 PR Interval:  185 QRS Duration: 162 QT Interval:  442 QTC Calculation: 446 R  Axis:   63 Text Interpretation: Sinus rhythm Right bundle branch block No significant change since last tracing Confirmed by Alvira Monday ([id number]) on 09/04/2021  12:51:35 PM  Radiology MR BRAIN WO CONTRAST  Result Date: 09/03/2021 CLINICAL DATA:  Neuro deficit, acute, stroke suspected EXAM: MRI HEAD WITHOUT CONTRAST TECHNIQUE: Multiplanar, multiecho pulse sequences of the brain and surrounding structures were obtained without intravenous contrast. COMPARISON:  CT head from the same day. FINDINGS: Brain: No acute infarction, hemorrhage, hydrocephalus, extra-axial collection or mass lesion. Mild scattered T2/FLAIR hyperintensities in the white matter, nonspecific but compatible with chronic microvascular disease. Vascular: Major arterial flow voids are maintained skull base. Skull and upper cervical spine: Normal marrow signal. Sinuses/Orbits: No acute findings. Other: No mastoid effusions. IMPRESSION: No evidence of acute intracranial abnormality. Electronically Signed   By: Feliberto Harts M.D.   On: 09/03/2021 15:05   CT HEAD CODE STROKE WO CONTRAST  Result Date: 09/03/2021 CLINICAL DATA:  Code stroke. Neuro deficit, acute, stroke suspected. EXAM: CT HEAD WITHOUT CONTRAST TECHNIQUE: Contiguous axial images were obtained from the base of the skull through the vertex without intravenous contrast. RADIATION DOSE REDUCTION: This exam was performed according to the departmental dose-optimization program which includes automated exposure control, adjustment of the mA and/or kV according to patient size and/or use of iterative reconstruction technique. COMPARISON:  Brain MRI 12/28/2016. Head CT 12/07/2012. FINDINGS: Brain: Mild generalized cerebral atrophy. Mild patchy and ill-defined hypoattenuation within the cerebral white matter, nonspecific but compatible with chronic small vessel ischemic disease. Symmetric mineralization within the basal ganglia. There is no acute intracranial hemorrhage. No demarcated cortical infarct. No extra-axial fluid collection. No evidence of an intracranial mass. No midline shift. Vascular: No hyperdense vessel. Atherosclerotic  calcifications. Skull: No fracture or aggressive osseous lesion. Sinuses/Orbits: No mass or acute finding within the imaged orbits. No significant paranasal sinus disease at the imaged levels. ASPECTS (Alberta Stroke Program Early CT Score) - Ganglionic level infarction (caudate, lentiform nuclei, internal capsule, insula, M1-M3 cortex): 7 - Supraganglionic infarction (M4-M6 cortex): 3 Total score (0-10 with 10 being normal): 10 These results were communicated to Dr. Thomasena Edis of neurology at 2:05 pmon 09/03/2021 by text page via the [facility] messaging system. IMPRESSION: No evidence of acute intracranial abnormality. Mild chronic small vessel ischemic changes within the cerebral white matter. Mild generalized cerebral atrophy. Electronically Signed   By: Jackey Loge D.O.   On: 09/03/2021 14:05    Procedures Procedures    Medications Ordered in ED Medications - No data to display  ED Course/ Medical Decision Making/ A&P                           Medical Decision Making Amount and/or Complexity of Data Reviewed Labs: ordered.  Risk Prescription drug management.   71yo female with history of diabetes, hyperlipidemia, hypertension, CAD, recent diagnosis of cervical radiculopathy, who presented to the [facility] emergency department yesterday as a code stroke due to symptoms of diffuse trauma followed by left-sided weakness and numbness for which she had CT head, and MRI brain that showed no acute abnormalities, and neurologic evaluation that felt that presentation was unlikely to be stroke, TIA, or seizures, who presents with a repeat episode that lasted a shorter period of time of tremors in both arms and legs, stuttering speech, which started after experiencing left neck pain.  EKG was personally evaluated interpreted by me and showed normal sinus rhythm without other acute ST changes.  Troponin is  within normal limits, and have low suspicion for ACS given repeat episodes of pain.  She has tenderness  to her neck and shoulder, and have low suspicion that this represents a cardiac pain.  She has normal pulses in the bilateral upper and lower extremities and have low suspicion for aortic dissection by history and exam.  It is likely that her neck and shoulder pain do represent is cervical radiculopathy as previously diagnosed.  She does not have any fevers, weakness, or symptoms to suggest an acute surgical cervical spine emergency.  Feel is appropriate to continue to treat her symptoms with the stretches that were prescribed by Dr. Solon AugustaSmits, continue follow-up with him, and feel reasonable to begin muscle relaxants which she had been previously prescribed by her PCP including Flexeril volume.  Regarding her neurologic symptoms: These began in the setting of her beginning to have neck pain each time, and may represent a response to the pain.  She had a full work-up yesterday including CT head and MRI brain, and not have focal symptoms on her history, or abnormalities on her exam, with exception of paresthesias to the bottom of her left foot which she reports have been coming and going for a long time.  At this time in the setting of similar symptoms yesterday, with negative work-up, no headache, no neurologic changes, and no trauma, do not feel that repeat head CT is indicated today.  The symptoms described by history are not consistent with a TIA or seizure.  Considered other dystonic type reaction, however she is not on any other new medications to cause this.  Her [facility], and [facility] and recommend calling regarding her symptoms.  Labs otherwise completed and interpreted by me show no evidence of anemia, significant electrolyte abnormality, or renal failure.  She reported some foul-smelling urine, urinalysis obtained shows no evidence of infection.  We will give prescription for Valium and Flexeril which she had been for  previously prescribed for anxiety and muscle spasms.         Final Clinical Impression(s) / ED Diagnoses Final diagnoses:  Neck pain  Tremor  Muscle spasm    Rx / DC Orders ED Discharge Orders          Ordered    diazepam (VALIUM) 2 MG tablet  Every 12 hours PRN        09/04/21 1549    cyclobenzaprine (FLEXERIL) 10 MG tablet  2 times daily PRN        09/04/21 1549              Alvira MondaySchlossman, Mauriana Dann, MD 09/04/21 1610

## 2021-09-04 NOTE — ED Triage Notes (Signed)
Pt having left sided neck pain with tremors since yesterday.  Pt was at MD office and presented here when the tremors escalated.  Pt denies any other pain sites.  Pt had tremors to all 4 extremities upon presentation.  One she was placed on the stretcher, the tremors subsided.  Pt states she had cortisone injection to left shoulder 2 weeks ago and the tremors started about 4 weeks ago.  Pt seen at [facility] yesterday for the same.

## 2021-09-04 NOTE — Progress Notes (Signed)
[name] - 71 y.o. female MRN [id number]  Date of birth: 10/28/1950  SUBJECTIVE:  Including CC & ROS.  No chief complaint on file.   [name] is a 71 y.o. female that is here for orthotics.   Review of Systems See HPI   HISTORY: Past Medical, Surgical, Social, and Family History Reviewed & Updated per EMR.   Pertinent Historical Findings include:  Past Medical History:  Diagnosis Date   Anemia    Arthritis    Bronchitis    hx of    CAD (coronary artery disease)    a. s/p DES to LAD in 2010 with patent stent by cath in 2014 b. low-risk NST in 11/2016   Cataracts, bilateral    Diabetes mellitus without complication (HCC)    Family history of adverse reaction to anesthesia    pts mother had difficulty awakening    GERD (gastroesophageal reflux disease)    Hyperlipidemia LDL goal < 70    Hypertension    Lumbar back pain    Numbness    left leg and foot    Plantar fascia rupture    Left Foot   Sleep apnea    uses CPAP     Past Surgical History:  Procedure Laterality Date   ABDOMINAL HYSTERECTOMY     BACK SURGERY     BREAST CYST ASPIRATION  1995   CAROTID STENT  2009   pt denies    CORONARY ANGIOPLASTY WITH STENT PLACEMENT  2010and 10-02-2012   Stent DES, Xience to prox. LAD   DOPPLER ECHOCARDIOGRAPHY  08/01/2009   EF=>55%,LV normal   LEFT HEART CATH AND CORONARY ANGIOGRAPHY N/A 12/10/2019   Procedure: LEFT HEART CATH AND CORONARY ANGIOGRAPHY;  Surgeon: Lyn Records, MD;  Location: [facility] INVASIVE CV LAB;  Service: Cardiovascular;  Laterality: N/A;   LEFT HEART CATH AND CORONARY ANGIOGRAPHY N/A 10/30/2020   Procedure: LEFT HEART CATH AND CORONARY ANGIOGRAPHY;  Surgeon: Runell Gess, MD;  Location: [facility] INVASIVE CV LAB;  Service: Cardiovascular;  Laterality: N/A;   LEFT HEART CATHETERIZATION WITH CORONARY ANGIOGRAM N/A 10/02/2012   Procedure: LEFT HEART CATHETERIZATION WITH CORONARY ANGIOGRAM;  Surgeon: Runell Gess, MD;  Location: [facility] CATH LAB;  Service:  Cardiovascular;  Laterality: N/A;   lower arterial duplex  06/20/10   abi's normal,rgt 0.98,lft 1.06;bilateral PVRs normal   LUMBAR LAMINECTOMY/DECOMPRESSION MICRODISCECTOMY Left 01/06/2013   Procedure: MICRO LUMBAR DECOMPRESSION L4-5 AND L5-S1;  Surgeon: Javier Docker, MD;  Location: [facility] ORS;  Service: Orthopedics;  Laterality: Left;   LUMBAR LAMINECTOMY/DECOMPRESSION MICRODISCECTOMY Left 10/12/2014   Procedure: REVISION MICRO LUMBAR/DECOMPRESSION L4-5 LEFT ;  Surgeon: Jene Every, MD;  Location: [facility] ORS;  Service: Orthopedics;  Laterality: Left;   NM MYOCAR PERF WALL MOTION  09/22/2008   lexiscan-EF 83%; glogal LV systolic fx is norm. ,evidence of mild ischemia basal anterior,midanterior and apical lateral region(s).    ORIF PATELLA Right 08/29/2016   Procedure: OPEN REDUCTION INTERNAL (ORIF) FIXATION RIGHT PATELLA;  Surgeon: Samson Frederic, MD;  Location: [facility] ORS;  Service: Orthopedics;  Laterality: Right;  Adductor Block   TUBAL LIGATION     TYMPANOPLASTY Bilateral    UVULOPALATOPHARYNGOPLASTY     pt denies      PHYSICAL EXAM:  VS: Ht 5' 1.5" (1.562 m)   Wt 198 lb (89.8 kg)   LMP  (LMP Unknown)   BMI 36.81 kg/m  Physical Exam Gen: NAD, alert, cooperative with exam, well-appearing MSK:  Neurovascularly intact    Patient  was fitted for a standard, cushioned, semi-rigid orthotic. The orthotic was heated and afterward the patient stood on the orthotic blank positioned on the orthotic stand. The patient was positioned in subtalar neutral position and 10 degrees of ankle dorsiflexion in a weight bearing stance. After completion of molding, a stable base was applied to the orthotic blank. The blank was ground to a stable position for weight bearing. Size: 6 Pairs: 2 Base: Blue EVA Additional Posting and Padding: left scaphoid pad  The patient ambulated these, and they were very comfortable.   ASSESSMENT & PLAN:   Acquired foot deformity, left Acute on chronic in nature.  Has  pronation of the left foot.  -Counseled on home exercise therapy and supportive care. -Custom orthotics with left scaphoid pad.

## 2021-09-05 ENCOUNTER — Other Ambulatory Visit (HOSPITAL_COMMUNITY): Payer: Self-pay

## 2021-09-05 ENCOUNTER — Telehealth: Payer: Self-pay

## 2021-09-05 NOTE — Addendum Note (Signed)
Addended by: Pearline Cables on: 09/05/2021 06:33 PM   Modules accepted: Orders

## 2021-09-05 NOTE — Telephone Encounter (Signed)
Pt's husband called- he just got off the phone with [facility]- they informed they do not have a referral for this Pt. Pt and husband are requesting an urgent referral.

## 2021-09-05 NOTE — Telephone Encounter (Signed)
Called pt back- her main issue right now if pain in her neck.  She reports she was told at some point she had spinal stenosis in her neck She would like to look further at her neck as her heart and  brain have checked out ok.  Ordered plain C spine films to start- can then proceed to an MRI as needed

## 2021-09-06 ENCOUNTER — Encounter: Payer: Self-pay | Admitting: Family Medicine

## 2021-09-06 ENCOUNTER — Ambulatory Visit
Admission: RE | Admit: 2021-09-06 | Discharge: 2021-09-06 | Disposition: A | Discharge status: Home / Self Care | Payer: PPO | Source: Ambulatory Visit | Attending: Family Medicine | Admitting: Family Medicine

## 2021-09-06 DIAGNOSIS — M542 Cervicalgia: Secondary | ICD-10-CM

## 2021-09-06 DIAGNOSIS — R200 Anesthesia of skin: Secondary | ICD-10-CM

## 2021-09-06 NOTE — Telephone Encounter (Signed)
Urgent referral placed to [facility].

## 2021-09-07 ENCOUNTER — Encounter: Payer: Self-pay | Admitting: Family Medicine

## 2021-09-08 ENCOUNTER — Other Ambulatory Visit (HOSPITAL_COMMUNITY): Payer: Self-pay

## 2021-09-11 ENCOUNTER — Other Ambulatory Visit: Payer: Self-pay

## 2021-09-11 ENCOUNTER — Other Ambulatory Visit (HOSPITAL_COMMUNITY): Payer: Self-pay

## 2021-09-12 ENCOUNTER — Ambulatory Visit: Payer: PPO | Admitting: Diagnostic Neuroimaging

## 2021-09-12 ENCOUNTER — Other Ambulatory Visit (HOSPITAL_COMMUNITY): Payer: Self-pay

## 2021-09-12 ENCOUNTER — Telehealth: Payer: Self-pay | Admitting: Internal Medicine

## 2021-09-12 ENCOUNTER — Encounter: Payer: Self-pay | Admitting: Diagnostic Neuroimaging

## 2021-09-12 VITALS — BP 143/64 | HR 58 | Ht 61.5 in | Wt 197.2 lb

## 2021-09-12 DIAGNOSIS — M542 Cervicalgia: Secondary | ICD-10-CM

## 2021-09-12 DIAGNOSIS — R251 Tremor, unspecified: Secondary | ICD-10-CM

## 2021-09-12 NOTE — Telephone Encounter (Signed)
Will forward to Jenna E RN with Dr Hilty 

## 2021-09-12 NOTE — Telephone Encounter (Signed)
Pt c/o medication issue:  1. Name of Medication: icosapent Ethyl (VASCEPA) 1 g capsule  2. How are you currently taking this medication (dosage and times per day)? One capsule twice daily, once in the morning and once in the evening  3. Are you having a reaction (difficulty breathing--STAT)? No   4. What is your medication issue? Patient is calling stating her pharmacy advised that she is needing PA for this medication for medical necessity. Please advise.

## 2021-09-12 NOTE — Progress Notes (Signed)
[facility]  PATIENT: [name] DOB: 08-03-1950  REFERRING CLINICIAN: Copland, [name], MD  HISTORY FROM: patient and husband REASON FOR VISIT: new consult   HISTORICAL  CHIEF COMPLAINT:  Chief Complaint  Patient presents with   Numbness and tingling    Rm 6 Est pt, new issue, husband - [name]  "tremors, numbness/tingling/like a shock on my left side; body shaking all over twice"    HISTORY OF PRESENT ILLNESS:   UPDATE (09/12/21, VRP): Since last visit, doing well until April 2023. Then intermittent tremors, neck pain, aphasia and stuttering. Had more sig events in June 2023, leading to ER eval. Now doing a little better. Has been under more stress lately. Also saw sports med for neck pain issues.   UPDATE (08/25/17, VRP): Since last visit, doing well. Dizziness, lightheadedness have improved but not fully resolved. Seems to be affected with overthinking, anxiety and rapid movements. Does better when she is focused on other activities. Walking is better. Driving on limited basis now. Overall doing well.   UPDATE (01/08/17, VRP): Since last visit, was doing well until Aug 15, 2016 --> fell and hit head and broke right knee. Now with headaches, anxiety, nausea issues. Now on prozac and feeling a little better.  UPDATE 09/09/14: Since last visit, doing better with memory and sleep apnea treatments. Having more issues with left foot/toe drop and back pain --> has MRI lumbar spine setup via ortho for reevaluation of known lumbar radiculopathy.   UPDATE 03/01/14: Since last visit, feeling better. Still with some stress, memory loss, but getting better. Had sleep study and dx'd with sleep apnea, planning to have titration study in Jan 2016 for CPAP.  PRIOR HPI (12/20/13): 71 year old right-handed female here for a list of memory loss. Patient noticed memory problems since Jan 31, 2013. Husband noticed problems since February 2015. Patient having short-term memory  problems, poor concentration attention, in the setting of significantly increased psychosocial stressors. She has had more trouble driving and cooking recently.  Patient was taking care of her brother for the past one year who was diagnosed with pancreatic cancer. He deteriorated and ultimately passed away in January 31, 2014. Patient placed some blame on herself for his death, related to recommending morphine drip. During his funeral, patient stepped into a hole in the ground and developed left foot, ankle injury and plantar fascia rupture. Related to this patient was unable to work. She had to have premature retirement. This contributed to increasing stress, depression and anxiety. Patient also had a fall [place] the beats with head trauma, low back pain and disc bulging. Patient had lumbar spine surgery, L4, L5, S1 microdecompression 2014. Patient has family history of dementia in her mother and father.    REVIEW OF SYSTEMS: Full 14 system review of systems performed and negative except: memory loss sleepiness blurred vision tremor.     ALLERGIES: Allergies  Allergen Reactions   Crestor [Rosuvastatin] Other (See Comments)    myalgia   Niacin And Related Other (See Comments)    Whelps and skin flushed, mouth tingling    HOME MEDICATIONS: Outpatient Medications Prior to Visit  Medication Sig Dispense Refill   amLODipine (NORVASC) 10 MG tablet Take 1 tablet by mouth daily. 90 tablet 1   aspirin EC 81 MG EC tablet Take 1 tablet (81 mg total) by mouth daily. 30 tablet 1   chlorpheniramine (CHLOR-TRIMETON) 4 MG tablet Take 4 mg by mouth daily.     cholecalciferol (VITAMIN D) 1000 UNITS tablet  Take 1,000 Units by mouth daily.     clopidogrel (PLAVIX) 75 MG tablet TAKE 1 TABLET BY MOUTH DAILY. (Patient taking differently: Take 75 mg by mouth daily.) 90 tablet 3   cyclobenzaprine (FLEXERIL) 10 MG tablet Take 1 tablet (10 mg total) by mouth 2 (two) times daily as needed for muscle spasms. 20 tablet 0    denosumab (PROLIA) 60 MG/ML SOSY injection Inject 60 mg into the skin every 6 (six) months.     diazepam (VALIUM) 2 MG tablet Take 1 tablet (2 mg total) by mouth every 12 (twelve) hours as needed for anxiety or muscle spasms. 15 tablet 0   ezetimibe (ZETIA) 10 MG tablet TAKE 1 TABLET BY MOUTH ONCE A DAY (Patient taking differently: Take 10 mg by mouth daily.) 90 tablet 3   fenofibrate (TRICOR) 145 MG tablet Take 1 tablet (145 mg total) by mouth daily. 90 tablet 3   FLUoxetine (PROZAC) 20 MG capsule TAKE 1 CAPSULE BY MOUTH ONCE DAILY (Patient taking differently: Take 20 mg by mouth daily.) 90 capsule 3   fluticasone (FLONASE) 50 MCG/ACT nasal spray Place 1 spray into both nostrils daily as needed for allergies or rhinitis. 16 g 11   HYDROcodone-acetaminophen (NORCO/VICODIN) 5-325 MG tablet Take 1 tablet by mouth every 8 (eight) hours as needed. 10 tablet 0   icosapent Ethyl (VASCEPA) 1 g capsule TAKE 2 CAPSULES BY MOUTH TWICE DAILY (Patient taking differently: Take 2 g by mouth 2 (two) times daily.) 360 capsule 3   isosorbide mononitrate (IMDUR) 30 MG 24 hr tablet Take 1 tablet (30 mg total) by mouth daily. 90 tablet 2   metFORMIN (GLUCOPHAGE-XR) 500 MG 24 hr tablet Take 1 tablet  by mouth 2  times daily. (Patient taking differently: Take 500 mg by mouth daily with breakfast.) 180 tablet 3   metoprolol succinate (TOPROL-XL) 25 MG 24 hr tablet TAKE 1/2 OF A TABLET BY MOUTH DAILY. (Patient taking differently: Take 12.5 mg by mouth daily.) 45 tablet 1   Multiple Vitamin (MULTIVITAMIN WITH MINERALS) TABS tablet Take 1 tablet by mouth daily.     nitroGLYCERIN (NITROSTAT) 0.4 MG SL tablet Place 1 tablet under the tongue every 5 minutes as needed for chest pain. 25 tablet 2   RABEprazole (ACIPHEX) 20 MG tablet Take 1 tablet (20 mg total) by mouth daily 30 minutes prior to food 90 tablet 3   simvastatin (ZOCOR) 40 MG tablet Take 1 tablet (40 mg total) by mouth daily. 90 tablet 3   telmisartan (MICARDIS) 40 MG  tablet TAKE 1 TABLET BY MOUTH EVERY MORNING * NEEDS OFFICE VISIT (Patient taking differently: Take 40 mg by mouth daily.) 90 tablet 2   cyclobenzaprine (FLEXERIL) 10 MG tablet TAKE 1 TABLET BY MOUTH TWICE DAILY AS NEEDED FOR MUSCLE SPASMS (Patient taking differently: Take 10 mg by mouth 2 (two) times daily as needed for muscle spasms.) 30 tablet 2   diazepam (VALIUM) 2 MG tablet Take 1 tablet (2 mg total) by mouth every 8 (eight) hours as needed for muscle spasms. 30 tablet 2   No facility-administered medications prior to visit.    PAST MEDICAL HISTORY: Past Medical History:  Diagnosis Date   Anemia    Arthritis    Bronchitis    hx of    CAD (coronary artery disease)    a. s/p DES to LAD in 2010 with patent stent by cath in 2014 b. low-risk NST in 11/2016   Cataracts, bilateral    Diabetes mellitus without complication (  HCC)    Family history of adverse reaction to anesthesia    pts mother had difficulty awakening    GERD (gastroesophageal reflux disease)    Hyperlipidemia LDL goal < 70    Hypertension    Lumbar back pain    Numbness    left leg and foot    Plantar fascia rupture    Left Foot   Sleep apnea    uses CPAP     PAST SURGICAL HISTORY: Past Surgical History:  Procedure Laterality Date   ABDOMINAL HYSTERECTOMY     BACK SURGERY     BREAST CYST ASPIRATION  1995   CAROTID STENT  2009   pt denies    CORONARY ANGIOPLASTY WITH STENT PLACEMENT  2010and 10-02-2012   Stent DES, Xience to prox. LAD   DOPPLER ECHOCARDIOGRAPHY  08/01/2009   EF=>55%,LV normal   LEFT HEART CATH AND CORONARY ANGIOGRAPHY N/A 12/10/2019   Procedure: LEFT HEART CATH AND CORONARY ANGIOGRAPHY;  Surgeon: Lyn Records, MD;  Location: [facility];  Service: Cardiovascular;  Laterality: N/A;   LEFT HEART CATH AND CORONARY ANGIOGRAPHY N/A 10/30/2020   Procedure: LEFT HEART CATH AND CORONARY ANGIOGRAPHY;  Surgeon: Runell Gess, MD;  Location: [facility];  Service: Cardiovascular;   Laterality: N/A;   LEFT HEART CATHETERIZATION WITH CORONARY ANGIOGRAM N/A 10/02/2012   Procedure: LEFT HEART CATHETERIZATION WITH CORONARY ANGIOGRAM;  Surgeon: Runell Gess, MD;  Location: [facility] CATH LAB;  Service: Cardiovascular;  Laterality: N/A;   lower arterial duplex  06/20/10   abi's normal,rgt 0.98,lft 1.06;bilateral PVRs normal   LUMBAR LAMINECTOMY/DECOMPRESSION MICRODISCECTOMY Left 01/06/2013   Procedure: MICRO LUMBAR DECOMPRESSION L4-5 AND L5-S1;  Surgeon: Javier Docker, MD;  Location: [facility];  Service: Orthopedics;  Laterality: Left;   LUMBAR LAMINECTOMY/DECOMPRESSION MICRODISCECTOMY Left 10/12/2014   Procedure: REVISION MICRO LUMBAR/DECOMPRESSION L4-5 LEFT ;  Surgeon: Jene Every, MD;  Location: [facility];  Service: Orthopedics;  Laterality: Left;   NM MYOCAR PERF WALL MOTION  09/22/2008   lexiscan-EF 83%; glogal LV systolic fx is norm. ,evidence of mild ischemia basal anterior,midanterior and apical lateral region(s).    ORIF PATELLA Right 08/29/2016   Procedure: OPEN REDUCTION INTERNAL (ORIF) FIXATION RIGHT PATELLA;  Surgeon: Samson Frederic, MD;  Location: [facility];  Service: Orthopedics;  Laterality: Right;  Adductor Block   TUBAL LIGATION     TYMPANOPLASTY Bilateral    UVULOPALATOPHARYNGOPLASTY     pt denies     FAMILY HISTORY: Family History  Problem Relation Age of Onset   Coronary artery disease Mother    Rheum arthritis Mother    Dementia Mother    Heart attack Father    Hypertension Father    Hyperlipidemia Father    Other Father        MVA   Hypertension Brother    Cancer Brother    Heart disease Brother    Cancer Paternal Grandmother        stomach   Diabetes Paternal Grandfather    Breast cancer Neg Hx     SOCIAL HISTORY:  Social History   Socioeconomic History   Marital status: Married    Spouse name: [name]   Number of children: 2   Years of education: MSN   Highest education level: Not on file  Occupational History   Occupation: DIRECTOR     Employer: [employer]  Tobacco Use   Smoking status: Never   Smokeless tobacco: Never  Vaping Use   Vaping Use: Never  used  Substance and Sexual Activity   Alcohol use: Yes    Comment: occasional, 1 a month wine   Drug use: No   Sexual activity: Yes  Other Topics Concern   Not on file  Social History Narrative   Married to [place]. Education: [employer]. Exercise: Yes   Patient lives at home with her spouse. retired   Caffeine use: 1 drink of tea a day   Social Determinants of Health   Financial Resource Strain: Medium Risk (08/14/2021)   Overall Financial Resource Strain (CARDIA)    Difficulty of Paying Living Expenses: Somewhat hard  Food Insecurity: Not on file  Transportation Needs: Not on file  Physical Activity: Insufficiently Active (09/27/2020)   Exercise Vital Sign    Days of Exercise per Week: 3 days    Minutes of Exercise per Session: 30 min  Stress: Not on file  Social Connections: Not on file  Intimate Partner Violence: Not on file     PHYSICAL EXAM   GENERAL EXAM/CONSTITUTIONAL: Vitals:  Vitals:   09/12/21 0948  BP: (!) 143/64  Pulse: (!) 58  Weight: 197 lb 3.2 oz (89.4 kg)  Height: 5' 1.5" (1.562 m)   Body mass index is 36.66 kg/m. Wt Readings from Last 3 Encounters:  09/12/21 197 lb 3.2 oz (89.4 kg)  09/04/21 198 lb (89.8 kg)  09/04/21 198 lb (89.8 kg)   Patient is in no distress; well developed, nourished and groomed; neck HAS  supple  CARDIOVASCULAR: Examination of carotid arteries is normal; no carotid bruits Regular rate and rhythm, no murmurs Examination of peripheral vascular system by observation and palpation is normal  EYES: Ophthalmoscopic exam of optic discs and posterior segments is normal; no papilledema or hemorrhages No results found.  MUSCULOSKELETAL: Gait, strength, tone, movements noted in Neurologic exam below  NEUROLOGIC: MENTAL STATUS:     12/20/2013   10:44 AM  MMSE - Mini Mental State Exam  Orientation to time  5  Orientation to Place 5  Registration 3  Attention/ Calculation 5  Recall 3  Language- name 2 objects 2  Language- repeat 1  Language- follow 3 step command 3  Language- read & follow direction 1  Write a sentence 1  Copy design 1  Total score 30   awake, alert, oriented to person, place and time recent and remote memory intact normal attention and concentration language fluent, comprehension intact, naming intact fund of knowledge appropriate  CRANIAL NERVE:  2nd - no papilledema on fundoscopic exam 2nd, 3rd, 4th, 6th - pupils equal and reactive to light, visual fields full to confrontation, extraocular muscles intact, no nystagmus 5th - facial sensation symmetric 7th - facial strength symmetric 8th - hearing intact 9th - palate elevates symmetrically, uvula midline 11th - shoulder shrug symmetric 12th - tongue protrusion midline  MOTOR:  normal bulk and tone, full strength in the BUE, BLE  SENSORY:  normal and symmetric to light touch, temperature, vibration  COORDINATION:  finger-nose-finger, fine finger movements normal  REFLEXES:  deep tendon reflexes TRACE and symmetric  GAIT/STATION:  narrow based gait    DIAGNOSTIC DATA (LABS, IMAGING, TESTING) - I reviewed patient records, labs, notes, testing and imaging myself where available.  Lab Results  Component Value Date   WBC 12.8 (H) 09/04/2021   HGB 12.1 09/04/2021   HCT 37.8 09/04/2021   MCV 80.3 09/04/2021   PLT 371 09/04/2021      Component Value Date/Time   NA 139 09/04/2021 1250   K 3.9  09/04/2021 1250   CL 106 09/04/2021 1250   CO2 24 09/04/2021 1250   GLUCOSE 125 (H) 09/04/2021 1250   BUN 20 09/04/2021 1250   CREATININE 0.98 09/04/2021 1250   CREATININE 0.82 09/29/2014 0910   CALCIUM 9.5 09/04/2021 1250   PROT 7.3 09/04/2021 1250   PROT 6.6 05/17/2020 0940   ALBUMIN 4.1 09/04/2021 1250   ALBUMIN 4.2 05/17/2020 0940   AST 26 09/04/2021 1250   ALT 24 09/04/2021 1250   ALKPHOS 45  09/04/2021 1250   BILITOT 0.5 09/04/2021 1250   BILITOT 0.3 05/17/2020 0940   GFRNONAA >60 09/04/2021 1250   GFRAA >60 11/18/2019 1418   Lab Results  Component Value Date   CHOL 143 05/31/2021   HDL 37 (L) 05/31/2021   LDLCALC 84 05/31/2021   LDLDIRECT 82.0 11/16/2018   TRIG 124 05/31/2021   CHOLHDL 3.9 05/31/2021   Lab Results  Component Value Date   HGBA1C 7.9 (H) 06/27/2021   Lab Results  Component Value Date   VITAMINB12 924 12/20/2013   Lab Results  Component Value Date   TSH 3.29 06/27/2021    I reviewed images myself and agree with interpretation. -VRP  12/07/12 CT head - Negative head CT.  12/07/12 CT cervical spine - No acute bony injury in the cervical spine.  12/29/13 MRI brain 1. Few subcortical and juxtacortical foci of non-specific gliosis, likely chronic small vessel ischemic disease.    2. No acute findings.  12/28/16 MRI brain  - No cause of the presenting symptoms is identified. No post traumatic finding. Normal except for a few punctate foci of T2 and FLAIR signal within the hemispheric white matter, probably subclinical.    ASSESSMENT AND PLAN  71 y.o. year old female here with short term memory loss, poor attention/focus, since Nov 2014, in setting of increased personal stress (brother's illness and death, left foot injury, inability to work and premature retirement).   Then had fall and head injury in May 2018, with mild concussion and now post-concussion syndrome.   Then had neck pain, tremors, abnl spell since April-June 2023.   Dx:   1. Tremor   2. Neck pain      PLAN:  TREMOR, NECK PAIN, APHASIA, STUTTERING - unclear etiology; suspect stress reaction, based on witnessed event in ER by neurohospitalist, and by husband description; has been having more stress lately - MRI brain unremarkable - follow up with neck pain issues with sports medicine - continue to gradually increase activity - reviewed nutrition, fitness, sleep and  relaxation techniques - follow up with PCP re: anxiety; consider psychology follow up  Return for return to PCP, pending if symptoms worsen or fail to improve.  I spent 40 minutes of face-to-face and non-face-to-face time with patient.  This included previsit chart review, lab review, study review, order entry, electronic health record documentation, patient education.     Suanne Marker, MD 09/12/2021, 10:51 AM Certified in Neurology, Neurophysiology and Neuroimaging  [facility] [address] [place], [place] [address] [phone]

## 2021-09-13 DIAGNOSIS — G4733 Obstructive sleep apnea (adult) (pediatric): Secondary | ICD-10-CM | POA: Diagnosis not present

## 2021-09-17 ENCOUNTER — Other Ambulatory Visit (HOSPITAL_COMMUNITY): Payer: Self-pay

## 2021-09-17 NOTE — Telephone Encounter (Signed)
**Note De-Identified [name] Obfuscation** Icosapent PA started through covermymeds. KeyBradley Simpson

## 2021-09-18 ENCOUNTER — Other Ambulatory Visit (HOSPITAL_COMMUNITY): Payer: Self-pay

## 2021-09-19 ENCOUNTER — Other Ambulatory Visit (HOSPITAL_COMMUNITY): Payer: Self-pay

## 2021-09-20 ENCOUNTER — Other Ambulatory Visit: Payer: Self-pay | Admitting: Family Medicine

## 2021-09-20 ENCOUNTER — Other Ambulatory Visit (HOSPITAL_COMMUNITY): Payer: Self-pay

## 2021-09-20 ENCOUNTER — Other Ambulatory Visit (HOSPITAL_COMMUNITY): Payer: Self-pay | Admitting: Emergency Medicine

## 2021-09-21 ENCOUNTER — Encounter: Payer: Self-pay | Admitting: Family Medicine

## 2021-09-21 ENCOUNTER — Other Ambulatory Visit (HOSPITAL_COMMUNITY): Payer: Self-pay

## 2021-09-21 MED ORDER — CHLORPHENIRAMINE MALEATE 4 MG PO TABS
4.0000 mg | ORAL_TABLET | Freq: Every day | ORAL | 3 refills | Status: DC
Start: 2021-09-21 — End: 2023-11-12
  Filled 2021-09-21 – 2022-03-11 (×5): qty 14, 14d supply, fill #0

## 2021-09-21 MED ORDER — HYDROCODONE-ACETAMINOPHEN 5-325 MG PO TABS
1.0000 | ORAL_TABLET | Freq: Three times a day (TID) | ORAL | 0 refills | Status: DC | PRN
  Filled 2021-09-21: qty 10, 4d supply, fill #0

## 2021-09-30 NOTE — Telephone Encounter (Signed)
Pt ready for scheduling on or after 10/22/21  Out-of-pocket cost due at time of visit: $276  Primary: [employer] Prolia co-insurance: 20% (approximately $276) Admin fee co-insurance: $0  Secondary: n/a Prolia co-insurance:  Admin fee co-insurance:   Deductible: does not apply  Prior Auth: not required PA# Valid:   ** This summary of benefits is an estimation of the patient's out-of-pocket cost. Exact cost may vary based on individual plan coverage.

## 2021-10-02 ENCOUNTER — Other Ambulatory Visit (HOSPITAL_COMMUNITY): Payer: Self-pay

## 2021-10-02 NOTE — Telephone Encounter (Signed)
Left vm to return call.    

## 2021-10-10 ENCOUNTER — Telehealth: Payer: Self-pay | Admitting: Family Medicine

## 2021-10-10 NOTE — Telephone Encounter (Signed)
Left message for patient to call back and schedule Medicare Annual Wellness Visit (AWV).  ? ?Please offer to do virtually or by telephone.  Left office number and my jabber #[phone]. ? ?AWVI eligible as of 09/23/2015 ? ?Please schedule at anytime with Nurse Health Advisor. ?  ?

## 2021-10-18 ENCOUNTER — Telehealth: Payer: PPO

## 2021-10-18 ENCOUNTER — Other Ambulatory Visit (HOSPITAL_COMMUNITY): Payer: Self-pay

## 2021-10-18 ENCOUNTER — Other Ambulatory Visit: Payer: Self-pay | Admitting: Internal Medicine

## 2021-10-18 MED ORDER — FENOFIBRATE 145 MG PO TABS
145.0000 mg | ORAL_TABLET | Freq: Every day | ORAL | 3 refills | Status: DC
  Filled 2021-10-18: qty 90, 90d supply, fill #0
  Filled 2021-12-10 – 2022-01-15 (×2): qty 90, 90d supply, fill #1
  Filled 2022-03-10 – 2022-04-03 (×2): qty 90, 90d supply, fill #2
  Filled 2022-08-12: qty 90, 90d supply, fill #3

## 2021-10-24 ENCOUNTER — Telehealth: Payer: PPO

## 2021-10-25 ENCOUNTER — Telehealth: Payer: PPO

## 2021-10-26 ENCOUNTER — Ambulatory Visit (INDEPENDENT_AMBULATORY_CARE_PROVIDER_SITE_OTHER): Payer: PPO | Admitting: Pharmacist

## 2021-10-26 ENCOUNTER — Other Ambulatory Visit (HOSPITAL_COMMUNITY): Payer: Self-pay

## 2021-10-26 DIAGNOSIS — I2510 Atherosclerotic heart disease of native coronary artery without angina pectoris: Secondary | ICD-10-CM

## 2021-10-26 DIAGNOSIS — I10 Essential (primary) hypertension: Secondary | ICD-10-CM

## 2021-10-26 DIAGNOSIS — E118 Type 2 diabetes mellitus with unspecified complications: Secondary | ICD-10-CM

## 2021-10-26 DIAGNOSIS — E785 Hyperlipidemia, unspecified: Secondary | ICD-10-CM

## 2021-10-26 DIAGNOSIS — E119 Type 2 diabetes mellitus without complications: Secondary | ICD-10-CM

## 2021-10-26 NOTE — Patient Instructions (Signed)
Mrs. Brickey It was a pleasure speaking with you today.  Below is a summary of your health goals and our recent visit. You can also view your updated Chronic Care Management Care plan through your MyChart account.   Patient Goals/Self-Care Activities take medications as prescribed check blood pressure 3 to 4 times per week, document, and provide at future appointments Continue Prolia - next appointment is 10/30/2021 at 9:15am   As always if you have any questions or concerns especially regarding medications, please feel free to contact me either at the phone number below or with a MyChart message.   Keep up the good work!  Henrene Pastor, PharmD Clinical Pharmacist [facility] [phone] (direct line)  [phone] (main office number)   Patient verbalizes understanding of instructions and care plan provided today and agrees to view in MyChart. Active MyChart status and patient understanding of how to access instructions and care plan via MyChart confirmed with patient.

## 2021-10-26 NOTE — Chronic Care Management (AMB) (Signed)
Chronic Care Management Pharmacy Note  10/26/2021 Name:  [name] MRN:  [id number] DOB:  03/14/1951  Summary:  Diabetes: Last A1c was 7.9% - Dr Lorelei Pont recommended patient increase metformin Er to 574m twice a day however patient decrease back to 5075mdaily because she symptoms of low blood glucose when she took higher dose. She said she felt blood glucose was low but when she checked was 120. Reports blood glucose usually 110 to 160. She has changed diet significantly - eating more salads and fewer potatoes and sweets. She has lost about 15 labs since last OV. Consider metformin ER 75011mr 850m50mily in future is A1c still elelvated at next visit with PCP>  Mixed hyperlipidemia - LDL improving but at goal of < 70; Managed by Dr HiltDebara Pickettrdiology. [facility] medications through 03/2022. Osteopenia with high fracture risk - started Prolia January 2023. Cost was $295. Checked to see if post menopausal osteoporosis [facility] but no current funding available for osteoporosis. Will continue to monitor. Reminded patient of appointment of Prolia 60mg66m 10/30/2021.  Discussed adherence and reviewed recent refill records.  Patient thought that [facility] but they do not do auto refills. Last amlodipine and simvastatin Rx was filled 07/03/2021 for 90 days. Assisted in requesting RF for simvastatin and [facility]lodipine from [facility]'s permission.    Subjective: [name] 71 y.71 year old female who is a primary patient of Copland, JessiGay Filler  The CCM team was consulted for assistance with disease management and care coordination needs.    Engaged with patient by telephone for follow up visit in response to provider referral for pharmacy case management and/or care coordination services.   Consent to Services:  The patient was given  information about Chronic Care Management services, agreed to services, and gave verbal consent prior to initiation of services.  Please see initial visit note for detailed documentation.   Patient Care Team: Copland, JessiGay Filleras PCP - General (Family Medicine) HiltyDebara PicketteNadean Corwinas PCP - Cardiology (Cardiology) EckarCherre Robins-CPP (Pharmacist)  Recent office visits: 08/16/2021 Int Med (MurrValere Dross Seen for neck pain and muscle spasms. Referred to sports medicine. Recommended heat and rest.  06/27/2021 - Fam Med (Dr CoplaLorelei Pontlow up chronic conditions and arthritis in right hand. Noted increase in A1c - increased metformin from ER 500mg 36mly to twice a day 04/23/2021 - Fam Med (Dr CoplanLorelei Pontdiabetes and other chronic conditions. Received Prolia injection.   Recent consult visits: 09/12/2021 - Neurology (Dr PennumCorwin Levins for tremor, neck pain, aphasia and stuttering. MRI unremarkable. Suspect stress reaction. Recommended follow up with Sports Med for neck pain. Follow up with PCP regarding stress. No med changes noted.  08/30/2021 - Sports Med (Dr SchmitRaeford Razor for OA of right knee. Received steroid injection in knee.  08/16/2021 - Sports Med (Dr SchmitRaeford Razor for cervial radiculopathy. Prescrived hydrocodone/APAP 5/325mg e14m 8 hours. Also received trigger point steroid injection.  06/05/2021 - Cardio (Dr Hilty) Debara Pickettfor f/u CAD and hyperlipidemia.    [facility]: [facility] Pres[facility]rived diazepam and cyclobenzeprine. Referred to neurology.  [facility]. CT and MRI ordered. Both were normal. No med changes noted  Objective:  Lab Results  Component Value Date   CREATININE 0.98 09/04/2021   CREATININE 1.10 (H) 09/03/2021   CREATININE 1.23 (H) 09/03/2021    Lab Results  Component Value Date   HGBA1C 7.9 (H) 06/27/2021   Last diabetic Eye  exam:  Lab Results  Component Value Date/Time   HMDIABEYEEXA No Retinopathy 07/27/2015 09:06 AM    Last diabetic Foot exam: No results found for: "HMDIABFOOTEX"      Component Value Date/Time   CHOL 143 05/31/2021 0912   TRIG 124 05/31/2021 0912   HDL 37 (L) 05/31/2021 0912   CHOLHDL 3.9 05/31/2021 0912   CHOLHDL 6.3 (H) 12/20/2019 0945   VLDL 30.8 07/01/2019 0921   LDLCALC 84 05/31/2021 0912   LDLCALC  12/20/2019 0945     Comment:     . LDL cholesterol not calculated. Triglyceride levels greater than 400 mg/dL invalidate calculated LDL results. . Reference range: <100 . Desirable range <100 mg/dL for primary prevention;   <70 mg/dL for patients with CHD or diabetic patients  with > or = 2 CHD risk factors. Marland Kitchen LDL-C is now calculated using the Martin-Hopkins  calculation, which is a validated novel method providing  better accuracy than the Friedewald equation in the  estimation of LDL-C.  Cresenciano Genre et al. Annamaria Helling. 0370;488(89): 2061-2068  (http://education.[employer].com/faq/FAQ164)    LDLDIRECT 82.0 11/16/2018 1010       Latest Ref Rng & Units 09/04/2021   12:50 PM 09/03/2021    1:49 PM 04/23/2021    2:14 PM  Hepatic Function  Total Protein 6.5 - 8.1 g/dL 7.3  6.6  6.6   Albumin 3.5 - 5.0 g/dL 4.1  4.0  4.3   AST 15 - 41 U/L _0 ALT 0 - 44 U/L _1 Alk Phosphatase 38 - 126 U/L 45  46  61   Total Bilirubin 0.3 - 1.2 mg/dL 0.5  0.5  0.4     Lab Results  Component Value Date/Time   TSH 3.29 06/27/2021 08:37 AM   TSH 2.35 07/01/2019 09:21 AM       Latest Ref Rng & Units 09/04/2021   12:50 PM 09/03/2021    1:55 PM 09/03/2021    1:49 PM  CBC  WBC 4.0 - 10.5 K/uL 12.8   14.7   Hemoglobin 12.0 - 15.0 g/dL 12.1  13.3  11.9   Hematocrit 36.0 - 46.0 % 37.8  39.0  37.4   Platelets 150 - 400 K/uL 371   376     Lab Results  Component Value Date/Time   VD25OH 39.17 07/18/2016 08:53 AM    Clinical ASCVD: Yes  The ASCVD Risk score (Arnett DK,  et al., 2019) failed to calculate for the following reasons:   The patient has a prior MI or stroke diagnosis    DEXA 02/12/2019 The BMD measured at Femur Neck Right is 0.764 g/cm2 with a T-score of -2.0.    There has been a statistically significant decrease in BMD of Lumbar spine, and no change in BMD of Total Mean hip since prior exam dated 11/10/2015. The scan quality is good. L-3 and L-4 were excluded due to degenerative changes.   Site Region Measured Date Measured Age YA BMD Significant CHANGE T-score   DualFemur Neck Right 02/02/2019       -2.0     DualFemur Neck Right 11/10/2015       -1.8       AP  Spine L1-L2 02/02/2019  -1.3  AP Spine  L1-L2 08/18/201   -0.8       DualFemur Total Mean 02/02/2019     -0.7     DualFemur Total Mean 11/10/2015     -0.9       Low bone mass and a 10 year probability of a hip fracture is 3.3% and probability of a major osteoporosis-related fracture 16.5%  Social History   Tobacco Use  Smoking Status Never  Smokeless Tobacco Never   BP Readings from Last 3 Encounters:  09/12/21 (!) 143/64  09/04/21 (!) 124/53  09/03/21 121/67   Pulse Readings from Last 3 Encounters:  09/12/21 (!) 58  09/04/21 (!) 53  09/03/21 60   Wt Readings from Last 3 Encounters:  09/12/21 197 lb 3.2 oz (89.4 kg)  09/04/21 198 lb (89.8 kg)  09/04/21 198 lb (89.8 kg)    Assessment: Review of patient past medical history, allergies, medications, health status, including review of consultants reports, laboratory and other test data, was performed as part of comprehensive evaluation and provision of chronic care management services.   SDOH:  (Social Determinants of Health) assessments and interventions performed:     CCM Care Plan  Allergies  Allergen Reactions   Crestor [Rosuvastatin] Other (See Comments)    myalgia   Niacin And Related Other (See Comments)    Whelps and skin flushed, mouth tingling    Medications Reviewed Today     Reviewed by  Penni Bombard, MD (Physician) on 09/12/21 at 49  Med List Status: <None>   Medication Order Taking? Sig Documenting Provider Last Dose Status Informant  amLODipine (NORVASC) 10 MG tablet [id number] Yes Take 1 tablet by mouth daily. Copland, Gay Filler, MD Taking Active Self, Spouse/Significant Other  aspirin EC 81 MG EC tablet [id number] Yes Take 1 tablet (81 mg total) by mouth daily. Rod Can, MD Taking Active Self, Spouse/Significant Other           Med Note Antony Contras, Masa Lubin B   Tue Feb 13, 2021  9:18 AM)    chlorpheniramine (CHLOR-TRIMETON) 4 MG tablet [id number] Yes Take 4 mg by mouth daily. [provider] Taking Active Self, Spouse/Significant Other           Med Note Mickie Bail Sep 03, 2021  2:23 PM)    cholecalciferol (VITAMIN D) 1000 UNITS tablet [id number] Yes Take 1,000 Units by mouth daily. [provider] Taking Active Self, Spouse/Significant Other  clopidogrel (PLAVIX) 75 MG tablet [id number] Yes TAKE 1 TABLET BY MOUTH DAILY.  Patient taking differently: Take 75 mg by mouth daily.   Pixie Casino, MD Taking Active Self, Spouse/Significant Other  cyclobenzaprine (FLEXERIL) 10 MG tablet [id number] Yes Take 1 tablet (10 mg total) by mouth 2 (two) times daily as needed for muscle spasms. Gareth Morgan, MD Taking Active   denosumab (PROLIA) 60 MG/ML SOSY injection [id number] Yes Inject 60 mg into the skin every 6 (six) months. [provider] Taking Active   diazepam (VALIUM) 2 MG tablet [id number] Yes Take 1 tablet (2 mg total) by mouth every 12 (twelve) hours as needed for anxiety or muscle spasms. Gareth Morgan, MD Taking Active   ezetimibe (ZETIA) 10 MG tablet [id number] Yes TAKE 1 TABLET BY MOUTH ONCE A DAY  Patient taking differently: Take 10 mg by mouth daily.   Pixie Casino, MD Taking Active Self, Spouse/Significant Other  fenofibrate (TRICOR) 145 MG tablet [id number] Yes Take 1 tablet (145 mg  total) by mouth daily. Pixie Casino, MD Taking Active Self, Spouse/Significant Other  FLUoxetine (PROZAC) 20 MG capsule [id number] Yes TAKE 1 CAPSULE BY MOUTH ONCE DAILY  Patient taking differently: Take 20 mg by mouth daily.   Copland, Gay Filler, MD Taking Active Self, Spouse/Significant Other  fluticasone (FLONASE) 50 MCG/ACT nasal spray [id number] Yes Place 1 spray into both nostrils daily as needed for allergies or rhinitis. Copland, Gay Filler, MD Taking Active Self, Spouse/Significant Other  HYDROcodone-acetaminophen (NORCO/VICODIN) 5-325 MG tablet [id number] Yes Take 1 tablet by mouth every 8 (eight) hours as needed. Rosemarie Ax, MD Taking Active Self, Spouse/Significant Other  icosapent Ethyl (VASCEPA) 1 g capsule [id number] Yes TAKE 2 CAPSULES BY MOUTH TWICE DAILY  Patient taking differently: Take 2 g by mouth 2 (two) times daily.   Pixie Casino, MD Taking Active Self, Spouse/Significant Other  isosorbide mononitrate (IMDUR) 30 MG 24 hr tablet [id number] Yes Take 1 tablet (30 mg total) by mouth daily. Pixie Casino, MD Taking Active Self, Spouse/Significant Other  metFORMIN (GLUCOPHAGE-XR) 500 MG 24 hr tablet [id number] Yes Take 1 tablet  by mouth 2  times daily.  Patient taking differently: Take 500 mg by mouth daily with breakfast.   Copland, Gay Filler, MD Taking Active Self, Spouse/Significant Other  metoprolol succinate (TOPROL-XL) 25 MG 24 hr tablet [id number] Yes TAKE 1/2 OF A TABLET BY MOUTH DAILY.  Patient taking differently: Take 12.5 mg by mouth daily.   Copland, Gay Filler, MD Taking Active Self, Spouse/Significant Other  Multiple Vitamin (MULTIVITAMIN WITH MINERALS) TABS tablet [id number] Yes Take 1 tablet by mouth daily. [provider] Taking Active Self, Spouse/Significant Other  nitroGLYCERIN (NITROSTAT) 0.4 MG SL tablet [id number] Yes Place 1 tablet under the tongue every 5 minutes as needed for chest pain. Copland, Gay Filler, MD Taking Active Self, Spouse/Significant Other  RABEprazole  (ACIPHEX) 20 MG tablet [id number] Yes Take 1 tablet (20 mg total) by mouth daily 30 minutes prior to food Copland, Gay Filler, MD Taking Active Self, Spouse/Significant Other    Discontinued 02/27/20 2121 (Side effect (s))   simvastatin (ZOCOR) 40 MG tablet [id number] Yes Take 1 tablet (40 mg total) by mouth daily. Copland, Gay Filler, MD Taking Active Self, Spouse/Significant Other  telmisartan (MICARDIS) 40 MG tablet [id number] Yes TAKE 1 TABLET BY MOUTH EVERY MORNING * NEEDS OFFICE VISIT  Patient taking differently: Take 40 mg by mouth daily.   Pixie Casino, MD Taking Active Self, Spouse/Significant Other            Patient Active Problem List   Diagnosis Date Noted   Primary osteoarthritis of right knee 08/30/2021   Cervical radiculopathy 08/16/2021   Shortness of breath 01/30/2021   Heartburn 01/30/2021   Chronic rhinitis 01/30/2021   Upper airway cough syndrome 11/09/2020   DOE (dyspnea on exertion) 11/09/2020   AKI (acute kidney injury) ([place]) 10/30/2020   Obstructive sleep apnea treated with continuous positive airway pressure (CPAP) 05/07/2018   Chest pain 01/31/2018   Morbid obesity ([place]) 11/03/2017   Coronary artery disease involving native coronary artery of native heart with unstable angina pectoris ([place]) 11/03/2017   Insomnia 11/03/2017   Inadequate sleep hygiene 11/03/2017   Anxiety 07/21/2017   Dizziness 10/02/2016   Right patella fracture 08/29/2016   Acquired foot deformity, left 07/18/2016   Osteopenia 11/10/2015   HNP (herniated nucleus pulposus), lumbar 10/12/2014   Spinal stenosis at L4-L5 level 10/12/2014   Iron deficiency anemia 07/29/2014   DM (diabetes mellitus) with  complications ([place]) 63/03/6008   Environmental and seasonal allergies 04/28/2014   Spinal stenosis, lumbar region, with neurogenic claudication 01/06/2013   Obesity, morbid, BMI 40.0-49.9 ([place]) 10/20/2012   Chest pain with moderate risk of acute coronary syndrome 10/02/2012   CAD S/P  percutaneous coronary angioplasty    Hyperlipidemia with target LDL less than 70    Essential hypertension 04/16/2012    Immunization History  Administered Date(s) Administered   Fluad Quad(high Dose 65+) 11/16/2018, 12/20/2019, 12/20/2020   Influenza Split 01/11/2016   Influenza,inj,Quad PF,6+ Mos 12/01/2013, 01/18/2015, 01/16/2018   Influenza,inj,quad, With Preservative 01/08/2017   Influenza-Unspecified 01/23/2018   PFIZER Comirnaty(Gray Top)Covid-19 Tri-Sucrose Vaccine 07/27/2020   PFIZER(Purple Top)SARS-COV-2 Vaccination 04/20/2019, 05/13/2019, 12/21/2019   PNEUMOCOCCAL CONJUGATE-20 12/21/2020   Pfizer Covid-19 Vaccine Bivalent Booster 49yr & up 12/20/2020   Pneumococcal Conjugate-13 01/18/2015   Pneumococcal Polysaccharide-23 07/12/2010, 04/21/2017   Td 11/16/2018   Tdap 09/15/2008   Zoster Recombinat (Shingrix) 01/16/2018, 03/23/2018    Conditions to be addressed/monitored: CAD, HTN, HLD, Hypertriglyceridemia, DMII, Anxiety, and obesity  Care Plan : General Pharmacy (Adult)  Updates made by ECherre Robins RPH-CPP since 10/26/2021 12:00 AM     Problem: CHL AMB "PATIENT-SPECIFIC PROBLEM"      Long-Range Goal: Pharmacy Care plan for chronic conditions and medication management   Start Date: 09/27/2020  Recent Progress: On track  Priority: High  Note:   Current Barriers:  Unable to achieve control of hyperlipidemia  Cost of therapy for mixed hyperlipidemia high (icosapent ethyl / Vascepa)   Pharmacist Clinical Goal(s):  Over the next 90 days, patient will verbalize ability to afford treatment regimen (icosapent ethyl / Vascepa)  achieve adherence to monitoring guidelines and medication adherence to achieve therapeutic efficacy achieve control of hyperlipidemia as evidenced by LDL <70 maintain control of HTN and type 2 DM as evidenced by BP <140/90 and A1c < 7.2%  through collaboration with PharmD and provider.   Interventions: 1:1 collaboration with Copland, JGay Filler MD regarding development and update of comprehensive plan of care as evidenced by provider attestation and co-signature Inter-disciplinary care team collaboration (see longitudinal plan of care) Comprehensive medication review performed; medication list updated in electronic medical record  Hypertension Controlled; BP goal <130/80 Current regimen:  Telmisartan 438mdaily Metoprolol succinate 2542m/2 tab daily Amlodipine 7m34mily  Home blood pressure readings 130's / 60's Denies dizziness or symptoms of hypotension Interventions: Continue to check blood pressure 3 to 4 times per week and record. Provide at future appointments Ensure daily salt intake < 2300 mg/day Continue to take above hypertensive medications  Hyperlipidemia / CAD:  Not at goals but improving;  LDL goal < 70 and Tg <150 Managed by Dr HiltDebara Pickettardio Current regimen:  Simvastatin 40mg25mly  Vascepa 1g - take 2 capsules twice a day (patient is only able to tolerate 1 capsule twice a day) Ezetimibe 7mg 75my  Fenofibrate 145mg d79m (started 08/18/2020) Clopidogrel 75mg da82m Past medications: rosuvastatin (myalgias); Niacin (flushing and skin rash) Patient reports cost of Vascepa is $100 per refill for 30 days.  [facility] thru 03/2022. Denied recent chest pain; No current myalgias.  Interventions: Continue current therapy for cholesterol lowering  Continue to follow up with Dr Hilty.  Debara Pickettetes Not at goal with recent increase in A1c;  A1c goal <7% Current regimen:  Metformin ER 500mg twi71may (increase 06/2021 - but patient states she can only tolerate once daily) Patient has lost about 34 lbs in  the last 15 months. Following low sugar diet and limiting serving sizes.  Interventions: Discussed importance of diet and exercise Discussed possible therapies for diabetes that might also help with weight loss - Ozempic, Trulicity, Jardinace. (Previous  appointment)  Recommend check blood sugar once daily, document, and provide at future appointments Contact provider with any episodes of hypoglycemia Continue current regimen for diabetes. Could consider change to metformin ER 749m or 8573mdaily.   Depression/Anxiety Controlled Current treatment: Fluoxetine 2035maily  Interventions:  Continue fluoxetine  Osteopenia: Current treatment:  Prolia 54m26mbcutaneously every 6 months.  First Prolia dose was 03/2021. Cost of Prolia was $295 Previously took alendronate but stopped due to cough / reflux.  Last DEXA was 02/20/2021 DualFemur Neck Right 02/20/2021 = -2.0 (previous 02/02/2019 = -2.0 and 11/10/2015 = -1.8)     AP Spine L1-L2 02/20/2021 = -0.0 (previous 02/02/2019  = -1.3 and 11/10/2015 = -0.8)      Low bone mass and a 10 year probability of a hip fracture is 5.4% and probability of a major osteoporosis-related fracture 17.5% Interventions:  Continue Prolia every 6 months.  Currently no copay assistance funds available for Prolia / osteoporosis treatment but will continue to monitor. Discussed fall prevention  Medication management [facility] Inter[facility] Comprehensive medication review performed. Medication list updated to reflect any added or discontinued medications. Continue current medication management strategy Reviewed adherence / refill history. Discussed with patient.   Patient Goals/Self-Care Activities Over the next 90 days, patient will:  take medications as prescribed check blood pressure 3 to 4 times per week, document, and provide at future appointments Continue Prolia every 6 months     Follow Up Plan: Telephone follow up appointment with care management team member scheduled for:  3 months           Medication Assistance: Application for hypercholesterolemia / [facility] assistance program. in process.  Anticipated assistance start  date 05/23/2021.  See plan of care for additional detail.  [email or web address]: [phone]: [phone] Up:  3 months   TammCherre RobinsarmD Clinical Pharmacist [facility]-[phone]

## 2021-10-30 ENCOUNTER — Ambulatory Visit: Payer: PPO

## 2021-10-30 NOTE — Progress Notes (Deleted)
[name] is a 71 y.o. female presents to the office today for Prolia injections, per physician's orders. Prolia 60 mg  SQ (route) was administered *** (location) today. Patient tolerated injection. Patient due for follow up labs/provider appt: No.  Patient next injection due: ***, appt made No- will set in 5 months once new EOB is set.  Laury Axon is DOD  Creft, Feliberto Harts

## 2021-10-31 ENCOUNTER — Ambulatory Visit (INDEPENDENT_AMBULATORY_CARE_PROVIDER_SITE_OTHER): Payer: PPO

## 2021-10-31 DIAGNOSIS — M8580 Other specified disorders of bone density and structure, unspecified site: Secondary | ICD-10-CM

## 2021-10-31 MED ORDER — DENOSUMAB 60 MG/ML ~~LOC~~ SOSY
60.0000 mg | PREFILLED_SYRINGE | Freq: Once | SUBCUTANEOUS | Status: AC
  Administered 2021-10-31: 60 mg via SUBCUTANEOUS

## 2021-10-31 NOTE — Progress Notes (Signed)
Pt here for Prolia shot per Dr. Patsy Lager. Looks like Prolia was approved on 09/30/21 via [facility].  Pt given Prolia shot in the right subcutaneous arm. Pt tolerated well. Pt was given side effects sheet and was advised that she will receive a call closer to six months to schedule next Prolia shot. Pt confirmed understanding.

## 2021-11-06 ENCOUNTER — Other Ambulatory Visit (HOSPITAL_COMMUNITY): Payer: Self-pay

## 2021-11-07 ENCOUNTER — Other Ambulatory Visit (HOSPITAL_COMMUNITY): Payer: Self-pay

## 2021-11-09 ENCOUNTER — Other Ambulatory Visit (HOSPITAL_COMMUNITY): Payer: Self-pay

## 2021-11-14 ENCOUNTER — Other Ambulatory Visit: Payer: Self-pay | Admitting: Family Medicine

## 2021-11-16 ENCOUNTER — Other Ambulatory Visit (HOSPITAL_COMMUNITY): Payer: Self-pay

## 2021-11-22 DIAGNOSIS — Z7984 Long term (current) use of oral hypoglycemic drugs: Secondary | ICD-10-CM | POA: Diagnosis not present

## 2021-11-22 DIAGNOSIS — I10 Essential (primary) hypertension: Secondary | ICD-10-CM

## 2021-11-22 DIAGNOSIS — E1159 Type 2 diabetes mellitus with other circulatory complications: Secondary | ICD-10-CM | POA: Diagnosis not present

## 2021-11-22 DIAGNOSIS — M8580 Other specified disorders of bone density and structure, unspecified site: Secondary | ICD-10-CM

## 2021-11-22 DIAGNOSIS — I2510 Atherosclerotic heart disease of native coronary artery without angina pectoris: Secondary | ICD-10-CM

## 2021-11-22 DIAGNOSIS — E785 Hyperlipidemia, unspecified: Secondary | ICD-10-CM | POA: Diagnosis not present

## 2021-11-28 NOTE — Telephone Encounter (Signed)
Last Prolia inj 10/31/21 Next Prolia inj due 05/04/22 

## 2021-12-05 ENCOUNTER — Other Ambulatory Visit (HOSPITAL_COMMUNITY): Payer: Self-pay

## 2021-12-10 ENCOUNTER — Other Ambulatory Visit (HOSPITAL_COMMUNITY): Payer: Self-pay

## 2021-12-10 ENCOUNTER — Other Ambulatory Visit: Payer: Self-pay | Admitting: Family Medicine

## 2021-12-11 ENCOUNTER — Other Ambulatory Visit (HOSPITAL_COMMUNITY): Payer: Self-pay

## 2021-12-12 ENCOUNTER — Other Ambulatory Visit (HOSPITAL_COMMUNITY): Payer: Self-pay

## 2021-12-18 ENCOUNTER — Other Ambulatory Visit: Payer: Self-pay | Admitting: Family Medicine

## 2021-12-18 ENCOUNTER — Other Ambulatory Visit (HOSPITAL_COMMUNITY): Payer: Self-pay

## 2021-12-18 DIAGNOSIS — F411 Generalized anxiety disorder: Secondary | ICD-10-CM

## 2021-12-18 MED ORDER — FLUOXETINE HCL 20 MG PO CAPS
20.0000 mg | ORAL_CAPSULE | Freq: Every day | ORAL | 0 refills | Status: DC
  Filled 2021-12-18: qty 71, 71d supply, fill #0
  Filled 2021-12-19: qty 19, 19d supply, fill #0

## 2021-12-19 ENCOUNTER — Other Ambulatory Visit (HOSPITAL_COMMUNITY): Payer: Self-pay

## 2021-12-20 ENCOUNTER — Other Ambulatory Visit (HOSPITAL_COMMUNITY): Payer: Self-pay

## 2021-12-22 NOTE — Patient Instructions (Incomplete)
It was good to see you again today, I will be in touch with your labs.  Assuming all is well please see me in about 6 months I recommend getting the newest COVID booster and a dose of RSV this fall Flu shot done today!    I am going to touch base with Dr Leta Baptist- he may want to bring you in to discuss your memory

## 2021-12-22 NOTE — Progress Notes (Unsigned)
New  Healthcare at [facility] [address] [place], [place] [address] [phone]  Date:  12/27/2021   Name:  [name]   DOB:  [birth year]   MRN:  [id number]  PCP:  Pearline Cables, MD    Chief Complaint: No chief complaint on file.   History of Present Illness:  [name] is a 71 y.o. very pleasant female patient who presents with the following:  Patient seen today for periodic follow-up Most recent visit with myself was in April of this year History of hypertension, CAD, diabetes, sleep apnea, obesity, osteopenia with elevated fracture risk on fosamax Status post PCI 2010 and 2014 Last summer she was admitted with chest pain and had a cath, which was negative She had been on Fosamax-stopped by pulmonology, Dr. Sherene Sires in case it was worsening her reflux and shortness of breath She now uses Prolia for her osteopenia  At her most recent visit in April there was some concern of forgetfulness Her A1c had gone up to 7.9, I had her increase metformin  On June 12 she was seen in the ER, concern of stroke symptoms but MRI was negative She returned to the ER the next day with concern of neck pain She was seen by neurology, Dr. Marjory Lies on June 21 regarding neck pain, strokelike symptoms, memory concerns: 1. Tremor   2. Neck pain   PLAN: TREMOR, NECK PAIN, APHASIA, STUTTERING - unclear etiology; suspect stress reaction, based on witnessed event in ER by neurohospitalist, and by husband description; has been having more stress lately - MRI brain unremarkable - follow up with neck pain issues with sports medicine - continue to gradually increase activity - reviewed nutrition, fitness, sleep and relaxation techniques - follow up with PCP re: anxiety; consider psychology follow up  Due for urine microalbumin Recommend COVID Flu shot Update A1c today Labs done in the emergency room in June, CMP, troponin, CBC Lab Results   Component Value Date   HGBA1C 7.9 (H) 06/27/2021    Patient Active Problem List   Diagnosis Date Noted   Primary osteoarthritis of right knee 08/30/2021   Cervical radiculopathy 08/16/2021   Shortness of breath 01/30/2021   Heartburn 01/30/2021   Chronic rhinitis 01/30/2021   Upper airway cough syndrome 11/09/2020   DOE (dyspnea on exertion) 11/09/2020   AKI (acute kidney injury) (HCC) 10/30/2020   Obstructive sleep apnea treated with continuous positive airway pressure (CPAP) 05/07/2018   Chest pain 01/31/2018   Morbid obesity (HCC) 11/03/2017   Coronary artery disease involving native coronary artery of native heart with unstable angina pectoris (HCC) 11/03/2017   Insomnia 11/03/2017   Inadequate sleep hygiene 11/03/2017   Anxiety 07/21/2017   Dizziness 10/02/2016   Right patella fracture 08/29/2016   Acquired foot deformity, left 07/18/2016   Osteopenia 11/10/2015   HNP (herniated nucleus pulposus), lumbar 10/12/2014   Spinal stenosis at L4-L5 level 10/12/2014   Iron deficiency anemia 07/29/2014   DM (diabetes mellitus) with complications (HCC) 07/29/2014   Environmental and seasonal allergies 04/28/2014   Spinal stenosis, lumbar region, with neurogenic claudication 01/06/2013   Obesity, morbid, BMI 40.0-49.9 (HCC) 10/20/2012   Chest pain with moderate risk of acute coronary syndrome 10/02/2012   CAD S/P percutaneous coronary angioplasty    Hyperlipidemia with target LDL less than 70    Essential hypertension 04/16/2012    Past Medical History:  Diagnosis Date   Anemia    Arthritis  Bronchitis    hx of    CAD (coronary artery disease)    a. s/p DES to LAD in 2010 with patent stent by cath in 2014 b. low-risk NST in 11/2016   Cataracts, bilateral    Diabetes mellitus without complication (HCC)    Family history of adverse reaction to anesthesia    pts mother had difficulty awakening    GERD (gastroesophageal reflux disease)    Hyperlipidemia LDL goal < 70     Hypertension    Lumbar back pain    Numbness    left leg and foot    Plantar fascia rupture    Left Foot   Sleep apnea    uses CPAP     Past Surgical History:  Procedure Laterality Date   ABDOMINAL HYSTERECTOMY     BACK SURGERY     BREAST CYST ASPIRATION  1995   CAROTID STENT  2009   pt denies    CORONARY ANGIOPLASTY WITH STENT PLACEMENT  2010and 10-02-2012   Stent DES, Xience to prox. LAD   DOPPLER ECHOCARDIOGRAPHY  08/01/2009   EF=>55%,LV normal   LEFT HEART CATH AND CORONARY ANGIOGRAPHY N/A 12/10/2019   Procedure: LEFT HEART CATH AND CORONARY ANGIOGRAPHY;  Surgeon: Lyn Records, MD;  Location: [facility];  Service: Cardiovascular;  Laterality: N/A;   LEFT HEART CATH AND CORONARY ANGIOGRAPHY N/A 10/30/2020   Procedure: LEFT HEART CATH AND CORONARY ANGIOGRAPHY;  Surgeon: Runell Gess, MD;  Location: [facility];  Service: Cardiovascular;  Laterality: N/A;   LEFT HEART CATHETERIZATION WITH CORONARY ANGIOGRAM N/A 10/02/2012   Procedure: LEFT HEART CATHETERIZATION WITH CORONARY ANGIOGRAM;  Surgeon: Runell Gess, MD;  Location: [facility] CATH [facility];  Service: Cardiovascular;  Laterality: N/A;   lower arterial duplex  06/20/10   abi's normal,rgt 0.98,lft 1.06;bilateral PVRs normal   LUMBAR LAMINECTOMY/DECOMPRESSION MICRODISCECTOMY Left 01/06/2013   Procedure: MICRO LUMBAR DECOMPRESSION L4-5 AND L5-S1;  Surgeon: Javier Docker, MD;  Location: [facility];  Service: Orthopedics;  Laterality: Left;   LUMBAR LAMINECTOMY/DECOMPRESSION MICRODISCECTOMY Left 10/12/2014   Procedure: REVISION MICRO LUMBAR/DECOMPRESSION L4-5 LEFT ;  Surgeon: Jene Every, MD;  Location: [facility] OR[facility];  Service: Orthopedics;  Laterality: Left;   NM MYOCAR PERF WALL MOTION  09/22/2008   lexiscan-EF 83%; glogal LV systolic fx is norm. ,evidence of mild ischemia basal anterior,midanterior and apical lateral region(s).    ORIF PATELLA Right 08/29/2016   Procedure: OPEN REDUCTION INTERNAL (ORIF) FIXATION RIGHT PATELLA;   Surgeon: Samson Frederic, MD;  Location: [facility];  Service: Orthopedics;  Laterality: Right;  Adductor Block   TUBAL LIGATION     TYMPANOPLASTY Bilateral    UVULOPALATOPHARYNGOPLASTY     pt denies     Social History   Tobacco Use   Smoking status: Never   Smokeless tobacco: Never  Vaping Use   Vaping Use: Never used  Substance Use Topics   Alcohol use: Yes    Comment: occasional, 1 a month wine   Drug use: No    Family History  Problem Relation Age of Onset   Coronary artery disease Mother    Rheum arthritis Mother    Dementia Mother    Heart attack Father    Hypertension Father    Hyperlipidemia Father    Other Father        MVA   Hypertension Brother    Cancer Brother    Heart disease Brother    Cancer Paternal Grandmother  stomach   Diabetes Paternal Grandfather    Breast cancer Neg Hx     Allergies  Allergen Reactions   Crestor [Rosuvastatin] Other (See Comments)    myalgia   Niacin And Related Other (See Comments)    Whelps and skin flushed, mouth tingling    Medication list has been reviewed and updated.  Current Outpatient Medications on File Prior to Visit  Medication Sig Dispense Refill   amLODipine (NORVASC) 10 MG tablet Take 1 tablet by mouth daily. 90 tablet 1   aspirin EC 81 MG EC tablet Take 1 tablet (81 mg total) by mouth daily. 30 tablet 1   chlorpheniramine (CHLOR-TRIMETON) 4 MG tablet Take 1 tablet (4 mg total) by mouth daily. 14 tablet 3   cholecalciferol (VITAMIN D) 1000 UNITS tablet Take 1,000 Units by mouth daily.     clopidogrel (PLAVIX) 75 MG tablet TAKE 1 TABLET BY MOUTH DAILY. 90 tablet 3   cyclobenzaprine (FLEXERIL) 10 MG tablet Take 1 tablet (10 mg total) by mouth 2 (two) times daily as needed for muscle spasms. 20 tablet 0   denosumab (PROLIA) 60 MG/ML SOSY injection Inject 60 mg into the skin every 6 (six) months.     diazepam (VALIUM) 2 MG tablet Take 1 tablet (2 mg total) by mouth every 12 (twelve) hours as needed for  anxiety or muscle spasms. 15 tablet 0   ezetimibe (ZETIA) 10 MG tablet TAKE 1 TABLET BY MOUTH ONCE A DAY 90 tablet 3   fenofibrate (TRICOR) 145 MG tablet Take 1 tablet by mouth daily. 90 tablet 3   FLUoxetine (PROZAC) 20 MG capsule Take 1 capsule (20 mg total) by mouth daily. 90 capsule 0   fluticasone (FLONASE) 50 MCG/ACT nasal spray Place 1 spray into both nostrils daily as needed for allergies or rhinitis. 16 g 11   HYDROcodone-acetaminophen (NORCO/VICODIN) 5-325 MG tablet Take 1 tablet by mouth every 8 (eight) hours as needed. 10 tablet 0   icosapent Ethyl (VASCEPA) 1 g capsule TAKE 2 CAPSULES BY MOUTH TWICE DAILY (Patient taking differently: Take 2 g by mouth daily.) 360 capsule 3   isosorbide mononitrate (IMDUR) 30 MG 24 hr tablet Take 1 tablet (30 mg total) by mouth daily. 90 tablet 2   metFORMIN (GLUCOPHAGE-XR) 500 MG 24 hr tablet Take 1 tablet  by mouth 2  times daily. (Patient taking differently: Take 500 mg by mouth daily with breakfast.) 180 tablet 3   metoprolol succinate (TOPROL-XL) 25 MG 24 hr tablet TAKE 1/2 TABLET BY MOUTH DAILY. 45 tablet 1   Multiple Vitamin (MULTIVITAMIN WITH MINERALS) TABS tablet Take 1 tablet by mouth daily.     nitroGLYCERIN (NITROSTAT) 0.4 MG SL tablet Place 1 tablet under the tongue every 5 minutes as needed for chest pain. 25 tablet 2   RABEprazole (ACIPHEX) 20 MG tablet Take 1 tablet (20 mg total) by mouth daily 30 minutes prior to food 90 tablet 3   simvastatin (ZOCOR) 40 MG tablet Take 1 tablet (40 mg total) by mouth daily. 90 tablet 3   telmisartan (MICARDIS) 40 MG tablet TAKE 1 TABLET BY MOUTH EVERY MORNING * NEEDS OFFICE VISIT 90 tablet 2   [DISCONTINUED] rosuvastatin (CRESTOR) 20 MG tablet Take 1 tablet (20 mg total) by mouth daily. 30 tablet 3   No current facility-administered medications on file prior to visit.    Review of Systems:  As per HPI- otherwise negative.   Physical Examination: There were no vitals filed for this visit. There  were  no vitals filed for this visit. There is no height or weight on file to calculate BMI. Ideal Body Weight:    GEN: no acute distress. HEENT: Atraumatic, Normocephalic.  Ears and Nose: No external deformity. CV: RRR, No M/G/R. No JVD. No thrill. No extra heart sounds. PULM: CTA B, no wheezes, crackles, rhonchi. No retractions. No resp. distress. No accessory muscle use. ABD: S, NT, ND, +BS. No rebound. No HSM. EXTR: No c/c/e PSYCH: Normally interactive. Conversant.    Assessment and Plan: ***  Signed Lamar Blinks, MD

## 2021-12-27 ENCOUNTER — Ambulatory Visit (INDEPENDENT_AMBULATORY_CARE_PROVIDER_SITE_OTHER): Payer: PPO | Admitting: Family Medicine

## 2021-12-27 ENCOUNTER — Encounter: Payer: Self-pay | Admitting: Family Medicine

## 2021-12-27 VITALS — BP 124/60 | HR 60 | Temp 97.9°F | Resp 18 | Ht 61.5 in | Wt 191.8 lb

## 2021-12-27 DIAGNOSIS — I10 Essential (primary) hypertension: Secondary | ICD-10-CM | POA: Diagnosis not present

## 2021-12-27 DIAGNOSIS — D649 Anemia, unspecified: Secondary | ICD-10-CM

## 2021-12-27 DIAGNOSIS — R413 Other amnesia: Secondary | ICD-10-CM | POA: Diagnosis not present

## 2021-12-27 DIAGNOSIS — E785 Hyperlipidemia, unspecified: Secondary | ICD-10-CM

## 2021-12-27 DIAGNOSIS — Z23 Encounter for immunization: Secondary | ICD-10-CM | POA: Diagnosis not present

## 2021-12-27 DIAGNOSIS — Z9861 Coronary angioplasty status: Secondary | ICD-10-CM | POA: Diagnosis not present

## 2021-12-27 DIAGNOSIS — E118 Type 2 diabetes mellitus with unspecified complications: Secondary | ICD-10-CM | POA: Diagnosis not present

## 2021-12-27 DIAGNOSIS — M8580 Other specified disorders of bone density and structure, unspecified site: Secondary | ICD-10-CM | POA: Diagnosis not present

## 2021-12-27 DIAGNOSIS — I2510 Atherosclerotic heart disease of native coronary artery without angina pectoris: Secondary | ICD-10-CM

## 2021-12-27 LAB — CBC
HCT: 35.5 % — ABNORMAL LOW (ref 36.0–46.0)
Hemoglobin: 11.5 g/dL — ABNORMAL LOW (ref 12.0–15.0)
MCHC: 32.2 g/dL (ref 30.0–36.0)
MCV: 80.1 fl (ref 78.0–100.0)
Platelets: 368 10*3/uL (ref 150.0–400.0)
RBC: 4.44 Mil/uL (ref 3.87–5.11)
RDW: 14.2 % (ref 11.5–15.5)
WBC: 10.1 10*3/uL (ref 4.0–10.5)

## 2021-12-27 LAB — MICROALBUMIN / CREATININE URINE RATIO
Creatinine,U: 95.8 mg/dL
Microalb Creat Ratio: 0.9 mg/g (ref 0.0–30.0)
Microalb, Ur: 0.9 mg/dL (ref 0.0–1.9)

## 2021-12-27 LAB — HEMOGLOBIN A1C: Hgb A1c MFr Bld: 7.8 % — ABNORMAL HIGH (ref 4.6–6.5)

## 2021-12-27 LAB — VITAMIN B12: Vitamin B-12: 440 pg/mL (ref 211–911)

## 2021-12-27 LAB — TSH: TSH: 2.43 u[IU]/mL (ref 0.35–5.50)

## 2021-12-28 DIAGNOSIS — G4733 Obstructive sleep apnea (adult) (pediatric): Secondary | ICD-10-CM | POA: Diagnosis not present

## 2022-01-12 ENCOUNTER — Other Ambulatory Visit (HOSPITAL_COMMUNITY): Payer: Self-pay

## 2022-01-12 ENCOUNTER — Other Ambulatory Visit: Payer: Self-pay | Admitting: Family Medicine

## 2022-01-12 DIAGNOSIS — F411 Generalized anxiety disorder: Secondary | ICD-10-CM

## 2022-01-14 ENCOUNTER — Other Ambulatory Visit (HOSPITAL_COMMUNITY): Payer: Self-pay

## 2022-01-14 MED ORDER — FLUOXETINE HCL 20 MG PO CAPS
20.0000 mg | ORAL_CAPSULE | Freq: Every day | ORAL | 0 refills | Status: DC
  Filled 2022-01-14 – 2022-03-10 (×3): qty 90, 90d supply, fill #0

## 2022-01-15 ENCOUNTER — Other Ambulatory Visit: Payer: Self-pay | Admitting: Family Medicine

## 2022-01-15 ENCOUNTER — Other Ambulatory Visit (HOSPITAL_COMMUNITY): Payer: Self-pay

## 2022-01-16 ENCOUNTER — Other Ambulatory Visit (HOSPITAL_COMMUNITY): Payer: Self-pay

## 2022-01-17 ENCOUNTER — Other Ambulatory Visit (HOSPITAL_COMMUNITY): Payer: Self-pay

## 2022-01-18 ENCOUNTER — Other Ambulatory Visit (HOSPITAL_COMMUNITY): Payer: Self-pay

## 2022-01-21 ENCOUNTER — Telehealth: Payer: PPO

## 2022-01-23 ENCOUNTER — Telehealth: Payer: Self-pay | Admitting: Internal Medicine

## 2022-01-23 NOTE — Telephone Encounter (Signed)
Called patient back. They are traveling home from vacation today- she states that on Monday 10/30 they were about to eat lunch when she became extremely dizzy, she was unable to explain what was going on, but rescue squad was called - they completed an EKG (which they will bring with them to the appointment on Friday). Patient states they can not remember what her vitals were, but her HR was fast per rescue squad. Patient states she is not having any symptoms now, she just feels tired and weak. I did advise with her to check her HR/BP when she got home and was able. She states she will do this and let us know. I did advise if symptoms came back she should go to the emergency room. We did discuss symptoms to look out for. Patient currently denies chest discomfort, SOB, swelling. She did mention during the episode she had a heaviness in her chest, this is not currently happening.   Patient is scheduled to be seen on 11/03 with Dr.Hilty. patient verbalized understanding, will send to MD to make aware of plan discussed

## 2022-01-23 NOTE — Telephone Encounter (Signed)
Patient c/o Palpitations:  High priority if patient c/o lightheadedness, shortness of breath, or chest pain  How long have you had palpitations/irregular HR/ Afib? Are you having the symptoms now? Had Afib episode while on vacation on 01-21-22- no symptoms at this time  Are you currently experiencing lightheadedness, SOB or CP? She was very dizzy when she had this episode   Do you have a history of afib (atrial fibrillation) or irregular heart rhythm? no  Have you checked your BP or HR? (document readings if available):    Are you experiencing any other symptoms? A little chest pain in the center of her heart on Monday when she had the  episode at this time. Patient wanted an appointment- just arrived in town today- I made an appointment with Dr Debara Pickett on 01-25-22

## 2022-01-24 ENCOUNTER — Other Ambulatory Visit: Payer: Self-pay | Admitting: Family Medicine

## 2022-01-24 DIAGNOSIS — I2510 Atherosclerotic heart disease of native coronary artery without angina pectoris: Secondary | ICD-10-CM

## 2022-01-24 MED ORDER — NITROGLYCERIN 0.4 MG SL SUBL
0.4000 mg | SUBLINGUAL_TABLET | SUBLINGUAL | 2 refills | Status: DC | PRN
  Filled 2022-01-24: qty 25, 10d supply, fill #0
  Filled 2022-08-12: qty 25, 10d supply, fill #1

## 2022-01-25 ENCOUNTER — Other Ambulatory Visit (HOSPITAL_COMMUNITY): Payer: Self-pay

## 2022-01-25 ENCOUNTER — Encounter: Payer: Self-pay | Admitting: Internal Medicine

## 2022-01-25 ENCOUNTER — Ambulatory Visit: Payer: PPO | Attending: Internal Medicine | Admitting: Internal Medicine

## 2022-01-25 VITALS — HR 47 | Ht 61.5 in | Wt 196.2 lb

## 2022-01-25 DIAGNOSIS — T466X5D Adverse effect of antihyperlipidemic and antiarteriosclerotic drugs, subsequent encounter: Secondary | ICD-10-CM

## 2022-01-25 DIAGNOSIS — R42 Dizziness and giddiness: Secondary | ICD-10-CM

## 2022-01-25 DIAGNOSIS — R55 Syncope and collapse: Secondary | ICD-10-CM

## 2022-01-25 DIAGNOSIS — R001 Bradycardia, unspecified: Secondary | ICD-10-CM | POA: Diagnosis not present

## 2022-01-25 DIAGNOSIS — M791 Myalgia, unspecified site: Secondary | ICD-10-CM | POA: Diagnosis not present

## 2022-01-25 DIAGNOSIS — I4510 Unspecified right bundle-branch block: Secondary | ICD-10-CM

## 2022-01-25 DIAGNOSIS — E785 Hyperlipidemia, unspecified: Secondary | ICD-10-CM

## 2022-01-25 NOTE — Progress Notes (Signed)
Date:  01/25/2022   ID:  TAMORAH BARON, DOB 01-Sep-1950, MRN [id number]  PCP:  Darreld Mclean, MD  Primary Cardiologist:  Merrilyn Legler  CC:  Dizziness, tremor, preyncope   History of Present Illness: [name] is a 71 y.o. female with a history of CAD with DES stent to prox. LAD in 2010 with residual disease in LCX 10-20% and RCA 20% lesion.  She also has HTN, DM, dyslipidemia and obesity.  She presented on 10/01/12 to the ED with c/o chest pressure. Discomfort began at work and associated with flushing symptoms secondary to niacin. No DOE, SOB, nausea or diaphoresis. She then developed chest pressure and nausea and went to Urgent care. She thought the flushing was secondary to stopping ASA. She was given ASA, cooled down and then had chest presssure. Sent to [facility] ER and NTG SL did resolve the pressure. She was very concerned about the pressure because it was the same that she had with her [place] in 2010 that lead to her stent.  Coronary angiogram revealed widely patent proximal LAD stent with otherwise normal coronary arteries and normal LV function.  [name] recently underwent back surgery and is doing fairly well. She still is having some back pain but is adamant about not taking medication. She plans to go back to work on Monday next week. Unfortunately she is still grieving from her brother who passed away last week. He had stage IV pancreatic cancer and lived less than one year.  [name] returns today for follow-up. Overall she denies any chest pain or worsening shortness of breath. Recently she's been having some palpitations, however she attributes that to stress. She recently is been placed on CPAP and seems to be tolerating that well. She's had some problems with memory loss but neurologic workup is been unremarkable. She recently had worsening back problems and has been to see Dr. Tonita Cong. He is considering surgery on her back but would need her to come off of her antiplatelets  medications.  At the pleasure seeing [name] back today in follow-up. She underwent successful back surgery and is doing better. She's managed to lose significant amount of weight. She's remained on Toprol which was started preoperatively. Her surgery was without complication. Recently she's had some dizziness with change in position. This could be related to dietary changes however I did note that her diastolic pressure is low today at 56. She denies any chest pain or worsening shortness of breath.  02/12/2016  [name] returns today for follow-up. She is recently been having some more palpitations. She reports being under significant stress as her daughter and granddaughter are now living with them. She gets some fullness in her chest when she has palpitations. These episodes occur 2-3 times a week over the past month or so. She denies any exertional chest pain or worsening shortness of breath. Weight is [name] down 3 pounds since her last office visit. Blood pressure is well-controlled. She recently started Celebrex and I spoke with her primary care provider about this. She needs to be careful about taking it regularly in the setting of aspirin and Plavix use.  03/07/2016  I saw [name] back today for follow-up of her monitor. She reports she had 2 episodes of palpitations and some elevated blood pressure on the day of Thanksgiving. She was dealing with her sister who had recently had a brain bleed and had to take her to the emergency room. She apparently had some  seizures. She was also trying to cook Thanksgiving meals and felt overwhelmed. She did take some Valium with some benefit however one point said her blood pressure was up to 210/100. Since then she's felt much better. We did not to capture any episodes of arrhythmias, extrasystoles or anything concerning on her monitor. I suspect her symptoms are related to stress and anxiety. Blood pressure is fairly well-controlled  today.  10/02/2016  [name] returns today for follow-up. Unfortunate she recently broke her leg. She says she has a problem with some foot drag and she misstepped. She had have surgery and is still wearing a brace in the right leg. She is improving and has better range of motion. Blood pressure is good today 138/62. She reports recently she's been having some dizziness. Sometimes positional with sitting up at other times not positional. She is afraid of falling. She's been very anxious. In fact she's been taking Valium although she says only a few times a week. She does not think that is related to either the Valium or her pain medicine. She is concerned it could be her blood pressure although has not noted any low blood pressure readings.  07/21/2017  [name] was seen today in follow-up.  She is without any cardiac complaints.  In September she was hospitalized with chest pain symptoms.  She ruled out for MI and underwent stress testing which was low risk.  Subsequently she was noted to have significant anxiety, some degree of agoraphobia, and fear of walking by herself.  She was started on fluoxetine and has been referred to a psychologist.  She is also undergone some cognitive behavioral therapy which has been helpful.  02/04/2018  [name] was seen today in follow-up of recent hospitalization for chest pain.  She was seen by my partner Dr. Algernon Huxley who felt that her pain was pleuritic, positional and reproducible on exam and not likely cardiac.  She ruled out for MI.  According to her husband recently she has been caring more things and doing more exertion and he feels that she may have strained her back.  She does describe pain and tenderness which was reproducible along the right mid thoracic area that seems to wrap around the anterior chest.  The symptoms are still persistent but somewhat better.  05/17/2020  [name] is seen today in follow-up.  She has been followed by Coletta Memos, NP more recently.   She has had some intolerance to her medications, namely her Vascepa which she currently takes 1 g twice daily.  She was switched to rosuvastatin but she had significant myalgias with this.  She went back to simvastatin 40 mg.  She has not had her lipids reassessed.  Her target LDL is less than 70 given coronary disease although fortunately recent cardiac catheterization showed no significant obstructive disease.  She denies any chest pain or worsening shortness of breath and has lost a couple pounds recently according to her home scale  12/06/2020  Davanna returns today for follow-up.  Overall she reports she is feeling much better.  She saw Laurann Montana over the summer and was complaining of some chest pain.  She underwent a Myoview stress test which was negative for ischemia.  Subsequently she presented the ER with ongoing left sternal chest pain.  She underwent cardiac catheterization which demonstrated a patent LAD stent and no other significant coronary disease.  LV function was normal by echo.  Ultimately she had a follow-up with Dr. Melvyn Novas.  He thought that her symptoms  might be related to reflux in fact had recommended she stop Fosamax.  She was changed to Aciphex.  Since then her symptoms have resolved.  I suspect he was right in that diagnosis.  She does have follow-up with him.  I had added fenofibrate for her triglycerides which were well over 500.  She is due for repeat lipids and is fasting today.  06/05/2021  Christinna returns today for follow-up.  Overall she seems to be doing well.  Her lipids appear much better.  Total cholesterol 143, triglycerides 124, HDL 37 LDL 84.  She seems to be tolerating the medicines without any issues.  She denies any recurrent chest pain.  Her A1c crept up a little at 7.5% in January but she says she is lost weight and now is below 200 pounds.  She is trying to get down to about 160 pounds.  01/25/2022  [name] is seen today for dizziness and presyncope.  She was  apparently at the beach with her husband and friends and had episodes of presyncope and dizziness.  She was seen by EMS and noted to be tachycardic although EKG strips they brought in today heart rate was in the 60s and appeared to be a sinus rhythm.  She is actually had some issues with bradycardia.  She is concerned because her mother also had bradycardia and required a pacemaker.  She is on low-dose metoprolol.  Heart rate today in the 40s.  She says she feels like she has episodes where she is unsteady and feels like she may pass out.  She is also had significant tremor.  She says this also does run in the family.  She is seen by Dr. Leta Baptist who is her neurologist.  Orthostatic vitals were performed today and were negative.   Wt Readings from Last 3 Encounters:  01/25/22 196 lb 3.2 oz (89 kg)  12/27/21 191 lb 12.8 oz (87 kg)  09/12/21 197 lb 3.2 oz (89.4 kg)     Past Medical History:  Diagnosis Date   Anemia    Arthritis    Bronchitis    hx of    CAD (coronary artery disease)    a. s/p DES to LAD in 2010 with patent stent by cath in 2014 b. low-risk NST in 11/2016   Cataracts, bilateral    Diabetes mellitus without complication (HCC)    Family history of adverse reaction to anesthesia    pts mother had difficulty awakening    GERD (gastroesophageal reflux disease)    Hyperlipidemia LDL goal < 70    Hypertension    Lumbar back pain    Numbness    left leg and foot    Plantar fascia rupture    Left Foot   Sleep apnea    uses CPAP     Current Outpatient Medications  Medication Sig Dispense Refill   amLODipine (NORVASC) 10 MG tablet Take 1 tablet by mouth daily. 90 tablet 1   aspirin EC 81 MG EC tablet Take 1 tablet (81 mg total) by mouth daily. 30 tablet 1   cholecalciferol (VITAMIN D) 1000 UNITS tablet Take 1,000 Units by mouth daily.     clopidogrel (PLAVIX) 75 MG tablet TAKE 1 TABLET BY MOUTH DAILY. 90 tablet 3   denosumab (PROLIA) 60 MG/ML SOSY injection Inject 60 mg  into the skin every 6 (six) months.     ezetimibe (ZETIA) 10 MG tablet TAKE 1 TABLET BY MOUTH ONCE A DAY 90 tablet 3   famotidine (  PEPCID) 20 MG tablet Take 20 mg by mouth daily.     fenofibrate (TRICOR) 145 MG tablet Take 1 tablet by mouth daily. 90 tablet 3   FLUoxetine (PROZAC) 20 MG capsule Take 1 capsule (20 mg total) by mouth daily. 90 capsule 0   fluticasone (FLONASE) 50 MCG/ACT nasal spray Place 1 spray into both nostrils daily as needed for allergies or rhinitis. 16 g 11   icosapent Ethyl (VASCEPA) 1 g capsule TAKE 2 CAPSULES BY MOUTH TWICE DAILY (Patient taking differently: Take 2 g by mouth daily.) 360 capsule 3   isosorbide mononitrate (IMDUR) 30 MG 24 hr tablet Take 1 tablet (30 mg total) by mouth daily. 90 tablet 2   metFORMIN (GLUCOPHAGE-XR) 500 MG 24 hr tablet Take 1 tablet by mouth daily with breakfast.     metoprolol succinate (TOPROL-XL) 25 MG 24 hr tablet TAKE 1/2 TABLET BY MOUTH DAILY. 45 tablet 1   Multiple Vitamin (MULTIVITAMIN WITH MINERALS) TABS tablet Take 1 tablet by mouth daily.     RABEprazole (ACIPHEX) 20 MG tablet Take 1 tablet (20 mg total) by mouth daily 30 minutes prior to food 90 tablet 3   simvastatin (ZOCOR) 40 MG tablet Take 1 tablet (40 mg total) by mouth daily. 90 tablet 3   telmisartan (MICARDIS) 40 MG tablet TAKE 1 TABLET BY MOUTH EVERY MORNING * NEEDS OFFICE VISIT 90 tablet 2   chlorpheniramine (CHLOR-TRIMETON) 4 MG tablet Take 1 tablet (4 mg total) by mouth daily. (Patient not taking: Reported on 01/25/2022) 14 tablet 3   cyclobenzaprine (FLEXERIL) 10 MG tablet Take 1 tablet (10 mg total) by mouth 2 (two) times daily as needed for muscle spasms. (Patient not taking: Reported on 01/25/2022) 20 tablet 0   diazepam (VALIUM) 2 MG tablet Take 1 tablet (2 mg total) by mouth every 12 (twelve) hours as needed for anxiety or muscle spasms. (Patient not taking: Reported on 01/25/2022) 15 tablet 0   HYDROcodone-acetaminophen (NORCO/VICODIN) 5-325 MG tablet Take 1 tablet  by mouth every 8 (eight) hours as needed. (Patient not taking: Reported on 01/25/2022) 10 tablet 0   metFORMIN (GLUCOPHAGE-XR) 500 MG 24 hr tablet Take 1 tablet  by mouth 2  times daily. (Patient not taking: Reported on 01/25/2022) 180 tablet 3   nitroGLYCERIN (NITROSTAT) 0.4 MG SL tablet Place 1 tablet under the tongue every 5 minutes as needed for chest pain. (Patient not taking: Reported on 01/25/2022) 25 tablet 2   No current facility-administered medications for this visit.    Allergies:    Allergies  Allergen Reactions   Crestor [Rosuvastatin] Other (See Comments)    myalgia   Niacin And Related Other (See Comments)    [place] and skin flushed, mouth tingling    Social History:  The patient  reports that she has never smoked. She has never used smokeless tobacco. She reports current alcohol use. She reports that she does not use drugs.   Family history:   Family History  Problem Relation Age of Onset   Coronary artery disease Mother    Rheum arthritis Mother    Dementia Mother    Heart attack Father    Hypertension Father    Hyperlipidemia Father    Other Father        MVA   Hypertension Brother    Cancer Brother    Heart disease Brother    Cancer Paternal Grandmother        stomach   Diabetes Paternal Grandfather    Breast cancer  Neg Hx     ROS: Pertinent items noted in HPI and remainder of comprehensive ROS otherwise negative.   PHYSICAL EXAM: VS:  Pulse (!) 47   Ht 5' 1.5" (1.562 m)   Wt 196 lb 3.2 oz (89 kg)   LMP  (LMP Unknown)   SpO2 98%   BMI 36.47 kg/m   General appearance: alert and no distress Lungs: clear to auscultation bilaterally Heart: regular bradycardia Extremities: extremities normal, atraumatic, no cyanosis or edema Neurologic: Grossly normal  EKG: Sinus bradycardia at 47, RBBB-personally reviewed  ASSESSMENT: Dizziness, pre-syncope, tremor Symptomatic bradycardia Musculoskeletal chest pain -cath with no significant coronary disease  and a patent stent in the LAD (10/2020) Normal Lexiscan Myoview and normal echo in 10/2020 Chronic RBBB Coronary artery disease status post DES the proximal LAD in 2010, low risk Myoview (11/2016)-LVEF 78% -no significant obstructive disease by cath in September 2021 Hypertension Dyslipidemia Obesity Type 2 diabetes Palpitations OSA on CPAP Anxiety  PLAN:  1.  Mrs. Pontillo has had significant concerning symptoms recently where it sounds like she may have symptomatic bradycardia.  Heart rates in the 40s and she is having episodes that might be Stokes-Adams attacks.  I advised her to stop her metoprolol.  We will go ahead and place a 30-day monitor real-time to see if we can pick up on any events that may indicate worsening conduction delay.  She does have a chronic right bundle branch block so there is some evidence of intracardiac conduction delay.  She also has had some dizziness, tremor, particularly axially which might be familial.  She has follow-up with neurology scheduled next month.  We will plan to see her back in about 1 to 2 months to follow-up on her monitor.  We will repeat labs today including a lipid profile and metabolic profile as routine follow-up.  Pixie Casino, MD, [facility], [place] Director of the Advanced Lipid Disorders &  Cardiovascular Risk Reduction Clinic Diplomate of the American Board of Clinical Lipidology Attending Cardiologist  Direct Dial: [phone]  Fax: [phone]  Website:  [email or web address]

## 2022-01-25 NOTE — Patient Instructions (Signed)
Medication Instructions:  STOP metoprolol   *If you need a refill on your cardiac medications before your next appointment, please call your pharmacy*   Testing/Procedures: Your physician has recommended that you wear an event monitor - 30 days. Event monitors are medical devices that record the heart's electrical activity. Doctors most often [place] these monitors to diagnose arrhythmias. Arrhythmias are problems with the speed or rhythm of the heartbeat. The monitor is a small, portable device. You can wear one while you do your normal daily activities. This is usually used to diagnose what is causing palpitations/syncope (passing out).    Follow-Up: At [facility], you and your health needs are our priority.  As part of our continuing mission to provide you with exceptional heart care, we have created designated Provider Care Teams.  These Care Teams include your primary Cardiologist (physician) and Advanced Practice Providers (APPs -  Physician Assistants and Nurse Practitioners) who all work together to provide you with the care you need, when you need it.  We recommend signing up for the patient portal called "MyChart".  Sign up information is provided on this After Visit Summary.  MyChart is used to connect with patients for Virtual Visits (Telemedicine).  Patients are able to view lab/test results, encounter notes, upcoming appointments, etc.  Non-urgent messages can be sent to your provider as well.   To learn more about what you can do with MyChart, go to [email or web address].    Your next appointment:    2-3 months with Dr. Debara Pickett or PA/NP

## 2022-01-25 NOTE — Telephone Encounter (Signed)
Patient seen in office 01/25/22 by Debara Pickett MD

## 2022-01-26 ENCOUNTER — Other Ambulatory Visit (HOSPITAL_COMMUNITY): Payer: Self-pay

## 2022-01-26 LAB — COMPREHENSIVE METABOLIC PANEL
ALT: 14 IU/L (ref 0–32)
AST: 17 IU/L (ref 0–40)
Albumin/Globulin Ratio: 1.8 (ref 1.2–2.2)
Albumin: 4.4 g/dL (ref 3.8–4.8)
Alkaline Phosphatase: 50 IU/L (ref 44–121)
BUN/Creatinine Ratio: 15 (ref 12–28)
BUN: 16 mg/dL (ref 8–27)
Bilirubin Total: 0.4 mg/dL (ref 0.0–1.2)
CO2: 23 mmol/L (ref 20–29)
Calcium: 10 mg/dL (ref 8.7–10.3)
Chloride: 105 mmol/L (ref 96–106)
Creatinine, Ser: 1.06 mg/dL — ABNORMAL HIGH (ref 0.57–1.00)
Globulin, Total: 2.5 g/dL (ref 1.5–4.5)
Glucose: 126 mg/dL — ABNORMAL HIGH (ref 70–99)
Potassium: 4.5 mmol/L (ref 3.5–5.2)
Sodium: 143 mmol/L (ref 134–144)
Total Protein: 6.9 g/dL (ref 6.0–8.5)
eGFR: 56 mL/min/{1.73_m2} — ABNORMAL LOW (ref >59)

## 2022-01-26 LAB — LIPID PANEL
Chol/HDL Ratio: 4.5 ratio — ABNORMAL HIGH (ref 0.0–4.4)
Cholesterol, Total: 180 mg/dL (ref 100–199)
HDL: 40 mg/dL (ref >39)
LDL Chol Calc (NIH): 112 mg/dL — ABNORMAL HIGH (ref 0–99)
Triglycerides: 158 mg/dL — ABNORMAL HIGH (ref 0–149)
VLDL Cholesterol Cal: 28 mg/dL (ref 5–40)

## 2022-02-01 ENCOUNTER — Ambulatory Visit: Payer: PPO | Attending: Internal Medicine

## 2022-02-01 DIAGNOSIS — R55 Syncope and collapse: Secondary | ICD-10-CM

## 2022-02-01 DIAGNOSIS — R001 Bradycardia, unspecified: Secondary | ICD-10-CM

## 2022-02-01 DIAGNOSIS — R42 Dizziness and giddiness: Secondary | ICD-10-CM | POA: Diagnosis not present

## 2022-02-03 NOTE — Progress Notes (Unsigned)
[facility] [address] [place], [place] [address] [id number] [phone]  Date:  02/06/2022   Name:  [name]   DOB:  [birth year]   MRN:  [id number]  PCP:  Darreld Mclean, MD    Chief Complaint: Weakness (Seen by Dr Debara Pickett, stopped Metoprolol. Still has trembles. Gait unsteady. Cucusion 4 years ago)   History of Present Illness:  [name] is a 71 y.o. very pleasant female patient who presents with the following:  Patient seen today with concern of being dizzy and lightheaded Most recent visit with myself was in October History of hypertension, CAD, diabetes, sleep apnea, obesity, osteopenia with elevated fracture risk on fosamax Status post PCI 2010 and 2014 Last summer she was admitted with chest pain and had a cath, which was negative She had been on Fosamax-stopped by pulmonology, Dr. Melvyn Novas in case it was worsening her reflux and shortness of breath She now uses Prolia for her osteopenia  Over the summer she was seen in the ER with symptoms concerning for stroke, MRI head was negative.  She has followed up with neurology who also did not find evidence of stroke She has had a few unusual episodes where she will seem to be confused, have difficulty moving and have shaking/tremors.  So far we have not been able to determine a cause for the symptoms  She was seen by cardiology earlier this month-Dr. Hilty.  He noted possible symptomatic bradycardia with heart rates into the 40s, he had her stop metoprolol and start a 30-day monitor  She is seeing neurology in about 1 month Seen today with her husband Mortimer Fries.  Their main concern is her shakiness and full body tremors which have been present for a year but maybe worse the last several weeks They note her father had a tremor in his older years but not this severe Mortimer Fries also notes she seems to be more forgetful  She has stopped driving for now because she is concerned about  safety  Lab Results  Component Value Date   HGBA1C 7.8 (H) 12/27/2021   They also note that on 10/30 she started having CP while at a restaurant, she felt really shaky. EMS came to the restaurant and did an EKG- they report it showed atrial fibrillation.  However, per Dr. Lysbeth Penner note this was just sinus rhythm She was seen by [facility] on 11/3- he noted significant bradycardia, had her stop her toprol and started an event monitor as below They know her heart rate has improved since she stopped the beta-blocker, has come up to about 60 whereas previously was in the 40s  ASSESSMENT: Dizziness, pre-syncope, tremor Symptomatic bradycardia Musculoskeletal chest pain -cath with no significant coronary disease and a patent stent in the LAD (10/2020) Normal Lexiscan Myoview and normal echo in 10/2020 Chronic RBBB Coronary artery disease status post DES the proximal LAD in 2010, low risk Myoview (11/2016)-LVEF 78% -no significant obstructive disease by cath in September 2021 Hypertension Dyslipidemia Obesity Type 2 diabetes Palpitations OSA on CPAP Anxiety PLAN: 1.  Mrs. Duchesne has had significant concerning symptoms recently where it sounds like she may have symptomatic bradycardia.  Heart rates in the 40s and she is having episodes that might be Stokes-Adams attacks.  I advised her to stop her metoprolol.  We will go ahead and place a 30-day monitor real-time to see if we can pick up on any events that may indicate worsening  conduction delay.  She does have a chronic right bundle branch block so there is some evidence of intracardiac conduction delay.  She also has had some dizziness, tremor, particularly axially which might be familial.  She has follow-up with neurology scheduled next month.  We will plan to see her back in about 1 to 2 months to follow-up on her monitor.  We will repeat labs today including a lipid profile and metabolic profile as routine follow-up. Patient Active Problem List    Diagnosis Date Noted   Primary osteoarthritis of right knee 08/30/2021   Cervical radiculopathy 08/16/2021   Shortness of breath 01/30/2021   Heartburn 01/30/2021   Chronic rhinitis 01/30/2021   Upper airway cough syndrome 11/09/2020   DOE (dyspnea on exertion) 11/09/2020   AKI (acute kidney injury) ([place]) 10/30/2020   Obstructive sleep apnea treated with continuous positive airway pressure (CPAP) 05/07/2018   Chest pain 01/31/2018   Morbid obesity ([place]) 11/03/2017   Coronary artery disease involving native coronary artery of native heart with unstable angina pectoris ([place]) 11/03/2017   Insomnia 11/03/2017   Inadequate sleep hygiene 11/03/2017   Anxiety 07/21/2017   Dizziness 10/02/2016   Right patella fracture 08/29/2016   Acquired foot deformity, left 07/18/2016   Osteopenia 11/10/2015   HNP (herniated nucleus pulposus), lumbar 10/12/2014   Spinal stenosis at L4-L5 level 10/12/2014   Iron deficiency anemia 07/29/2014   DM (diabetes mellitus) with complications ([place]) [id number]   Environmental and seasonal allergies 04/28/2014   Spinal stenosis, lumbar region, with neurogenic claudication 01/06/2013   Obesity, morbid, BMI 40.0-49.9 ([place]) 10/20/2012   Chest pain with moderate risk of acute coronary syndrome 10/02/2012   CAD S/P percutaneous coronary angioplasty    Hyperlipidemia with target LDL less than 70    Essential hypertension 04/16/2012    Past Medical History:  Diagnosis Date   Anemia    Arthritis    Bronchitis    hx of    CAD (coronary artery disease)    a. s/p DES to LAD in 2010 with patent stent by cath in 2014 b. low-risk NST in 11/2016   Cataracts, bilateral    Diabetes mellitus without complication ([place])    Family history of adverse reaction to anesthesia    pts mother had difficulty awakening    GERD (gastroesophageal reflux disease)    Hyperlipidemia LDL goal < 70    Hypertension    Lumbar back pain    Numbness    left leg and foot    Plantar fascia  rupture    Left Foot   Sleep apnea    uses CPAP     Past Surgical History:  Procedure Laterality Date   ABDOMINAL HYSTERECTOMY     BACK SURGERY     BREAST CYST ASPIRATION  1995   CAROTID STENT  2009   pt denies    CORONARY ANGIOPLASTY WITH STENT PLACEMENT  2010and 10-02-2012   Stent DES, Xience to prox. LAD   DOPPLER ECHOCARDIOGRAPHY  08/01/2009   EF=>55%,LV normal   LEFT HEART CATH AND CORONARY ANGIOGRAPHY N/A 12/10/2019   Procedure: LEFT HEART CATH AND CORONARY ANGIOGRAPHY;  Surgeon: Belva Crome, MD;  Location: [place] [facility];  Service: Cardiovascular;  Laterality: N/A;   LEFT HEART CATH AND CORONARY ANGIOGRAPHY N/A 10/30/2020   Procedure: LEFT HEART CATH AND CORONARY ANGIOGRAPHY;  Surgeon: Lorretta Harp, MD;  Location: [place] [facility];  Service: Cardiovascular;  Laterality: N/A;   LEFT HEART CATHETERIZATION WITH CORONARY ANGIOGRAM N/A  10/02/2012   Procedure: LEFT HEART CATHETERIZATION WITH CORONARY ANGIOGRAM;  Surgeon: Lorretta Harp, MD;  Location: [facility];  Service: Cardiovascular;  Laterality: N/A;   lower arterial duplex  06/20/10   abi's normal,rgt 0.98,lft 1.06;bilateral PVRs normal   LUMBAR LAMINECTOMY/DECOMPRESSION MICRODISCECTOMY Left 01/06/2013   Procedure: MICRO LUMBAR DECOMPRESSION L4-5 AND L5-S1;  Surgeon: Johnn Hai, MD;  Location: [facility];  Service: Orthopedics;  Laterality: Left;   LUMBAR LAMINECTOMY/DECOMPRESSION MICRODISCECTOMY Left 10/12/2014   Procedure: REVISION MICRO LUMBAR/DECOMPRESSION L4-5 LEFT ;  Surgeon: Susa Day, MD;  Location: [facility];  Service: Orthopedics;  Laterality: Left;   NM MYOCAR PERF WALL MOTION  09/22/2008   lexiscan-EF 83%; glogal LV systolic fx is norm. ,evidence of mild ischemia basal anterior,midanterior and apical lateral region(s).    ORIF PATELLA Right 08/29/2016   Procedure: OPEN REDUCTION INTERNAL (ORIF) FIXATION RIGHT PATELLA;  Surgeon: Rod Can, MD;  Location: [facility];  Service: Orthopedics;  Laterality:  Right;  Adductor Block   TUBAL LIGATION     TYMPANOPLASTY Bilateral    UVULOPALATOPHARYNGOPLASTY     pt denies     Social History   Tobacco Use   Smoking status: Never   Smokeless tobacco: Never  Vaping Use   Vaping Use: Never used  Substance Use Topics   Alcohol use: Yes    Comment: occasional, 1 a month wine   Drug use: No    Family History  Problem Relation Age of Onset   Coronary artery disease Mother    Rheum arthritis Mother    Dementia Mother    Heart attack Father    Hypertension Father    Hyperlipidemia Father    Other Father        MVA   Hypertension Brother    Cancer Brother    Heart disease Brother    Cancer Paternal Grandmother        stomach   Diabetes Paternal Grandfather    Breast cancer Neg Hx     Allergies  Allergen Reactions   Crestor [Rosuvastatin] Other (See Comments)    myalgia   Niacin And Related Other (See Comments)    [place] and skin flushed, mouth tingling    Medication list has been reviewed and updated.  Current Outpatient Medications on File Prior to Visit  Medication Sig Dispense Refill   amLODipine (NORVASC) 10 MG tablet Take 1 tablet by mouth daily. 90 tablet 1   aspirin EC 81 MG EC tablet Take 1 tablet (81 mg total) by mouth daily. 30 tablet 1   chlorpheniramine (CHLOR-TRIMETON) 4 MG tablet Take 1 tablet (4 mg total) by mouth daily. 14 tablet 3   cholecalciferol (VITAMIN D) 1000 UNITS tablet Take 1,000 Units by mouth daily.     clopidogrel (PLAVIX) 75 MG tablet TAKE 1 TABLET BY MOUTH DAILY. 90 tablet 3   cyclobenzaprine (FLEXERIL) 10 MG tablet Take 1 tablet (10 mg total) by mouth 2 (two) times daily as needed for muscle spasms. 20 tablet 0   denosumab (PROLIA) 60 MG/ML SOSY injection Inject 60 mg into the skin every 6 (six) months.     diazepam (VALIUM) 2 MG tablet Take 1 tablet (2 mg total) by mouth every 12 (twelve) hours as needed for anxiety or muscle spasms. 15 tablet 0   ezetimibe (ZETIA) 10 MG tablet TAKE 1 TABLET BY  MOUTH ONCE A DAY 90 tablet 3   famotidine (PEPCID) 20 MG tablet Take 20 mg by mouth daily.     fenofibrate (  TRICOR) 145 MG tablet Take 1 tablet by mouth daily. 90 tablet 3   FLUoxetine (PROZAC) 20 MG capsule Take 1 capsule (20 mg total) by mouth daily. 90 capsule 0   fluticasone (FLONASE) 50 MCG/ACT nasal spray Place 1 spray into both nostrils daily as needed for allergies or rhinitis. 16 g 11   HYDROcodone-acetaminophen (NORCO/VICODIN) 5-325 MG tablet Take 1 tablet by mouth every 8 (eight) hours as needed. 10 tablet 0   icosapent Ethyl (VASCEPA) 1 g capsule TAKE 2 CAPSULES BY MOUTH TWICE DAILY (Patient taking differently: Take 2 g by mouth daily.) 360 capsule 3   isosorbide mononitrate (IMDUR) 30 MG 24 hr tablet Take 1 tablet (30 mg total) by mouth daily. 90 tablet 2   metFORMIN (GLUCOPHAGE-XR) 500 MG 24 hr tablet Take 1 tablet  by mouth 2  times daily. 180 tablet 3   metFORMIN (GLUCOPHAGE-XR) 500 MG 24 hr tablet Take 1 tablet by mouth daily with breakfast.     Multiple Vitamin (MULTIVITAMIN WITH MINERALS) TABS tablet Take 1 tablet by mouth daily.     nitroGLYCERIN (NITROSTAT) 0.4 MG SL tablet Place 1 tablet under the tongue every 5 minutes as needed for chest pain. 25 tablet 2   RABEprazole (ACIPHEX) 20 MG tablet Take 1 tablet (20 mg total) by mouth daily 30 minutes prior to food 90 tablet 3   simvastatin (ZOCOR) 40 MG tablet Take 1 tablet (40 mg total) by mouth daily. 90 tablet 3   telmisartan (MICARDIS) 40 MG tablet TAKE 1 TABLET BY MOUTH EVERY MORNING * NEEDS OFFICE VISIT 90 tablet 2   [DISCONTINUED] rosuvastatin (CRESTOR) 20 MG tablet Take 1 tablet (20 mg total) by mouth daily. 30 tablet 3   No current facility-administered medications on file prior to visit.    Review of Systems:  As per HPI- otherwise negative. Wt Readings from Last 3 Encounters:  02/06/22 192 lb 12.8 oz (87.5 kg)  01/25/22 196 lb 3.2 oz (89 kg)  12/27/21 191 lb 12.8 oz (87 kg)    Physical Examination: Vitals:    02/06/22 1512  BP: 132/60  Pulse: (!) 59  Resp: 18  Temp: 97.8 F (36.6 C)  SpO2: 97%   Vitals:   02/06/22 1512  Weight: 192 lb 12.8 oz (87.5 kg)  Height: 5' 1.5" (1.562 m)   Body mass index is 35.84 kg/m. Ideal Body Weight: Weight in (lb) to have BMI = 25: 134.2  GEN: no acute distress.  Obese, sometimes appears quite anxious and can be tearful HEENT: Atraumatic, Normocephalic.  Bilateral TM wnl, oropharynx normal.  PEERL,EOMI.   Ears and Nose: No external deformity. CV: RRR, No M/G/R. No JVD. No thrill. No extra heart sounds. PULM: CTA B, no wheezes, crackles, rhonchi. No retractions. No resp. distress. No accessory muscle use. ABD: S, NT, ND, EXTR: No c/c/e PSYCH: Normally interactive. Conversant.  Is able to sit quietly with no tremor.  However, when she stands she has quite a bit of tremor in her arms and legs.  Her walking gait is unsteady-she is able to maintain Romberg stance for a few seconds but then has significant wobbliness although she does not step out Normal strength of all 4 limbs  Assessment and Plan: Essential hypertension  Hyperlipidemia with target LDL less than 70  CAD S/P percutaneous coronary angioplasty  DM (diabetes mellitus) with complications ([place])  Tremor - Plan: clonazePAM (KLONOPIN) 0.5 MG tablet, DISCONTINUED: clonazePAM (KLONOPIN) 0.5 MG tablet  Patient seen today for follow-up.  Her blood  pressure is under good control Her bradycardia is improved with removal of beta-blocker-wearing a 30-day event monitor Her biggest concern of these episodes of severe tremor, feeling unsteady on her feet and like she might pass out.  She is not currently driving because she is afraid to. She has had a few more severe episodes which resulted in EMS being called and ER evaluation for stroke, MRI however was normal  Certainly she may have an undiscovered neurologic issue, bradycardia could also be contributing.  I do feel there is an element of anxiety  here and the patient, her husband agree.  They would be willing to try low-dose of clonazepam and see if that may help ease her symptoms We will have her start on clonazepam 0.25 to 0.5 mg twice daily I will touch base with her neurologist to see if he wants me to do anything else prior to her upcoming appointment  Agree that she should not drive right now if she does not feel safe doing so.  They will let me know if any change or worsening of her symptoms   Signed Abbe Amsterdam, MD

## 2022-02-03 NOTE — Patient Instructions (Incomplete)
Good to see you again today Recommend the latest covid booster and dose of RSV if not done already  Try the clonazepam 0.25 mg - 0.5 mg twice a day; let me know if this helps at all Your pulse is much better

## 2022-02-06 ENCOUNTER — Ambulatory Visit (INDEPENDENT_AMBULATORY_CARE_PROVIDER_SITE_OTHER): Payer: PPO | Admitting: Family Medicine

## 2022-02-06 ENCOUNTER — Other Ambulatory Visit (HOSPITAL_BASED_OUTPATIENT_CLINIC_OR_DEPARTMENT_OTHER): Payer: Self-pay

## 2022-02-06 VITALS — BP 132/60 | HR 59 | Temp 97.8°F | Resp 18 | Ht 61.5 in | Wt 192.8 lb

## 2022-02-06 DIAGNOSIS — I10 Essential (primary) hypertension: Secondary | ICD-10-CM

## 2022-02-06 DIAGNOSIS — R251 Tremor, unspecified: Secondary | ICD-10-CM

## 2022-02-06 DIAGNOSIS — E118 Type 2 diabetes mellitus with unspecified complications: Secondary | ICD-10-CM | POA: Diagnosis not present

## 2022-02-06 DIAGNOSIS — E785 Hyperlipidemia, unspecified: Secondary | ICD-10-CM

## 2022-02-06 DIAGNOSIS — Z9861 Coronary angioplasty status: Secondary | ICD-10-CM

## 2022-02-06 DIAGNOSIS — I2510 Atherosclerotic heart disease of native coronary artery without angina pectoris: Secondary | ICD-10-CM

## 2022-02-06 DIAGNOSIS — R001 Bradycardia, unspecified: Secondary | ICD-10-CM | POA: Diagnosis not present

## 2022-02-06 MED ORDER — CLONAZEPAM 0.5 MG PO TABS
0.2500 mg | ORAL_TABLET | Freq: Two times a day (BID) | ORAL | 1 refills | Status: DC | PRN
  Filled 2022-02-06: qty 30, 15d supply, fill #0

## 2022-02-06 MED ORDER — CLONAZEPAM 0.5 MG PO TABS
0.2500 mg | ORAL_TABLET | Freq: Two times a day (BID) | ORAL | 1 refills | Status: DC | PRN
  Filled 2022-02-06: qty 30, 15d supply, fill #0
  Filled 2022-03-10: qty 30, 15d supply, fill #1

## 2022-02-11 ENCOUNTER — Other Ambulatory Visit (HOSPITAL_COMMUNITY): Payer: Self-pay

## 2022-02-25 ENCOUNTER — Ambulatory Visit (INDEPENDENT_AMBULATORY_CARE_PROVIDER_SITE_OTHER): Payer: PPO | Admitting: Diagnostic Neuroimaging

## 2022-02-25 ENCOUNTER — Telehealth: Payer: Self-pay | Admitting: Internal Medicine

## 2022-02-25 ENCOUNTER — Encounter: Payer: Self-pay | Admitting: Diagnostic Neuroimaging

## 2022-02-25 VITALS — BP 141/64 | HR 62 | Ht 61.0 in | Wt 194.0 lb

## 2022-02-25 DIAGNOSIS — F419 Anxiety disorder, unspecified: Secondary | ICD-10-CM

## 2022-02-25 DIAGNOSIS — R413 Other amnesia: Secondary | ICD-10-CM

## 2022-02-25 NOTE — Telephone Encounter (Signed)
Pt would like a callback regarding Zio Monitor. Pt states that she has developed blisters wear the monitor is. Please advise

## 2022-02-25 NOTE — Telephone Encounter (Signed)
Spoke to patient she stated she is unable to continue to wear monitor.She wore for 3 weeks.Stated she had to remove today,caused skin to be irritated red with blisters.Advised she can apply hydrocortisone cream to irritated skin. She will mail back monitor.I will make Dr.Hilty aware.

## 2022-02-25 NOTE — Progress Notes (Signed)
[facility]  PATIENT: [name] DOB: 1950-11-07  REFERRING CLINICIAN: Copland, [name], MD  HISTORY FROM: patient and husband REASON FOR VISIT: follow up   HISTORICAL  CHIEF COMPLAINT:  Chief Complaint  Patient presents with   Facial Pain    RM 7 with spouse [name]  Pt is well, reports tremors have worsen since last visit. Got placed on Klonopin and tremor did improve     HISTORY OF PRESENT ILLNESS:   UPDATE (02/25/22, VRP): Since last visit, doing well until some more stress and anxiety, and more shaking and memory loss issues. Now on clonazepam and doing better.  UPDATE (09/12/21, VRP): Since last visit, doing well until April 2023. Then intermittent tremors, neck pain, aphasia and stuttering. Had more sig events in June 2023, leading to ER eval. Now doing a little better. Has been under more stress lately. Also saw sports med for neck pain issues.   UPDATE (08/25/17, VRP): Since last visit, doing well. Dizziness, lightheadedness have improved but not fully resolved. Seems to be affected with overthinking, anxiety and rapid movements. Does better when she is focused on other activities. Walking is better. Driving on limited basis now. Overall doing well.   UPDATE (01/08/17, VRP): Since last visit, was doing well until Aug 15, 2016 --> fell and hit head and broke right knee. Now with headaches, anxiety, nausea issues. Now on prozac and feeling a little better.  UPDATE 09/09/14: Since last visit, doing better with memory and sleep apnea treatments. Having more issues with left foot/toe drop and back pain --> has MRI lumbar spine setup via ortho for reevaluation of known lumbar radiculopathy.   UPDATE 03/01/14: Since last visit, feeling better. Still with some stress, memory loss, but getting better. Had sleep study and dx'd with sleep apnea, planning to have titration study in Jan 2016 for CPAP.  PRIOR HPI (12/20/13): 71 year old right-handed female here for a  list of memory loss. Patient noticed memory problems since 02-15-13. Husband noticed problems since February 2015. Patient having short-term memory problems, poor concentration attention, in the setting of significantly increased psychosocial stressors. She has had more trouble driving and cooking recently.  Patient was taking care of her brother for the past one year who was diagnosed with pancreatic cancer. He deteriorated and ultimately passed away in February 15, 2014. Patient placed some blame on herself for his death, related to recommending morphine drip. During his funeral, patient stepped into a hole in the ground and developed left foot, ankle injury and plantar fascia rupture. Related to this patient was unable to work. She had to have premature retirement. This contributed to increasing stress, depression and anxiety. Patient also had a fall at the beats with head trauma, low back pain and disc bulging. Patient had lumbar spine surgery, L4, L5, S1 microdecompression 2014. Patient has family history of dementia in her mother and father.    REVIEW OF SYSTEMS: Full 14 system review of systems performed and negative except: memory loss sleepiness blurred vision tremor.     ALLERGIES: Allergies  Allergen Reactions   Crestor [Rosuvastatin] Other (See Comments)    myalgia   Niacin And Related Other (See Comments)    Whelps and skin flushed, mouth tingling    HOME MEDICATIONS: Outpatient Medications Prior to Visit  Medication Sig Dispense Refill   amLODipine (NORVASC) 10 MG tablet Take 1 tablet by mouth daily. 90 tablet 1   aspirin EC 81 MG EC tablet Take 1 tablet (81 mg total)  by mouth daily. 30 tablet 1   chlorpheniramine (CHLOR-TRIMETON) 4 MG tablet Take 1 tablet (4 mg total) by mouth daily. 14 tablet 3   cholecalciferol (VITAMIN D) 1000 UNITS tablet Take 1,000 Units by mouth daily.     clonazePAM (KLONOPIN) 0.5 MG tablet Take 0.5-1 tablets (0.25-0.5 mg total) by mouth 2 (two) times  daily as needed for anxiety. 30 tablet 1   clopidogrel (PLAVIX) 75 MG tablet TAKE 1 TABLET BY MOUTH DAILY. 90 tablet 3   cyclobenzaprine (FLEXERIL) 10 MG tablet Take 1 tablet (10 mg total) by mouth 2 (two) times daily as needed for muscle spasms. 20 tablet 0   denosumab (PROLIA) 60 MG/ML SOSY injection Inject 60 mg into the skin every 6 (six) months.     ezetimibe (ZETIA) 10 MG tablet TAKE 1 TABLET BY MOUTH ONCE A DAY 90 tablet 3   famotidine (PEPCID) 20 MG tablet Take 20 mg by mouth daily.     fenofibrate (TRICOR) 145 MG tablet Take 1 tablet by mouth daily. 90 tablet 3   FLUoxetine (PROZAC) 20 MG capsule Take 1 capsule (20 mg total) by mouth daily. 90 capsule 0   fluticasone (FLONASE) 50 MCG/ACT nasal spray Place 1 spray into both nostrils daily as needed for allergies or rhinitis. 16 g 11   HYDROcodone-acetaminophen (NORCO/VICODIN) 5-325 MG tablet Take 1 tablet by mouth every 8 (eight) hours as needed. 10 tablet 0   icosapent Ethyl (VASCEPA) 1 g capsule TAKE 2 CAPSULES BY MOUTH TWICE DAILY (Patient taking differently: Take 2 g by mouth daily.) 360 capsule 3   isosorbide mononitrate (IMDUR) 30 MG 24 hr tablet Take 1 tablet (30 mg total) by mouth daily. 90 tablet 2   Multiple Vitamin (MULTIVITAMIN WITH MINERALS) TABS tablet Take 1 tablet by mouth daily.     nitroGLYCERIN (NITROSTAT) 0.4 MG SL tablet Place 1 tablet under the tongue every 5 minutes as needed for chest pain. 25 tablet 2   RABEprazole (ACIPHEX) 20 MG tablet Take 1 tablet (20 mg total) by mouth daily 30 minutes prior to food 90 tablet 3   simvastatin (ZOCOR) 40 MG tablet Take 1 tablet (40 mg total) by mouth daily. 90 tablet 3   telmisartan (MICARDIS) 40 MG tablet TAKE 1 TABLET BY MOUTH EVERY MORNING * NEEDS OFFICE VISIT 90 tablet 2   metFORMIN (GLUCOPHAGE-XR) 500 MG 24 hr tablet Take 1 tablet  by mouth 2  times daily. 180 tablet 3   metFORMIN (GLUCOPHAGE-XR) 500 MG 24 hr tablet Take 1 tablet by mouth daily with breakfast. (Patient not  taking: Reported on 02/25/2022)     No facility-administered medications prior to visit.    PAST MEDICAL HISTORY: Past Medical History:  Diagnosis Date   Anemia    Arthritis    Bronchitis    hx of    CAD (coronary artery disease)    a. s/p DES to LAD in 2010 with patent stent by cath in 2014 b. low-risk NST in 11/2016   Cataracts, bilateral    Diabetes mellitus without complication (HCC)    Family history of adverse reaction to anesthesia    pts mother had difficulty awakening    GERD (gastroesophageal reflux disease)    Hyperlipidemia LDL goal < 70    Hypertension    Lumbar back pain    Numbness    left leg and foot    Plantar fascia rupture    Left Foot   Sleep apnea    uses CPAP  PAST SURGICAL HISTORY: Past Surgical History:  Procedure Laterality Date   ABDOMINAL HYSTERECTOMY     BACK SURGERY     BREAST CYST ASPIRATION  1995   CAROTID STENT  2009   pt denies    CORONARY ANGIOPLASTY WITH STENT PLACEMENT  2010and 10-02-2012   Stent DES, Xience to prox. LAD   DOPPLER ECHOCARDIOGRAPHY  08/01/2009   EF=>55%,LV normal   LEFT HEART CATH AND CORONARY ANGIOGRAPHY N/A 12/10/2019   Procedure: LEFT HEART CATH AND CORONARY ANGIOGRAPHY;  Surgeon: Lyn Records, MD;  Location: [facility];  Service: Cardiovascular;  Laterality: N/A;   LEFT HEART CATH AND CORONARY ANGIOGRAPHY N/A 10/30/2020   Procedure: LEFT HEART CATH AND CORONARY ANGIOGRAPHY;  Surgeon: Runell Gess, MD;  Location: [facility];  Service: Cardiovascular;  Laterality: N/A;   LEFT HEART CATHETERIZATION WITH CORONARY ANGIOGRAM N/A 10/02/2012   Procedure: LEFT HEART CATHETERIZATION WITH CORONARY ANGIOGRAM;  Surgeon: Runell Gess, MD;  Location: [facility];  Service: Cardiovascular;  Laterality: N/A;   lower arterial duplex  06/20/10   abi's normal,rgt 0.98,lft 1.06;bilateral PVRs normal   LUMBAR LAMINECTOMY/DECOMPRESSION MICRODISCECTOMY Left 01/06/2013   Procedure: MICRO LUMBAR DECOMPRESSION  L4-5 AND L5-S1;  Surgeon: Javier Docker, MD;  Location: [facility];  Service: Orthopedics;  Laterality: Left;   LUMBAR LAMINECTOMY/DECOMPRESSION MICRODISCECTOMY Left 10/12/2014   Procedure: REVISION MICRO LUMBAR/DECOMPRESSION L4-5 LEFT ;  Surgeon: Jene Every, MD;  Location: [facility];  Service: Orthopedics;  Laterality: Left;   NM MYOCAR PERF WALL MOTION  09/22/2008   lexiscan-EF 83%; glogal LV systolic fx is norm. ,evidence of mild ischemia basal anterior,midanterior and apical lateral region(s).    ORIF PATELLA Right 08/29/2016   Procedure: OPEN REDUCTION INTERNAL (ORIF) FIXATION RIGHT PATELLA;  Surgeon: Samson Frederic, MD;  Location: [facility];  Service: Orthopedics;  Laterality: Right;  Adductor Block   TUBAL LIGATION     TYMPANOPLASTY Bilateral    UVULOPALATOPHARYNGOPLASTY     pt denies     FAMILY HISTORY: Family History  Problem Relation Age of Onset   Coronary artery disease Mother    Rheum arthritis Mother    Dementia Mother    Heart attack Father    Hypertension Father    Hyperlipidemia Father    Other Father        MVA   Hypertension Brother    Cancer Brother    Heart disease Brother    Cancer Paternal Grandmother        stomach   Diabetes Paternal Grandfather    Breast cancer Neg Hx     SOCIAL HISTORY:  Social History   Socioeconomic History   Marital status: Married    Spouse name: [name]   Number of children: 2   Years of education: MSN   Highest education level: Not on file  Occupational History   Occupation: DIRECTOR    Employer: [employer]  Tobacco Use   Smoking status: Never   Smokeless tobacco: Never  Vaping Use   Vaping Use: Never used  Substance and Sexual Activity   Alcohol use: Yes    Comment: occasional, 1 a month wine   Drug use: No   Sexual activity: Yes  Other Topics Concern   Not on file  Social History Narrative   Married to [place]. Education: [employer]. Exercise: Yes   Patient lives at home with her spouse. retired   Caffeine use: 1  drink of tea a day   Social Determinants of Health  Financial Resource Strain: Medium Risk (08/14/2021)   Overall Financial Resource Strain (CARDIA)    Difficulty of Paying Living Expenses: Somewhat hard  Food Insecurity: Not on file  Transportation Needs: Not on file  Physical Activity: Insufficiently Active (09/27/2020)   Exercise Vital Sign    Days of Exercise per Week: 3 days    Minutes of Exercise per Session: 30 min  Stress: Not on file  Social Connections: Not on file  Intimate Partner Violence: Not on file     PHYSICAL EXAM   GENERAL EXAM/CONSTITUTIONAL: Vitals:  Vitals:   02/25/22 1604  BP: (!) 141/64  Pulse: 62  Weight: 194 lb (88 kg)  Height: 5\' 1"  (1.549 m)   Body mass index is 36.66 kg/m. Wt Readings from Last 3 Encounters:  02/25/22 194 lb (88 kg)  02/06/22 192 lb 12.8 oz (87.5 kg)  01/25/22 196 lb 3.2 oz (89 kg)   Patient is in no distress; well developed, nourished and groomed; neck HAS  supple  CARDIOVASCULAR: Examination of carotid arteries is normal; no carotid bruits Regular rate and rhythm, no murmurs Examination of peripheral vascular system by observation and palpation is normal  EYES: Ophthalmoscopic exam of optic discs and posterior segments is normal; no papilledema or hemorrhages No results found.  MUSCULOSKELETAL: Gait, strength, tone, movements noted in Neurologic exam below  NEUROLOGIC: MENTAL STATUS:     12/20/2013   10:44 AM  MMSE - Mini Mental State Exam  Orientation to time 5  Orientation to Place 5  Registration 3  Attention/ Calculation 5  Recall 3  Language- name 2 objects 2  Language- repeat 1  Language- follow 3 step command 3  Language- read & follow direction 1  Write a sentence 1  Copy design 1  Total score 30   awake, alert, oriented to person, place and time recent and remote memory intact normal attention and concentration language fluent, comprehension intact, naming intact fund of knowledge  appropriate  CRANIAL NERVE:  2nd - no papilledema on fundoscopic exam 2nd, 3rd, 4th, 6th - pupils equal and reactive to light, visual fields full to confrontation, extraocular muscles intact, no nystagmus 5th - facial sensation symmetric 7th - facial strength symmetric 8th - hearing intact 9th - palate elevates symmetrically, uvula midline 11th - shoulder shrug symmetric 12th - tongue protrusion midline  MOTOR:  normal bulk and tone, full strength in the BUE, BLE  SENSORY:  normal and symmetric to light touch, temperature, vibration  COORDINATION:  finger-nose-finger, fine finger movements normal  REFLEXES:  deep tendon reflexes TRACE and symmetric  GAIT/STATION:  narrow based gait    DIAGNOSTIC DATA (LABS, IMAGING, TESTING) - I reviewed patient records, labs, notes, testing and imaging myself where available.  Lab Results  Component Value Date   WBC 10.1 12/27/2021   HGB 11.5 (L) 12/27/2021   HCT 35.5 (L) 12/27/2021   MCV 80.1 12/27/2021   PLT 368.0 12/27/2021      Component Value Date/Time   NA 143 01/25/2022 1202   K 4.5 01/25/2022 1202   CL 105 01/25/2022 1202   CO2 23 01/25/2022 1202   GLUCOSE 126 (H) 01/25/2022 1202   GLUCOSE 125 (H) 09/04/2021 1250   BUN 16 01/25/2022 1202   CREATININE 1.06 (H) 01/25/2022 1202   CREATININE 0.82 09/29/2014 0910   CALCIUM 10.0 01/25/2022 1202   PROT 6.9 01/25/2022 1202   ALBUMIN 4.4 01/25/2022 1202   AST 17 01/25/2022 1202   ALT 14 01/25/2022 1202   ALKPHOS  50 01/25/2022 1202   BILITOT 0.4 01/25/2022 1202   GFRNONAA >60 09/04/2021 1250   GFRAA >60 11/18/2019 1418   Lab Results  Component Value Date   CHOL 180 01/25/2022   HDL 40 01/25/2022   LDLCALC 112 (H) 01/25/2022   LDLDIRECT 82.0 11/16/2018   TRIG 158 (H) 01/25/2022   CHOLHDL 4.5 (H) 01/25/2022   Lab Results  Component Value Date   HGBA1C 7.8 (H) 12/27/2021   Lab Results  Component Value Date   VITAMINB12 440 12/27/2021   Lab Results  Component  Value Date   TSH 2.43 12/27/2021    12/07/12 CT head - Negative head CT.  12/07/12 CT cervical spine - No acute bony injury in the cervical spine.  12/29/13 MRI brain 1. Few subcortical and juxtacortical foci of non-specific gliosis, likely chronic small vessel ischemic disease.    2. No acute findings.  12/28/16 MRI brain  - No cause of the presenting symptoms is identified. No post traumatic finding. Normal except for a few punctate foci of T2 and FLAIR signal within the hemispheric white matter, probably subclinical.  09/03/21 MRI brain - No evidence of acute intracranial abnormality.     ASSESSMENT AND PLAN  71 y.o. year old female here with short term memory loss, poor attention/focus, since Nov 2014, in setting of increased personal stress (brother's illness and death, left foot injury, inability to work and premature retirement).   Then had fall and head injury in May 2018, with mild concussion and now post-concussion syndrome.   Then had neck pain, tremors, abnl spell since April-June 2023.   Dx:   1. Memory loss   2. Anxiety     PLAN:  TREMOR, NECK PAIN, APHASIA, STUTTERING, MEMORY LOSS - likely related to anxiety disorder and stress reaction - MRI brain unremarkable - continue to gradually increase activity - reviewed nutrition, fitness, sleep and relaxation techniques - follow up with PCP re: anxiety; will refer to psychology follow up; continue prozac and clonazepam  Orders Placed This Encounter  Procedures   Ambulatory referral to Psychology   Return for pending if symptoms worsen or fail to improve, return to PCP.    Suanne Marker, MD 02/25/2022, 4:36 PM Certified in Neurology, Neurophysiology and Neuroimaging  [facility] [address], [address] [place], [place] [address] [phone]

## 2022-03-05 ENCOUNTER — Ambulatory Visit: Payer: PPO | Admitting: Diagnostic Neuroimaging

## 2022-03-10 ENCOUNTER — Other Ambulatory Visit: Payer: Self-pay | Admitting: Family Medicine

## 2022-03-10 ENCOUNTER — Other Ambulatory Visit (HOSPITAL_BASED_OUTPATIENT_CLINIC_OR_DEPARTMENT_OTHER): Payer: Self-pay | Admitting: Internal Medicine

## 2022-03-10 ENCOUNTER — Other Ambulatory Visit: Payer: Self-pay | Admitting: Internal Medicine

## 2022-03-10 DIAGNOSIS — I25118 Atherosclerotic heart disease of native coronary artery with other forms of angina pectoris: Secondary | ICD-10-CM

## 2022-03-11 ENCOUNTER — Other Ambulatory Visit: Payer: Self-pay | Admitting: Family Medicine

## 2022-03-11 ENCOUNTER — Other Ambulatory Visit: Payer: Self-pay

## 2022-03-11 ENCOUNTER — Other Ambulatory Visit (HOSPITAL_COMMUNITY): Payer: Self-pay

## 2022-03-11 DIAGNOSIS — R251 Tremor, unspecified: Secondary | ICD-10-CM

## 2022-03-11 MED ORDER — FAMOTIDINE 20 MG PO TABS
20.0000 mg | ORAL_TABLET | Freq: Every day | ORAL | 3 refills | Status: DC
  Filled 2022-03-11: qty 90, 90d supply, fill #1
  Filled 2022-03-11: qty 90, 90d supply, fill #0
  Filled 2022-08-12: qty 90, 90d supply, fill #1
  Filled 2022-08-31 – 2023-02-27 (×2): qty 90, 90d supply, fill #2

## 2022-03-11 MED ORDER — AMLODIPINE BESYLATE 10 MG PO TABS
10.0000 mg | ORAL_TABLET | Freq: Every day | ORAL | 1 refills | Status: DC
  Filled 2022-03-11: qty 90, 90d supply, fill #0
  Filled 2022-08-26: qty 90, 90d supply, fill #1

## 2022-03-12 ENCOUNTER — Other Ambulatory Visit: Payer: Self-pay

## 2022-03-12 ENCOUNTER — Other Ambulatory Visit (HOSPITAL_COMMUNITY): Payer: Self-pay

## 2022-03-12 MED ORDER — ISOSORBIDE MONONITRATE ER 30 MG PO TB24
30.0000 mg | ORAL_TABLET | Freq: Every day | ORAL | 2 refills | Status: DC
  Filled 2022-03-12: qty 90, 90d supply, fill #0
  Filled 2022-06-07: qty 90, 90d supply, fill #1
  Filled 2022-09-09: qty 90, 90d supply, fill #2

## 2022-03-12 MED ORDER — TELMISARTAN 40 MG PO TABS
40.0000 mg | ORAL_TABLET | Freq: Every day | ORAL | 3 refills | Status: DC
Start: 2022-03-12 — End: 2023-04-11
  Filled 2022-03-12 – 2022-03-27 (×2): qty 90, 90d supply, fill #0
  Filled 2022-05-01 – 2022-05-14 (×4): qty 90, 90d supply, fill #1
  Filled 2022-05-14: qty 60, 60d supply, fill #1
  Filled 2022-06-07: qty 90, 90d supply, fill #1
  Filled 2022-11-22: qty 90, 90d supply, fill #2

## 2022-03-12 MED ORDER — CLONAZEPAM 0.5 MG PO TABS
0.2500 mg | ORAL_TABLET | Freq: Two times a day (BID) | ORAL | 1 refills | Status: DC | PRN
  Filled 2022-03-12 – 2022-04-24 (×2): qty 30, 15d supply, fill #0
  Filled 2022-06-07: qty 30, 15d supply, fill #1

## 2022-03-26 NOTE — Progress Notes (Signed)
Cardiology Clinic Note   Patient Name: [name] Date of Encounter: 03/27/2022  Primary Care Provider:  Pearline Cables, MD Primary Cardiologist:  Chrystie Nose, MD  Patient Profile    [name] is a 72 y.o. female with a past medical history of CAD s/p PCI with DES to LAD 2010, hypertension, hyperlipidemia, T2DM, OSA who presents to the clinic today for 2 month follow-up.   Past Medical History    Past Medical History:  Diagnosis Date   Anemia    Arthritis    Bronchitis    hx of    CAD (coronary artery disease)    a. s/p DES to LAD in 2010 with patent stent by cath in 2014 b. low-risk NST in 11/2016   Cataracts, bilateral    Diabetes mellitus without complication (HCC)    Family history of adverse reaction to anesthesia    pts mother had difficulty awakening    GERD (gastroesophageal reflux disease)    Hyperlipidemia LDL goal < 70    Hypertension    Lumbar back pain    Numbness    left leg and foot    Plantar fascia rupture    Left Foot   Sleep apnea    uses CPAP    Past Surgical History:  Procedure Laterality Date   ABDOMINAL HYSTERECTOMY     BACK SURGERY     BREAST CYST ASPIRATION  1995   CAROTID STENT  2009   pt denies    CORONARY ANGIOPLASTY WITH STENT PLACEMENT  2010and 10-02-2012   Stent DES, Xience to prox. LAD   DOPPLER ECHOCARDIOGRAPHY  08/01/2009   EF=>55%,LV normal   LEFT HEART CATH AND CORONARY ANGIOGRAPHY N/A 12/10/2019   Procedure: LEFT HEART CATH AND CORONARY ANGIOGRAPHY;  Surgeon: Lyn Records, MD;  Location: [facility];  Service: Cardiovascular;  Laterality: N/A;   LEFT HEART CATH AND CORONARY ANGIOGRAPHY N/A 10/30/2020   Procedure: LEFT HEART CATH AND CORONARY ANGIOGRAPHY;  Surgeon: Runell Gess, MD;  Location: [facility];  Service: Cardiovascular;  Laterality: N/A;   LEFT HEART CATHETERIZATION WITH CORONARY ANGIOGRAM N/A 10/02/2012   Procedure: LEFT HEART CATHETERIZATION WITH CORONARY ANGIOGRAM;  Surgeon: Runell Gess, MD;  Location: [facility] CATH LAB;  Service: Cardiovascular;  Laterality: N/A;   lower arterial duplex  06/20/10   abi's normal,rgt 0.98,lft 1.06;bilateral PVRs normal   LUMBAR LAMINECTOMY/DECOMPRESSION MICRODISCECTOMY Left 01/06/2013   Procedure: MICRO LUMBAR DECOMPRESSION L4-5 AND L5-S1;  Surgeon: Javier Docker, MD;  Location: [facility];  Service: Orthopedics;  Laterality: Left;   LUMBAR LAMINECTOMY/DECOMPRESSION MICRODISCECTOMY Left 10/12/2014   Procedure: REVISION MICRO LUMBAR/DECOMPRESSION L4-5 LEFT ;  Surgeon: Jene Every, MD;  Location: [facility];  Service: Orthopedics;  Laterality: Left;   NM MYOCAR PERF WALL MOTION  09/22/2008   lexiscan-EF 83%; glogal LV systolic fx is norm. ,evidence of mild ischemia basal anterior,midanterior and apical lateral region(s).    ORIF PATELLA Right 08/29/2016   Procedure: OPEN REDUCTION INTERNAL (ORIF) FIXATION RIGHT PATELLA;  Surgeon: Samson Frederic, MD;  Location: [facility];  Service: Orthopedics;  Laterality: Right;  Adductor Block   TUBAL LIGATION     TYMPANOPLASTY Bilateral    UVULOPALATOPHARYNGOPLASTY     pt denies     Allergies  Allergies  Allergen Reactions   Crestor [Rosuvastatin] Other (See Comments)    myalgia   Niacin And Related Other (See Comments)    Whelps and skin flushed, mouth tingling  History of Present Illness    [name] has a past medical history of: CAD.  Stress test 09/22/2008: Evidence of mild ischemia in the basal anterior, mid anterior and apical regions. LHC 10/12/2008 after positive stress test: Proximal LAD 85%.  PCI with DES proximal LAD.  Ostial LCx percent, proximal and mid LCx 20%.  Mid RCA 20%. LHC 10/02/2012: Patent proximal LAD stent.  Otherwise normal coronaries. Stress test 90 2021: Large defect of moderate severity present in the mid anterior, mid inferior, apical anterior, apical inferior and apex location consistent with ischemia. LHC 12/10/2019: Patent proximal LAD stent.  Nonobstructive CAD.   Recommend as needed SL NTG consider calcium channel blocker for micro circulatory dysfunction. Echo 12/13/2019: EF 60 to 65%.  Moderate asymmetric left ventricular hypertrophy of the basal-septal segment.  Trivial MR.  Mild calcification/thickening of the aortic valve. Stress test 10/24/2020: Normal low risk study. Echo 10/27/2020: EF 60 to 65%.  Moderate asymmetric left ventricular hypertrophy.  Trivial MR.  Mild thickening of the aortic valve. LHC 10/30/2020: Patent proximal LAD stent.  Otherwise nonobstructive CAD. Bradycardia/presyncope.  30-day event monitor 03/05/2022: Normal. Hypertension. Hyperlipidemia.  Lipid panel 01/25/2022: LDL 112, HDL 40, TG 158, total 180. T2DM. A1c 12/27/2021: 7.8. OSA.   [name] is a long time patient of cardiology.  She had an abnormal stress test in July 2010 with LHC resulting in PCI with DES to proximal LAD.  Catheterization in 2014, 2021, 2022 showed patent proximal LAD stent and nonobstructive CAD.  Was last seen in the office by Dr. Debara Pickett on 01/25/2022 for dizziness and presyncope.  Patient reported episodes of feeling unsteady and presyncopal.  She has concerns about bradycardia secondary to her mom needing a pacemaker.  At that visit heart rate was 47.  Metoprolol was stopped and 30-day event monitor was normal.  Today, patient is doing well. She is no longer having episodes of feeling unsteady and presyncopal. She stays active by walking in her yard, doing housework and walking around [place]. Patient denies shortness of breath or dyspnea on exertion. No chest pain, pressure, or tightness. Denies lower extremity edema, orthopnea, or PND. No palpitations.    Home Medications    Current Meds  Medication Sig   amLODipine (NORVASC) 10 MG tablet Take 1 tablet (10 mg total) by mouth daily.   aspirin EC 81 MG EC tablet Take 1 tablet (81 mg total) by mouth daily.   chlorpheniramine (CHLOR-TRIMETON) 4 MG tablet Take 1 tablet (4 mg total) by mouth daily.    cholecalciferol (VITAMIN D) 1000 UNITS tablet Take 1,000 Units by mouth daily.   clonazePAM (KLONOPIN) 0.5 MG tablet Take 0.5-1 tablets (0.25-0.5 mg total) by mouth 2 (two) times daily as needed for anxiety.   clopidogrel (PLAVIX) 75 MG tablet TAKE 1 TABLET BY MOUTH DAILY.   cyclobenzaprine (FLEXERIL) 10 MG tablet Take 1 tablet (10 mg total) by mouth 2 (two) times daily as needed for muscle spasms.   denosumab (PROLIA) 60 MG/ML SOSY injection Inject 60 mg into the skin every 6 (six) months.   ezetimibe (ZETIA) 10 MG tablet TAKE 1 TABLET BY MOUTH ONCE A DAY   famotidine (PEPCID) 20 MG tablet Take 1 tablet (20 mg total) by mouth daily.   fenofibrate (TRICOR) 145 MG tablet Take 1 tablet by mouth daily.   FLUoxetine (PROZAC) 20 MG capsule Take 1 capsule (20 mg total) by mouth daily.   fluticasone (FLONASE) 50 MCG/ACT nasal spray Place 1 spray into both nostrils daily as  needed for allergies or rhinitis.   HYDROcodone-acetaminophen (NORCO/VICODIN) 5-325 MG tablet Take 1 tablet by mouth every 8 (eight) hours as needed.   icosapent Ethyl (VASCEPA) 1 g capsule TAKE 2 CAPSULES BY MOUTH TWICE DAILY (Patient taking differently: Take 2 g by mouth daily.)   isosorbide mononitrate (IMDUR) 30 MG 24 hr tablet Take 1 tablet (30 mg total) by mouth daily.   Multiple Vitamin (MULTIVITAMIN WITH MINERALS) TABS tablet Take 1 tablet by mouth daily.   nitroGLYCERIN (NITROSTAT) 0.4 MG SL tablet Place 1 tablet under the tongue every 5 minutes as needed for chest pain.   RABEprazole (ACIPHEX) 20 MG tablet Take 1 tablet (20 mg total) by mouth daily 30 minutes prior to food   simvastatin (ZOCOR) 40 MG tablet Take 1 tablet (40 mg total) by mouth daily.   telmisartan (MICARDIS) 40 MG tablet Take 1 tablet (40 mg total) by mouth daily. Please keep scheduled appointment    Family History    Family History  Problem Relation Age of Onset   Coronary artery disease Mother    Rheum arthritis Mother    Dementia Mother    Heart  attack Father    Hypertension Father    Hyperlipidemia Father    Other Father        MVA   Hypertension Brother    Cancer Brother    Heart disease Brother    Cancer Paternal Grandmother        stomach   Diabetes Paternal Grandfather    Breast cancer Neg Hx    She indicated that her mother is deceased. She indicated that her father is deceased. She indicated that both of her sisters are alive. She indicated that two of her three brothers are alive. She indicated that her maternal grandmother is deceased. She indicated that her maternal grandfather is deceased. She indicated that her paternal grandmother is deceased. She indicated that her paternal grandfather is deceased. She indicated that her daughter is alive. She indicated that her son is alive. She indicated that the status of her neg hx is unknown.   Social History    Social History   Socioeconomic History   Marital status: Married    Spouse name: Mortimer Fries   Number of children: 2   Years of education: MSN   Highest education level: Not on file  Occupational History   Occupation: DIRECTOR    Employer: [employer]  Tobacco Use   Smoking status: Never   Smokeless tobacco: Never  Vaping Use   Vaping Use: Never used  Substance and Sexual Activity   Alcohol use: Yes    Comment: occasional, 1 a month wine   Drug use: No   Sexual activity: Yes  Other Topics Concern   Not on file  Social History Narrative   Married to [place] [facility] [place]. Education: [employer]. Exercise: Yes   Patient lives at home with her spouse. retired   Caffeine use: 1 drink of tea a day   Social Determinants of Health   Financial Resource Strain: Medium Risk (08/14/2021)   Overall Financial Resource Strain (CARDIA)    Difficulty of Paying Living Expenses: Somewhat hard  Food Insecurity: Not on file  Transportation Needs: Not on file  Physical Activity: Insufficiently Active (09/27/2020)   Exercise Vital Sign    Days of Exercise per Week: 3 days    Minutes of  Exercise per Session: 30 min  Stress: Not on file  Social Connections: Not on file  Intimate Partner Violence: Not on file  Review of Systems    General:  No chills, fever, night sweats or weight changes.  Cardiovascular:  No chest pain, dyspnea on exertion, edema, orthopnea, palpitations, paroxysmal nocturnal dyspnea. Dermatological: No rash, lesions/masses Respiratory: No cough, dyspnea Urologic: No hematuria, dysuria Abdominal:   No nausea, vomiting, diarrhea, bright red blood per rectum, melena, or hematemesis Neurologic:  No visual changes, weakness, changes in mental status. All other systems reviewed and are otherwise negative except as noted above.  Physical Exam    VS:  BP (!) 128/50 (BP Location: Left Arm, Patient Position: Sitting, Cuff Size: Normal)   Pulse 68   Ht 5' 1.5" (1.562 m)   Wt 190 lb (86.2 kg)   LMP  (LMP Unknown)   BMI 35.32 kg/m  , BMI Body mass index is 35.32 kg/m. GEN:  Well nourished, well developed, in no acute distress. HEENT: Normal. Neck: Supple, no JVD, carotid bruits, or masses. Cardiac: RRR, no murmurs, rubs, or gallops. No clubbing, cyanosis, edema.  Radials/DP/PT 2+ and equal bilaterally.  Respiratory:  Respirations regular and unlabored, clear to auscultation bilaterally. GI: Soft, nontender, nondistended. MS: No deformity or atrophy. Skin: Warm and dry, no rash. Neuro: Strength and sensation are intact. Psych: Normal affect.  Accessory Clinical Findings   Recent Labs: 12/27/2021: Hemoglobin 11.5; Platelets 368.0; TSH 2.43 01/25/2022: ALT 14; BUN 16; Creatinine, Ser 1.06; Potassium 4.5; Sodium 143   Recent Lipid Panel    Component Value Date/Time   CHOL 180 01/25/2022 1202   TRIG 158 (H) 01/25/2022 1202   HDL 40 01/25/2022 1202   CHOLHDL 4.5 (H) 01/25/2022 1202   CHOLHDL 6.3 (H) 12/20/2019 0945   VLDL 30.8 07/01/2019 0921   LDLCALC 112 (H) 01/25/2022 1202   LDLCALC  12/20/2019 0945     Comment:     . LDL cholesterol not  calculated. Triglyceride levels greater than 400 mg/dL invalidate calculated LDL results. . Reference range: <100 . Desirable range <100 mg/dL for primary prevention;   <70 mg/dL for patients with CHD or diabetic patients  with > or = 2 CHD risk factors. Marland Kitchen LDL-C is now calculated using the Martin-Hopkins  calculation, which is a validated novel method providing  better accuracy than the Friedewald equation in the  estimation of LDL-C.  Horald Pollen et al. Lenox Ahr. 7829;562(13): 2061-2068  (http://education.[facility].com/faq/FAQ164)    LDLDIRECT 82.0 11/16/2018 1010       ECG not indicated today.   Assessment & Plan   CAD.  LHC August 2022 showed patent proximal LAD stent and nonobstructive CAD.  Patient denies chest pain, tightness or pressure. No shortness of breath or DOE.  Continue amlodipine, aspirin, Plavix, Zetia, simvastatin, isosorbide, as needed SL NTG. Bradycardia/presyncope.  Heart rate today 68. Patient denies further episodes of unsteadiness or presyncope. Low-dose beta-blocker was stopped 2 months ago. Will not restart beta-blocker at this time.  Hypertension.  BP today 128/50. Patient denies headaches or dizziness. Continue amlodipine and telmesartan.  Hyperlipidemia.  LDL November 2023 112. Patient reports she has worked closely with Dr. Rennis Golden for years to get her lipids under control. Continue simvastatin, Zetia, Tricor, and Vascepa.      Disposition: Patient will follow-up with Dr. Rennis Golden per recall.    Etta Grandchild. Markavious Micco, DNP, NP-C     03/27/2022, 11:23 AM [facility] [address] Office [phone] Fax [phone]

## 2022-03-27 ENCOUNTER — Other Ambulatory Visit: Payer: Self-pay | Admitting: Family Medicine

## 2022-03-27 ENCOUNTER — Ambulatory Visit: Payer: PPO | Attending: Nurse Practitioner | Admitting: Student

## 2022-03-27 ENCOUNTER — Encounter: Payer: Self-pay | Admitting: Nurse Practitioner

## 2022-03-27 ENCOUNTER — Other Ambulatory Visit (HOSPITAL_COMMUNITY): Payer: Self-pay

## 2022-03-27 VITALS — BP 128/50 | HR 68 | Ht 61.5 in | Wt 190.0 lb

## 2022-03-27 DIAGNOSIS — I10 Essential (primary) hypertension: Secondary | ICD-10-CM | POA: Diagnosis not present

## 2022-03-27 DIAGNOSIS — I2510 Atherosclerotic heart disease of native coronary artery without angina pectoris: Secondary | ICD-10-CM | POA: Diagnosis not present

## 2022-03-27 DIAGNOSIS — Z1231 Encounter for screening mammogram for malignant neoplasm of breast: Secondary | ICD-10-CM

## 2022-03-27 DIAGNOSIS — R001 Bradycardia, unspecified: Secondary | ICD-10-CM | POA: Diagnosis not present

## 2022-03-27 DIAGNOSIS — E785 Hyperlipidemia, unspecified: Secondary | ICD-10-CM

## 2022-03-27 DIAGNOSIS — F411 Generalized anxiety disorder: Secondary | ICD-10-CM

## 2022-03-27 NOTE — Patient Instructions (Signed)
Medication Instructions:  Your physician recommends that you continue on your current medications as directed. Please refer to the Current Medication list given to you today.   *If you need a refill on your cardiac medications before your next appointment, please call your pharmacy*   Lab Work: NONE ordered at this time of appointment   If you have labs (blood work) drawn today and your tests are completely normal, you will receive your results only by: [place] (if you have MyChart) OR A paper copy in the mail If you have any lab test that is abnormal or we need to change your treatment, we will call you to review the results.   Testing/Procedures: NONE ordered at this time of appointment     Follow-Up: At [facility], you and your health needs are our priority.  As part of our continuing mission to provide you with exceptional heart care, we have created designated Provider Care Teams.  These Care Teams include your primary Cardiologist (physician) and Advanced Practice Providers (APPs -  Physician Assistants and Nurse Practitioners) who all work together to provide you with the care you need, when you need it.  We recommend signing up for the patient portal called "MyChart".  Sign up information is provided on this After Visit Summary.  MyChart is used to connect with patients for Virtual Visits (Telemedicine).  Patients are able to view lab/test results, encounter notes, upcoming appointments, etc.  Non-urgent messages can be sent to your provider as well.   To learn more about what you can do with MyChart, go to [email or web address].    Your next appointment:   As needed   The format for your next appointment:   In Person  Provider:   Pixie Casino, MD     Other Instructions   Important Information About Sugar

## 2022-03-28 ENCOUNTER — Other Ambulatory Visit (HOSPITAL_COMMUNITY): Payer: Self-pay

## 2022-03-28 MED ORDER — FLUOXETINE HCL 20 MG PO CAPS
20.0000 mg | ORAL_CAPSULE | Freq: Every day | ORAL | 0 refills | Status: DC
  Filled 2022-03-28 – 2022-06-07 (×2): qty 90, 90d supply, fill #0

## 2022-03-29 ENCOUNTER — Other Ambulatory Visit (HOSPITAL_COMMUNITY): Payer: Self-pay

## 2022-04-03 ENCOUNTER — Ambulatory Visit: Payer: PPO | Admitting: Pharmacist

## 2022-04-03 ENCOUNTER — Other Ambulatory Visit: Payer: Self-pay

## 2022-04-03 ENCOUNTER — Other Ambulatory Visit: Payer: Self-pay | Admitting: Family Medicine

## 2022-04-03 ENCOUNTER — Other Ambulatory Visit (HOSPITAL_COMMUNITY): Payer: Self-pay

## 2022-04-03 ENCOUNTER — Telehealth: Payer: Self-pay | Admitting: Pharmacist

## 2022-04-03 DIAGNOSIS — I2510 Atherosclerotic heart disease of native coronary artery without angina pectoris: Secondary | ICD-10-CM

## 2022-04-03 DIAGNOSIS — M8580 Other specified disorders of bone density and structure, unspecified site: Secondary | ICD-10-CM

## 2022-04-03 DIAGNOSIS — R12 Heartburn: Secondary | ICD-10-CM

## 2022-04-03 DIAGNOSIS — E785 Hyperlipidemia, unspecified: Secondary | ICD-10-CM

## 2022-04-03 MED ORDER — RABEPRAZOLE SODIUM 20 MG PO TBEC
20.0000 mg | DELAYED_RELEASE_TABLET | Freq: Every day | ORAL | 3 refills | Status: DC
  Filled 2022-04-03: qty 90, 90d supply, fill #0
  Filled 2022-08-26: qty 90, 90d supply, fill #1

## 2022-04-03 NOTE — Telephone Encounter (Signed)
Pharmacy Patient Advocate Encounter  Insurance verification completed.    The patient is insured through [employer]   Ran test claims for: Prolia 60mg.  Pharmacy benefit copay: $200.00  

## 2022-04-03 NOTE — Telephone Encounter (Signed)
Please check benefits

## 2022-04-03 NOTE — Telephone Encounter (Signed)
Prolia VOB initiated via [email or web address] 

## 2022-04-03 NOTE — Progress Notes (Signed)
Pharmacy Note  04/03/2022 Name: SATORI KRABILL MRN: [id number] DOB: [birth year]  Subjective: [name] is a 72 y.o. year old female who is a primary care patient of Copland, [name], MD. Clinical Pharmacist Practitioner referral was placed to assist with medication management.    Engaged with patient by telephone for follow up visit today.   Mixed hyperlipidemia -  Patient sees Dr Rennis Golden, cardiology for lipid management. Lipids have improved by not at goal yet. IN 2023 we assisted patient with application for [employer] due to cost of generic Vascepa. Kennedy Bucker will end 04/17/2022. Patient's current anti-lipid treatment is: simvastatin 40mg  daily, Vascepa 1 gram - 2 daily, fenofibrate 145mg  daily and ezetimibe 10mg  daily. Patient denies missed doses.  She was not able to tolerate rosuvastatin or higher dose of Vascepa.  Osteopenia with high fracture risk - started Prolia January 2023. Last Prolia injection was 10/31/2021 - next due 02/09/2024Cost was $295. Checked to see if post menopausal osteoporosis [employer] February 2023 is open but no current funding available for osteoporosis. Will continue to monitor.  Medication Management: Discussed adherence and reviewed recent refill records.   Patient is using 12/31/2021 for mail order prescription. Patient has filled meds on time over the last 3 to 4 months.   SDOH (Social Determinants of Health) assessments and interventions performed:  SDOH Interventions    Flowsheet Row Chronic Care Management from 08/14/2021 in [facility]  SDOH Interventions   Financial Strain Interventions Other (Comment)  [cost of Vascepa high - applied for 08/16/2021 and approved thru 03/2022]        Objective: Review of patient status, including review of consultants reports, laboratory and other test data, was performed as part of comprehensive.  Lab Results  Component  Value Date   CREATININE 1.06 (H) 01/25/2022   CREATININE 0.98 09/04/2021   CREATININE 1.10 (H) 09/03/2021    Lab Results  Component Value Date   HGBA1C 7.8 (H) 12/27/2021       Component Value Date/Time   CHOL 180 01/25/2022 1202   TRIG 158 (H) 01/25/2022 1202   HDL 40 01/25/2022 1202   CHOLHDL 4.5 (H) 01/25/2022 1202   CHOLHDL 6.3 (H) 12/20/2019 0945   VLDL 30.8 07/01/2019 0921   LDLCALC 112 (H) 01/25/2022 1202   LDLCALC  12/20/2019 0945     Comment:     . LDL cholesterol not calculated. Triglyceride levels greater than 400 mg/dL invalidate calculated LDL results. . Reference range: <100 . Desirable range <100 mg/dL for primary prevention;   <70 mg/dL for patients with CHD or diabetic patients  with > or = 2 CHD risk factors. 08/31/2019 LDL-C is now calculated using the Martin-Hopkins  calculation, which is a validated novel method providing  better accuracy than the Friedewald equation in the  estimation of LDL-C.  13/05/2021 et al. 12/22/2019. Marland Kitchen): 2061-2068  (http://education.[facility].com/faq/FAQ164)    LDLDIRECT 82.0 11/16/2018 1010     Clinical ASCVD: Yes  The ASCVD Risk score (Arnett DK, et al., 2019) failed to calculate for the following reasons:   The patient has a prior MI or stroke diagnosis    BP Readings from Last 3 Encounters:  03/27/22 (!) 128/50  02/25/22 (!) 141/64  02/06/22 132/60     Allergies  Allergen Reactions   Crestor [Rosuvastatin] Other (See Comments)    myalgia   Niacin And Related Other (See Comments)    Whelps and skin  flushed, mouth tingling    Medications Reviewed Today     Reviewed by [name], RPH-CPP (Pharmacist) on 04/03/22 at 1128  Med List Status: <None>   Medication Order Taking? Sig Documenting Provider Last Dose Status Informant  amLODipine (NORVASC) 10 MG tablet [id number] Yes Take 1 tablet (10 mg total) by mouth daily. Copland, Gay Filler, MD Taking Active   aspirin EC 81 MG EC tablet [id number] Yes Take  1 tablet (81 mg total) by mouth daily. Rod Can, MD Taking Active Self, Spouse/Significant Other           Med Note Antony Contras, [place]   Tue Feb 13, 2021  9:18 AM)    chlorpheniramine (CHLOR-TRIMETON) 4 MG tablet [id number] Yes Take 1 tablet (4 mg total) by mouth daily.  Patient taking differently: Take 4 mg by mouth daily as needed for allergies.   Copland, Gay Filler, MD Taking Active   cholecalciferol (VITAMIN D) 1000 UNITS tablet [id number] Yes Take 1,000 Units by mouth daily. [provider] Taking Active Self, Spouse/Significant Other  clonazePAM (KLONOPIN) 0.5 MG tablet [id number] Yes Take 0.5-1 tablets (0.25-0.5 mg total) by mouth 2 (two) times daily as needed for anxiety. Copland, Gay Filler, MD Taking Active   clopidogrel (PLAVIX) 75 MG tablet [id number] Yes TAKE 1 TABLET BY MOUTH DAILY. Pixie Casino, MD Taking Active Self, Spouse/Significant Other  denosumab (PROLIA) 60 MG/ML SOSY injection [id number] Yes Inject 60 mg into the skin every 6 (six) months. [provider] Taking Active   ezetimibe (ZETIA) 10 MG tablet [id number] Yes TAKE 1 TABLET BY MOUTH ONCE A DAY Hilty, Nadean Corwin, MD Taking Active Self, Spouse/Significant Other  famotidine (PEPCID) 20 MG tablet [id number] Yes Take 1 tablet (20 mg total) by mouth daily. Copland, Gay Filler, MD Taking Active   fenofibrate (TRICOR) 145 MG tablet [id number] Yes Take 1 tablet by mouth daily. Pixie Casino, MD Taking Active   FLUoxetine (PROZAC) 20 MG capsule [id number] Yes Take 1 capsule (20 mg total) by mouth daily. Copland, Gay Filler, MD Taking Active   fluticasone (FLONASE) 50 MCG/ACT nasal spray [id number] Yes Place 1 spray into both nostrils daily as needed for allergies or rhinitis. Copland, Gay Filler, MD Taking Active Self, Spouse/Significant Other  icosapent Ethyl (VASCEPA) 1 g capsule [id number] Yes TAKE 2 CAPSULES BY MOUTH TWICE DAILY  Patient taking differently: Take 2 g by mouth daily.   Pixie Casino, MD Taking  Active Self, Spouse/Significant Other  isosorbide mononitrate (IMDUR) 30 MG 24 hr tablet [id number] Yes Take 1 tablet (30 mg total) by mouth daily. Pixie Casino, MD Taking Active   Multiple Vitamin (MULTIVITAMIN WITH MINERALS) TABS tablet [id number] Yes Take 1 tablet by mouth daily. [provider] Taking Active Self, Spouse/Significant Other  nitroGLYCERIN (NITROSTAT) 0.4 MG SL tablet [id number]  Place 1 tablet under the tongue every 5 minutes as needed for chest pain. Copland, Gay Filler, MD  Active            Med Note Antony Contras, [place]   Tue Apr 02, 2022 10:01 PM)    RABEprazole (ACIPHEX) 20 MG tablet [id number] Yes Take 1 tablet (20 mg total) by mouth daily 30 minutes prior to food Copland, Gay Filler, MD Taking Active Self, Spouse/Significant Other    Discontinued 02/27/20 2121 (Side effect (s))   simvastatin (ZOCOR) 40 MG tablet [id number] Yes Take 1 tablet (40 mg total) by mouth daily. Copland, Gay Filler, MD Taking Active Self, Spouse/Significant Other  telmisartan (MICARDIS) 40 MG  tablet [id number] Yes Take 1 tablet (40 mg total) by mouth daily. Please keep scheduled appointment Pixie Casino, MD Taking Active             Patient Active Problem List   Diagnosis Date Noted   Primary osteoarthritis of right knee 08/30/2021   Cervical radiculopathy 08/16/2021   Shortness of breath 01/30/2021   Heartburn 01/30/2021   Chronic rhinitis 01/30/2021   Upper airway cough syndrome 11/09/2020   DOE (dyspnea on exertion) 11/09/2020   AKI (acute kidney injury) ([place]) 10/30/2020   Obstructive sleep apnea treated with continuous positive airway pressure (CPAP) 05/07/2018   Chest pain 01/31/2018   Morbid obesity ([place]) 11/03/2017   Coronary artery disease involving native coronary artery of native heart with unstable angina pectoris ([place]) 11/03/2017   Insomnia 11/03/2017   Inadequate sleep hygiene 11/03/2017   Anxiety 07/21/2017   Dizziness 10/02/2016   Right patella fracture 08/29/2016    Acquired foot deformity, left 07/18/2016   Osteopenia 11/10/2015   HNP (herniated nucleus pulposus), lumbar 10/12/2014   Spinal stenosis at L4-L5 level 10/12/2014   Iron deficiency anemia 07/29/2014   DM (diabetes mellitus) with complications ([place]) 26/94/8546   Environmental and seasonal allergies 04/28/2014   Spinal stenosis, lumbar region, with neurogenic claudication 01/06/2013   Obesity, morbid, BMI 40.0-49.9 ([place]) 10/20/2012   Chest pain with moderate risk of acute coronary syndrome 10/02/2012   CAD S/P percutaneous coronary angioplasty    Hyperlipidemia with target LDL less than 70    Essential hypertension 04/16/2012     Medication Assistance:  Lipids medications can be obtained with [employer]. New approval today for $2500 - 04/17/2022 thru 04/17/2023 (still has coverage from 2023 to 04/16/2022)    Assessment / Plan: Mixed hyperlipidemia -  Updated [employer] submitted and approved for 2024. Notified [employer] of information by fax.  Continue current anti-lipid treatment: simvastatin 40mg  daily, Vascepa 1 gram - 2 daily, fenofibrate 145mg  daily and ezetimibe 10mg  daily.  Continue to follow up with Dr Debara Pickett  Osteopenia with high fracture risk - started Prolia January 2023. Last Prolia injection was 10/31/2021 - next due 05/03/2022.  Will send request to Prolia team for estimate of benefits.  Checked to see if post menopausal osteoporosis [place] is open but no current funding available for osteoporosis. Will continue to monitor.   Medication Management:  Reviewed and updated medication list Discussed adherence and reviewed recent refill records.   Requested needed refills for fenofibrate and rabapreazole  Health Maintenance: Medication management:  Discussed getting annual eye exam - patient reports she has an appt 04/09/2022  Follow Up:  Telephone follow up appointment with care management team member scheduled for:  4 to 5  months   [name], PharmD Clinical Pharmacist [place] [phone]

## 2022-04-03 NOTE — Telephone Encounter (Signed)
Patient is due Prolia injection 05/03/2022.  Will forward request to Prolia Team to verify benfits / estimation of benefits.

## 2022-04-05 ENCOUNTER — Other Ambulatory Visit (HOSPITAL_COMMUNITY): Payer: Self-pay

## 2022-04-05 NOTE — Telephone Encounter (Signed)
Prolia VOB initiated via [email or web address]  Last Prolia inj 10/31/21 Next Prolia inj due 05/04/22      

## 2022-04-06 ENCOUNTER — Other Ambulatory Visit (HOSPITAL_COMMUNITY): Payer: Self-pay

## 2022-04-09 LAB — HM DIABETES EYE EXAM

## 2022-04-09 NOTE — Telephone Encounter (Addendum)
Pt ready for scheduling on or after 05/03/22  Out-of-pocket cost due at time of visit: $276 ([employer]) - $200 (Pharmacy)  Primary: [employer] Prolia co-insurance: 20% (approximately $276) Admin fee co-insurance: 0%  Secondary:  Prolia co-insurance:  Admin fee co-insurance:   Deductible:   Prior Auth:  PA# Valid:   ** This summary of benefits is an estimation of the patient's out-of-pocket cost. Exact cost may vary based on individual plan coverage.

## 2022-04-15 ENCOUNTER — Other Ambulatory Visit (HOSPITAL_COMMUNITY): Payer: Self-pay

## 2022-04-15 MED ORDER — DENOSUMAB 60 MG/ML ~~LOC~~ SOSY
60.0000 mg | PREFILLED_SYRINGE | SUBCUTANEOUS | 0 refills | Status: DC
Start: 2022-04-15 — End: 2023-10-14
  Filled 2022-04-15: qty 180, fill #0
  Filled 2022-04-19: qty 1, 180d supply, fill #0
  Filled 2022-10-09: qty 1, 180d supply, fill #1

## 2022-04-15 NOTE — Telephone Encounter (Signed)
Pt scheduled for 05/03/22 and medication sent to [facility] specialty pharmacy to be sent here prior to appointment.

## 2022-04-15 NOTE — Telephone Encounter (Signed)
Patient called back, she would like for it to be sent to the pharmacy and then up to the office to get it administered. Please advise.

## 2022-04-15 NOTE — Telephone Encounter (Signed)
Left message on machine to call back to schedule prolia and let us know if she like to get in office or pharmacy.  Will need to know send back to me if pt would like to get at pharmacy.

## 2022-04-15 NOTE — Addendum Note (Signed)
Addended by: Kem Boroughs D on: 04/15/2022 03:03 PM   Modules accepted: Orders

## 2022-04-16 NOTE — Progress Notes (Addendum)
[place] at [facility] [address] [place], [place] [address] [phone]  Date:  04/17/2022   Name:  [name]   DOB:  February 28, 1951   MRN:  [id number]  PCP:  Darreld Mclean, MD    Chief Complaint: No chief complaint on file.   History of Present Illness:  [name] is a 72 y.o. very pleasant female patient who presents with the following:  Patient seen today with concern of urinary changes and pain around her middle Most recent visit with myself was in November  History of hypertension, CAD, diabetes, sleep apnea, obesity, osteopenia with elevated fracture risk on fosamax Status post PCI 2010 and 2014 Last summer she was admitted with chest pain and had a cath, which was negative She had been on Fosamax-stopped by pulmonology, Dr. Melvyn Novas in case it was worsening her reflux and shortness of breath She now uses Prolia for her osteopenia  We have had some concerns about her memory as well as shakiness and full body tremors At her last visit she had stopped driving due to concerns about her mental fitness  She followed up with her neurologist last month, MRI brain has looked okay TREMOR, NECK PAIN, APHASIA, STUTTERING, MEMORY LOSS - likely related to anxiety disorder and stress reaction - MRI brain unremarkable - continue to gradually increase activity - reviewed nutrition, fitness, sleep and relaxation techniques - follow up with PCP re: anxiety; will refer to psychology follow up; continue prozac and clonazepam      Orders Placed This Encounter  Procedures   Ambulatory referral to Psychology     Also seen by cardiology earlier this month regarding her bradycardia and history of heart disease-beta-blocker was stopped due to bradycardia and presyncope a couple of months ago CAD.  LHC August 2022 showed patent proximal LAD stent and nonobstructive CAD.  Patient denies chest pain, tightness or pressure. No shortness of  breath or DOE.  Continue amlodipine, aspirin, Plavix, Zetia, simvastatin, isosorbide, as needed SL NTG. Bradycardia/presyncope.  Heart rate today 68. Patient denies further episodes of unsteadiness or presyncope. Low-dose beta-blocker was stopped 2 months ago. Will not restart beta-blocker at this time.  Hypertension.  BP today 128/50. Patient denies headaches or dizziness. Continue amlodipine and telmesartan.  Hyperlipidemia.  LDL November 2023 112. Patient reports she has worked closely with Dr. Debara Pickett for years to get her lipids under control. Continue simvastatin, Zetia, Tricor, and Vascepa.    She has noted some pain around her waistline- posterior aspect on both sides- that will come and go She first noted it 4-5 days ago Seems about the same on both sides Will come and go in a few seconds generally Does not radiate down her legs She just got concerned this could indicate a kidney problem  Yesterday am she thought her urine seemed darker than normal but ok today   Notes she has changed her diet some and has lost some weight  Wt Readings from Last 3 Encounters:  04/17/22 189 lb 12.8 oz (86.1 kg)  03/27/22 190 lb (86.2 kg)  02/25/22 194 lb (88 kg)     Patient Active Problem List   Diagnosis Date Noted   Primary osteoarthritis of right knee 08/30/2021   Cervical radiculopathy 08/16/2021   Shortness of breath 01/30/2021   Heartburn 01/30/2021   Chronic rhinitis 01/30/2021   Upper airway cough syndrome 11/09/2020   DOE (dyspnea on exertion) 11/09/2020   AKI (  acute kidney injury) (H[facility]) 10/30/2020   Obstructive sleep apnea treated with continuous positive airway pressure (CPAP) 05/07/2018   Chest pain 01/31/2018   Morbid obesity (H[facility]) 11/03/2017   Coronary artery disease involving native coronary artery of native heart with unstable angina pectoris (H[facility]) 11/03/2017   Insomnia 11/03/2017   Inadequate sleep hygiene 11/03/2017   Anxiety 07/21/2017   Dizziness 10/02/2016   Right  patella fracture 08/29/2016   Acquired foot deformity, left 07/18/2016   Osteopenia 11/10/2015   HNP (herniated nucleus pulposus), lumbar 10/12/2014   Spinal stenosis at L4-L5 level 10/12/2014   Iron deficiency anemia 07/29/2014   DM (diabetes mellitus) with complications (HCC) 07/29/2014   Environmental and seasonal allergies 04/28/2014   Spinal stenosis, lumbar region, with neurogenic claudication 01/06/2013   Obesity, morbid, BMI 40.0-49.9 (HCC) 10/20/2012   Chest pain with moderate risk of acute coronary syndrome 10/02/2012   CAD S/P percutaneous coronary angioplasty    Hyperlipidemia with target LDL less than 70    Essential hypertension 04/16/2012    Past Medical History:  Diagnosis Date   Anemia    Arthritis    Bronchitis    hx of    CAD (coronary artery disease)    a. s/p DES to LAD in 2010 with patent stent by cath in 2014 b. low-risk NST in 11/2016   Cataracts, bilateral    Diabetes mellitus without complication (HCC)    Family history of adverse reaction to anesthesia    pts mother had difficulty awakening    GERD (gastroesophageal reflux disease)    Hyperlipidemia LDL goal < 70    Hypertension    Lumbar back pain    Numbness    left leg and foot    Plantar fascia rupture    Left Foot   Sleep apnea    uses CPAP     Past Surgical History:  Procedure Laterality Date   ABDOMINAL HYSTERECTOMY     BACK SURGERY     BREAST CYST ASPIRATION  1995   CAROTID STENT  2009   pt denies    CORONARY ANGIOPLASTY WITH STENT PLACEMENT  2010and 10-02-2012   Stent DES, Xience to prox. LAD   DOPPLER ECHOCARDIOGRAPHY  08/01/2009   EF=>55%,LV normal   LEFT HEART CATH AND CORONARY ANGIOGRAPHY N/A 12/10/2019   Procedure: LEFT HEART CATH AND CORONARY ANGIOGRAPHY;  Surgeon: Lyn Records, MD;  Location: [facility] INVASIVE CV LAB;  Service: Cardiovascular;  Laterality: N/A;   LEFT HEART CATH AND CORONARY ANGIOGRAPHY N/A 10/30/2020   Procedure: LEFT HEART CATH AND CORONARY ANGIOGRAPHY;   Surgeon: Runell Gess, MD;  Location: [facility] INVASIVE CV LAB;  Service: Cardiovascular;  Laterality: N/A;   LEFT HEART CATHETERIZATION WITH CORONARY ANGIOGRAM N/A 10/02/2012   Procedure: LEFT HEART CATHETERIZATION WITH CORONARY ANGIOGRAM;  Surgeon: Runell Gess, MD;  Location: [facility] CATH LAB;  Service: Cardiovascular;  Laterality: N/A;   lower arterial duplex  06/20/10   abi's normal,rgt 0.98,lft 1.06;bilateral PVRs normal   LUMBAR LAMINECTOMY/DECOMPRESSION MICRODISCECTOMY Left 01/06/2013   Procedure: MICRO LUMBAR DECOMPRESSION L4-5 AND L5-S1;  Surgeon: Javier Docker, MD;  Location: [facility] ORS;  Service: Orthopedics;  Laterality: Left;   LUMBAR LAMINECTOMY/DECOMPRESSION MICRODISCECTOMY Left 10/12/2014   Procedure: REVISION MICRO LUMBAR/DECOMPRESSION L4-5 LEFT ;  Surgeon: Jene Every, MD;  Location: [facility] ORS;  Service: Orthopedics;  Laterality: Left;   NM MYOCAR PERF WALL MOTION  09/22/2008   lexiscan-EF 83%; glogal LV systolic fx is norm. ,evidence of mild ischemia basal anterior,midanterior and apical lateral  region(s).    ORIF PATELLA Right 08/29/2016   Procedure: OPEN REDUCTION INTERNAL (ORIF) FIXATION RIGHT PATELLA;  Surgeon: Samson Frederic, MD;  Location: [facility] ORS;  Service: Orthopedics;  Laterality: Right;  Adductor Block   TUBAL LIGATION     TYMPANOPLASTY Bilateral    UVULOPALATOPHARYNGOPLASTY     pt denies     Social History   Tobacco Use   Smoking status: Never   Smokeless tobacco: Never  Vaping Use   Vaping Use: Never used  Substance Use Topics   Alcohol use: Yes    Comment: occasional, 1 a month wine   Drug use: No    Family History  Problem Relation Age of Onset   Coronary artery disease Mother    Rheum arthritis Mother    Dementia Mother    Heart attack Father    Hypertension Father    Hyperlipidemia Father    Other Father        MVA   Hypertension Brother    Cancer Brother    Heart disease Brother    Cancer Paternal Grandmother        stomach   Diabetes  Paternal Grandfather    Breast cancer Neg Hx     Allergies  Allergen Reactions   Crestor [Rosuvastatin] Other (See Comments)    myalgia   Niacin And Related Other (See Comments)    Whelps and skin flushed, mouth tingling    Medication list has been reviewed and updated.  Current Outpatient Medications on File Prior to Visit  Medication Sig Dispense Refill   amLODipine (NORVASC) 10 MG tablet Take 1 tablet (10 mg total) by mouth daily. 90 tablet 1   aspirin EC 81 MG EC tablet Take 1 tablet (81 mg total) by mouth daily. 30 tablet 1   chlorpheniramine (CHLOR-TRIMETON) 4 MG tablet Take 1 tablet (4 mg total) by mouth daily. (Patient taking differently: Take 4 mg by mouth daily as needed for allergies.) 14 tablet 3   cholecalciferol (VITAMIN D) 1000 UNITS tablet Take 1,000 Units by mouth daily.     clonazePAM (KLONOPIN) 0.5 MG tablet Take 0.5-1 tablets (0.25-0.5 mg total) by mouth 2 (two) times daily as needed for anxiety. 30 tablet 1   clopidogrel (PLAVIX) 75 MG tablet TAKE 1 TABLET BY MOUTH DAILY. 90 tablet 3   denosumab (PROLIA) 60 MG/ML SOSY injection Inject 60 mg into the skin every 6 (six) months. Will get at office 05/03/22 [facility] 180 mL 0   ezetimibe (ZETIA) 10 MG tablet TAKE 1 TABLET BY MOUTH ONCE A DAY 90 tablet 3   famotidine (PEPCID) 20 MG tablet Take 1 tablet (20 mg total) by mouth daily. 90 tablet 3   fenofibrate (TRICOR) 145 MG tablet Take 1 tablet by mouth daily. 90 tablet 3   FLUoxetine (PROZAC) 20 MG capsule Take 1 capsule (20 mg total) by mouth daily. 90 capsule 0   fluticasone (FLONASE) 50 MCG/ACT nasal spray Place 1 spray into both nostrils daily as needed for allergies or rhinitis. 16 g 11   icosapent Ethyl (VASCEPA) 1 g capsule TAKE 2 CAPSULES BY MOUTH TWICE DAILY (Patient taking differently: Take 2 g by mouth daily.) 360 capsule 3   isosorbide mononitrate (IMDUR) 30 MG 24 hr tablet Take 1 tablet (30 mg total) by mouth daily. 90 tablet 2   Multiple Vitamin  (MULTIVITAMIN WITH MINERALS) TABS tablet Take 1 tablet by mouth daily.     nitroGLYCERIN (NITROSTAT) 0.4 MG SL tablet Place 1 tablet under the  tongue every 5 minutes as needed for chest pain. 25 tablet 2   RABEprazole (ACIPHEX) 20 MG tablet Take 1 tablet (20 mg total) by mouth daily 30 minutes prior to food 90 tablet 3   simvastatin (ZOCOR) 40 MG tablet Take 1 tablet (40 mg total) by mouth daily. 90 tablet 3   telmisartan (MICARDIS) 40 MG tablet Take 1 tablet (40 mg total) by mouth daily. Please keep scheduled appointment 90 tablet 3   [DISCONTINUED] rosuvastatin (CRESTOR) 20 MG tablet Take 1 tablet (20 mg total) by mouth daily. 30 tablet 3   No current facility-administered medications on file prior to visit.    Review of Systems:  As per HPI- otherwise negative.  Physical Examination: Vitals:   04/17/22 0954  BP: 138/73  Pulse: 71  SpO2: 98%   Vitals:   04/17/22 0954  Weight: 189 lb 12.8 oz (86.1 kg)  Height: 5' 1.5" (1.562 m)   Body mass index is 35.28 kg/m. Ideal Body Weight: Weight in (lb) to have BMI = 25: 134.2  GEN: no acute distress.  Obese, looks well  HEENT: Atraumatic, Normocephalic.  Ears and Nose: No external deformity. CV: RRR, No M/G/R. No JVD. No thrill. No extra heart sounds. PULM: CTA B, no wheezes, crackles, rhonchi. No retractions. No resp. distress. No accessory muscle use. ABD: S, NT, ND, +BS. No rebound. No HSM. EXTR: No c/c/e PSYCH: Normally interactive. Conversant.  Pt notes an area of tenderness around her posterior waisline, bilateral paralumbar muscles  No redness or swelling of this area Normal thoracolumbar flexion and extension.  Normal bilateral lower extremity strength except for right hamstrings due to "bad knee".  Negative straight leg raise  Results for orders placed or performed in visit on 04/17/22  Urine Culture   Specimen: Urine  Result Value Ref Range   MICRO NUMBER: [id number]    SPECIMEN QUALITY: Adequate    Sample Source NOT  GIVEN    STATUS: FINAL    Result: No Growth   POCT urinalysis dipstick  Result Value Ref Range   Color, UA yellow yellow   Clarity, UA clear clear   Glucose, UA negative negative mg/dL   Bilirubin, UA negative negative   Ketones, POC UA negative negative mg/dL   Spec Grav, UA 1.010 1.010 - 1.025   Blood, UA negative negative   pH, UA 6.0 5.0 - 8.0   Protein Ur, POC negative negative mg/dL   Urobilinogen, UA 0.2 0.2 or 1.0 E.U./dL   Nitrite, UA Negative Negative   Leukocytes, UA Negative Negative    Assessment and Plan: Mid back pain  Dark urine - Plan: Urine Culture, POCT urinalysis dipstick  Patient seen today with concern of mid to lower back pain.  On exam it is most likely due to musculoskeletal pain.  I offered x-rays but she declines at this time.  We noted her urinalysis is quite normal today, culture is pending.  I offered to do blood work also to check on her kidney function but she declines right now.  We discussed conservative treatment for her back pain.  She will let me know if not feeling better over the next few days, sooner if worse  Signed Lamar Blinks, MD  Received urine culture, 1/26.  Message to patient

## 2022-04-16 NOTE — Patient Instructions (Incomplete)
It was good to see you again today.  Recommend getting the latest COVID booster if not done already For your back, continue tylenol as needed Some gentle stretching or walking can help, heat can be helpful If you are not improving over the next several days let me know and I can get x-rays or otherwise looks further  I will check a urine culture for you as well

## 2022-04-17 ENCOUNTER — Other Ambulatory Visit (HOSPITAL_COMMUNITY): Payer: Self-pay

## 2022-04-17 ENCOUNTER — Ambulatory Visit (INDEPENDENT_AMBULATORY_CARE_PROVIDER_SITE_OTHER): Payer: PPO | Admitting: Family Medicine

## 2022-04-17 ENCOUNTER — Encounter: Payer: Self-pay | Admitting: Family Medicine

## 2022-04-17 VITALS — BP 138/73 | HR 71 | Ht 61.5 in | Wt 189.8 lb

## 2022-04-17 DIAGNOSIS — M549 Dorsalgia, unspecified: Secondary | ICD-10-CM

## 2022-04-17 DIAGNOSIS — R82998 Other abnormal findings in urine: Secondary | ICD-10-CM

## 2022-04-17 LAB — POCT URINALYSIS DIP (MANUAL ENTRY)
Bilirubin, UA: NEGATIVE
Blood, UA: NEGATIVE
Glucose, UA: NEGATIVE mg/dL
Ketones, POC UA: NEGATIVE mg/dL
Leukocytes, UA: NEGATIVE
Nitrite, UA: NEGATIVE
Protein Ur, POC: NEGATIVE mg/dL
Spec Grav, UA: 1.01 (ref 1.010–1.025)
Urobilinogen, UA: 0.2 E.U./dL
pH, UA: 6 (ref 5.0–8.0)

## 2022-04-18 LAB — URINE CULTURE
MICRO NUMBER:: 14466878
Result:: NO GROWTH
SPECIMEN QUALITY:: ADEQUATE

## 2022-04-19 ENCOUNTER — Other Ambulatory Visit (HOSPITAL_COMMUNITY): Payer: Self-pay

## 2022-04-19 ENCOUNTER — Other Ambulatory Visit: Payer: Self-pay

## 2022-04-19 ENCOUNTER — Encounter: Payer: Self-pay | Admitting: Family Medicine

## 2022-04-23 ENCOUNTER — Ambulatory Visit (INDEPENDENT_AMBULATORY_CARE_PROVIDER_SITE_OTHER): Payer: PPO | Admitting: Psychology

## 2022-04-23 DIAGNOSIS — F411 Generalized anxiety disorder: Secondary | ICD-10-CM | POA: Diagnosis not present

## 2022-04-23 NOTE — Progress Notes (Signed)
[facility] Counselor Initial Adult Exam  Name: [name] Date: 04/23/2022 MRN: [id number] DOB: 1950-08-28 PCP: Darreld Mclean, MD  Time spent: 65 minutes  Guardian/Payee:  self  Paperwork requested: No   Reason for Visit /Presenting Problem: The patient was referred because the patient was having some issues with anxiety and depression.  The patient reports that she has had some guilt that she has not been able to let go of in 10 years.  Mental Status Exam: Appearance:   Casual     Behavior:  Appropriate  Motor:  Restlestness  Speech/Language:   Clear and Coherent  Affect:  Blunt  Mood:  anxious  Thought process:  normal  Thought content:    WNL  Sensory/Perceptual disturbances:    WNL  Orientation:  oriented to person, place, time/date, and situation  Attention:  Good  Concentration:  Good  Memory:  WNL  Fund of knowledge:   Good  Insight:    Fair  Judgment:   Good  Impulse Control:  Good    Reported Symptoms:  Patient reports that she developed a tremor in her hands and that was her first symptom of anxiety.    Risk Assessment: Danger to Self:  No Self-injurious Behavior: No Danger to Others: No Duty to Warn:no Physical Aggression / Violence:No  Access to Firearms a concern: No  Gang Involvement:No  Patient / guardian was educated about steps to take if suicide or homicide risk level increases between visits: n/a While future psychiatric events cannot be accurately predicted, the patient does not currently require acute inpatient psychiatric care and does not currently meet [facility] involuntary commitment criteria.  Substance Abuse History: Current substance abuse: No     Past Psychiatric History:   No previous psychological problems have been observed Outpatient Providers:N/A History of Psych Hospitalization: No  Psychological Testing: n/A  Abuse History:  Victim of: No., None reported Report needed: No. Victim of  Neglect:No. Perpetrator of n/a Witness / Exposure to Domestic Violence: No   Protective Services Involvement: No  Witness to [employer] Violence:  No   Family History:  Family History  Problem Relation Age of Onset   Coronary artery disease Mother    Rheum arthritis Mother    Dementia Mother    Heart attack Father    Hypertension Father    Hyperlipidemia Father    Other Father        MVA   Hypertension Brother    Cancer Brother    Heart disease Brother    Cancer Paternal Grandmother        stomach   Diabetes Paternal Grandfather    Breast cancer Neg Hx     Living situation: the patient lives with their spouse  Sexual Orientation: Straight  Relationship Status: married  Name of spouse / other:[name] If a parent, number of children / ages:two grown children and patient has several grandchildren and great grandchildren  Support Systems: spouse  Museum/gallery curator Stress:  No   Income/Employment/Disability: Actor: No   Educational History: Education: Scientist, product/process development: Unknown  Any cultural differences that may affect / interfere with treatment:  not applicable   Recreation/Hobbies: music  Stressors: Health problems    Strengths: Supportive Relationships  Barriers:  Patient has health issues   Legal History: Pending legal issue / charges: The patient has no significant history of legal issues. History of legal issue / charges: n/a  Medical History/Surgical History: reviewed  Past Medical History:  Diagnosis Date   Anemia    Arthritis    Bronchitis    hx of    CAD (coronary artery disease)    a. s/p DES to LAD in 2010 with patent stent by cath in 2014 b. low-risk NST in 11/2016   Cataracts, bilateral    Diabetes mellitus without complication ([place])    Family history of adverse reaction to anesthesia    pts mother had difficulty awakening    GERD (gastroesophageal reflux disease)     Hyperlipidemia LDL goal < 70    Hypertension    Lumbar back pain    Numbness    left leg and foot    Plantar fascia rupture    Left Foot   Sleep apnea    uses CPAP     Past Surgical History:  Procedure Laterality Date   ABDOMINAL HYSTERECTOMY     BACK SURGERY     BREAST CYST ASPIRATION  1995   CAROTID STENT  2009   pt denies    CORONARY ANGIOPLASTY WITH STENT PLACEMENT  2010and 10-02-2012   Stent DES, Xience to prox. LAD   DOPPLER ECHOCARDIOGRAPHY  08/01/2009   EF=>55%,LV normal   LEFT HEART CATH AND CORONARY ANGIOGRAPHY N/A 12/10/2019   Procedure: LEFT HEART CATH AND CORONARY ANGIOGRAPHY;  Surgeon: Belva Crome, MD;  Location: [place] [facility];  Service: Cardiovascular;  Laterality: N/A;   LEFT HEART CATH AND CORONARY ANGIOGRAPHY N/A 10/30/2020   Procedure: LEFT HEART CATH AND CORONARY ANGIOGRAPHY;  Surgeon: Lorretta Harp, MD;  Location: [place] [facility];  Service: Cardiovascular;  Laterality: N/A;   LEFT HEART CATHETERIZATION WITH CORONARY ANGIOGRAM N/A 10/02/2012   Procedure: LEFT HEART CATHETERIZATION WITH CORONARY ANGIOGRAM;  Surgeon: Lorretta Harp, MD;  Location: [facility];  Service: Cardiovascular;  Laterality: N/A;   lower arterial duplex  06/20/10   abi's normal,rgt 0.98,lft 1.06;bilateral PVRs normal   LUMBAR LAMINECTOMY/DECOMPRESSION MICRODISCECTOMY Left 01/06/2013   Procedure: MICRO LUMBAR DECOMPRESSION L4-5 AND L5-S1;  Surgeon: Johnn Hai, MD;  Location: [facility];  Service: Orthopedics;  Laterality: Left;   LUMBAR LAMINECTOMY/DECOMPRESSION MICRODISCECTOMY Left 10/12/2014   Procedure: REVISION MICRO LUMBAR/DECOMPRESSION L4-5 LEFT ;  Surgeon: Susa Day, MD;  Location: [facility];  Service: Orthopedics;  Laterality: Left;   NM MYOCAR PERF WALL MOTION  09/22/2008   lexiscan-EF 83%; glogal LV systolic fx is norm. ,evidence of mild ischemia basal anterior,midanterior and apical lateral region(s).    ORIF PATELLA Right 08/29/2016   Procedure: OPEN REDUCTION  INTERNAL (ORIF) FIXATION RIGHT PATELLA;  Surgeon: Rod Can, MD;  Location: [facility];  Service: Orthopedics;  Laterality: Right;  Adductor Block   TUBAL LIGATION     TYMPANOPLASTY Bilateral    UVULOPALATOPHARYNGOPLASTY     pt denies     Medications: Current Outpatient Medications  Medication Sig Dispense Refill   amLODipine (NORVASC) 10 MG tablet Take 1 tablet (10 mg total) by mouth daily. 90 tablet 1   aspirin EC 81 MG EC tablet Take 1 tablet (81 mg total) by mouth daily. 30 tablet 1   chlorpheniramine (CHLOR-TRIMETON) 4 MG tablet Take 1 tablet (4 mg total) by mouth daily. (Patient taking differently: Take 4 mg by mouth daily as needed for allergies.) 14 tablet 3   cholecalciferol (VITAMIN D) 1000 UNITS tablet Take 1,000 Units by mouth daily.     clonazePAM (KLONOPIN) 0.5 MG tablet Take 0.5-1 tablets (0.25-0.5 mg total) by mouth 2 (two) times daily as  needed for anxiety. 30 tablet 1   clopidogrel (PLAVIX) 75 MG tablet TAKE 1 TABLET BY MOUTH DAILY. 90 tablet 3   denosumab (PROLIA) 60 MG/ML SOSY injection Inject 60 mg into the skin every 6 (six) months. Will get at office 05/03/22 [facility] 180 mL 0   ezetimibe (ZETIA) 10 MG tablet TAKE 1 TABLET BY MOUTH ONCE A DAY 90 tablet 3   famotidine (PEPCID) 20 MG tablet Take 1 tablet (20 mg total) by mouth daily. 90 tablet 3   fenofibrate (TRICOR) 145 MG tablet Take 1 tablet by mouth daily. 90 tablet 3   FLUoxetine (PROZAC) 20 MG capsule Take 1 capsule (20 mg total) by mouth daily. 90 capsule 0   fluticasone (FLONASE) 50 MCG/ACT nasal spray Place 1 spray into both nostrils daily as needed for allergies or rhinitis. 16 g 11   icosapent Ethyl (VASCEPA) 1 g capsule TAKE 2 CAPSULES BY MOUTH TWICE DAILY (Patient taking differently: Take 2 g by mouth daily.) 360 capsule 3   isosorbide mononitrate (IMDUR) 30 MG 24 hr tablet Take 1 tablet (30 mg total) by mouth daily. 90 tablet 2   Multiple Vitamin (MULTIVITAMIN WITH MINERALS) TABS tablet Take 1  tablet by mouth daily.     nitroGLYCERIN (NITROSTAT) 0.4 MG SL tablet Place 1 tablet under the tongue every 5 minutes as needed for chest pain. 25 tablet 2   RABEprazole (ACIPHEX) 20 MG tablet Take 1 tablet (20 mg total) by mouth daily 30 minutes prior to food 90 tablet 3   simvastatin (ZOCOR) 40 MG tablet Take 1 tablet (40 mg total) by mouth daily. 90 tablet 3   telmisartan (MICARDIS) 40 MG tablet Take 1 tablet (40 mg total) by mouth daily. Please keep scheduled appointment 90 tablet 3   No current facility-administered medications for this visit.    Allergies  Allergen Reactions   Crestor [Rosuvastatin] Other (See Comments)    myalgia   Niacin And Related Other (See Comments)    [place] and skin flushed, mouth tingling    Diagnoses:  Generalized anxiety disorder  Plan of Care: Client Abilities/Strengths  Intelligent, insightful, motivated  Client Treatment Preferences  Outpatient Individual therapy every other week  Client Statement of Needs  " I need some help with my anxiety" Treatment Level  Outpatient Individual therapy  Symptoms  Frustration and anxiety related to providing oversight and caretaking to an aging, ailing, and dependent  parent.: No Description Entered (Status: improved). Hypervigilance (e.g., feeling constantly on edge,  experiencing concentration difficulties, having trouble falling or staying asleep, exhibiting a general  state of irritability).: No Description Entered (Status: improved). Motor tension (e.g., restlessness,  tiredness, shakiness, muscle tension).: No Description Entered (Status: improved).  Problems Addressed  Anxiety, Phase Of Life Problems, Anxiety  Goals 1. Learn and implement coping skills that result in a reduction of anxiety  and worry, and improved daily functioning. Objective Learn and implement calming skills to reduce overall anxiety and manage anxiety symptoms. Target Date: 01/30/2025Frequency: weekly Progress: 0 Modality:  individual Related Interventions 1. Teach the client calming/relaxation skills (e.g., applied relaxation, progressive muscle  relaxation, cue controlled relaxation; mindful breathing; biofeedback) and how to discriminate  better between relaxation and tension; teach the client how to apply these skills to his/her daily  life (e.g., New Directions in Progressive Muscle Relaxation by Marcelyn Ditty, and  Hazlett-Stevens; Treating Generalized Anxiety Disorder by Rygh and Ida Rogue). Objective Identify, challenge, and replace biased, fearful self-talk with positive, realistic, and empowering selftalk. Target Date:  04/24/2023  Frequency: weekly Progress: 0 Modality: individual Related Interventions 1. Explore the client's schema and self-talk that mediate his/her fear response; assist him/her in  challenging the biases; replace the distorted messages with reality-based alternatives and  positive, realistic self-talk that will increase his/her self-confidence in coping with irrational  fears (see Cognitive Therapy of Anxiety Disorders by Alison Stalling). Objective Learn and implement problem-solving strategies for realistically addressing worries. Target Date: 04/24/2023 Frequency: weekly Progress: 0 Modality: individual 2. Resolve conflicted feelings and adapt to the new life circumstances. Objective Apply problem-solving skills to current circumstances. Target Date: 01/30/2025Frequency: weekly Progress: 0 Modality: individual Related Interventions 1. Teach the client problem-resolution skills (e.g., defining the problem clearly, brainstorming  multiple solutions, listing the pros and cons of each solution, seeking input from others,  selecting and implementing a plan of action, evaluating outcome, and readjusting plan as  necessary).  2. Stabilize anxiety level while increasing ability to function on a daily  basis. Diagnosis 300.02 (Generalized anxiety disorder) - Medications   1. Klonopin Conditions For Discharge Achievement of treatment goals and objectives    Kaysie Michelini G Johnda Billiot, LCSW

## 2022-04-24 ENCOUNTER — Other Ambulatory Visit (HOSPITAL_COMMUNITY): Payer: Self-pay

## 2022-04-25 ENCOUNTER — Other Ambulatory Visit (HOSPITAL_COMMUNITY): Payer: Self-pay

## 2022-04-25 NOTE — Telephone Encounter (Addendum)
I had received this note in another phone message from rx prior auth team 04/03/22.  Patient was scheduled for 05/03/22 but I changed to 05/07/22.      Nona Dell, CPhT  Pharmacy Technician Pharmacy   Telephone Encounter Addendum   Encounter Date: 04/03/2022    Pt ready for scheduling on or after 05/03/22   Out-of-pocket cost due at time of visit: $276 ([place]) - $200 (Pharmacy)   Primary: [employer] Prolia co-insurance: 20% (approximately $276) Admin fee co-insurance: 0%   Secondary:  Prolia co-insurance:  Admin fee co-insurance:    Deductible:    Prior Auth:  PA# Valid:    ** This summary of benefits is an estimation of the patient's out-of-pocket cost. Exact cost may vary based on individual plan coverage.

## 2022-04-25 NOTE — Telephone Encounter (Signed)
Pt ready for scheduling on or after 05/04/22  Out-of-pocket cost due at time of visit: $327  Primary: [employer] Adv PPO Prolia co-insurance: 20% (approximately $302) Admin fee co-insurance: 20% (approximately $25)  Deductible: does not apply  Prior Auth: NOT required PA# Valid:   Secondary: n/a Prolia co-insurance:  Admin fee co-insurance:   Deductible:   Prior Auth:  PA# Valid:   ** This summary of benefits is an estimation of the patient's out-of-pocket cost. Exact cost may vary based on individual plan coverage.

## 2022-04-30 ENCOUNTER — Ambulatory Visit: Payer: PPO | Admitting: Psychology

## 2022-04-30 DIAGNOSIS — F411 Generalized anxiety disorder: Secondary | ICD-10-CM

## 2022-05-01 ENCOUNTER — Encounter: Payer: Self-pay | Admitting: *Deleted

## 2022-05-01 NOTE — Progress Notes (Signed)
[facility] Counselor/Therapist Progress Note  Patient ID: [name], MRN: [id number],    Date: 04/30/2022  Time Spent: 60 minutes  Treatment Type: Individual Therapy  Reported Symptoms: anxiety  Mental Status Exam: Appearance:  Casual     Behavior: Appropriate  Motor: Normal  Speech/Language:  Clear and Coherent  Affect: Appropriate  Mood: anxious  Thought process: normal  Thought content:   WNL  Sensory/Perceptual disturbances:   WNL  Orientation: oriented to person, place, time/date, and situation  Attention: Good  Concentration: Good  Memory: WNL  Fund of knowledge:  Good  Insight:   Fair  Judgment:  Good  Impulse Control: Good   Risk Assessment: Danger to Self:  No Self-injurious Behavior: No Danger to Others: No Duty to Warn:no Physical Aggression / Violence:No  Access to Firearms a concern: No  Gang Involvement:No   Subjective: The patient attended an individual therapy session in the office today.  The patient reports that she is continuing to have problems with anxiety around her mobility and feeling more dependent on her husband.  We talked about what caused her anxiety from the beginning and it seems that the fall that she had was the impetus for her anxiety.  We talked about the possibility of needing to do EMDR to help her with the trauma from that fall.  The patient reports that she is doing workarounds in regard to getting off of [employer] and also her husband is driving her many places.  She also talked today about her parents and the death of her father.  He was hit by a car in front of their house and thrown in the air and died pretty quickly after this happened.  She reports that it was Father's Day weekend several years ago.  Not long after her father passed away her mother also passed away.  The patient has a tendency to take on things and is very much a caregiver and may need some help learning how to have better boundaries around this so  that she can take care of herself.  She does report that she likes to listen to music and we talked about her learning how to manage her anxiety better with meditation and mindfulness and ways to calm herself.  Interventions: Cognitive Behavioral Therapy, Assertiveness/Communication, Mindfulness Meditation, and Eye Movement Desensitization and Reprocessing (EMDR)  Diagnosis:Generalized anxiety disorder  Plan:  Plan of Care: Client Abilities/Strengths  Intelligent, insightful, motivated  Client Treatment Preferences  Outpatient Individual therapy every other week  Client Statement of Needs  " I need some help with my anxiety" Treatment Level  Outpatient Individual therapy  Symptoms  Frustration and anxiety related to providing oversight and caretaking to an aging, ailing, and dependent  parent.: No Description Entered (Status: improved). Hypervigilance (e.g., feeling constantly on edge,  experiencing concentration difficulties, having trouble falling or staying asleep, exhibiting a general  state of irritability).: No Description Entered (Status: improved). Motor tension (e.g., restlessness,  tiredness, shakiness, muscle tension).: No Description Entered (Status: improved).  Problems Addressed  Anxiety, Phase Of Life Problems, Anxiety  Goals 1. Learn and implement coping skills that result in a reduction of anxiety  and worry, and improved daily functioning. Objective Learn and implement calming skills to reduce overall anxiety and manage anxiety symptoms. Target Date: 01/30/2025Frequency: weekly Progress: 0 Modality: individual Related Interventions 1. Teach the client calming/relaxation skills (e.g., applied relaxation, progressive muscle  relaxation, cue controlled relaxation; mindful breathing; biofeedback) and how to discriminate  better between relaxation  and tension; teach the client how to apply these skills to his/her daily  life (e.g., New Directions in Progressive Muscle  Relaxation by Casper Harrison, and  Hazlett-Stevens; Treating Generalized Anxiety Disorder by Rygh and Amparo Bristol). Objective Identify, challenge, and replace biased, fearful self-talk with positive, realistic, and empowering selftalk. Target Date: 04/24/2023  Frequency: weekly Progress: 0 Modality: individual Related Interventions 1. Explore the client's schema and self-talk that mediate his/her fear response; assist him/her in  challenging the biases; replace the distorted messages with reality-based alternatives and  positive, realistic self-talk that will increase his/her self-confidence in coping with irrational  fears (see Cognitive Therapy of Anxiety Disorders by Alison Stalling). Objective Learn and implement problem-solving strategies for realistically addressing worries. Target Date: 04/24/2023 Frequency: weekly Progress: 0 Modality: individual 2. Resolve conflicted feelings and adapt to the new life circumstances. Objective Apply problem-solving skills to current circumstances. Target Date: 01/30/2025Frequency: weekly Progress: 0 Modality: individual Related Interventions 1. Teach the client problem-resolution skills (e.g., defining the problem clearly, brainstorming  multiple solutions, listing the pros and cons of each solution, seeking input from others,  selecting and implementing a plan of action, evaluating outcome, and readjusting plan as  necessary).  2. Stabilize anxiety level while increasing ability to function on a daily  basis. Diagnosis 300.02 (Generalized anxiety disorder) - Medications  1. Klonopin Conditions For Discharge Achievement of treatment goals and objectives  Antanisha Mohs G Tessy Pawelski, LCSW                  Neena Beecham G Kijana Cromie, LCSW

## 2022-05-02 ENCOUNTER — Other Ambulatory Visit (HOSPITAL_COMMUNITY): Payer: Self-pay

## 2022-05-03 ENCOUNTER — Ambulatory Visit: Payer: PPO

## 2022-05-07 ENCOUNTER — Telehealth: Payer: Self-pay | Admitting: *Deleted

## 2022-05-07 ENCOUNTER — Ambulatory Visit (INDEPENDENT_AMBULATORY_CARE_PROVIDER_SITE_OTHER): Payer: PPO | Admitting: *Deleted

## 2022-05-07 ENCOUNTER — Ambulatory Visit: Payer: PPO | Admitting: Psychology

## 2022-05-07 ENCOUNTER — Other Ambulatory Visit (HOSPITAL_BASED_OUTPATIENT_CLINIC_OR_DEPARTMENT_OTHER): Payer: Self-pay

## 2022-05-07 ENCOUNTER — Other Ambulatory Visit (HOSPITAL_COMMUNITY): Payer: Self-pay

## 2022-05-07 DIAGNOSIS — F411 Generalized anxiety disorder: Secondary | ICD-10-CM

## 2022-05-07 DIAGNOSIS — M8580 Other specified disorders of bone density and structure, unspecified site: Secondary | ICD-10-CM

## 2022-05-07 MED ORDER — DENOSUMAB 60 MG/ML ~~LOC~~ SOSY
60.0000 mg | PREFILLED_SYRINGE | Freq: Once | SUBCUTANEOUS | Status: AC
  Administered 2022-05-07: 60 mg via SUBCUTANEOUS

## 2022-05-07 NOTE — Progress Notes (Signed)
Pt here for Prolia injection per   Injection given right subq, and pt tolerated injection well.

## 2022-05-07 NOTE — Telephone Encounter (Signed)
Prolia given 05/07/22

## 2022-05-08 NOTE — Progress Notes (Signed)
[place] Counselor/Therapist Progress Note  Patient ID: NEJLA TROJAN, MRN: [id number],    Date: 05/07/2022  Time Spent: 60 minutes  Treatment Type: Individual Therapy  Reported Symptoms: anxiety  Mental Status Exam: Appearance:  Casual     Behavior: Appropriate  Motor: Normal  Speech/Language:  Clear and Coherent  Affect: Appropriate  Mood: anxious  Thought process: normal  Thought content:   WNL  Sensory/Perceptual disturbances:   WNL  Orientation: oriented to person, place, time/date, and situation  Attention: Good  Concentration: Good  Memory: WNL  Fund of knowledge:  Good  Insight:   Fair  Judgment:  Good  Impulse Control: Good   Risk Assessment: Danger to Self:  No Self-injurious Behavior: No Danger to Others: No Duty to Warn:no Physical Aggression / Violence:No  Access to Firearms a concern: No  Gang Involvement:No   Subjective: The patient attended an individual therapy session in the office today.  The patient reports that she has been listening to music and she feels like she has been doing better with her anxiety.  She states that she did go off of a curb the other day and her husband was surprised about this.  The patient reports that she is doing more activities and feels like she is doing better than she was.  We talked about continuing to do the EMDR and she is going to use the buzzers for the dual brain stimulation.  We talked about doing EMDR around 2 of her falls and we will continue to evaluate if there is other things that we need to do EMDR around. Interventions: Cognitive Behavioral Therapy, Assertiveness/Communication, Mindfulness Meditation, and Eye Movement Desensitization and Reprocessing (EMDR)  Diagnosis:Generalized anxiety disorder  Plan:  Plan of Care: Client Abilities/Strengths  Intelligent, insightful, motivated  Client Treatment Preferences  Outpatient Individual therapy every other week  Client Statement of Needs  "  I need some help with my anxiety" Treatment Level  Outpatient Individual therapy  Symptoms  Frustration and anxiety related to providing oversight and caretaking to an aging, ailing, and dependent  parent.: No Description Entered (Status: improved). Hypervigilance (e.g., feeling constantly on edge,  experiencing concentration difficulties, having trouble falling or staying asleep, exhibiting a general  state of irritability).: No Description Entered (Status: improved). Motor tension (e.g., restlessness,  tiredness, shakiness, muscle tension).: No Description Entered (Status: improved).  Problems Addressed  Anxiety, Phase Of Life Problems, Anxiety  Goals 1. Learn and implement coping skills that result in a reduction of anxiety  and worry, and improved daily functioning. Objective Learn and implement calming skills to reduce overall anxiety and manage anxiety symptoms. Target Date: 01/30/2025Frequency: weekly Progress: 10 Modality: individual Related Interventions 1. Teach the client calming/relaxation skills (e.g., applied relaxation, progressive muscle  relaxation, cue controlled relaxation; mindful breathing; biofeedback) and how to discriminate  better between relaxation and tension; teach the client how to apply these skills to his/her daily  life (e.g., New Directions in Progressive Muscle Relaxation by Casper Harrison, and  Hazlett-Stevens; Treating Generalized Anxiety Disorder by Rygh and Amparo Bristol). Objective Identify, challenge, and replace biased, fearful self-talk with positive, realistic, and empowering selftalk. Target Date: 04/24/2023  Frequency: weekly Progress: 0 Modality: individual Related Interventions 1. Explore the client's schema and self-talk that mediate his/her fear response; assist him/her in  challenging the biases; replace the distorted messages with reality-based alternatives and  positive, realistic self-talk that will increase his/her self-confidence  in coping with irrational  fears (see Cognitive Therapy of Anxiety Disorders by  Carlis Abbott and Lometa). Objective Learn and implement problem-solving strategies for realistically addressing worries. Target Date: 04/24/2023 Frequency: weekly Progress: 0 Modality: individual 2. Resolve conflicted feelings and adapt to the new life circumstances. Objective Apply problem-solving skills to current circumstances. Target Date: 01/30/2025Frequency: weekly Progress: 0 Modality: individual Related Interventions 1. Teach the client problem-resolution skills (e.g., defining the problem clearly, brainstorming  multiple solutions, listing the pros and cons of each solution, seeking input from others,  selecting and implementing a plan of action, evaluating outcome, and readjusting plan as  necessary).  2. Stabilize anxiety level while increasing ability to function on a daily  basis. Diagnosis 300.02 (Generalized anxiety disorder) - Medications  1. Klonopin Conditions For Discharge Achievement of treatment goals and objectives  Jevaughn Degollado G Vina Byrd, LCSW                                 Joyice Magda G Tonnie Stillman, LCSW

## 2022-05-09 NOTE — Telephone Encounter (Signed)
Last Prolia inj 05/07/22 Next Prolia inj due 11/06/22

## 2022-05-14 ENCOUNTER — Other Ambulatory Visit (HOSPITAL_COMMUNITY): Payer: Self-pay

## 2022-05-14 ENCOUNTER — Ambulatory Visit (INDEPENDENT_AMBULATORY_CARE_PROVIDER_SITE_OTHER): Payer: PPO | Admitting: Psychology

## 2022-05-14 DIAGNOSIS — F411 Generalized anxiety disorder: Secondary | ICD-10-CM | POA: Diagnosis not present

## 2022-05-14 NOTE — Progress Notes (Signed)
[place] Counselor/Therapist Progress Note  Patient ID: FRANCESE SHANK, MRN: [id number],    Date: 05/14/2022  Time Spent: 60 minutes  Treatment Type: Individual Therapy  Reported Symptoms: anxiety  Mental Status Exam: Appearance:  Casual     Behavior: Appropriate  Motor: Normal  Speech/Language:  Clear and Coherent  Affect: Appropriate  Mood: pleasant  Thought process: normal  Thought content:   WNL  Sensory/Perceptual disturbances:   WNL  Orientation: oriented to person, place, time/date, and situation  Attention: Good  Concentration: Good  Memory: WNL  Fund of knowledge:  Good  Insight:   Fair  Judgment:  Good  Impulse Control: Good   Risk Assessment: Danger to Self:  No Self-injurious Behavior: No Danger to Others: No Duty to Warn:no Physical Aggression / Violence:No  Access to Firearms a concern: No  Gang Involvement:No   Subjective: The patient attended an individual therapy session in the office today.  The patient reports that she is doing very well.  She presents as pleasant and cooperative and she states that she has been practicing going up and down curbs.  The patient says that she has been feeling much better and she reports that she feels like it is because she is doing therapy.  The patient states that she is doing some things with her sister next week and wanted to cancel the appointment and I told her that I thought that would be fine.  The patient says that she has been doing more around the house and has been cleaning out some closets and she feels really good about this.  The patient is making excellent progress in therapy and we will continue to meet and may still eventually do EMDR.   Interventions: Cognitive Behavioral Therapy, Assertiveness/Communication, Mindfulness Meditation, and Eye Movement Desensitization and Reprocessing (EMDR)  Diagnosis:Generalized anxiety disorder  Plan:  Plan of Care: Client Abilities/Strengths   Intelligent, insightful, motivated  Client Treatment Preferences  Outpatient Individual therapy every other week  Client Statement of Needs  " I need some help with my anxiety" Treatment Level  Outpatient Individual therapy  Symptoms  Frustration and anxiety related to providing oversight and caretaking to an aging, ailing, and dependent  parent.: No Description Entered (Status: improved). Hypervigilance (e.g., feeling constantly on edge,  experiencing concentration difficulties, having trouble falling or staying asleep, exhibiting a general  state of irritability).: No Description Entered (Status: improved). Motor tension (e.g., restlessness,  tiredness, shakiness, muscle tension).: No Description Entered (Status: improved).  Problems Addressed  Anxiety, Phase Of Life Problems, Anxiety  Goals 1. Learn and implement coping skills that result in a reduction of anxiety  and worry, and improved daily functioning. Objective Learn and implement calming skills to reduce overall anxiety and manage anxiety symptoms. Target Date: 01/30/2025Frequency: weekly Progress: 10 Modality: individual Related Interventions 1. Teach the client calming/relaxation skills (e.g., applied relaxation, progressive muscle  relaxation, cue controlled relaxation; mindful breathing; biofeedback) and how to discriminate  better between relaxation and tension; teach the client how to apply these skills to his/her daily  life (e.g., New Directions in Progressive Muscle Relaxation by Casper Harrison, and  Hazlett-Stevens; Treating Generalized Anxiety Disorder by Rygh and Amparo Bristol). Objective Identify, challenge, and replace biased, fearful self-talk with positive, realistic, and empowering selftalk. Target Date: 04/24/2023  Frequency: weekly Progress: 10 Modality: individual Related Interventions 1. Explore the client's schema and self-talk that mediate his/her fear response; assist him/her in  challenging the  biases; replace the distorted  messages with reality-based alternatives and  positive, realistic self-talk that will increase his/her self-confidence in coping with irrational  fears (see Cognitive Therapy of Anxiety Disorders by Alison Stalling). Objective Learn and implement problem-solving strategies for realistically addressing worries. Target Date: 04/24/2023 Frequency: weekly Progress: 0 Modality: individual 2. Resolve conflicted feelings and adapt to the new life circumstances. Objective Apply problem-solving skills to current circumstances. Target Date: 01/30/2025Frequency: weekly Progress: 0 Modality: individual Related Interventions 1. Teach the client problem-resolution skills (e.g., defining the problem clearly, brainstorming  multiple solutions, listing the pros and cons of each solution, seeking input from others,  selecting and implementing a plan of action, evaluating outcome, and readjusting plan as  necessary).  2. Stabilize anxiety level while increasing ability to function on a daily  basis. Diagnosis 300.02 (Generalized anxiety disorder) - Medications  1. Klonopin 2. Prozac Conditions For Discharge Achievement of treatment goals and objectives  Talayeh Bruinsma G Waverley Krempasky, LCSW

## 2022-05-16 ENCOUNTER — Ambulatory Visit
Admission: RE | Admit: 2022-05-16 | Discharge: 2022-05-16 | Disposition: A | Discharge status: Home / Self Care | Payer: PPO | Source: Ambulatory Visit | Attending: Family Medicine | Admitting: Family Medicine

## 2022-05-16 DIAGNOSIS — Z1231 Encounter for screening mammogram for malignant neoplasm of breast: Secondary | ICD-10-CM | POA: Diagnosis not present

## 2022-05-21 ENCOUNTER — Ambulatory Visit: Payer: PPO | Admitting: Psychology

## 2022-05-21 NOTE — Telephone Encounter (Signed)
Forwarding to Rx Prior Auth Team 

## 2022-05-28 ENCOUNTER — Ambulatory Visit (INDEPENDENT_AMBULATORY_CARE_PROVIDER_SITE_OTHER): Payer: PPO | Admitting: Psychology

## 2022-05-28 DIAGNOSIS — F411 Generalized anxiety disorder: Secondary | ICD-10-CM

## 2022-05-28 NOTE — Progress Notes (Signed)
[place] Counselor/Therapist Progress Note  Patient ID: [name], MRN: [id number],    Date: 05/28/2022  Time Spent: 60 minutes  Treatment Type: Individual Therapy  Reported Symptoms: anxiety  Mental Status Exam: Appearance:  Casual     Behavior: Appropriate  Motor: Normal  Speech/Language:  Clear and Coherent  Affect: Appropriate  Mood: pleasant  Thought process: normal  Thought content:   WNL  Sensory/Perceptual disturbances:   WNL  Orientation: oriented to person, place, time/date, and situation  Attention: Good  Concentration: Good  Memory: WNL  Fund of knowledge:  Good  Insight:   Fair  Judgment:  Good  Impulse Control: Good   Risk Assessment: Danger to Self:  No Self-injurious Behavior: No Danger to Others: No Duty to Warn:no Physical Aggression / Violence:No  Access to Firearms a concern: No  Gang Involvement:No   Subjective: The patient attended an individual therapy session in the office today.  The patient was smiling and pleasant today.  She reports that she feels like she is her old self.  She states that last week she had gone for a mammogram and she decided while they were that she was going to go and see if she could do a tour of the [facility].  The patient went and called and apparently went for a tour for about an hour and she reports that she had a wonderful experience.  One of the people that she toured with basically said to her that all of the new building was her legacy.  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  She reports that she saw several people that she knew and that it went very well.  The patient reports that she feels like she is doing well and she is no longer staying in her room and being sad.  We decided today that she could graduate and that she could come back if necessary.    Interventions: Cognitive Behavioral Therapy,  Assertiveness/Communication, Mindfulness Meditation, and Eye Movement Desensitization and Reprocessing (EMDR)  Diagnosis:Generalized anxiety disorder  Plan:  Plan of Care: Client Abilities/Strengths  Intelligent, insightful, motivated  Client Treatment Preferences  Outpatient Individual therapy every other week  Client Statement of Needs  " I need some help with my anxiety" Treatment Level  Outpatient Individual therapy  Symptoms  Frustration and anxiety related to providing oversight and caretaking to an aging, ailing, and dependent  parent.: No Description Entered (Status: improved). Hypervigilance (e.g., feeling constantly on edge,  experiencing concentration difficulties, having trouble falling or staying asleep, exhibiting a general  state of irritability).: No Description Entered (Status: improved). Motor tension (e.g., restlessness,  tiredness, shakiness, muscle tension).: No Description Entered (Status: improved).  Problems Addressed  Anxiety, Phase Of Life Problems, Anxiety  Goals 1. Learn and implement coping skills that result in a reduction of anxiety  and worry, and improved daily functioning. Objective Learn and implement calming skills to reduce overall anxiety and manage anxiety symptoms. Target Date: 01/30/2025Frequency: weekly Progress: 10 Modality: individual Related Interventions 1. Teach the client calming/relaxation skills (e.g., applied relaxation, progressive muscle  relaxation, cue controlled relaxation; mindful breathing; biofeedback) and how to discriminate  better between relaxation and tension; teach the client how to apply these skills to his/her daily  life (e.g., New Directions in Progressive Muscle Relaxation by Casper Harrison, and  Hazlett-Stevens; Treating Generalized Anxiety Disorder by Rygh and Amparo Bristol). Objective Identify, challenge, and replace  biased, fearful self-talk with positive, realistic, and empowering selftalk. Target Date:  04/24/2023  Frequency: weekly Progress: 10 Modality: individual Related Interventions 1. Explore the client's schema and self-talk that mediate his/her fear response; assist him/her in  challenging the biases; replace the distorted messages with reality-based alternatives and  positive, realistic self-talk that will increase his/her self-confidence in coping with irrational  fears (see Cognitive Therapy of Anxiety Disorders by Alison Stalling). Objective Learn and implement problem-solving strategies for realistically addressing worries. Target Date: 04/24/2023 Frequency: weekly Progress: 0 Modality: individual 2. Resolve conflicted feelings and adapt to the new life circumstances. Objective Apply problem-solving skills to current circumstances. Target Date: 01/30/2025Frequency: weekly Progress: 0 Modality: individual Related Interventions 1. Teach the client problem-resolution skills (e.g., defining the problem clearly, brainstorming  multiple solutions, listing the pros and cons of each solution, seeking input from others,  selecting and implementing a plan of action, evaluating outcome, and readjusting plan as  necessary).  2. Stabilize anxiety level while increasing ability to function on a daily  basis. Diagnosis 300.02 (Generalized anxiety disorder) - Medications  1. Klonopin 2. Prozac Conditions For Discharge Achievement of treatment goals and objectives  Haileigh Pitz G Sonu Kruckenberg, LCSW

## 2022-06-03 ENCOUNTER — Other Ambulatory Visit (HOSPITAL_BASED_OUTPATIENT_CLINIC_OR_DEPARTMENT_OTHER): Payer: Self-pay

## 2022-06-06 ENCOUNTER — Other Ambulatory Visit: Payer: Self-pay

## 2022-06-07 ENCOUNTER — Other Ambulatory Visit (HOSPITAL_COMMUNITY): Payer: Self-pay

## 2022-06-07 ENCOUNTER — Ambulatory Visit: Payer: PPO | Admitting: Psychology

## 2022-06-07 ENCOUNTER — Other Ambulatory Visit: Payer: Self-pay

## 2022-06-11 ENCOUNTER — Ambulatory Visit: Payer: PPO | Admitting: Psychology

## 2022-06-29 NOTE — Progress Notes (Unsigned)
[facility] [address], [address] [place], [place] [phone] [phone]  Date:  07/01/2022   Name:  [name]   DOB:  12/23/1950   MRN:  [id number]  PCP:  Pearline Cablesopland, Arilla Hice C, MD    Chief Complaint: No chief complaint on file.   History of Present Illness:  [name] is a 72 y.o. very pleasant female patient who presents with the following:  She is seen today for periodic follow-up History of hypertension, CAD, diabetes, sleep apnea, obesity, osteopenia with elevated fracture risk on fosamax Status post PCI 2010 and 2014 Last summer she was admitted with chest pain and had a cath, which was negative She had been on Fosamax-stopped by pulmonology, Dr. Sherene SiresWert in case it was worsening her reflux and shortness of breath She now uses Prolia for her osteopenia  Most recent visit with myself in January at that time she was struggling with some back pain.  She also recently has been worried about her memory- is experiencing episodes of shaking and full body tremors.  She was seen by neurology who noted an unremarkable brain MRI, they thought this was actually a stress reaction/anxiety disorder as a TIA or other neurologic issue Cardiology also stopped her beta-blocker due to bradycardia and presyncope-Per recent cardiology note in January: CAD.  LHC August 2022 showed patent proximal LAD stent and nonobstructive CAD.  Patient denies chest pain, tightness or pressure. No shortness of breath or DOE.  Continue amlodipine, aspirin, Plavix, Zetia, simvastatin, isosorbide, as needed SL NTG. Bradycardia/presyncope.  Heart rate today 68. Patient denies further episodes of unsteadiness or presyncope. Low-dose beta-blocker was stopped 2 months ago. Will not restart beta-blocker at this time.  Hypertension.  BP today 128/50. Patient denies headaches or dizziness. Continue amlodipine and telmesartan.  Hyperlipidemia.  LDL November 2023 112. Patient  reports she has worked closely with Dr. Rennis GoldenHilty for years to get her lipids under control. Continue simvastatin, Zetia, Tricor, and Vascepa  Recommend COVID booster Colon cancer screening appears to be due Foot exam is due Update A1c CMP, lipid completed November  Amlodipine 10 Aspirin 81 Plavix Zetia Tricor Vascepa Imdur Nitro as needed Zocor 40 Telmisartan 40 Prolia Fluoxetine 20 Temazepam as needed Patient Active Problem List   Diagnosis Date Noted   Primary osteoarthritis of right knee 08/30/2021   Cervical radiculopathy 08/16/2021   Shortness of breath 01/30/2021   Heartburn 01/30/2021   Chronic rhinitis 01/30/2021   Upper airway cough syndrome 11/09/2020   DOE (dyspnea on exertion) 11/09/2020   AKI (acute kidney injury) 10/30/2020   Obstructive sleep apnea treated with continuous positive airway pressure (CPAP) 05/07/2018   Chest pain 01/31/2018   Morbid obesity 11/03/2017   Coronary artery disease involving native coronary artery of native heart with unstable angina pectoris 11/03/2017   Insomnia 11/03/2017   Inadequate sleep hygiene 11/03/2017   Anxiety 07/21/2017   Dizziness 10/02/2016   Right patella fracture 08/29/2016   Acquired foot deformity, left 07/18/2016   Osteopenia 11/10/2015   HNP (herniated nucleus pulposus), lumbar 10/12/2014   Spinal stenosis at L4-L5 level 10/12/2014   Iron deficiency anemia 07/29/2014   DM (diabetes mellitus) with complications 07/29/2014   Environmental and seasonal allergies 04/28/2014   Spinal stenosis, lumbar region, with neurogenic claudication 01/06/2013   Obesity, morbid, BMI 40.0-49.9 10/20/2012   Chest pain with moderate risk of acute coronary syndrome 10/02/2012   CAD S/P percutaneous coronary angioplasty  Hyperlipidemia with target LDL less than 70    Essential hypertension 04/16/2012    Past Medical History:  Diagnosis Date   Anemia    Arthritis    Bronchitis    hx of    CAD (coronary artery disease)     a. s/p DES to LAD in 2010 with patent stent by cath in 2014 b. low-risk NST in 11/2016   Cataracts, bilateral    Diabetes mellitus without complication    Family history of adverse reaction to anesthesia    pts mother had difficulty awakening    GERD (gastroesophageal reflux disease)    Hyperlipidemia LDL goal < 70    Hypertension    Lumbar back pain    Numbness    left leg and foot    Plantar fascia rupture    Left Foot   Sleep apnea    uses CPAP     Past Surgical History:  Procedure Laterality Date   ABDOMINAL HYSTERECTOMY     BACK SURGERY     BREAST CYST ASPIRATION  1995   CAROTID STENT  2009   pt denies    CORONARY ANGIOPLASTY WITH STENT PLACEMENT  2010and 10-02-2012   Stent DES, Xience to prox. LAD   DOPPLER ECHOCARDIOGRAPHY  08/01/2009   EF=>55%,LV normal   LEFT HEART CATH AND CORONARY ANGIOGRAPHY N/A 12/10/2019   Procedure: LEFT HEART CATH AND CORONARY ANGIOGRAPHY;  Surgeon: Lyn Records, MD;  Location: [facility];  Service: Cardiovascular;  Laterality: N/A;   LEFT HEART CATH AND CORONARY ANGIOGRAPHY N/A 10/30/2020   Procedure: LEFT HEART CATH AND CORONARY ANGIOGRAPHY;  Surgeon: Runell Gess, MD;  Location: [facility];  Service: Cardiovascular;  Laterality: N/A;   LEFT HEART CATHETERIZATION WITH CORONARY ANGIOGRAM N/A 10/02/2012   Procedure: LEFT HEART CATHETERIZATION WITH CORONARY ANGIOGRAM;  Surgeon: Runell Gess, MD;  Location: [facility] CATH LAB;  Service: Cardiovascular;  Laterality: N/A;   lower arterial duplex  06/20/10   abi's normal,rgt 0.98,lft 1.06;bilateral PVRs normal   LUMBAR LAMINECTOMY/DECOMPRESSION MICRODISCECTOMY Left 01/06/2013   Procedure: MICRO LUMBAR DECOMPRESSION L4-5 AND L5-S1;  Surgeon: Javier Docker, MD;  Location: [facility];  Service: Orthopedics;  Laterality: Left;   LUMBAR LAMINECTOMY/DECOMPRESSION MICRODISCECTOMY Left 10/12/2014   Procedure: REVISION MICRO LUMBAR/DECOMPRESSION L4-5 LEFT ;  Surgeon: Jene Every, MD;   Location: [facility];  Service: Orthopedics;  Laterality: Left;   NM MYOCAR PERF WALL MOTION  09/22/2008   lexiscan-EF 83%; glogal LV systolic fx is norm. ,evidence of mild ischemia basal anterior,midanterior and apical lateral region(s).    ORIF PATELLA Right 08/29/2016   Procedure: OPEN REDUCTION INTERNAL (ORIF) FIXATION RIGHT PATELLA;  Surgeon: Samson Frederic, MD;  Location: [facility];  Service: Orthopedics;  Laterality: Right;  Adductor Block   TUBAL LIGATION     TYMPANOPLASTY Bilateral    UVULOPALATOPHARYNGOPLASTY     pt denies     Social History   Tobacco Use   Smoking status: Never   Smokeless tobacco: Never  Vaping Use   Vaping Use: Never used  Substance Use Topics   Alcohol use: Yes    Comment: occasional, 1 a month wine   Drug use: No    Family History  Problem Relation Age of Onset   Coronary artery disease Mother    Rheum arthritis Mother    Dementia Mother    Heart attack Father    Hypertension Father    Hyperlipidemia Father    Other Father  MVA   Hypertension Brother    Cancer Brother    Heart disease Brother    Cancer Paternal Grandmother        stomach   Diabetes Paternal Grandfather    Breast cancer Neg Hx     Allergies  Allergen Reactions   Crestor [Rosuvastatin] Other (See Comments)    myalgia   Niacin And Related Other (See Comments)    Whelps and skin flushed, mouth tingling    Medication list has been reviewed and updated.  Current Outpatient Medications on File Prior to Visit  Medication Sig Dispense Refill   amLODipine (NORVASC) 10 MG tablet Take 1 tablet (10 mg total) by mouth daily. 90 tablet 1   aspirin EC 81 MG EC tablet Take 1 tablet (81 mg total) by mouth daily. 30 tablet 1   chlorpheniramine (CHLOR-TRIMETON) 4 MG tablet Take 1 tablet (4 mg total) by mouth daily. (Patient taking differently: Take 4 mg by mouth daily as needed for allergies.) 14 tablet 3   cholecalciferol (VITAMIN D) 1000 UNITS tablet Take 1,000 Units by mouth  daily.     clonazePAM (KLONOPIN) 0.5 MG tablet Take 0.5-1 tablets (0.25-0.5 mg total) by mouth 2 (two) times daily as needed for anxiety. 30 tablet 1   clopidogrel (PLAVIX) 75 MG tablet TAKE 1 TABLET BY MOUTH DAILY. 90 tablet 3   denosumab (PROLIA) 60 MG/ML SOSY injection Inject 60 mg into the skin every 6 (six) months. Will get at office 05/03/22 [facility] 180 mL 0   ezetimibe (ZETIA) 10 MG tablet TAKE 1 TABLET BY MOUTH ONCE A DAY 90 tablet 3   famotidine (PEPCID) 20 MG tablet Take 1 tablet (20 mg total) by mouth daily. 90 tablet 3   fenofibrate (TRICOR) 145 MG tablet Take 1 tablet by mouth daily. 90 tablet 3   FLUoxetine (PROZAC) 20 MG capsule Take 1 capsule (20 mg total) by mouth daily. 90 capsule 0   fluticasone (FLONASE) 50 MCG/ACT nasal spray Place 1 spray into both nostrils daily as needed for allergies or rhinitis. 16 g 11   icosapent Ethyl (VASCEPA) 1 g capsule TAKE 2 CAPSULES BY MOUTH TWICE DAILY (Patient taking differently: Take 2 g by mouth daily.) 360 capsule 3   isosorbide mononitrate (IMDUR) 30 MG 24 hr tablet Take 1 tablet (30 mg total) by mouth daily. 90 tablet 2   Multiple Vitamin (MULTIVITAMIN WITH MINERALS) TABS tablet Take 1 tablet by mouth daily.     nitroGLYCERIN (NITROSTAT) 0.4 MG SL tablet Place 1 tablet under the tongue every 5 minutes as needed for chest pain. 25 tablet 2   RABEprazole (ACIPHEX) 20 MG tablet Take 1 tablet (20 mg total) by mouth daily 30 minutes prior to food 90 tablet 3   simvastatin (ZOCOR) 40 MG tablet Take 1 tablet (40 mg total) by mouth daily. 90 tablet 3   telmisartan (MICARDIS) 40 MG tablet Take 1 tablet (40 mg total) by mouth daily. Please keep scheduled appointment 90 tablet 3   [DISCONTINUED] rosuvastatin (CRESTOR) 20 MG tablet Take 1 tablet (20 mg total) by mouth daily. 30 tablet 3   No current facility-administered medications on file prior to visit.    Review of Systems:  As per HPI- otherwise negative.   Physical  Examination: There were no vitals filed for this visit. There were no vitals filed for this visit. There is no height or weight on file to calculate BMI. Ideal Body Weight:    GEN: no acute distress. HEENT: Atraumatic,  Normocephalic.  Ears and Nose: No external deformity. CV: RRR, No M/G/R. No JVD. No thrill. No extra heart sounds. PULM: CTA B, no wheezes, crackles, rhonchi. No retractions. No resp. distress. No accessory muscle use. ABD: S, NT, ND, +BS. No rebound. No HSM. EXTR: No c/c/e PSYCH: Normally interactive. Conversant.  Foot exam  Assessment and Plan: ***  Signed Abbe Amsterdam, MD

## 2022-06-29 NOTE — Patient Instructions (Incomplete)
It was great to see you again today, I will be in touch with your labs.  Assuming all is well we can plan to follow-up in about 6 months  I recommend getting a COVID booster if not done in the last 6 to 9 months  Please call Dr Loreta Ave and schedule your colonoscopy (I believe you are due) Address: [address], [place], [place] [address] Phone: [phone]  Referral also placed to PT at [facility] for you- please give them a call if you don't hear from them in a few days  Address: [address] [place], [place] [address] Phone: [phone]

## 2022-07-01 ENCOUNTER — Ambulatory Visit (INDEPENDENT_AMBULATORY_CARE_PROVIDER_SITE_OTHER): Payer: PPO | Admitting: Family Medicine

## 2022-07-01 ENCOUNTER — Encounter: Payer: Self-pay | Admitting: Family Medicine

## 2022-07-01 VITALS — BP 110/60 | HR 59 | Temp 97.6°F | Resp 18 | Ht 61.5 in | Wt 188.6 lb

## 2022-07-01 DIAGNOSIS — E118 Type 2 diabetes mellitus with unspecified complications: Secondary | ICD-10-CM

## 2022-07-01 DIAGNOSIS — Z1211 Encounter for screening for malignant neoplasm of colon: Secondary | ICD-10-CM | POA: Diagnosis not present

## 2022-07-01 DIAGNOSIS — Z9861 Coronary angioplasty status: Secondary | ICD-10-CM

## 2022-07-01 DIAGNOSIS — I10 Essential (primary) hypertension: Secondary | ICD-10-CM | POA: Diagnosis not present

## 2022-07-01 DIAGNOSIS — M62838 Other muscle spasm: Secondary | ICD-10-CM | POA: Diagnosis not present

## 2022-07-01 DIAGNOSIS — E785 Hyperlipidemia, unspecified: Secondary | ICD-10-CM

## 2022-07-01 DIAGNOSIS — D649 Anemia, unspecified: Secondary | ICD-10-CM

## 2022-07-01 DIAGNOSIS — R079 Chest pain, unspecified: Secondary | ICD-10-CM | POA: Diagnosis not present

## 2022-07-01 DIAGNOSIS — I2510 Atherosclerotic heart disease of native coronary artery without angina pectoris: Secondary | ICD-10-CM

## 2022-07-01 LAB — BASIC METABOLIC PANEL
BUN: 21 mg/dL (ref 6–23)
CO2: 28 mEq/L (ref 19–32)
Calcium: 10 mg/dL (ref 8.4–10.5)
Chloride: 103 mEq/L (ref 96–112)
Creatinine, Ser: 0.95 mg/dL (ref 0.40–1.20)
GFR: 59.78 mL/min — ABNORMAL LOW (ref >60.00)
Glucose, Bld: 131 mg/dL — ABNORMAL HIGH (ref 70–99)
Potassium: 4.4 mEq/L (ref 3.5–5.1)
Sodium: 139 mEq/L (ref 135–145)

## 2022-07-01 LAB — CBC
HCT: 35 % — ABNORMAL LOW (ref 36.0–46.0)
Hemoglobin: 11.2 g/dL — ABNORMAL LOW (ref 12.0–15.0)
MCHC: 31.9 g/dL (ref 30.0–36.0)
MCV: 79.1 fl (ref 78.0–100.0)
Platelets: 411 10*3/uL — ABNORMAL HIGH (ref 150.0–400.0)
RBC: 4.43 Mil/uL (ref 3.87–5.11)
RDW: 15.1 % (ref 11.5–15.5)
WBC: 9.7 10*3/uL (ref 4.0–10.5)

## 2022-07-01 LAB — HEMOGLOBIN A1C: Hgb A1c MFr Bld: 7.4 % — ABNORMAL HIGH (ref 4.6–6.5)

## 2022-07-01 LAB — TROPONIN I (HIGH SENSITIVITY): High Sens Troponin I: 8 ng/L (ref 2–17)

## 2022-07-08 ENCOUNTER — Encounter: Payer: Self-pay | Admitting: *Deleted

## 2022-07-25 ENCOUNTER — Other Ambulatory Visit: Payer: Self-pay | Admitting: Internal Medicine

## 2022-07-25 ENCOUNTER — Other Ambulatory Visit (HOSPITAL_COMMUNITY): Payer: Self-pay

## 2022-07-25 ENCOUNTER — Other Ambulatory Visit: Payer: Self-pay

## 2022-07-25 MED ORDER — EZETIMIBE 10 MG PO TABS
10.0000 mg | ORAL_TABLET | Freq: Every day | ORAL | 3 refills | Status: DC
  Filled 2022-07-25: qty 90, 90d supply, fill #0
  Filled 2022-08-31 – 2022-11-22 (×3): qty 90, 90d supply, fill #1
  Filled 2023-01-01 – 2023-04-11 (×2): qty 90, 90d supply, fill #2

## 2022-08-11 ENCOUNTER — Emergency Department (HOSPITAL_COMMUNITY)
Admission: EM | Admit: 2022-08-11 | Discharge: 2022-08-11 | Disposition: A | Discharge status: Home / Self Care | Payer: PPO | Attending: Emergency Medicine | Admitting: Emergency Medicine

## 2022-08-11 ENCOUNTER — Other Ambulatory Visit: Payer: Self-pay

## 2022-08-11 ENCOUNTER — Emergency Department (HOSPITAL_COMMUNITY): Payer: PPO

## 2022-08-11 DIAGNOSIS — R079 Chest pain, unspecified: Secondary | ICD-10-CM | POA: Diagnosis not present

## 2022-08-11 DIAGNOSIS — F419 Anxiety disorder, unspecified: Secondary | ICD-10-CM | POA: Insufficient documentation

## 2022-08-11 DIAGNOSIS — Z7982 Long term (current) use of aspirin: Secondary | ICD-10-CM | POA: Diagnosis not present

## 2022-08-11 DIAGNOSIS — I959 Hypotension, unspecified: Secondary | ICD-10-CM | POA: Diagnosis not present

## 2022-08-11 DIAGNOSIS — R0789 Other chest pain: Secondary | ICD-10-CM | POA: Diagnosis not present

## 2022-08-11 DIAGNOSIS — I2510 Atherosclerotic heart disease of native coronary artery without angina pectoris: Secondary | ICD-10-CM | POA: Insufficient documentation

## 2022-08-11 DIAGNOSIS — I10 Essential (primary) hypertension: Secondary | ICD-10-CM | POA: Insufficient documentation

## 2022-08-11 DIAGNOSIS — Z8679 Personal history of other diseases of the circulatory system: Secondary | ICD-10-CM

## 2022-08-11 DIAGNOSIS — Z79899 Other long term (current) drug therapy: Secondary | ICD-10-CM | POA: Insufficient documentation

## 2022-08-11 LAB — CBC
HCT: 36.4 % (ref 36.0–46.0)
Hemoglobin: 11.1 g/dL — ABNORMAL LOW (ref 12.0–15.0)
MCH: 24.7 pg — ABNORMAL LOW (ref 26.0–34.0)
MCHC: 30.5 g/dL (ref 30.0–36.0)
MCV: 81.1 fL (ref 80.0–100.0)
Platelets: 412 10*3/uL — ABNORMAL HIGH (ref 150–400)
RBC: 4.49 MIL/uL (ref 3.87–5.11)
RDW: 14.5 % (ref 11.5–15.5)
WBC: 12.8 10*3/uL — ABNORMAL HIGH (ref 4.0–10.5)
nRBC: 0 % (ref 0.0–0.2)

## 2022-08-11 LAB — TROPONIN I (HIGH SENSITIVITY)
Troponin I (High Sensitivity): 7 ng/L (ref <18)
Troponin I (High Sensitivity): 8 ng/L (ref <18)

## 2022-08-11 LAB — BASIC METABOLIC PANEL
Anion gap: 11 (ref 5–15)
BUN: 19 mg/dL (ref 8–23)
CO2: 24 mmol/L (ref 22–32)
Calcium: 9.6 mg/dL (ref 8.9–10.3)
Chloride: 104 mmol/L (ref 98–111)
Creatinine, Ser: 1.13 mg/dL — ABNORMAL HIGH (ref 0.44–1.00)
GFR, Estimated: 52 mL/min — ABNORMAL LOW (ref >60)
Glucose, Bld: 149 mg/dL — ABNORMAL HIGH (ref 70–99)
Potassium: 3.9 mmol/L (ref 3.5–5.1)
Sodium: 139 mmol/L (ref 135–145)

## 2022-08-11 MED ORDER — LORAZEPAM 2 MG/ML IJ SOLN
1.0000 mg | Freq: Once | INTRAMUSCULAR | Status: AC
  Administered 2022-08-11: 1 mg via INTRAVENOUS
  Filled 2022-08-11: qty 1

## 2022-08-11 NOTE — ED Provider Notes (Signed)
Patient is a handoff from [employer], PA-C.  Patient has a HEART score of 5/6. Pending for repeat troponin.  Patient likely benefit from cardiology consultation given heart score even if troponin is negative. Physical Exam  BP 126/78   Pulse 74   Temp 98.7 F (37.1 C) (Oral)   Resp 19   LMP  (LMP Unknown)   SpO2 99%   Physical Exam Vitals and nursing note reviewed.  Constitutional:      General: She is not in acute distress.    Appearance: She is well-developed.  HENT:     Head: Normocephalic and atraumatic.  Eyes:     Conjunctiva/sclera: Conjunctivae normal.  Cardiovascular:     Rate and Rhythm: Normal rate and regular rhythm.     Heart sounds: Normal heart sounds. Heart sounds not distant. No murmur heard. Pulmonary:     Effort: Pulmonary effort is normal. No respiratory distress.     Breath sounds: Normal breath sounds. No decreased breath sounds.  Abdominal:     Palpations: Abdomen is soft.     Tenderness: There is no abdominal tenderness.  Musculoskeletal:        General: No swelling.     Cervical back: Normal range of motion and neck supple.  Skin:    General: Skin is warm and dry.     Capillary Refill: Capillary refill takes less than 2 seconds.  Neurological:     Mental Status: She is alert.  Psychiatric:        Mood and Affect: Mood normal.     Procedures  Procedures  ED Course / MDM    Medical Decision Making Amount and/or Complexity of Data Reviewed Labs: ordered. Radiology: ordered.  Risk Prescription drug management.   Patient is a handoff from [employer], PA-C.  Please see their note for full HPI and physical exam findings.  At the time of handoff, plan is to wait for repeat troponin and likely cardiology consultation given HEART score of 5-6 indicating high risk. Repeat troponin flat at 8. Will consult cardiology for admission vs outpatient management. Was unable to have cardiology return call for consultation in a reasonable time. Patient requesting to  leave AMA due to delay. Informed patient of risk of leaving AMA including worsening of condition up to death. Patient would still prefer to leave AMA and instead call Dr. Blanchie Dessert office and schedule outpatient follow up. No need for any refills on any medications at this time. Although I do not believe that patient is in imminent danger requiring IVC for evaluation, expressed concern given HEART score indicating that admission would be favorable for further evaluation. Patient will plan to leave AMA regardless at this time.       Smitty Knudsen, PA-C 08/11/22 [id number]    Mardene Sayer, MD 08/12/22 [id number]

## 2022-08-11 NOTE — ED Triage Notes (Signed)
Patient BIB EMS from home for left sided chest pain that started suddenly at 11am today. Patient took ASA 567mg  and x3 Nitro at home. No N/V/D. CBG 159. H/O RBB. 116/62, 98%, 18

## 2022-08-11 NOTE — ED Provider Notes (Signed)
[place] EMERGENCY DEPARTMENT AT [facility] Provider Note   CSN: [id number] Arrival date & time: 08/11/22  1246     History  Chief Complaint  Patient presents with   Chest Pain    [name] is a 72 y.o. female with past medical history CAD, hypertension, hyperlipidemia, anemia, GERD who presents to the ED complaining of onset of left-sided chest pain that radiates to her left arm at 11 AM today.  She states that she took 4 baby aspirin and 3 nitroglycerin at home without relief.  She denies associated shortness of breath, cough, congestion, fever, chills, abdominal pain, nausea, vomiting, diarrhea, leg pain or swelling.  She states that the pain is making her feel very anxious and worried.  Last heart cath was in August 2022 and showed a patent proximal LAD stent and nonobstructive CAD.  LAD stent was placed in 2010 following abnormal stress test.  Patient followed by Dr. Rennis Golden, MD with cardiology. Last visit with NP in January 2024.       Home Medications Prior to Admission medications   Medication Sig Start Date End Date Taking? Authorizing Provider  amLODipine (NORVASC) 10 MG tablet Take 1 tablet (10 mg total) by mouth daily. 03/11/22   Copland, [name], MD  aspirin EC 81 MG EC tablet Take 1 tablet (81 mg total) by mouth daily. 08/30/16   Swinteck, Arlys John, MD  chlorpheniramine (CHLOR-TRIMETON) 4 MG tablet Take 1 tablet (4 mg total) by mouth daily. Patient taking differently: Take 4 mg by mouth daily as needed for allergies. 09/21/21   Copland, [name], MD  cholecalciferol (VITAMIN D) 1000 UNITS tablet Take 1,000 Units by mouth daily.    [provider]  clonazePAM (KLONOPIN) 0.5 MG tablet Take 0.5-1 tablets (0.25-0.5 mg total) by mouth 2 (two) times daily as needed for anxiety. 03/12/22   Copland, [name], MD  denosumab (PROLIA) 60 MG/ML SOSY injection Inject 60 mg into the skin every 6 (six) months. Will get at office 05/03/22 [facility] 04/15/22    Copland, [name], MD  ezetimibe (ZETIA) 10 MG tablet Take 1 tablet (10 mg total) by mouth daily. 07/25/22   Hilty, Lisette Abu, MD  famotidine (PEPCID) 20 MG tablet Take 1 tablet (20 mg total) by mouth daily. 03/11/22   Copland, [name], MD  fenofibrate (TRICOR) 145 MG tablet Take 1 tablet by mouth daily. 10/18/21   Hilty, Lisette Abu, MD  FLUoxetine (PROZAC) 20 MG capsule Take 1 capsule (20 mg total) by mouth daily. 03/28/22   Copland, [name], MD  fluticasone (FLONASE) 50 MCG/ACT nasal spray Place 1 spray into both nostrils daily as needed for allergies or rhinitis. 02/09/21   Copland, [name], MD  icosapent Ethyl (VASCEPA) 1 g capsule TAKE 2 CAPSULES BY MOUTH TWICE DAILY Patient taking differently: Take 2 g by mouth daily. 08/10/21   Hilty, Lisette Abu, MD  isosorbide mononitrate (IMDUR) 30 MG 24 hr tablet Take 1 tablet (30 mg total) by mouth daily. 03/12/22   Hilty, Lisette Abu, MD  Multiple Vitamin (MULTIVITAMIN WITH MINERALS) TABS tablet Take 1 tablet by mouth daily.    [provider]  nitroGLYCERIN (NITROSTAT) 0.4 MG SL tablet Place 1 tablet under the tongue every 5 minutes as needed for chest pain. 01/24/22   Copland, [name], MD  RABEprazole (ACIPHEX) 20 MG tablet Take 1 tablet (20 mg total) by mouth daily 30 minutes prior to food 04/03/22   Copland, [name], MD  simvastatin (  ZOCOR) 40 MG tablet Take 1 tablet (40 mg total) by mouth daily. 07/03/21   Copland, [name], MD  telmisartan (MICARDIS) 40 MG tablet Take 1 tablet (40 mg total) by mouth daily. Please keep scheduled appointment 03/12/22 03/12/23  Chrystie Nose, MD  rosuvastatin (CRESTOR) 20 MG tablet Take 1 tablet (20 mg total) by mouth daily. 12/29/19 02/27/20  Ronney Asters, NP      Allergies    Crestor [rosuvastatin] and Niacin and related    Review of Systems   Review of Systems  All other systems reviewed and are negative.   Physical Exam Updated Vital Signs BP (!) 140/74   Pulse (!) 59   Temp 98.9 F (37.2  C)   Resp (!) 22   LMP  (LMP Unknown)   SpO2 97%  Physical Exam Vitals and nursing note reviewed.  Constitutional:      General: She is not in acute distress.    Appearance: Normal appearance. She is not ill-appearing, toxic-appearing or diaphoretic.  HENT:     Head: Normocephalic and atraumatic.     Mouth/Throat:     Mouth: Mucous membranes are moist.  Eyes:     Extraocular Movements: Extraocular movements intact.     Conjunctiva/sclera: Conjunctivae normal.  Neck:     Vascular: No JVD.  Cardiovascular:     Rate and Rhythm: Normal rate and regular rhythm.     Heart sounds: Normal heart sounds. No murmur heard.    No S3 or S4 sounds.  Pulmonary:     Effort: Pulmonary effort is normal. No tachypnea, accessory muscle usage or respiratory distress.     Breath sounds: Normal breath sounds. No stridor. No decreased breath sounds, wheezing, rhonchi or rales.  Abdominal:     General: Abdomen is flat.     Palpations: Abdomen is soft. There is no fluid wave or mass.     Tenderness: There is no abdominal tenderness. There is no guarding or rebound.  Musculoskeletal:        General: Normal range of motion.     Cervical back: Neck supple.     Right lower leg: No tenderness. No edema.     Left lower leg: No tenderness. No edema.  Skin:    General: Skin is warm and dry.     Capillary Refill: Capillary refill takes less than 2 seconds.     Findings: No rash.  Neurological:     General: No focal deficit present.     Mental Status: She is alert. Mental status is at baseline.  Psychiatric:        Mood and Affect: Mood is anxious (very).        Speech: Speech normal.        Behavior: Behavior is cooperative.     ED Results / Procedures / Treatments   Labs (all labs ordered are listed, but only abnormal results are displayed) Labs Reviewed  BASIC METABOLIC PANEL - Abnormal; Notable for the following components:      Result Value   Glucose, Bld 149 (*)    Creatinine, Ser 1.13 (*)     GFR, Estimated 52 (*)    All other components within normal limits  CBC - Abnormal; Notable for the following components:   WBC 12.8 (*)    Hemoglobin 11.1 (*)    MCH 24.7 (*)    Platelets 412 (*)    All other components within normal limits  TROPONIN I (HIGH SENSITIVITY)  TROPONIN I (HIGH  SENSITIVITY)    EKG EKG Interpretation  Date/Time:  Sunday Aug 11 2022 12:51:01 EDT Ventricular Rate:  64 PR Interval:  70 QRS Duration: 158 QT Interval:  429 QTC Calculation: 443 R Axis:   87 Text Interpretation: Sinus rhythm Short PR interval Right bundle branch block no significant change since June 2023 Confirmed by Pricilla Loveless [phone]) on 08/11/2022 12:53:01 PM  Radiology DG Chest Port 1 View  Result Date: 08/11/2022 CLINICAL DATA:  72 year old female with history of chest pain since 11 o'clock this morning. EXAM: PORTABLE CHEST 1 VIEW COMPARISON:  Chest x-ray 10/26/2020. FINDINGS: Lung volumes are normal. No consolidative airspace disease. No pleural effusions. No pneumothorax. No pulmonary nodule or mass noted. Pulmonary vasculature and the cardiomediastinal silhouette are within normal limits. IMPRESSION: No radiographic evidence of acute cardiopulmonary disease. Electronically Signed   By: Trudie Reed M.D.   On: 08/11/2022 13:17    Procedures Procedures    Medications Ordered in ED Medications  LORazepam (ATIVAN) injection 1 mg (1 mg Intravenous Given 08/11/22 1312)    ED Course/ Medical Decision Making/ A&P                             Medical Decision Making Amount and/or Complexity of Data Reviewed Labs: ordered. Decision-making details documented in ED Course. Radiology: ordered. Decision-making details documented in ED Course. ECG/medicine tests: ordered. Decision-making details documented in ED Course.  Risk Prescription drug management.   Medical Decision Making:   VYSHNAVI CIFELLI is a 72 y.o. female who presented to the ED today with chest pain detailed  above.    Patient's presentation is complicated by their history of HTN, HLD, CAD.  Patient placed on continuous vitals and telemetry monitoring while in ED which was reviewed periodically.  Complete initial physical exam performed, notably the patient  was very anxious. RRR. LCTA. Abdomen soft and nontender. Nonfocal neuro exam. No LE edema or tenderness.    Reviewed and confirmed nursing documentation for past medical history, family history, social history.    Initial Assessment:   With the patient's presentation of chest pain, the emergent differential diagnosis of chest pain includes: Acute coronary syndrome, pericarditis, aortic dissection, pulmonary embolism, tension pneumothorax, and esophageal rupture. Other urgent/non-acute considerations include, but are not limited to: chronic angina, aortic stenosis, cardiomyopathy, myocarditis, mitral valve prolapse, pulmonary hypertension, hypertrophic obstructive cardiomyopathy (HOCM), aortic insufficiency, right ventricular hypertrophy, pneumonia, pleuritis, bronchitis, pneumothorax, tumor, gastroesophageal reflux disease (GERD), esophageal spasm, Mallory-Weiss syndrome, peptic ulcer disease, biliary disease, pancreatitis, functional gastrointestinal pain, cervical or thoracic disk disease or arthritis, shoulder arthritis, costochondritis, subacromial bursitis, anxiety or panic attack, herpes zoster, breast disorders, chest wall tumors, thoracic outlet syndrome, mediastinitis.    Initial Plan:  Screening labs including CBC and Metabolic panel to evaluate for infectious or metabolic etiology of disease.  CXR to evaluate for structural/infectious intrathoracic pathology.  EKG and troponin to evaluate for cardiac pathology Ativan for anxiety Objective evaluation as reviewed   Initial Study Results:   Laboratory  All laboratory results reviewed without evidence of clinically relevant pathology.   Exceptions include: Cr 1.13 similar to previous, GFR  52, WBC 12.8, Hgb 11.1   EKG EKG was reviewed independently. ST segments without concerns for elevations.   EKG: unchanged from previous tracings, normal sinus rhythm, RBBB.   Radiology:  All images reviewed independently. Agree with radiology report at this time.   DG Chest Port 1 View  Result Date: 08/11/2022 CLINICAL DATA:  72 year old female with history of chest pain since 11 o'clock this morning. EXAM: PORTABLE CHEST 1 VIEW COMPARISON:  Chest x-ray 10/26/2020. FINDINGS: Lung volumes are normal. No consolidative airspace disease. No pleural effusions. No pneumothorax. No pulmonary nodule or mass noted. Pulmonary vasculature and the cardiomediastinal silhouette are within normal limits. IMPRESSION: No radiographic evidence of acute cardiopulmonary disease. Electronically Signed   By: Trudie Reed M.D.   On: 08/11/2022 13:17    Heart score 5 points Moderate Score (4-6 points)  Risk of MACE of 12-16.6%.    Final Assessment and Plan:   72 year old female presenting to ED for evaluation of chest pain today. Remote history of LAD stent placement in 2010 with patent stent and nonobstructive CAD during last heart cath 2022. Followed by cardiology. On arrival, pt very anxious. Dose of Ativan given. Reporting worsening chest pain. EKG NSR without acute ST-T changes. Aspirin and NTG taken prior to arrival. Slightly hypertensive but maintaining oxygen saturation on room air, afebrile, no acute distress. CXR negative. Minimal leukocytosis. Kidney function at baseline, no significant electrolyte disturbance. First troponin 7. With recent onset of symptoms, will plan to repeat troponin with anticipated admission with heart score 5. Care transitioned to oncoming PA-C, [name], pending repeat troponin and disposition. Pt stable at time of care transition.     Clinical Impression:  1. Chest pain, unspecified type   2. Anxiety   3. History of CAD (coronary artery disease)      Data  Unavailable           Final Clinical Impression(s) / ED Diagnoses Final diagnoses:  Chest pain, unspecified type  Anxiety  History of CAD (coronary artery disease)    Rx / DC Orders ED Discharge Orders     None         Tonette Lederer, PA-C 08/12/22 0911    Pricilla Loveless, MD 08/14/22 [phone]

## 2022-08-11 NOTE — ED Notes (Addendum)
Gave pt a sandwich and water EDPA stated it

## 2022-08-11 NOTE — ED Notes (Signed)
Patient states she is ready to go. MD made aware. Patient ambulatory, A&Ox4 no confusion. Husband at bedside.

## 2022-08-12 ENCOUNTER — Telehealth: Payer: Self-pay | Admitting: Internal Medicine

## 2022-08-12 ENCOUNTER — Other Ambulatory Visit: Payer: Self-pay | Admitting: Family Medicine

## 2022-08-12 ENCOUNTER — Other Ambulatory Visit: Payer: Self-pay | Admitting: Internal Medicine

## 2022-08-12 ENCOUNTER — Other Ambulatory Visit: Payer: Self-pay

## 2022-08-12 ENCOUNTER — Other Ambulatory Visit (HOSPITAL_COMMUNITY): Payer: Self-pay

## 2022-08-12 DIAGNOSIS — R251 Tremor, unspecified: Secondary | ICD-10-CM

## 2022-08-12 MED ORDER — CLONAZEPAM 0.5 MG PO TABS
0.2500 mg | ORAL_TABLET | Freq: Two times a day (BID) | ORAL | 1 refills | Status: AC | PRN
Start: 2022-08-12 — End: ?
  Filled 2022-08-12: qty 30, 15d supply, fill #0
  Filled 2022-10-09: qty 30, 15d supply, fill #1

## 2022-08-12 MED ORDER — CLOPIDOGREL BISULFATE 75 MG PO TABS
75.0000 mg | ORAL_TABLET | Freq: Every day | ORAL | 3 refills | Status: DC
  Filled 2022-08-12: qty 90, 90d supply, fill #0
  Filled 2022-08-31 – 2022-11-04 (×2): qty 90, 90d supply, fill #1
  Filled 2022-11-22 – 2023-02-24 (×2): qty 90, 90d supply, fill #2

## 2022-08-12 NOTE — Telephone Encounter (Signed)
Spoke to patient's husband he stated wife has been having chest pain.EMS took her to [facility] ED yesterday. No chest pain this morning.She was told to see Dr.Hilty this week.Appointment scheduled with Dr.Hilty 5/21 at 10:00 am.Advised if she has any more chest pain go back to ED.

## 2022-08-12 NOTE — Telephone Encounter (Signed)
Pt called in stating she has pressure in her chest (not painful) and her hr has been low. She states she was in the ED yesterday and not sure if Dr. Rennis Golden wants to adjust her medications. Please advise.

## 2022-08-13 ENCOUNTER — Encounter: Payer: Self-pay | Admitting: Internal Medicine

## 2022-08-13 ENCOUNTER — Ambulatory Visit: Payer: PPO | Attending: Internal Medicine | Admitting: Internal Medicine

## 2022-08-13 VITALS — BP 120/72 | HR 58 | Ht 61.5 in | Wt 188.0 lb

## 2022-08-13 DIAGNOSIS — T466X5D Adverse effect of antihyperlipidemic and antiarteriosclerotic drugs, subsequent encounter: Secondary | ICD-10-CM

## 2022-08-13 DIAGNOSIS — F419 Anxiety disorder, unspecified: Secondary | ICD-10-CM | POA: Diagnosis not present

## 2022-08-13 DIAGNOSIS — K219 Gastro-esophageal reflux disease without esophagitis: Secondary | ICD-10-CM | POA: Diagnosis not present

## 2022-08-13 DIAGNOSIS — E785 Hyperlipidemia, unspecified: Secondary | ICD-10-CM | POA: Diagnosis not present

## 2022-08-13 DIAGNOSIS — M791 Myalgia, unspecified site: Secondary | ICD-10-CM

## 2022-08-13 DIAGNOSIS — R079 Chest pain, unspecified: Secondary | ICD-10-CM | POA: Diagnosis not present

## 2022-08-13 DIAGNOSIS — T466X5A Adverse effect of antihyperlipidemic and antiarteriosclerotic drugs, initial encounter: Secondary | ICD-10-CM

## 2022-08-13 NOTE — Patient Instructions (Signed)
Medication Instructions:  NO CHANGES  *If you need a refill on your cardiac medications before your next appointment, please call your pharmacy*    Follow-Up: At [facility], you and your health needs are our priority.  As part of our continuing mission to provide you with exceptional heart care, we have created designated Provider Care Teams.  These Care Teams include your primary Cardiologist (physician) and Advanced Practice Providers (APPs -  Physician Assistants and Nurse Practitioners) who all work together to provide you with the care you need, when you need it.  We recommend signing up for the patient portal called "MyChart".  Sign up information is provided on this After Visit Summary.  MyChart is used to connect with patients for Virtual Visits (Telemedicine).  Patients are able to view lab/test results, encounter notes, upcoming appointments, etc.  Non-urgent messages can be sent to your provider as well.   To learn more about what you can do with MyChart, go to [email or web address].    Your next appointment:    6 months with Dr. Hilty 

## 2022-08-13 NOTE — Progress Notes (Signed)
Date:  08/13/2022   ID:  [name], DOB 21-Jul-1950, MRN [id number]  PCP:  Pearline Cables, MD  Primary Cardiologist:  Rennis Golden  CC:  Follow-up hospitalization   History of Present Illness: [name] is a 72 y.o. female with a history of CAD with DES stent to prox. LAD in 2010 with residual disease in LCX 10-20% and RCA 20% lesion.  She also has HTN, DM, dyslipidemia and obesity.  She presented on 10/01/12 to the ED with c/o chest pressure. Discomfort began at work and associated with flushing symptoms secondary to niacin. No DOE, SOB, nausea or diaphoresis. She then developed chest pressure and nausea and went to Urgent care. She thought the flushing was secondary to stopping ASA. She was given ASA, cooled down and then had chest presssure. Sent to [facility] ER and NTG SL did resolve the pressure. She was very concerned about the pressure because it was the same that she had with her Lexiscan in 2010 that lead to her stent.  Coronary angiogram revealed widely patent proximal LAD stent with otherwise normal coronary arteries and normal LV function.  [name] recently underwent back surgery and is doing fairly well. She still is having some back pain but is adamant about not taking medication. She plans to go back to work on Monday next week. Unfortunately she is still grieving from her brother who passed away last week. He had stage IV pancreatic cancer and lived less than one year.  [name] returns today for follow-up. Overall she denies any chest pain or worsening shortness of breath. Recently she's been having some palpitations, however she attributes that to stress. She recently is been placed on CPAP and seems to be tolerating that well. She's had some problems with memory loss but neurologic workup is been unremarkable. She recently had worsening back problems and has been to see Dr. Shelle Iron. He is considering surgery on her back but would need her to come off of her antiplatelets  medications.  At the pleasure seeing [name] back today in follow-up. She underwent successful back surgery and is doing better. She's managed to lose significant amount of weight. She's remained on Toprol which was started preoperatively. Her surgery was without complication. Recently she's had some dizziness with change in position. This could be related to dietary changes however I did note that her diastolic pressure is low today at 56. She denies any chest pain or worsening shortness of breath.  02/12/2016  [name] returns today for follow-up. She is recently been having some more palpitations. She reports being under significant stress as her daughter and granddaughter are now living with them. She gets some fullness in her chest when she has palpitations. These episodes occur 2-3 times a week over the past month or so. She denies any exertional chest pain or worsening shortness of breath. Weight is Morrie Sheldon down 3 pounds since her last office visit. Blood pressure is well-controlled. She recently started Celebrex and I spoke with her primary care provider about this. She needs to be careful about taking it regularly in the setting of aspirin and Plavix use.  03/07/2016  I saw [name] back today for follow-up of her monitor. She reports she had 2 episodes of palpitations and some elevated blood pressure on the day of Thanksgiving. She was dealing with her sister who had recently had a brain bleed and had to take her to the emergency room. She apparently had some seizures.  She was also trying to cook Thanksgiving meals and felt overwhelmed. She did take some Valium with some benefit however one point said her blood pressure was up to 210/100. Since then she's felt much better. We did not to capture any episodes of arrhythmias, extrasystoles or anything concerning on her monitor. I suspect her symptoms are related to stress and anxiety. Blood pressure is fairly well-controlled  today.  10/02/2016  [name] returns today for follow-up. Unfortunate she recently broke her leg. She says she has a problem with some foot drag and she misstepped. She had have surgery and is still wearing a brace in the right leg. She is improving and has better range of motion. Blood pressure is good today 138/62. She reports recently she's been having some dizziness. Sometimes positional with sitting up at other times not positional. She is afraid of falling. She's been very anxious. In fact she's been taking Valium although she says only a few times a week. She does not think that is related to either the Valium or her pain medicine. She is concerned it could be her blood pressure although has not noted any low blood pressure readings.  07/21/2017  [name] was seen today in follow-up.  She is without any cardiac complaints.  In September she was hospitalized with chest pain symptoms.  She ruled out for MI and underwent stress testing which was low risk.  Subsequently she was noted to have significant anxiety, some degree of agoraphobia, and fear of walking by herself.  She was started on fluoxetine and has been referred to a psychologist.  She is also undergone some cognitive behavioral therapy which has been helpful.  02/04/2018  [name] was seen today in follow-up of recent hospitalization for chest pain.  She was seen by my partner Dr. Sherlie Ban who felt that her pain was pleuritic, positional and reproducible on exam and not likely cardiac.  She ruled out for MI.  According to her husband recently she has been caring more things and doing more exertion and he feels that she may have strained her back.  She does describe pain and tenderness which was reproducible along the right mid thoracic area that seems to wrap around the anterior chest.  The symptoms are still persistent but somewhat better.  05/17/2020  [name] is seen today in follow-up.  She has been followed by Edd Fabian, NP more recently.   She has had some intolerance to her medications, namely her Vascepa which she currently takes 1 g twice daily.  She was switched to rosuvastatin but she had significant myalgias with this.  She went back to simvastatin 40 mg.  She has not had her lipids reassessed.  Her target LDL is less than 70 given coronary disease although fortunately recent cardiac catheterization showed no significant obstructive disease.  She denies any chest pain or worsening shortness of breath and has lost a couple pounds recently according to her home scale  12/06/2020  [name] returns today for follow-up.  Overall she reports she is feeling much better.  She saw [name] over the summer and was complaining of some chest pain.  She underwent a Myoview stress test which was negative for ischemia.  Subsequently she presented the ER with ongoing left sternal chest pain.  She underwent cardiac catheterization which demonstrated a patent LAD stent and no other significant coronary disease.  LV function was normal by echo.  Ultimately she had a follow-up with Dr. Sherene Sires.  He thought that her symptoms might  be related to reflux in fact had recommended she stop Fosamax.  She was changed to Aciphex.  Since then her symptoms have resolved.  I suspect he was right in that diagnosis.  She does have follow-up with him.  I had added fenofibrate for her triglycerides which were well over 500.  She is due for repeat lipids and is fasting today.  06/05/2021  [name] returns today for follow-up.  Overall she seems to be doing well.  Her lipids appear much better.  Total cholesterol 143, triglycerides 124, HDL 37 LDL 84.  She seems to be tolerating the medicines without any issues.  She denies any recurrent chest pain.  Her A1c crept up a little at 7.5% in January but she says she is lost weight and now is below 200 pounds.  She is trying to get down to about 160 pounds.  01/25/2022  [name] is seen today for dizziness and presyncope.  She was  apparently at the beach with her husband and friends and had episodes of presyncope and dizziness.  She was seen by EMS and noted to be tachycardic although EKG strips they brought in today heart rate was in the 60s and appeared to be a sinus rhythm.  She is actually had some issues with bradycardia.  She is concerned because her mother also had bradycardia and required a pacemaker.  She is on low-dose metoprolol.  Heart rate today in the 40s.  She says she feels like she has episodes where she is unsteady and feels like she may pass out.  She is also had significant tremor.  She says this also does run in the family.  She is seen by Dr. Marjory Lies who is her neurologist.  Orthostatic vitals were performed today and were negative.  08/13/2022  [name] returns today for follow-up.  She was seen in the emergency department for her significant chest pain.  They had been at the beach for about a week and then she had symptoms of chest pain.  She says it came on and gradually dissipated after several hours.  They called EMS and they noted some mild EKG changes.  They advised transport to the hospital.  Workup was negative.  She notes that she has been eating fried fish on a daily basis when she was at the beach and wonders if these could be reflux symptoms.  She has had 2 cardiac catheterizations in the past 5 years both of which were stable and showed a patent stent.  No other significant obstructive disease was noted and it was thought that she might have small vessel/MINOCA findings.  Wt Readings from Last 3 Encounters:  08/13/22 188 lb (85.3 kg)  07/01/22 188 lb 9.6 oz (85.5 kg)  04/17/22 189 lb 12.8 oz (86.1 kg)     Past Medical History:  Diagnosis Date   Anemia    Arthritis    Bronchitis    hx of    CAD (coronary artery disease)    a. s/p DES to LAD in 2010 with patent stent by cath in 2014 b. low-risk NST in 11/2016   Cataracts, bilateral    Diabetes mellitus without complication (HCC)    Family  history of adverse reaction to anesthesia    pts mother had difficulty awakening    GERD (gastroesophageal reflux disease)    Hyperlipidemia LDL goal < 70    Hypertension    Lumbar back pain    Numbness    left leg and foot  Plantar fascia rupture    Left Foot   Sleep apnea    uses CPAP     Current Outpatient Medications  Medication Sig Dispense Refill   amLODipine (NORVASC) 10 MG tablet Take 1 tablet (10 mg total) by mouth daily. 90 tablet 1   aspirin EC 81 MG EC tablet Take 1 tablet (81 mg total) by mouth daily. 30 tablet 1   chlorpheniramine (CHLOR-TRIMETON) 4 MG tablet Take 1 tablet (4 mg total) by mouth daily. (Patient taking differently: Take 4 mg by mouth daily as needed for allergies.) 14 tablet 3   cholecalciferol (VITAMIN D) 1000 UNITS tablet Take 1,000 Units by mouth daily.     clonazePAM (KLONOPIN) 0.5 MG tablet Take 0.5-1 tablets (0.25-0.5 mg total) by mouth 2 (two) times daily as needed for anxiety. 30 tablet 1   clopidogrel (PLAVIX) 75 MG tablet Take 1 tablet (75 mg total) by mouth daily. 90 tablet 3   denosumab (PROLIA) 60 MG/ML SOSY injection Inject 60 mg into the skin every 6 (six) months. Will get at office 05/03/22 [facility] 180 mL 0   ezetimibe (ZETIA) 10 MG tablet Take 1 tablet (10 mg total) by mouth daily. 90 tablet 3   famotidine (PEPCID) 20 MG tablet Take 1 tablet (20 mg total) by mouth daily. 90 tablet 3   fenofibrate (TRICOR) 145 MG tablet Take 1 tablet by mouth daily. 90 tablet 3   FLUoxetine (PROZAC) 20 MG capsule Take 1 capsule (20 mg total) by mouth daily. 90 capsule 0   fluticasone (FLONASE) 50 MCG/ACT nasal spray Place 1 spray into both nostrils daily as needed for allergies or rhinitis. 16 g 11   ibuprofen (ADVIL) 200 MG tablet Take 400 mg by mouth daily as needed for mild pain (knee pain).     icosapent Ethyl (VASCEPA) 1 g capsule TAKE 2 CAPSULES BY MOUTH TWICE DAILY (Patient taking differently: Take 2 g by mouth daily.) 360 capsule 3    isosorbide mononitrate (IMDUR) 30 MG 24 hr tablet Take 1 tablet (30 mg total) by mouth daily. 90 tablet 2   Multiple Vitamin (MULTIVITAMIN WITH MINERALS) TABS tablet Take 1 tablet by mouth daily.     nitroGLYCERIN (NITROSTAT) 0.4 MG SL tablet Place 1 tablet under the tongue every 5 minutes as needed for chest pain. 25 tablet 2   RABEprazole (ACIPHEX) 20 MG tablet Take 1 tablet (20 mg total) by mouth daily 30 minutes prior to food 90 tablet 3   simvastatin (ZOCOR) 40 MG tablet Take 1 tablet (40 mg total) by mouth daily. 90 tablet 3   telmisartan (MICARDIS) 40 MG tablet Take 1 tablet (40 mg total) by mouth daily. Please keep scheduled appointment 90 tablet 3   No current facility-administered medications for this visit.    Allergies:    Allergies  Allergen Reactions   Crestor [Rosuvastatin] Other (See Comments)    myalgia   Niacin And Related Other (See Comments)    Whelps and skin flushed, mouth tingling    Social History:  The patient  reports that she has never smoked. She has never used smokeless tobacco. She reports current alcohol use. She reports that she does not use drugs.   Family history:   Family History  Problem Relation Age of Onset   Coronary artery disease Mother    Rheum arthritis Mother    Dementia Mother    Heart attack Father    Hypertension Father    Hyperlipidemia Father    Other Father  MVA   Hypertension Brother    Cancer Brother    Heart disease Brother    Cancer Paternal Grandmother        stomach   Diabetes Paternal Grandfather    Breast cancer Neg Hx     ROS: Pertinent items noted in HPI and remainder of comprehensive ROS otherwise negative.   PHYSICAL EXAM: VS:  BP 120/72   Pulse (!) 58   Ht 5' 1.5" (1.562 m)   Wt 188 lb (85.3 kg)   LMP  (LMP Unknown)   SpO2 99%   BMI 34.95 kg/m   General appearance: alert and no distress Neck: no carotid bruit, no JVD, and thyroid not enlarged, symmetric, no tenderness/mass/nodules Lungs:  clear to auscultation bilaterally Heart: regular rate and rhythm, S1, S2 normal, no murmur, click, rub or gallop and regular bradycardia Abdomen: soft, non-tender; bowel sounds normal; no masses,  no organomegaly Extremities: extremities normal, atraumatic, no cyanosis or edema Pulses: 2+ and symmetric Skin: Skin color, texture, turgor normal. No rashes or lesions Neurologic: Grossly normal Psych: Mildly anxious  EKG: Deferred  ASSESSMENT: Chest pain Symptomatic bradycardia Musculoskeletal chest pain -cath with no significant coronary disease and a patent stent in the LAD (10/2020) Normal Lexiscan Myoview and normal echo in 10/2020 Chronic RBBB Coronary artery disease status post DES the proximal LAD in 2010, low risk Myoview (11/2016)-LVEF 78% -no significant obstructive disease by cath in September 2021 Hypertension Dyslipidemia Obesity Type 2 diabetes Palpitations OSA on CPAP Anxiety  PLAN:  1.  [name] again has had chest pain although ruled out for MI.  Workup in the emergency department was normal.  I wonder if these could be GI symptoms.  She does take a PPI and H2 blocker although has been eating differently.  She has been eating more fatty foods recently and gets some occasional pain but behind her left shoulder blade.  This could be referred pain and might suggest gallbladder issue.  I encouraged her to discuss this further with her PCP if her symptoms return.  As she has had 2 cardiac catheterizations in the past 5 years including stress testing which were negative I would not pursue that at this time since her EKG and troponins in the emergency department were unremarkable.  If her symptoms worsen however that we may need to consider a repeat ischemia evaluation.  Chrystie Nose, MD, [facility], FACP  [place]  [facility]  Medical Director of the [facility]  [facility] Diplomate of the American Board of Clinical  Lipidology Attending Cardiologist  Direct Dial: [phone]  Fax: [phone]  Website:  [email or web address]

## 2022-08-20 ENCOUNTER — Other Ambulatory Visit (INDEPENDENT_AMBULATORY_CARE_PROVIDER_SITE_OTHER): Payer: PPO

## 2022-08-20 DIAGNOSIS — D649 Anemia, unspecified: Secondary | ICD-10-CM

## 2022-08-20 NOTE — Progress Notes (Unsigned)
[facility] at [facility] [address] [place], [place] [address] [phone] [phone]  Date:  08/22/2022   Name:  [name]   DOB:  09/03/1950   MRN:  [id number]  PCP:  Pearline Cables, MD    Chief Complaint: chest pain (ER follow up: chest pain 08/11/22. Was seen by Card on 08/11/22/-she says she has still been having pain. Pt says the EKG didn't look right that ems did on her at her home. She took Nitro at home x 3 before ems got to her. She then took 4 Asa 81 per dispatch instruction. Dr Rennis Golden suggested a workup for Gallbladder since he noticed no changes since last stint. /)   History of Present Illness:  [name] is a 72 y.o. very pleasant female patient who presents with the following:  Patient seen today for follow-up Most recent visit with myself was in April: History of hypertension, CAD, diabetes, sleep apnea, obesity, osteopenia with elevated fracture risk on fosamax Status post PCI 2010 and 2014 Last summer she was admitted with chest pain and had a cath, which was negative She had been on Fosamax-stopped by pulmonology, Dr. Sherene Sires in case it was worsening her reflux and shortness of breath. She now uses Prolia for her osteopenia  She was seen in the ER on 08/11/2022 with concern of chest pain-evaluated and released to home.  She followed up with cardiology, Dr. Rennis Golden on 5/21 [place] returns today for follow-up.  She was seen in the emergency department for her significant chest pain.  They had been at the beach for about a week and then she had symptoms of chest pain.  She says it came on and gradually dissipated after several hours.  They called EMS and they noted some mild EKG changes.  They advised transport to the hospital.  Workup was negative.  She notes that she has been eating fried fish on a daily basis when she was at the beach and wonders if these could be reflux symptoms.  She has had 2 cardiac catheterizations in the past  5 years both of which were stable and showed a patent stent.  No other significant obstructive disease was noted and it was thought that she might have small vessel/MINOCA findings.  1.  [name] again has had chest pain although ruled out for MI.  Workup in the emergency department was normal.  I wonder if these could be GI symptoms.  She does take a PPI and H2 blocker although has been eating differently.  She has been eating more fatty foods recently and gets some occasional pain but behind her left shoulder blade.  This could be referred pain and might suggest gallbladder issue.  I encouraged her to discuss this further with her PCP if her symptoms return.  As she has had 2 cardiac catheterizations in the past 5 years including stress testing which were negative I would not pursue that at this time since her EKG and troponins in the emergency department were unremarkable.  If her symptoms worsen however that we may need to consider a repeat ischemia evaluation.     Lab Results  Component Value Date   HGBA1C 7.4 (H) 07/01/2022   We did get a hemoccult test for her recently which was negative - collected hemoccult due to mild anemia   Colon cancer screening- may be due, colon 2016 but it looks like she was due for recall last  year -however, patient notes when she called her gastroenterologist she was told her follow-up had been extended for another year  We have done Hemoccult testing 3 times over the last few years and has been negative each time.  Mild anemia is likely a personal characteristic  Patient notes her pain has improved since she got home from the beach.  We discussed the possibility of gallbladder colic.  Patient notes she was eating fried fish every day during her trip, she did not think much of it because the fish was very lightly fried.  I advised her that it is certainly possible eating fried food when she is not used to it could trigger gallbladder colic Patient Active Problem  List   Diagnosis Date Noted   Primary osteoarthritis of right knee 08/30/2021   Cervical radiculopathy 08/16/2021   Shortness of breath 01/30/2021   Heartburn 01/30/2021   Chronic rhinitis 01/30/2021   Upper airway cough syndrome 11/09/2020   DOE (dyspnea on exertion) 11/09/2020   AKI (acute kidney injury) (HCC) 10/30/2020   Obstructive sleep apnea treated with continuous positive airway pressure (CPAP) 05/07/2018   Chest pain 01/31/2018   Morbid obesity (HCC) 11/03/2017   Coronary artery disease involving native coronary artery of native heart with unstable angina pectoris (HCC) 11/03/2017   Insomnia 11/03/2017   Inadequate sleep hygiene 11/03/2017   Anxiety 07/21/2017   Dizziness 10/02/2016   Right patella fracture 08/29/2016   Acquired foot deformity, left 07/18/2016   Osteopenia 11/10/2015   HNP (herniated nucleus pulposus), lumbar 10/12/2014   Spinal stenosis at L4-L5 level 10/12/2014   Iron deficiency anemia 07/29/2014   DM (diabetes mellitus) with complications (HCC) 07/29/2014   Environmental and seasonal allergies 04/28/2014   Spinal stenosis, lumbar region, with neurogenic claudication 01/06/2013   Obesity, morbid, BMI 40.0-49.9 (HCC) 10/20/2012   Chest pain with moderate risk of acute coronary syndrome 10/02/2012   CAD S/P percutaneous coronary angioplasty    Hyperlipidemia with target LDL less than 70    Essential hypertension 04/16/2012    Past Medical History:  Diagnosis Date   Anemia    Arthritis    Bronchitis    hx of    CAD (coronary artery disease)    a. s/p DES to LAD in 2010 with patent stent by cath in 2014 b. low-risk NST in 11/2016   Cataracts, bilateral    Diabetes mellitus without complication (HCC)    Family history of adverse reaction to anesthesia    pts mother had difficulty awakening    GERD (gastroesophageal reflux disease)    Hyperlipidemia LDL goal < 70    Hypertension    Lumbar back pain    Numbness    left leg and foot    Plantar  fascia rupture    Left Foot   Sleep apnea    uses CPAP     Past Surgical History:  Procedure Laterality Date   ABDOMINAL HYSTERECTOMY     BACK SURGERY     BREAST CYST ASPIRATION  1995   CAROTID STENT  2009   pt denies    CORONARY ANGIOPLASTY WITH STENT PLACEMENT  2010and 10-02-2012   Stent DES, Xience to prox. LAD   DOPPLER ECHOCARDIOGRAPHY  08/01/2009   EF=>55%,LV normal   LEFT HEART CATH AND CORONARY ANGIOGRAPHY N/A 12/10/2019   Procedure: LEFT HEART CATH AND CORONARY ANGIOGRAPHY;  Surgeon: Lyn Records, MD;  Location: [facility] INVASIVE CV LAB;  Service: Cardiovascular;  Laterality: N/A;   LEFT HEART CATH AND  CORONARY ANGIOGRAPHY N/A 10/30/2020   Procedure: LEFT HEART CATH AND CORONARY ANGIOGRAPHY;  Surgeon: Runell Gess, MD;  Location: [facility] INVASIVE CV LAB;  Service: Cardiovascular;  Laterality: N/A;   LEFT HEART CATHETERIZATION WITH CORONARY ANGIOGRAM N/A 10/02/2012   Procedure: LEFT HEART CATHETERIZATION WITH CORONARY ANGIOGRAM;  Surgeon: Runell Gess, MD;  Location: [facility] CATH LAB;  Service: Cardiovascular;  Laterality: N/A;   lower arterial duplex  06/20/10   abi's normal,rgt 0.98,lft 1.06;bilateral PVRs normal   LUMBAR LAMINECTOMY/DECOMPRESSION MICRODISCECTOMY Left 01/06/2013   Procedure: MICRO LUMBAR DECOMPRESSION L4-5 AND L5-S1;  Surgeon: Javier Docker, MD;  Location: [facility] ORS;  Service: Orthopedics;  Laterality: Left;   LUMBAR LAMINECTOMY/DECOMPRESSION MICRODISCECTOMY Left 10/12/2014   Procedure: REVISION MICRO LUMBAR/DECOMPRESSION L4-5 LEFT ;  Surgeon: Jene Every, MD;  Location: [facility] ORS;  Service: Orthopedics;  Laterality: Left;   NM MYOCAR PERF WALL MOTION  09/22/2008   lexiscan-EF 83%; glogal LV systolic fx is norm. ,evidence of mild ischemia basal anterior,midanterior and apical lateral region(s).    ORIF PATELLA Right 08/29/2016   Procedure: OPEN REDUCTION INTERNAL (ORIF) FIXATION RIGHT PATELLA;  Surgeon: Samson Frederic, MD;  Location: [facility] ORS;  Service: Orthopedics;   Laterality: Right;  Adductor Block   TUBAL LIGATION     TYMPANOPLASTY Bilateral    UVULOPALATOPHARYNGOPLASTY     pt denies     Social History   Tobacco Use   Smoking status: Never   Smokeless tobacco: Never  Vaping Use   Vaping Use: Never used  Substance Use Topics   Alcohol use: Yes    Comment: occasional, 1 a month wine   Drug use: No    Family History  Problem Relation Age of Onset   Coronary artery disease Mother    Rheum arthritis Mother    Dementia Mother    Heart attack Father    Hypertension Father    Hyperlipidemia Father    Other Father        MVA   Hypertension Brother    Cancer Brother    Heart disease Brother    Cancer Paternal Grandmother        stomach   Diabetes Paternal Grandfather    Breast cancer Neg Hx     Allergies  Allergen Reactions   Crestor [Rosuvastatin] Other (See Comments)    myalgia   Niacin And Related Other (See Comments)    Whelps and skin flushed, mouth tingling    Medication list has been reviewed and updated.  Current Outpatient Medications on File Prior to Visit  Medication Sig Dispense Refill   amLODipine (NORVASC) 10 MG tablet Take 1 tablet (10 mg total) by mouth daily. 90 tablet 1   aspirin EC 81 MG EC tablet Take 1 tablet (81 mg total) by mouth daily. 30 tablet 1   chlorpheniramine (CHLOR-TRIMETON) 4 MG tablet Take 1 tablet (4 mg total) by mouth daily. (Patient taking differently: Take 4 mg by mouth daily as needed for allergies.) 14 tablet 3   cholecalciferol (VITAMIN D) 1000 UNITS tablet Take 1,000 Units by mouth daily.     clonazePAM (KLONOPIN) 0.5 MG tablet Take 0.5-1 tablets (0.25-0.5 mg total) by mouth 2 (two) times daily as needed for anxiety. 30 tablet 1   clopidogrel (PLAVIX) 75 MG tablet Take 1 tablet (75 mg total) by mouth daily. 90 tablet 3   denosumab (PROLIA) 60 MG/ML SOSY injection Inject 60 mg into the skin every 6 (six) months. Will get at office 05/03/22 [facility] 180 mL 0  ezetimibe (ZETIA) 10  MG tablet Take 1 tablet (10 mg total) by mouth daily. 90 tablet 3   famotidine (PEPCID) 20 MG tablet Take 1 tablet (20 mg total) by mouth daily. 90 tablet 3   fenofibrate (TRICOR) 145 MG tablet Take 1 tablet by mouth daily. 90 tablet 3   FLUoxetine (PROZAC) 20 MG capsule Take 1 capsule (20 mg total) by mouth daily. 90 capsule 0   fluticasone (FLONASE) 50 MCG/ACT nasal spray Place 1 spray into both nostrils daily as needed for allergies or rhinitis. 16 g 11   ibuprofen (ADVIL) 200 MG tablet Take 400 mg by mouth daily as needed for mild pain (knee pain).     icosapent Ethyl (VASCEPA) 1 g capsule TAKE 2 CAPSULES BY MOUTH TWICE DAILY (Patient taking differently: Take 2 g by mouth daily.) 360 capsule 3   isosorbide mononitrate (IMDUR) 30 MG 24 hr tablet Take 1 tablet (30 mg total) by mouth daily. 90 tablet 2   Multiple Vitamin (MULTIVITAMIN WITH MINERALS) TABS tablet Take 1 tablet by mouth daily.     nitroGLYCERIN (NITROSTAT) 0.4 MG SL tablet Place 1 tablet under the tongue every 5 minutes as needed for chest pain. 25 tablet 2   RABEprazole (ACIPHEX) 20 MG tablet Take 1 tablet (20 mg total) by mouth daily 30 minutes prior to food 90 tablet 3   simvastatin (ZOCOR) 40 MG tablet Take 1 tablet (40 mg total) by mouth daily. 90 tablet 3   telmisartan (MICARDIS) 40 MG tablet Take 1 tablet (40 mg total) by mouth daily. Please keep scheduled appointment 90 tablet 3   [DISCONTINUED] rosuvastatin (CRESTOR) 20 MG tablet Take 1 tablet (20 mg total) by mouth daily. 30 tablet 3   No current facility-administered medications on file prior to visit.    Review of Systems:  As per HPI- otherwise negative.  BP Readings from Last 3 Encounters:  08/22/22 124/70  08/13/22 120/72  08/11/22 126/78      Physical Examination: Vitals:   08/22/22 0911  BP: 124/70  Pulse: 60  Resp: 18  Temp: 98 F (36.7 C)  SpO2: 97%   Vitals:   08/22/22 0911  Weight: 188 lb 3.2 oz (85.4 kg)  Height: 5' 1.5" (1.562 m)    Body mass index is 34.98 kg/m. Ideal Body Weight: Weight in (lb) to have BMI = 25: 134.2  GEN: no acute distress.  Mild obesity, looks well  HEENT: Atraumatic, Normocephalic.  Ears and Nose: No external deformity. CV: RRR, No M/G/R. No JVD. No thrill. No extra heart sounds. PULM: CTA B, no wheezes, crackles, rhonchi. No retractions. No resp. distress. No accessory muscle use. ABD: S, NT, ND, +BS. No rebound. No HSM.  Minimal epigastric tenderness on exam EXTR: No c/c/e PSYCH: Normally interactive. Conversant.    Assessment and Plan: Epigastric pain - Plan: US Abdomen Limited RUQ (LIVER/GB), CBC, Comprehensive metabolic panel  Patient seen today with chest and abdominal pain, she was seen at the ER and has been evaluated by cardiology does not feel this is cardiac disease.  Her cardiologist recommended that we do a gallbladder disease workup We are certainly glad to do so, ordered labs and a right upper quadrant ultrasound as above Advised her to avoid fatty foods while we await her gallbladder workup, let me know if any changes or worsening of her symptoms  Signed Abbe Amsterdam, MD  received her labs as below, message to patient  Results for orders placed or performed in visit on 08/22/22  CBC  Result Value Ref Range   WBC 9.4 4.0 - 10.5 K/uL   RBC 4.56 3.87 - 5.11 Mil/uL   Platelets 398.0 150.0 - 400.0 K/uL   Hemoglobin 11.4 (L) 12.0 - 15.0 g/dL   HCT 09.8 11.9 - 14.7 %   MCV 79.9 78.0 - 100.0 fl   MCHC 31.3 30.0 - 36.0 g/dL   RDW 82.9 56.2 - 13.0 %  Comprehensive metabolic panel  Result Value Ref Range   Sodium 141 135 - 145 mEq/L   Potassium 4.6 3.5 - 5.1 mEq/L   Chloride 104 96 - 112 mEq/L   CO2 27 19 - 32 mEq/L   Glucose, Bld 126 (H) 70 - 99 mg/dL   BUN 17 6 - 23 mg/dL   Creatinine, Ser 8.65 0.40 - 1.20 mg/dL   Total Bilirubin 0.5 0.2 - 1.2 mg/dL   Alkaline Phosphatase 40 39 - 117 U/L   AST 21 0 - 37 U/L   ALT 17 0 - 35 U/L   Total Protein 6.8 6.0 - 8.3  g/dL   Albumin 4.3 3.5 - 5.2 g/dL   GFR 78.46 (L) >96.29 mL/min   Calcium 9.8 8.4 - 10.5 mg/dL

## 2022-08-21 ENCOUNTER — Telehealth: Payer: Self-pay

## 2022-08-21 LAB — FECAL OCCULT BLOOD, IMMUNOCHEMICAL: Fecal Occult Bld: NEGATIVE

## 2022-08-21 NOTE — Telephone Encounter (Signed)
Transition Care Management Unsuccessful Follow-up Telephone Call  Date of discharge and from where:  [facility] 5/19  Attempts:  1st Attempt  Reason for unsuccessful TCM follow-up call:  Left voice message   Cainen Burnham [facility], [place] [phone] [address], [place], [place] [address] Phone: [phone] Email: [email or web address]       

## 2022-08-22 ENCOUNTER — Encounter: Payer: Self-pay | Admitting: Family Medicine

## 2022-08-22 ENCOUNTER — Ambulatory Visit (INDEPENDENT_AMBULATORY_CARE_PROVIDER_SITE_OTHER): Payer: PPO | Admitting: Family Medicine

## 2022-08-22 ENCOUNTER — Telehealth: Payer: Self-pay

## 2022-08-22 VITALS — BP 124/70 | HR 60 | Temp 98.0°F | Resp 18 | Ht 61.5 in | Wt 188.2 lb

## 2022-08-22 DIAGNOSIS — R1013 Epigastric pain: Secondary | ICD-10-CM

## 2022-08-22 LAB — CBC
HCT: 36.4 % (ref 36.0–46.0)
Hemoglobin: 11.4 g/dL — ABNORMAL LOW (ref 12.0–15.0)
MCHC: 31.3 g/dL (ref 30.0–36.0)
MCV: 79.9 fl (ref 78.0–100.0)
Platelets: 398 10*3/uL (ref 150.0–400.0)
RBC: 4.56 Mil/uL (ref 3.87–5.11)
RDW: 14.9 % (ref 11.5–15.5)
WBC: 9.4 10*3/uL (ref 4.0–10.5)

## 2022-08-22 LAB — COMPREHENSIVE METABOLIC PANEL
ALT: 17 U/L (ref 0–35)
AST: 21 U/L (ref 0–37)
Albumin: 4.3 g/dL (ref 3.5–5.2)
Alkaline Phosphatase: 40 U/L (ref 39–117)
BUN: 17 mg/dL (ref 6–23)
CO2: 27 mEq/L (ref 19–32)
Calcium: 9.8 mg/dL (ref 8.4–10.5)
Chloride: 104 mEq/L (ref 96–112)
Creatinine, Ser: 1.14 mg/dL (ref 0.40–1.20)
GFR: 47.98 mL/min — ABNORMAL LOW (ref >60.00)
Glucose, Bld: 126 mg/dL — ABNORMAL HIGH (ref 70–99)
Potassium: 4.6 mEq/L (ref 3.5–5.1)
Sodium: 141 mEq/L (ref 135–145)
Total Bilirubin: 0.5 mg/dL (ref 0.2–1.2)
Total Protein: 6.8 g/dL (ref 6.0–8.3)

## 2022-08-22 NOTE — Patient Instructions (Signed)
It was good to see you again today- I will be in touch with your labs and [place] report asap Please let me know if anything is changing or getting worse in the meantime

## 2022-08-22 NOTE — Telephone Encounter (Signed)
Transition Care Management Follow-up Telephone Call Date of discharge and from where: [name] 5/19 How have you been since you were released from the hospital? Much better Any questions or concerns? No  Items Reviewed: Did the pt receive and understand the discharge instructions provided? Yes  Medications obtained and verified? Yes  Other? No  Any new allergies since your discharge? No  Dietary orders reviewed? No Do you have support at home? Yes    Follow up appointments reviewed:  PCP Hospital f/u appt confirmed? Yes  Scheduled to see pcp on 5/30 @ . Specialist Hospital f/u appt confirmed? No  Scheduled to see  on  @ . Are transportation arrangements needed? No  If their condition worsens, is the pt aware to call PCP or go to the Emergency Dept.? Yes Was the patient provided with contact information for the PCP's office or ED? Yes Was to pt encouraged to call back with questions or concerns? Yes

## 2022-08-24 ENCOUNTER — Ambulatory Visit (HOSPITAL_BASED_OUTPATIENT_CLINIC_OR_DEPARTMENT_OTHER)
Admission: RE | Admit: 2022-08-24 | Discharge: 2022-08-24 | Disposition: A | Discharge status: Home / Self Care | Payer: PPO | Source: Ambulatory Visit | Attending: Family Medicine | Admitting: Family Medicine

## 2022-08-24 DIAGNOSIS — K219 Gastro-esophageal reflux disease without esophagitis: Secondary | ICD-10-CM | POA: Diagnosis not present

## 2022-08-24 DIAGNOSIS — R1013 Epigastric pain: Secondary | ICD-10-CM | POA: Insufficient documentation

## 2022-08-26 ENCOUNTER — Other Ambulatory Visit: Payer: Self-pay

## 2022-08-26 ENCOUNTER — Other Ambulatory Visit (HOSPITAL_COMMUNITY): Payer: Self-pay

## 2022-08-26 ENCOUNTER — Encounter: Payer: Self-pay | Admitting: Family Medicine

## 2022-08-27 ENCOUNTER — Other Ambulatory Visit: Payer: Self-pay

## 2022-08-29 ENCOUNTER — Telehealth: Payer: Self-pay | Admitting: Pharmacist

## 2022-08-29 ENCOUNTER — Encounter: Payer: Self-pay | Admitting: Pharmacist

## 2022-08-29 NOTE — Telephone Encounter (Signed)
Patient has follow up phone appointment with Clinical Pharmacist Practitioner 09/04/2022 - Clinical Pharmacist Practitioner needs to reschedule this appointment because she will be out of the office on 09/04/2022. Tried to call patient regarding rescheduled. Sent message on MyChart.

## 2022-08-31 ENCOUNTER — Other Ambulatory Visit: Payer: Self-pay | Admitting: Family Medicine

## 2022-08-31 ENCOUNTER — Other Ambulatory Visit: Payer: Self-pay | Admitting: Internal Medicine

## 2022-08-31 DIAGNOSIS — F411 Generalized anxiety disorder: Secondary | ICD-10-CM

## 2022-09-02 ENCOUNTER — Other Ambulatory Visit: Payer: Self-pay

## 2022-09-02 MED ORDER — AMLODIPINE BESYLATE 10 MG PO TABS
10.0000 mg | ORAL_TABLET | Freq: Every day | ORAL | 1 refills | Status: DC
  Filled 2022-09-02: qty 90, 90d supply, fill #0

## 2022-09-02 MED ORDER — FENOFIBRATE 145 MG PO TABS
145.0000 mg | ORAL_TABLET | Freq: Every day | ORAL | 3 refills | Status: AC
  Filled 2022-09-02 – 2023-01-01 (×2): qty 90, 90d supply, fill #0
  Filled 2023-04-11: qty 90, 90d supply, fill #1
  Filled 2023-07-08: qty 90, 90d supply, fill #2

## 2022-09-02 MED ORDER — FLUOXETINE HCL 20 MG PO CAPS
20.0000 mg | ORAL_CAPSULE | Freq: Every day | ORAL | 0 refills | Status: DC
Start: 2022-09-02 — End: 2023-01-01
  Filled 2022-09-02: qty 90, 90d supply, fill #0

## 2022-09-03 ENCOUNTER — Other Ambulatory Visit (HOSPITAL_COMMUNITY): Payer: Self-pay

## 2022-09-04 ENCOUNTER — Telehealth: Payer: PPO

## 2022-09-09 ENCOUNTER — Other Ambulatory Visit (HOSPITAL_COMMUNITY): Payer: Self-pay

## 2022-09-09 ENCOUNTER — Encounter: Payer: PPO | Admitting: Pharmacist

## 2022-09-09 ENCOUNTER — Ambulatory Visit (INDEPENDENT_AMBULATORY_CARE_PROVIDER_SITE_OTHER): Payer: PPO | Admitting: Pharmacist

## 2022-09-09 DIAGNOSIS — Z9861 Coronary angioplasty status: Secondary | ICD-10-CM

## 2022-09-09 DIAGNOSIS — M8580 Other specified disorders of bone density and structure, unspecified site: Secondary | ICD-10-CM

## 2022-09-09 DIAGNOSIS — I2510 Atherosclerotic heart disease of native coronary artery without angina pectoris: Secondary | ICD-10-CM

## 2022-09-09 DIAGNOSIS — I10 Essential (primary) hypertension: Secondary | ICD-10-CM

## 2022-09-09 DIAGNOSIS — E785 Hyperlipidemia, unspecified: Secondary | ICD-10-CM

## 2022-09-09 MED ORDER — SIMVASTATIN 40 MG PO TABS
40.0000 mg | ORAL_TABLET | Freq: Every day | ORAL | 3 refills | Status: DC
  Filled 2022-09-09: qty 90, 90d supply, fill #0
  Filled 2022-11-22: qty 90, 90d supply, fill #1
  Filled 2023-04-11: qty 90, 90d supply, fill #2

## 2022-09-09 MED ORDER — AMLODIPINE BESYLATE 10 MG PO TABS: 10.0000 mg | ORAL_TABLET | Freq: Every day | ORAL | Status: DC

## 2022-09-09 MED ORDER — ICOSAPENT ETHYL 1 G PO CAPS
1.0000 g | ORAL_CAPSULE | Freq: Two times a day (BID) | ORAL | 3 refills | Status: DC
Start: 2022-09-09 — End: 2023-07-02
  Filled 2022-09-09 – 2022-11-22 (×2): qty 180, 90d supply, fill #0

## 2022-09-09 NOTE — Progress Notes (Signed)
Pharmacy Note  09/09/2022 Name: [name] MRN: [id number] DOB: Apr 20, 1950  Subjective: [name] is a 72 y.o. year old female who is a primary care patient of Copland, [name], MD. Clinical Pharmacist Practitioner referral was placed to assist with medication management.    Engaged with patient by telephone for follow up visit today.   Mixed hyperlipidemia -  Patient sees Dr Rennis Golden, cardiology for lipid management. Lipids have improved by not at goal yet. We assisted patient with applying for [employer] - hypercholesterolemia grant due to cost of generic Vascepa. Kennedy Bucker will end 04/18/2023 Patient's current anti-lipid treatment is: simvastatin 40mg  daily, Vascepa 1 gram - 2 daily, fenofibrate 145mg  daily and ezetimibe 10mg  daily.  She was not able to tolerate rosuvastatin or higher dose of Vascepa.  Osteopenia with high fracture risk - started Prolia January 2023. Last Prolia injection was 05/03/2022; Next due 10/18/2022;   Hypertension:  Patient was seen in ED for chest pain and variable blood pressure. She states blood pressure dropped and then was high. She states she was told to be careful about taking amlodipine and telmisartan. She reports recent blood pressure was 115/70 but she is concerned about taking 2 blood pressure medications together.    Medication Management: Discussed adherence and reviewed recent refill records.   Patient is using [facility] for mail order prescription.  Looks like she should be due to refill the following meds: icosapent / Vascepa, simvastatin, isosorbide, telmisartan.  Patient states she does not need telmisartan - has 1/2 a bottle.   SDOH (Social Determinants of Health) assessments and interventions performed:  SDOH Interventions    Flowsheet Row Chronic Care Management from 08/14/2021 in [facility]  SDOH Interventions   Financial Strain Interventions Other  (Comment)  [cost of Vascepa high - applied for [employer] and approved thru 03/2022]        Objective: Review of patient status, including review of consultants reports, laboratory and other test data, was performed as part of comprehensive.  Lab Results  Component Value Date   CREATININE 1.14 08/22/2022   CREATININE 1.13 (H) 08/11/2022   CREATININE 0.95 07/01/2022    Lab Results  Component Value Date   HGBA1C 7.4 (H) 07/01/2022       Component Value Date/Time   CHOL 180 01/25/2022 1202   TRIG 158 (H) 01/25/2022 1202   HDL 40 01/25/2022 1202   CHOLHDL 4.5 (H) 01/25/2022 1202   CHOLHDL 6.3 (H) 12/20/2019 0945   VLDL 30.8 07/01/2019 0921   LDLCALC 112 (H) 01/25/2022 1202   LDLCALC  12/20/2019 0945     Comment:     . LDL cholesterol not calculated. Triglyceride levels greater than 400 mg/dL invalidate calculated LDL results. . Reference range: <100 . Desirable range <100 mg/dL for primary prevention;   <70 mg/dL for patients with CHD or diabetic patients  with > or = 2 CHD risk factors. Marland Kitchen LDL-C is now calculated using the Martin-Hopkins  calculation, which is a validated novel method providing  better accuracy than the Friedewald equation in the  estimation of LDL-C.  Horald Pollen et al. Lenox Ahr. 4098;119(14): 2061-2068  (http://education.[facility].com/faq/FAQ164)    LDLDIRECT 82.0 11/16/2018 1010     Clinical ASCVD: Yes  The ASCVD Risk score (Arnett DK, et al., 2019) failed to calculate for the following reasons:   The patient has a prior MI or stroke diagnosis    BP Readings from Last  3 Encounters:  08/22/22 124/70  08/13/22 120/72  08/11/22 126/78     Allergies  Allergen Reactions   Crestor [Rosuvastatin] Other (See Comments)    myalgia   Niacin And Related Other (See Comments)    Whelps and skin flushed, mouth tingling    Medications Reviewed Today     Reviewed by Orlene Och, CMA (Certified Medical Assistant) on 08/13/22  at 1000  Med List Status: <None>   Medication Order Taking? Sig Documenting Provider Last Dose Status Informant  amLODipine (NORVASC) 10 MG tablet [id number] Yes Take 1 tablet (10 mg total) by mouth daily. Copland, [name], MD Taking Active   aspirin EC 81 MG EC tablet [id number] Yes Take 1 tablet (81 mg total) by mouth daily. Samson Frederic, MD Taking Active Self, Spouse/Significant Other           Med Note Cooper Render, CAMILLE Delice Bison Aug 11, 2022  5:42 PM) Took 7 tabs today  chlorpheniramine (CHLOR-TRIMETON) 4 MG tablet [id number] Yes Take 1 tablet (4 mg total) by mouth daily.  Patient taking differently: Take 4 mg by mouth daily as needed for allergies.   Copland, [name], MD Taking Active   cholecalciferol (VITAMIN D) 1000 UNITS tablet [id number] Yes Take 1,000 Units by mouth daily. [provider] Taking Active Self, Spouse/Significant Other  clonazePAM (KLONOPIN) 0.5 MG tablet [id number] Yes Take 0.5-1 tablets (0.25-0.5 mg total) by mouth 2 (two) times daily as needed for anxiety. Copland, [name], MD Taking Active   clopidogrel (PLAVIX) 75 MG tablet [id number] Yes Take 1 tablet (75 mg total) by mouth daily. Chrystie Nose, MD Taking Active   denosumab (PROLIA) 60 MG/ML SOSY injection [id number] Yes Inject 60 mg into the skin every 6 (six) months. Will get at office 05/03/22 [facility] Copland, [name], MD Taking Active   ezetimibe (ZETIA) 10 MG tablet [id number] Yes Take 1 tablet (10 mg total) by mouth daily. Chrystie Nose, MD Taking Active   famotidine (PEPCID) 20 MG tablet [id number] Yes Take 1 tablet (20 mg total) by mouth daily. Copland, [name], MD Taking Active   fenofibrate (TRICOR) 145 MG tablet [id number] Yes Take 1 tablet by mouth daily. Chrystie Nose, MD Taking Active   FLUoxetine (PROZAC) 20 MG capsule [id number] Yes Take 1 capsule (20 mg total) by mouth daily. Copland, [name], MD Taking Active   fluticasone (FLONASE) 50 MCG/ACT nasal spray [id number] Yes  Place 1 spray into both nostrils daily as needed for allergies or rhinitis. Copland, [name], MD Taking Active Self, Spouse/Significant Other  ibuprofen (ADVIL) 200 MG tablet [id number] Yes Take 400 mg by mouth daily as needed for mild pain (knee pain). [provider] Taking Active   icosapent Ethyl (VASCEPA) 1 g capsule [id number] Yes TAKE 2 CAPSULES BY MOUTH TWICE DAILY  Patient taking differently: Take 2 g by mouth daily.   Chrystie Nose, MD Taking Active Self, Spouse/Significant Other  isosorbide mononitrate (IMDUR) 30 MG 24 hr tablet [id number] Yes Take 1 tablet (30 mg total) by mouth daily. Chrystie Nose, MD Taking Active   Multiple Vitamin (MULTIVITAMIN WITH MINERALS) TABS tablet [id number] Yes Take 1 tablet by mouth daily. [provider] Taking Active Self, Spouse/Significant Other  nitroGLYCERIN (NITROSTAT) 0.4 MG SL tablet [id number] Yes Place 1 tablet under the tongue every 5 minutes as needed for chest pain. Copland, [name], MD Taking Active            Med Note [facility],  Genea Rheaume B   Tue Apr 02, 2022 10:01 PM)    RABEprazole (ACIPHEX) 20 MG tablet [id number] Yes Take 1 tablet (20 mg total) by mouth daily 30 minutes prior to food Copland, [name], MD Taking Active     Discontinued 02/27/20 2121 (Side effect (s))   simvastatin (ZOCOR) 40 MG tablet [id number] Yes Take 1 tablet (40 mg total) by mouth daily. Copland, [name], MD Taking Active Self, Spouse/Significant Other  telmisartan (MICARDIS) 40 MG tablet [id number] Yes Take 1 tablet (40 mg total) by mouth daily. Please keep scheduled appointment Chrystie Nose, MD Taking Active             Patient Active Problem List   Diagnosis Date Noted   Primary osteoarthritis of right knee 08/30/2021   Cervical radiculopathy 08/16/2021   Shortness of breath 01/30/2021   Heartburn 01/30/2021   Chronic rhinitis 01/30/2021   Upper airway cough syndrome 11/09/2020   DOE (dyspnea on exertion) 11/09/2020   AKI (acute  kidney injury) (HCC) 10/30/2020   Obstructive sleep apnea treated with continuous positive airway pressure (CPAP) 05/07/2018   Chest pain 01/31/2018   Morbid obesity (HCC) 11/03/2017   Coronary artery disease involving native coronary artery of native heart with unstable angina pectoris (HCC) 11/03/2017   Insomnia 11/03/2017   Inadequate sleep hygiene 11/03/2017   Anxiety 07/21/2017   Dizziness 10/02/2016   Right patella fracture 08/29/2016   Acquired foot deformity, left 07/18/2016   Osteopenia 11/10/2015   HNP (herniated nucleus pulposus), lumbar 10/12/2014   Spinal stenosis at L4-L5 level 10/12/2014   Iron deficiency anemia 07/29/2014   DM (diabetes mellitus) with complications (HCC) 07/29/2014   Environmental and seasonal allergies 04/28/2014   Spinal stenosis, lumbar region, with neurogenic claudication 01/06/2013   Obesity, morbid, BMI 40.0-49.9 (HCC) 10/20/2012   Chest pain with moderate risk of acute coronary syndrome 10/02/2012   CAD S/P percutaneous coronary angioplasty    Hyperlipidemia with target LDL less than 70    Essential hypertension 04/16/2012     Medication Assistance:  Lipids medications can be obtained with Lupita Shutter of $2500 - 04/17/2022 thru 04/17/2023    Assessment / Plan: Mixed hyperlipidemia -  Continue current anti-lipid treatment: simvastatin 40mg  daily, Vascepa 1 gram - 2 daily, fenofibrate 145mg  daily and ezetimibe 10mg  daily.  Updated prescriptions for Vascepa and simvastatin Continue to follow up with Dr Rennis Golden  Osteopenia with high fracture risk - started Prolia January 2023. Last Prolia injection was 05/03/2022 - next due around 10/25/2022 Will send request to Prolia team for estimate of benefits.   Medication Management:  Reviewed and updated medication list Discussed adherence and reviewed recent refill records.    Meds ordered this encounter  Medications   icosapent Ethyl (VASCEPA) 1 g capsule    Sig: Take 1 capsule (1 g total)  by mouth 2 (two) times daily.    Dispense:  180 capsule    Refill:  3    Use [facility] after HTA   simvastatin (ZOCOR) 40 MG tablet    Sig: Take 1 tablet (40 mg total) by mouth daily.    Dispense:  90 tablet    Refill:  3   amLODipine (NORVASC) 10 MG tablet    Sig: Take 1 tablet (10 mg total) by mouth at bedtime.     Follow Up:  Telephone follow up appointment with care management team member scheduled for:  3 to 4 months   Henrene Pastor, PharmD Clinical Pharmacist [facility]  -  [facility] [employer] [phone]

## 2022-09-10 ENCOUNTER — Other Ambulatory Visit: Payer: Self-pay

## 2022-09-10 ENCOUNTER — Encounter (HOSPITAL_COMMUNITY): Payer: Self-pay

## 2022-09-10 ENCOUNTER — Other Ambulatory Visit (HOSPITAL_COMMUNITY): Payer: Self-pay

## 2022-09-13 NOTE — Progress Notes (Signed)
Erroneous encounter This encounter was created in error - please disregard. 

## 2022-10-04 ENCOUNTER — Telehealth: Payer: Self-pay

## 2022-10-04 NOTE — Telephone Encounter (Signed)
Prolia VOB initiated via [email or web address]  Last Prolia inj: 05/07/22 Next Prolia inj DUE: 11/04/22

## 2022-10-07 ENCOUNTER — Other Ambulatory Visit (HOSPITAL_COMMUNITY): Payer: Self-pay

## 2022-10-07 NOTE — Telephone Encounter (Signed)
Pt ready for scheduling for PROLIA on or after : 11/04/22  Out-of-pocket cost due at time of visit: $327  Primary: [employer] Prolia co-insurance: 20% Admin fee co-insurance: 20%  Secondary: --- Prolia co-insurance:  Admin fee co-insurance:   Medical Benefit Details: Date Benefits were checked: 10/07/22 Deductible: NO/ Coinsurance: 20%/ Admin Fee: 20%  Prior Auth: N/A PA# Expiration Date:    Pharmacy benefit: Copay $200 If patient wants fill through the pharmacy benefit please send prescription to: [employer], and include estimated need by date in rx notes. Pharmacy will ship medication directly to the office.  Patient not eligible for Prolia Copay Card. Copay Card can make patient's cost as little as $25. Link to apply: [email or web address]://[email or web address]  ** This summary of benefits is an estimation of the patient's out-of-pocket cost. Exact cost may very based on individual plan coverage.

## 2022-10-08 NOTE — Telephone Encounter (Signed)
Left message on machine to call back  

## 2022-10-09 ENCOUNTER — Other Ambulatory Visit: Payer: Self-pay

## 2022-10-09 ENCOUNTER — Other Ambulatory Visit (HOSPITAL_COMMUNITY): Payer: Self-pay

## 2022-10-09 NOTE — Telephone Encounter (Signed)
Pt scheduled for 11/05/22 already.

## 2022-10-19 ENCOUNTER — Encounter: Payer: Self-pay | Admitting: Family Medicine

## 2022-10-23 ENCOUNTER — Encounter (INDEPENDENT_AMBULATORY_CARE_PROVIDER_SITE_OTHER): Payer: Self-pay

## 2022-10-25 ENCOUNTER — Other Ambulatory Visit: Payer: Self-pay

## 2022-10-29 ENCOUNTER — Telehealth: Payer: Self-pay | Admitting: *Deleted

## 2022-10-29 NOTE — Telephone Encounter (Signed)
Prolia injection received and put in fridge.

## 2022-11-04 ENCOUNTER — Other Ambulatory Visit (HOSPITAL_COMMUNITY): Payer: Self-pay

## 2022-11-05 ENCOUNTER — Ambulatory Visit (INDEPENDENT_AMBULATORY_CARE_PROVIDER_SITE_OTHER): Payer: PPO | Admitting: Neurology

## 2022-11-05 DIAGNOSIS — M8580 Other specified disorders of bone density and structure, unspecified site: Secondary | ICD-10-CM | POA: Diagnosis not present

## 2022-11-05 MED ORDER — DENOSUMAB 60 MG/ML ~~LOC~~ SOSY
60.0000 mg | PREFILLED_SYRINGE | Freq: Once | SUBCUTANEOUS | Status: AC
Start: 2022-11-05 — End: 2022-11-05
  Administered 2022-11-05: 60 mg via SUBCUTANEOUS

## 2022-11-05 NOTE — Progress Notes (Signed)
  Prolia 60 mg SQ , was administered right arm today. Patient tolerated injection.  Patient next injection due: 6 months, appt made: No- will schedule in 5 months after benefit re-submission  Patient supplied: yes

## 2022-11-11 ENCOUNTER — Other Ambulatory Visit (HOSPITAL_COMMUNITY): Payer: Self-pay

## 2022-11-13 ENCOUNTER — Telehealth: Payer: Self-pay | Admitting: *Deleted

## 2022-11-13 NOTE — Telephone Encounter (Signed)
Prolia given 11/05/2022

## 2022-11-20 ENCOUNTER — Other Ambulatory Visit (HOSPITAL_COMMUNITY): Payer: Self-pay

## 2022-11-22 ENCOUNTER — Other Ambulatory Visit (HOSPITAL_COMMUNITY): Payer: Self-pay

## 2022-11-22 ENCOUNTER — Other Ambulatory Visit: Payer: Self-pay | Admitting: Family Medicine

## 2022-11-22 ENCOUNTER — Other Ambulatory Visit: Payer: Self-pay

## 2022-11-22 DIAGNOSIS — E119 Type 2 diabetes mellitus without complications: Secondary | ICD-10-CM

## 2022-11-23 ENCOUNTER — Other Ambulatory Visit (HOSPITAL_COMMUNITY): Payer: Self-pay

## 2022-11-23 ENCOUNTER — Other Ambulatory Visit (HOSPITAL_BASED_OUTPATIENT_CLINIC_OR_DEPARTMENT_OTHER): Payer: Self-pay

## 2022-11-26 ENCOUNTER — Other Ambulatory Visit: Payer: Self-pay

## 2022-12-02 ENCOUNTER — Other Ambulatory Visit (HOSPITAL_COMMUNITY): Payer: Self-pay

## 2022-12-14 ENCOUNTER — Encounter (HOSPITAL_COMMUNITY): Payer: Self-pay

## 2022-12-20 ENCOUNTER — Other Ambulatory Visit: Payer: Self-pay | Admitting: Family Medicine

## 2022-12-20 ENCOUNTER — Other Ambulatory Visit: Payer: Self-pay

## 2022-12-20 ENCOUNTER — Other Ambulatory Visit (HOSPITAL_COMMUNITY): Payer: Self-pay

## 2022-12-20 DIAGNOSIS — R251 Tremor, unspecified: Secondary | ICD-10-CM

## 2022-12-20 MED ORDER — CLONAZEPAM 0.5 MG PO TABS
0.2500 mg | ORAL_TABLET | Freq: Two times a day (BID) | ORAL | 1 refills | Status: DC | PRN
Start: 2022-12-20 — End: 2023-05-07
  Filled 2022-12-20: qty 30, 15d supply, fill #0
  Filled 2023-03-16 – 2023-03-17 (×2): qty 30, 15d supply, fill #1

## 2022-12-23 ENCOUNTER — Other Ambulatory Visit: Payer: Self-pay

## 2022-12-25 ENCOUNTER — Other Ambulatory Visit (HOSPITAL_COMMUNITY): Payer: Self-pay

## 2022-12-28 NOTE — Patient Instructions (Incomplete)
Great to see you again today!  I will be in touch with your labs asap Assuming all is well please see me in about 6 months

## 2022-12-28 NOTE — Progress Notes (Unsigned)
[facility] [address], [address] [place], [place] [address] [phone]  Date:  01/01/2023   Name:  [name]   DOB:  Oct 08, 1950   MRN:  [id number]  PCP:  Pearline Cables, MD    Chief Complaint: No chief complaint on file.   History of Present Illness:  [name] is a 72 y.o. very pleasant female patient who presents with the following:  Patient seen today for periodic follow-up Most recent visit with myself was in May History of hypertension, CAD, diabetes, sleep apnea, obesity, osteopenia with elevated fracture risk on fosamax Status post PCI 2010 and 2014 Last summer she was admitted with chest pain and had a cath, which was negative She had been on Fosamax-stopped by pulmonology, Dr. Sherene Sires in case it was worsening her reflux and shortness of breath. She now uses Prolia for her osteopenia  Back in May she had recently been seen in the ER with abdominal pain.  Cardiology recommended a gallbladder ultrasound which was completed in June, fatty liver but gallbladder normal  Colon cancer screening-I believe patient said this is due next year, she was given an extension on her recall by GI Flu vaccine Recommend COVID booster this fall Due for urine micro  Some lab work done in May-mild renal insufficiency noted Lab Results  Component Value Date   HGBA1C 7.4 (H) 07/01/2022   Amlodipine Aspirin Plavix Prolia Zetia Vascepa Fenofibrate Simvastatin Telmisartan Fluoxetine  Patient Active Problem List   Diagnosis Date Noted   Primary osteoarthritis of right knee 08/30/2021   Cervical radiculopathy 08/16/2021   Shortness of breath 01/30/2021   Heartburn 01/30/2021   Chronic rhinitis 01/30/2021   Upper airway cough syndrome 11/09/2020   DOE (dyspnea on exertion) 11/09/2020   AKI (acute kidney injury) (HCC) 10/30/2020   Obstructive sleep apnea treated with continuous positive airway pressure (CPAP) 05/07/2018    Chest pain 01/31/2018   Morbid obesity (HCC) 11/03/2017   Coronary artery disease involving native coronary artery of native heart with unstable angina pectoris (HCC) 11/03/2017   Insomnia 11/03/2017   Inadequate sleep hygiene 11/03/2017   Anxiety 07/21/2017   Dizziness 10/02/2016   Right patella fracture 08/29/2016   Acquired foot deformity, left 07/18/2016   Osteopenia 11/10/2015   HNP (herniated nucleus pulposus), lumbar 10/12/2014   Spinal stenosis at L4-L5 level 10/12/2014   Iron deficiency anemia 07/29/2014   DM (diabetes mellitus) with complications (HCC) 07/29/2014   Environmental and seasonal allergies 04/28/2014   Spinal stenosis, lumbar region, with neurogenic claudication 01/06/2013   Obesity, morbid, BMI 40.0-49.9 (HCC) 10/20/2012   Chest pain with moderate risk of acute coronary syndrome 10/02/2012   CAD S/P percutaneous coronary angioplasty    Hyperlipidemia with target LDL less than 70    Essential hypertension 04/16/2012    Past Medical History:  Diagnosis Date   Anemia    Arthritis    Bronchitis    hx of    CAD (coronary artery disease)    a. s/p DES to LAD in 2010 with patent stent by cath in 2014 b. low-risk NST in 11/2016   Cataracts, bilateral    Diabetes mellitus without complication (HCC)    Family history of adverse reaction to anesthesia    pts mother had difficulty awakening    GERD (gastroesophageal reflux disease)    Hyperlipidemia LDL goal < 70    Hypertension    Lumbar back pain  Numbness    left leg and foot    Plantar fascia rupture    Left Foot   Sleep apnea    uses CPAP     Past Surgical History:  Procedure Laterality Date   ABDOMINAL HYSTERECTOMY     BACK SURGERY     BREAST CYST ASPIRATION  1995   CAROTID STENT  2009   pt denies    CORONARY ANGIOPLASTY WITH STENT PLACEMENT  2010and 10-02-2012   Stent DES, Xience to prox. LAD   DOPPLER ECHOCARDIOGRAPHY  08/01/2009   EF=>55%,LV normal   LEFT HEART CATH AND CORONARY  ANGIOGRAPHY N/A 12/10/2019   Procedure: LEFT HEART CATH AND CORONARY ANGIOGRAPHY;  Surgeon: Lyn Records, MD;  Location: [facility] INVASIVE CV LAB;  Service: Cardiovascular;  Laterality: N/A;   LEFT HEART CATH AND CORONARY ANGIOGRAPHY N/A 10/30/2020   Procedure: LEFT HEART CATH AND CORONARY ANGIOGRAPHY;  Surgeon: Runell Gess, MD;  Location: [facility] INVASIVE CV LAB;  Service: Cardiovascular;  Laterality: N/A;   LEFT HEART CATHETERIZATION WITH CORONARY ANGIOGRAM N/A 10/02/2012   Procedure: LEFT HEART CATHETERIZATION WITH CORONARY ANGIOGRAM;  Surgeon: Runell Gess, MD;  Location: [facility] CATH LAB;  Service: Cardiovascular;  Laterality: N/A;   lower arterial duplex  06/20/10   abi's normal,rgt 0.98,lft 1.06;bilateral PVRs normal   LUMBAR LAMINECTOMY/DECOMPRESSION MICRODISCECTOMY Left 01/06/2013   Procedure: MICRO LUMBAR DECOMPRESSION L4-5 AND L5-S1;  Surgeon: Javier Docker, MD;  Location: [facility] ORS;  Service: Orthopedics;  Laterality: Left;   LUMBAR LAMINECTOMY/DECOMPRESSION MICRODISCECTOMY Left 10/12/2014   Procedure: REVISION MICRO LUMBAR/DECOMPRESSION L4-5 LEFT ;  Surgeon: Jene Every, MD;  Location: [facility] ORS;  Service: Orthopedics;  Laterality: Left;   NM MYOCAR PERF WALL MOTION  09/22/2008   lexiscan-EF 83%; glogal LV systolic fx is norm. ,evidence of mild ischemia basal anterior,midanterior and apical lateral region(s).    ORIF PATELLA Right 08/29/2016   Procedure: OPEN REDUCTION INTERNAL (ORIF) FIXATION RIGHT PATELLA;  Surgeon: Samson Frederic, MD;  Location: [facility] ORS;  Service: Orthopedics;  Laterality: Right;  Adductor Block   TUBAL LIGATION     TYMPANOPLASTY Bilateral    UVULOPALATOPHARYNGOPLASTY     pt denies     Social History   Tobacco Use   Smoking status: Never   Smokeless tobacco: Never  Vaping Use   Vaping status: Never Used  Substance Use Topics   Alcohol use: Yes    Comment: occasional, 1 a month wine   Drug use: No    Family History  Problem Relation Age of Onset   Coronary  artery disease Mother    Rheum arthritis Mother    Dementia Mother    Heart attack Father    Hypertension Father    Hyperlipidemia Father    Other Father        MVA   Hypertension Brother    Cancer Brother    Heart disease Brother    Cancer Paternal Grandmother        stomach   Diabetes Paternal Grandfather    Breast cancer Neg Hx     Allergies  Allergen Reactions   Crestor [Rosuvastatin] Other (See Comments)    myalgia   Niacin And Related Other (See Comments)    Whelps and skin flushed, mouth tingling    Medication list has been reviewed and updated.  Current Outpatient Medications on File Prior to Visit  Medication Sig Dispense Refill   amLODipine (NORVASC) 10 MG tablet Take 1 tablet (10 mg total) by mouth at bedtime.  aspirin EC 81 MG EC tablet Take 1 tablet (81 mg total) by mouth daily. 30 tablet 1   chlorpheniramine (CHLOR-TRIMETON) 4 MG tablet Take 1 tablet (4 mg total) by mouth daily. (Patient taking differently: Take 4 mg by mouth daily as needed for allergies.) 14 tablet 3   cholecalciferol (VITAMIN D) 1000 UNITS tablet Take 1,000 Units by mouth daily.     clonazePAM (KLONOPIN) 0.5 MG tablet Take 0.5-1 tablets (0.25-0.5 mg total) by mouth 2 (two) times daily as needed for anxiety. 30 tablet 1   clopidogrel (PLAVIX) 75 MG tablet Take 1 tablet (75 mg total) by mouth daily. 90 tablet 3   denosumab (PROLIA) 60 MG/ML SOSY injection Inject 60 mg into the skin every 6 (six) months. Will get at office 05/03/22 [facility] 180 mL 0   ezetimibe (ZETIA) 10 MG tablet Take 1 tablet (10 mg total) by mouth daily. 90 tablet 3   famotidine (PEPCID) 20 MG tablet Take 1 tablet (20 mg total) by mouth daily. 90 tablet 3   fenofibrate (TRICOR) 145 MG tablet Take 1 tablet (145 mg) by mouth daily. 90 tablet 3   FLUoxetine (PROZAC) 20 MG capsule Take 1 capsule (20 mg total) by mouth daily. 90 capsule 0   fluticasone (FLONASE) 50 MCG/ACT nasal spray Place 1 spray into both nostrils  daily as needed for allergies or rhinitis. 16 g 11   ibuprofen (ADVIL) 200 MG tablet Take 400 mg by mouth daily as needed for mild pain (knee pain).     icosapent Ethyl (VASCEPA) 1 g capsule Take 1 capsule (1 g total) by mouth 2 (two) times daily. 180 capsule 3   isosorbide mononitrate (IMDUR) 30 MG 24 hr tablet Take 1 tablet (30 mg total) by mouth daily. 90 tablet 2   Multiple Vitamin (MULTIVITAMIN WITH MINERALS) TABS tablet Take 1 tablet by mouth daily.     nitroGLYCERIN (NITROSTAT) 0.4 MG SL tablet Place 1 tablet under the tongue every 5 minutes as needed for chest pain. 25 tablet 2   RABEprazole (ACIPHEX) 20 MG tablet Take 1 tablet (20 mg total) by mouth daily 30 minutes prior to food 90 tablet 3   simvastatin (ZOCOR) 40 MG tablet Take 1 tablet (40 mg total) by mouth daily. 90 tablet 3   telmisartan (MICARDIS) 40 MG tablet Take 1 tablet (40 mg total) by mouth daily. Please keep scheduled appointment 90 tablet 3   [DISCONTINUED] rosuvastatin (CRESTOR) 20 MG tablet Take 1 tablet (20 mg total) by mouth daily. 30 tablet 3   No current facility-administered medications on file prior to visit.    Review of Systems:  As per HPI- otherwise negative.   Physical Examination: There were no vitals filed for this visit. There were no vitals filed for this visit. There is no height or weight on file to calculate BMI. Ideal Body Weight:    GEN: no acute distress. HEENT: Atraumatic, Normocephalic.  Ears and Nose: No external deformity. CV: RRR, No M/G/R. No JVD. No thrill. No extra heart sounds. PULM: CTA B, no wheezes, crackles, rhonchi. No retractions. No resp. distress. No accessory muscle use. ABD: S, NT, ND, +BS. No rebound. No HSM. EXTR: No c/c/e PSYCH: Normally interactive. Conversant.    Assessment and Plan: ***  Signed Abbe Amsterdam, MD

## 2022-12-29 ENCOUNTER — Other Ambulatory Visit (HOSPITAL_BASED_OUTPATIENT_CLINIC_OR_DEPARTMENT_OTHER): Payer: Self-pay | Admitting: Internal Medicine

## 2022-12-29 DIAGNOSIS — I25118 Atherosclerotic heart disease of native coronary artery with other forms of angina pectoris: Secondary | ICD-10-CM

## 2022-12-31 ENCOUNTER — Other Ambulatory Visit (HOSPITAL_COMMUNITY): Payer: Self-pay

## 2022-12-31 MED ORDER — ISOSORBIDE MONONITRATE ER 30 MG PO TB24
30.0000 mg | ORAL_TABLET | Freq: Every day | ORAL | 2 refills | Status: DC
Start: 2022-12-31 — End: 2023-07-02
  Filled 2022-12-31: qty 90, 90d supply, fill #0
  Filled 2023-01-01 – 2023-04-11 (×2): qty 90, 90d supply, fill #1

## 2023-01-01 ENCOUNTER — Other Ambulatory Visit: Payer: Self-pay | Admitting: Family Medicine

## 2023-01-01 ENCOUNTER — Other Ambulatory Visit (HOSPITAL_COMMUNITY): Payer: Self-pay

## 2023-01-01 ENCOUNTER — Encounter: Payer: Self-pay | Admitting: Family Medicine

## 2023-01-01 ENCOUNTER — Ambulatory Visit (INDEPENDENT_AMBULATORY_CARE_PROVIDER_SITE_OTHER): Payer: PPO | Admitting: Family Medicine

## 2023-01-01 ENCOUNTER — Other Ambulatory Visit: Payer: Self-pay

## 2023-01-01 VITALS — BP 130/65 | HR 56 | Temp 97.8°F | Resp 18 | Ht 61.5 in | Wt 193.4 lb

## 2023-01-01 DIAGNOSIS — Z9861 Coronary angioplasty status: Secondary | ICD-10-CM | POA: Diagnosis not present

## 2023-01-01 DIAGNOSIS — R5383 Other fatigue: Secondary | ICD-10-CM

## 2023-01-01 DIAGNOSIS — D649 Anemia, unspecified: Secondary | ICD-10-CM

## 2023-01-01 DIAGNOSIS — E118 Type 2 diabetes mellitus with unspecified complications: Secondary | ICD-10-CM | POA: Diagnosis not present

## 2023-01-01 DIAGNOSIS — I2510 Atherosclerotic heart disease of native coronary artery without angina pectoris: Secondary | ICD-10-CM | POA: Diagnosis not present

## 2023-01-01 DIAGNOSIS — I10 Essential (primary) hypertension: Secondary | ICD-10-CM

## 2023-01-01 DIAGNOSIS — E785 Hyperlipidemia, unspecified: Secondary | ICD-10-CM | POA: Diagnosis not present

## 2023-01-01 DIAGNOSIS — F411 Generalized anxiety disorder: Secondary | ICD-10-CM

## 2023-01-01 LAB — LIPID PANEL
Cholesterol: 171 mg/dL (ref 0–200)
HDL: 31.7 mg/dL — ABNORMAL LOW (ref >39.00)
LDL Cholesterol: 64 mg/dL (ref 0–99)
NonHDL: 139.66
Total CHOL/HDL Ratio: 5
Triglycerides: 380 mg/dL — ABNORMAL HIGH (ref 0.0–149.0)
VLDL: 76 mg/dL — ABNORMAL HIGH (ref 0.0–40.0)

## 2023-01-01 LAB — MICROALBUMIN / CREATININE URINE RATIO
Creatinine,U: 77.8 mg/dL
Microalb Creat Ratio: 5.5 mg/g (ref 0.0–30.0)
Microalb, Ur: 4.3 mg/dL — ABNORMAL HIGH (ref 0.0–1.9)

## 2023-01-01 LAB — CBC
HCT: 37.3 % (ref 36.0–46.0)
Hemoglobin: 11.8 g/dL — ABNORMAL LOW (ref 12.0–15.0)
MCHC: 31.7 g/dL (ref 30.0–36.0)
MCV: 78.9 fL (ref 78.0–100.0)
Platelets: 342 10*3/uL (ref 150.0–400.0)
RBC: 4.73 Mil/uL (ref 3.87–5.11)
RDW: 14.6 % (ref 11.5–15.5)
WBC: 7.6 10*3/uL (ref 4.0–10.5)

## 2023-01-01 LAB — COMPREHENSIVE METABOLIC PANEL
ALT: 23 U/L (ref 0–35)
AST: 18 U/L (ref 0–37)
Albumin: 4 g/dL (ref 3.5–5.2)
Alkaline Phosphatase: 66 U/L (ref 39–117)
BUN: 20 mg/dL (ref 6–23)
CO2: 28 meq/L (ref 19–32)
Calcium: 9.7 mg/dL (ref 8.4–10.5)
Chloride: 103 meq/L (ref 96–112)
Creatinine, Ser: 0.83 mg/dL (ref 0.40–1.20)
GFR: 70.05 mL/min (ref >60.00)
Glucose, Bld: 159 mg/dL — ABNORMAL HIGH (ref 70–99)
Potassium: 4.4 meq/L (ref 3.5–5.1)
Sodium: 140 meq/L (ref 135–145)
Total Bilirubin: 0.3 mg/dL (ref 0.2–1.2)
Total Protein: 6 g/dL (ref 6.0–8.3)

## 2023-01-01 LAB — TSH: TSH: 1.98 u[IU]/mL (ref 0.35–5.50)

## 2023-01-01 LAB — VITAMIN D 25 HYDROXY (VIT D DEFICIENCY, FRACTURES): VITD: 48.58 ng/mL (ref 30.00–100.00)

## 2023-01-01 LAB — HEMOGLOBIN A1C: Hgb A1c MFr Bld: 7.3 % — ABNORMAL HIGH (ref 4.6–6.5)

## 2023-01-01 MED ORDER — FLUOXETINE HCL 20 MG PO CAPS
20.0000 mg | ORAL_CAPSULE | Freq: Every day | ORAL | 1 refills | Status: DC
Start: 2023-01-01 — End: 2023-07-02
  Filled 2023-01-01: qty 90, 90d supply, fill #0
  Filled 2023-04-11: qty 90, 90d supply, fill #1

## 2023-01-01 MED ORDER — AMLODIPINE BESYLATE 10 MG PO TABS
10.0000 mg | ORAL_TABLET | Freq: Every day | ORAL | 1 refills | Status: DC
  Filled 2023-01-01: qty 90, 90d supply, fill #0
  Filled 2023-04-11: qty 90, 90d supply, fill #1

## 2023-01-02 ENCOUNTER — Other Ambulatory Visit: Payer: Self-pay

## 2023-01-03 DIAGNOSIS — Q6689 Other  specified congenital deformities of feet: Secondary | ICD-10-CM | POA: Diagnosis not present

## 2023-01-03 DIAGNOSIS — M19072 Primary osteoarthritis, left ankle and foot: Secondary | ICD-10-CM | POA: Diagnosis not present

## 2023-01-03 DIAGNOSIS — M79672 Pain in left foot: Secondary | ICD-10-CM | POA: Diagnosis not present

## 2023-01-03 DIAGNOSIS — R2689 Other abnormalities of gait and mobility: Secondary | ICD-10-CM | POA: Diagnosis not present

## 2023-01-10 DIAGNOSIS — M25561 Pain in right knee: Secondary | ICD-10-CM | POA: Diagnosis not present

## 2023-02-10 ENCOUNTER — Ambulatory Visit: Payer: PPO | Attending: Internal Medicine | Admitting: Internal Medicine

## 2023-02-11 ENCOUNTER — Encounter: Payer: Self-pay | Admitting: Internal Medicine

## 2023-02-19 ENCOUNTER — Ambulatory Visit: Payer: PPO | Attending: Adult Health | Admitting: Adult Health

## 2023-02-19 ENCOUNTER — Encounter: Payer: Self-pay | Admitting: Adult Health

## 2023-02-19 VITALS — BP 126/56 | HR 61 | Ht 61.0 in | Wt 191.8 lb

## 2023-02-19 DIAGNOSIS — E7800 Pure hypercholesterolemia, unspecified: Secondary | ICD-10-CM | POA: Diagnosis not present

## 2023-02-19 DIAGNOSIS — I2510 Atherosclerotic heart disease of native coronary artery without angina pectoris: Secondary | ICD-10-CM

## 2023-02-19 DIAGNOSIS — I10 Essential (primary) hypertension: Secondary | ICD-10-CM

## 2023-02-19 NOTE — Progress Notes (Signed)
  Cardiology Office Note:  .   Date:  02/19/2023  ID:  [name], DOB 06-28-1950, MRN [id number] PCP: Pearline Cables, MD  [facility] Providers Cardiologist: Chrystie Nose, MD   }   History of Present Illness: .   [name] is a 72 y.o. female CAD with DES stent to prox. LAD in 2010 with residual disease in LCX 10-20% and RCA 20% lesion. She also has HTN, DM, dyslipidemia and obesity. Most recent cardiac cath 2022 showed Previously placed Prox LAD to Mid LAD stent (unknown type) is widely patent.   She comes today completely asymptomatic.  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  She and her husband travel a lot and she offers no complaints of chest pain, dyspnea on exertion or fatigue with amount of walking and sightseeing that they are doing.  She denies any palpitations or lower extremity edema.  She is medically compliant.  Her primary care provider is drawing labs every 6 months.  Most recent labs were in October 2024 which included c-Met, lipid profile, and CBC.  ROS: As above otherwise negative  Studies Reviewed: Marland Kitchen   EKG Interpretation Date/Time:  Wednesday February 19 2023 11:29:34 EST Ventricular Rate:  61 PR Interval:  176 QRS Duration:  156 QT Interval:  432 QTC Calculation: 434 R Axis:   19  Text Interpretation: Normal sinus rhythm Right bundle branch block When compared with ECG of 11-Aug-2022 12:51, PREVIOUS ECG IS PRESENT Confirmed by Joni Reining [phone]) on 02/19/2023 12:30:58 PM     Physical Exam:   VS:  BP (!) 126/56 (BP Location: Left Arm, Patient Position: Sitting, Cuff Size: Normal)   Pulse 61   Ht 5\' 1"  (1.549 m)   Wt 191 lb 12.8 oz (87 kg)   LMP  (LMP Unknown)   SpO2 97%   BMI 36.24 kg/m    Wt Readings from Last 3 Encounters:  02/19/23 191 lb 12.8 oz (87 kg)  01/01/23 193 lb 6.4 oz (87.7 kg)  08/22/22 188 lb 3.2 oz (85.4 kg)    GEN: Well nourished, well developed in no acute distress NECK: No JVD; No carotid bruits CARDIAC: RRR,  no murmurs, rubs, gallops RESPIRATORY:  Clear to auscultation without rales, wheezing or rhonchi  ABDOMEN: Soft, non-tender, non-distended EXTREMITIES:  No edema; No deformity   ASSESSMENT AND PLAN: .    Coronary artery disease: History of drug-eluting stent to the proximal LAD in 2010 with residual disease in the left circumflex and RCA.  Repeat cath in 2022 revealed patent stents.  She offers no cardiac complaints.  Will continue secondary prevention with blood pressure control, lipid management, purposeful exercise, and weight loss.  She remains on dual antiplatelet therapy with aspirin and Plavix.  Would recommend stopping this on follow-up.  2.  Hypertension: Excellent control of blood pressure today.  I would continue her on her amlodipine as directed.  3.  Hypercholesterolemia: Goal of LDL less than 70.  She has had labs drawn by primary care in October 2024.  Total cholesterol 171, LDL 64, HDL 31.7.  She remains on Zetia and Tricor, and Vascepa.         Signed, Bettey Mare. Liborio Nixon, ANP, AACC

## 2023-02-19 NOTE — Patient Instructions (Signed)
Medication Instructions:  No Changes  *If you need a refill on your cardiac medications before your next appointment, please call your pharmacy*   Lab Work: No Labs If you have labs (blood work) drawn today and your tests are completely normal, you will receive your results only by: MyChart Message (if you have MyChart) OR A paper copy in the mail If you have any lab test that is abnormal or we need to change your treatment, we will call you to review the results.   Testing/Procedures: No Testing   Follow-Up: At [facility], you and your health needs are our priority.  As part of our continuing mission to provide you with exceptional heart care, we have created designated Provider Care Teams.  These Care Teams include your primary Cardiologist (physician) and Advanced Practice Providers (APPs -  Physician Assistants and Nurse Practitioners) who all work together to provide you with the care you need, when you need it.  We recommend signing up for the patient portal called "MyChart".  Sign up information is provided on this After Visit Summary.  MyChart is used to connect with patients for Virtual Visits (Telemedicine).  Patients are able to view lab/test results, encounter notes, upcoming appointments, etc.  Non-urgent messages can be sent to your provider as well.   To learn more about what you can do with MyChart, go to [email or web address].    Your next appointment:   1 year(s)  Provider:   Chrystie Nose, MD

## 2023-02-24 ENCOUNTER — Other Ambulatory Visit (HOSPITAL_COMMUNITY): Payer: Self-pay

## 2023-02-27 NOTE — Progress Notes (Signed)
Established patient visit   Patient: [name]   DOB: [birth year]   72 y.o. Female  MRN: [id number] Visit Date: 02/28/2023  Today's healthcare provider: Alfredia Ferguson, PA-C   Cc. Cough, congestion, back pain  Subjective    Pt reports since last Thursday, back pain, productive cough with yellow mucous, nasal congestion, headache, chills. Denies sob.  Using robitussion otc, saline nasal spray.    Medications: Outpatient Medications Prior to Visit  Medication Sig   amLODipine (NORVASC) 10 MG tablet Take 1 tablet (10 mg total) by mouth at bedtime.   aspirin EC 81 MG EC tablet Take 1 tablet (81 mg total) by mouth daily.   chlorpheniramine (CHLOR-TRIMETON) 4 MG tablet Take 1 tablet (4 mg total) by mouth daily. (Patient taking differently: Take 4 mg by mouth daily as needed for allergies.)   cholecalciferol (VITAMIN D) 1000 UNITS tablet Take 1,000 Units by mouth daily.   clonazePAM (KLONOPIN) 0.5 MG tablet Take 0.5-1 tablets (0.25-0.5 mg total) by mouth 2 (two) times daily as needed for anxiety.   clopidogrel (PLAVIX) 75 MG tablet Take 1 tablet (75 mg total) by mouth daily.   denosumab (PROLIA) 60 MG/ML SOSY injection Inject 60 mg into the skin every 6 (six) months. Will get at office 05/03/22 [facility]   ezetimibe (ZETIA) 10 MG tablet Take 1 tablet (10 mg total) by mouth daily.   famotidine (PEPCID) 20 MG tablet Take 1 tablet (20 mg total) by mouth daily.   fenofibrate (TRICOR) 145 MG tablet Take 1 tablet (145 mg) by mouth daily.   FLUoxetine (PROZAC) 20 MG capsule Take 1 capsule (20 mg total) by mouth daily.   fluticasone (FLONASE) 50 MCG/ACT nasal spray Place 1 spray into both nostrils daily as needed for allergies or rhinitis.   ibuprofen (ADVIL) 200 MG tablet Take 400 mg by mouth daily as needed for mild pain (knee pain).   icosapent Ethyl (VASCEPA) 1 g capsule Take 1 capsule (1 g total) by mouth 2 (two) times daily.   isosorbide mononitrate (IMDUR) 30 MG 24 hr tablet  Take 1 tablet (30 mg total) by mouth daily.   Multiple Vitamin (MULTIVITAMIN WITH MINERALS) TABS tablet Take 1 tablet by mouth daily.   nitroGLYCERIN (NITROSTAT) 0.4 MG SL tablet Place 1 tablet under the tongue every 5 minutes as needed for chest pain.   RABEprazole (ACIPHEX) 20 MG tablet Take 1 tablet (20 mg total) by mouth daily 30 minutes prior to food   simvastatin (ZOCOR) 40 MG tablet Take 1 tablet (40 mg total) by mouth daily.   telmisartan (MICARDIS) 40 MG tablet Take 1 tablet (40 mg total) by mouth daily. Please keep scheduled appointment   No facility-administered medications prior to visit.    Review of Systems  Constitutional:  Positive for chills and fatigue. Negative for fever.  HENT:  Positive for congestion and sore throat.   Respiratory:  Positive for cough. Negative for shortness of breath.   Cardiovascular:  Negative for chest pain and leg swelling.  Gastrointestinal:  Negative for abdominal pain.  Musculoskeletal:  Positive for back pain.  Neurological:  Positive for headaches. Negative for dizziness.       Objective    BP (!) 150/72   Pulse 62   Temp (!) 97.5 F (36.4 C) (Oral)   Ht 5\' 1"  (1.549 m)   Wt 192 lb 6 oz (87.3 kg)   LMP  (LMP Unknown)   SpO2 99%   BMI 36.35 kg/m  Physical Exam Constitutional:      General: She is awake.     Appearance: She is well-developed.  HENT:     Head: Normocephalic.     Right Ear: Tympanic membrane normal.     Left Ear: Tympanic membrane normal.     Mouth/Throat:     Pharynx: Posterior oropharyngeal erythema present. No oropharyngeal exudate.  Eyes:     Conjunctiva/sclera: Conjunctivae normal.  Cardiovascular:     Rate and Rhythm: Normal rate and regular rhythm.     Heart sounds: Normal heart sounds.  Pulmonary:     Effort: Pulmonary effort is normal.     Breath sounds: Normal breath sounds. No wheezing, rhonchi or rales.  Skin:    General: Skin is warm.  Neurological:     Mental Status: She is alert and  oriented to person, place, and time.  Psychiatric:        Attention and Perception: Attention normal.        Mood and Affect: Mood normal.        Speech: Speech normal.        Behavior: Behavior is cooperative.     No results found for any visits on 02/28/23.  Assessment & Plan    Acute bacterial bronchitis -     Albuterol Sulfate HFA; Inhale 2 puffs into the lungs every 6 (six) hours as needed for wheezing or shortness of breath.  Dispense: 8 g; Refill: 2 -     Azithromycin; Take 2 tablets on day 1, then 1 tablet daily on days 2 through 5  Dispense: 6 tablet; Refill: 0 -     Benzonatate; Take 1 capsule (100 mg total) by mouth 2 (two) times daily as needed for cough.  Dispense: 20 capsule; Refill: 0   Lungs CTA but pt having a harder time w/ deep breaths. O2 normal Rx albuterol 1-2 puffs q 4-6 prn  Rx zpack, tessalon Hydrate, rest, tylenol  Return if symptoms worsen or fail to improve.       Alfredia Ferguson, PA-C  [facility] [phone] (phone) [phone] (fax)  [facility]

## 2023-02-28 ENCOUNTER — Other Ambulatory Visit (HOSPITAL_COMMUNITY): Payer: Self-pay

## 2023-02-28 ENCOUNTER — Encounter: Payer: Self-pay | Admitting: Physician Assistant

## 2023-02-28 ENCOUNTER — Other Ambulatory Visit: Payer: Self-pay

## 2023-02-28 ENCOUNTER — Encounter: Payer: Self-pay | Admitting: Pharmacist

## 2023-02-28 ENCOUNTER — Ambulatory Visit (INDEPENDENT_AMBULATORY_CARE_PROVIDER_SITE_OTHER): Payer: PPO | Admitting: Physician Assistant

## 2023-02-28 VITALS — BP 150/72 | HR 62 | Temp 97.5°F | Ht 61.0 in | Wt 192.4 lb

## 2023-02-28 DIAGNOSIS — B9689 Other specified bacterial agents as the cause of diseases classified elsewhere: Secondary | ICD-10-CM

## 2023-02-28 DIAGNOSIS — J208 Acute bronchitis due to other specified organisms: Secondary | ICD-10-CM | POA: Diagnosis not present

## 2023-02-28 MED ORDER — AZITHROMYCIN 250 MG PO TABS
ORAL_TABLET | ORAL | 0 refills | Status: AC
Start: 2023-02-28 — End: 2023-03-05
  Filled 2023-02-28 (×2): qty 6, 5d supply, fill #0

## 2023-02-28 MED ORDER — ALBUTEROL SULFATE HFA 108 (90 BASE) MCG/ACT IN AERS
2.0000 | INHALATION_SPRAY | Freq: Four times a day (QID) | RESPIRATORY_TRACT | 2 refills | Status: DC | PRN
Start: 2023-02-28 — End: 2023-08-17
  Filled 2023-02-28 (×2): qty 6.7, 25d supply, fill #0

## 2023-02-28 MED ORDER — BENZONATATE 100 MG PO CAPS
100.0000 mg | ORAL_CAPSULE | Freq: Two times a day (BID) | ORAL | 0 refills | Status: DC | PRN
Start: 2023-02-28 — End: 2024-02-17
  Filled 2023-02-28 (×2): qty 20, 10d supply, fill #0

## 2023-03-03 ENCOUNTER — Other Ambulatory Visit (HOSPITAL_BASED_OUTPATIENT_CLINIC_OR_DEPARTMENT_OTHER): Payer: Self-pay

## 2023-03-17 ENCOUNTER — Other Ambulatory Visit: Payer: Self-pay

## 2023-03-17 ENCOUNTER — Other Ambulatory Visit (HOSPITAL_COMMUNITY): Payer: Self-pay

## 2023-03-24 ENCOUNTER — Other Ambulatory Visit (HOSPITAL_BASED_OUTPATIENT_CLINIC_OR_DEPARTMENT_OTHER): Payer: Self-pay

## 2023-03-24 ENCOUNTER — Encounter: Payer: Self-pay | Admitting: Diagnostic Neuroimaging

## 2023-03-24 ENCOUNTER — Ambulatory Visit: Payer: PPO | Admitting: Diagnostic Neuroimaging

## 2023-03-24 ENCOUNTER — Telehealth: Payer: Self-pay | Admitting: Diagnostic Neuroimaging

## 2023-03-24 VITALS — BP 127/58 | HR 58 | Ht 61.0 in | Wt 192.0 lb

## 2023-03-24 DIAGNOSIS — R251 Tremor, unspecified: Secondary | ICD-10-CM | POA: Diagnosis not present

## 2023-03-24 DIAGNOSIS — F419 Anxiety disorder, unspecified: Secondary | ICD-10-CM

## 2023-03-24 DIAGNOSIS — R413 Other amnesia: Secondary | ICD-10-CM

## 2023-03-24 NOTE — Progress Notes (Signed)
[facility]  PATIENT: [name] DOB: 04/09/1950  REFERRING CLINICIAN: Copland, [name], MD  HISTORY FROM: patient and husband REASON FOR VISIT: follow up   HISTORICAL  CHIEF COMPLAINT:  Chief Complaint  Patient presents with   Follow-up    Patient in room #6 with her husband. Patient states she here to follow up for her memory issues and recently had a seizure episodes. Patent states she has a video of the episode she had at a restaurant.     HISTORY OF PRESENT ILLNESS:   UPDATE (03/24/23, VRP): Since last visit, doing well, until last Friday had tremor / shaking spell at restaurant (family shared the video clip; this looks like a non-epileptic spell). Patient was able to talk through the spell. 1 minute event. Patient was able to finish lunch, then go shopping, then return home later that evening. Some ongoing memory issues.   UPDATE (02/25/22, VRP): Since last visit, doing well until some more stress and anxiety, and more shaking and memory loss issues. Now on clonazepam and doing better.  UPDATE (09/12/21, VRP): Since last visit, doing well until April 2023. Then intermittent tremors, neck pain, aphasia and stuttering. Had more sig events in June 2023, leading to ER eval. Now doing a little better. Has been under more stress lately. Also saw sports med for neck pain issues.   UPDATE (08/25/17, VRP): Since last visit, doing well. Dizziness, lightheadedness have improved but not fully resolved. Seems to be affected with overthinking, anxiety and rapid movements. Does better when she is focused on other activities. Walking is better. Driving on limited basis now. Overall doing well.   UPDATE (01/08/17, VRP): Since last visit, was doing well until Aug 15, 2016 --> fell and hit head and broke right knee. Now with headaches, anxiety, nausea issues. Now on prozac and feeling a little better.  UPDATE 09/09/14: Since last visit, doing better with memory and sleep apnea  treatments. Having more issues with left foot/toe drop and back pain --> has MRI lumbar spine setup via ortho for reevaluation of known lumbar radiculopathy.   UPDATE 03/01/14: Since last visit, feeling better. Still with some stress, memory loss, but getting better. Had sleep study and dx'd with sleep apnea, planning to have titration study in Jan 2016 for CPAP.  PRIOR HPI (12/20/13): 72 year old right-handed female here for a list of memory loss. Patient noticed memory problems since 02/21/13. Husband noticed problems since February 2015. Patient having short-term memory problems, poor concentration attention, in the setting of significantly increased psychosocial stressors. She has had more trouble driving and cooking recently.  Patient was taking care of her brother for the past one year who was diagnosed with pancreatic cancer. He deteriorated and ultimately passed away in Feb 21, 2014. Patient placed some blame on herself for his death, related to recommending morphine drip. During his funeral, patient stepped into a hole in the ground and developed left foot, ankle injury and plantar fascia rupture. Related to this patient was unable to work. She had to have premature retirement. This contributed to increasing stress, depression and anxiety. Patient also had a fall at the beats with head trauma, low back pain and disc bulging. Patient had lumbar spine surgery, L4, L5, S1 microdecompression 2014. Patient has family history of dementia in her mother and father.    REVIEW OF SYSTEMS: Full 14 system review of systems performed and negative except: memory loss sleepiness blurred vision tremor.     ALLERGIES: Allergies  Allergen  Reactions   Crestor [Rosuvastatin] Other (See Comments)    myalgia   Niacin And Related Other (See Comments)    Whelps and skin flushed, mouth tingling    HOME MEDICATIONS: Outpatient Medications Prior to Visit  Medication Sig Dispense Refill   albuterol (VENTOLIN  HFA) 108 (90 Base) MCG/ACT inhaler Inhale 2 puffs into the lungs every 6 (six) hours as needed for wheezing or shortness of breath. 6.7 g 2   amLODipine (NORVASC) 10 MG tablet Take 1 tablet (10 mg total) by mouth at bedtime. 90 tablet 1   aspirin EC 81 MG EC tablet Take 1 tablet (81 mg total) by mouth daily. 30 tablet 1   benzonatate (TESSALON) 100 MG capsule Take 1 capsule (100 mg total) by mouth 2 (two) times daily as needed for cough. 20 capsule 0   chlorpheniramine (CHLOR-TRIMETON) 4 MG tablet Take 1 tablet (4 mg total) by mouth daily. (Patient taking differently: Take 4 mg by mouth daily as needed for allergies.) 14 tablet 3   cholecalciferol (VITAMIN D) 1000 UNITS tablet Take 1,000 Units by mouth daily.     clonazePAM (KLONOPIN) 0.5 MG tablet Take 0.5-1 tablets (0.25-0.5 mg total) by mouth 2 (two) times daily as needed for anxiety. 30 tablet 1   clopidogrel (PLAVIX) 75 MG tablet Take 1 tablet (75 mg total) by mouth daily. 90 tablet 3   denosumab (PROLIA) 60 MG/ML SOSY injection Inject 60 mg into the skin every 6 (six) months. Will get at office 05/03/22 [facility] 180 mL 0   ezetimibe (ZETIA) 10 MG tablet Take 1 tablet (10 mg total) by mouth daily. 90 tablet 3   famotidine (PEPCID) 20 MG tablet Take 1 tablet (20 mg total) by mouth daily. 90 tablet 3   fenofibrate (TRICOR) 145 MG tablet Take 1 tablet (145 mg) by mouth daily. 90 tablet 3   FLUoxetine (PROZAC) 20 MG capsule Take 1 capsule (20 mg total) by mouth daily. 90 capsule 1   fluticasone (FLONASE) 50 MCG/ACT nasal spray Place 1 spray into both nostrils daily as needed for allergies or rhinitis. 16 g 11   ibuprofen (ADVIL) 200 MG tablet Take 400 mg by mouth daily as needed for mild pain (knee pain).     icosapent Ethyl (VASCEPA) 1 g capsule Take 1 capsule (1 g total) by mouth 2 (two) times daily. 180 capsule 3   isosorbide mononitrate (IMDUR) 30 MG 24 hr tablet Take 1 tablet (30 mg total) by mouth daily. 90 tablet 2   Multiple Vitamin  (MULTIVITAMIN WITH MINERALS) TABS tablet Take 1 tablet by mouth daily.     nitroGLYCERIN (NITROSTAT) 0.4 MG SL tablet Place 1 tablet under the tongue every 5 minutes as needed for chest pain. 25 tablet 2   RABEprazole (ACIPHEX) 20 MG tablet Take 1 tablet (20 mg total) by mouth daily 30 minutes prior to food 90 tablet 3   simvastatin (ZOCOR) 40 MG tablet Take 1 tablet (40 mg total) by mouth daily. 90 tablet 3   telmisartan (MICARDIS) 40 MG tablet Take 1 tablet (40 mg total) by mouth daily. Please keep scheduled appointment 90 tablet 3   No facility-administered medications prior to visit.    PAST MEDICAL HISTORY: Past Medical History:  Diagnosis Date   Anemia    Arthritis    Bronchitis    hx of    CAD (coronary artery disease)    a. s/p DES to LAD in 2010 with patent stent by cath in 2014 b. low-risk  NST in 11/2016   Cataracts, bilateral    Diabetes mellitus without complication (HCC)    Family history of adverse reaction to anesthesia    pts mother had difficulty awakening    GERD (gastroesophageal reflux disease)    Hyperlipidemia LDL goal < 70    Hypertension    Lumbar back pain    Numbness    left leg and foot    Plantar fascia rupture    Left Foot   Sleep apnea    uses CPAP     PAST SURGICAL HISTORY: Past Surgical History:  Procedure Laterality Date   ABDOMINAL HYSTERECTOMY     BACK SURGERY     BREAST CYST ASPIRATION  1995   CAROTID STENT  2009   pt denies    CORONARY ANGIOPLASTY WITH STENT PLACEMENT  2010and 10-02-2012   Stent DES, Xience to prox. LAD   DOPPLER ECHOCARDIOGRAPHY  08/01/2009   EF=>55%,LV normal   LEFT HEART CATH AND CORONARY ANGIOGRAPHY N/A 12/10/2019   Procedure: LEFT HEART CATH AND CORONARY ANGIOGRAPHY;  Surgeon: Lyn Records, MD;  Location: [facility];  Service: Cardiovascular;  Laterality: N/A;   LEFT HEART CATH AND CORONARY ANGIOGRAPHY N/A 10/30/2020   Procedure: LEFT HEART CATH AND CORONARY ANGIOGRAPHY;  Surgeon: Runell Gess, MD;   Location: [facility];  Service: Cardiovascular;  Laterality: N/A;   LEFT HEART CATHETERIZATION WITH CORONARY ANGIOGRAM N/A 10/02/2012   Procedure: LEFT HEART CATHETERIZATION WITH CORONARY ANGIOGRAM;  Surgeon: Runell Gess, MD;  Location: [facility] CATH LAB;  Service: Cardiovascular;  Laterality: N/A;   lower arterial duplex  06/20/10   abi's normal,rgt 0.98,lft 1.06;bilateral PVRs normal   LUMBAR LAMINECTOMY/DECOMPRESSION MICRODISCECTOMY Left 01/06/2013   Procedure: MICRO LUMBAR DECOMPRESSION L4-5 AND L5-S1;  Surgeon: Javier Docker, MD;  Location: [facility];  Service: Orthopedics;  Laterality: Left;   LUMBAR LAMINECTOMY/DECOMPRESSION MICRODISCECTOMY Left 10/12/2014   Procedure: REVISION MICRO LUMBAR/DECOMPRESSION L4-5 LEFT ;  Surgeon: Jene Every, MD;  Location: [facility];  Service: Orthopedics;  Laterality: Left;   NM MYOCAR PERF WALL MOTION  09/22/2008   lexiscan-EF 83%; glogal LV systolic fx is norm. ,evidence of mild ischemia basal anterior,midanterior and apical lateral region(s).    ORIF PATELLA Right 08/29/2016   Procedure: OPEN REDUCTION INTERNAL (ORIF) FIXATION RIGHT PATELLA;  Surgeon: Samson Frederic, MD;  Location: [facility];  Service: Orthopedics;  Laterality: Right;  Adductor Block   TUBAL LIGATION     TYMPANOPLASTY Bilateral    UVULOPALATOPHARYNGOPLASTY     pt denies     FAMILY HISTORY: Family History  Problem Relation Age of Onset   Coronary artery disease Mother    Rheum arthritis Mother    Dementia Mother    Heart attack Father    Hypertension Father    Hyperlipidemia Father    Other Father        MVA   Hypertension Brother    Cancer Brother    Heart disease Brother    Cancer Paternal Grandmother        stomach   Diabetes Paternal Grandfather    Breast cancer Neg Hx     SOCIAL HISTORY:  Social History   Socioeconomic History   Marital status: Married    Spouse name: [name]   Number of children: 2   Years of education: MSN   Highest education level: Master's  degree (e.g., MA, MS, MEng, MEd, MSW, MBA)  Occupational History   Occupation: DIRECTOR    Employer: [employer]  Tobacco Use   Smoking status: Never   Smokeless tobacco: Never  Vaping Use   Vaping status: Never Used  Substance and Sexual Activity   Alcohol use: Yes    Comment: occasional, 1 a month wine   Drug use: No   Sexual activity: Yes  Other Topics Concern   Not on file  Social History Narrative   Married to [employer]. Education: [employer]. Exercise: Yes   Patient lives at home with her spouse. retired   Caffeine use: 1 drink of tea a day   Social Drivers of Corporate investment banker Strain: Medium Risk (06/25/2022)   Overall Financial Resource Strain (CARDIA)    Difficulty of Paying Living Expenses: Somewhat hard  Food Insecurity: No Food Insecurity (06/25/2022)   Hunger Vital Sign    Worried About Running Out of Food in the Last Year: Never true    Ran Out of Food in the Last Year: Never true  Transportation Needs: No Transportation Needs (06/25/2022)   PRAPARE - Administrator, Civil Service (Medical): No    Lack of Transportation (Non-Medical): No  Physical Activity: Unknown (06/25/2022)   Exercise Vital Sign    Days of Exercise per Week: 0 days    Minutes of Exercise per Session: Not on file  Stress: No Stress Concern Present (06/25/2022)   [employer] of Occupational Health - Occupational Stress Questionnaire    Feeling of Stress : Only a little  Social Connections: Moderately Integrated (06/25/2022)   Social Connection and Isolation Panel [NHANES]    Frequency of Communication with Friends and Family: More than three times a week    Frequency of Social Gatherings with Friends and Family: Twice a week    Attends Religious Services: 1 to 4 times per year    Active Member of [employer] or Organizations: No    Attends Engineer, structural: Not on file    Marital Status: Married  Catering manager Violence: Not on file     PHYSICAL  EXAM   GENERAL EXAM/CONSTITUTIONAL: Vitals:  Vitals:   03/24/23 1311  BP: (!) 127/58  Pulse: (!) 58  Weight: 192 lb (87.1 kg)  Height: 5\' 1"  (1.549 m)   Body mass index is 36.28 kg/m. Wt Readings from Last 3 Encounters:  03/24/23 192 lb (87.1 kg)  02/28/23 192 lb 6 oz (87.3 kg)  02/19/23 191 lb 12.8 oz (87 kg)   Patient is in no distress; well developed, nourished and groomed; neck HAS  supple  CARDIOVASCULAR: Examination of carotid arteries is normal; no carotid bruits Regular rate and rhythm, no murmurs Examination of peripheral vascular system by observation and palpation is normal  EYES: Ophthalmoscopic exam of optic discs and posterior segments is normal; no papilledema or hemorrhages No results found.  MUSCULOSKELETAL: Gait, strength, tone, movements noted in Neurologic exam below  NEUROLOGIC: MENTAL STATUS:     03/24/2023    1:14 PM 12/20/2013   10:44 AM  MMSE - Mini Mental State Exam  Orientation to time 4 5  Orientation to Place 5 5  Registration 3 3  Attention/ Calculation 5 5  Recall 2 3  Language- name 2 objects 2 2  Language- repeat 1 1  Language- follow 3 step command 3 3  Language- read & follow direction 1 1  Write a sentence 1 1  Copy design 1 1  Total score 28 30   awake, alert, oriented to person, place and time recent and remote memory intact normal attention  and concentration language fluent, comprehension intact, naming intact fund of knowledge appropriate  CRANIAL NERVE:  2nd - no papilledema on fundoscopic exam 2nd, 3rd, 4th, 6th - pupils equal and reactive to light, visual fields full to confrontation, extraocular muscles intact, no nystagmus 5th - facial sensation symmetric 7th - facial strength symmetric 8th - hearing intact 9th - palate elevates symmetrically, uvula midline 11th - shoulder shrug symmetric 12th - tongue protrusion midline  MOTOR:  normal bulk and tone, full strength in the BUE, BLE  SENSORY:  normal  and symmetric to light touch, temperature, vibration  COORDINATION:  finger-nose-finger, fine finger movements normal  REFLEXES:  deep tendon reflexes TRACE and symmetric  GAIT/STATION:  narrow based gait    DIAGNOSTIC DATA (LABS, IMAGING, TESTING) - I reviewed patient records, labs, notes, testing and imaging myself where available.  Lab Results  Component Value Date   WBC 7.6 01/01/2023   HGB 11.8 (L) 01/01/2023   HCT 37.3 01/01/2023   MCV 78.9 01/01/2023   PLT 342.0 01/01/2023      Component Value Date/Time   NA 140 01/01/2023 0925   NA 143 01/25/2022 1202   K 4.4 01/01/2023 0925   CL 103 01/01/2023 0925   CO2 28 01/01/2023 0925   GLUCOSE 159 (H) 01/01/2023 0925   BUN 20 01/01/2023 0925   BUN 16 01/25/2022 1202   CREATININE 0.83 01/01/2023 0925   CREATININE 0.82 09/29/2014 0910   CALCIUM 9.7 01/01/2023 0925   PROT 6.0 01/01/2023 0925   PROT 6.9 01/25/2022 1202   ALBUMIN 4.0 01/01/2023 0925   ALBUMIN 4.4 01/25/2022 1202   AST 18 01/01/2023 0925   ALT 23 01/01/2023 0925   ALKPHOS 66 01/01/2023 0925   BILITOT 0.3 01/01/2023 0925   BILITOT 0.4 01/25/2022 1202   GFRNONAA 52 (L) 08/11/2022 1251   GFRAA >60 11/18/2019 1418   Lab Results  Component Value Date   CHOL 171 01/01/2023   HDL 31.70 (L) 01/01/2023   LDLCALC 64 01/01/2023   LDLDIRECT 82.0 11/16/2018   TRIG 380.0 (H) 01/01/2023   CHOLHDL 5 01/01/2023   Lab Results  Component Value Date   HGBA1C 7.3 (H) 01/01/2023   Lab Results  Component Value Date   VITAMINB12 440 12/27/2021   Lab Results  Component Value Date   TSH 1.98 01/01/2023    12/07/12 CT head - Negative head CT.  12/07/12 CT cervical spine - No acute bony injury in the cervical spine.  12/29/13 MRI brain 1. Few subcortical and juxtacortical foci of non-specific gliosis, likely chronic small vessel ischemic disease.    2. No acute findings.  12/28/16 MRI brain  - No cause of the presenting symptoms is identified. No post  traumatic finding. Normal except for a few punctate foci of T2 and FLAIR signal within the hemispheric white matter, probably subclinical.  09/03/21 MRI brain - No evidence of acute intracranial abnormality.     ASSESSMENT AND PLAN  72 y.o. year old female here with short term memory loss, poor attention/focus, since Nov 2014, in setting of increased personal stress (brother's illness and death, left foot injury, inability to work and premature retirement).   Then had fall and head injury in May 2018, with mild concussion and now post-concussion syndrome.   Then had neck pain, tremors, abnl spell since April-June 2023.   Dx:   1. Tremor   2. Anxiety   3. Memory loss      PLAN:  TREMOR, NECK PAIN, APHASIA, STUTTERING, MEMORY  LOSS - likely related to anxiety disorder and stress reaction - MRI brain unremarkable - continue to gradually increase activity - reviewed nutrition, fitness, sleep and relaxation techniques - follow up with PCP re: anxiety; follow up with psychology follow up; continue prozac and clonazepam  Return for pending if symptoms worsen or fail to improve, return to PCP.    Suanne Marker, MD 03/24/2023, 2:04 PM Certified in Neurology, Neurophysiology and Neuroimaging  [facility] [address], [address] [place], [place] [address] [phone]

## 2023-03-24 NOTE — Telephone Encounter (Signed)
 Pt confirming appt time

## 2023-03-26 DIAGNOSIS — I774 Celiac artery compression syndrome: Secondary | ICD-10-CM

## 2023-03-26 HISTORY — DX: Celiac artery compression syndrome: I77.4

## 2023-03-27 DIAGNOSIS — M25561 Pain in right knee: Secondary | ICD-10-CM | POA: Diagnosis not present

## 2023-04-03 DIAGNOSIS — M25661 Stiffness of right knee, not elsewhere classified: Secondary | ICD-10-CM | POA: Diagnosis not present

## 2023-04-09 ENCOUNTER — Telehealth: Payer: Self-pay

## 2023-04-09 DIAGNOSIS — Z8739 Personal history of other diseases of the musculoskeletal system and connective tissue: Secondary | ICD-10-CM

## 2023-04-09 MED ORDER — DENOSUMAB 60 MG/ML ~~LOC~~ SOSY
60.0000 mg | PREFILLED_SYRINGE | Freq: Once | SUBCUTANEOUS | Status: AC
Start: 2023-04-23 — End: 2023-05-13
  Administered 2023-05-13: 60 mg via SUBCUTANEOUS

## 2023-04-09 NOTE — Telephone Encounter (Signed)
 Prolia  was done 11/04/23 Next is due 05/10/23 CAM placed. Please check PA/Cost.

## 2023-04-09 NOTE — Telephone Encounter (Signed)
 CAM placed.  New message created and routed to PA team.

## 2023-04-10 ENCOUNTER — Other Ambulatory Visit: Payer: Self-pay

## 2023-04-10 ENCOUNTER — Telehealth: Payer: Self-pay

## 2023-04-10 ENCOUNTER — Other Ambulatory Visit (HOSPITAL_COMMUNITY): Payer: Self-pay

## 2023-04-10 NOTE — Telephone Encounter (Signed)
Prolia VOB initiated via [email or web address]  Next Prolia inj DUE: 05/10/23

## 2023-04-11 ENCOUNTER — Other Ambulatory Visit: Payer: Self-pay | Admitting: Internal Medicine

## 2023-04-11 ENCOUNTER — Other Ambulatory Visit (HOSPITAL_COMMUNITY): Payer: Self-pay

## 2023-04-11 NOTE — Telephone Encounter (Signed)
Pt ready for scheduling for PROLIA on or after : 05/10/23  Out-of-pocket cost due at time of visit: $332  Number of injection/visits approved: ---  Primary: [employer] Prolia co-insurance: 20% Admin fee co-insurance: 0%  Secondary: --- Prolia co-insurance:  Admin fee co-insurance:   Medical Benefit Details: Date Benefits were checked: 04/10/23 Deductible: NO/ Coinsurance: 20%/ Admin Fee: 0%  Prior Auth: N/A PA# Expiration Date:   # of doses approved:  Pharmacy benefit: Copay $345 If patient wants fill through the pharmacy benefit please send prescription to: [facility], and include estimated need by date in rx notes. Pharmacy will ship medication directly to the office.  Patient NOT eligible for Prolia Copay Card. Copay Card can make patient's cost as little as $25. Link to apply: [email or web address]://www.amgensupportplus.[email or web address]/copay  ** This summary of benefits is an estimation of the patient's out-of-pocket cost. Exact cost may very based on individual plan coverage.

## 2023-04-14 ENCOUNTER — Other Ambulatory Visit (HOSPITAL_COMMUNITY): Payer: Self-pay

## 2023-04-14 ENCOUNTER — Other Ambulatory Visit: Payer: Self-pay

## 2023-04-14 MED ORDER — TELMISARTAN 40 MG PO TABS
40.0000 mg | ORAL_TABLET | Freq: Every day | ORAL | 3 refills | Status: DC
  Filled 2023-04-14: qty 90, 90d supply, fill #0
  Filled 2023-07-08: qty 90, 90d supply, fill #1
  Filled 2023-12-25: qty 90, 90d supply, fill #2
  Filled 2024-03-19 (×2): qty 90, 90d supply, fill #3

## 2023-04-17 ENCOUNTER — Other Ambulatory Visit: Payer: Self-pay

## 2023-04-17 ENCOUNTER — Other Ambulatory Visit: Payer: Self-pay | Admitting: Family Medicine

## 2023-04-17 ENCOUNTER — Other Ambulatory Visit: Payer: Self-pay | Admitting: *Deleted

## 2023-04-17 DIAGNOSIS — E119 Type 2 diabetes mellitus without complications: Secondary | ICD-10-CM | POA: Diagnosis not present

## 2023-04-17 DIAGNOSIS — H26492 Other secondary cataract, left eye: Secondary | ICD-10-CM | POA: Diagnosis not present

## 2023-04-17 DIAGNOSIS — M8580 Other specified disorders of bone density and structure, unspecified site: Secondary | ICD-10-CM

## 2023-04-17 DIAGNOSIS — H52223 Regular astigmatism, bilateral: Secondary | ICD-10-CM | POA: Diagnosis not present

## 2023-04-17 DIAGNOSIS — H5203 Hypermetropia, bilateral: Secondary | ICD-10-CM | POA: Diagnosis not present

## 2023-04-17 DIAGNOSIS — M25661 Stiffness of right knee, not elsewhere classified: Secondary | ICD-10-CM | POA: Diagnosis not present

## 2023-04-17 DIAGNOSIS — Z1231 Encounter for screening mammogram for malignant neoplasm of breast: Secondary | ICD-10-CM

## 2023-04-17 DIAGNOSIS — H524 Presbyopia: Secondary | ICD-10-CM | POA: Diagnosis not present

## 2023-04-17 LAB — HM DIABETES EYE EXAM

## 2023-04-21 NOTE — Telephone Encounter (Signed)
Prolia BIV in separate encounter.

## 2023-04-24 ENCOUNTER — Other Ambulatory Visit: Payer: Self-pay

## 2023-04-24 DIAGNOSIS — M25661 Stiffness of right knee, not elsewhere classified: Secondary | ICD-10-CM | POA: Diagnosis not present

## 2023-04-24 DIAGNOSIS — M25561 Pain in right knee: Secondary | ICD-10-CM | POA: Diagnosis not present

## 2023-04-28 NOTE — Telephone Encounter (Signed)
 Patient scheduled for 05/13/23.

## 2023-05-05 ENCOUNTER — Other Ambulatory Visit (HOSPITAL_COMMUNITY): Payer: Self-pay

## 2023-05-07 ENCOUNTER — Other Ambulatory Visit (HOSPITAL_COMMUNITY): Payer: Self-pay

## 2023-05-07 ENCOUNTER — Other Ambulatory Visit: Payer: Self-pay

## 2023-05-07 ENCOUNTER — Other Ambulatory Visit: Payer: Self-pay | Admitting: Family Medicine

## 2023-05-07 DIAGNOSIS — R251 Tremor, unspecified: Secondary | ICD-10-CM

## 2023-05-07 MED ORDER — CLONAZEPAM 0.5 MG PO TABS
0.2500 mg | ORAL_TABLET | Freq: Two times a day (BID) | ORAL | 1 refills | Status: DC | PRN
  Filled 2023-05-07: qty 30, 15d supply, fill #0
  Filled 2023-07-15: qty 30, 15d supply, fill #1

## 2023-05-07 NOTE — Telephone Encounter (Signed)
Requesting: Klonopin 0.5 mg  Contract: N/A UDS: N/A Last Visit: 02/28/2023 Next Visit: 07/02/2023 Last Refill: 12/20/2022  Please Advise

## 2023-05-12 NOTE — Progress Notes (Deleted)
   Subjective:    Patient ID: [name], female    DOB: 1950-11-05, 73 y.o.   MRN: [id number]  HPI    Review of Systems     Objective:   Physical Exam        Assessment & Plan:

## 2023-05-12 NOTE — Progress Notes (Unsigned)
[name] is a 73 y.o. female presents to the office today for Prolia injection, per physician's orders. Prolia 60 mg SQ , was administered L arm today. Patient tolerated injection. Patient next injection due: 6 months, appt made: No- will schedule in 5 months after benifits are ran again Initial injection: NO Patient supplied: NO  DOD: Almyra Brace, Feliberto Harts

## 2023-05-13 ENCOUNTER — Ambulatory Visit (INDEPENDENT_AMBULATORY_CARE_PROVIDER_SITE_OTHER): Payer: PPO

## 2023-05-13 ENCOUNTER — Telehealth: Payer: Self-pay

## 2023-05-13 DIAGNOSIS — Z8739 Personal history of other diseases of the musculoskeletal system and connective tissue: Secondary | ICD-10-CM

## 2023-05-13 MED ORDER — DENOSUMAB 60 MG/ML ~~LOC~~ SOSY
60.0000 mg | PREFILLED_SYRINGE | SUBCUTANEOUS | Status: AC
  Administered 2023-11-10: 60 mg via SUBCUTANEOUS

## 2023-05-13 NOTE — Telephone Encounter (Signed)
Prolia was done 05/13/23 Next is due 11/10/23 CAM placed.

## 2023-05-13 NOTE — Telephone Encounter (Signed)
Pt is coming in today 05/13/23.     Copied from CRM [id number]. Topic: Appointments - Appointment Scheduling >> May 13, 2023 10:41 AM Isabell A wrote: Patient/patient representative is calling to schedule an appointment. Refer to attachments for appointment information.    Patient calling to reschedule Prolia injection.

## 2023-05-14 ENCOUNTER — Other Ambulatory Visit: Payer: Self-pay

## 2023-05-14 ENCOUNTER — Other Ambulatory Visit (HOSPITAL_COMMUNITY): Payer: Self-pay

## 2023-05-14 ENCOUNTER — Telehealth (INDEPENDENT_AMBULATORY_CARE_PROVIDER_SITE_OTHER): Payer: PPO | Admitting: Family Medicine

## 2023-05-14 ENCOUNTER — Other Ambulatory Visit (HOSPITAL_BASED_OUTPATIENT_CLINIC_OR_DEPARTMENT_OTHER): Payer: Self-pay

## 2023-05-14 ENCOUNTER — Encounter: Payer: Self-pay | Admitting: Pharmacist

## 2023-05-14 DIAGNOSIS — R12 Heartburn: Secondary | ICD-10-CM

## 2023-05-14 DIAGNOSIS — R413 Other amnesia: Secondary | ICD-10-CM

## 2023-05-14 DIAGNOSIS — R296 Repeated falls: Secondary | ICD-10-CM | POA: Diagnosis not present

## 2023-05-14 DIAGNOSIS — E118 Type 2 diabetes mellitus with unspecified complications: Secondary | ICD-10-CM

## 2023-05-14 DIAGNOSIS — I10 Essential (primary) hypertension: Secondary | ICD-10-CM | POA: Diagnosis not present

## 2023-05-14 MED ORDER — RABEPRAZOLE SODIUM 20 MG PO TBEC
20.0000 mg | DELAYED_RELEASE_TABLET | Freq: Every day | ORAL | 3 refills | Status: AC
  Filled 2023-05-14: qty 90, 90d supply, fill #0
  Filled 2023-08-08: qty 90, 90d supply, fill #1

## 2023-05-14 NOTE — Progress Notes (Signed)
[facility] [address], [address] [place], [place] [address] [phone]  Date:  05/14/2023   Name:  [name]   DOB:  [birth year]   MRN:  [id number]  PCP:  Pearline Cables, MD    Chief Complaint: Recent falls (Pt would like to discuss what could be causing it. Pt was seen in office on 05/13/23 for Prolia injection and did seem a bit more confused. )   History of Present Illness:  [name] is a 73 y.o. very pleasant female patient who presents with the following:  Virtual visit today due to weather Pt location is home, my location is office  Pt ID confirmed with 2 factors, she gives consent for a virtual visit today.  Pt, myself and her husband are on the call today  Audio only as her video is not working  History of hypertension, CAD, diabetes, sleep apnea, obesity, osteopenia with elevated fracture risk on fosamax Status post PCI 2010 and 2014 Last summer she was admitted with chest pain and had a cath, which was negative She had been on Fosamax-stopped by pulmonology, Dr. Sherene Sires in case it was worsening her reflux and shortness of breath. She now uses Prolia for her osteopenia- she does feel like being off fosamax is helping her   Pt notes she fell twice recently -she did not trip or have any other definite cause for fall, seemed more like she lost her balance She was doing laundry and lost her balance and fell in between the washer and the wall She bruised her arm.  Did not hit her head  She notes she also had another fall recently when she slipped off the couch- this occurred about one month ago She has had some knee pain but does not feel like her knee caused her to fall She just did 5 weeks of PT for her knee They also plan to do some PT for her left ankle per Dr Victorino Dike  We recall Camila went through a time of more frequent falls back in about 2022 and would also had episodes of shaking- this did seem to get better  spontaneously.  Some suspicion this may have been due to anxiety, possibly related to concern about falling due to orthopedic issue  She was seen by neurology- Dr Marjory Lies- just seen at the end of December  Last MRI of her brain a little under 2 years ago  TREMOR, NECK PAIN, APHASIA, STUTTERING, MEMORY LOSS - likely related to anxiety disorder and stress reaction - MRI brain unremarkable - continue to gradually increase activity - reviewed nutrition, fitness, sleep and relaxation techniques - follow up with PCP re: anxiety; follow up with psychology follow up; continue prozac and clonazepam       Patient Active Problem List   Diagnosis Date Noted   Primary osteoarthritis of right knee 08/30/2021   Cervical radiculopathy 08/16/2021   Shortness of breath 01/30/2021   Heartburn 01/30/2021   Chronic rhinitis 01/30/2021   Upper airway cough syndrome 11/09/2020   DOE (dyspnea on exertion) 11/09/2020   AKI (acute kidney injury) (HCC) 10/30/2020   Obstructive sleep apnea treated with continuous positive airway pressure (CPAP) 05/07/2018   Chest pain 01/31/2018   Morbid obesity (HCC) 11/03/2017   Coronary artery disease involving native coronary artery of native heart with unstable angina pectoris (HCC) 11/03/2017   Insomnia 11/03/2017   Inadequate sleep hygiene 11/03/2017   Anxiety 07/21/2017  Dizziness 10/02/2016   Right patella fracture 08/29/2016   Acquired foot deformity, left 07/18/2016   Osteopenia 11/10/2015   HNP (herniated nucleus pulposus), lumbar 10/12/2014   Spinal stenosis at L4-L5 level 10/12/2014   Iron deficiency anemia 07/29/2014   DM (diabetes mellitus) with complications (HCC) 07/29/2014   Environmental and seasonal allergies 04/28/2014   Spinal stenosis, lumbar region, with neurogenic claudication 01/06/2013   Obesity, morbid, BMI 40.0-49.9 (HCC) 10/20/2012   Chest pain with moderate risk of acute coronary syndrome 10/02/2012   CAD S/P percutaneous coronary  angioplasty    Hyperlipidemia with target LDL less than 70    Essential hypertension 04/16/2012    Past Medical History:  Diagnosis Date   Anemia    Arthritis    Bronchitis    hx of    CAD (coronary artery disease)    a. s/p DES to LAD in 2010 with patent stent by cath in 2014 b. low-risk NST in 11/2016   Cataracts, bilateral    Diabetes mellitus without complication (HCC)    Family history of adverse reaction to anesthesia    pts mother had difficulty awakening    GERD (gastroesophageal reflux disease)    Hyperlipidemia LDL goal < 70    Hypertension    Lumbar back pain    Numbness    left leg and foot    Plantar fascia rupture    Left Foot   Sleep apnea    uses CPAP     Past Surgical History:  Procedure Laterality Date   ABDOMINAL HYSTERECTOMY     BACK SURGERY     BREAST CYST ASPIRATION  1995   CAROTID STENT  2009   pt denies    CORONARY ANGIOPLASTY WITH STENT PLACEMENT  2010and 10-02-2012   Stent DES, Xience to prox. LAD   DOPPLER ECHOCARDIOGRAPHY  08/01/2009   EF=>55%,LV normal   LEFT HEART CATH AND CORONARY ANGIOGRAPHY N/A 12/10/2019   Procedure: LEFT HEART CATH AND CORONARY ANGIOGRAPHY;  Surgeon: Lyn Records, MD;  Location: [facility];  Service: Cardiovascular;  Laterality: N/A;   LEFT HEART CATH AND CORONARY ANGIOGRAPHY N/A 10/30/2020   Procedure: LEFT HEART CATH AND CORONARY ANGIOGRAPHY;  Surgeon: Runell Gess, MD;  Location: [facility];  Service: Cardiovascular;  Laterality: N/A;   LEFT HEART CATHETERIZATION WITH CORONARY ANGIOGRAM N/A 10/02/2012   Procedure: LEFT HEART CATHETERIZATION WITH CORONARY ANGIOGRAM;  Surgeon: Runell Gess, MD;  Location: [facility] CATH LAB;  Service: Cardiovascular;  Laterality: N/A;   lower arterial duplex  06/20/10   abi's normal,rgt 0.98,lft 1.06;bilateral PVRs normal   LUMBAR LAMINECTOMY/DECOMPRESSION MICRODISCECTOMY Left 01/06/2013   Procedure: MICRO LUMBAR DECOMPRESSION L4-5 AND L5-S1;  Surgeon: Javier Docker, MD;  Location: [facility];  Service: Orthopedics;  Laterality: Left;   LUMBAR LAMINECTOMY/DECOMPRESSION MICRODISCECTOMY Left 10/12/2014   Procedure: REVISION MICRO LUMBAR/DECOMPRESSION L4-5 LEFT ;  Surgeon: Jene Every, MD;  Location: [facility];  Service: Orthopedics;  Laterality: Left;   NM MYOCAR PERF WALL MOTION  09/22/2008   lexiscan-EF 83%; glogal LV systolic fx is norm. ,evidence of mild ischemia basal anterior,midanterior and apical lateral region(s).    ORIF PATELLA Right 08/29/2016   Procedure: OPEN REDUCTION INTERNAL (ORIF) FIXATION RIGHT PATELLA;  Surgeon: Samson Frederic, MD;  Location: [facility];  Service: Orthopedics;  Laterality: Right;  Adductor Block   TUBAL LIGATION     TYMPANOPLASTY Bilateral    UVULOPALATOPHARYNGOPLASTY     pt denies     Social History  Tobacco Use   Smoking status: Never   Smokeless tobacco: Never  Vaping Use   Vaping status: Never Used  Substance Use Topics   Alcohol use: Yes    Comment: occasional, 1 a month wine   Drug use: No    Family History  Problem Relation Age of Onset   Coronary artery disease Mother    Rheum arthritis Mother    Dementia Mother    Heart attack Father    Hypertension Father    Hyperlipidemia Father    Other Father        MVA   Hypertension Brother    Cancer Brother    Heart disease Brother    Cancer Paternal Grandmother        stomach   Diabetes Paternal Grandfather    Breast cancer Neg Hx     Allergies  Allergen Reactions   Crestor [Rosuvastatin] Other (See Comments)    myalgia   Niacin And Related Other (See Comments)    Whelps and skin flushed, mouth tingling    Medication list has been reviewed and updated.  Current Outpatient Medications on File Prior to Visit  Medication Sig Dispense Refill   albuterol (VENTOLIN HFA) 108 (90 Base) MCG/ACT inhaler Inhale 2 puffs into the lungs every 6 (six) hours as needed for wheezing or shortness of breath. 6.7 g 2   amLODipine (NORVASC) 10 MG tablet Take 1  tablet (10 mg total) by mouth at bedtime. 90 tablet 1   aspirin EC 81 MG EC tablet Take 1 tablet (81 mg total) by mouth daily. 30 tablet 1   benzonatate (TESSALON) 100 MG capsule Take 1 capsule (100 mg total) by mouth 2 (two) times daily as needed for cough. 20 capsule 0   chlorpheniramine (CHLOR-TRIMETON) 4 MG tablet Take 1 tablet (4 mg total) by mouth daily. (Patient taking differently: Take 4 mg by mouth daily as needed for allergies.) 14 tablet 3   cholecalciferol (VITAMIN D) 1000 UNITS tablet Take 1,000 Units by mouth daily.     clonazePAM (KLONOPIN) 0.5 MG tablet Take 0.5-1 tablets (0.25-0.5 mg total) by mouth 2 (two) times daily as needed for anxiety. 30 tablet 1   clopidogrel (PLAVIX) 75 MG tablet Take 1 tablet (75 mg total) by mouth daily. 90 tablet 3   denosumab (PROLIA) 60 MG/ML SOSY injection Inject 60 mg into the skin every 6 (six) months. Will get at office 05/03/22 [facility] 180 mL 0   ezetimibe (ZETIA) 10 MG tablet Take 1 tablet (10 mg total) by mouth daily. 90 tablet 3   famotidine (PEPCID) 20 MG tablet Take 1 tablet (20 mg total) by mouth daily. 90 tablet 3   fenofibrate (TRICOR) 145 MG tablet Take 1 tablet (145 mg) by mouth daily. 90 tablet 3   FLUoxetine (PROZAC) 20 MG capsule Take 1 capsule (20 mg total) by mouth daily. 90 capsule 1   fluticasone (FLONASE) 50 MCG/ACT nasal spray Place 1 spray into both nostrils daily as needed for allergies or rhinitis. 16 g 11   ibuprofen (ADVIL) 200 MG tablet Take 400 mg by mouth daily as needed for mild pain (knee pain).     icosapent Ethyl (VASCEPA) 1 g capsule Take 1 capsule (1 g total) by mouth 2 (two) times daily. 180 capsule 3   isosorbide mononitrate (IMDUR) 30 MG 24 hr tablet Take 1 tablet (30 mg total) by mouth daily. 90 tablet 2   Multiple Vitamin (MULTIVITAMIN WITH MINERALS) TABS tablet Take 1 tablet by  mouth daily.     nitroGLYCERIN (NITROSTAT) 0.4 MG SL tablet Place 1 tablet under the tongue every 5 minutes as needed for  chest pain. 25 tablet 2   RABEprazole (ACIPHEX) 20 MG tablet Take 1 tablet (20 mg total) by mouth daily 30 minutes prior to food 90 tablet 3   simvastatin (ZOCOR) 40 MG tablet Take 1 tablet (40 mg total) by mouth daily. 90 tablet 3   telmisartan (MICARDIS) 40 MG tablet Take 1 tablet (40 mg total) by mouth daily. 90 tablet 3   [DISCONTINUED] rosuvastatin (CRESTOR) 20 MG tablet Take 1 tablet (20 mg total) by mouth daily. 30 tablet 3   Current Facility-Administered Medications on File Prior to Visit  Medication Dose Route Frequency Provider Last Rate Last Admin   [START ON 10/10/2023] denosumab (PROLIA) injection 60 mg  60 mg Subcutaneous Q6 months Delta Pichon, [name], MD        Review of Systems:  As per HPI- otherwise negative.   Physical Examination: There were no vitals filed for this visit. There were no vitals filed for this visit. There is no height or weight on file to calculate BMI. Ideal Body Weight:    Spoke with pt on video -she sounds well, no distress noted  Assessment and Plan: Frequent falls  Heartburn - Plan: RABEprazole (ACIPHEX) 20 MG tablet  Essential hypertension - Plan: CBC, Comprehensive metabolic panel  Memory change - Plan: Urine Culture, Vitamin B12, Magnesium, DG Chest 2 View  DM (diabetes mellitus) with complications (HCC) - Plan: Hemoglobin A1c  Virtual visit, audio only today due to concern of frequent falls.  As above, patient did go through a period of time when she fell and would have spontaneous episodes of shaking a couple of years ago.  This got better on its own.  That is of exclusion at that time was anxiety.  This may be the case again it is certainly there could be something else going on.  I ordered labs and a chest x-ray which she will complete next week.  They are not able to come in today because the office is closed due to weather  If any is getting worse in the meantime they will let me know  Otherwise I will follow-up with them once I  have received results and plan the neck step  Patient also notes her GERD is bothering her again, refilled Aciphex to use as needed    Signed Abbe Amsterdam, MD

## 2023-05-15 ENCOUNTER — Other Ambulatory Visit: Payer: Self-pay

## 2023-05-19 ENCOUNTER — Ambulatory Visit (HOSPITAL_BASED_OUTPATIENT_CLINIC_OR_DEPARTMENT_OTHER)
Admission: RE | Admit: 2023-05-19 | Discharge: 2023-05-19 | Disposition: A | Discharge status: Home / Self Care | Payer: PPO | Source: Ambulatory Visit | Attending: Family Medicine | Admitting: Family Medicine

## 2023-05-19 ENCOUNTER — Other Ambulatory Visit (INDEPENDENT_AMBULATORY_CARE_PROVIDER_SITE_OTHER): Payer: PPO

## 2023-05-19 ENCOUNTER — Encounter: Payer: Self-pay | Admitting: Family Medicine

## 2023-05-19 ENCOUNTER — Encounter (HOSPITAL_BASED_OUTPATIENT_CLINIC_OR_DEPARTMENT_OTHER): Payer: Self-pay

## 2023-05-19 DIAGNOSIS — R413 Other amnesia: Secondary | ICD-10-CM | POA: Insufficient documentation

## 2023-05-19 DIAGNOSIS — I10 Essential (primary) hypertension: Secondary | ICD-10-CM

## 2023-05-19 DIAGNOSIS — Z1231 Encounter for screening mammogram for malignant neoplasm of breast: Secondary | ICD-10-CM

## 2023-05-19 DIAGNOSIS — E118 Type 2 diabetes mellitus with unspecified complications: Secondary | ICD-10-CM

## 2023-05-19 DIAGNOSIS — R410 Disorientation, unspecified: Secondary | ICD-10-CM | POA: Diagnosis not present

## 2023-05-19 DIAGNOSIS — I700 Atherosclerosis of aorta: Secondary | ICD-10-CM | POA: Diagnosis not present

## 2023-05-19 LAB — COMPREHENSIVE METABOLIC PANEL
ALT: 11 U/L (ref 0–35)
AST: 13 U/L (ref 0–37)
Albumin: 4.5 g/dL (ref 3.5–5.2)
Alkaline Phosphatase: 38 U/L — ABNORMAL LOW (ref 39–117)
BUN: 26 mg/dL — ABNORMAL HIGH (ref 6–23)
CO2: 27 meq/L (ref 19–32)
Calcium: 10 mg/dL (ref 8.4–10.5)
Chloride: 101 meq/L (ref 96–112)
Creatinine, Ser: 1.14 mg/dL (ref 0.40–1.20)
GFR: 47.74 mL/min — ABNORMAL LOW (ref >60.00)
Glucose, Bld: 167 mg/dL — ABNORMAL HIGH (ref 70–99)
Potassium: 4.7 meq/L (ref 3.5–5.1)
Sodium: 137 meq/L (ref 135–145)
Total Bilirubin: 0.4 mg/dL (ref 0.2–1.2)
Total Protein: 6.9 g/dL (ref 6.0–8.3)

## 2023-05-19 LAB — CBC
HCT: 37.1 % (ref 36.0–46.0)
Hemoglobin: 11.9 g/dL — ABNORMAL LOW (ref 12.0–15.0)
MCHC: 32.1 g/dL (ref 30.0–36.0)
MCV: 79.7 fL (ref 78.0–100.0)
Platelets: 393 10*3/uL (ref 150.0–400.0)
RBC: 4.65 Mil/uL (ref 3.87–5.11)
RDW: 14.9 % (ref 11.5–15.5)
WBC: 9.9 10*3/uL (ref 4.0–10.5)

## 2023-05-19 LAB — VITAMIN B12: Vitamin B-12: 338 pg/mL (ref 211–911)

## 2023-05-19 LAB — MAGNESIUM: Magnesium: 1.9 mg/dL (ref 1.5–2.5)

## 2023-05-19 LAB — HEMOGLOBIN A1C: Hgb A1c MFr Bld: 7.3 % — ABNORMAL HIGH (ref 4.6–6.5)

## 2023-05-20 ENCOUNTER — Ambulatory Visit (HOSPITAL_BASED_OUTPATIENT_CLINIC_OR_DEPARTMENT_OTHER): Payer: PPO

## 2023-05-20 LAB — URINE CULTURE
MICRO NUMBER:: 16119727
Result:: NO GROWTH
SPECIMEN QUALITY:: ADEQUATE

## 2023-05-21 ENCOUNTER — Encounter: Payer: Self-pay | Admitting: Family Medicine

## 2023-05-28 DIAGNOSIS — M25661 Stiffness of right knee, not elsewhere classified: Secondary | ICD-10-CM | POA: Diagnosis not present

## 2023-05-28 DIAGNOSIS — M25561 Pain in right knee: Secondary | ICD-10-CM | POA: Diagnosis not present

## 2023-06-03 DIAGNOSIS — M6281 Muscle weakness (generalized): Secondary | ICD-10-CM | POA: Diagnosis not present

## 2023-06-03 DIAGNOSIS — M2142 Flat foot [pes planus] (acquired), left foot: Secondary | ICD-10-CM | POA: Diagnosis not present

## 2023-06-03 DIAGNOSIS — M25662 Stiffness of left knee, not elsewhere classified: Secondary | ICD-10-CM | POA: Diagnosis not present

## 2023-06-03 DIAGNOSIS — M25562 Pain in left knee: Secondary | ICD-10-CM | POA: Diagnosis not present

## 2023-06-03 DIAGNOSIS — R2689 Other abnormalities of gait and mobility: Secondary | ICD-10-CM | POA: Diagnosis not present

## 2023-06-10 DIAGNOSIS — M25662 Stiffness of left knee, not elsewhere classified: Secondary | ICD-10-CM | POA: Diagnosis not present

## 2023-06-10 DIAGNOSIS — M6281 Muscle weakness (generalized): Secondary | ICD-10-CM | POA: Diagnosis not present

## 2023-06-10 DIAGNOSIS — M2142 Flat foot [pes planus] (acquired), left foot: Secondary | ICD-10-CM | POA: Diagnosis not present

## 2023-06-10 DIAGNOSIS — R2689 Other abnormalities of gait and mobility: Secondary | ICD-10-CM | POA: Diagnosis not present

## 2023-06-10 DIAGNOSIS — M25562 Pain in left knee: Secondary | ICD-10-CM | POA: Diagnosis not present

## 2023-06-12 DIAGNOSIS — M25662 Stiffness of left knee, not elsewhere classified: Secondary | ICD-10-CM | POA: Diagnosis not present

## 2023-06-12 DIAGNOSIS — M25562 Pain in left knee: Secondary | ICD-10-CM | POA: Diagnosis not present

## 2023-06-12 DIAGNOSIS — M2142 Flat foot [pes planus] (acquired), left foot: Secondary | ICD-10-CM | POA: Diagnosis not present

## 2023-06-12 DIAGNOSIS — R2689 Other abnormalities of gait and mobility: Secondary | ICD-10-CM | POA: Diagnosis not present

## 2023-06-12 DIAGNOSIS — M6281 Muscle weakness (generalized): Secondary | ICD-10-CM | POA: Diagnosis not present

## 2023-06-17 DIAGNOSIS — M6281 Muscle weakness (generalized): Secondary | ICD-10-CM | POA: Diagnosis not present

## 2023-06-17 DIAGNOSIS — M25562 Pain in left knee: Secondary | ICD-10-CM | POA: Diagnosis not present

## 2023-06-17 DIAGNOSIS — M2142 Flat foot [pes planus] (acquired), left foot: Secondary | ICD-10-CM | POA: Diagnosis not present

## 2023-06-17 DIAGNOSIS — M25662 Stiffness of left knee, not elsewhere classified: Secondary | ICD-10-CM | POA: Diagnosis not present

## 2023-06-17 DIAGNOSIS — R2689 Other abnormalities of gait and mobility: Secondary | ICD-10-CM | POA: Diagnosis not present

## 2023-06-19 DIAGNOSIS — R2689 Other abnormalities of gait and mobility: Secondary | ICD-10-CM | POA: Diagnosis not present

## 2023-06-19 DIAGNOSIS — M25562 Pain in left knee: Secondary | ICD-10-CM | POA: Diagnosis not present

## 2023-06-19 DIAGNOSIS — M25662 Stiffness of left knee, not elsewhere classified: Secondary | ICD-10-CM | POA: Diagnosis not present

## 2023-06-19 DIAGNOSIS — M6281 Muscle weakness (generalized): Secondary | ICD-10-CM | POA: Diagnosis not present

## 2023-06-19 DIAGNOSIS — M2142 Flat foot [pes planus] (acquired), left foot: Secondary | ICD-10-CM | POA: Diagnosis not present

## 2023-06-26 ENCOUNTER — Other Ambulatory Visit: Payer: Self-pay

## 2023-06-26 NOTE — Progress Notes (Signed)
 Disenrolling - Prolia last filled 8.5.24 and last dose was administered 2.18.25. Dose that was given 2.18.25 was supplied buy-bill as it was likely more affordable through patient's medical benefit.

## 2023-06-28 NOTE — Patient Instructions (Incomplete)
 It was great to see you again today Recommended a dose of RSV vaccine at your pharmacy Please complete the stool test at home- otherwise we can get labs in 3-4 months Keep up the good work with exercise- continue your PT program at home!

## 2023-06-28 NOTE — Progress Notes (Unsigned)
 [facility] at [facility] [address] [place], [place] [address] [phone]  Date:  07/02/2023   Name:  [name]   DOB:  1950-03-27   MRN:  [id number]  PCP:  Pearline Cables, MD    Chief Complaint: No chief complaint on file.   History of Present Illness:  [name] is a 73 y.o. very pleasant female patient who presents with the following:  Patient seen today for periodic follow-up Most recent visit with myself was in February for virtual visit after a couple of falls occurred at home.  We got some workup including labs and a chest film.  All was reassuring except for minimal anemia  History of hypertension, CAD, diabetes, sleep apnea, obesity, osteopenia with elevated fracture risk on fosamax Status post PCI 2010 and 2014  She was seen by neurology this past winter for episodes of tremulousness and memory loss The thinking is that her episodic tremors/aphasia and stuttering are likely due to anxiety and possibly a panic attack  Most recent visit with cardiology was in November: CAD with DES stent to prox. LAD in 2010 with residual disease in LCX 10-20% and RCA 20% lesion. She also has HTN, DM, dyslipidemia and obesity. Most recent cardiac cath 2022 showed Previously placed Prox LAD to Mid LAD stent (unknown type) is widely patent.   ?  Has she set up colonoscopy.  We did note minimal anemia earlier this year  Lab Results  Component Value Date   HGBA1C 7.3 (H) 05/19/2023   Due for foot exam update  Amlodipine 10 Aspirin 81 Plavix Prolia Zetia 10 Fenofibrate Vascepa Imdur Micardis 40 Fluoxetine  Patient Active Problem List   Diagnosis Date Noted   Primary osteoarthritis of right knee 08/30/2021   Cervical radiculopathy 08/16/2021   Shortness of breath 01/30/2021   Heartburn 01/30/2021   Chronic rhinitis 01/30/2021   Upper airway cough syndrome 11/09/2020   DOE (dyspnea on exertion) 11/09/2020   AKI  (acute kidney injury) (HCC) 10/30/2020   Obstructive sleep apnea treated with continuous positive airway pressure (CPAP) 05/07/2018   Chest pain 01/31/2018   Morbid obesity (HCC) 11/03/2017   Coronary artery disease involving native coronary artery of native heart with unstable angina pectoris (HCC) 11/03/2017   Insomnia 11/03/2017   Inadequate sleep hygiene 11/03/2017   Anxiety 07/21/2017   Dizziness 10/02/2016   Right patella fracture 08/29/2016   Acquired foot deformity, left 07/18/2016   Osteopenia 11/10/2015   HNP (herniated nucleus pulposus), lumbar 10/12/2014   Spinal stenosis at L4-L5 level 10/12/2014   Iron deficiency anemia 07/29/2014   DM (diabetes mellitus) with complications (HCC) 07/29/2014   Environmental and seasonal allergies 04/28/2014   Spinal stenosis, lumbar region, with neurogenic claudication 01/06/2013   Obesity, morbid, BMI 40.0-49.9 (HCC) 10/20/2012   Chest pain with moderate risk of acute coronary syndrome 10/02/2012   CAD S/P percutaneous coronary angioplasty    Hyperlipidemia with target LDL less than 70    Essential hypertension 04/16/2012    Past Medical History:  Diagnosis Date   Anemia    Arthritis    Bronchitis    hx of    CAD (coronary artery disease)    a. s/p DES to LAD in 2010 with patent stent by cath in 2014 b. low-risk NST in 11/2016   Cataracts, bilateral    Diabetes mellitus without complication (HCC)    Family history of adverse reaction to anesthesia  pts mother had difficulty awakening    GERD (gastroesophageal reflux disease)    Hyperlipidemia LDL goal < 70    Hypertension    Lumbar back pain    Numbness    left leg and foot    Plantar fascia rupture    Left Foot   Sleep apnea    uses CPAP     Past Surgical History:  Procedure Laterality Date   ABDOMINAL HYSTERECTOMY     BACK SURGERY     BREAST CYST ASPIRATION  1995   CAROTID STENT  2009   pt denies    CORONARY ANGIOPLASTY WITH STENT PLACEMENT  2010and 10-02-2012    Stent DES, Xience to prox. LAD   DOPPLER ECHOCARDIOGRAPHY  08/01/2009   EF=>55%,LV normal   LEFT HEART CATH AND CORONARY ANGIOGRAPHY N/A 12/10/2019   Procedure: LEFT HEART CATH AND CORONARY ANGIOGRAPHY;  Surgeon: Lyn Records, MD;  Location: [facility];  Service: Cardiovascular;  Laterality: N/A;   LEFT HEART CATH AND CORONARY ANGIOGRAPHY N/A 10/30/2020   Procedure: LEFT HEART CATH AND CORONARY ANGIOGRAPHY;  Surgeon: Runell Gess, MD;  Location: [facility];  Service: Cardiovascular;  Laterality: N/A;   LEFT HEART CATHETERIZATION WITH CORONARY ANGIOGRAM N/A 10/02/2012   Procedure: LEFT HEART CATHETERIZATION WITH CORONARY ANGIOGRAM;  Surgeon: Runell Gess, MD;  Location: [facility] CATH LAB;  Service: Cardiovascular;  Laterality: N/A;   lower arterial duplex  06/20/10   abi's normal,rgt 0.98,lft 1.06;bilateral PVRs normal   LUMBAR LAMINECTOMY/DECOMPRESSION MICRODISCECTOMY Left 01/06/2013   Procedure: MICRO LUMBAR DECOMPRESSION L4-5 AND L5-S1;  Surgeon: Javier Docker, MD;  Location: [facility];  Service: Orthopedics;  Laterality: Left;   LUMBAR LAMINECTOMY/DECOMPRESSION MICRODISCECTOMY Left 10/12/2014   Procedure: REVISION MICRO LUMBAR/DECOMPRESSION L4-5 LEFT ;  Surgeon: Jene Every, MD;  Location: [facility];  Service: Orthopedics;  Laterality: Left;   NM MYOCAR PERF WALL MOTION  09/22/2008   lexiscan-EF 83%; glogal LV systolic fx is norm. ,evidence of mild ischemia basal anterior,midanterior and apical lateral region(s).    ORIF PATELLA Right 08/29/2016   Procedure: OPEN REDUCTION INTERNAL (ORIF) FIXATION RIGHT PATELLA;  Surgeon: Samson Frederic, MD;  Location: [facility];  Service: Orthopedics;  Laterality: Right;  Adductor Block   TUBAL LIGATION     TYMPANOPLASTY Bilateral    UVULOPALATOPHARYNGOPLASTY     pt denies     Social History   Tobacco Use   Smoking status: Never   Smokeless tobacco: Never  Vaping Use   Vaping status: Never Used  Substance Use Topics   Alcohol use:  Yes    Comment: occasional, 1 a month wine   Drug use: No    Family History  Problem Relation Age of Onset   Coronary artery disease Mother    Rheum arthritis Mother    Dementia Mother    Heart attack Father    Hypertension Father    Hyperlipidemia Father    Other Father        MVA   Hypertension Brother    Cancer Brother    Heart disease Brother    Cancer Paternal Grandmother        stomach   Diabetes Paternal Grandfather    Breast cancer Neg Hx     Allergies  Allergen Reactions   Crestor [Rosuvastatin] Other (See Comments)    myalgia   Niacin And Related Other (See Comments)    Whelps and skin flushed, mouth tingling    Medication list has been reviewed and updated.  Current Outpatient Medications on File Prior to Visit  Medication Sig Dispense Refill   albuterol (VENTOLIN HFA) 108 (90 Base) MCG/ACT inhaler Inhale 2 puffs into the lungs every 6 (six) hours as needed for wheezing or shortness of breath. 6.7 g 2   amLODipine (NORVASC) 10 MG tablet Take 1 tablet (10 mg total) by mouth at bedtime. 90 tablet 1   aspirin EC 81 MG EC tablet Take 1 tablet (81 mg total) by mouth daily. 30 tablet 1   benzonatate (TESSALON) 100 MG capsule Take 1 capsule (100 mg total) by mouth 2 (two) times daily as needed for cough. 20 capsule 0   chlorpheniramine (CHLOR-TRIMETON) 4 MG tablet Take 1 tablet (4 mg total) by mouth daily. (Patient taking differently: Take 4 mg by mouth daily as needed for allergies.) 14 tablet 3   cholecalciferol (VITAMIN D) 1000 UNITS tablet Take 1,000 Units by mouth daily.     clonazePAM (KLONOPIN) 0.5 MG tablet Take 0.5-1 tablets (0.25-0.5 mg total) by mouth 2 (two) times daily as needed for anxiety. 30 tablet 1   clopidogrel (PLAVIX) 75 MG tablet Take 1 tablet (75 mg total) by mouth daily. 90 tablet 3   denosumab (PROLIA) 60 MG/ML SOSY injection Inject 60 mg into the skin every 6 (six) months. Will get at office 05/03/22 [facility] 180 mL 0   ezetimibe  (ZETIA) 10 MG tablet Take 1 tablet (10 mg total) by mouth daily. 90 tablet 3   famotidine (PEPCID) 20 MG tablet Take 1 tablet (20 mg total) by mouth daily. 90 tablet 3   fenofibrate (TRICOR) 145 MG tablet Take 1 tablet (145 mg) by mouth daily. 90 tablet 3   FLUoxetine (PROZAC) 20 MG capsule Take 1 capsule (20 mg total) by mouth daily. 90 capsule 1   fluticasone (FLONASE) 50 MCG/ACT nasal spray Place 1 spray into both nostrils daily as needed for allergies or rhinitis. 16 g 11   ibuprofen (ADVIL) 200 MG tablet Take 400 mg by mouth daily as needed for mild pain (knee pain).     icosapent Ethyl (VASCEPA) 1 g capsule Take 1 capsule (1 g total) by mouth 2 (two) times daily. 180 capsule 3   isosorbide mononitrate (IMDUR) 30 MG 24 hr tablet Take 1 tablet (30 mg total) by mouth daily. 90 tablet 2   Multiple Vitamin (MULTIVITAMIN WITH MINERALS) TABS tablet Take 1 tablet by mouth daily.     nitroGLYCERIN (NITROSTAT) 0.4 MG SL tablet Place 1 tablet under the tongue every 5 minutes as needed for chest pain. 25 tablet 2   RABEprazole (ACIPHEX) 20 MG tablet Take 1 tablet (20 mg total) by mouth daily 30 minutes prior to food 90 tablet 3   simvastatin (ZOCOR) 40 MG tablet Take 1 tablet (40 mg total) by mouth daily. 90 tablet 3   telmisartan (MICARDIS) 40 MG tablet Take 1 tablet (40 mg total) by mouth daily. 90 tablet 3   [DISCONTINUED] rosuvastatin (CRESTOR) 20 MG tablet Take 1 tablet (20 mg total) by mouth daily. 30 tablet 3   Current Facility-Administered Medications on File Prior to Visit  Medication Dose Route Frequency Provider Last Rate Last Admin   [START ON 10/10/2023] denosumab (PROLIA) injection 60 mg  60 mg Subcutaneous Q6 months Nabil Bubolz, [name], MD        Review of Systems:  ***  Physical Examination: There were no vitals filed for this visit. There were no vitals filed for this visit. There is no height or weight on  file to calculate BMI. Ideal Body Weight:    ***  Assessment and  Plan: ***  Signed Abbe Amsterdam, MD

## 2023-07-02 ENCOUNTER — Ambulatory Visit (INDEPENDENT_AMBULATORY_CARE_PROVIDER_SITE_OTHER): Payer: PPO | Admitting: Family Medicine

## 2023-07-02 ENCOUNTER — Other Ambulatory Visit (HOSPITAL_COMMUNITY): Payer: Self-pay

## 2023-07-02 ENCOUNTER — Other Ambulatory Visit: Payer: Self-pay

## 2023-07-02 VITALS — BP 134/72 | HR 65 | Temp 97.9°F | Resp 18 | Ht 61.0 in | Wt 195.0 lb

## 2023-07-02 DIAGNOSIS — Z9861 Coronary angioplasty status: Secondary | ICD-10-CM

## 2023-07-02 DIAGNOSIS — I10 Essential (primary) hypertension: Secondary | ICD-10-CM

## 2023-07-02 DIAGNOSIS — E785 Hyperlipidemia, unspecified: Secondary | ICD-10-CM | POA: Diagnosis not present

## 2023-07-02 DIAGNOSIS — F411 Generalized anxiety disorder: Secondary | ICD-10-CM

## 2023-07-02 DIAGNOSIS — D649 Anemia, unspecified: Secondary | ICD-10-CM

## 2023-07-02 DIAGNOSIS — I25118 Atherosclerotic heart disease of native coronary artery with other forms of angina pectoris: Secondary | ICD-10-CM

## 2023-07-02 DIAGNOSIS — I2510 Atherosclerotic heart disease of native coronary artery without angina pectoris: Secondary | ICD-10-CM

## 2023-07-02 MED ORDER — FLUOXETINE HCL 20 MG PO CAPS
20.0000 mg | ORAL_CAPSULE | Freq: Every day | ORAL | 3 refills | Status: AC
  Filled 2023-07-02: qty 90, 90d supply, fill #0
  Filled 2023-11-14: qty 90, 90d supply, fill #1
  Filled 2024-03-27: qty 90, 90d supply, fill #2

## 2023-07-02 MED ORDER — EZETIMIBE 10 MG PO TABS
10.0000 mg | ORAL_TABLET | Freq: Every day | ORAL | 3 refills | Status: AC
  Filled 2023-07-02: qty 90, 90d supply, fill #0
  Filled 2023-11-14: qty 90, 90d supply, fill #1
  Filled 2024-03-19 (×2): qty 90, 90d supply, fill #2

## 2023-07-02 MED ORDER — AMLODIPINE BESYLATE 10 MG PO TABS
10.0000 mg | ORAL_TABLET | Freq: Every day | ORAL | 3 refills | Status: AC
  Filled 2023-07-02: qty 90, 90d supply, fill #0
  Filled 2023-12-26: qty 90, 90d supply, fill #1
  Filled 2024-04-28: qty 30, 30d supply, fill #1

## 2023-07-02 MED ORDER — ISOSORBIDE MONONITRATE ER 30 MG PO TB24
30.0000 mg | ORAL_TABLET | Freq: Every day | ORAL | 3 refills | Status: AC
  Filled 2023-07-02: qty 90, 90d supply, fill #0
  Filled 2023-11-14: qty 90, 90d supply, fill #1
  Filled 2023-12-25 – 2024-03-19 (×4): qty 90, 90d supply, fill #2

## 2023-07-02 MED ORDER — CLOPIDOGREL BISULFATE 75 MG PO TABS
75.0000 mg | ORAL_TABLET | Freq: Every day | ORAL | 3 refills | Status: AC
  Filled 2023-07-02: qty 90, 90d supply, fill #0
  Filled 2023-11-14: qty 90, 90d supply, fill #1
  Filled 2024-04-28: qty 30, 30d supply, fill #2

## 2023-07-02 MED ORDER — SIMVASTATIN 40 MG PO TABS
40.0000 mg | ORAL_TABLET | Freq: Every day | ORAL | 3 refills | Status: AC
  Filled 2023-07-02: qty 90, 90d supply, fill #0
  Filled 2024-03-19 (×2): qty 90, 90d supply, fill #1

## 2023-07-02 MED ORDER — ICOSAPENT ETHYL 1 G PO CAPS
1.0000 g | ORAL_CAPSULE | Freq: Two times a day (BID) | ORAL | 3 refills | Status: DC
  Filled 2023-07-02: qty 120, 60d supply, fill #0
  Filled 2024-04-28: qty 30, 15d supply, fill #0

## 2023-07-02 MED ORDER — NITROGLYCERIN 0.4 MG SL SUBL
0.4000 mg | SUBLINGUAL_TABLET | SUBLINGUAL | 2 refills | Status: AC | PRN
  Filled 2023-07-02: qty 25, 1d supply, fill #0

## 2023-07-08 ENCOUNTER — Other Ambulatory Visit (HOSPITAL_COMMUNITY): Payer: Self-pay

## 2023-07-15 ENCOUNTER — Other Ambulatory Visit (HOSPITAL_COMMUNITY): Payer: Self-pay

## 2023-07-16 ENCOUNTER — Other Ambulatory Visit: Payer: Self-pay

## 2023-08-08 ENCOUNTER — Other Ambulatory Visit (HOSPITAL_COMMUNITY): Payer: Self-pay

## 2023-08-16 ENCOUNTER — Observation Stay (HOSPITAL_BASED_OUTPATIENT_CLINIC_OR_DEPARTMENT_OTHER)
Admission: EM | Admit: 2023-08-16 | Discharge: 2023-08-17 | Disposition: A | Discharge status: Home / Self Care | Attending: Internal Medicine | Admitting: Internal Medicine

## 2023-08-16 ENCOUNTER — Other Ambulatory Visit: Payer: Self-pay

## 2023-08-16 ENCOUNTER — Encounter (HOSPITAL_BASED_OUTPATIENT_CLINIC_OR_DEPARTMENT_OTHER): Payer: Self-pay | Admitting: Emergency Medicine

## 2023-08-16 ENCOUNTER — Emergency Department (HOSPITAL_BASED_OUTPATIENT_CLINIC_OR_DEPARTMENT_OTHER)

## 2023-08-16 DIAGNOSIS — Z7902 Long term (current) use of antithrombotics/antiplatelets: Secondary | ICD-10-CM | POA: Insufficient documentation

## 2023-08-16 DIAGNOSIS — E785 Hyperlipidemia, unspecified: Secondary | ICD-10-CM | POA: Diagnosis not present

## 2023-08-16 DIAGNOSIS — Z9861 Coronary angioplasty status: Secondary | ICD-10-CM | POA: Diagnosis not present

## 2023-08-16 DIAGNOSIS — R0602 Shortness of breath: Secondary | ICD-10-CM | POA: Diagnosis not present

## 2023-08-16 DIAGNOSIS — E66813 Obesity, class 3: Secondary | ICD-10-CM | POA: Diagnosis not present

## 2023-08-16 DIAGNOSIS — Z6835 Body mass index (BMI) 35.0-35.9, adult: Secondary | ICD-10-CM | POA: Diagnosis not present

## 2023-08-16 DIAGNOSIS — F419 Anxiety disorder, unspecified: Secondary | ICD-10-CM | POA: Diagnosis present

## 2023-08-16 DIAGNOSIS — J181 Lobar pneumonia, unspecified organism: Secondary | ICD-10-CM | POA: Diagnosis not present

## 2023-08-16 DIAGNOSIS — A419 Sepsis, unspecified organism: Secondary | ICD-10-CM | POA: Diagnosis not present

## 2023-08-16 DIAGNOSIS — R059 Cough, unspecified: Secondary | ICD-10-CM | POA: Insufficient documentation

## 2023-08-16 DIAGNOSIS — G4733 Obstructive sleep apnea (adult) (pediatric): Secondary | ICD-10-CM | POA: Diagnosis not present

## 2023-08-16 DIAGNOSIS — Z7982 Long term (current) use of aspirin: Secondary | ICD-10-CM | POA: Insufficient documentation

## 2023-08-16 DIAGNOSIS — E1169 Type 2 diabetes mellitus with other specified complication: Secondary | ICD-10-CM | POA: Diagnosis present

## 2023-08-16 DIAGNOSIS — Z79899 Other long term (current) drug therapy: Secondary | ICD-10-CM | POA: Diagnosis not present

## 2023-08-16 DIAGNOSIS — I2510 Atherosclerotic heart disease of native coronary artery without angina pectoris: Secondary | ICD-10-CM

## 2023-08-16 DIAGNOSIS — I152 Hypertension secondary to endocrine disorders: Secondary | ICD-10-CM | POA: Diagnosis present

## 2023-08-16 DIAGNOSIS — D500 Iron deficiency anemia secondary to blood loss (chronic): Secondary | ICD-10-CM | POA: Insufficient documentation

## 2023-08-16 DIAGNOSIS — E1159 Type 2 diabetes mellitus with other circulatory complications: Secondary | ICD-10-CM | POA: Diagnosis present

## 2023-08-16 DIAGNOSIS — E66812 Obesity, class 2: Secondary | ICD-10-CM | POA: Diagnosis present

## 2023-08-16 DIAGNOSIS — J189 Pneumonia, unspecified organism: Principal | ICD-10-CM

## 2023-08-16 DIAGNOSIS — D509 Iron deficiency anemia, unspecified: Secondary | ICD-10-CM | POA: Diagnosis present

## 2023-08-16 DIAGNOSIS — B9689 Other specified bacterial agents as the cause of diseases classified elsewhere: Secondary | ICD-10-CM

## 2023-08-16 DIAGNOSIS — I10 Essential (primary) hypertension: Secondary | ICD-10-CM | POA: Insufficient documentation

## 2023-08-16 DIAGNOSIS — R918 Other nonspecific abnormal finding of lung field: Secondary | ICD-10-CM | POA: Diagnosis not present

## 2023-08-16 DIAGNOSIS — D649 Anemia, unspecified: Secondary | ICD-10-CM | POA: Diagnosis present

## 2023-08-16 DIAGNOSIS — E119 Type 2 diabetes mellitus without complications: Secondary | ICD-10-CM

## 2023-08-16 DIAGNOSIS — J168 Pneumonia due to other specified infectious organisms: Secondary | ICD-10-CM | POA: Diagnosis not present

## 2023-08-16 LAB — CBC WITH DIFFERENTIAL/PLATELET
Abs Immature Granulocytes: 0.96 10*3/uL — ABNORMAL HIGH (ref 0.00–0.07)
Basophils Absolute: 0.1 10*3/uL (ref 0.0–0.1)
Basophils Relative: 1 %
Eosinophils Absolute: 0.4 10*3/uL (ref 0.0–0.5)
Eosinophils Relative: 2 %
HCT: 29.1 % — ABNORMAL LOW (ref 36.0–46.0)
Hemoglobin: 9.5 g/dL — ABNORMAL LOW (ref 12.0–15.0)
Immature Granulocytes: 5 %
Lymphocytes Relative: 20 %
Lymphs Abs: 3.8 10*3/uL (ref 0.7–4.0)
MCH: 25.2 pg — ABNORMAL LOW (ref 26.0–34.0)
MCHC: 32.6 g/dL (ref 30.0–36.0)
MCV: 77.2 fL — ABNORMAL LOW (ref 80.0–100.0)
Monocytes Absolute: 2.1 10*3/uL — ABNORMAL HIGH (ref 0.1–1.0)
Monocytes Relative: 11 %
Neutro Abs: 11.6 10*3/uL — ABNORMAL HIGH (ref 1.7–7.7)
Neutrophils Relative %: 61 %
Platelets: 493 10*3/uL — ABNORMAL HIGH (ref 150–400)
RBC: 3.77 MIL/uL — ABNORMAL LOW (ref 3.87–5.11)
RDW: 15.4 % (ref 11.5–15.5)
WBC: 18.9 10*3/uL — ABNORMAL HIGH (ref 4.0–10.5)
nRBC: 0 % (ref 0.0–0.2)

## 2023-08-16 LAB — URINALYSIS, W/ REFLEX TO CULTURE (INFECTION SUSPECTED)
Bilirubin Urine: NEGATIVE
Glucose, UA: 100 mg/dL — AB
Hgb urine dipstick: NEGATIVE
Ketones, ur: NEGATIVE mg/dL
Leukocytes,Ua: NEGATIVE
Nitrite: NEGATIVE
Protein, ur: 100 mg/dL — AB
RBC / HPF: NONE SEEN RBC/hpf (ref 0–5)
Specific Gravity, Urine: 1.025 (ref 1.005–1.030)
pH: 5.5 (ref 5.0–8.0)

## 2023-08-16 LAB — COMPREHENSIVE METABOLIC PANEL WITH GFR
ALT: 19 U/L (ref 0–44)
AST: 28 U/L (ref 15–41)
Albumin: 3.2 g/dL — ABNORMAL LOW (ref 3.5–5.0)
Alkaline Phosphatase: 105 U/L (ref 38–126)
Anion gap: 13 (ref 5–15)
BUN: 17 mg/dL (ref 8–23)
CO2: 23 mmol/L (ref 22–32)
Calcium: 9.5 mg/dL (ref 8.9–10.3)
Chloride: 101 mmol/L (ref 98–111)
Creatinine, Ser: 0.99 mg/dL (ref 0.44–1.00)
GFR, Estimated: 60 mL/min — ABNORMAL LOW (ref >60)
Glucose, Bld: 155 mg/dL — ABNORMAL HIGH (ref 70–99)
Potassium: 3.9 mmol/L (ref 3.5–5.1)
Sodium: 137 mmol/L (ref 135–145)
Total Bilirubin: 0.2 mg/dL (ref 0.0–1.2)
Total Protein: 6.6 g/dL (ref 6.5–8.1)

## 2023-08-16 LAB — RESP PANEL BY RT-PCR (RSV, FLU A&B, COVID)  RVPGX2
Influenza A by PCR: NEGATIVE
Influenza B by PCR: NEGATIVE
Resp Syncytial Virus by PCR: NEGATIVE
SARS Coronavirus 2 by RT PCR: NEGATIVE

## 2023-08-16 LAB — LACTIC ACID, PLASMA
Lactic Acid, Venous: 2.1 mmol/L (ref 0.5–1.9)
Lactic Acid, Venous: 2.9 mmol/L (ref 0.5–1.9)

## 2023-08-16 LAB — TROPONIN T, HIGH SENSITIVITY: Troponin T High Sensitivity: <15 ng/L (ref <19)

## 2023-08-16 MED ORDER — LACTATED RINGERS IV SOLN: INTRAVENOUS | Status: DC

## 2023-08-16 MED ORDER — LACTATED RINGERS IV BOLUS (SEPSIS)
1000.0000 mL | Freq: Once | INTRAVENOUS | Status: AC
  Administered 2023-08-16: 1000 mL via INTRAVENOUS

## 2023-08-16 MED ORDER — CLONAZEPAM 0.25 MG PO TBDP: 0.2500 mg | ORAL_TABLET | Freq: Once | ORAL | Status: DC

## 2023-08-16 MED ORDER — SODIUM CHLORIDE 0.9 % IV SOLN
2.0000 g | Freq: Once | INTRAVENOUS | Status: AC
  Administered 2023-08-16: 2 g via INTRAVENOUS
  Filled 2023-08-16: qty 20

## 2023-08-16 MED ORDER — IPRATROPIUM-ALBUTEROL 0.5-2.5 (3) MG/3ML IN SOLN
3.0000 mL | Freq: Once | RESPIRATORY_TRACT | Status: AC
  Administered 2023-08-16: 3 mL via RESPIRATORY_TRACT
  Filled 2023-08-16: qty 3

## 2023-08-16 MED ORDER — SODIUM CHLORIDE 0.9 % IV SOLN
500.0000 mg | Freq: Once | INTRAVENOUS | Status: AC
  Administered 2023-08-16: 500 mg via INTRAVENOUS
  Filled 2023-08-16: qty 5

## 2023-08-16 MED ORDER — LORAZEPAM 1 MG PO TABS
0.5000 mg | ORAL_TABLET | Freq: Once | ORAL | Status: AC
  Administered 2023-08-16: 0.5 mg via ORAL
  Filled 2023-08-16: qty 1

## 2023-08-16 MED ORDER — ACETAMINOPHEN 325 MG PO TABS
650.0000 mg | ORAL_TABLET | Freq: Once | ORAL | Status: AC
  Administered 2023-08-16: 650 mg via ORAL
  Filled 2023-08-16: qty 2

## 2023-08-16 NOTE — Progress Notes (Signed)
 Plan of Care Note for accepted transfer   Patient: [name] MRN: [id number]   DOA: 08/16/2023  Facility requesting transfer: [facility] Requesting Provider: Sonnie Dusky, PA-C Reason for transfer: Sepsis due to pneumonia Facility course: [name] is a 73 y.o. female with a history of diabetes mellitus, coronary artery disease with coronary arteriography and stent, and hypertension presents the ED today for shortness of breath.  Patient endorses shortness of breath and chest pain for the past week with associated body aches and cough.  Denies any known sick contact.  States that after the symptoms started prior to going on a road trip to the beach.  States that symptoms have persisted since and have not gotten any better.  Denies any abdominal pain, nausea, vomiting, or changes to bowel habits.  No pain or difficulty with urination.  Denies any improvement of symptoms despite OTC analgesics at home.  No additional complaints or concerns at this time.  Upon presentation to the emergency room, BP was 178/55 with otherwise normal vital signs except for pulse oximetry that was 90%- 91% on room air at 97% on 2 L of O2 by nasal cannula.  Later on respiratory rate was 20-25.  Labs revealed blood glucose of 155 and albumin 3.2 with otherwise unremarkable CMP.  CBC showed leukocytosis of 18.9 with neutrophilia, anemia and thrombocytosis.  Lactic acid was 2.1 and later 2.9.  UA came back with many bacteria with only 0-5 WBCs and 100 glucose with 100 protein.  Blood cultures were drawn.  2 view chest x-ray showed the following: 1. Patchy airspace disease in the right lower lung zone typical of pneumonia. Possible mild opacity at the left lung base. 2. Background peribronchial thickening.  The patient was given 0.5 mg p.o. Ativan , DuoNeb, 650 mg p.o. Tylenol ,3 L bolus of IV lactated Ringer , IV Rocephin and Zithromax .  Plan of care: The patient is accepted for admission to Telemetry unit, at [facility]..  She will be under the care and responsibility of the EDP until arrival to [facility].  Author: Virgene Griffin, MD 08/16/2023  Check [email or web address] for on-call coverage.  Nursing staff, Please call [facility] Admits & Consults System-Wide number on Amion as soon as patient's arrival, so appropriate admitting provider can evaluate the pt.

## 2023-08-16 NOTE — ED Notes (Addendum)
 [name]

## 2023-08-16 NOTE — ED Triage Notes (Signed)
 Pt reports not feeling well x 1 wk; c/o LT ear pain, cough, SHOB, chills, body aches

## 2023-08-16 NOTE — Sepsis Progress Note (Signed)
 Elink following for sepsis protocol.

## 2023-08-16 NOTE — ED Notes (Signed)
 ED TO INPATIENT HANDOFF REPORT  ED Nurse Name and Phone #: [phone]  S Name/Age/Gender [name] 73 y.o. female Room/Bed: MH04/MH04  Code Status   Code Status: Prior  Home/SNF/Other Home Patient oriented to: self, place, time, and situation Is this baseline? Yes   Triage Complete: Triage complete  Chief Complaint Sepsis due to pneumonia (HCC) [J18.9, A41.9]  Triage Note Pt reports not feeling well x 1 wk; c/o LT ear pain, cough, SHOB, chills, body aches   Allergies Allergies  Allergen Reactions   Crestor  [Rosuvastatin ] Other (See Comments)    myalgia   Niacin And Related Other (See Comments)    Whelps and skin flushed, mouth tingling    Level of Care/Admitting Diagnosis ED Disposition     ED Disposition  Admit   Condition  --   Comment  Hospital Area: [facility] [[id number]]  Level of Care: Telemetry Medical [104]  May admit patient to [facility] or [facility] if equivalent level of care is available:: Yes  Interfacility transfer: Yes  Covid Evaluation: Asymptomatic - no recent exposure (last 10 days) testing not required  Diagnosis: Sepsis due to pneumonia [facility]) [[id number]]  Admitting Physician: Virgene Griffin [[id number]]  Attending Physician: Virgene Griffin [[id number]]  Certification:: I certify this patient will need inpatient services for at least 2 midnights  Expected Medical Readiness: 08/18/2023          B Medical/Surgery History Past Medical History:  Diagnosis Date   Anemia    Arthritis    Bronchitis    hx of    CAD (coronary artery disease)    a. s/p DES to LAD in 2010 with patent stent by cath in 2014 b. low-risk NST in 11/2016   Cataracts, bilateral    Diabetes mellitus without complication (HCC)    Family history of adverse reaction to anesthesia    pts mother had difficulty awakening    GERD (gastroesophageal reflux disease)    Hyperlipidemia LDL goal < 70    Hypertension    Lumbar back pain    Numbness    left leg  and foot    Plantar fascia rupture    Left Foot   Sleep apnea    uses CPAP    Past Surgical History:  Procedure Laterality Date   ABDOMINAL HYSTERECTOMY     BACK SURGERY     BREAST CYST ASPIRATION  1995   CAROTID STENT  2009   pt denies    CORONARY ANGIOPLASTY WITH STENT PLACEMENT  2010and 10-02-2012   Stent DES, Xience to prox. LAD   DOPPLER ECHOCARDIOGRAPHY  08/01/2009   EF=>55%,LV normal   LEFT HEART CATH AND CORONARY ANGIOGRAPHY N/A 12/10/2019   Procedure: LEFT HEART CATH AND CORONARY ANGIOGRAPHY;  Surgeon: Arty Binning, MD;  Location: [facility] INVASIVE CV LAB;  Service: Cardiovascular;  Laterality: N/A;   LEFT HEART CATH AND CORONARY ANGIOGRAPHY N/A 10/30/2020   Procedure: LEFT HEART CATH AND CORONARY ANGIOGRAPHY;  Surgeon: Avanell Leigh, MD;  Location: [facility] INVASIVE CV LAB;  Service: Cardiovascular;  Laterality: N/A;   LEFT HEART CATHETERIZATION WITH CORONARY ANGIOGRAM N/A 10/02/2012   Procedure: LEFT HEART CATHETERIZATION WITH CORONARY ANGIOGRAM;  Surgeon: Avanell Leigh, MD;  Location: [facility] CATH LAB;  Service: Cardiovascular;  Laterality: N/A;   lower arterial duplex  06/20/10   abi's normal,rgt 0.98,lft 1.06;bilateral PVRs normal   LUMBAR LAMINECTOMY/DECOMPRESSION MICRODISCECTOMY Left 01/06/2013   Procedure: MICRO LUMBAR DECOMPRESSION L4-5 AND L5-S1;  Surgeon: Loel Ring,  MD;  Location: [facility] ORS;  Service: Orthopedics;  Laterality: Left;   LUMBAR LAMINECTOMY/DECOMPRESSION MICRODISCECTOMY Left 10/12/2014   Procedure: REVISION MICRO LUMBAR/DECOMPRESSION L4-5 LEFT ;  Surgeon: Orvan Blanch, MD;  Location: [facility] ORS;  Service: Orthopedics;  Laterality: Left;   NM MYOCAR PERF WALL MOTION  09/22/2008   lexiscan -EF 83%; glogal LV systolic fx is norm. ,evidence of mild ischemia basal anterior,midanterior and apical lateral region(s).    ORIF PATELLA Right 08/29/2016   Procedure: OPEN REDUCTION INTERNAL (ORIF) FIXATION RIGHT PATELLA;  Surgeon: Adonica Hoose, MD;  Location: [facility] ORS;  Service:  Orthopedics;  Laterality: Right;  Adductor Block   TUBAL LIGATION     TYMPANOPLASTY Bilateral    UVULOPALATOPHARYNGOPLASTY     pt denies      A IV Location/Drains/Wounds Patient Lines/Drains/Airways Status     Active Line/Drains/Airways     Name Placement date Placement time Site Days   Peripheral IV 08/16/23 20 G Right Antecubital 08/16/23  1655  Antecubital  less than 1   Peripheral IV 08/16/23 20 G Posterior;Right Hand 08/16/23  1855  Hand  less than 1            Intake/Output Last 24 hours  Intake/Output Summary (Last 24 hours) at 08/16/2023 2131 Last data filed at 08/16/2023 1934 Gross per 24 hour  Intake 97.24 ml  Output --  Net 97.24 ml    Labs/Imaging Results for orders placed or performed during the hospital encounter of 08/16/23 (from the past 48 hours)  Resp panel by RT-PCR (RSV, Flu A&B, Covid) Anterior Nasal Swab     Status: None   Collection Time: 08/16/23  3:42 PM   Specimen: Anterior Nasal Swab  Result Value Ref Range   SARS Coronavirus 2 by RT PCR NEGATIVE NEGATIVE    Comment: (NOTE) SARS-CoV-2 target nucleic acids are NOT DETECTED.  The SARS-CoV-2 RNA is generally detectable in upper respiratory specimens during the acute phase of infection. The lowest concentration of SARS-CoV-2 viral copies this assay can detect is 138 copies/mL. A negative result does not preclude SARS-Cov-2 infection and should not be used as the sole basis for treatment or other patient management decisions. A negative result may occur with  improper specimen collection/handling, submission of specimen other than nasopharyngeal swab, presence of viral mutation(s) within the areas targeted by this assay, and inadequate number of viral copies(<138 copies/mL). A negative result must be combined with clinical observations, patient history, and epidemiological information. The expected result is Negative.  Fact Sheet for Patients:   BloggerCourse.com  Fact Sheet for Healthcare Providers:  SeriousBroker.it  This test is no t yet approved or cleared by the [place]  FDA and  has been authorized for detection and/or diagnosis of SARS-CoV-2 by FDA under an Emergency Use Authorization (EUA). This EUA will remain  in effect (meaning this test can be used) for the duration of the COVID-19 declaration under Section 564(b)(1) of the Act, 21 U.S.C.section 360bbb-3(b)(1), unless the authorization is terminated  or revoked sooner.       Influenza A by PCR NEGATIVE NEGATIVE   Influenza B by PCR NEGATIVE NEGATIVE    Comment: (NOTE) The Xpert Xpress SARS-CoV-2/FLU/RSV plus assay is intended as an aid in the diagnosis of influenza from Nasopharyngeal swab specimens and should not be used as a sole basis for treatment. Nasal washings and aspirates are unacceptable for Xpert Xpress SARS-CoV-2/FLU/RSV testing.  Fact Sheet for Patients: BloggerCourse.com  Fact Sheet for Healthcare Providers: SeriousBroker.it  This test is not yet approved  or cleared by the [place]  FDA and has been authorized for detection and/or diagnosis of SARS-CoV-2 by FDA under an Emergency Use Authorization (EUA). This EUA will remain in effect (meaning this test can be used) for the duration of the COVID-19 declaration under Section 564(b)(1) of the Act, 21 U.S.C. section 360bbb-3(b)(1), unless the authorization is terminated or revoked.     Resp Syncytial Virus by PCR NEGATIVE NEGATIVE    Comment: (NOTE) Fact Sheet for Patients: BloggerCourse.com  Fact Sheet for Healthcare Providers: SeriousBroker.it  This test is not yet approved or cleared by the [place]  FDA and has been authorized for detection and/or diagnosis of SARS-CoV-2 by FDA under an Emergency Use Authorization (EUA).  This EUA will remain in effect (meaning this test can be used) for the duration of the COVID-19 declaration under Section 564(b)(1) of the Act, 21 U.S.C. section 360bbb-3(b)(1), unless the authorization is terminated or revoked.  Performed at [facility], [address] [place], [place] [address]   Comprehensive metabolic panel     Status: Abnormal   Collection Time: 08/16/23  5:02 PM  Result Value Ref Range   Sodium 137 135 - 145 mmol/L   Potassium 3.9 3.5 - 5.1 mmol/L   Chloride 101 98 - 111 mmol/L   CO2 23 22 - 32 mmol/L   Glucose, Bld 155 (H) 70 - 99 mg/dL    Comment: Glucose reference range applies only to samples taken after fasting for at least 8 hours.   BUN 17 8 - 23 mg/dL   Creatinine, Ser 6.04 0.44 - 1.00 mg/dL   Calcium  9.5 8.9 - 10.3 mg/dL   Total Protein 6.6 6.5 - 8.1 g/dL   Albumin 3.2 (L) 3.5 - 5.0 g/dL   AST 28 15 - 41 U/L   ALT 19 0 - 44 U/L   Alkaline Phosphatase 105 38 - 126 U/L   Total Bilirubin 0.2 0.0 - 1.2 mg/dL   GFR, Estimated 60 (L) >60 mL/min    Comment: (NOTE) Calculated using the CKD-EPI Creatinine Equation (2021)    Anion gap 13 5 - 15    Comment: Performed at [facility], [address] [place], [place] [address]  CBC with Differential     Status: Abnormal   Collection Time: 08/16/23  5:02 PM  Result Value Ref Range   WBC 18.9 (H) 4.0 - 10.5 K/uL   RBC 3.77 (L) 3.87 - 5.11 MIL/uL   Hemoglobin 9.5 (L) 12.0 - 15.0 g/dL   HCT 11.9 (L) 14.7 - 82.9 %   MCV 77.2 (L) 80.0 - 100.0 fL   MCH 25.2 (L) 26.0 - 34.0 pg   MCHC 32.6 30.0 - 36.0 g/dL   RDW 56.2 13.0 - 86.5 %   Platelets 493 (H) 150 - 400 K/uL   nRBC 0.0 0.0 - 0.2 %   Neutrophils Relative % 61 %   Neutro Abs 11.6 (H) 1.7 - 7.7 K/uL   Lymphocytes Relative 20 %   Lymphs Abs 3.8 0.7 - 4.0 K/uL   Monocytes Relative 11 %   Monocytes Absolute 2.1 (H) 0.1 - 1.0 K/uL   Eosinophils Relative 2 %   Eosinophils Absolute 0.4 0.0 - 0.5 K/uL   Basophils Relative 1 %    Basophils Absolute 0.1 0.0 - 0.1 K/uL   Immature Granulocytes 5 %   Abs Immature Granulocytes 0.96 (H) 0.00 - 0.07 K/uL    Comment: Performed at [facility], [address] [place], [place] [address]  Troponin T, High Sensitivity     Status: None   Collection Time: 08/16/23  5:02 PM  Result Value Ref Range   Troponin T High Sensitivity <15 <19 ng/L    Comment: (NOTE) Biotin concentrations > 1000 ng/mL falsely decrease TnT results.  Serial cardiac troponin measurements are suggested.  Refer to the Links section for chest pain algorithms and additional  guidance. Performed at [facility], [address] [place], [place] [address]   Lactic acid, plasma     Status: Abnormal   Collection Time: 08/16/23  5:56 PM  Result Value Ref Range   Lactic Acid, Venous 2.1 (HH) 0.5 - 1.9 mmol/L    Comment: Critical Value, Read Back and verified with Debroah Fanning, RN AT 1828 ON [id number] BY Jude Norton Performed at [facility], [address] [place], [place] [address]   Urinalysis, w/ Reflex to Culture (Infection Suspected) -Urine, Clean Catch     Status: Abnormal   Collection Time: 08/16/23  6:03 PM  Result Value Ref Range   Specimen Source URINE, CLEAN CATCH    Color, Urine YELLOW YELLOW   APPearance CLOUDY (A) CLEAR   Specific Gravity, Urine 1.025 1.005 - 1.030   pH 5.5 5.0 - 8.0   Glucose, UA 100 (A) NEGATIVE mg/dL   Hgb urine dipstick NEGATIVE NEGATIVE   Bilirubin Urine NEGATIVE NEGATIVE   Ketones, ur NEGATIVE NEGATIVE mg/dL   Protein, ur 841 (A) NEGATIVE mg/dL   Nitrite NEGATIVE NEGATIVE   Leukocytes,Ua NEGATIVE NEGATIVE   Squamous Epithelial / HPF 0-5 0 - 5 /HPF   WBC, UA 0-5 0 - 5 WBC/hpf    Comment: Reflex urine culture not performed if WBC <=10, OR if Squamous epithelial cells >5. If Squamous epithelial cells >5, suggest recollection.   RBC / HPF NONE SEEN 0 - 5 RBC/hpf   Bacteria, UA MANY (A) NONE SEEN    Comment: Performed at [facility], [address] [place], [place] [address]  Lactic acid, plasma     Status: Abnormal   Collection Time: 08/16/23  8:10 PM  Result Value Ref Range   Lactic Acid, Venous 2.9 (HH) 0.5 - 1.9 mmol/L    Comment: CRITICAL VALUE NOTED.  VALUE IS CONSISTENT WITH PREVIOUSLY REPORTED AND CALLED VALUE. Performed at [facility], [address], [place], [place] [address]    DG Chest 2 View Result Date: 08/16/2023 CLINICAL DATA:  Shortness of breath and cough. EXAM: CHEST - 2 VIEW COMPARISON:  05/19/2023 FINDINGS: Patchy airspace disease in the right lower lung zone typical of pneumonia. There may be mild opacity at the left lung base. Background peribronchial thickening. Stable heart size and mediastinal contours. No pneumothorax or significant pleural effusion. Stable osseous structures. IMPRESSION: 1. Patchy airspace disease in the right lower lung zone typical of pneumonia. Possible mild opacity at the left lung base. 2. Background peribronchial thickening. Electronically Signed   By: Chadwick Colonel M.D.   On: 08/16/2023 16:05    Pending Labs Unresulted Labs (From admission, onward)     Start     Ordered   08/16/23 1948  Expectorated Sputum Assessment w Gram Stain, Rflx to Resp Cult  Once,   URGENT        08/16/23 1947   08/16/23 1725  Blood culture (routine x 2)  BLOOD CULTURE X 2,   STAT      08/16/23 1724  Vitals/Pain Today's Vitals   08/16/23 1851 08/16/23 1942 08/16/23 1945 08/16/23 2115  BP:   (!) 135/103 (!) 141/66  Pulse:   78 71  Resp:   13 15  Temp:      TempSrc:      SpO2: 97%  97% 97%  Weight:      Height:      PainSc:  4       Isolation Precautions No active isolations  Medications Medications  lactated ringers  infusion ( Intravenous New Bag/Given 08/16/23 2009)  ipratropium-albuterol  (DUONEB) 0.5-2.5 (3) MG/3ML nebulizer solution 3 mL (3 mLs Nebulization Given 08/16/23 1643)  ipratropium-albuterol  (DUONEB) 0.5-2.5 (3) MG/3ML  nebulizer solution 3 mL (3 mLs Nebulization Given 08/16/23 1812)  acetaminophen  (TYLENOL ) tablet 650 mg (650 mg Oral Given 08/16/23 1839)  cefTRIAXone (ROCEPHIN) 2 g in sodium chloride  0.9 % 100 mL IVPB (0 g Intravenous Stopped 08/16/23 1934)  azithromycin  (ZITHROMAX ) 500 mg in sodium chloride  0.9 % 250 mL IVPB (500 mg Intravenous New Bag/Given 08/16/23 1945)  lactated ringers  bolus 1,000 mL (0 mLs Intravenous Stopped 08/16/23 2012)    And  lactated ringers  bolus 1,000 mL (0 mLs Intravenous Stopped 08/16/23 2012)    And  lactated ringers  bolus 1,000 mL (0 mLs Intravenous Stopped 08/16/23 2124)  LORazepam  (ATIVAN ) tablet 0.5 mg (0.5 mg Oral Given 08/16/23 1940)    Mobility walks     Focused Assessments Pulmonary Assessment Handoff:  Lung sounds: Bilateral Breath Sounds: Clear O2 Device: (S) Nasal Cannula O2 Flow Rate (L/min): (S) 2 L/min    R Recommendations: See Admitting Provider Note  Report given to:   Additional Notes:

## 2023-08-16 NOTE — ED Notes (Signed)
 Lactic acid 2.9. Primary RN and PA aware.

## 2023-08-16 NOTE — ED Provider Notes (Signed)
 [place] [facility] Provider Note   CSN: [id number] Arrival date & time: 08/16/23  1515     History  Chief Complaint  Patient presents with   Shortness of Breath    [name] is a 73 y.o. female with a history of diabetes mellitus, coronary artery disease with coronary arteriography and stent, and hypertension presents the ED today for shortness of breath.  Patient endorses shortness of breath and chest pain for the past week with associated body aches and cough.  Denies any known sick contact.  States that after the symptoms started prior to going on a road trip to the beach.  States that symptoms have persisted since and have not gotten any better.  Denies any abdominal pain, nausea, vomiting, or changes to bowel habits.  No pain or difficulty with urination.  Denies any improvement of symptoms despite OTC analgesics at home.  No additional complaints or concerns at this time.    Home Medications Prior to Admission medications   Medication Sig Start Date End Date Taking? Authorizing Provider  albuterol  (VENTOLIN  HFA) 108 (90 Base) MCG/ACT inhaler Inhale 2 puffs into the lungs every 6 (six) hours as needed for wheezing or shortness of breath. 02/28/23   Trenton Frock, PA-C  amLODipine  (NORVASC ) 10 MG tablet Take 1 tablet (10 mg total) by mouth at bedtime. 07/02/23   Copland, Skipper Dumas, MD  aspirin  EC 81 MG EC tablet Take 1 tablet (81 mg total) by mouth daily. 08/30/16   Swinteck, Polly Brink, MD  benzonatate  (TESSALON ) 100 MG capsule Take 1 capsule (100 mg total) by mouth 2 (two) times daily as needed for cough. 02/28/23   Trenton Frock, PA-C  chlorpheniramine  (CHLOR-TRIMETON ) 4 MG tablet Take 1 tablet (4 mg total) by mouth daily. Patient taking differently: Take 4 mg by mouth daily as needed for allergies. 09/21/21   Copland, Skipper Dumas, MD  cholecalciferol  (VITAMIN D ) 1000 UNITS tablet Take 1,000 Units by mouth daily.    [provider]   clonazePAM  (KLONOPIN ) 0.5 MG tablet Take 0.5-1 tablets (0.25-0.5 mg total) by mouth 2 (two) times daily as needed for anxiety. 05/07/23   Copland, Skipper Dumas, MD  clopidogrel  (PLAVIX ) 75 MG tablet Take 1 tablet (75 mg total) by mouth daily. 07/02/23 07/01/24  Copland, Skipper Dumas, MD  denosumab  (PROLIA ) 60 MG/ML SOSY injection Inject 60 mg into the skin every 6 (six) months. Will get at office 05/03/22 [facility] 04/15/22   Copland, Skipper Dumas, MD  ezetimibe  (ZETIA ) 10 MG tablet Take 1 tablet (10 mg total) by mouth daily. 07/02/23   Copland, Skipper Dumas, MD  famotidine  (PEPCID ) 20 MG tablet Take 1 tablet (20 mg total) by mouth daily. 03/11/22   Copland, Skipper Dumas, MD  fenofibrate  (TRICOR ) 145 MG tablet Take 1 tablet (145 mg) by mouth daily. 09/02/22   Hilty, Aviva Lemmings, MD  FLUoxetine  (PROZAC ) 20 MG capsule Take 1 capsule (20 mg total) by mouth daily. 07/02/23   Copland, Skipper Dumas, MD  fluticasone  (FLONASE ) 50 MCG/ACT nasal spray Place 1 spray into both nostrils daily as needed for allergies or rhinitis. 02/09/21   Copland, Jessica C, MD  ibuprofen (ADVIL) 200 MG tablet Take 400 mg by mouth daily as needed for mild pain (knee pain).    [provider]  icosapent  Ethyl (VASCEPA ) 1 g capsule Take 1 capsule (1 g total) by mouth 2 (two) times daily. 07/02/23   Copland, Skipper Dumas, MD  isosorbide  mononitrate (IMDUR ) 30  MG 24 hr tablet Take 1 tablet (30 mg total) by mouth daily. 07/02/23   Copland, Jessica C, MD  Multiple Vitamin (MULTIVITAMIN WITH MINERALS) TABS tablet Take 1 tablet by mouth daily.    [provider]  nitroGLYCERIN  (NITROSTAT ) 0.4 MG SL tablet Place 1 tablet under the tongue every 5 minutes as needed for chest pain. 07/02/23   Copland, Skipper Dumas, MD  RABEprazole  (ACIPHEX ) 20 MG tablet Take 1 tablet (20 mg total) by mouth daily 30 minutes prior to food 05/14/23   Copland, Skipper Dumas, MD  simvastatin  (ZOCOR ) 40 MG tablet Take 1 tablet (40 mg total) by mouth daily. 07/02/23   Copland, Skipper Dumas,  MD  telmisartan  (MICARDIS ) 40 MG tablet Take 1 tablet (40 mg total) by mouth daily. 04/14/23   Tania Familia, NP  rosuvastatin  (CRESTOR ) 20 MG tablet Take 1 tablet (20 mg total) by mouth daily. 12/29/19 02/27/20  Carie Charity, NP      Allergies    Crestor  Holly.[place] ] and Niacin and related    Review of Systems   Review of Systems  Respiratory:  Positive for shortness of breath.   All other systems reviewed and are negative.   Physical Exam Updated Vital Signs BP (!) 135/103   Pulse 78   Temp 98.4 F (36.9 C)   Resp 13   Ht 5\' 2"  (1.575 m)   Wt 83.5 kg   LMP  (LMP Unknown)   SpO2 97%   BMI 33.65 kg/m  Physical Exam Vitals and nursing note reviewed.  Constitutional:      Appearance: Normal appearance.  HENT:     Head: Normocephalic and atraumatic.     Mouth/Throat:     Mouth: Mucous membranes are moist.  Eyes:     Conjunctiva/sclera: Conjunctivae normal.     Pupils: Pupils are equal, round, and reactive to light.  Cardiovascular:     Rate and Rhythm: Normal rate and regular rhythm.     Pulses: Normal pulses.     Heart sounds: Normal heart sounds.  Pulmonary:     Effort: Pulmonary effort is normal.     Comments: Decreased breath sounds in bilateral lower lobes.  Patient unable to speak in full sentences on initial arrival.  Improved after 2 DuoNeb treatments.  O2 sat of 90% on room air with ambulation. Abdominal:     Palpations: Abdomen is soft.     Tenderness: There is no abdominal tenderness.  Musculoskeletal:        General: Normal range of motion.     Cervical back: Normal range of motion.  Skin:    General: Skin is warm and dry.     Findings: No rash.  Neurological:     General: No focal deficit present.     Mental Status: She is alert.  Psychiatric:        Mood and Affect: Mood normal.        Behavior: Behavior normal.    ED Results / Procedures / Treatments   Labs (all labs ordered are listed, but only abnormal results are  displayed) Labs Reviewed  COMPREHENSIVE METABOLIC PANEL WITH GFR - Abnormal; Notable for the following components:      Result Value   Glucose, Bld 155 (*)    Albumin 3.2 (*)    GFR, Estimated 60 (*)    All other components within normal limits  CBC WITH DIFFERENTIAL/PLATELET - Abnormal; Notable for the following components:   WBC 18.9 (*)    RBC  3.77 (*)    Hemoglobin 9.5 (*)    HCT 29.1 (*)    MCV 77.2 (*)    MCH 25.2 (*)    Platelets 493 (*)    Neutro Abs 11.6 (*)    Monocytes Absolute 2.1 (*)    Abs Immature Granulocytes 0.96 (*)    All other components within normal limits  LACTIC ACID, PLASMA - Abnormal; Notable for the following components:   Lactic Acid, Venous 2.1 (*)    All other components within normal limits  LACTIC ACID, PLASMA - Abnormal; Notable for the following components:   Lactic Acid, Venous 2.9 (*)    All other components within normal limits  URINALYSIS, W/ REFLEX TO CULTURE (INFECTION SUSPECTED) - Abnormal; Notable for the following components:   APPearance CLOUDY (*)    Glucose, UA 100 (*)    Protein, ur 100 (*)    Bacteria, UA MANY (*)    All other components within normal limits  RESP PANEL BY RT-PCR (RSV, FLU A&B, COVID)  RVPGX2  CULTURE, BLOOD (ROUTINE X 2)  CULTURE, BLOOD (ROUTINE X 2)  EXPECTORATED SPUTUM ASSESSMENT W GRAM STAIN, RFLX TO RESP C  TROPONIN T, HIGH SENSITIVITY    EKG None  Radiology DG Chest 2 View Result Date: 08/16/2023 CLINICAL DATA:  Shortness of breath and cough. EXAM: CHEST - 2 VIEW COMPARISON:  05/19/2023 FINDINGS: Patchy airspace disease in the right lower lung zone typical of pneumonia. There may be mild opacity at the left lung base. Background peribronchial thickening. Stable heart size and mediastinal contours. No pneumothorax or significant pleural effusion. Stable osseous structures. IMPRESSION: 1. Patchy airspace disease in the right lower lung zone typical of pneumonia. Possible mild opacity at the left lung  base. 2. Background peribronchial thickening. Electronically Signed   By: Chadwick Colonel M.D.   On: 08/16/2023 16:05    Procedures .Critical Care  Performed by: Sonnie Dusky, PA-C Authorized by: Sonnie Dusky, PA-C   Critical care provider statement:    Critical care time (minutes):  30   Critical care was necessary to treat or prevent imminent or life-threatening deterioration of the following conditions:  Sepsis   Critical care was time spent personally by me on the following activities:  Blood draw for specimens, development of treatment plan with patient or surrogate, discussions with consultants, evaluation of patient's response to treatment, examination of patient, obtaining history from patient or surrogate, review of old charts, re-evaluation of patient's condition, pulse oximetry, ordering and review of radiographic studies, ordering and review of laboratory studies and ordering and performing treatments and interventions     Medications Ordered in ED Medications  lactated ringers  infusion ( Intravenous New Bag/Given 08/16/23 2009)  ipratropium-albuterol  (DUONEB) 0.5-2.5 (3) MG/3ML nebulizer solution 3 mL (3 mLs Nebulization Given 08/16/23 1643)  ipratropium-albuterol  (DUONEB) 0.5-2.5 (3) MG/3ML nebulizer solution 3 mL (3 mLs Nebulization Given 08/16/23 1812)  acetaminophen  (TYLENOL ) tablet 650 mg (650 mg Oral Given 08/16/23 1839)  cefTRIAXone (ROCEPHIN) 2 g in sodium chloride  0.9 % 100 mL IVPB (0 g Intravenous Stopped 08/16/23 1934)  azithromycin  (ZITHROMAX ) 500 mg in sodium chloride  0.9 % 250 mL IVPB (500 mg Intravenous New Bag/Given 08/16/23 1945)  lactated ringers  bolus 1,000 mL (0 mLs Intravenous Stopped 08/16/23 2012)    And  lactated ringers  bolus 1,000 mL (0 mLs Intravenous Stopped 08/16/23 2012)    And  lactated ringers  bolus 1,000 mL (1,000 mLs Intravenous New Bag/Given 08/16/23 2031)  LORazepam  (ATIVAN ) tablet 0.5 mg (0.5 mg Oral  Given 08/16/23 1940)    ED Course/ Medical  Decision Making/ A&P                                 Medical Decision Making Amount and/or Complexity of Data Reviewed Labs: ordered. Radiology: ordered.  Risk OTC drugs. Prescription drug management.   This patient presents to the ED for concern of shortness of breath, this involves an extensive number of treatment options, and is a complaint that carries with it a high risk of complications and morbidity.   Differential diagnosis includes: Flu, COVID, RSV, pneumonia, bronchitis, sepsis, etc.   Comorbidities  See HPI above   Additional History  Additional history obtained from prior records   Lab Tests  I ordered and personally interpreted labs.  The pertinent results include:   Elevated white count 18.9 Negative respiratory panel CMP is reassuring Negative troponin UA shows many bacteria without leukocytes or nitrite Lactic acid 2.1 Blood cultures pending. Sputum culture pending.   Imaging Studies  I ordered imaging studies including CXR  I independently visualized and interpreted imaging which showed:  Patchy airspace disease in the right lower lung zone, typical pneumonia.  Possible mild opacity in the left lung base. Background peribronchial thickening. I agree with the radiologist interpretation   Consultations  I requested consultation with Dr. Achilles Holes with [facility],  and discussed lab and imaging findings as well as pertinent plan - they recommend: Admit to med telemetry at [facility].   Problem List / ED Course / Critical Interventions / Medication Management  Patient reports cough, shortness of breath, chest pains, body aches, and chills for the past week.  Shortness of breath has been worsening.  Patient is unable to speak in full sentences on initial evaluation.  There is decreased breath sounds in bilateral lower lobes.  DuoNeb treatment was given and patient felt better, able to speak in full sentences after. No fevers, nausea, vomiting.  She has tried  using inhaler at home without improvement of symptoms. On the monitor, patient remained tachypneic with respirations in the 20-25 range.  Oxygen remains around 90 to 94% on room air.  Oxygen was 90% on room air with ambulation.  Placed on 2 L nasal cannula with improvement.  Respirations dropped to 17 and oxygen saturation improved to 97%.  This is a new oxygen demand.  She is not on supplemental oxygen at home. Meets sepsis criteria with her tachypnea, lactic of 2.1, and elevated WBC of 18.9. I ordered medications including: Tylenol  for headache DuoNeb for shortness of breath Ceftriaxone and azithromycin  for pneumonia LR for sepsis Ativan  for anxiety Reevaluation of the patient after these medicines showed that the patient improved I have reviewed the patients home medicines and have made adjustments as needed   Social Determinants of Health  Access to healthcare   Test / Admission - Considered  Discussed findings with patient.  All questions answered. She is agreeable with plan for admission.       Final Clinical Impression(s) / ED Diagnoses Final diagnoses:  Pneumonia of right lower lobe due to infectious organism  Sepsis, due to unspecified organism, unspecified whether acute organ dysfunction present [facility])    Rx / DC Orders ED Discharge Orders     None         Sonnie Dusky, PA-C 08/16/23 2120    Rolinda Climes, DO 08/16/23 2348

## 2023-08-16 NOTE — ED Notes (Signed)
 Pt cannot produce sputum sample at this time

## 2023-08-16 NOTE — ED Notes (Signed)
 Patient ambulated to restroom. Some SOB noted. SAT 90% upon arrival back to room. Neb treatment being given

## 2023-08-16 NOTE — H&P (Signed)
 History and Physical    APPOLLONIA KLEE ZOX:[id number] DOB: May 03, 1950 DOA: 08/16/2023  PCP: Kaylee Partridge, MD  Patient coming from: Home  I have personally briefly reviewed patient's old medical records in [facility]  Chief Complaint: Shortness of breath, cough  HPI: [name] is a 73 y.o. female with medical history significant for CAD s/p DES to LAD 2010, T2DM, HTN, HLD, anemia, GAD, OSA on CPAP who presented to the ED for evaluation of shortness of breath, chills, cough.  Patient states that she has been having shortness of breath, chest congestion and cough productive of yellow/green sputum, chills, and bodyaches for about 1 week.  She says she had a recent trip to [facility] that her symptoms started prior to going to the beach.  She says today she developed new watery and mucus containing diarrhea.  She has not had abdominal pain, nausea, vomiting.  She has been trying Mucinex  at home but symptoms have been worsening therefore she went to the ED for further evaluation.  [facility] ED Course  Labs/Imaging on admission: I have personally reviewed following labs and imaging studies.  Initial vitals showed BP 178/55, pulse 82, RR 19, temp 98.3 F, SpO2 94% on room air.  While in the ED patient was tachypneic with RR up to 35 and transiently hypotensive with BP 86/62 responsive to IV fluid resuscitation.  Labs showed WBC 18.9, hemoglobin 9.5, platelets 493, sodium 137, potassium 3.9, bicarb 23, BUN 17, creatinine 0.99, serum glucose 155, LFTs within normal limits, lactic acid 2.1 > 2.9.  Troponin T <15.  SARS-CoV-2, influenza, RSV PCR negative.  Blood cultures in process.  UA showed negative nitrites, negative leukocytes, 0-5 WBCs, no RBCs, many bacteria.  2 view chest x-ray showed patchy airspace disease in the right lower lung zone typical of pneumonia.  Mild opacity at the left lung base.  Background peribronchial thickening noted.  Patient was given 3 L LR, IV  ceftriaxone and azithromycin , DuoNeb treatment.  The hospitalist service was consulted to admit.  Review of Systems: All systems reviewed and are negative except as documented in history of present illness above.   Past Medical History:  Diagnosis Date   Anemia    Arthritis    Bronchitis    hx of    CAD (coronary artery disease)    a. s/p DES to LAD in 2010 with patent stent by cath in 2014 b. low-risk NST in 11/2016   Cataracts, bilateral    Diabetes mellitus without complication (HCC)    Family history of adverse reaction to anesthesia    pts mother had difficulty awakening    GERD (gastroesophageal reflux disease)    Hyperlipidemia LDL goal < 70    Hypertension    Lumbar back pain    Numbness    left leg and foot    Plantar fascia rupture    Left Foot   Sleep apnea    uses CPAP     Past Surgical History:  Procedure Laterality Date   ABDOMINAL HYSTERECTOMY     BACK SURGERY     BREAST CYST ASPIRATION  1995   CAROTID STENT  2009   pt denies    CORONARY ANGIOPLASTY WITH STENT PLACEMENT  2010and 10-02-2012   Stent DES, Xience to prox. LAD   DOPPLER ECHOCARDIOGRAPHY  08/01/2009   EF=>55%,LV normal   LEFT HEART CATH AND CORONARY ANGIOGRAPHY N/A 12/10/2019   Procedure: LEFT HEART CATH AND CORONARY ANGIOGRAPHY;  Surgeon:  Arty Binning, MD;  Location: [facility] INVASIVE CV LAB;  Service: Cardiovascular;  Laterality: N/A;   LEFT HEART CATH AND CORONARY ANGIOGRAPHY N/A 10/30/2020   Procedure: LEFT HEART CATH AND CORONARY ANGIOGRAPHY;  Surgeon: Avanell Leigh, MD;  Location: [facility] INVASIVE CV LAB;  Service: Cardiovascular;  Laterality: N/A;   LEFT HEART CATHETERIZATION WITH CORONARY ANGIOGRAM N/A 10/02/2012   Procedure: LEFT HEART CATHETERIZATION WITH CORONARY ANGIOGRAM;  Surgeon: Avanell Leigh, MD;  Location: [facility] CATH LAB;  Service: Cardiovascular;  Laterality: N/A;   lower arterial duplex  06/20/10   abi's normal,rgt 0.98,lft 1.06;bilateral PVRs normal   LUMBAR  LAMINECTOMY/DECOMPRESSION MICRODISCECTOMY Left 01/06/2013   Procedure: MICRO LUMBAR DECOMPRESSION L4-5 AND L5-S1;  Surgeon: Loel Ring, MD;  Location: [facility] ORS;  Service: Orthopedics;  Laterality: Left;   LUMBAR LAMINECTOMY/DECOMPRESSION MICRODISCECTOMY Left 10/12/2014   Procedure: REVISION MICRO LUMBAR/DECOMPRESSION L4-5 LEFT ;  Surgeon: Orvan Blanch, MD;  Location: [facility] ORS;  Service: Orthopedics;  Laterality: Left;   NM MYOCAR PERF WALL MOTION  09/22/2008   lexiscan -EF 83%; glogal LV systolic fx is norm. ,evidence of mild ischemia basal anterior,midanterior and apical lateral region(s).    ORIF PATELLA Right 08/29/2016   Procedure: OPEN REDUCTION INTERNAL (ORIF) FIXATION RIGHT PATELLA;  Surgeon: Adonica Hoose, MD;  Location: [facility] ORS;  Service: Orthopedics;  Laterality: Right;  Adductor Block   TUBAL LIGATION     TYMPANOPLASTY Bilateral    UVULOPALATOPHARYNGOPLASTY     pt denies     Social History: Social History   Tobacco Use   Smoking status: Never   Smokeless tobacco: Never  Vaping Use   Vaping status: Never Used  Substance Use Topics   Alcohol use: Yes    Comment: occasional, 1 a month wine   Drug use: No   Allergies  Allergen Reactions   Crestor  [Rosuvastatin ] Other (See Comments)    myalgia   Niacin And Related Other (See Comments)    Whelps and skin flushed, mouth tingling    Family History  Problem Relation Age of Onset   Coronary artery disease Mother    Rheum arthritis Mother    Dementia Mother    Heart attack Father    Hypertension Father    Hyperlipidemia Father    Other Father        MVA   Hypertension Brother    Cancer Brother    Heart disease Brother    Cancer Paternal Grandmother        stomach   Diabetes Paternal Grandfather    Breast cancer Neg Hx      Prior to Admission medications   Medication Sig Start Date End Date Taking? Authorizing Provider  albuterol  (VENTOLIN  HFA) 108 (90 Base) MCG/ACT inhaler Inhale 2 puffs into the lungs every 6  (six) hours as needed for wheezing or shortness of breath. 02/28/23   Trenton Frock, PA-C  amLODipine  (NORVASC ) 10 MG tablet Take 1 tablet (10 mg total) by mouth at bedtime. 07/02/23   Copland, Skipper Dumas, MD  aspirin  EC 81 MG EC tablet Take 1 tablet (81 mg total) by mouth daily. 08/30/16   Swinteck, Polly Brink, MD  benzonatate  (TESSALON ) 100 MG capsule Take 1 capsule (100 mg total) by mouth 2 (two) times daily as needed for cough. 02/28/23   Trenton Frock, PA-C  chlorpheniramine  (CHLOR-TRIMETON ) 4 MG tablet Take 1 tablet (4 mg total) by mouth daily. Patient taking differently: Take 4 mg by mouth daily as needed for allergies. 09/21/21   Copland, Skipper Dumas, MD  cholecalciferol  (VITAMIN D ) 1000 UNITS tablet Take 1,000 Units by mouth daily.    [provider]  clonazePAM  (KLONOPIN ) 0.5 MG tablet Take 0.5-1 tablets (0.25-0.5 mg total) by mouth 2 (two) times daily as needed for anxiety. 05/07/23   Copland, Skipper Dumas, MD  clopidogrel  (PLAVIX ) 75 MG tablet Take 1 tablet (75 mg total) by mouth daily. 07/02/23 07/01/24  Copland, Skipper Dumas, MD  denosumab  (PROLIA ) 60 MG/ML SOSY injection Inject 60 mg into the skin every 6 (six) months. Will get at office 05/03/22 [facility] 04/15/22   Copland, Skipper Dumas, MD  ezetimibe  (ZETIA ) 10 MG tablet Take 1 tablet (10 mg total) by mouth daily. 07/02/23   Copland, Skipper Dumas, MD  famotidine  (PEPCID ) 20 MG tablet Take 1 tablet (20 mg total) by mouth daily. 03/11/22   Copland, Skipper Dumas, MD  fenofibrate  (TRICOR ) 145 MG tablet Take 1 tablet (145 mg) by mouth daily. 09/02/22   Hilty, Aviva Lemmings, MD  FLUoxetine  (PROZAC ) 20 MG capsule Take 1 capsule (20 mg total) by mouth daily. 07/02/23   Copland, Skipper Dumas, MD  fluticasone  (FLONASE ) 50 MCG/ACT nasal spray Place 1 spray into both nostrils daily as needed for allergies or rhinitis. 02/09/21   Copland, Jessica C, MD  ibuprofen (ADVIL) 200 MG tablet Take 400 mg by mouth daily as needed for mild pain (knee pain).    [provider]   icosapent  Ethyl (VASCEPA ) 1 g capsule Take 1 capsule (1 g total) by mouth 2 (two) times daily. 07/02/23   Copland, Skipper Dumas, MD  isosorbide  mononitrate (IMDUR ) 30 MG 24 hr tablet Take 1 tablet (30 mg total) by mouth daily. 07/02/23   Copland, Jessica C, MD  Multiple Vitamin (MULTIVITAMIN WITH MINERALS) TABS tablet Take 1 tablet by mouth daily.    [provider]  nitroGLYCERIN  (NITROSTAT ) 0.4 MG SL tablet Place 1 tablet under the tongue every 5 minutes as needed for chest pain. 07/02/23   Copland, Skipper Dumas, MD  RABEprazole  (ACIPHEX ) 20 MG tablet Take 1 tablet (20 mg total) by mouth daily 30 minutes prior to food 05/14/23   Copland, Skipper Dumas, MD  simvastatin  (ZOCOR ) 40 MG tablet Take 1 tablet (40 mg total) by mouth daily. 07/02/23   Copland, Skipper Dumas, MD  telmisartan  (MICARDIS ) 40 MG tablet Take 1 tablet (40 mg total) by mouth daily. 04/14/23   Tania Familia, NP  rosuvastatin  (CRESTOR ) 20 MG tablet Take 1 tablet (20 mg total) by mouth daily. 12/29/19 02/27/20  Carie Charity, NP    Physical Exam: Vitals:   08/16/23 2115 08/16/23 2145 08/16/23 2223 08/16/23 2308  BP: (!) 141/66 134/86  124/60  Pulse: 71 67  65  Resp: 15 (!) 28  20  Temp:   98.3 F (36.8 C)   TempSrc:   Oral   SpO2: 97% 96%  95%  Weight:      Height:       Constitutional: Resting in bed, NAD, calm, comfortable Eyes: EOMI, lids and conjunctivae normal ENMT: Mucous membranes are moist. Posterior pharynx clear of any exudate or lesions.Normal dentition.  Neck: normal, supple, no masses. Respiratory: Faint inspiratory crackles right lower lung field. Normal respiratory effort. No accessory muscle use.  Cardiovascular: Regular rate and rhythm, no murmurs / rubs / gallops. No extremity edema. 2+ pedal pulses. Abdomen: no tenderness, no masses palpated. Musculoskeletal: no clubbing / cyanosis. No joint deformity upper and lower extremities. Good ROM, no contractures. Normal muscle tone.  Skin: no rashes, lesions,  ulcers. No induration Neurologic: Sensation intact. Strength 5/5 in all 4.  Psychiatric: Normal judgment and insight. Alert and oriented x 3. Normal mood.   EKG: Personally reviewed. Sinus rhythm, rate 84, RBBB, no acute ischemic changes.  Rate slightly faster otherwise similar to previous.  Assessment/Plan Principal Problem:   Sepsis due to pneumonia [facility]) Active Problems:   Acute on chronic anemia   CAD S/P percutaneous coronary angioplasty   Type 2 diabetes mellitus (HCC)   Hypertension associated with diabetes (HCC)   Hyperlipidemia associated with type 2 diabetes mellitus (HCC)   Anxiety   Obstructive sleep apnea treated with continuous positive airway pressure (CPAP)   [name] is a 73 y.o. female with medical history significant for CAD s/p DES to LAD 2010, T2DM, HTN, HLD, anemia, GAD, OSA on CPAP who is admitted with sepsis due to right lower lobe pneumonia.  Assessment and Plan: Sepsis due to community-acquired pneumonia of right lower lobe: Patient presented with leukocytosis, tachypnea, lactic acidosis.  CXR consistent with right lower lobe pneumonia.  COVID, influenza, RSV PCR negative. - Continue IV ceftriaxone and azithromycin  - Follow blood cultures, check strep pneumonia and Legionella urinary antigens - Supplemental oxygen as needed - Continue IV fluid hydration overnight - Recheck lactic acid level - Sinus problems, flutter valve, Mucinex   Acute on chronic anemia: Hemoglobin 9.5 compared to baseline ~11.5.  Patient denies obvious bleeding.  Continue to monitor.  CAD s/p DES to LAD 2010: Stable.  Continue aspirin , Plavix , Zetia , simvastatin .  Imdur  on hold for now.  Type 2 diabetes: Placed on SSI.  Hypertension: Holding home antihypertensives given transient hypotension while in the ED.  Resume as warranted.  Hyperlipidemia: Continue Zetia  and simvastatin .  Anxiety: Continue Prozac  and Klonopin  0.5 mg twice daily as needed.  OSA: Continue CPAP  nightly.   DVT prophylaxis: enoxaparin  (LOVENOX ) injection 40 mg Start: 08/17/23 2200 Code Status: Full code, confirmed with patient admission Family Communication: Daughter at bedside Disposition Plan: From home and likely discharge to home pending clinical progress Consults called: None Severity of Illness: The appropriate patient status for this patient is INPATIENT. Inpatient status is judged to be reasonable and necessary in order to provide the required intensity of service to ensure the patient's safety. The patient's presenting symptoms, physical exam findings, and initial radiographic and laboratory data in the context of their chronic comorbidities is felt to place them at high risk for further clinical deterioration. Furthermore, it is not anticipated that the patient will be medically stable for discharge from the hospital within 2 midnights of admission.   * I certify that at the point of admission it is my clinical judgment that the patient will require inpatient hospital care spanning beyond 2 midnights from the point of admission due to high intensity of service, high risk for further deterioration and high frequency of surveillance required.Edith Gores MD [facility]  If 7PM-7AM, please contact night-coverage [email or web address]  08/17/2023, 12:43 AM

## 2023-08-16 NOTE — Progress Notes (Signed)
 Paged [facility] admit, pt arrived from [facility]

## 2023-08-17 DIAGNOSIS — Z9861 Coronary angioplasty status: Secondary | ICD-10-CM

## 2023-08-17 DIAGNOSIS — I2510 Atherosclerotic heart disease of native coronary artery without angina pectoris: Secondary | ICD-10-CM | POA: Diagnosis not present

## 2023-08-17 DIAGNOSIS — E66812 Obesity, class 2: Secondary | ICD-10-CM

## 2023-08-17 DIAGNOSIS — E785 Hyperlipidemia, unspecified: Secondary | ICD-10-CM | POA: Diagnosis not present

## 2023-08-17 DIAGNOSIS — D649 Anemia, unspecified: Secondary | ICD-10-CM | POA: Diagnosis not present

## 2023-08-17 DIAGNOSIS — A419 Sepsis, unspecified organism: Secondary | ICD-10-CM | POA: Diagnosis not present

## 2023-08-17 DIAGNOSIS — E1169 Type 2 diabetes mellitus with other specified complication: Secondary | ICD-10-CM | POA: Diagnosis not present

## 2023-08-17 DIAGNOSIS — J189 Pneumonia, unspecified organism: Secondary | ICD-10-CM | POA: Diagnosis not present

## 2023-08-17 LAB — GLUCOSE, CAPILLARY
Glucose-Capillary: 130 mg/dL — ABNORMAL HIGH (ref 70–99)
Glucose-Capillary: 137 mg/dL — ABNORMAL HIGH (ref 70–99)

## 2023-08-17 LAB — BASIC METABOLIC PANEL WITH GFR
Anion gap: 7 (ref 5–15)
BUN: 16 mg/dL (ref 8–23)
CO2: 23 mmol/L (ref 22–32)
Calcium: 8.9 mg/dL (ref 8.9–10.3)
Chloride: 106 mmol/L (ref 98–111)
Creatinine, Ser: 0.87 mg/dL (ref 0.44–1.00)
GFR, Estimated: >60 mL/min (ref >60)
Glucose, Bld: 129 mg/dL — ABNORMAL HIGH (ref 70–99)
Potassium: 3.9 mmol/L (ref 3.5–5.1)
Sodium: 136 mmol/L (ref 135–145)

## 2023-08-17 LAB — HEMOGLOBIN A1C
Hgb A1c MFr Bld: 7.6 % — ABNORMAL HIGH (ref 4.8–5.6)
Mean Plasma Glucose: 171.42 mg/dL

## 2023-08-17 LAB — CBC
HCT: 26.7 % — ABNORMAL LOW (ref 36.0–46.0)
Hemoglobin: 8.5 g/dL — ABNORMAL LOW (ref 12.0–15.0)
MCH: 25.2 pg — ABNORMAL LOW (ref 26.0–34.0)
MCHC: 31.8 g/dL (ref 30.0–36.0)
MCV: 79.2 fL — ABNORMAL LOW (ref 80.0–100.0)
Platelets: 442 10*3/uL — ABNORMAL HIGH (ref 150–400)
RBC: 3.37 MIL/uL — ABNORMAL LOW (ref 3.87–5.11)
RDW: 15.4 % (ref 11.5–15.5)
WBC: 16.4 10*3/uL — ABNORMAL HIGH (ref 4.0–10.5)
nRBC: 0 % (ref 0.0–0.2)

## 2023-08-17 LAB — LACTIC ACID, PLASMA: Lactic Acid, Venous: 1.2 mmol/L (ref 0.5–1.9)

## 2023-08-17 LAB — IRON AND TIBC
Iron: 17 ug/dL — ABNORMAL LOW (ref 28–170)
Saturation Ratios: 7 % — ABNORMAL LOW (ref 10.4–31.8)
TIBC: 258 ug/dL (ref 250–450)
UIBC: 241 ug/dL

## 2023-08-17 LAB — STREP PNEUMONIAE URINARY ANTIGEN: Strep Pneumo Urinary Antigen: NEGATIVE

## 2023-08-17 MED ORDER — SENNOSIDES-DOCUSATE SODIUM 8.6-50 MG PO TABS: 1.0000 | ORAL_TABLET | Freq: Every evening | ORAL | Status: DC | PRN

## 2023-08-17 MED ORDER — EZETIMIBE 10 MG PO TABS
10.0000 mg | ORAL_TABLET | Freq: Every day | ORAL | Status: DC
  Administered 2023-08-17: 10 mg via ORAL
  Filled 2023-08-17: qty 1

## 2023-08-17 MED ORDER — SIMVASTATIN 40 MG PO TABS
40.0000 mg | ORAL_TABLET | Freq: Every day | ORAL | Status: DC
  Administered 2023-08-17: 40 mg via ORAL
  Filled 2023-08-17: qty 1

## 2023-08-17 MED ORDER — ACETAMINOPHEN 650 MG RE SUPP: 650.0000 mg | Freq: Four times a day (QID) | RECTAL | Status: DC | PRN

## 2023-08-17 MED ORDER — ONDANSETRON HCL 4 MG/2ML IJ SOLN: 4.0000 mg | Freq: Four times a day (QID) | INTRAMUSCULAR | Status: DC | PRN

## 2023-08-17 MED ORDER — ORAL CARE MOUTH RINSE: 15.0000 mL | OROMUCOSAL | Status: DC | PRN

## 2023-08-17 MED ORDER — ENOXAPARIN SODIUM 40 MG/0.4ML IJ SOSY: 40.0000 mg | PREFILLED_SYRINGE | INTRAMUSCULAR | Status: DC

## 2023-08-17 MED ORDER — CLONAZEPAM 0.5 MG PO TABS
0.5000 mg | ORAL_TABLET | Freq: Two times a day (BID) | ORAL | Status: DC | PRN
  Administered 2023-08-17: 0.5 mg via ORAL
  Filled 2023-08-17: qty 1

## 2023-08-17 MED ORDER — ASPIRIN 81 MG PO TBEC
81.0000 mg | DELAYED_RELEASE_TABLET | Freq: Every day | ORAL | Status: DC
  Administered 2023-08-17: 81 mg via ORAL
  Filled 2023-08-17: qty 1

## 2023-08-17 MED ORDER — ALBUTEROL SULFATE HFA 108 (90 BASE) MCG/ACT IN AERS: 2.0000 | INHALATION_SPRAY | Freq: Four times a day (QID) | RESPIRATORY_TRACT | 0 refills | Status: AC | PRN

## 2023-08-17 MED ORDER — INSULIN ASPART 100 UNIT/ML IJ SOLN: 0.0000 [IU] | Freq: Every day | INTRAMUSCULAR | Status: DC

## 2023-08-17 MED ORDER — SODIUM CHLORIDE 0.9% FLUSH
3.0000 mL | Freq: Two times a day (BID) | INTRAVENOUS | Status: DC
  Administered 2023-08-17: 3 mL via INTRAVENOUS

## 2023-08-17 MED ORDER — SODIUM CHLORIDE 0.9 % IV SOLN: 500.0000 mg | INTRAVENOUS | Status: DC

## 2023-08-17 MED ORDER — CEFPODOXIME PROXETIL 200 MG PO TABS: 200.0000 mg | ORAL_TABLET | Freq: Two times a day (BID) | ORAL | 0 refills | Status: AC

## 2023-08-17 MED ORDER — FLUOXETINE HCL 20 MG PO CAPS
20.0000 mg | ORAL_CAPSULE | Freq: Every day | ORAL | Status: DC
  Administered 2023-08-17: 20 mg via ORAL
  Filled 2023-08-17: qty 1

## 2023-08-17 MED ORDER — INSULIN ASPART 100 UNIT/ML IJ SOLN
0.0000 [IU] | Freq: Three times a day (TID) | INTRAMUSCULAR | Status: DC
  Administered 2023-08-17: 1 [IU] via SUBCUTANEOUS

## 2023-08-17 MED ORDER — SODIUM CHLORIDE 0.9 % IV SOLN: 2.0000 g | INTRAVENOUS | Status: DC

## 2023-08-17 MED ORDER — GUAIFENESIN ER 600 MG PO TB12
600.0000 mg | ORAL_TABLET | Freq: Two times a day (BID) | ORAL | Status: DC
  Administered 2023-08-17 (×2): 600 mg via ORAL
  Filled 2023-08-17 (×2): qty 1

## 2023-08-17 MED ORDER — ONDANSETRON HCL 4 MG PO TABS: 4.0000 mg | ORAL_TABLET | Freq: Four times a day (QID) | ORAL | Status: DC | PRN

## 2023-08-17 MED ORDER — AZITHROMYCIN 500 MG PO TABS: 500.0000 mg | ORAL_TABLET | Freq: Every day | ORAL | 0 refills | Status: AC

## 2023-08-17 MED ORDER — CLOPIDOGREL BISULFATE 75 MG PO TABS
75.0000 mg | ORAL_TABLET | Freq: Every day | ORAL | Status: DC
  Administered 2023-08-17: 75 mg via ORAL
  Filled 2023-08-17: qty 1

## 2023-08-17 MED ORDER — ACETAMINOPHEN 325 MG PO TABS: 650.0000 mg | ORAL_TABLET | Freq: Four times a day (QID) | ORAL | Status: DC | PRN

## 2023-08-17 NOTE — Assessment & Plan Note (Signed)
 08-17-2023 stable

## 2023-08-17 NOTE — Hospital Course (Addendum)
 HPI: [name] is a 73 y.o. female with medical history significant for CAD s/p DES to LAD 2010, T2DM, HTN, HLD, anemia, GAD, OSA on CPAP who presented to the ED for evaluation of shortness of breath, chills, cough.   Patient states that she has been having shortness of breath, chest congestion and cough productive of yellow/green sputum, chills, and bodyaches for about 1 week.  She says she had a recent trip to [facility] that her symptoms started prior to going to the beach.  She says today she developed new watery and mucus containing diarrhea.  She has not had abdominal pain, nausea, vomiting.  She has been trying Mucinex  at home but symptoms have been worsening therefore she went to the ED for further evaluation.   [facility] Course  Labs/Imaging on admission: I have personally reviewed following labs and imaging studies.   Initial vitals showed BP 178/55, pulse 82, RR 19, temp 98.3 F, SpO2 94% on room air.  While in the ED patient was tachypneic with RR up to 35 and transiently hypotensive with BP 86/62 responsive to IV fluid resuscitation.  Patient was given 3 L LR, IV ceftriaxone and azithromycin , DuoNeb treatment.  The hospitalist service was consulted to admit.    Significant Events: Admitted 08/16/2023 for RLL pneumonia   Admission Labs: WBC 18.9, HgB 9.5, plt 493 Na 137, K 3.9, CO2 of 23, BUN 17, Scr 0.99. glu 155 Lactic acid 2.1 UA spg 1.026, nitrite negative, LE negative, bacteria many  Admission Imaging Studies: CXR Patchy airspace disease in the right lower lung zone typical of pneumonia. Possible mild opacity at the left lung base. 2. Background peribronchial thickening  Significant Labs: Strep pneumo antigen negative  Significant Imaging Studies:   Antibiotic Therapy: Anti-infectives (From admission, onward)    Start     Dose/Rate Route Frequency Ordered Stop   08/17/23 1800  cefTRIAXone (ROCEPHIN) 2 g in sodium chloride  0.9 % 100 mL IVPB         2 g 200 mL/hr over 30 Minutes Intravenous Every 24 hours 08/17/23 0001 08/22/23 1759   08/17/23 1800  azithromycin  (ZITHROMAX ) 500 mg in sodium chloride  0.9 % 250 mL IVPB        500 mg 250 mL/hr over 60 Minutes Intravenous Every 24 hours 08/17/23 0001 08/22/23 1759   08/16/23 1845  cefTRIAXone (ROCEPHIN) 2 g in sodium chloride  0.9 % 100 mL IVPB        2 g 200 mL/hr over 30 Minutes Intravenous Once 08/16/23 1835 08/16/23 1934   08/16/23 1845  azithromycin  (ZITHROMAX ) 500 mg in sodium chloride  0.9 % 250 mL IVPB        500 mg 250 mL/hr over 60 Minutes Intravenous  Once 08/16/23 1835 08/16/23 2143       Procedures:   Consultants:

## 2023-08-17 NOTE — Assessment & Plan Note (Signed)
 08-17-2023 Body mass index is 33.65 kg/m.

## 2023-08-17 NOTE — Plan of Care (Signed)
  Problem: Clinical Measurements: Goal: Ability to maintain clinical measurements within normal limits will improve Outcome: Progressing Goal: Diagnostic test results will improve Outcome: Progressing Goal: Respiratory complications will improve Outcome: Progressing Goal: Cardiovascular complication will be avoided Outcome: Progressing   Problem: Metabolic: Goal: Ability to maintain appropriate glucose levels will improve Outcome: Progressing

## 2023-08-17 NOTE — Care Management Obs Status (Signed)
 MEDICARE OBSERVATION STATUS NOTIFICATION   Patient Details  Name: [name] MRN: [id number] Date of Birth: [birth year]   Medicare Observation Status Notification Given:  Yes    Levie Ream, RN 08/17/2023, 11:41 AM

## 2023-08-17 NOTE — Assessment & Plan Note (Signed)
 08-17-2023 iron studies sent. PCP to f/u on results.

## 2023-08-17 NOTE — Progress Notes (Signed)
 Patient is unable to tolerate our Cpap mask. Someone will bring her mask from home

## 2023-08-17 NOTE — Progress Notes (Signed)
 PROGRESS NOTE    [name]  ZOX:[id number] DOB: 1950/03/27 DOA: 08/16/2023 PCP: Kaylee Partridge, MD  Subjective: Pt seen and examined. Met with pt and husband at bedside. Pt has been ill for at least 1 week. They were at the beach when she first started feeling ill. Pt had 1 night of confusion. Husband wanted to call 911 but pt refused to go be evaluated.  Admitted last night with pneumonia and sepsis. Now weaned to RA. Afebrile. Pt requesting to go home.  Pt able to walk with me 400 feet in the hallway today without any assistive device. RA sats at end of walking 400 feet was 93%.  Pt requesting to go home.   Hospital Course: HPI: [name] is a 74 y.o. female with medical history significant for CAD s/p DES to LAD 2010, T2DM, HTN, HLD, anemia, GAD, OSA on CPAP who presented to the ED for evaluation of shortness of breath, chills, cough.   Patient states that she has been having shortness of breath, chest congestion and cough productive of yellow/green sputum, chills, and bodyaches for about 1 week.  She says she had a recent trip to [facility] that her symptoms started prior to going to the beach.  She says today she developed new watery and mucus containing diarrhea.  She has not had abdominal pain, nausea, vomiting.  She has been trying Mucinex  at home but symptoms have been worsening therefore she went to the ED for further evaluation.   [facility] ED Course  Labs/Imaging on admission: I have personally reviewed following labs and imaging studies.   Initial vitals showed BP 178/55, pulse 82, RR 19, temp 98.3 F, SpO2 94% on room air.  While in the ED patient was tachypneic with RR up to 35 and transiently hypotensive with BP 86/62 responsive to IV fluid resuscitation.  Patient was given 3 L LR, IV ceftriaxone and azithromycin , DuoNeb treatment.  The hospitalist service was consulted to admit.    Significant Events: Admitted 08/16/2023 for RLL  pneumonia   Admission Labs: WBC 18.9, HgB 9.5, plt 493 Na 137, K 3.9, CO2 of 23, BUN 17, Scr 0.99. glu 155 Lactic acid 2.1 UA spg 1.026, nitrite negative, LE negative, bacteria many  Admission Imaging Studies: CXR Patchy airspace disease in the right lower lung zone typical of pneumonia. Possible mild opacity at the left lung base. 2. Background peribronchial thickening  Significant Labs: Strep pneumo antigen negative  Significant Imaging Studies:   Antibiotic Therapy: Anti-infectives (From admission, onward)    Start     Dose/Rate Route Frequency Ordered Stop   08/17/23 1800  cefTRIAXone (ROCEPHIN) 2 g in sodium chloride  0.9 % 100 mL IVPB        2 g 200 mL/hr over 30 Minutes Intravenous Every 24 hours 08/17/23 0001 08/22/23 1759   08/17/23 1800  azithromycin  (ZITHROMAX ) 500 mg in sodium chloride  0.9 % 250 mL IVPB        500 mg 250 mL/hr over 60 Minutes Intravenous Every 24 hours 08/17/23 0001 08/22/23 1759   08/16/23 1845  cefTRIAXone (ROCEPHIN) 2 g in sodium chloride  0.9 % 100 mL IVPB        2 g 200 mL/hr over 30 Minutes Intravenous Once 08/16/23 1835 08/16/23 1934   08/16/23 1845  azithromycin  (ZITHROMAX ) 500 mg in sodium chloride  0.9 % 250 mL IVPB        500 mg 250 mL/hr over 60 Minutes Intravenous  Once 08/16/23 1835  08/16/23 2143       Procedures:   Consultants:     Assessment and Plan: * Sepsis due to pneumonia (HCC) 08-17-2023 met sepsis criteria on admission with elevated lactic acid, WBC 18, CXR that showed pneumonia.  WBC down to 16. Pt weaned to RA. Pt able to walk >400 with me today without any assistance. RA O2 sats at end of walk were 93%. DC to home with vantin/zithromax . Prn albuterol  inhaler. F/u with PCP in 2 weeks for repeat CBC and f/u chest XR. She is stable for Dc. Pt also requesting to go home. No contraindications for discharge.    Acute on chronic anemia 08-17-2023 PCP to f/u on iron studies. Refer to GI as needed for additional workup. DC  HgB 8.5 g/dl.    Obstructive sleep apnea treated with continuous positive airway pressure (CPAP) 08-17-2023 stable  Anxiety 08-17-2023 stable  Type 2 diabetes mellitus (HCC) 08-17-2023 stable. A1C 7.6%  Iron deficiency anemia 08-17-2023 iron studies sent. PCP to f/u on results.  Obesity, Class II, BMI 35-39.9 08-17-2023 Body mass index is 33.65 kg/m.   Hyperlipidemia associated with type 2 diabetes mellitus (HCC) 08-17-2023 stable  CAD S/P percutaneous coronary angioplasty 08-17-2023 stable  Hypertension associated with diabetes (HCC) 08-17-2023 stable  DVT prophylaxis: enoxaparin  (LOVENOX ) injection 40 mg Start: 08/17/23 2200    Code Status: Full Code Family Communication: discussed with pt and husband bobby at bedside Disposition Plan: return home Reason for continuing need for hospitalization: stable for DC today.  Objective: Vitals:   08/16/23 2308 08/16/23 2330 08/17/23 0332 08/17/23 0725  BP: 124/60  (!) 145/59 (!) 169/60  Pulse: 65  66 66  Resp: 20 16  20   Temp: (P) 98.5 F (36.9 C)  98.6 F (37 C) 98.2 F (36.8 C)  TempSrc: (P) Oral  Oral Oral  SpO2: 95%  94% 96%  Weight:      Height:        Intake/Output Summary (Last 24 hours) at 08/17/2023 1107 Last data filed at 08/17/2023 0600 Gross per 24 hour  Intake 1973.64 ml  Output 200 ml  Net 1773.64 ml   Filed Weights   08/16/23 1529  Weight: 83.5 kg    Examination:  Physical Exam Vitals and nursing note reviewed.  Constitutional:      General: She is not in acute distress.    Appearance: She is obese. She is not toxic-appearing or diaphoretic.  HENT:     Head: Normocephalic and atraumatic.     Nose: Nose normal.  Eyes:     General: No scleral icterus. Cardiovascular:     Rate and Rhythm: Normal rate and regular rhythm.  Pulmonary:     Effort: Pulmonary effort is normal.     Breath sounds: Normal breath sounds.     Comments: Right basilar rales. Initially had mild wheeze in right  base prior to walking 400 feet. After walking 400 feet, no further wheezing. Abdominal:     General: Bowel sounds are normal. There is no distension.     Palpations: Abdomen is soft.  Musculoskeletal:     Right lower leg: No edema.     Left lower leg: No edema.  Skin:    General: Skin is warm and dry.     Capillary Refill: Capillary refill takes less than 2 seconds.  Neurological:     General: No focal deficit present.     Mental Status: She is alert and oriented to person, place, and time.  Data Reviewed: I have personally reviewed following labs and imaging studies  CBC: Recent Labs  Lab 08/16/23 1702 08/17/23 0011  WBC 18.9* 16.4*  NEUTROABS 11.6*  --   HGB 9.5* 8.5*  HCT 29.1* 26.7*  MCV 77.2* 79.2*  PLT 493* 442*   Basic Metabolic Panel: Recent Labs  Lab 08/16/23 1702 08/17/23 0011  NA 137 136  K 3.9 3.9  CL 101 106  CO2 23 23  GLUCOSE 155* 129*  BUN 17 16  CREATININE 0.99 0.87  CALCIUM  9.5 8.9   GFR: Estimated Creatinine Clearance: 57.7 mL/min (by C-G formula based on SCr of 0.87 mg/dL). Liver Function Tests: Recent Labs  Lab 08/16/23 1702  AST 28  ALT 19  ALKPHOS 105  BILITOT 0.2  PROT 6.6  ALBUMIN 3.2*   HbA1C: Recent Labs    08/17/23 0011  HGBA1C 7.6*   CBG: Recent Labs  Lab 08/17/23 0033 08/17/23 0754  GLUCAP 130* 137*   Sepsis Labs: Recent Labs  Lab 08/16/23 1756 08/16/23 2010 08/17/23 0011  LATICACIDVEN 2.1* 2.9* 1.2    Recent Results (from the past 240 hours)  Resp panel by RT-PCR (RSV, Flu A&B, Covid) Anterior Nasal Swab     Status: None   Collection Time: 08/16/23  3:42 PM   Specimen: Anterior Nasal Swab  Result Value Ref Range Status   SARS Coronavirus 2 by RT PCR NEGATIVE NEGATIVE Final    Comment: (NOTE) SARS-CoV-2 target nucleic acids are NOT DETECTED.  The SARS-CoV-2 RNA is generally detectable in upper respiratory specimens during the acute phase of infection. The lowest concentration of SARS-CoV-2 viral  copies this assay can detect is 138 copies/mL. A negative result does not preclude SARS-Cov-2 infection and should not be used as the sole basis for treatment or other patient management decisions. A negative result may occur with  improper specimen collection/handling, submission of specimen other than nasopharyngeal swab, presence of viral mutation(s) within the areas targeted by this assay, and inadequate number of viral copies(<138 copies/mL). A negative result must be combined with clinical observations, patient history, and epidemiological information. The expected result is Negative.  Fact Sheet for Patients:  BloggerCourse.com  Fact Sheet for Healthcare Providers:  SeriousBroker.it  This test is no t yet approved or cleared by the [place]  FDA and  has been authorized for detection and/or diagnosis of SARS-CoV-2 by FDA under an Emergency Use Authorization (EUA). This EUA will remain  in effect (meaning this test can be used) for the duration of the COVID-19 declaration under Section 564(b)(1) of the Act, 21 U.S.C.section 360bbb-3(b)(1), unless the authorization is terminated  or revoked sooner.       Influenza A by PCR NEGATIVE NEGATIVE Final   Influenza B by PCR NEGATIVE NEGATIVE Final    Comment: (NOTE) The Xpert Xpress SARS-CoV-2/FLU/RSV plus assay is intended as an aid in the diagnosis of influenza from Nasopharyngeal swab specimens and should not be used as a sole basis for treatment. Nasal washings and aspirates are unacceptable for Xpert Xpress SARS-CoV-2/FLU/RSV testing.  Fact Sheet for Patients: BloggerCourse.com  Fact Sheet for Healthcare Providers: SeriousBroker.it  This test is not yet approved or cleared by the [place]  FDA and has been authorized for detection and/or diagnosis of SARS-CoV-2 by FDA under an Emergency Use Authorization (EUA). This  EUA will remain in effect (meaning this test can be used) for the duration of the COVID-19 declaration under Section 564(b)(1) of the Act, 21 U.S.C. section 360bbb-3(b)(1), unless the authorization is terminated  or revoked.     Resp Syncytial Virus by PCR NEGATIVE NEGATIVE Final    Comment: (NOTE) Fact Sheet for Patients: BloggerCourse.com  Fact Sheet for Healthcare Providers: SeriousBroker.it  This test is not yet approved or cleared by the [place]  FDA and has been authorized for detection and/or diagnosis of SARS-CoV-2 by FDA under an Emergency Use Authorization (EUA). This EUA will remain in effect (meaning this test can be used) for the duration of the COVID-19 declaration under Section 564(b)(1) of the Act, 21 U.S.C. section 360bbb-3(b)(1), unless the authorization is terminated or revoked.  Performed at [facility], [address] [place], [place] [address]   Blood culture (routine x 2)     Status: None (Preliminary result)   Collection Time: 08/16/23  5:51 PM   Specimen: BLOOD  Result Value Ref Range Status   Specimen Description   Final    BLOOD RIGHT ANTECUBITAL Performed at [facility], [address] [place], [place] [address]    Special Requests   Final    BOTTLES DRAWN AEROBIC AND ANAEROBIC Blood Culture results may not be optimal due to an inadequate volume of blood received in culture bottles Performed at [facility], [address] [place], [place] [address]    Culture   Final    NO GROWTH < 12 HOURS Performed at [facility], [address], [place], [place] [address]    Report Status PENDING  Incomplete  Blood culture (routine x 2)     Status: None (Preliminary result)   Collection Time: 08/16/23  7:00 PM   Specimen: BLOOD RIGHT HAND  Result Value Ref Range Status   Specimen Description   Final    BLOOD RIGHT HAND Performed at [facility],  [address] [place], [place] [address]    Special Requests   Final    BOTTLES DRAWN AEROBIC AND ANAEROBIC Blood Culture adequate volume Performed at [facility], [address] [place], [place] [address]    Culture   Final    NO GROWTH < 12 HOURS Performed at [facility], [address], [place], [place] [address]    Report Status PENDING  Incomplete     Radiology Studies: DG Chest 2 View Result Date: 08/16/2023 CLINICAL DATA:  Shortness of breath and cough. EXAM: CHEST - 2 VIEW COMPARISON:  05/19/2023 FINDINGS: Patchy airspace disease in the right lower lung zone typical of pneumonia. There may be mild opacity at the left lung base. Background peribronchial thickening. Stable heart size and mediastinal contours. No pneumothorax or significant pleural effusion. Stable osseous structures. IMPRESSION: 1. Patchy airspace disease in the right lower lung zone typical of pneumonia. Possible mild opacity at the left lung base. 2. Background peribronchial thickening. Electronically Signed   By: Chadwick Colonel M.D.   On: 08/16/2023 16:05    Scheduled Meds:  aspirin  EC  81 mg Oral Daily   clopidogrel   75 mg Oral Daily   enoxaparin  (LOVENOX ) injection  40 mg Subcutaneous Q24H   ezetimibe   10 mg Oral Daily   FLUoxetine   20 mg Oral Daily   guaiFENesin   600 mg Oral BID   insulin  aspart  0-5 Units Subcutaneous QHS   insulin  aspart  0-9 Units Subcutaneous TID WC   simvastatin   40 mg Oral Daily   sodium chloride  flush  3 mL Intravenous Q12H   Continuous Infusions:  azithromycin   cefTRIAXone (ROCEPHIN)  IV     lactated ringers  150 mL/hr at 08/17/23 0727     LOS: 1 day   Time spent: 55 minutes  Unk Garb, DO  [facility]  08/17/2023, 11:07 AM

## 2023-08-17 NOTE — TOC Initial Note (Signed)
 Transition of Care [facility]) - Initial/Assessment Note    Patient Details  Name: [name] MRN: [id number] Date of Birth: 16-Jan-1951  Transition of [facility]) CM/SW Contact:    Levie Ream, RN Phone Number: 08/17/2023, 11:45 AM  Clinical Narrative:                 Spoke w/ pt and spouse Charlyn Vialpando in room; pt says she lives at home; she plans to return at d/c; her husband will provide transportation; pt verified insurance/PCP; she denied SDOH; pt says she has cane, crutches, walker, shower chair, and BSC; pt says she does not have HH services or home oxygen; no TOC needs.  Expected Discharge Plan: Home/Self Care Barriers to Discharge: No Barriers Identified   Patient Goals and CMS Choice Patient states their goals for this hospitalization and ongoing recovery are:: home CMS Medicare.gov Compare Post Acute Care list provided to:: Patient        Expected Discharge Plan and Services   Discharge Planning Services: CM Consult Post Acute Care Choice: NA Living arrangements for the past 2 months: Single Family Home Expected Discharge Date: 08/17/23               DME Arranged: N/A DME Agency: NA       HH Arranged: NA HH Agency: NA        Prior Living Arrangements/Services Living arrangements for the past 2 months: Single Family Home Lives with:: Spouse Patient language and need for interpreter reviewed:: Yes Do you feel safe going back to the place where you live?: Yes      Need for Family Participation in Patient Care: Yes (Comment) Care giver support system in place?: Yes (comment) Current home services: DME (cane, crutches, walker, BSC, shower chair) Criminal Activity/Legal Involvement Pertinent to Current Situation/Hospitalization: No - Comment as needed  Activities of Daily Living   ADL Screening (condition at time of admission) Independently performs ADLs?: Yes (appropriate for developmental age) Is the patient deaf or have difficulty hearing?: No Does  the patient have difficulty seeing, even when wearing glasses/contacts?: No Does the patient have difficulty concentrating, remembering, or making decisions?: No  Permission Sought/Granted Permission sought to share information with : Case Manager Permission granted to share information with : Yes, Verbal Permission Granted  Share Information with NAME: Case Manager     Permission granted to share info w Relationship: Thersa Mohiuddin (spouse) [phone]     Emotional Assessment Appearance:: Appears stated age Attitude/Demeanor/Rapport: Gracious Affect (typically observed): Accepting Orientation: : Oriented to Self, Oriented to Place, Oriented to  Time, Oriented to Situation Alcohol / Substance Use: Not Applicable Psych Involvement: No (comment)  Admission diagnosis:  Sepsis due to pneumonia (HCC) [J18.9, A41.9] Pneumonia of right lower lobe due to infectious organism [J18.9] Sepsis, due to unspecified organism, unspecified whether acute organ dysfunction present [facility]) [A41.9] Patient Active Problem List   Diagnosis Date Noted   Sepsis due to pneumonia (HCC) 08/16/2023   Acute on chronic anemia 08/16/2023   Primary osteoarthritis of right knee 08/30/2021   Cervical radiculopathy 08/16/2021   Heartburn 01/30/2021   Chronic rhinitis 01/30/2021   DOE (dyspnea on exertion) 11/09/2020   Obstructive sleep apnea treated with continuous positive airway pressure (CPAP) 05/07/2018   Coronary artery disease involving native coronary artery of native heart with unstable angina pectoris (HCC) 11/03/2017   Insomnia 11/03/2017   Inadequate sleep hygiene 11/03/2017   Anxiety 07/21/2017   Acquired foot deformity, left 07/18/2016   Osteopenia 11/10/2015  HNP (herniated nucleus pulposus), lumbar 10/12/2014   Spinal stenosis at L4-L5 level 10/12/2014   Iron deficiency anemia 07/29/2014   Type 2 diabetes mellitus (HCC) 07/29/2014   Environmental and seasonal allergies 04/28/2014   Spinal stenosis,  lumbar region, with neurogenic claudication 01/06/2013   Obesity, Class II, BMI 35-39.9 10/20/2012   CAD S/P percutaneous coronary angioplasty    Hyperlipidemia associated with type 2 diabetes mellitus (HCC)    Hypertension associated with diabetes (HCC) 04/16/2012   PCP:  Kaylee Partridge, MD Pharmacy:   [facility] [address] [place] [address] Phone: [phone] Fax: [phone]  [facility] [address] [place] [address] Phone: [phone] Fax: [phone]  [facility] [facility] [facility] [address] [place] [address] Phone: [phone] Fax: [phone]  [id number] [place], [place] - [address] AT [facility] OF [facility] [address] [place] [address] Phone: [phone] Fax: [phone]     Social Drivers of Health (SDOH) Social History: SDOH Screenings   Food Insecurity: No Food Insecurity (08/17/2023)  Housing: Low Risk  (08/17/2023)  Transportation Needs: No Transportation Needs (08/17/2023)  Utilities: Not At Risk (08/17/2023)  Alcohol Screen: Low Risk  (06/25/2022)  Depression (PHQ2-9): Low Risk  (01/01/2023)  Financial Resource Strain: Medium Risk (06/25/2022)  Physical Activity: Unknown (06/25/2022)  Social Connections: Socially Integrated (08/16/2023)  Stress: No Stress Concern Present (06/25/2022)  Tobacco Use: Low Risk  (08/16/2023)   SDOH Interventions: Food Insecurity Interventions: Intervention Not Indicated, Inpatient TOC Housing Interventions: Intervention Not Indicated, Inpatient TOC Transportation Interventions: Intervention Not Indicated, Inpatient TOC Utilities Interventions: Intervention Not Indicated, Inpatient TOC   Readmission Risk Interventions     No data to display

## 2023-08-17 NOTE — Assessment & Plan Note (Addendum)
 08-17-2023 stable. A1C 7.6%

## 2023-08-17 NOTE — Subjective & Objective (Signed)
 Pt seen and examined. Met with pt and husband at bedside. Pt has been ill for at least 1 week. [place] were at the beach when she first started feeling ill. Pt had 1 night of confusion. Husband wanted to call 911 but pt refused to go be evaluated.  Admitted last night with pneumonia and sepsis. Now weaned to RA. Afebrile. Pt requesting to go home.  Pt able to walk with me 400 feet in the hallway today without any assistive device. RA sats at end of walking 400 feet was 93%.  Pt requesting to go home.

## 2023-08-17 NOTE — Care Management Obs Status (Deleted)
 MEDICARE OBSERVATION STATUS NOTIFICATION   Patient Details  Name: [name] MRN: [id number] Date of Birth: [birth year]   Medicare Observation Status Notification Given:  Yes    Levie Ream, RN 08/17/2023, 11:41 AM

## 2023-08-17 NOTE — Discharge Summary (Signed)
 [facility] Phys[facility] Discharge Summary   Patient name: [name]  Admit date:     08/16/2023  Discharge date: 08/17/2023  Attending Physician: Virgene Griffin [[id number]]  Discharge Physician: Unk Garb   PCP: Kaylee Partridge, MD  Admitted From: Home  Disposition:  Home  Recommendations for Outpatient Follow-up:  Follow up with PCP in 1-2 weeks Pt will need repeat CBC and f/u CXR. Please follow up on the following pending results: iron studies  Home Health:No Equipment/Devices: None    Discharge Condition:Stable CODE STATUS:FULL Diet recommendation: Diabetic Fluid Restriction: None  Hospital Summary: HPI: [name] is a 73 y.o. female with medical history significant for CAD s/p DES to LAD 2010, T2DM, HTN, HLD, anemia, GAD, OSA on CPAP who presented to the ED for evaluation of shortness of breath, chills, cough.   Patient states that she has been having shortness of breath, chest congestion and cough productive of yellow/green sputum, chills, and bodyaches for about 1 week.  She says she had a recent trip to [facility] that her symptoms started prior to going to the beach.  She says today she developed new watery and mucus containing diarrhea.  She has not had abdominal pain, nausea, vomiting.  She has been trying Mucinex  at home but symptoms have been worsening therefore she went to the ED for further evaluation.   [facility] ED Course  Labs/Imaging on admission: I have personally reviewed following labs and imaging studies.   Initial vitals showed BP 178/55, pulse 82, RR 19, temp 98.3 F, SpO2 94% on room air.  While in the ED patient was tachypneic with RR up to 35 and transiently hypotensive with BP 86/62 responsive to IV fluid resuscitation.  Patient was given 3 L LR, IV ceftriaxone and azithromycin , DuoNeb treatment.  The hospitalist service was consulted to admit.    Significant Events: Admitted 08/16/2023 for RLL pneumonia   Admission  Labs: WBC 18.9, HgB 9.5, plt 493 Na 137, K 3.9, CO2 of 23, BUN 17, Scr 0.99. glu 155 Lactic acid 2.1 UA spg 1.026, nitrite negative, LE negative, bacteria many  Admission Imaging Studies: CXR Patchy airspace disease in the right lower lung zone typical of pneumonia. Possible mild opacity at the left lung base. 2. Background peribronchial thickening  Significant Labs: Strep pneumo antigen negative  Significant Imaging Studies:   Antibiotic Therapy: Anti-infectives (From admission, onward)    Start     Dose/Rate Route Frequency Ordered Stop   08/17/23 1800  cefTRIAXone (ROCEPHIN) 2 g in sodium chloride  0.9 % 100 mL IVPB        2 g 200 mL/hr over 30 Minutes Intravenous Every 24 hours 08/17/23 0001 08/22/23 1759   08/17/23 1800  azithromycin  (ZITHROMAX ) 500 mg in sodium chloride  0.9 % 250 mL IVPB        500 mg 250 mL/hr over 60 Minutes Intravenous Every 24 hours 08/17/23 0001 08/22/23 1759   08/16/23 1845  cefTRIAXone (ROCEPHIN) 2 g in sodium chloride  0.9 % 100 mL IVPB        2 g 200 mL/hr over 30 Minutes Intravenous Once 08/16/23 1835 08/16/23 1934   08/16/23 1845  azithromycin  (ZITHROMAX ) 500 mg in sodium chloride  0.9 % 250 mL IVPB        500 mg 250 mL/hr over 60 Minutes Intravenous  Once 08/16/23 1835 08/16/23 2143       Procedures:   Consultants:    Hospital Course by Problem: * Sepsis due  to pneumonia (HCC) 08-17-2023 met sepsis criteria on admission with elevated lactic acid, WBC 18, CXR that showed pneumonia.  WBC down to 16. Pt weaned to RA. Pt able to walk >400 with me today without any assistance. RA O2 sats at end of walk were 93%. DC to home with vantin/zithromax . Prn albuterol  inhaler. F/u with PCP in 2 weeks for repeat CBC and f/u chest XR. She is stable for Dc. Pt also requesting to go home. No contraindications for discharge.    Acute on chronic anemia 08-17-2023 PCP to f/u on iron studies. Refer to GI as needed for additional workup. DC HgB 8.5  g/dl.    Obstructive sleep apnea treated with continuous positive airway pressure (CPAP) 08-17-2023 stable  Anxiety 08-17-2023 stable  Type 2 diabetes mellitus (HCC) 08-17-2023 stable. A1C 7.6%  Iron deficiency anemia 08-17-2023 iron studies sent. PCP to f/u on results.  Obesity, Class II, BMI 35-39.9 08-17-2023 Body mass index is 33.65 kg/m.   Hyperlipidemia associated with type 2 diabetes mellitus (HCC) 08-17-2023 stable  CAD S/P percutaneous coronary angioplasty 08-17-2023 stable  Hypertension associated with diabetes (HCC) 08-17-2023 stable    Discharge Diagnoses:  Principal Problem:   Sepsis due to pneumonia [facility]) Active Problems:   Acute on chronic anemia   Hypertension associated with diabetes (HCC)   CAD S/P percutaneous coronary angioplasty   Hyperlipidemia associated with type 2 diabetes mellitus (HCC)   Obesity, Class II, BMI 35-39.9   Iron deficiency anemia   Type 2 diabetes mellitus (HCC)   Anxiety   Obstructive sleep apnea treated with continuous positive airway pressure (CPAP)   Discharge Instructions  Discharge Instructions     Call MD for:  difficulty breathing, headache or visual disturbances   Complete by: As directed    Call MD for:  extreme fatigue   Complete by: As directed    Call MD for:  hives   Complete by: As directed    Call MD for:  persistant dizziness or light-headedness   Complete by: As directed    Call MD for:  persistant nausea and vomiting   Complete by: As directed    Call MD for:  redness, tenderness, or signs of infection (pain, swelling, redness, odor or green/yellow discharge around incision site)   Complete by: As directed    Call MD for:  severe uncontrolled pain   Complete by: As directed    Call MD for:  temperature >100.4   Complete by: As directed    Diet - low sodium heart healthy   Complete by: As directed    Diet Carb Modified   Complete by: As directed    Discharge instructions   Complete by: As  directed    1. Follow up with your primary care provider in 1-2 weeks following discharge from hospital.   Increase activity slowly   Complete by: As directed       Allergies as of 08/17/2023       Reactions   Crestor  [rosuvastatin ] Other (See Comments)   myalgia   Niacin And Related Other (See Comments)   Whelps and skin flushed, mouth tingling        Medication List     TAKE these medications    albuterol  108 (90 Base) MCG/ACT inhaler Commonly known as: VENTOLIN  HFA Inhale 2 puffs into the lungs every 6 (six) hours as needed for wheezing or shortness of breath.   amLODipine  10 MG tablet Commonly known as: NORVASC  Take 1 tablet (10 mg total)  by mouth at bedtime.   aspirin  EC 81 MG tablet Take 1 tablet (81 mg total) by mouth daily.   azithromycin  500 MG tablet Commonly known as: Zithromax  Take 1 tablet (500 mg total) by mouth daily for 5 days.   benzonatate  100 MG capsule Commonly known as: TESSALON  Take 1 capsule (100 mg total) by mouth 2 (two) times daily as needed for cough.   cefpodoxime  200 MG tablet Commonly known as: VANTIN  Take 1 tablet (200 mg total) by mouth 2 (two) times daily for 10 days.   chlorpheniramine  4 MG tablet Commonly known as: CHLOR-TRIMETON  Take 1 tablet (4 mg total) by mouth daily. What changed:  when to take this reasons to take this   cholecalciferol  1000 units tablet Commonly known as: VITAMIN D  Take 1,000 Units by mouth daily.   clonazePAM  0.5 MG tablet Commonly known as: KLONOPIN  Take 0.5-1 tablets (0.25-0.5 mg total) by mouth 2 (two) times daily as needed for anxiety.   clopidogrel  75 MG tablet Commonly known as: PLAVIX  Take 1 tablet (75 mg total) by mouth daily.   ezetimibe  10 MG tablet Commonly known as: ZETIA  Take 1 tablet (10 mg total) by mouth daily.   famotidine  20 MG tablet Commonly known as: PEPCID  Take 1 tablet (20 mg total) by mouth daily.   fenofibrate  145 MG tablet Commonly known as: TRICOR  Take 1  tablet (145 mg) by mouth daily.   FLUoxetine  20 MG capsule Commonly known as: PROZAC  Take 1 capsule (20 mg total) by mouth daily.   fluticasone  50 MCG/ACT nasal spray Commonly known as: FLONASE  Place 1 spray into both nostrils daily as needed for allergies or rhinitis.   ibuprofen 200 MG tablet Commonly known as: ADVIL Take 400 mg by mouth daily as needed for mild pain (knee pain).   icosapent  Ethyl 1 g capsule Commonly known as: Vascepa  Take 1 capsule (1 g total) by mouth 2 (two) times daily.   isosorbide  mononitrate 30 MG 24 hr tablet Commonly known as: IMDUR  Take 1 tablet (30 mg total) by mouth daily.   multivitamin with minerals Tabs tablet Take 1 tablet by mouth daily.   nitroGLYCERIN  0.4 MG SL tablet Commonly known as: NITROSTAT  Place 1 tablet under the tongue every 5 minutes as needed for chest pain.   Prolia  60 MG/ML Sosy injection Generic drug: denosumab  Inject 60 mg into the skin every 6 (six) months. Will get at office 05/03/22 [facility]   RABEprazole  20 MG tablet Commonly known as: ACIPHEX  Take 1 tablet (20 mg total) by mouth daily 30 minutes prior to food   simvastatin  40 MG tablet Commonly known as: ZOCOR  Take 1 tablet (40 mg total) by mouth daily.   telmisartan  40 MG tablet Commonly known as: MICARDIS  Take 1 tablet (40 mg total) by mouth daily.        Allergies  Allergen Reactions   Crestor  [Rosuvastatin ] Other (See Comments)    myalgia   Niacin And Related Other (See Comments)    Whelps and skin flushed, mouth tingling    Discharge Exam: Vitals:   08/17/23 0332 08/17/23 0725  BP: (!) 145/59 (!) 169/60  Pulse: 66 66  Resp:  20  Temp: 98.6 F (37 C) 98.2 F (36.8 C)  SpO2: 94% 96%    Physical Exam Vitals and nursing note reviewed.  Constitutional:      General: She is not in acute distress.    Appearance: She is obese. She is not toxic-appearing or diaphoretic.  HENT:  Head: Normocephalic and atraumatic.     Nose: Nose  normal.  Eyes:     General: No scleral icterus. Cardiovascular:     Rate and Rhythm: Normal rate and regular rhythm.  Pulmonary:     Effort: Pulmonary effort is normal.     Breath sounds: Normal breath sounds.     Comments: Right basilar rales. Initially had mild wheeze in right base prior to walking 400 feet. After walking 400 feet, no further wheezing. Abdominal:     General: Bowel sounds are normal. There is no distension.     Palpations: Abdomen is soft.  Musculoskeletal:     Right lower leg: No edema.     Left lower leg: No edema.  Skin:    General: Skin is warm and dry.     Capillary Refill: Capillary refill takes less than 2 seconds.  Neurological:     General: No focal deficit present.     Mental Status: She is alert and oriented to person, place, and time.     The results of significant diagnostics from this hospitalization (including imaging, microbiology, ancillary and laboratory) are listed below for reference.    Microbiology: Recent Results (from the past 240 hours)  Resp panel by RT-PCR (RSV, Flu A&B, Covid) Anterior Nasal Swab     Status: None   Collection Time: 08/16/23  3:42 PM   Specimen: Anterior Nasal Swab  Result Value Ref Range Status   SARS Coronavirus 2 by RT PCR NEGATIVE NEGATIVE Final    Comment: (NOTE) SARS-CoV-2 target nucleic acids are NOT DETECTED.  The SARS-CoV-2 RNA is generally detectable in upper respiratory specimens during the acute phase of infection. The lowest concentration of SARS-CoV-2 viral copies this assay can detect is 138 copies/mL. A negative result does not preclude SARS-Cov-2 infection and should not be used as the sole basis for treatment or other patient management decisions. A negative result may occur with  improper specimen collection/handling, submission of specimen other than nasopharyngeal swab, presence of viral mutation(s) within the areas targeted by this assay, and inadequate number of viral copies(<138  copies/mL). A negative result must be combined with clinical observations, patient history, and epidemiological information. The expected result is Negative.  Fact Sheet for Patients:  BloggerCourse.com  Fact Sheet for Healthcare Providers:  SeriousBroker.it  This test is no t yet approved or cleared by the [place]  FDA and  has been authorized for detection and/or diagnosis of SARS-CoV-2 by FDA under an Emergency Use Authorization (EUA). This EUA will remain  in effect (meaning this test can be used) for the duration of the COVID-19 declaration under Section 564(b)(1) of the Act, 21 U.S.C.section 360bbb-3(b)(1), unless the authorization is terminated  or revoked sooner.       Influenza A by PCR NEGATIVE NEGATIVE Final   Influenza B by PCR NEGATIVE NEGATIVE Final    Comment: (NOTE) The Xpert Xpress SARS-CoV-2/FLU/RSV plus assay is intended as an aid in the diagnosis of influenza from Nasopharyngeal swab specimens and should not be used as a sole basis for treatment. Nasal washings and aspirates are unacceptable for Xpert Xpress SARS-CoV-2/FLU/RSV testing.  Fact Sheet for Patients: BloggerCourse.com  Fact Sheet for Healthcare Providers: SeriousBroker.it  This test is not yet approved or cleared by the [place]  FDA and has been authorized for detection and/or diagnosis of SARS-CoV-2 by FDA under an Emergency Use Authorization (EUA). This EUA will remain in effect (meaning this test can be used) for the duration of the COVID-19 declaration  under Section 564(b)(1) of the Act, 21 U.S.C. section 360bbb-3(b)(1), unless the authorization is terminated or revoked.     Resp Syncytial Virus by PCR NEGATIVE NEGATIVE Final    Comment: (NOTE) Fact Sheet for Patients: BloggerCourse.com  Fact Sheet for Healthcare  Providers: SeriousBroker.it  This test is not yet approved or cleared by the [place]  FDA and has been authorized for detection and/or diagnosis of SARS-CoV-2 by FDA under an Emergency Use Authorization (EUA). This EUA will remain in effect (meaning this test can be used) for the duration of the COVID-19 declaration under Section 564(b)(1) of the Act, 21 U.S.C. section 360bbb-3(b)(1), unless the authorization is terminated or revoked.  Performed at [facility], [address] [place], [place] [address]   Blood culture (routine x 2)     Status: None (Preliminary result)   Collection Time: 08/16/23  5:51 PM   Specimen: BLOOD  Result Value Ref Range Status   Specimen Description   Final    BLOOD RIGHT ANTECUBITAL Performed at [facility], [address] [place], [place] [address]    Special Requests   Final    BOTTLES DRAWN AEROBIC AND ANAEROBIC Blood Culture results may not be optimal due to an inadequate volume of blood received in culture bottles Performed at [facility], [address] [place], [place] [address]    Culture   Final    NO GROWTH < 12 HOURS Performed at [facility], [address] [place], [place] [address]    Report Status PENDING  Incomplete  Blood culture (routine x 2)     Status: None (Preliminary result)   Collection Time: 08/16/23  7:00 PM   Specimen: BLOOD RIGHT HAND  Result Value Ref Range Status   Specimen Description   Final    BLOOD RIGHT HAND Performed at [facility], [address] [place], [place] [address]    Special Requests   Final    BOTTLES DRAWN AEROBIC AND ANAEROBIC Blood Culture adequate volume Performed at [facility], [address] [place], [place] [address]    Culture   Final    NO GROWTH < 12 HOURS Performed at [facility], [address] [place], [place] [address]    Report Status PENDING  Incomplete      Labs:  Basic Metabolic Panel: Recent Labs  Lab 08/16/23 1702 08/17/23 0011  NA 137 136  K 3.9 3.9  CL 101 106  CO2 23 23  GLUCOSE 155* 129*  BUN 17 16  CREATININE 0.99 0.87  CALCIUM  9.5 8.9   Liver Function Tests: Recent Labs  Lab 08/16/23 1702  AST 28  ALT 19  ALKPHOS 105  BILITOT 0.2  PROT 6.6  ALBUMIN 3.2*   CBC: Recent Labs  Lab 08/16/23 1702 08/17/23 0011  WBC 18.9* 16.4*  NEUTROABS 11.6*  --   HGB 9.5* 8.5*  HCT 29.1* 26.7*  MCV 77.2* 79.2*  PLT 493* 442*   CBG: Recent Labs  Lab 08/17/23 0033 08/17/23 0754  GLUCAP 130* 137*   Hgb A1c Recent Labs    08/17/23 0011  HGBA1C 7.6*   Urinalysis    Component Value Date/Time   COLORURINE YELLOW 08/16/2023 1803   APPEARANCEUR CLOUDY (A) 08/16/2023 1803   LABSPEC 1.025 08/16/2023 1803   PHURINE 5.5 08/16/2023 1803   GLUCOSEU 100 (A) 08/16/2023 1803   HGBUR NEGATIVE 08/16/2023 1803   BILIRUBINUR  NEGATIVE 08/16/2023 1803   BILIRUBINUR negative 04/17/2022 1054   KETONESUR NEGATIVE 08/16/2023 1803   PROTEINUR 100 (A) 08/16/2023 1803   UROBILINOGEN 0.2 04/17/2022 1054   UROBILINOGEN 0.2 10/01/2012 2305   NITRITE NEGATIVE 08/16/2023 1803   LEUKOCYTESUR NEGATIVE 08/16/2023 1803   Sepsis Labs Recent Labs  Lab 08/16/23 1702 08/17/23 0011  WBC 18.9* 16.4*   Pneumonia Antigens Lab Results  Component Value Date/Time   [facility] NEGATIVE 08/17/2023 12:42 AM    Procedures/Studies: DG Chest 2 View Result Date: 08/16/2023 CLINICAL DATA:  Shortness of breath and cough. EXAM: CHEST - 2 VIEW COMPARISON:  05/19/2023 FINDINGS: Patchy airspace disease in the right lower lung zone typical of pneumonia. There may be mild opacity at the left lung base. Background peribronchial thickening. Stable heart size and mediastinal contours. No pneumothorax or significant pleural effusion. Stable osseous structures. IMPRESSION: 1. Patchy airspace disease in the right lower lung zone typical of pneumonia. Possible  mild opacity at the left lung base. 2. Background peribronchial thickening. Electronically Signed   By: Chadwick Colonel M.D.   On: 08/16/2023 16:05    Time coordinating discharge: 55 mins  SIGNED:  Unk Garb, DO [facility] 08/17/23, 11:09 AM

## 2023-08-17 NOTE — Assessment & Plan Note (Addendum)
 08-17-2023 met sepsis criteria on admission with elevated lactic acid, WBC 18, CXR that showed pneumonia.  WBC down to 16. Pt weaned to RA. Pt able to walk >400 with me today without any assistance. RA O2 sats at end of walk were 93%. DC to home with vantin/zithromax . Prn albuterol  inhaler. F/u with PCP in 2 weeks for repeat CBC and f/u chest XR. She is stable for Dc. Pt also requesting to go home. No contraindications for discharge.

## 2023-08-17 NOTE — Assessment & Plan Note (Signed)
 08-17-2023 PCP to f/u on iron studies. Refer to GI as needed for additional workup. DC HgB 8.5 g/dl.

## 2023-08-17 NOTE — Care Management CC44 (Signed)
 Condition Code 44 Documentation Completed  Patient Details  Name: [name] MRN: [id number] Date of Birth: 10/02/1950   Condition Code 44 given:  Yes Patient signature on Condition Code 44 notice:  Yes Documentation of 2 MD's agreement:  Yes Code 44 added to claim:  Yes    Levie Ream, RN 08/17/2023, 11:42 AM

## 2023-08-18 LAB — LEGIONELLA PNEUMOPHILA SEROGP 1 UR AG: L. pneumophila Serogp 1 Ur Ag: NEGATIVE

## 2023-08-20 ENCOUNTER — Ambulatory Visit: Admitting: Family Medicine

## 2023-08-20 ENCOUNTER — Ambulatory Visit: Payer: Self-pay

## 2023-08-20 NOTE — Progress Notes (Unsigned)
 [facility] [address] [place], [place] [address] [phone]  Date:  08/21/2023   Name:  DAYZHA POGOSYAN   DOB:  1950/07/31   MRN:  [id number]  PCP:  Kaylee Partridge, MD    Chief Complaint: No chief complaint on file.   History of Present Illness:  SHAMELA HAYDON is a 73 y.o. very pleasant female patient who presents with the following:  Patient seen today with concern of fatigue and shortness of breath Most recent visit with myself was about 8 weeks ago for routine follow-up: History of hypertension, CAD, diabetes, sleep apnea, obesity, osteopenia with elevated fracture risk on fosamax  Status post PCI 2010 and 2014 She was seen by neurology this past winter for episodes of tremulousness and memory loss The thinking is that her episodic tremors/aphasia and stuttering are likely due to anxiety and possibly a panic attack Most recent visit with cardiology was in November: CAD with DES stent to prox. LAD in 2010 with residual disease in LCX 10-20% and RCA 20% lesion. She also has HTN, DM, dyslipidemia and obesity. Most recent cardiac cath 2022 showed Previously placed Prox LAD to Mid LAD stent (unknown type) is widely patent.---------------------  More recently she was admitted to the hospital overnight, May 24 through May 25 with pneumonia She was asked to follow-up CBC and chest x-ray, as well as pending iron studies She was sent home with azithromycin  and cefpodoxime  for pneumonia  Chest x-ray dated 5/24   FINDINGS: Patchy airspace disease in the right lower lung zone typical of pneumonia. There may be mild opacity at the left lung base. Background peribronchial thickening. Stable heart size and mediastinal contours. No pneumothorax or significant pleural effusion. Stable osseous structures.   IMPRESSION: 1. Patchy airspace disease in the right lower lung zone typical of pneumonia. Possible mild opacity at the  left lung base. 2. Background peribronchial thickening.    Patient Active Problem List   Diagnosis Date Noted   Sepsis due to pneumonia (HCC) 08/16/2023   Acute on chronic anemia 08/16/2023   Primary osteoarthritis of right knee 08/30/2021   Cervical radiculopathy 08/16/2021   Heartburn 01/30/2021   Chronic rhinitis 01/30/2021   DOE (dyspnea on exertion) 11/09/2020   Obstructive sleep apnea treated with continuous positive airway pressure (CPAP) 05/07/2018   Coronary artery disease involving native coronary artery of native heart with unstable angina pectoris (HCC) 11/03/2017   Insomnia 11/03/2017   Inadequate sleep hygiene 11/03/2017   Anxiety 07/21/2017   Acquired foot deformity, left 07/18/2016   Osteopenia 11/10/2015   HNP (herniated nucleus pulposus), lumbar 10/12/2014   Spinal stenosis at L4-L5 level 10/12/2014   Iron deficiency anemia 07/29/2014   Type 2 diabetes mellitus (HCC) 07/29/2014   Environmental and seasonal allergies 04/28/2014   Spinal stenosis, lumbar region, with neurogenic claudication 01/06/2013   Obesity, Class II, BMI 35-39.9 10/20/2012   CAD S/P percutaneous coronary angioplasty    Hyperlipidemia associated with type 2 diabetes mellitus (HCC)    Hypertension associated with diabetes (HCC) 04/16/2012    Past Medical History:  Diagnosis Date   Anemia    Arthritis    Bronchitis    hx of    CAD (coronary artery disease)    a. s/p DES to LAD in 2010 with patent stent by cath in 2014 b. low-risk NST in 11/2016   Cataracts, bilateral    Diabetes mellitus without complication (HCC)    Family  history of adverse reaction to anesthesia    pts mother had difficulty awakening    GERD (gastroesophageal reflux disease)    Hyperlipidemia LDL goal < 70    Hypertension    Lumbar back pain    Numbness    left leg and foot    Plantar fascia rupture    Left Foot   Sleep apnea    uses CPAP     Past Surgical History:  Procedure Laterality Date   ABDOMINAL  HYSTERECTOMY     BACK SURGERY     BREAST CYST ASPIRATION  1995   CAROTID STENT  2009   pt denies    CORONARY ANGIOPLASTY WITH STENT PLACEMENT  2010and 10-02-2012   Stent DES, Xience to prox. LAD   DOPPLER ECHOCARDIOGRAPHY  08/01/2009   EF=>55%,LV normal   LEFT HEART CATH AND CORONARY ANGIOGRAPHY N/A 12/10/2019   Procedure: LEFT HEART CATH AND CORONARY ANGIOGRAPHY;  Surgeon: Arty Binning, MD;  Location: [facility];  Service: Cardiovascular;  Laterality: N/A;   LEFT HEART CATH AND CORONARY ANGIOGRAPHY N/A 10/30/2020   Procedure: LEFT HEART CATH AND CORONARY ANGIOGRAPHY;  Surgeon: Avanell Leigh, MD;  Location: [facility];  Service: Cardiovascular;  Laterality: N/A;   LEFT HEART CATHETERIZATION WITH CORONARY ANGIOGRAM N/A 10/02/2012   Procedure: LEFT HEART CATHETERIZATION WITH CORONARY ANGIOGRAM;  Surgeon: Avanell Leigh, MD;  Location: [facility] CATH LAB;  Service: Cardiovascular;  Laterality: N/A;   lower arterial duplex  06/20/10   abi's normal,rgt 0.98,lft 1.06;bilateral PVRs normal   LUMBAR LAMINECTOMY/DECOMPRESSION MICRODISCECTOMY Left 01/06/2013   Procedure: MICRO LUMBAR DECOMPRESSION L4-5 AND L5-S1;  Surgeon: Loel Ring, MD;  Location: [facility];  Service: Orthopedics;  Laterality: Left;   LUMBAR LAMINECTOMY/DECOMPRESSION MICRODISCECTOMY Left 10/12/2014   Procedure: REVISION MICRO LUMBAR/DECOMPRESSION L4-5 LEFT ;  Surgeon: Orvan Blanch, MD;  Location: [facility];  Service: Orthopedics;  Laterality: Left;   NM MYOCAR PERF WALL MOTION  09/22/2008   lexiscan -EF 83%; glogal LV systolic fx is norm. ,evidence of mild ischemia basal anterior,midanterior and apical lateral region(s).    ORIF PATELLA Right 08/29/2016   Procedure: OPEN REDUCTION INTERNAL (ORIF) FIXATION RIGHT PATELLA;  Surgeon: Adonica Hoose, MD;  Location: [facility];  Service: Orthopedics;  Laterality: Right;  Adductor Block   TUBAL LIGATION     TYMPANOPLASTY Bilateral    UVULOPALATOPHARYNGOPLASTY     pt denies      Social History   Tobacco Use   Smoking status: Never   Smokeless tobacco: Never  Vaping Use   Vaping status: Never Used  Substance Use Topics   Alcohol use: Yes    Comment: occasional, 1 a month wine   Drug use: No    Family History  Problem Relation Age of Onset   Coronary artery disease Mother    Rheum arthritis Mother    Dementia Mother    Heart attack Father    Hypertension Father    Hyperlipidemia Father    Other Father        MVA   Hypertension Brother    Cancer Brother    Heart disease Brother    Cancer Paternal Grandmother        stomach   Diabetes Paternal Grandfather    Breast cancer Neg Hx     Allergies  Allergen Reactions   Crestor  [Rosuvastatin ] Other (See Comments)    myalgia   Niacin And Related Other (See Comments)    Whelps and skin flushed, mouth tingling  Medication list has been reviewed and updated.  Current Outpatient Medications on File Prior to Visit  Medication Sig Dispense Refill   albuterol  (VENTOLIN  HFA) 108 (90 Base) MCG/ACT inhaler Inhale 2 puffs into the lungs every 6 (six) hours as needed for wheezing or shortness of breath. 8 g 0   amLODipine  (NORVASC ) 10 MG tablet Take 1 tablet (10 mg total) by mouth at bedtime. 90 tablet 3   aspirin  EC 81 MG EC tablet Take 1 tablet (81 mg total) by mouth daily. 30 tablet 1   azithromycin  (ZITHROMAX ) 500 MG tablet Take 1 tablet (500 mg total) by mouth daily for 5 days. 5 tablet 0   benzonatate  (TESSALON ) 100 MG capsule Take 1 capsule (100 mg total) by mouth 2 (two) times daily as needed for cough. 20 capsule 0   cefpodoxime (VANTIN) 200 MG tablet Take 1 tablet (200 mg total) by mouth 2 (two) times daily for 10 days. 20 tablet 0   chlorpheniramine  (CHLOR-TRIMETON ) 4 MG tablet Take 1 tablet (4 mg total) by mouth daily. (Patient taking differently: Take 4 mg by mouth daily as needed for allergies.) 14 tablet 3   cholecalciferol  (VITAMIN D ) 1000 UNITS tablet Take 1,000 Units by mouth daily.      clonazePAM  (KLONOPIN ) 0.5 MG tablet Take 0.5-1 tablets (0.25-0.5 mg total) by mouth 2 (two) times daily as needed for anxiety. 30 tablet 1   clopidogrel  (PLAVIX ) 75 MG tablet Take 1 tablet (75 mg total) by mouth daily. 90 tablet 3   denosumab  (PROLIA ) 60 MG/ML SOSY injection Inject 60 mg into the skin every 6 (six) months. Will get at office 05/03/22 [facility] 180 mL 0   ezetimibe  (ZETIA ) 10 MG tablet Take 1 tablet (10 mg total) by mouth daily. 90 tablet 3   famotidine  (PEPCID ) 20 MG tablet Take 1 tablet (20 mg total) by mouth daily. 90 tablet 3   fenofibrate  (TRICOR ) 145 MG tablet Take 1 tablet (145 mg) by mouth daily. 90 tablet 3   FLUoxetine  (PROZAC ) 20 MG capsule Take 1 capsule (20 mg total) by mouth daily. 90 capsule 3   fluticasone  (FLONASE ) 50 MCG/ACT nasal spray Place 1 spray into both nostrils daily as needed for allergies or rhinitis. 16 g 11   ibuprofen (ADVIL) 200 MG tablet Take 400 mg by mouth daily as needed for mild pain (knee pain).     icosapent  Ethyl (VASCEPA ) 1 g capsule Take 1 capsule (1 g total) by mouth 2 (two) times daily. 180 capsule 3   isosorbide  mononitrate (IMDUR ) 30 MG 24 hr tablet Take 1 tablet (30 mg total) by mouth daily. 90 tablet 3   Multiple Vitamin (MULTIVITAMIN WITH MINERALS) TABS tablet Take 1 tablet by mouth daily.     nitroGLYCERIN  (NITROSTAT ) 0.4 MG SL tablet Place 1 tablet under the tongue every 5 minutes as needed for chest pain. 25 tablet 2   RABEprazole  (ACIPHEX ) 20 MG tablet Take 1 tablet (20 mg total) by mouth daily 30 minutes prior to food 90 tablet 3   simvastatin  (ZOCOR ) 40 MG tablet Take 1 tablet (40 mg total) by mouth daily. 90 tablet 3   telmisartan  (MICARDIS ) 40 MG tablet Take 1 tablet (40 mg total) by mouth daily. 90 tablet 3   [DISCONTINUED] rosuvastatin  (CRESTOR ) 20 MG tablet Take 1 tablet (20 mg total) by mouth daily. 30 tablet 3   Current Facility-Administered Medications on File Prior to Visit  Medication Dose Route Frequency  Provider Last Rate Last Admin   [  START ON 10/10/2023] denosumab  (PROLIA ) injection 60 mg  60 mg Subcutaneous Q6 months Nephtali Docken, Skipper Dumas, MD        Review of Systems:  As per HPI- otherwise negative.   Physical Examination: There were no vitals filed for this visit. There were no vitals filed for this visit. There is no height or weight on file to calculate BMI. Ideal Body Weight:    GEN: no acute distress. HEENT: Atraumatic, Normocephalic.  Ears and Nose: No external deformity. CV: RRR, No M/G/R. No JVD. No thrill. No extra heart sounds. PULM: CTA B, no wheezes, crackles, rhonchi. No retractions. No resp. distress. No accessory muscle use. ABD: S, NT, ND, +BS. No rebound. No HSM. EXTR: No c/c/e PSYCH: Normally interactive. Conversant.    Assessment and Plan: ***  Signed Gates Kasal, MD

## 2023-08-20 NOTE — Telephone Encounter (Signed)
 Chief Complaint: cough Symptoms: cough, runny nose Frequency: x 1 week Pertinent Negatives: Patient denies fever Disposition: [] ED /[] Urgent Care (no appt availability in office) / [x] Appointment(In office/virtual)/ []  [facility]/ [] Home Care/ [] Refused Recommended Disposition /[] [facility]/ []  Follow-up with PCP  Additional Notes: Pt states she has a cough and runny nose. States that she was seen overnight in the ED for pneumonia. States she is currently on antibiotics but doesn't feel like she is getting better. States clear sputum and clear nasal drainage. State that sob with execration. States that her cough is better but she is fatigue.   Copied from CRM [id number]. Topic: Clinical - Red Word Triage >> Aug 20, 2023 12:10 PM Laquanda P wrote: Red Word that prompted transfer to Nurse Triage: Terribly weak- symptoms not getting better. Went to ER 05/24 Reason for Disposition  Cough has been present for > 3 weeks  Protocols used: Cough - Acute Non-Productive-A-AH

## 2023-08-21 ENCOUNTER — Ambulatory Visit (INDEPENDENT_AMBULATORY_CARE_PROVIDER_SITE_OTHER): Admitting: Family Medicine

## 2023-08-21 ENCOUNTER — Ambulatory Visit (HOSPITAL_BASED_OUTPATIENT_CLINIC_OR_DEPARTMENT_OTHER)
Admission: RE | Admit: 2023-08-21 | Discharge: 2023-08-21 | Disposition: A | Discharge status: Home / Self Care | Source: Ambulatory Visit | Attending: Family Medicine | Admitting: Family Medicine

## 2023-08-21 ENCOUNTER — Encounter: Payer: Self-pay | Admitting: Family Medicine

## 2023-08-21 ENCOUNTER — Other Ambulatory Visit (HOSPITAL_BASED_OUTPATIENT_CLINIC_OR_DEPARTMENT_OTHER): Payer: Self-pay

## 2023-08-21 VITALS — BP 130/60 | HR 77 | Temp 97.8°F | Ht 62.0 in | Wt 184.0 lb

## 2023-08-21 DIAGNOSIS — J189 Pneumonia, unspecified organism: Secondary | ICD-10-CM | POA: Diagnosis not present

## 2023-08-21 DIAGNOSIS — R413 Other amnesia: Secondary | ICD-10-CM | POA: Diagnosis not present

## 2023-08-21 DIAGNOSIS — D509 Iron deficiency anemia, unspecified: Secondary | ICD-10-CM

## 2023-08-21 DIAGNOSIS — R918 Other nonspecific abnormal finding of lung field: Secondary | ICD-10-CM | POA: Diagnosis not present

## 2023-08-21 LAB — CBC
HCT: 31.3 % — ABNORMAL LOW (ref 36.0–46.0)
Hemoglobin: 10 g/dL — ABNORMAL LOW (ref 12.0–15.0)
MCHC: 31.9 g/dL (ref 30.0–36.0)
MCV: 77.7 fl — ABNORMAL LOW (ref 78.0–100.0)
Platelets: 849 10*3/uL — ABNORMAL HIGH (ref 150.0–400.0)
RBC: 4.03 Mil/uL (ref 3.87–5.11)
RDW: 15.1 % (ref 11.5–15.5)
WBC: 9.9 10*3/uL (ref 4.0–10.5)

## 2023-08-21 LAB — CULTURE, BLOOD (ROUTINE X 2)
Culture: NO GROWTH
Culture: NO GROWTH
Special Requests: ADEQUATE

## 2023-08-21 MED ORDER — ONDANSETRON HCL 4 MG PO TABS
4.0000 mg | ORAL_TABLET | Freq: Three times a day (TID) | ORAL | 0 refills | Status: DC | PRN
  Filled 2023-08-21: qty 20, 7d supply, fill #0

## 2023-08-21 NOTE — Patient Instructions (Addendum)
 I am sorry you got so sick!   Please stop by lab and then imaging on the ground floor to have a chest x-ray  Start on OTC iron - 65 mg every other day. Let me know if this is making you too constipated Stool softener along with your iron may also help   I suspect we will need to repeat a chest x-ray in a few weeks I will set you up for more detailed memory testing

## 2023-08-22 NOTE — Addendum Note (Signed)
 Addended by: Gates Kasal C on: 08/22/2023 05:08 PM   Modules accepted: Orders

## 2023-09-06 ENCOUNTER — Other Ambulatory Visit (HOSPITAL_COMMUNITY): Payer: Self-pay

## 2023-09-06 ENCOUNTER — Other Ambulatory Visit: Payer: Self-pay | Admitting: Family Medicine

## 2023-09-06 DIAGNOSIS — R251 Tremor, unspecified: Secondary | ICD-10-CM

## 2023-09-08 ENCOUNTER — Other Ambulatory Visit: Payer: Self-pay

## 2023-09-08 ENCOUNTER — Other Ambulatory Visit (HOSPITAL_COMMUNITY): Payer: Self-pay

## 2023-09-08 MED ORDER — CLONAZEPAM 0.5 MG PO TABS
0.2500 mg | ORAL_TABLET | Freq: Two times a day (BID) | ORAL | 1 refills | Status: DC | PRN
  Filled 2023-09-08: qty 30, 15d supply, fill #0
  Filled 2023-10-24: qty 30, 15d supply, fill #1

## 2023-09-08 MED ORDER — FAMOTIDINE 20 MG PO TABS
20.0000 mg | ORAL_TABLET | Freq: Every day | ORAL | 0 refills | Status: DC
  Filled 2023-09-08: qty 90, 90d supply, fill #0

## 2023-09-28 ENCOUNTER — Encounter: Payer: Self-pay | Admitting: Family Medicine

## 2023-09-28 NOTE — Progress Notes (Addendum)
 [facility] at [facility] [address] [place], [place] [address] [phone]  Date:  10/01/2023   Name:  [name]   DOB:  Jul 31, 1950   MRN:  [id number]  PCP:  Watt Harlene BROCKS, MD    Chief Complaint: Medical Management of Chronic Issues (Follow up from pnuemonia)   History of Present Illness:  [name] is a 73 y.o. very pleasant female patient who presents with the following:  Patient seen today for periodic follow-up-most recent visit with myself was in late May after she had recently been admitted overnight with community-acquired pneumonia and we were also concerned about her memory.  I placed a referral to neuropsychology for evaluation.  I have asked to see her back in 3 months so they are here a bit early. She is finally feeling like she is getting back to normal from her recent illness.  [name] notes they did not hear anything about her neuropsychology evaluation yet.  Looking in the chart I do not see any progress on this referral-I will follow-up with our referrals team.  They do note her memory is better since she was sick but [name] is still concerned that she needs to be evaluated further  History of hypertension, CAD, diabetes, sleep apnea, obesity, osteopenia with elevated fracture risk on fosamax  Status post PCI 2010 and 2014 She was seen by neurology this past winter for episodes of tremulousness and memory loss The thinking is that her episodic tremors/aphasia and stuttering are likely due to anxiety and possibly a panic attack She last saw cardiology in [facility] with DES stent to prox. LAD in 2010 with residual disease in LCX 10-20% and RCA 20% lesion. She also has HTN, DM, dyslipidemia and obesity. Most recent cardiac cath 2022 showed Previously placed Prox LAD to Mid LAD stent (unknown type) is widely patent.    Can obtain urine micro today Patient was told by GI that she is due for colonoscopy next year-we  have been watching her iron and she did start on iron supplementation about 6 weeks ago. She notes some constipation from her iron - she is taking 65 mg otc daily.  We will check her levels today.  If we are not able to back off on her oral dosage she would be interested in doing IV iron due to side effects She has had negative occult blood testing in 2024 and 2023  Pt is on prozac , has taken for a few years now.  She notes she will still get pretty anxious if something is going on -she seems to have more anxiety than would be expected for the situation.  For example they are working on closing down the home of a deceased family member which is causing her a lot of stress.  She is tolerating fluoxetine  well but not having adequate control symptoms.  We decided to have her try taking 40 mg for a few weeks with what she has on hand.  Depending on her response we can keep her on 40 mg or try a different medication  Lab Results  Component Value Date   HGBA1C 7.6 (H) 08/17/2023    Patient Active Problem List   Diagnosis Date Noted   Sepsis due to pneumonia (HCC) 08/16/2023   Acute on chronic anemia 08/16/2023   Primary osteoarthritis of right knee 08/30/2021   Cervical radiculopathy 08/16/2021   Heartburn 01/30/2021   Chronic rhinitis 01/30/2021   DOE (dyspnea  on exertion) 11/09/2020   Obstructive sleep apnea treated with continuous positive airway pressure (CPAP) 05/07/2018   Coronary artery disease involving native coronary artery of native heart with unstable angina pectoris (HCC) 11/03/2017   Insomnia 11/03/2017   Inadequate sleep hygiene 11/03/2017   Anxiety 07/21/2017   Acquired foot deformity, left 07/18/2016   Osteopenia 11/10/2015   HNP (herniated nucleus pulposus), lumbar 10/12/2014   Spinal stenosis at L4-L5 level 10/12/2014   Iron deficiency anemia 07/29/2014   Type 2 diabetes mellitus (HCC) 07/29/2014   Environmental and seasonal allergies 04/28/2014   Spinal stenosis, lumbar  region, with neurogenic claudication 01/06/2013   Obesity, Class II, BMI 35-39.9 10/20/2012   CAD S/P percutaneous coronary angioplasty    Hyperlipidemia associated with type 2 diabetes mellitus (HCC)    Hypertension associated with diabetes (HCC) 04/16/2012    Past Medical History:  Diagnosis Date   Anemia    Arthritis    Bronchitis    hx of    CAD (coronary artery disease)    a. s/p DES to LAD in 2010 with patent stent by cath in 2014 b. low-risk NST in 11/2016   Cataracts, bilateral    Diabetes mellitus without complication (HCC)    Family history of adverse reaction to anesthesia    pts mother had difficulty awakening    GERD (gastroesophageal reflux disease)    Hyperlipidemia LDL goal < 70    Hypertension    Lumbar back pain    Numbness    left leg and foot    Plantar fascia rupture    Left Foot   Sleep apnea    uses CPAP     Past Surgical History:  Procedure Laterality Date   ABDOMINAL HYSTERECTOMY     BACK SURGERY     BREAST CYST ASPIRATION  1995   CAROTID STENT  2009   pt denies    CORONARY ANGIOPLASTY WITH STENT PLACEMENT  2010and 10-02-2012   Stent DES, Xience to prox. LAD   DOPPLER ECHOCARDIOGRAPHY  08/01/2009   EF=>55%,LV normal   LEFT HEART CATH AND CORONARY ANGIOGRAPHY N/A 12/10/2019   Procedure: LEFT HEART CATH AND CORONARY ANGIOGRAPHY;  Surgeon: Claudene Victory ORN, MD;  Location: [facility] INVASIVE CV LAB;  Service: Cardiovascular;  Laterality: N/A;   LEFT HEART CATH AND CORONARY ANGIOGRAPHY N/A 10/30/2020   Procedure: LEFT HEART CATH AND CORONARY ANGIOGRAPHY;  Surgeon: Court Dorn PARAS, MD;  Location: [facility] INVASIVE CV LAB;  Service: Cardiovascular;  Laterality: N/A;   LEFT HEART CATHETERIZATION WITH CORONARY ANGIOGRAM N/A 10/02/2012   Procedure: LEFT HEART CATHETERIZATION WITH CORONARY ANGIOGRAM;  Surgeon: Dorn PARAS Court, MD;  Location: [facility] CATH LAB;  Service: Cardiovascular;  Laterality: N/A;   lower arterial duplex  06/20/10   abi's normal,rgt 0.98,lft  1.06;bilateral PVRs normal   LUMBAR LAMINECTOMY/DECOMPRESSION MICRODISCECTOMY Left 01/06/2013   Procedure: MICRO LUMBAR DECOMPRESSION L4-5 AND L5-S1;  Surgeon: Reyes JAYSON Billing, MD;  Location: [facility] ORS;  Service: Orthopedics;  Laterality: Left;   LUMBAR LAMINECTOMY/DECOMPRESSION MICRODISCECTOMY Left 10/12/2014   Procedure: REVISION MICRO LUMBAR/DECOMPRESSION L4-5 LEFT ;  Surgeon: Reyes Billing, MD;  Location: [facility] ORS;  Service: Orthopedics;  Laterality: Left;   NM MYOCAR PERF WALL MOTION  09/22/2008   lexiscan -EF 83%; glogal LV systolic fx is norm. ,evidence of mild ischemia basal anterior,midanterior and apical lateral region(s).    ORIF PATELLA Right 08/29/2016   Procedure: OPEN REDUCTION INTERNAL (ORIF) FIXATION RIGHT PATELLA;  Surgeon: Fidel Rogue, MD;  Location: [facility] ORS;  Service: Orthopedics;  Laterality:  Right;  Adductor Block   TUBAL LIGATION     TYMPANOPLASTY Bilateral    UVULOPALATOPHARYNGOPLASTY     pt denies     Social History   Tobacco Use   Smoking status: Never   Smokeless tobacco: Never  Vaping Use   Vaping status: Never Used  Substance Use Topics   Alcohol use: Yes    Comment: occasional, 1 a month wine   Drug use: No    Family History  Problem Relation Age of Onset   Coronary artery disease Mother    Rheum arthritis Mother    Dementia Mother    Heart attack Father    Hypertension Father    Hyperlipidemia Father    Other Father        MVA   Hypertension Brother    Cancer Brother    Heart disease Brother    Cancer Paternal Grandmother        stomach   Diabetes Paternal Grandfather    Breast cancer Neg Hx     Allergies  Allergen Reactions   Crestor  [Rosuvastatin ] Other (See Comments)    myalgia   Niacin And Related Other (See Comments)    Whelps and skin flushed, mouth tingling    Medication list has been reviewed and updated.  Current Outpatient Medications on File Prior to Visit  Medication Sig Dispense Refill   albuterol  (VENTOLIN  HFA) 108 (90  Base) MCG/ACT inhaler Inhale 2 puffs into the lungs every 6 (six) hours as needed for wheezing or shortness of breath. 8 g 0   amLODipine  (NORVASC ) 10 MG tablet Take 1 tablet (10 mg total) by mouth at bedtime. 90 tablet 3   aspirin  EC 81 MG EC tablet Take 1 tablet (81 mg total) by mouth daily. 30 tablet 1   benzonatate  (TESSALON ) 100 MG capsule Take 1 capsule (100 mg total) by mouth 2 (two) times daily as needed for cough. 20 capsule 0   chlorpheniramine  (CHLOR-TRIMETON ) 4 MG tablet Take 1 tablet (4 mg total) by mouth daily. (Patient taking differently: Take 4 mg by mouth daily as needed for allergies.) 14 tablet 3   cholecalciferol  (VITAMIN D ) 1000 UNITS tablet Take 1,000 Units by mouth daily.     clonazePAM  (KLONOPIN ) 0.5 MG tablet Take 0.5-1 tablets (0.25-0.5 mg total) by mouth 2 (two) times daily as needed for anxiety. 30 tablet 1   clopidogrel  (PLAVIX ) 75 MG tablet Take 1 tablet (75 mg total) by mouth daily. 90 tablet 3   denosumab  (PROLIA ) 60 MG/ML SOSY injection Inject 60 mg into the skin every 6 (six) months. Will get at office 05/03/22 [facility] 180 mL 0   ezetimibe  (ZETIA ) 10 MG tablet Take 1 tablet (10 mg total) by mouth daily. 90 tablet 3   famotidine  (PEPCID ) 20 MG tablet Take 1 tablet (20 mg total) by mouth daily. 90 tablet 0   fenofibrate  (TRICOR ) 145 MG tablet Take 1 tablet (145 mg) by mouth daily. 90 tablet 3   FLUoxetine  (PROZAC ) 20 MG capsule Take 1 capsule (20 mg total) by mouth daily. 90 capsule 3   fluticasone  (FLONASE ) 50 MCG/ACT nasal spray Place 1 spray into both nostrils daily as needed for allergies or rhinitis. 16 g 11   ibuprofen (ADVIL) 200 MG tablet Take 400 mg by mouth daily as needed for mild pain (knee pain).     icosapent  Ethyl (VASCEPA ) 1 g capsule Take 1 capsule (1 g total) by mouth 2 (two) times daily. 180 capsule 3   isosorbide   mononitrate (IMDUR ) 30 MG 24 hr tablet Take 1 tablet (30 mg total) by mouth daily. 90 tablet 3   Multiple Vitamin (MULTIVITAMIN  WITH MINERALS) TABS tablet Take 1 tablet by mouth daily.     nitroGLYCERIN  (NITROSTAT ) 0.4 MG SL tablet Place 1 tablet under the tongue every 5 minutes as needed for chest pain. 25 tablet 2   ondansetron  (ZOFRAN ) 4 MG tablet Take 1 tablet (4 mg total) by mouth every 8 (eight) hours as needed for nausea or vomiting. 20 tablet 0   RABEprazole  (ACIPHEX ) 20 MG tablet Take 1 tablet (20 mg total) by mouth daily 30 minutes prior to food 90 tablet 3   simvastatin  (ZOCOR ) 40 MG tablet Take 1 tablet (40 mg total) by mouth daily. 90 tablet 3   telmisartan  (MICARDIS ) 40 MG tablet Take 1 tablet (40 mg total) by mouth daily. 90 tablet 3   [DISCONTINUED] rosuvastatin  (CRESTOR ) 20 MG tablet Take 1 tablet (20 mg total) by mouth daily. 30 tablet 3   Current Facility-Administered Medications on File Prior to Visit  Medication Dose Route Frequency Provider Last Rate Last Admin   [START ON 10/10/2023] denosumab  (PROLIA ) injection 60 mg  60 mg Subcutaneous Q6 months Danyell Awbrey, Harlene BROCKS, MD        Review of Systems:  As per HPI- otherwise negative. Wt Readings from Last 3 Encounters:  10/01/23 193 lb (87.5 kg)  08/21/23 184 lb (83.5 kg)  08/16/23 184 lb (83.5 kg)    Physical Examination: Vitals:   10/01/23 0812  BP: 130/72  Pulse: 63  Temp: 98 F (36.7 C)  SpO2: 100%   Vitals:   10/01/23 0812  Weight: 193 lb (87.5 kg)  Height: 5' 2 (1.575 m)   Body mass index is 35.3 kg/m. Ideal Body Weight: Weight in (lb) to have BMI = 25: 136.4  GEN: no acute distress.  Obese, looks well.  Accompanied by her husband [name] today HEENT: Atraumatic, Normocephalic.  Ears and Nose: No external deformity. CV: RRR, No M/G/R. No JVD. No thrill. No extra heart sounds. PULM: CTA B, no wheezes, crackles, rhonchi. No retractions. No resp. distress. No accessory muscle use. EXTR: No c/c/e- no edema  PSYCH: Normally interactive. Conversant.    Assessment and Plan: Memory change - Plan: Ambulatory referral to  Neuropsychology  Iron deficiency anemia, unspecified iron deficiency anemia type - Plan: CBC  Dyslipidemia  Coronary artery disease of native artery of native heart with stable angina pectoris (HCC)  Essential hypertension  Type 2 diabetes mellitus with other specified complication, without long-term current use of insulin  (HCC) - Plan: Microalbumin / creatinine urine ratio  Iron deficiency - Plan: Iron, TIBC and Ferritin Panel  Patient seen today for follow-up from recent hospitalization, concerns about memory and iron deficiency anemia  She is taking oral iron but notes significant constipation.  Will check her levels today.  If we are not able to reduce her dosage she would like to do IV iron potentially.  We also may need to do a repeat fecal occult blood testing  I ordered a neuropsychology evaluation at the end of May but I do not see any movement on this referral.  Will follow-up with our referrals team  Too early to recheck A1c today, we will check urine micro  Plan for 72-month follow-up Signed Harlene Schroeder, MD  Received labs as below, message patient  Results for orders placed or performed in visit on 10/01/23  Iron, TIBC and Ferritin Panel   Collection Time:  10/01/23  8:35 AM  Result Value Ref Range   Iron 41 (L) 45 - 160 mcg/dL   TIBC 545 (H) 749 - 549 mcg/dL (calc)   %SAT 9 (L) 16 - 45 % (calc)   Ferritin 51 16 - 288 ng/mL  Microalbumin / creatinine urine ratio   Collection Time: 10/01/23  8:35 AM  Result Value Ref Range   Microalb, Ur 3.0 (H) 0.0 - 1.9 mg/dL   Creatinine,U 876.1 mg/dL   Microalb Creat Ratio 24.2 0.0 - 30.0 mg/g  CBC   Collection Time: 10/01/23  8:35 AM  Result Value Ref Range   WBC 11.0 (H) 4.0 - 10.5 K/uL   RBC 4.61 3.87 - 5.11 Mil/uL   Platelets 328.0 150.0 - 400.0 K/uL   Hemoglobin 11.7 (L) 12.0 - 15.0 g/dL   HCT 63.4 63.9 - 53.9 %   MCV 79.1 78.0 - 100.0 fl   MCHC 32.0 30.0 - 36.0 g/dL   RDW 83.5 (H) 88.4 - 84.4 %   Received  her iron studies 7/12- message to pt

## 2023-09-29 ENCOUNTER — Ambulatory Visit (HOSPITAL_BASED_OUTPATIENT_CLINIC_OR_DEPARTMENT_OTHER)
Admission: RE | Admit: 2023-09-29 | Discharge: 2023-09-29 | Disposition: A | Discharge status: Home / Self Care | Source: Ambulatory Visit | Attending: Family Medicine | Admitting: Family Medicine

## 2023-09-29 ENCOUNTER — Encounter: Payer: Self-pay | Admitting: Family Medicine

## 2023-09-29 DIAGNOSIS — R918 Other nonspecific abnormal finding of lung field: Secondary | ICD-10-CM | POA: Diagnosis not present

## 2023-09-29 DIAGNOSIS — J189 Pneumonia, unspecified organism: Secondary | ICD-10-CM | POA: Insufficient documentation

## 2023-09-29 DIAGNOSIS — I2510 Atherosclerotic heart disease of native coronary artery without angina pectoris: Secondary | ICD-10-CM | POA: Diagnosis not present

## 2023-10-01 ENCOUNTER — Encounter: Payer: Self-pay | Admitting: Family Medicine

## 2023-10-01 ENCOUNTER — Ambulatory Visit: Admitting: Family Medicine

## 2023-10-01 ENCOUNTER — Ambulatory Visit (INDEPENDENT_AMBULATORY_CARE_PROVIDER_SITE_OTHER): Admitting: Family Medicine

## 2023-10-01 VITALS — BP 130/72 | HR 63 | Temp 98.0°F | Ht 62.0 in | Wt 193.0 lb

## 2023-10-01 DIAGNOSIS — E1169 Type 2 diabetes mellitus with other specified complication: Secondary | ICD-10-CM

## 2023-10-01 DIAGNOSIS — R413 Other amnesia: Secondary | ICD-10-CM | POA: Diagnosis not present

## 2023-10-01 DIAGNOSIS — I10 Essential (primary) hypertension: Secondary | ICD-10-CM

## 2023-10-01 DIAGNOSIS — E785 Hyperlipidemia, unspecified: Secondary | ICD-10-CM | POA: Diagnosis not present

## 2023-10-01 DIAGNOSIS — E611 Iron deficiency: Secondary | ICD-10-CM

## 2023-10-01 DIAGNOSIS — D509 Iron deficiency anemia, unspecified: Secondary | ICD-10-CM

## 2023-10-01 DIAGNOSIS — I25118 Atherosclerotic heart disease of native coronary artery with other forms of angina pectoris: Secondary | ICD-10-CM | POA: Diagnosis not present

## 2023-10-01 DIAGNOSIS — Z1211 Encounter for screening for malignant neoplasm of colon: Secondary | ICD-10-CM

## 2023-10-01 LAB — MICROALBUMIN / CREATININE URINE RATIO
Creatinine,U: 123.8 mg/dL
Microalb Creat Ratio: 24.2 mg/g (ref 0.0–30.0)
Microalb, Ur: 3 mg/dL — ABNORMAL HIGH (ref 0.0–1.9)

## 2023-10-01 LAB — CBC
HCT: 36.5 % (ref 36.0–46.0)
Hemoglobin: 11.7 g/dL — ABNORMAL LOW (ref 12.0–15.0)
MCHC: 32 g/dL (ref 30.0–36.0)
MCV: 79.1 fl (ref 78.0–100.0)
Platelets: 328 K/uL (ref 150.0–400.0)
RBC: 4.61 Mil/uL (ref 3.87–5.11)
RDW: 16.4 % — ABNORMAL HIGH (ref 11.5–15.5)
WBC: 11 K/uL — ABNORMAL HIGH (ref 4.0–10.5)

## 2023-10-01 NOTE — Patient Instructions (Signed)
 Good to see you today!  I will follow-up on your neuropsychology referral and try to find out where we are with this!   Try taking 40 mg of your fluoxetine  daily for the next 2-4 weeks.  Let me know how this works for you.  I can keep you on the 40 mg or try something else if not helping you   We will check your iron today and see if we can back off on your dosage

## 2023-10-02 LAB — IRON,TIBC AND FERRITIN PANEL
%SAT: 9 % — ABNORMAL LOW (ref 16–45)
Ferritin: 51 ng/mL (ref 16–288)
Iron: 41 ug/dL — ABNORMAL LOW (ref 45–160)
TIBC: 454 ug/dL — ABNORMAL HIGH (ref 250–450)

## 2023-10-04 ENCOUNTER — Encounter: Payer: Self-pay | Admitting: Family Medicine

## 2023-10-04 NOTE — Addendum Note (Signed)
 Addended by: WATT RAISIN C on: 10/04/2023 06:51 AM   Modules accepted: Orders

## 2023-10-10 ENCOUNTER — Telehealth: Payer: Self-pay

## 2023-10-10 ENCOUNTER — Other Ambulatory Visit (HOSPITAL_COMMUNITY): Payer: Self-pay

## 2023-10-10 NOTE — Telephone Encounter (Signed)
 Pt ready for scheduling for PROLIA  on or after : 11/10/23  Option# 1: Buy/Bill (Office supplied medication)  Out-of-pocket cost due at time of clinic visit: $332  Number of injection/visits approved: ---  Primary: [employer] Prolia  co-insurance: 20% Admin fee co-insurance: 0%  Secondary: --- Prolia  co-insurance:  Admin fee co-insurance:   Medical Benefit Details: Date Benefits were checked: 10/10/23 Deductible: NO/ Coinsurance: 20%/ Admin Fee: 0%  Prior Auth: N/A PA# Expiration Date:   # of doses approved: ----------------------------------------------------------------------- Option# 2- Med Obtained from pharmacy:  Pharmacy benefit: Copay $250 (Paid to pharmacy) Admin Fee: 0% (Pay at clinic)  Prior Auth: N/A PA# Expiration Date:   # of doses approved:   If patient wants fill through the pharmacy benefit please send prescription to: [facility], and include estimated need by date in rx notes. Pharmacy will ship medication directly to the office.  Patient NOT eligible for Prolia  Copay Card. Copay Card can make patient's cost as little as $25. Link to apply: [email or web address]://www.[email or web address]  ** This summary of benefits is an estimation of the patient's out-of-pocket cost. Exact cost may very based on individual plan coverage.

## 2023-10-10 NOTE — Telephone Encounter (Signed)
 Prolia  VOB initiated via [email or web address]  Next Prolia  inj DUE: 11/10/23

## 2023-10-14 ENCOUNTER — Other Ambulatory Visit: Payer: Self-pay | Admitting: *Deleted

## 2023-10-14 MED ORDER — DENOSUMAB 60 MG/ML ~~LOC~~ SOSY
60.0000 mg | PREFILLED_SYRINGE | SUBCUTANEOUS | 0 refills | Status: DC
  Filled 2023-10-14: qty 180, fill #0
  Filled 2023-10-31: qty 1, 180d supply, fill #0

## 2023-10-15 ENCOUNTER — Other Ambulatory Visit: Payer: Self-pay

## 2023-10-15 NOTE — Progress Notes (Signed)
 Pharmacy Patient Advocate Encounter  Insurance verification completed.   The patient is insured through [facility] [employer] ADVANTAGE/RX ADVANCE   Ran test claim for Prolia. Co-pay is $250.   This test claim was processed through [facility]- copay amounts may vary at other pharmacies due to pharmacy/plan contracts, or as the patient moves through the different stages of their insurance plan.

## 2023-10-23 ENCOUNTER — Telehealth: Payer: Self-pay

## 2023-10-23 ENCOUNTER — Other Ambulatory Visit (HOSPITAL_COMMUNITY): Payer: Self-pay

## 2023-10-23 ENCOUNTER — Other Ambulatory Visit: Payer: Self-pay

## 2023-10-23 DIAGNOSIS — Z1211 Encounter for screening for malignant neoplasm of colon: Secondary | ICD-10-CM | POA: Diagnosis not present

## 2023-10-23 DIAGNOSIS — K5904 Chronic idiopathic constipation: Secondary | ICD-10-CM | POA: Diagnosis not present

## 2023-10-23 DIAGNOSIS — K219 Gastro-esophageal reflux disease without esophagitis: Secondary | ICD-10-CM | POA: Diagnosis not present

## 2023-10-23 MED ORDER — GOLYTELY 236 G PO SOLR
ORAL | 0 refills | Status: DC
  Filled 2023-10-23: qty 4000, 1d supply, fill #0

## 2023-10-23 NOTE — Telephone Encounter (Signed)
   Name: [name]  DOB: 02/22/1951  MRN: [id number]  Primary Cardiologist: Vinie JAYSON Maxcy, MD   Preoperative team, please contact this patient and set up a phone call appointment for further preoperative risk assessment. Please obtain consent and complete medication review. Thank you for your help.  I confirm that guidance regarding antiplatelet and oral anticoagulation therapy has been completed and, if necessary, noted below.  Her Plavix  may be held for 5 days prior to her procedure.  Her aspirin  should be continued throughout the perioperative period.  I also confirmed the patient resides in the state of [place] . As per [place] [facility] [employer] telemedicine laws, the patient must reside in the state in which the provider is licensed.   Josefa CHRISTELLA Beauvais, NP 10/23/2023, 1:05 PM [facility]

## 2023-10-23 NOTE — Telephone Encounter (Signed)
 Left message to call back to schedule tele pre op appt.

## 2023-10-23 NOTE — Telephone Encounter (Signed)
   Pre-operative Risk Assessment    Patient Name: [name]  DOB: 07/06/1950 MRN: [id number]   Date of last office visit: 02/19/23 LAMARR SATTERFIELD, NP Date of next office visit: NONE   Request for Surgical Clearance    Procedure:  EGD AND COLONOSCOPY  Date of Surgery:  Clearance 11/05/23                                Surgeon:  DR KRISTIE Surgeon's Group or Practice Name:  [facility] Phone number:  [phone] Fax number:  [phone]   Type of Clearance Requested:   - Medical  - Pharmacy:  Hold Aspirin  and Clopidogrel  (Plavix )     Type of Anesthesia:  PROPOFOL    Additional requests/questions:    SignedLucie DELENA Ku   10/23/2023, 11:31 AM

## 2023-10-24 ENCOUNTER — Other Ambulatory Visit: Payer: Self-pay

## 2023-10-27 ENCOUNTER — Telehealth: Payer: Self-pay

## 2023-10-27 ENCOUNTER — Other Ambulatory Visit (HOSPITAL_COMMUNITY): Payer: Self-pay

## 2023-10-27 NOTE — Telephone Encounter (Signed)
  Patient Consent for Virtual Visit        [name] has provided verbal consent on 10/27/2023 for a virtual visit (video or telephone).   CONSENT FOR VIRTUAL VISIT FOR:  [name]  By participating in this virtual visit I agree to the following:  I hereby voluntarily request, consent and authorize [facility] and its employed or contracted physicians, physician assistants, nurse practitioners or other licensed health care professionals (the Practitioner), to provide me with telemedicine health care services (the "Services) as deemed necessary by the treating Practitioner. I acknowledge and consent to receive the Services by the Practitioner via telemedicine. I understand that the telemedicine visit will involve communicating with the Practitioner through live audiovisual communication technology and the disclosure of certain medical information by electronic transmission. I acknowledge that I have been given the opportunity to request an in-person assessment or other available alternative prior to the telemedicine visit and am voluntarily participating in the telemedicine visit.  I understand that I have the right to withhold or withdraw my consent to the use of telemedicine in the course of my care at any time, without affecting my right to future care or treatment, and that the Practitioner or I may terminate the telemedicine visit at any time. I understand that I have the right to inspect all information obtained and/or recorded in the course of the telemedicine visit and may receive copies of available information for a reasonable fee.  I understand that some of the potential risks of receiving the Services via telemedicine include:  Delay or interruption in medical evaluation due to technological equipment failure or disruption; Information transmitted may not be sufficient (e.g. poor resolution of images) to allow for appropriate medical decision making by the Practitioner;  and/or  In rare instances, security protocols could fail, causing a breach of personal health information.  Furthermore, I acknowledge that it is my responsibility to provide information about my medical history, conditions and care that is complete and accurate to the best of my ability. I acknowledge that Practitioner's advice, recommendations, and/or decision may be based on factors not within their control, such as incomplete or inaccurate data provided by me or distortions of diagnostic images or specimens that may result from electronic transmissions. I understand that the practice of medicine is not an exact science and that Practitioner makes no warranties or guarantees regarding treatment outcomes. I acknowledge that a copy of this consent can be made available to me via my patient portal [facility] MyChart), or I can request a printed copy by calling the office of  [facility].    I understand that my insurance will be billed for this visit.   I have read or had this consent read to me. I understand the contents of this consent, which adequately explains the benefits and risks of the Services being provided via telemedicine.  I have been provided ample opportunity to ask questions regarding this consent and the Services and have had my questions answered to my satisfaction. I give my informed consent for the services to be provided through the use of telemedicine in my medical care

## 2023-10-27 NOTE — Telephone Encounter (Signed)
 Pt scheduled for VV on 10/29/23

## 2023-10-28 ENCOUNTER — Other Ambulatory Visit (HOSPITAL_COMMUNITY): Payer: Self-pay

## 2023-10-29 ENCOUNTER — Ambulatory Visit: Attending: Cardiology

## 2023-10-29 DIAGNOSIS — Z01810 Encounter for preprocedural cardiovascular examination: Secondary | ICD-10-CM

## 2023-10-29 NOTE — Progress Notes (Signed)
 Virtual Visit via Telephone Note   Because of [name] co-morbid illnesses, she is at least at moderate risk for complications without adequate follow up.  This format is felt to be most appropriate for this patient at this time.  Due to technical limitations with video connection (technology), today's appointment will be conducted as an audio only telehealth visit, and BREON DISS verbally agreed to proceed in this manner.   All issues noted in this document were discussed and addressed.  No physical exam could be performed with this format.  Evaluation Performed:  Preoperative cardiovascular risk assessment _____________   Date:  10/29/2023   Patient ID:  [name], DOB 1950-06-27, MRN [id number] Patient Location:  Home Provider location:   Office  Primary Care Provider:  Watt Harlene BROCKS, MD Primary Cardiologist:  Vinie BROCKS Maxcy, MD  Chief Complaint / Patient Profile   73 y.o. y/o female with a h/o CAD with DES to proximal LAD in 2010 with residual disease in LCx 10 to 20% and RCA 20% lesion, HTN, DM, dyslipidemia, and obesity who is pending colonoscopy and EGD and presents today for telephonic preoperative cardiovascular risk assessment.  History of Present Illness    [name] is a 73 y.o. female who presents via audio/video conferencing for a telehealth visit today.  Pt was last seen in cardiology clinic on 02/19/2023 by Lamarr Satterfield, NP.  At that time RACQUELLE HYSER was doing well.  The patient is now pending procedure as outlined above. Since her last visit, she has been doing fine without any cardiovascular issues.  No chest pain or shortness of breath.  She feels like nothing slows her down.  Her Plavix  may be held for 5 days prior to her procedure. Her aspirin  should be continued throughout the perioperative period. She can restart plavix  when medically safe to do so.   Past Medical History    Past Medical History:  Diagnosis Date   Anemia    Arthritis     Bronchitis    hx of    CAD (coronary artery disease)    a. s/p DES to LAD in 2010 with patent stent by cath in 2014 b. low-risk NST in 11/2016   Cataracts, bilateral    Diabetes mellitus without complication (HCC)    Family history of adverse reaction to anesthesia    pts mother had difficulty awakening    GERD (gastroesophageal reflux disease)    Hyperlipidemia LDL goal < 70    Hypertension    Lumbar back pain    Numbness    left leg and foot    Plantar fascia rupture    Left Foot   Sleep apnea    uses CPAP    Past Surgical History:  Procedure Laterality Date   ABDOMINAL HYSTERECTOMY     BACK SURGERY     BREAST CYST ASPIRATION  1995   CAROTID STENT  2009   pt denies    CORONARY ANGIOPLASTY WITH STENT PLACEMENT  2010and 10-02-2012   Stent DES, Xience to prox. LAD   DOPPLER ECHOCARDIOGRAPHY  08/01/2009   EF=>55%,LV normal   LEFT HEART CATH AND CORONARY ANGIOGRAPHY N/A 12/10/2019   Procedure: LEFT HEART CATH AND CORONARY ANGIOGRAPHY;  Surgeon: Claudene Victory ORN, MD;  Location: [facility];  Service: Cardiovascular;  Laterality: N/A;   LEFT HEART CATH AND CORONARY ANGIOGRAPHY N/A 10/30/2020   Procedure: LEFT HEART CATH AND CORONARY ANGIOGRAPHY;  Surgeon: Court Dorn PARAS, MD;  Location: [facility];  Service: Cardiovascular;  Laterality: N/A;   LEFT HEART CATHETERIZATION WITH CORONARY ANGIOGRAM N/A 10/02/2012   Procedure: LEFT HEART CATHETERIZATION WITH CORONARY ANGIOGRAM;  Surgeon: Dorn JINNY Lesches, MD;  Location: [facility] CATH LAB;  Service: Cardiovascular;  Laterality: N/A;   lower arterial duplex  06/20/10   abi's normal,rgt 0.98,lft 1.06;bilateral PVRs normal   LUMBAR LAMINECTOMY/DECOMPRESSION MICRODISCECTOMY Left 01/06/2013   Procedure: MICRO LUMBAR DECOMPRESSION L4-5 AND L5-S1;  Surgeon: Reyes JAYSON Billing, MD;  Location: [facility];  Service: Orthopedics;  Laterality: Left;   LUMBAR LAMINECTOMY/DECOMPRESSION MICRODISCECTOMY Left 10/12/2014   Procedure: REVISION MICRO  LUMBAR/DECOMPRESSION L4-5 LEFT ;  Surgeon: Reyes Billing, MD;  Location: [facility];  Service: Orthopedics;  Laterality: Left;   NM MYOCAR PERF WALL MOTION  09/22/2008   lexiscan -EF 83%; glogal LV systolic fx is norm. ,evidence of mild ischemia basal anterior,midanterior and apical lateral region(s).    ORIF PATELLA Right 08/29/2016   Procedure: OPEN REDUCTION INTERNAL (ORIF) FIXATION RIGHT PATELLA;  Surgeon: Fidel Rogue, MD;  Location: [facility];  Service: Orthopedics;  Laterality: Right;  Adductor Block   TUBAL LIGATION     TYMPANOPLASTY Bilateral    UVULOPALATOPHARYNGOPLASTY     pt denies     Allergies  Allergies  Allergen Reactions   Crestor  [Rosuvastatin ] Other (See Comments)    myalgia   Niacin And Related Other (See Comments)    Whelps and skin flushed, mouth tingling    Home Medications    Prior to Admission medications   Medication Sig Start Date End Date Taking? Authorizing Provider  albuterol  (VENTOLIN  HFA) 108 (90 Base) MCG/ACT inhaler Inhale 2 puffs into the lungs every 6 (six) hours as needed for wheezing or shortness of breath. 08/17/23   Laurence Locus, DO  amLODipine  (NORVASC ) 10 MG tablet Take 1 tablet (10 mg total) by mouth at bedtime. 07/02/23   Copland, Harlene JAYSON, MD  aspirin  EC 81 MG EC tablet Take 1 tablet (81 mg total) by mouth daily. 08/30/16   Swinteck, Rogue, MD  benzonatate  (TESSALON ) 100 MG capsule Take 1 capsule (100 mg total) by mouth 2 (two) times daily as needed for cough. 02/28/23   Cyndi Shaver, PA-C  chlorpheniramine  (CHLOR-TRIMETON ) 4 MG tablet Take 1 tablet (4 mg total) by mouth daily. Patient taking differently: Take 4 mg by mouth daily as needed for allergies. 09/21/21   Copland, Harlene JAYSON, MD  cholecalciferol  (VITAMIN D ) 1000 UNITS tablet Take 1,000 Units by mouth daily.    [provider]  clonazePAM  (KLONOPIN ) 0.5 MG tablet Take 0.5-1 tablets (0.25-0.5 mg total) by mouth 2 (two) times daily as needed for anxiety. 09/08/23   Copland, Harlene JAYSON, MD   clopidogrel  (PLAVIX ) 75 MG tablet Take 1 tablet (75 mg total) by mouth daily. 07/02/23 07/01/24  Copland, Harlene JAYSON, MD  denosumab  (PROLIA ) 60 MG/ML SOSY injection Inject 60 mg into the skin every 6 (six) months. Dx code M81.0. pt has appt on 11/11/23 10/14/23   Copland, Harlene JAYSON, MD  ezetimibe  (ZETIA ) 10 MG tablet Take 1 tablet (10 mg total) by mouth daily. 07/02/23   Copland, Harlene JAYSON, MD  famotidine  (PEPCID ) 20 MG tablet Take 1 tablet (20 mg total) by mouth daily. 09/08/23   Copland, Harlene JAYSON, MD  fenofibrate  (TRICOR ) 145 MG tablet Take 1 tablet (145 mg) by mouth daily. 09/02/22   Hilty, Vinie JAYSON, MD  FLUoxetine  (PROZAC ) 20 MG capsule Take 1 capsule (20 mg total) by mouth daily. 07/02/23   Copland, Harlene  C, MD  fluticasone  (FLONASE ) 50 MCG/ACT nasal spray Place 1 spray into both nostrils daily as needed for allergies or rhinitis. 02/09/21   Copland, Jessica C, MD  ibuprofen (ADVIL) 200 MG tablet Take 400 mg by mouth daily as needed for mild pain (knee pain).    [provider]  icosapent  Ethyl (VASCEPA ) 1 g capsule Take 1 capsule (1 g total) by mouth 2 (two) times daily. 07/02/23   Copland, Harlene BROCKS, MD  isosorbide  mononitrate (IMDUR ) 30 MG 24 hr tablet Take 1 tablet (30 mg total) by mouth daily. 07/02/23   Copland, Jessica C, MD  Multiple Vitamin (MULTIVITAMIN WITH MINERALS) TABS tablet Take 1 tablet by mouth daily.    [provider]  nitroGLYCERIN  (NITROSTAT ) 0.4 MG SL tablet Place 1 tablet under the tongue every 5 minutes as needed for chest pain. 07/02/23   Copland, Harlene BROCKS, MD  ondansetron  (ZOFRAN ) 4 MG tablet Take 1 tablet (4 mg total) by mouth every 8 (eight) hours as needed for nausea or vomiting. 08/21/23   Copland, Harlene BROCKS, MD  polyethylene glycol (GOLYTELY ) 236 g solution Take 4000mL orally as directed. 10/23/23     RABEprazole  (ACIPHEX ) 20 MG tablet Take 1 tablet (20 mg total) by mouth daily 30 minutes prior to food 05/14/23   Copland, Jessica C, MD  simvastatin  (ZOCOR ) 40 MG  tablet Take 1 tablet (40 mg total) by mouth daily. 07/02/23   Copland, Harlene BROCKS, MD  telmisartan  (MICARDIS ) 40 MG tablet Take 1 tablet (40 mg total) by mouth daily. 04/14/23   Jerilynn Lamarr HERO, NP  rosuvastatin  (CRESTOR ) 20 MG tablet Take 1 tablet (20 mg total) by mouth daily. 12/29/19 02/27/20  Emelia Josefa HERO, NP    Physical Exam    Vital Signs:  Ammy [place] [employer] does not have vital signs available for review today.  Given telephonic nature of communication, physical exam is limited. AAOx3. NAD. Normal affect.  Speech and respirations are unlabored.  Accessory Clinical Findings    None  Assessment & Plan    1.  Preoperative Cardiovascular Risk Assessment:  Ms. Sawin's perioperative risk of a major cardiac event is 0.9% according to the Revised Cardiac Risk Index (RCRI).  Therefore, she is at low risk for perioperative complications.   Her functional capacity is good at 5.07 METs according to the Duke Activity Status Index (DASI). Recommendations: According to ACC/AHA guidelines, no further cardiovascular testing needed.  The patient may proceed to surgery at acceptable risk.   Antiplatelet and/or Anticoagulation Recommendations: The patient should remain on Aspirin  without interruption.   Clopidogrel  (Plavix ) can be held for 5 days prior to her surgery and resumed as soon as possible post op.  The patient was advised that if she develops new symptoms prior to surgery to contact our office to arrange for a follow-up visit, and she verbalized understanding.  A copy of this note will be routed to requesting surgeon.  Time:   Today, I have spent 7 minutes with the patient with telehealth technology discussing medical history, symptoms, and management plan.     Orren LOISE Fabry, PA-C  10/29/2023, 2:04 PM

## 2023-10-31 ENCOUNTER — Other Ambulatory Visit: Payer: Self-pay

## 2023-10-31 NOTE — Progress Notes (Signed)
 Specialty Pharmacy Initial Fill Coordination Note  [name] is a 73 y.o. female contacted today regarding initial fill of specialty medication(s) Denosumab  (PROLIA )   Patient requested Courier to Provider Office   Delivery date: 11/06/23   Verified address: [facility] [address]   Medication will be filled on 8/13.   Patient is aware of $250 copayment.

## 2023-11-03 ENCOUNTER — Other Ambulatory Visit (HOSPITAL_COMMUNITY): Payer: Self-pay

## 2023-11-05 ENCOUNTER — Other Ambulatory Visit: Payer: Self-pay

## 2023-11-05 ENCOUNTER — Other Ambulatory Visit (HOSPITAL_COMMUNITY): Payer: Self-pay

## 2023-11-05 DIAGNOSIS — K317 Polyp of stomach and duodenum: Secondary | ICD-10-CM | POA: Diagnosis not present

## 2023-11-05 DIAGNOSIS — K5730 Diverticulosis of large intestine without perforation or abscess without bleeding: Secondary | ICD-10-CM | POA: Diagnosis not present

## 2023-11-05 DIAGNOSIS — K219 Gastro-esophageal reflux disease without esophagitis: Secondary | ICD-10-CM | POA: Diagnosis not present

## 2023-11-05 DIAGNOSIS — K449 Diaphragmatic hernia without obstruction or gangrene: Secondary | ICD-10-CM | POA: Diagnosis not present

## 2023-11-05 DIAGNOSIS — D509 Iron deficiency anemia, unspecified: Secondary | ICD-10-CM | POA: Diagnosis not present

## 2023-11-05 DIAGNOSIS — Z1211 Encounter for screening for malignant neoplasm of colon: Secondary | ICD-10-CM | POA: Diagnosis not present

## 2023-11-10 ENCOUNTER — Ambulatory Visit (INDEPENDENT_AMBULATORY_CARE_PROVIDER_SITE_OTHER): Admitting: *Deleted

## 2023-11-10 DIAGNOSIS — Z8739 Personal history of other diseases of the musculoskeletal system and connective tissue: Secondary | ICD-10-CM

## 2023-11-10 DIAGNOSIS — M8580 Other specified disorders of bone density and structure, unspecified site: Secondary | ICD-10-CM

## 2023-11-10 MED ORDER — DENOSUMAB 60 MG/ML ~~LOC~~ SOSY: 60.0000 mg | PREFILLED_SYRINGE | SUBCUTANEOUS | Status: AC

## 2023-11-10 NOTE — Progress Notes (Signed)
 Patient here for Prolia injection per physicians orders  Prolia 60 mg SQ , was administered left arm today. Patient tolerated injection.  Patient next injection due: 6 months, appt made:   No- will schedule in 5 months after benefits are ran again  Initial injection: no  Did Prolia come from pharmacy (if yes please select patient supplied): yes  Cam placed for next injection yes

## 2023-11-11 ENCOUNTER — Encounter (HOSPITAL_COMMUNITY)
Admission: RE | Disposition: A | Discharge status: Home / Self Care | Payer: Self-pay | Source: Home / Self Care | Attending: Gastroenterology

## 2023-11-11 ENCOUNTER — Ambulatory Visit

## 2023-11-11 ENCOUNTER — Ambulatory Visit (HOSPITAL_COMMUNITY)
Admission: RE | Admit: 2023-11-11 | Discharge: 2023-11-11 | Disposition: A | Discharge status: Home / Self Care | Attending: Gastroenterology | Admitting: Gastroenterology

## 2023-11-11 DIAGNOSIS — D509 Iron deficiency anemia, unspecified: Secondary | ICD-10-CM | POA: Insufficient documentation

## 2023-11-11 HISTORY — PX: GIVENS CAPSULE STUDY: SHX5432

## 2023-11-11 LAB — HM COLONOSCOPY

## 2023-11-11 SURGERY — IMAGING PROCEDURE, GI TRACT, INTRALUMINAL, VIA CAPSULE

## 2023-11-11 SURGICAL SUPPLY — 1 items: TOWEL COTTON PACK 4EA (MISCELLANEOUS) ×2 IMPLANT

## 2023-11-11 NOTE — Progress Notes (Signed)
 [facility] capsule endoscopy ordered by MD Kristie.  Patient ingested capsule at 0824am.  Per Given's capsule instructions, patient to remain NPO until 1024am at which time they may progress to clear liquid diet. At 1224pm patient may have a small snack such as a half a sandwich or a bowl of soup. At 1624 patient may progress to previously ordered diet.  The capsule endoscopy study will conclude at 1624 at which time the recorder and leads or belt can be removed and placed in a patient belongings bag. Patient instructed to bring bag to admitting desk by 5pm or bring at 8am 8/20.

## 2023-11-12 ENCOUNTER — Ambulatory Visit: Payer: Self-pay

## 2023-11-12 ENCOUNTER — Ambulatory Visit: Admitting: Family Medicine

## 2023-11-12 ENCOUNTER — Encounter: Payer: Self-pay | Admitting: Family Medicine

## 2023-11-12 ENCOUNTER — Other Ambulatory Visit (HOSPITAL_BASED_OUTPATIENT_CLINIC_OR_DEPARTMENT_OTHER): Payer: Self-pay

## 2023-11-12 VITALS — BP 129/52 | HR 64 | Temp 97.7°F | Ht 62.0 in | Wt 195.0 lb

## 2023-11-12 DIAGNOSIS — J0140 Acute pansinusitis, unspecified: Secondary | ICD-10-CM | POA: Diagnosis not present

## 2023-11-12 DIAGNOSIS — W19XXXA Unspecified fall, initial encounter: Secondary | ICD-10-CM

## 2023-11-12 DIAGNOSIS — R0789 Other chest pain: Secondary | ICD-10-CM

## 2023-11-12 DIAGNOSIS — M25561 Pain in right knee: Secondary | ICD-10-CM

## 2023-11-12 MED ORDER — AMOXICILLIN-POT CLAVULANATE 875-125 MG PO TABS
1.0000 | ORAL_TABLET | Freq: Two times a day (BID) | ORAL | 0 refills | Status: AC
  Filled 2023-11-12: qty 20, 10d supply, fill #0

## 2023-11-12 MED ORDER — AMOXICILLIN-POT CLAVULANATE 875-125 MG PO TABS
1.0000 | ORAL_TABLET | Freq: Two times a day (BID) | ORAL | 0 refills | Status: DC
  Filled 2023-11-12: qty 20, 10d supply, fill #0

## 2023-11-12 NOTE — Telephone Encounter (Signed)
              Copied from CRM [id number]. Topic: Clinical - Red Word Triage >> Nov 12, 2023  8:10 AM [place] E wrote: [facility] that prompted transfer to Nurse Triage: Discolored mucous. Patient stated she is having a cough that is producing a green mucous. Reason for Disposition . Coughing up rusty-colored (reddish-brown) sputum  Answer Assessment - Initial Assessment Questions 1. ONSET: When did the cough begin?      Couple months ago 2. SEVERITY: How bad is the cough today?      mild 3. SPUTUM: Describe the color of your sputum (e.g., none, dry cough; clear, white, yellow, green)     green 4. HEMOPTYSIS: Are you coughing up any blood? If Yes, ask: How much? (e.g., flecks, streaks, tablespoons, etc.)     denies 5. DIFFICULTY BREATHING: Are you having difficulty breathing? If Yes, ask: How bad is it? (e.g., mild, moderate, severe)      no 6. FEVER: Do you have a fever? If Yes, ask: What is your temperature, how was it measured, and when did it start?     no 7. CARDIAC HISTORY: Do you have any history of heart disease? (e.g., heart attack, congestive heart failure)      Stent placement 8. LUNG HISTORY: Do you have any history of lung disease?  (e.g., pulmonary embolus, asthma, emphysema)     Hx pneumonia 9. PE RISK FACTORS: Do you have a history of blood clots? (or: recent major surgery, recent prolonged travel, bedridden)     denies 10. OTHER SYMPTOMS: Do you have any other symptoms? (e.g., runny nose, wheezing, chest pain)       no 11. PREGNANCY: Is there any chance you are pregnant? When was your last menstrual period?       no 12. TRAVEL: Have you traveled out of the country in the last month? (e.g., travel history, exposures)       no  Protocols used: Cough - Acute Productive-A-AH

## 2023-11-12 NOTE — Progress Notes (Signed)
 Acute Office Visit  Subjective:     Patient ID: [name], female    DOB: 11/20/1950, 73 y.o.   MRN: [id number]  Chief Complaint  Patient presents with   Cough     Patient is in today for URI.   Discussed the use of AI scribe software for clinical note transcription with the patient, who gave verbal consent to proceed.  History of Present Illness [name] is a 73 year old female who presents with sinus/ear pressure, persistent cough and mucus production.  She reports a persistent cough with thick, green mucus production since the end of May. She was hospitalized for one or two nights at [facility] and received antibiotics intravenously. After discharge, she completed a course of azithromycin . PCP repeated CXR in July which showed resolution. Despite these treatments, she continues to have episodes of coughing and sneezing that produce dark green, thick mucus. She reports that she feels like her sinuses and ears are full. She has not had any significant dyspnea, though her ribs feel a little sore with breathing (due to the coughing) she does not have pain on deep breathing.   She has a history of frequent ear infections as a child, which required multiple surgeries, including a procedure where her eardrums were patched with her own tissue.   Recently, she experienced a fall while coming back into the house after planting tomatoes. She stumbled on the door frame and fell, hitting her right knee and ribs, which are now tender and knee is stiff. She can move normally and has not noticed any bruising or swelling. She has been icing it a little.  Currently, she is taking Mucinex , Robitussin, and Tylenol , and she drinks water and green tea regularly.        All review of systems negative except what is listed in the HPI      Objective:    BP (!) 129/52   Pulse 64   Temp 97.7 F (36.5 C) (Oral)   Ht 5' 2 (1.575 m)   Wt 195 lb (88.5 kg)   LMP  (LMP Unknown)   SpO2  96%   BMI 35.67 kg/m    Physical Exam Vitals reviewed.  Constitutional:      Appearance: Normal appearance.  HENT:     Head: Normocephalic and atraumatic.     Right Ear: Tympanic membrane is not erythematous or bulging.     Left Ear: Tympanic membrane is not erythematous or bulging.     Mouth/Throat:     Mouth: Mucous membranes are moist.  Cardiovascular:     Rate and Rhythm: Normal rate and regular rhythm.  Pulmonary:     Effort: Pulmonary effort is normal.     Breath sounds: Normal breath sounds. No wheezing, rhonchi or rales.  Skin:    General: Skin is warm and dry.     Capillary Refill: Capillary refill takes less than 2 seconds.  Neurological:     Mental Status: She is alert and oriented to person, place, and time.  Psychiatric:        Mood and Affect: Mood normal.        Behavior: Behavior normal.        Thought Content: Thought content normal.        Judgment: Judgment normal.     No results found for any visits on 11/12/23.      Assessment & Plan:   Problem List Items Addressed This Visit   None Visit  Diagnoses       Acute non-recurrent pansinusitis    -  Primary   Relevant Medications   amoxicillin -clavulanate (AUGMENTIN ) 875-125 MG tablet     Fall, initial encounter          Assessment & Plan Acute sinusitis with persistent cough and green sputum Persistent coughing and sneezing with thick, dark green sputum likely due to sinus drainage. No fever or significant respiratory distress. Clear lung sounds and stable vitals reduce concern for pneumonia.  - Augmentin . Take with food and water. Take a multi-strain probiotic for the next month (at least 2 hours apart from antibiotic dose).   Continue Mucinex  and hydration. Flonase  nasal spray would also be good to try.   Continue supportive measures including rest, hydration, humidifier use, steam showers, warm compresses to sinuses, warm liquids with lemon and honey, and over-the-counter cough, cold, and  analgesics as needed.     Recent fall with localized pain and tenderness Localized pain and tenderness to right side/ribs and knee. Mildly tender on exam, but no bruising or swelling, normal ROM. - Recommend ice, rest, heat - Declined xrays at this time. We can order if symptoms do not gradually improve over the next few days.  Patient aware of signs/symptoms requiring further/urgent evaluation.      Meds ordered this encounter  Medications   DISCONTD: amoxicillin -clavulanate (AUGMENTIN ) 875-125 MG tablet    Sig: Take 1 tablet by mouth 2 (two) times daily for 10 days.    Dispense:  20 tablet    Refill:  0    Supervising Provider:   DOMENICA BLACKBIRD A [[id number]]   amoxicillin -clavulanate (AUGMENTIN ) 875-125 MG tablet    Sig: Take 1 tablet by mouth 2 (two) times daily for 10 days.    Dispense:  20 tablet    Refill:  0    Supervising Provider:   DOMENICA BLACKBIRD A [[id number]]    Return if symptoms worsen or fail to improve.  Waddell KATHEE Mon, NP

## 2023-11-12 NOTE — Telephone Encounter (Signed)
 FYI Only or Action Required?: FYI only for provider.  Patient was last seen in primary care on 10/01/2023 by Copland, Harlene BROCKS, MD.  Called Nurse Triage reporting Cough.  Symptoms began several months ago.  Interventions attempted: Nothing.  Symptoms are: unchanged.  Triage Disposition: See Physician Within 24 Hours  Patient/caregiver understands and will follow disposition?: Yes

## 2023-11-12 NOTE — Patient Instructions (Signed)
 Given duration of sinus symptoms, will treat with antibiotics - Augmentin . Take with food and water. Take a multi-strain probiotic for the next month (at least 2 hours apart from antibiotic dose).   Continue Mucinex  and hydration. Flonase  nasal spray would also be good to try.   Continue supportive measures including rest, hydration, humidifier use, steam showers, warm compresses to sinuses, warm liquids with lemon and honey, and over-the-counter cough, cold, and analgesics as needed.

## 2023-11-14 ENCOUNTER — Other Ambulatory Visit: Payer: Self-pay

## 2023-11-14 ENCOUNTER — Other Ambulatory Visit: Payer: Self-pay | Admitting: Physician Assistant

## 2023-11-14 ENCOUNTER — Other Ambulatory Visit: Payer: Self-pay | Admitting: Family Medicine

## 2023-11-14 ENCOUNTER — Other Ambulatory Visit (HOSPITAL_COMMUNITY): Payer: Self-pay

## 2023-11-14 ENCOUNTER — Other Ambulatory Visit (HOSPITAL_BASED_OUTPATIENT_CLINIC_OR_DEPARTMENT_OTHER): Payer: Self-pay

## 2023-11-14 DIAGNOSIS — R251 Tremor, unspecified: Secondary | ICD-10-CM

## 2023-11-14 DIAGNOSIS — B9689 Other specified bacterial agents as the cause of diseases classified elsewhere: Secondary | ICD-10-CM

## 2023-11-14 DIAGNOSIS — J189 Pneumonia, unspecified organism: Secondary | ICD-10-CM

## 2023-11-14 MED ORDER — ONDANSETRON HCL 4 MG PO TABS
4.0000 mg | ORAL_TABLET | Freq: Three times a day (TID) | ORAL | 0 refills | Status: AC | PRN
  Filled 2023-11-14: qty 20, 7d supply, fill #0

## 2023-11-14 MED ORDER — CLONAZEPAM 0.5 MG PO TABS
0.2500 mg | ORAL_TABLET | Freq: Two times a day (BID) | ORAL | 1 refills | Status: DC | PRN
  Filled 2023-11-14: qty 30, 15d supply, fill #0
  Filled 2023-12-26 – 2024-01-12 (×3): qty 30, 15d supply, fill #1

## 2023-11-17 ENCOUNTER — Other Ambulatory Visit: Payer: Self-pay

## 2023-11-26 ENCOUNTER — Other Ambulatory Visit (HOSPITAL_BASED_OUTPATIENT_CLINIC_OR_DEPARTMENT_OTHER): Payer: Self-pay

## 2023-11-26 DIAGNOSIS — D509 Iron deficiency anemia, unspecified: Secondary | ICD-10-CM | POA: Diagnosis not present

## 2023-12-02 DIAGNOSIS — K449 Diaphragmatic hernia without obstruction or gangrene: Secondary | ICD-10-CM | POA: Diagnosis not present

## 2023-12-02 DIAGNOSIS — K5904 Chronic idiopathic constipation: Secondary | ICD-10-CM | POA: Diagnosis not present

## 2023-12-02 DIAGNOSIS — K219 Gastro-esophageal reflux disease without esophagitis: Secondary | ICD-10-CM | POA: Diagnosis not present

## 2023-12-02 DIAGNOSIS — D509 Iron deficiency anemia, unspecified: Secondary | ICD-10-CM | POA: Diagnosis not present

## 2023-12-15 ENCOUNTER — Telehealth: Payer: Self-pay | Admitting: Pharmacist

## 2023-12-15 NOTE — Progress Notes (Signed)
 Pharmacy Quality Measure Review  This patient is appearing on a report for being at risk of failing the adherence measure for cholesterol (statin) and hypertension (ACEi/ARB) medications this calendar year.   Medication: simvastatin   Last fill date: 07/02/2023 for 90 day supply  Medication: telmisartan   Last fill date: 07/08/2023 for 90 day supply  Reviewed recent refill history in Dr Annemarie database. Actual last refill date was correct from adherence report. Patient has 3 refills remaining on her simvastatin  prescription and 2 refills remaining on her . Next appointment with PCP is 01/05/2024.    Left voicemail for patient to return my call at their convenience CB# [phone] and sent MyChart message to patient.  Madelin Ray, PharmD Clinical Pharmacist [facility]  Population Health [phone]

## 2023-12-25 ENCOUNTER — Other Ambulatory Visit (HOSPITAL_COMMUNITY): Payer: Self-pay

## 2023-12-25 ENCOUNTER — Other Ambulatory Visit: Payer: Self-pay

## 2023-12-26 ENCOUNTER — Other Ambulatory Visit (HOSPITAL_COMMUNITY): Payer: Self-pay

## 2023-12-26 ENCOUNTER — Other Ambulatory Visit: Payer: Self-pay

## 2023-12-30 ENCOUNTER — Ambulatory Visit

## 2023-12-31 ENCOUNTER — Other Ambulatory Visit (HOSPITAL_COMMUNITY): Payer: Self-pay

## 2024-01-01 NOTE — Progress Notes (Deleted)
 [facility] [address] [place], [place] [address] [phone]  Date:  01/05/2024   Name:  [name]   DOB:  12/08/1950   MRN:  [id number]  PCP:  Watt Harlene BROCKS, MD    Chief Complaint: No chief complaint on file.   History of Present Illness:  [name] is a 73 y.o. very pleasant female patient who presents with the following:  Patient seen today for periodic follow-up.  I saw her most recently in July History of hypertension, CAD, diabetes, sleep apnea, obesity, osteopenia with elevated fracture risk on fosamax  Status post PCI 2010 and 2014 She was seen by neurology this past winter for episodes of tremulousness and memory loss The thinking is that her episodic tremors/aphasia and stuttering are likely due to anxiety and possibly a panic attack She last saw cardiology in [facility] with DES stent to prox. LAD in 2010 with residual disease in LCX 10-20% and RCA 20% lesion. She also has HTN, DM, dyslipidemia and obesity. Most recent cardiac cath 2022 showed Previously placed Prox LAD to Mid LAD stent (unknown type) is widely patent.  At our visit in July patient and her husband were concerned about her memory.  I placed a neuropsychological evaluation referral earlier in the summer but did not see any movement on the referral at that time.  I redid this referral and I do see a letter regarding referral to patient in the chart, but no appointment is made as of yet  We also are monitoring her iron and hemoglobin; labs in July showed improvement.  She was able to decrease frequency of iron supplement She did a capsule endoscopy per Dr. Kristie in August Per my understanding this was normal, recommendation for continued monitoring and iron supplementation  Flu shot Recommend COVID booster this fall Can update A1c if desired-diabetes is currently diet controlled  Amlodipine  10 Aspirin  81 Plavix   75 Zetia  Vascepa  Simvastatin  40 Telmisartan  40 Imdur  30 Clonazepam  as needed   Discussed the use of AI scribe software for clinical note transcription with the patient, who gave verbal consent to proceed. History of Present Illness     Patient Active Problem List   Diagnosis Date Noted   Sepsis due to pneumonia (HCC) 08/16/2023   Acute on chronic anemia 08/16/2023   Primary osteoarthritis of right knee 08/30/2021   Cervical radiculopathy 08/16/2021   Heartburn 01/30/2021   Chronic rhinitis 01/30/2021   DOE (dyspnea on exertion) 11/09/2020   Obstructive sleep apnea treated with continuous positive airway pressure (CPAP) 05/07/2018   Coronary artery disease involving native coronary artery of native heart with unstable angina pectoris (HCC) 11/03/2017   Insomnia 11/03/2017   Inadequate sleep hygiene 11/03/2017   Anxiety 07/21/2017   Acquired foot deformity, left 07/18/2016   Osteopenia 11/10/2015   HNP (herniated nucleus pulposus), lumbar 10/12/2014   Spinal stenosis at L4-L5 level 10/12/2014   Iron deficiency anemia 07/29/2014   Type 2 diabetes mellitus (HCC) 07/29/2014   Environmental and seasonal allergies 04/28/2014   Spinal stenosis, lumbar region, with neurogenic claudication 01/06/2013   Obesity, Class II, BMI 35-39.9 10/20/2012   CAD S/P percutaneous coronary angioplasty    Hyperlipidemia associated with type 2 diabetes mellitus (HCC)    Hypertension associated with diabetes (HCC) 04/16/2012    Past Medical History:  Diagnosis Date   Anemia    Arthritis    Bronchitis    hx of  CAD (coronary artery disease)    a. s/p DES to LAD in 2010 with patent stent by cath in 2014 b. low-risk NST in 11/2016   Cataracts, bilateral    Diabetes mellitus without complication (HCC)    Family history of adverse reaction to anesthesia    pts mother had difficulty awakening    GERD (gastroesophageal reflux disease)    Hyperlipidemia LDL goal < 70    Hypertension    Lumbar  back pain    Numbness    left leg and foot    Plantar fascia rupture    Left Foot   Sleep apnea    uses CPAP     Past Surgical History:  Procedure Laterality Date   ABDOMINAL HYSTERECTOMY     BACK SURGERY     BREAST CYST ASPIRATION  1995   CAROTID STENT  2009   pt denies    CORONARY ANGIOPLASTY WITH STENT PLACEMENT  2010and 10-02-2012   Stent DES, Xience to prox. LAD   DOPPLER ECHOCARDIOGRAPHY  08/01/2009   EF=>55%,LV normal   GIVENS CAPSULE STUDY N/A 11/11/2023   Procedure: IMAGING PROCEDURE, GI TRACT, INTRALUMINAL, VIA CAPSULE;  Surgeon: Kristie Lamprey, MD;  Location: [facility] ENDOSCOPY;  Service: Gastroenterology;  Laterality: N/A;   LEFT HEART CATH AND CORONARY ANGIOGRAPHY N/A 12/10/2019   Procedure: LEFT HEART CATH AND CORONARY ANGIOGRAPHY;  Surgeon: Claudene Victory ORN, MD;  Location: [facility] INVASIVE CV LAB;  Service: Cardiovascular;  Laterality: N/A;   LEFT HEART CATH AND CORONARY ANGIOGRAPHY N/A 10/30/2020   Procedure: LEFT HEART CATH AND CORONARY ANGIOGRAPHY;  Surgeon: Court Dorn PARAS, MD;  Location: [facility] INVASIVE CV LAB;  Service: Cardiovascular;  Laterality: N/A;   LEFT HEART CATHETERIZATION WITH CORONARY ANGIOGRAM N/A 10/02/2012   Procedure: LEFT HEART CATHETERIZATION WITH CORONARY ANGIOGRAM;  Surgeon: Dorn PARAS Court, MD;  Location: [facility] CATH LAB;  Service: Cardiovascular;  Laterality: N/A;   lower arterial duplex  06/20/10   abi's normal,rgt 0.98,lft 1.06;bilateral PVRs normal   LUMBAR LAMINECTOMY/DECOMPRESSION MICRODISCECTOMY Left 01/06/2013   Procedure: MICRO LUMBAR DECOMPRESSION L4-5 AND L5-S1;  Surgeon: Reyes JAYSON Billing, MD;  Location: [facility] ORS;  Service: Orthopedics;  Laterality: Left;   LUMBAR LAMINECTOMY/DECOMPRESSION MICRODISCECTOMY Left 10/12/2014   Procedure: REVISION MICRO LUMBAR/DECOMPRESSION L4-5 LEFT ;  Surgeon: Reyes Billing, MD;  Location: [facility] ORS;  Service: Orthopedics;  Laterality: Left;   NM MYOCAR PERF WALL MOTION  09/22/2008   lexiscan -EF 83%; glogal LV systolic fx is norm.  ,evidence of mild ischemia basal anterior,midanterior and apical lateral region(s).    ORIF PATELLA Right 08/29/2016   Procedure: OPEN REDUCTION INTERNAL (ORIF) FIXATION RIGHT PATELLA;  Surgeon: Fidel Rogue, MD;  Location: [facility] ORS;  Service: Orthopedics;  Laterality: Right;  Adductor Block   TUBAL LIGATION     TYMPANOPLASTY Bilateral    UVULOPALATOPHARYNGOPLASTY     pt denies     Social History   Tobacco Use   Smoking status: Never   Smokeless tobacco: Never  Vaping Use   Vaping status: Never Used  Substance Use Topics   Alcohol use: Yes    Comment: occasional, 1 a month wine   Drug use: No    Family History  Problem Relation Age of Onset   Coronary artery disease Mother    Rheum arthritis Mother    Dementia Mother    Heart attack Father    Hypertension Father    Hyperlipidemia Father    Other Father        MVA   Hypertension Brother  Cancer Brother    Heart disease Brother    Cancer Paternal Grandmother        stomach   Diabetes Paternal Grandfather    Breast cancer Neg Hx     Allergies  Allergen Reactions   Crestor  [Rosuvastatin ] Other (See Comments)    myalgia   Niacin And Related Other (See Comments)    Whelps and skin flushed, mouth tingling    Medication list has been reviewed and updated.  Current Outpatient Medications on File Prior to Visit  Medication Sig Dispense Refill   albuterol  (VENTOLIN  HFA) 108 (90 Base) MCG/ACT inhaler Inhale 2 puffs into the lungs every 6 (six) hours as needed for wheezing or shortness of breath. 8 g 0   amLODipine  (NORVASC ) 10 MG tablet Take 1 tablet (10 mg total) by mouth at bedtime. 90 tablet 3   aspirin  EC 81 MG EC tablet Take 1 tablet (81 mg total) by mouth daily. 30 tablet 1   benzonatate  (TESSALON ) 100 MG capsule Take 1 capsule (100 mg total) by mouth 2 (two) times daily as needed for cough. 20 capsule 0   cholecalciferol  (VITAMIN D ) 1000 UNITS tablet Take 1,000 Units by mouth daily.     clonazePAM  (KLONOPIN ) 0.5  MG tablet Take 0.5-1 tablets (0.25-0.5 mg total) by mouth 2 (two) times daily as needed for anxiety. 30 tablet 1   clopidogrel  (PLAVIX ) 75 MG tablet Take 1 tablet (75 mg total) by mouth daily. 90 tablet 3   ezetimibe  (ZETIA ) 10 MG tablet Take 1 tablet (10 mg total) by mouth daily. 90 tablet 3   famotidine  (PEPCID ) 20 MG tablet Take 1 tablet (20 mg total) by mouth daily. 90 tablet 0   fenofibrate  (TRICOR ) 145 MG tablet Take 1 tablet (145 mg) by mouth daily. 90 tablet 3   FLUoxetine  (PROZAC ) 20 MG capsule Take 1 capsule (20 mg total) by mouth daily. 90 capsule 3   fluticasone  (FLONASE ) 50 MCG/ACT nasal spray Place 1 spray into both nostrils daily as needed for allergies or rhinitis. 16 g 11   ibuprofen (ADVIL) 200 MG tablet Take 400 mg by mouth daily as needed for mild pain (knee pain).     icosapent  Ethyl (VASCEPA ) 1 g capsule Take 1 capsule (1 g total) by mouth 2 (two) times daily. 180 capsule 3   isosorbide  mononitrate (IMDUR ) 30 MG 24 hr tablet Take 1 tablet (30 mg total) by mouth daily. 90 tablet 3   Multiple Vitamin (MULTIVITAMIN WITH MINERALS) TABS tablet Take 1 tablet by mouth daily.     nitroGLYCERIN  (NITROSTAT ) 0.4 MG SL tablet Place 1 tablet under the tongue every 5 minutes as needed for chest pain. 25 tablet 2   ondansetron  (ZOFRAN ) 4 MG tablet Take 1 tablet (4 mg total) by mouth every 8 (eight) hours as needed for nausea or vomiting. 20 tablet 0   RABEprazole  (ACIPHEX ) 20 MG tablet Take 1 tablet (20 mg total) by mouth daily 30 minutes prior to food 90 tablet 3   simvastatin  (ZOCOR ) 40 MG tablet Take 1 tablet (40 mg total) by mouth daily. 90 tablet 3   telmisartan  (MICARDIS ) 40 MG tablet Take 1 tablet (40 mg total) by mouth daily. 90 tablet 3   [DISCONTINUED] rosuvastatin  (CRESTOR ) 20 MG tablet Take 1 tablet (20 mg total) by mouth daily. 30 tablet 3   Current Facility-Administered Medications on File Prior to Visit  Medication Dose Route Frequency Provider Last Rate Last Admin    denosumab  (PROLIA ) injection 60 mg  60 mg Subcutaneous Q6 months Zyrell Carmean C, MD   60 mg at 11/10/23 0923   [START ON 05/08/2024] denosumab  (PROLIA ) injection 60 mg  60 mg Subcutaneous Q6 months Valon Glasscock, Harlene BROCKS, MD        Review of Systems:  As per HPI- otherwise negative.   Physical Examination: There were no vitals filed for this visit. There were no vitals filed for this visit. There is no height or weight on file to calculate BMI. Ideal Body Weight:    GEN: no acute distress. HEENT: Atraumatic, Normocephalic.  Ears and Nose: No external deformity. CV: RRR, No M/G/R. No JVD. No thrill. No extra heart sounds. PULM: CTA B, no wheezes, crackles, rhonchi. No retractions. No resp. distress. No accessory muscle use. ABD: S, NT, ND, +BS. No rebound. No HSM. EXTR: No c/c/e PSYCH: Normally interactive. Conversant.    Assessment and Plan: No diagnosis found.  Assessment & Plan   Signed Harlene Schroeder, MD

## 2024-01-01 NOTE — Patient Instructions (Incomplete)
It was great to see you again today!  

## 2024-01-05 ENCOUNTER — Ambulatory Visit: Admitting: Family Medicine

## 2024-01-05 ENCOUNTER — Other Ambulatory Visit: Payer: Self-pay

## 2024-01-05 ENCOUNTER — Other Ambulatory Visit (HOSPITAL_COMMUNITY): Payer: Self-pay

## 2024-01-12 ENCOUNTER — Other Ambulatory Visit (HOSPITAL_COMMUNITY): Payer: Self-pay

## 2024-01-13 ENCOUNTER — Telehealth: Payer: Self-pay

## 2024-01-13 ENCOUNTER — Other Ambulatory Visit (HOSPITAL_COMMUNITY): Payer: Self-pay

## 2024-01-13 NOTE — Telephone Encounter (Signed)
 Patient called in today advising that she has been seen by our office in the past for memory loss (LS 2024) and would like to transfer her care from Dr Margaret to Dr Chalice - who has seen her for sleep issues in the past.   Please advise if this is acceptable .

## 2024-01-22 ENCOUNTER — Telehealth: Payer: Self-pay

## 2024-01-22 NOTE — Telephone Encounter (Signed)
 Copied from CRM [id number]. Topic: Appointments - Scheduling Inquiry for Clinic >> Jan 22, 2024 12:03 PM Laymon HERO wrote: Reason for CRM: Patient needing to schedule for a Prolia  shot. Please contact Patient

## 2024-01-26 ENCOUNTER — Ambulatory Visit: Admitting: Family Medicine

## 2024-01-28 NOTE — Telephone Encounter (Signed)
 Spoke with patient and advised her that she had her injection back in August and she is not due until Feb 2026.  FYI- pt wanted to let Dr. Watt know about her forgetting things.    She has an appointment next week to discuss this.

## 2024-01-29 NOTE — Telephone Encounter (Signed)
 Heard back from Dr Chalice and she is declining the switch  I called and advised patient of same

## 2024-02-01 NOTE — Patient Instructions (Incomplete)
 It was great to see you today, I will be in touch with your labs Recommend doing a COVID booster this fall, recommend 1 dose of RSV if not done already

## 2024-02-01 NOTE — Progress Notes (Addendum)
 [employer], [employer] [facility] [employer] [address], [address] [place], [place] [address] [phone]  Date:  02/04/2024   Name:  [name]   DOB:  July 22, 1950   MRN:  [id number]  PCP:  Watt Harlene BROCKS, MD    Chief Complaint: Follow-up (Memory issues onset over a year /I would like to start a medication for memory loss )   History of Present Illness:  [name] is a 73 y.o. very pleasant female patient who presents with the following:  Patient seen today for medication follow-up.  I saw her most recently in July In May of this year she was admitted briefly with pneumonia There is also been some recent concern about her memory.  There is a referral letter in the chart from July regarding referral to see Heron Sprague, PhD for neuropsychological evaluation  History of hypertension, CAD, diabetes, sleep apnea, obesity, osteopenia with elevated fracture risk on fosamax  Status post PCI 2010 and 2014 She was seen by neurology this past winter for episodes of tremulousness and memory loss The thinking is that her episodic tremors/aphasia and stuttering are likely due to anxiety and possibly a panic attack She last saw cardiology in [facility] with DES stent to prox. LAD in 2010 with residual disease in LCX 10-20% and RCA 20% lesion. She also has HTN, DM, dyslipidemia and obesity. Most recent cardiac cath 2022 showed Previously placed Prox LAD to Mid LAD stent (unknown type) is widely patent.    Dr Kristie did a capsule endoscopy for her in August, partially in response to iron deficiency-capsule endoscopy was normal We can check her iron levels today Most recent CBC in July showed improvement of anemia, hemoglobin had improved from 8.5 in May to 11.7  - Flu shot- done  -Recommend COVID booster- done -Can update A1c today  Zetia  Fenofibrate  Vascepa  Simvastatin  Fluoxetine  20 mg; I recommend increasing to 40 mg earlier this year due to increased  stress. She ended up sticking with 20 mg  Isosorbide  30 mg Plavix  Aspirin  Telmisartan  Amlodipine  10  Discussed the use of AI scribe software for clinical note transcription with the patient, who gave verbal consent to proceed.  History of Present Illness [name] is a 73 year old female who presents with memory concerns. She is accompanied by her husband, who assists with her care.  She has been experiencing ongoing memory issues, characterized by repeating questions after a few minutes. Despite these concerns, she is able to perform daily activities such as cooking, housework, and grocery shopping. She has not yet been evaluated by a specialist for these memory concerns.  I did put in a referral for her to see Dr. Heron Sprague earlier in the year for neuropsychiatric testing but it looks like the referral did not end up going forward  There is a family history of memory loss, as both of her parents experienced it in their seventies. Her father was on Aricept  for memory issues  She is currently taking fluoxetine  20 mg daily for mood, which she feels is effective as she describes herself as 'happy'. She enjoys listening to music and dancing in her bedroom  Her husband recently had knee surgery and has been experiencing swelling and pain in the knee. He has been using hydrocodone  for pain management but prefers Valium  for his knee pain. He received a cortisone shot two weeks ago, which he feels was helpful.  She has completed her vaccinations,  including pneumonia, COVID, and flu shots.   Patient Active Problem List   Diagnosis Date Noted   Sepsis due to pneumonia (HCC) 08/16/2023   Acute on chronic anemia 08/16/2023   Primary osteoarthritis of right knee 08/30/2021   Cervical radiculopathy 08/16/2021   Heartburn 01/30/2021   Chronic rhinitis 01/30/2021   DOE (dyspnea on exertion) 11/09/2020   Obstructive sleep apnea treated with continuous positive airway pressure (CPAP) 05/07/2018    Coronary artery disease involving native coronary artery of native heart with unstable angina pectoris (HCC) 11/03/2017   Insomnia 11/03/2017   Inadequate sleep hygiene 11/03/2017   Anxiety 07/21/2017   Acquired foot deformity, left 07/18/2016   Osteopenia 11/10/2015   HNP (herniated nucleus pulposus), lumbar 10/12/2014   Spinal stenosis at L4-L5 level 10/12/2014   Iron deficiency anemia 07/29/2014   Type 2 diabetes mellitus (HCC) 07/29/2014   Environmental and seasonal allergies 04/28/2014   Spinal stenosis, lumbar region, with neurogenic claudication 01/06/2013   Obesity, Class II, BMI 35-39.9 10/20/2012   CAD S/P percutaneous coronary angioplasty    Hyperlipidemia associated with type 2 diabetes mellitus (HCC)    Hypertension associated with diabetes (HCC) 04/16/2012    Past Medical History:  Diagnosis Date   Anemia    Arthritis    Bronchitis    hx of    CAD (coronary artery disease)    a. s/p DES to LAD in 2010 with patent stent by cath in 2014 b. low-risk NST in 11/2016   Cataracts, bilateral    Diabetes mellitus without complication (HCC)    Family history of adverse reaction to anesthesia    pts mother had difficulty awakening    GERD (gastroesophageal reflux disease)    Hyperlipidemia LDL goal < 70    Hypertension    Lumbar back pain    Numbness    left leg and foot    Plantar fascia rupture    Left Foot   Sleep apnea    uses CPAP     Past Surgical History:  Procedure Laterality Date   ABDOMINAL HYSTERECTOMY     BACK SURGERY     BREAST CYST ASPIRATION  1995   CAROTID STENT  2009   pt denies    CORONARY ANGIOPLASTY WITH STENT PLACEMENT  2010and 10-02-2012   Stent DES, Xience to prox. LAD   DOPPLER ECHOCARDIOGRAPHY  08/01/2009   EF=>55%,LV normal   GIVENS CAPSULE STUDY N/A 11/11/2023   Procedure: IMAGING PROCEDURE, GI TRACT, INTRALUMINAL, VIA CAPSULE;  Surgeon: Kristie Lamprey, MD;  Location: [facility] ENDOSCOPY;  Service: Gastroenterology;  Laterality: N/A;   LEFT  HEART CATH AND CORONARY ANGIOGRAPHY N/A 12/10/2019   Procedure: LEFT HEART CATH AND CORONARY ANGIOGRAPHY;  Surgeon: Claudene Victory ORN, MD;  Location: [facility] INVASIVE CV LAB;  Service: Cardiovascular;  Laterality: N/A;   LEFT HEART CATH AND CORONARY ANGIOGRAPHY N/A 10/30/2020   Procedure: LEFT HEART CATH AND CORONARY ANGIOGRAPHY;  Surgeon: Court Dorn PARAS, MD;  Location: [facility] INVASIVE CV LAB;  Service: Cardiovascular;  Laterality: N/A;   LEFT HEART CATHETERIZATION WITH CORONARY ANGIOGRAM N/A 10/02/2012   Procedure: LEFT HEART CATHETERIZATION WITH CORONARY ANGIOGRAM;  Surgeon: Dorn PARAS Court, MD;  Location[facility] CATH LAB;  Service: Cardiovascular;  Laterality: N/A;   lower arterial duplex  06/20/10   abi's normal,rgt 0.98,lft 1.06;bilateral PVRs normal   LUMBAR LAMINECTOMY/DECOMPRESSION MICRODISCECTOMY Left 01/06/2013   Procedure: MICRO LUMBAR DECOMPRESSION L4-5 AND L5-S1;  Surgeon: Reyes JAYSON Billing, MD;  Location: [facility] ORS;  Service: Orthopedics;  Laterality: Left;  LUMBAR LAMINECTOMY/DECOMPRESSION MICRODISCECTOMY Left 10/12/2014   Procedure: REVISION MICRO LUMBAR/DECOMPRESSION L4-5 LEFT ;  Surgeon: Reyes Billing, MD;  Location: [facility] ORS;  Service: Orthopedics;  Laterality: Left;   NM MYOCAR PERF WALL MOTION  09/22/2008   lexiscan -EF 83%; glogal LV systolic fx is norm. ,evidence of mild ischemia basal anterior,midanterior and apical lateral region(s).    ORIF PATELLA Right 08/29/2016   Procedure: OPEN REDUCTION INTERNAL (ORIF) FIXATION RIGHT PATELLA;  Surgeon: Fidel Rogue, MD;  Location: [facility] ORS;  Service: Orthopedics;  Laterality: Right;  Adductor Block   TUBAL LIGATION     TYMPANOPLASTY Bilateral    UVULOPALATOPHARYNGOPLASTY     pt denies     Social History   Tobacco Use   Smoking status: Never   Smokeless tobacco: Never  Vaping Use   Vaping status: Never Used  Substance Use Topics   Alcohol use: Yes    Comment: occasional, 1 a month wine   Drug use: No    Family History  Problem Relation Age  of Onset   Coronary artery disease Mother    Rheum arthritis Mother    Dementia Mother    Heart attack Father    Hypertension Father    Hyperlipidemia Father    Other Father        MVA   Hypertension Brother    Cancer Brother    Heart disease Brother    Cancer Paternal Grandmother        stomach   Diabetes Paternal Grandfather    Breast cancer Neg Hx     Allergies  Allergen Reactions   Crestor  [Rosuvastatin ] Other (See Comments)    myalgia   Niacin And Related Other (See Comments)    Whelps and skin flushed, mouth tingling    Medication list has been reviewed and updated.  Current Outpatient Medications on File Prior to Visit  Medication Sig Dispense Refill   albuterol  (VENTOLIN  HFA) 108 (90 Base) MCG/ACT inhaler Inhale 2 puffs into the lungs every 6 (six) hours as needed for wheezing or shortness of breath. 8 g 0   amLODipine  (NORVASC ) 10 MG tablet Take 1 tablet (10 mg total) by mouth at bedtime. 90 tablet 3   aspirin  EC 81 MG EC tablet Take 1 tablet (81 mg total) by mouth daily. 30 tablet 1   benzonatate  (TESSALON ) 100 MG capsule Take 1 capsule (100 mg total) by mouth 2 (two) times daily as needed for cough. 20 capsule 0   cholecalciferol  (VITAMIN D ) 1000 UNITS tablet Take 1,000 Units by mouth daily.     clonazePAM  (KLONOPIN ) 0.5 MG tablet Take 0.5-1 tablets (0.25-0.5 mg total) by mouth 2 (two) times daily as needed for anxiety. 30 tablet 1   clopidogrel  (PLAVIX ) 75 MG tablet Take 1 tablet (75 mg total) by mouth daily. 90 tablet 3   ezetimibe  (ZETIA ) 10 MG tablet Take 1 tablet (10 mg total) by mouth daily. 90 tablet 3   famotidine  (PEPCID ) 20 MG tablet Take 1 tablet (20 mg total) by mouth daily. 90 tablet 0   fenofibrate  (TRICOR ) 145 MG tablet Take 1 tablet (145 mg) by mouth daily. 90 tablet 3   FLUoxetine  (PROZAC ) 20 MG capsule Take 1 capsule (20 mg total) by mouth daily. 90 capsule 3   fluticasone  (FLONASE ) 50 MCG/ACT nasal spray Place 1 spray into both nostrils daily as  needed for allergies or rhinitis. 16 g 11   ibuprofen (ADVIL) 200 MG tablet Take 400 mg by mouth daily as needed for mild pain (  knee pain).     icosapent  Ethyl (VASCEPA ) 1 g capsule Take 1 capsule (1 g total) by mouth 2 (two) times daily. 180 capsule 3   isosorbide  mononitrate (IMDUR ) 30 MG 24 hr tablet Take 1 tablet (30 mg total) by mouth daily. 90 tablet 3   Multiple Vitamin (MULTIVITAMIN WITH MINERALS) TABS tablet Take 1 tablet by mouth daily.     nitroGLYCERIN  (NITROSTAT ) 0.4 MG SL tablet Place 1 tablet under the tongue every 5 minutes as needed for chest pain. 25 tablet 2   ondansetron  (ZOFRAN ) 4 MG tablet Take 1 tablet (4 mg total) by mouth every 8 (eight) hours as needed for nausea or vomiting. 20 tablet 0   RABEprazole  (ACIPHEX ) 20 MG tablet Take 1 tablet (20 mg total) by mouth daily 30 minutes prior to food 90 tablet 3   simvastatin  (ZOCOR ) 40 MG tablet Take 1 tablet (40 mg total) by mouth daily. 90 tablet 3   telmisartan  (MICARDIS ) 40 MG tablet Take 1 tablet (40 mg total) by mouth daily. 90 tablet 3   [DISCONTINUED] rosuvastatin  (CRESTOR ) 20 MG tablet Take 1 tablet (20 mg total) by mouth daily. 30 tablet 3   Current Facility-Administered Medications on File Prior to Visit  Medication Dose Route Frequency Provider Last Rate Last Admin   denosumab  (PROLIA ) injection 60 mg  60 mg Subcutaneous Q6 months Doak Mah C, MD   60 mg at 11/10/23 0923   [START ON 05/08/2024] denosumab  (PROLIA ) injection 60 mg  60 mg Subcutaneous Q6 months Kriston Pasquarello, Harlene BROCKS, MD        Review of Systems:  As per HPI- otherwise negative.   Physical Examination: Vitals:   02/04/24 0849  BP: 128/70  Pulse: (!) 51  SpO2: 99%   Vitals:   02/04/24 0849  Weight: 199 lb 12.8 oz (90.6 kg)  Height: 5' 2 (1.575 m)   Body mass index is 36.54 kg/m. Ideal Body Weight: Weight in (lb) to have BMI = 25: 136.4  GEN: no acute distress.  Obese, looks well and her normal self HEENT: Atraumatic, Normocephalic.   Ears and Nose: No external deformity. CV: RRR, No M/G/R. No JVD. No thrill. No extra heart sounds. PULM: CTA B, no wheezes, crackles, rhonchi. No retractions. No resp. distress. No accessory muscle use. ABD: S, NT, ND, +BS. No rebound. No HSM. EXTR: No c/c/e PSYCH: Normally interactive. Conversant.    Assessment and Plan: Dyslipidemia - Plan: Lipid panel  Iron deficiency anemia, unspecified iron deficiency anemia type - Plan: CBC, Ferritin  Memory change - Plan: TSH, donepezil  (ARICEPT ) 5 MG tablet  Coronary artery disease of native artery of native heart with stable angina pectoris - Plan: Comprehensive metabolic panel with GFR, Hemoglobin A1c, Lipid panel  Essential hypertension - Plan: CBC, Comprehensive metabolic panel with GFR  Hyperlipidemia associated with type 2 diabetes mellitus (HCC) - Plan: Lipid panel  Type 2 diabetes mellitus with other specified complication, without long-term current use of insulin  (HCC) - Plan: Hemoglobin A1c  Assessment & Plan Memory loss Concerns about memory loss with family history. Discussed Aricept  for symptom management.  They would like to give this a try - Provided contact for Dr. Heron Sprague for neuropsychiatric evaluation. - Prescribed Aricept  5 mg daily, 90-day supply.  Can increase to 10 mg in a few weeks if desired  Follow-up on diabetes and lipid control, blood pressure well-controlled on current medication  Signed Harlene Schroeder, MD  Received labs as below, message to patient Results for orders placed  or performed in visit on 02/04/24  CBC   Collection Time: 02/04/24  9:19 AM  Result Value Ref Range   WBC 7.6 4.0 - 10.5 K/uL   RBC 4.53 3.87 - 5.11 Mil/uL   Platelets 276.0 150.0 - 400.0 K/uL   Hemoglobin 11.7 (L) 12.0 - 15.0 g/dL   HCT 63.5 63.9 - 53.9 %   MCV 80.4 78.0 - 100.0 fl   MCHC 32.2 30.0 - 36.0 g/dL   RDW 84.9 88.4 - 84.4 %  Comprehensive metabolic panel with GFR   Collection Time: 02/04/24  9:19 AM  Result  Value Ref Range   Sodium 139 135 - 145 mEq/L   Potassium 4.6 3.5 - 5.1 mEq/L   Chloride 104 96 - 112 mEq/L   CO2 28 19 - 32 mEq/L   Glucose, Bld 172 (H) 70 - 99 mg/dL   BUN 17 6 - 23 mg/dL   Creatinine, Ser 9.02 0.40 - 1.20 mg/dL   Total Bilirubin 0.4 0.2 - 1.2 mg/dL   Alkaline Phosphatase 47 39 - 117 U/L   AST 15 0 - 37 U/L   ALT 17 0 - 35 U/L   Total Protein 6.6 6.0 - 8.3 g/dL   Albumin 4.2 3.5 - 5.2 g/dL   GFR 42.34 (L) >39.99 mL/min   Calcium  9.4 8.4 - 10.5 mg/dL  Hemoglobin J8r   Collection Time: 02/04/24  9:19 AM  Result Value Ref Range   Hgb A1c MFr Bld 8.6 (H) 4.6 - 6.5 %  Lipid panel   Collection Time: 02/04/24  9:19 AM  Result Value Ref Range   Cholesterol 175 0 - 200 mg/dL   Triglycerides 821.9 (H) 0.0 - 149.0 mg/dL   HDL 56.89 >60.99 mg/dL   VLDL 64.3 0.0 - 59.9 mg/dL   LDL Cholesterol 96 0 - 99 mg/dL   Total CHOL/HDL Ratio 4    NonHDL 131.84   Ferritin   Collection Time: 02/04/24  9:19 AM  Result Value Ref Range   Ferritin 47.4 10.0 - 291.0 ng/mL  TSH   Collection Time: 02/04/24  9:19 AM  Result Value Ref Range   TSH 3.03 0.35 - 5.50 uIU/mL   *Note: Due to a large number of results and/or encounters for the requested time period, some results have not been displayed. A complete set of results can be found in Results Review.    "

## 2024-02-04 ENCOUNTER — Other Ambulatory Visit (HOSPITAL_BASED_OUTPATIENT_CLINIC_OR_DEPARTMENT_OTHER): Payer: Self-pay

## 2024-02-04 ENCOUNTER — Other Ambulatory Visit: Payer: Self-pay

## 2024-02-04 ENCOUNTER — Encounter: Payer: Self-pay | Admitting: Family Medicine

## 2024-02-04 ENCOUNTER — Other Ambulatory Visit (HOSPITAL_COMMUNITY): Payer: Self-pay

## 2024-02-04 ENCOUNTER — Ambulatory Visit (INDEPENDENT_AMBULATORY_CARE_PROVIDER_SITE_OTHER): Admitting: Family Medicine

## 2024-02-04 VITALS — BP 128/70 | HR 51 | Ht 62.0 in | Wt 199.8 lb

## 2024-02-04 DIAGNOSIS — E785 Hyperlipidemia, unspecified: Secondary | ICD-10-CM | POA: Diagnosis not present

## 2024-02-04 DIAGNOSIS — I25118 Atherosclerotic heart disease of native coronary artery with other forms of angina pectoris: Secondary | ICD-10-CM

## 2024-02-04 DIAGNOSIS — E1169 Type 2 diabetes mellitus with other specified complication: Secondary | ICD-10-CM | POA: Diagnosis not present

## 2024-02-04 DIAGNOSIS — I10 Essential (primary) hypertension: Secondary | ICD-10-CM | POA: Diagnosis not present

## 2024-02-04 DIAGNOSIS — R413 Other amnesia: Secondary | ICD-10-CM

## 2024-02-04 DIAGNOSIS — D509 Iron deficiency anemia, unspecified: Secondary | ICD-10-CM

## 2024-02-04 LAB — COMPREHENSIVE METABOLIC PANEL WITH GFR
ALT: 17 U/L (ref 0–35)
AST: 15 U/L (ref 0–37)
Albumin: 4.2 g/dL (ref 3.5–5.2)
Alkaline Phosphatase: 47 U/L (ref 39–117)
BUN: 17 mg/dL (ref 6–23)
CO2: 28 meq/L (ref 19–32)
Calcium: 9.4 mg/dL (ref 8.4–10.5)
Chloride: 104 meq/L (ref 96–112)
Creatinine, Ser: 0.97 mg/dL (ref 0.40–1.20)
GFR: 57.65 mL/min — ABNORMAL LOW (ref >60.00)
Glucose, Bld: 172 mg/dL — ABNORMAL HIGH (ref 70–99)
Potassium: 4.6 meq/L (ref 3.5–5.1)
Sodium: 139 meq/L (ref 135–145)
Total Bilirubin: 0.4 mg/dL (ref 0.2–1.2)
Total Protein: 6.6 g/dL (ref 6.0–8.3)

## 2024-02-04 LAB — CBC
HCT: 36.4 % (ref 36.0–46.0)
Hemoglobin: 11.7 g/dL — ABNORMAL LOW (ref 12.0–15.0)
MCHC: 32.2 g/dL (ref 30.0–36.0)
MCV: 80.4 fl (ref 78.0–100.0)
Platelets: 276 K/uL (ref 150.0–400.0)
RBC: 4.53 Mil/uL (ref 3.87–5.11)
RDW: 15 % (ref 11.5–15.5)
WBC: 7.6 K/uL (ref 4.0–10.5)

## 2024-02-04 LAB — LIPID PANEL
Cholesterol: 175 mg/dL (ref 0–200)
HDL: 43.1 mg/dL (ref >39.00)
LDL Cholesterol: 96 mg/dL (ref 0–99)
NonHDL: 131.84
Total CHOL/HDL Ratio: 4
Triglycerides: 178 mg/dL — ABNORMAL HIGH (ref 0.0–149.0)
VLDL: 35.6 mg/dL (ref 0.0–40.0)

## 2024-02-04 LAB — HEMOGLOBIN A1C: Hgb A1c MFr Bld: 8.6 % — ABNORMAL HIGH (ref 4.6–6.5)

## 2024-02-04 LAB — TSH: TSH: 3.03 u[IU]/mL (ref 0.35–5.50)

## 2024-02-04 LAB — FERRITIN: Ferritin: 47.4 ng/mL (ref 10.0–291.0)

## 2024-02-04 MED ORDER — DONEPEZIL HCL 5 MG PO TABS
5.0000 mg | ORAL_TABLET | Freq: Every day | ORAL | 3 refills | Status: AC
  Filled 2024-02-04 (×2): qty 90, 90d supply, fill #0
  Filled 2024-04-28: qty 30, 30d supply, fill #1

## 2024-02-05 ENCOUNTER — Other Ambulatory Visit: Payer: Self-pay | Admitting: Family Medicine

## 2024-02-05 ENCOUNTER — Encounter: Payer: Self-pay | Admitting: Family Medicine

## 2024-02-05 DIAGNOSIS — R413 Other amnesia: Secondary | ICD-10-CM

## 2024-02-06 ENCOUNTER — Encounter: Payer: Self-pay | Admitting: Family Medicine

## 2024-02-06 ENCOUNTER — Other Ambulatory Visit: Payer: Self-pay | Admitting: Family Medicine

## 2024-02-06 ENCOUNTER — Telehealth: Payer: Self-pay | Admitting: Family Medicine

## 2024-02-06 DIAGNOSIS — R413 Other amnesia: Secondary | ICD-10-CM

## 2024-02-06 NOTE — Telephone Encounter (Signed)
 Copied from CRM #[id number]. Topic: Medicare AWV >> Feb 06, 2024  9:25 AM Nathanel DEL wrote: Called LVM 02/06/2024 to schedule AWV. Please schedule office or virtual visits  Nathanel Paschal; Care Guide Ambulatory Clinical Support [place] [facility] Direct Dial: [phone]

## 2024-02-13 ENCOUNTER — Emergency Department (HOSPITAL_COMMUNITY)

## 2024-02-13 ENCOUNTER — Encounter (HOSPITAL_COMMUNITY): Payer: Self-pay

## 2024-02-13 ENCOUNTER — Other Ambulatory Visit: Payer: Self-pay

## 2024-02-13 ENCOUNTER — Ambulatory Visit: Payer: Self-pay

## 2024-02-13 ENCOUNTER — Observation Stay (HOSPITAL_COMMUNITY)
Admission: EM | Admit: 2024-02-13 | Discharge: 2024-02-13 | Disposition: A | Discharge status: Home / Self Care | Attending: Emergency Medicine | Admitting: Emergency Medicine

## 2024-02-13 DIAGNOSIS — E1169 Type 2 diabetes mellitus with other specified complication: Secondary | ICD-10-CM | POA: Diagnosis not present

## 2024-02-13 DIAGNOSIS — K760 Fatty (change of) liver, not elsewhere classified: Secondary | ICD-10-CM | POA: Diagnosis not present

## 2024-02-13 DIAGNOSIS — D509 Iron deficiency anemia, unspecified: Secondary | ICD-10-CM | POA: Diagnosis present

## 2024-02-13 DIAGNOSIS — M48061 Spinal stenosis, lumbar region without neurogenic claudication: Secondary | ICD-10-CM | POA: Diagnosis not present

## 2024-02-13 DIAGNOSIS — Z7982 Long term (current) use of aspirin: Secondary | ICD-10-CM | POA: Insufficient documentation

## 2024-02-13 DIAGNOSIS — E785 Hyperlipidemia, unspecified: Secondary | ICD-10-CM | POA: Diagnosis not present

## 2024-02-13 DIAGNOSIS — I10 Essential (primary) hypertension: Secondary | ICD-10-CM | POA: Insufficient documentation

## 2024-02-13 DIAGNOSIS — E1165 Type 2 diabetes mellitus with hyperglycemia: Secondary | ICD-10-CM | POA: Diagnosis not present

## 2024-02-13 DIAGNOSIS — R0789 Other chest pain: Secondary | ICD-10-CM | POA: Diagnosis not present

## 2024-02-13 DIAGNOSIS — I200 Unstable angina: Principal | ICD-10-CM

## 2024-02-13 DIAGNOSIS — E66812 Obesity, class 2: Secondary | ICD-10-CM | POA: Diagnosis not present

## 2024-02-13 DIAGNOSIS — Z794 Long term (current) use of insulin: Secondary | ICD-10-CM | POA: Insufficient documentation

## 2024-02-13 DIAGNOSIS — Z79899 Other long term (current) drug therapy: Secondary | ICD-10-CM | POA: Insufficient documentation

## 2024-02-13 DIAGNOSIS — I2510 Atherosclerotic heart disease of native coronary artery without angina pectoris: Secondary | ICD-10-CM | POA: Diagnosis not present

## 2024-02-13 DIAGNOSIS — E1159 Type 2 diabetes mellitus with other circulatory complications: Secondary | ICD-10-CM | POA: Diagnosis not present

## 2024-02-13 DIAGNOSIS — G4733 Obstructive sleep apnea (adult) (pediatric): Secondary | ICD-10-CM | POA: Diagnosis not present

## 2024-02-13 DIAGNOSIS — I152 Hypertension secondary to endocrine disorders: Secondary | ICD-10-CM | POA: Diagnosis not present

## 2024-02-13 DIAGNOSIS — E119 Type 2 diabetes mellitus without complications: Secondary | ICD-10-CM | POA: Diagnosis not present

## 2024-02-13 DIAGNOSIS — F419 Anxiety disorder, unspecified: Secondary | ICD-10-CM | POA: Diagnosis present

## 2024-02-13 DIAGNOSIS — M48062 Spinal stenosis, lumbar region with neurogenic claudication: Secondary | ICD-10-CM | POA: Diagnosis present

## 2024-02-13 DIAGNOSIS — I25110 Atherosclerotic heart disease of native coronary artery with unstable angina pectoris: Secondary | ICD-10-CM | POA: Diagnosis present

## 2024-02-13 DIAGNOSIS — R079 Chest pain, unspecified: Principal | ICD-10-CM | POA: Diagnosis present

## 2024-02-13 DIAGNOSIS — I774 Celiac artery compression syndrome: Secondary | ICD-10-CM | POA: Diagnosis not present

## 2024-02-13 DIAGNOSIS — I700 Atherosclerosis of aorta: Secondary | ICD-10-CM | POA: Diagnosis not present

## 2024-02-13 DIAGNOSIS — R001 Bradycardia, unspecified: Secondary | ICD-10-CM | POA: Diagnosis not present

## 2024-02-13 LAB — I-STAT CHEM 8, ED
BUN: 16 mg/dL (ref 8–23)
Calcium, Ion: 1.13 mmol/L — ABNORMAL LOW (ref 1.15–1.40)
Chloride: 108 mmol/L (ref 98–111)
Creatinine, Ser: 0.9 mg/dL (ref 0.44–1.00)
Glucose, Bld: 129 mg/dL — ABNORMAL HIGH (ref 70–99)
HCT: 34 % — ABNORMAL LOW (ref 36.0–46.0)
Hemoglobin: 11.6 g/dL — ABNORMAL LOW (ref 12.0–15.0)
Potassium: 4.3 mmol/L (ref 3.5–5.1)
Sodium: 141 mmol/L (ref 135–145)
TCO2: 24 mmol/L (ref 22–32)

## 2024-02-13 LAB — COMPREHENSIVE METABOLIC PANEL WITH GFR
ALT: 22 U/L (ref 0–44)
AST: 28 U/L (ref 15–41)
Albumin: 3.9 g/dL (ref 3.5–5.0)
Alkaline Phosphatase: 49 U/L (ref 38–126)
Anion gap: 11 (ref 5–15)
BUN: 14 mg/dL (ref 8–23)
CO2: 22 mmol/L (ref 22–32)
Calcium: 9.3 mg/dL (ref 8.9–10.3)
Chloride: 105 mmol/L (ref 98–111)
Creatinine, Ser: 0.93 mg/dL (ref 0.44–1.00)
GFR, Estimated: >60 mL/min (ref >60)
Glucose, Bld: 139 mg/dL — ABNORMAL HIGH (ref 70–99)
Potassium: 4.1 mmol/L (ref 3.5–5.1)
Sodium: 138 mmol/L (ref 135–145)
Total Bilirubin: 0.5 mg/dL (ref 0.0–1.2)
Total Protein: 6.9 g/dL (ref 6.5–8.1)

## 2024-02-13 LAB — CBC WITH DIFFERENTIAL/PLATELET
Abs Immature Granulocytes: 0.04 K/uL (ref 0.00–0.07)
Basophils Absolute: 0.1 K/uL (ref 0.0–0.1)
Basophils Relative: 1 %
Eosinophils Absolute: 0.4 K/uL (ref 0.0–0.5)
Eosinophils Relative: 4 %
HCT: 38.2 % (ref 36.0–46.0)
Hemoglobin: 12.2 g/dL (ref 12.0–15.0)
Immature Granulocytes: 0 %
Lymphocytes Relative: 50 %
Lymphs Abs: 5.3 K/uL — ABNORMAL HIGH (ref 0.7–4.0)
MCH: 25.9 pg — ABNORMAL LOW (ref 26.0–34.0)
MCHC: 31.9 g/dL (ref 30.0–36.0)
MCV: 81.1 fL (ref 80.0–100.0)
Monocytes Absolute: 0.7 K/uL (ref 0.1–1.0)
Monocytes Relative: 7 %
Neutro Abs: 3.9 K/uL (ref 1.7–7.7)
Neutrophils Relative %: 38 %
Platelets: 301 K/uL (ref 150–400)
RBC: 4.71 MIL/uL (ref 3.87–5.11)
RDW: 14.6 % (ref 11.5–15.5)
WBC: 10.5 K/uL (ref 4.0–10.5)
nRBC: 0 % (ref 0.0–0.2)

## 2024-02-13 LAB — TROPONIN I (HIGH SENSITIVITY)
Troponin I (High Sensitivity): 9 ng/L (ref <18)
Troponin I (High Sensitivity): 11 ng/L (ref <18)

## 2024-02-13 MED ORDER — DONEPEZIL HCL 10 MG PO TABS: 5.0000 mg | ORAL_TABLET | Freq: Every day | ORAL | Status: DC

## 2024-02-13 MED ORDER — ACETAMINOPHEN 325 MG PO TABS: 650.0000 mg | ORAL_TABLET | ORAL | Status: DC | PRN

## 2024-02-13 MED ORDER — CLONAZEPAM 0.5 MG PO TABS: 0.5000 mg | ORAL_TABLET | Freq: Two times a day (BID) | ORAL | Status: DC | PRN

## 2024-02-13 MED ORDER — FAMOTIDINE 20 MG PO TABS: 20.0000 mg | ORAL_TABLET | Freq: Every day | ORAL | Status: DC

## 2024-02-13 MED ORDER — SIMVASTATIN 20 MG PO TABS: 40.0000 mg | ORAL_TABLET | Freq: Every day | ORAL | Status: DC

## 2024-02-13 MED ORDER — CLONAZEPAM 0.5 MG PO TABS: 0.5000 mg | ORAL_TABLET | Freq: Once | ORAL | Status: DC

## 2024-02-13 MED ORDER — HEPARIN SODIUM (PORCINE) 5000 UNIT/ML IJ SOLN: 5000.0000 [IU] | Freq: Three times a day (TID) | INTRAMUSCULAR | Status: DC

## 2024-02-13 MED ORDER — FLUOXETINE HCL 20 MG PO CAPS: 20.0000 mg | ORAL_CAPSULE | Freq: Every day | ORAL | Status: DC

## 2024-02-13 MED ORDER — IOHEXOL 350 MG/ML SOLN
80.0000 mL | Freq: Once | INTRAVENOUS | Status: AC | PRN
  Administered 2024-02-13: 80 mL via INTRAVENOUS

## 2024-02-13 MED ORDER — INSULIN ASPART 100 UNIT/ML IJ SOLN: 0.0000 [IU] | Freq: Three times a day (TID) | INTRAMUSCULAR | Status: DC

## 2024-02-13 MED ORDER — IRBESARTAN 150 MG PO TABS: 150.0000 mg | ORAL_TABLET | Freq: Every day | ORAL | Status: DC

## 2024-02-13 MED ORDER — ASPIRIN 81 MG PO TBEC: 81.0000 mg | DELAYED_RELEASE_TABLET | Freq: Every day | ORAL | Status: DC

## 2024-02-13 MED ORDER — ASPIRIN 81 MG PO CHEW
324.0000 mg | CHEWABLE_TABLET | Freq: Once | ORAL | Status: AC
  Administered 2024-02-13: 324 mg via ORAL
  Filled 2024-02-13: qty 4

## 2024-02-13 MED ORDER — EZETIMIBE 10 MG PO TABS: 10.0000 mg | ORAL_TABLET | Freq: Every day | ORAL | Status: DC

## 2024-02-13 MED ORDER — ALBUTEROL SULFATE (2.5 MG/3ML) 0.083% IN NEBU: 2.5000 mg | INHALATION_SOLUTION | Freq: Four times a day (QID) | RESPIRATORY_TRACT | Status: DC | PRN

## 2024-02-13 MED ORDER — ONDANSETRON HCL 4 MG/2ML IJ SOLN
4.0000 mg | Freq: Four times a day (QID) | INTRAMUSCULAR | Status: DC | PRN
Start: 2024-02-13 — End: 2024-02-14

## 2024-02-13 MED ORDER — CLOPIDOGREL BISULFATE 75 MG PO TABS: 75.0000 mg | ORAL_TABLET | Freq: Every day | ORAL | Status: DC

## 2024-02-13 NOTE — Discharge Summary (Signed)
 Physician Discharge Summary   Patient: [name] MRN: [id number] DOB: [birth year]  Admit date:     02/13/2024  Discharge date: 02/13/24  Discharge Physician: Marsa KATHEE Scurry   PCP: Watt Harlene BROCKS, MD   Recommendations at discharge:   Follow-up with cardiology outpatient.  Discharge Diagnoses: Principal Problem:   Chest pain, rule out acute myocardial infarction Active Problems:   Hypertension associated with diabetes (HCC)   Hyperlipidemia associated with type 2 diabetes mellitus (HCC)   Obesity, Class II, BMI 35-39.9   Iron deficiency anemia   Type 2 diabetes mellitus (HCC)   Anxiety   Obstructive sleep apnea treated with continuous positive airway pressure (CPAP)   Spinal stenosis, lumbar region, with neurogenic claudication   Coronary artery disease involving native coronary artery of native heart with unstable angina pectoris (HCC)  Resolved Problems:   * No resolved hospital problems. [facility] Course: No notes on file  Assessment and Plan: No notes have been filed under this hospital service. Service: Hospitalist  Patient presented earlier today with chest pain in the history of CAD status post stent.  Cardiology been consulted and does not team was subsequently consulted for admission with cardiology consult.  Plan was for chest pain rule out.  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 > Troponin negative x2. Patient reported anxiety component.   > Cardiology evaluated the patient and determined patient's pain was noncardiac. Patient cleared for discharge without additional testing at time of discharge. - Patient to be discharged with cardiology follow-up  Incidental celiac stenosis. > Noted on CTA imaging.  Discussed with on-call vascular surgeon who reviewed imaging but did not formally consult.  Findings consistent with chronic stenosis with compensatory blood flow  from other arteries. - Outpatient follow-up with vascular surgery.  Consultants: Cardiology Procedures performed: None Disposition: Home Diet recommendation:  Cardiac diet DISCHARGE MEDICATION: Allergies as of 02/13/2024       Reactions   Crestor  [rosuvastatin ] Other (See Comments)   myalgia   Niacin And Related Other (See Comments)   Whelps and skin flushed, mouth tingling        Medication List     TAKE these medications    albuterol  108 (90 Base) MCG/ACT inhaler Commonly known as: VENTOLIN  HFA Inhale 2 puffs into the lungs every 6 (six) hours as needed for wheezing or shortness of breath.   amLODipine  10 MG tablet Commonly known as: NORVASC  Take 1 tablet (10 mg total) by mouth at bedtime.   aspirin  EC 81 MG tablet Take 1 tablet (81 mg total) by mouth daily.   benzonatate  100 MG capsule Commonly known as: TESSALON  Take 1 capsule (100 mg total) by mouth 2 (two) times daily as needed for cough.   cholecalciferol  1000 units tablet Commonly known as: VITAMIN D  Take 1,000 Units by mouth daily.   clonazePAM  0.5 MG tablet Commonly known as: KLONOPIN  Take 0.5-1 tablets (0.25-0.5 mg total) by mouth 2 (two) times daily as needed for anxiety.   clopidogrel  75 MG tablet Commonly known as: PLAVIX  Take 1 tablet (75 mg total) by mouth daily.   donepezil  5 MG tablet Commonly known as: ARICEPT  Take 1 tablet (5 mg total) by mouth at bedtime.   ezetimibe  10 MG tablet Commonly known as: ZETIA  Take 1 tablet (10 mg total) by mouth daily.   famotidine  20 MG tablet Commonly known as: PEPCID  Take 1 tablet (20 mg total) by mouth daily.  fenofibrate  145 MG tablet Commonly known as: TRICOR  Take 1 tablet (145 mg) by mouth daily.   FLUoxetine  20 MG capsule Commonly known as: PROZAC  Take 1 capsule (20 mg total) by mouth daily.   fluticasone  50 MCG/ACT nasal spray Commonly known as: FLONASE  Place 1 spray into both nostrils daily as needed for allergies or rhinitis.    ibuprofen 200 MG tablet Commonly known as: ADVIL Take 400 mg by mouth daily as needed for mild pain (knee pain).   icosapent  Ethyl 1 g capsule Commonly known as: Vascepa  Take 1 capsule (1 g total) by mouth 2 (two) times daily. What changed: when to take this   isosorbide  mononitrate 30 MG 24 hr tablet Commonly known as: IMDUR  Take 1 tablet (30 mg total) by mouth daily.   metFORMIN  500 MG 24 hr tablet Commonly known as: GLUCOPHAGE -XR Take 500 mg by mouth daily with breakfast.   multivitamin with minerals Tabs tablet Take 1 tablet by mouth daily.   nitroGLYCERIN  0.4 MG SL tablet Commonly known as: NITROSTAT  Place 1 tablet under the tongue every 5 minutes as needed for chest pain.   ondansetron  4 MG tablet Commonly known as: Zofran  Take 1 tablet (4 mg total) by mouth every 8 (eight) hours as needed for nausea or vomiting.   RABEprazole  20 MG tablet Commonly known as: ACIPHEX  Take 1 tablet (20 mg total) by mouth daily 30 minutes prior to food What changed:  when to take this additional instructions   simvastatin  40 MG tablet Commonly known as: ZOCOR  Take 1 tablet (40 mg total) by mouth daily.   telmisartan  40 MG tablet Commonly known as: MICARDIS  Take 1 tablet (40 mg total) by mouth daily.   UNABLE TO FIND Take 1 each by mouth daily. Med Name: Viactive Vitamin Caramel        Discharge Exam: There were no vitals filed for this visit.  Condition at discharge: fair  The results of significant diagnostics from this hospitalization (including imaging, microbiology, ancillary and laboratory) are listed below for reference.   Imaging Studies: CT Angio Chest/Abd/Pel for Dissection W and/or Wo Contrast Result Date: 02/13/2024 EXAM: CTA CHEST, ABDOMEN AND PELVIS WITHOUT AND WITH CONTRAST 02/13/2024 02:14:00 PM TECHNIQUE: CTA of the chest was performed without and with the administration of intravenous contrast. CTA of the abdomen and pelvis was performed without and  with the administration of intravenous contrast. 80 mL of iohexol  (OMNIPAQUE ) 350 MG/ML injection was administered. Multiplanar reformatted images are provided for review. MIP images are provided for review. Automated exposure control, iterative reconstruction, and/or weight based adjustment of the mA/kV was utilized to reduce the radiation dose to as low as reasonably achievable. COMPARISON: 01/31/2018 CLINICAL HISTORY: Acute aortic syndrome (AAS) suspected; chest pain to back - numb legs. FINDINGS: VASCULATURE: AORTA: Aortic atherosclerosis. No acute finding. No abdominal aortic aneurysm. No dissection. PULMONARY ARTERIES: No pulmonary embolism with the limits of this exam. GREAT VESSELS OF AORTIC ARCH: No acute finding. No dissection. No arterial occlusion or significant stenosis. CELIAC TRUNK: There is either noted critical stenosis of the origin of celiac artery or occlusion. SUPERIOR MESENTERIC ARTERY: No acute finding. No occlusion or significant stenosis. INFERIOR MESENTERIC ARTERY: No acute finding. No occlusion or significant stenosis. RENAL ARTERIES: No acute finding. No occlusion or significant stenosis. ILIAC ARTERIES: No acute finding. No occlusion or significant stenosis. CHEST: MEDIASTINUM: Coronary artery calcifications are noted. No mediastinal lymphadenopathy. The heart and pericardium demonstrate no acute abnormality. LUNGS AND PLEURA: The lungs are without acute process.  No focal consolidation or pulmonary edema. No evidence of pleural effusion or pneumothorax. THORACIC BONES AND SOFT TISSUES: No acute bone or soft tissue abnormality. ABDOMEN AND PELVIS: LIVER: Hepatic steatosis. GALLBLADDER AND BILE DUCTS: Gallbladder is unremarkable. No biliary ductal dilatation. SPLEEN: The spleen is unremarkable. PANCREAS: The pancreas is unremarkable. ADRENAL GLANDS: Bilateral adrenal glands demonstrate no acute abnormality. KIDNEYS, URETERS AND BLADDER: No stones in the kidneys or ureters. No  hydronephrosis. No perinephric or periureteral stranding. Urinary bladder is unremarkable. GI AND BOWEL: Stomach and duodenal sweep demonstrate no acute abnormality. There is no bowel obstruction. No abnormal bowel wall thickening or distension. REPRODUCTIVE: Reproductive organs are unremarkable. PERITONEUM AND RETROPERITONEUM: No ascites or free air. LYMPH NODES: No lymphadenopathy. ABDOMINAL BONES AND SOFT TISSUES: No acute abnormality of the bones. No acute soft tissue abnormality. IMPRESSION: 1. 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 2. Hepatic steatosis. 3. No evidence of aortic aneurysm or dissection. Electronically signed by: Lynwood Seip MD 02/13/2024 02:47 PM EST RP Workstation: [id number]   DG Chest Port 1 View Result Date: 02/13/2024 EXAM: 1 VIEW(S) XRAY OF THE CHEST 02/13/2024 01:38:00 PM COMPARISON: 09/29/2023 CLINICAL HISTORY: chest pain FINDINGS: LUNGS AND PLEURA: Lungs are hypoinflated. No focal pulmonary opacity. No pleural effusion. No pneumothorax. HEART AND MEDIASTINUM: No acute abnormality of the cardiac and mediastinal silhouettes. BONES AND SOFT TISSUES: Thoracic degenerative changes. No acute osseous abnormality. IMPRESSION: 1. No acute cardiopulmonary process. 2. Low inspiratory effort with hypoinflated lungs. Electronically signed by: Waddell Calk MD 02/13/2024 02:35 PM EST RP Workstation: [id number]    Microbiology: Results for orders placed or performed during the hospital encounter of 08/16/23  Resp panel by RT-PCR (RSV, Flu A&B, Covid) Anterior Nasal Swab     Status: None   Collection Time: 08/16/23  3:42 PM   Specimen: Anterior Nasal Swab  Result Value Ref Range Status   SARS Coronavirus 2 by RT PCR NEGATIVE NEGATIVE Final    Comment: (NOTE) SARS-CoV-2 target nucleic acids are NOT DETECTED.  The SARS-CoV-2 RNA is generally detectable in upper respiratory specimens during the acute phase of infection. The lowest concentration of  SARS-CoV-2 viral copies this assay can detect is 138 copies/mL. A negative result does not preclude SARS-Cov-2 infection and should not be used as the sole basis for treatment or other patient management decisions. A negative result may occur with  improper specimen collection/handling, submission of specimen other than nasopharyngeal swab, presence of viral mutation(s) within the areas targeted by this assay, and inadequate number of viral copies(<138 copies/mL). A negative result must be combined with clinical observations, patient history, and epidemiological information. The expected result is Negative.  Fact Sheet for Patients:  bloggercourse.com  Fact Sheet for Healthcare Providers:  seriousbroker.it  This test is no t yet approved or cleared by the [place]  FDA and  has been authorized for detection and/or diagnosis of SARS-CoV-2 by FDA under an Emergency Use Authorization (EUA). This EUA will remain  in effect (meaning this test can be used) for the duration of the COVID-19 declaration under Section 564(b)(1) of the Act, 21 U.S.C.section 360bbb-3(b)(1), unless the authorization is terminated  or revoked sooner.       Influenza A by PCR NEGATIVE NEGATIVE Final   Influenza B by PCR NEGATIVE NEGATIVE Final    Comment: (NOTE) The Xpert Xpress SARS-CoV-2/FLU/RSV plus assay is intended as an aid in the diagnosis of influenza from Nasopharyngeal swab specimens and should not be used as a sole basis for treatment.  Nasal washings and aspirates are unacceptable for Xpert Xpress SARS-CoV-2/FLU/RSV testing.  Fact Sheet for Patients: bloggercourse.com  Fact Sheet for Healthcare Providers: seriousbroker.it  This test is not yet approved or cleared by the [place]  FDA and has been authorized for detection and/or diagnosis of SARS-CoV-2 by FDA under an Emergency Use  Authorization (EUA). This EUA will remain in effect (meaning this test can be used) for the duration of the COVID-19 declaration under Section 564(b)(1) of the Act, 21 U.S.C. section 360bbb-3(b)(1), unless the authorization is terminated or revoked.     Resp Syncytial Virus by PCR NEGATIVE NEGATIVE Final    Comment: (NOTE) Fact Sheet for Patients: bloggercourse.com  Fact Sheet for Healthcare Providers: seriousbroker.it  This test is not yet approved or cleared by the [place]  FDA and has been authorized for detection and/or diagnosis of SARS-CoV-2 by FDA under an Emergency Use Authorization (EUA). This EUA will remain in effect (meaning this test can be used) for the duration of the COVID-19 declaration under Section 564(b)(1) of the Act, 21 U.S.C. section 360bbb-3(b)(1), unless the authorization is terminated or revoked.  Performed at [facility], [address] [place], [place] [address]   Blood culture (routine x 2)     Status: None   Collection Time: 08/16/23  5:51 PM   Specimen: BLOOD  Result Value Ref Range Status   Specimen Description   Final    BLOOD RIGHT ANTECUBITAL Performed at [facility], [address] [place], [place] [address]    Special Requests   Final    BOTTLES DRAWN AEROBIC AND ANAEROBIC Blood Culture results may not be optimal due to an inadequate volume of blood received in culture bottles Performed at [facility], [address] [place], [place] [address]    Culture   Final    NO GROWTH 5 DAYS Performed at [facility], [address] [place], [place] [address]    Report Status 08/21/2023 FINAL  Final  Blood culture (routine x 2)     Status: None   Collection Time: 08/16/23  7:00 PM   Specimen: BLOOD RIGHT HAND  Result Value Ref Range Status   Specimen Description   Final    BLOOD RIGHT HAND Performed at [facility], [address] [place], [place] [address]    Special Requests   Final    BOTTLES DRAWN AEROBIC AND ANAEROBIC Blood Culture adequate volume Performed at [facility], [address] [place], [place] [address]    Culture   Final    NO GROWTH 5 DAYS Performed at [facility], [address] [place], [place] [address]    Report Status 08/21/2023 FINAL  Final   *Note: Due to a large number of results and/or encounters for the requested time period, some results have not been displayed. A complete set of results can be found in Results Review.    Labs: CBC: Recent Labs  Lab 02/13/24 1306 02/13/24 1337  WBC 10.5  --   NEUTROABS 3.9  --   HGB 12.2 11.6*  HCT 38.2 34.0*  MCV 81.1  --   PLT 301  --    Basic Metabolic Panel: Recent Labs  Lab 02/13/24 1306 02/13/24 1337  NA 138 141  K 4.1 4.3  CL 105 108  CO2 22  --   GLUCOSE 139* 129*  BUN 14 16  CREATININE 0.93 0.90  CALCIUM  9.3  --    Liver Function Tests: Recent Labs  Lab 02/13/24 1306  AST 28  ALT 22  ALKPHOS 49  BILITOT 0.5  PROT 6.9  ALBUMIN 3.9   CBG: No results for input(s): GLUCAP in the last 168 hours.  Discharge time spent: less than 30 minutes.  Signed: Marsa KATHEE Scurry, MD [facility] 02/13/2024

## 2024-02-13 NOTE — H&P (Addendum)
 History and Physical   [name] FMW:[id number] DOB: [birth year] DOA: 02/13/2024  PCP: Watt Harlene BROCKS, MD   Patient coming from: Home  Chief Complaint: Chest pain  HPI: [name] is a 73 y.o. female with medical history significant of hypertension, hyperlipidemia, diabetes, CAD status post stent, anxiety, obesity, OSA on CPAP, anemia, spinal stenosis presenting with chest pain and weakness.  Patient has reports that she had sudden onset of chest pain radiating to her back while watching TV last night.  Her legs felt weak and numb.  She had some improvement taking the nitroglycerin  at home.  Ended up not coming in to be evaluated yesterday.  However, when she woke this morning and tried to go to bed her symptoms were more severe and she had some associated shortness of breath.  She then presented to the ED for further evaluation. Reports some nausea.  She denies fevers, chills, constipation, diarrhea, vomiting.  Does report the pain is worse with anxiety.  Has been reports that she has ongoing issues with intermittent chest pain.  ED Course: Vital signs in ED notable for blood pressure 140s-180s systolic, heart rate in the 50s.  Lab workup included CMP with glucose 134.  CBC within normal limits.  Troponin normal with repeat pending.  Chest x-ray showed no acute Sharol but showed poor inspiration.  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  Hepatic steatosis also noted.  No evidence of aneurysm nor dissection.  Cardiology consulted in the ED and stated they will see the patient.  Review of Systems: As per HPI otherwise all other systems reviewed and are negative.  Past Medical History:  Diagnosis Date   Anemia    Arthritis    Bronchitis    hx of    CAD (coronary artery disease)    a. s/p DES to LAD in 2010 with patent stent by cath in 2014 b. low-risk NST in 11/2016   Cataracts, bilateral    Diabetes mellitus  without complication (HCC)    Family history of adverse reaction to anesthesia    pts mother had difficulty awakening    GERD (gastroesophageal reflux disease)    Hyperlipidemia LDL goal < 70    Hypertension    Lumbar back pain    Numbness    left leg and foot    Plantar fascia rupture    Left Foot   Sleep apnea    uses CPAP     Past Surgical History:  Procedure Laterality Date   ABDOMINAL HYSTERECTOMY     BACK SURGERY     BREAST CYST ASPIRATION  1995   CAROTID STENT  2009   pt denies    CORONARY ANGIOPLASTY WITH STENT PLACEMENT  2010and 10-02-2012   Stent DES, Xience to prox. LAD   DOPPLER ECHOCARDIOGRAPHY  08/01/2009   EF=>55%,LV normal   GIVENS CAPSULE STUDY N/A 11/11/2023   Procedure: IMAGING PROCEDURE, GI TRACT, INTRALUMINAL, VIA CAPSULE;  Surgeon: Kristie Lamprey, MD;  Location: [facility] ENDOSCOPY;  Service: Gastroenterology;  Laterality: N/A;   LEFT HEART CATH AND CORONARY ANGIOGRAPHY N/A 12/10/2019   Procedure: LEFT HEART CATH AND CORONARY ANGIOGRAPHY;  Surgeon: Claudene Victory ORN, MD;  Location: [facility] INVASIVE CV LAB;  Service: Cardiovascular;  Laterality: N/A;   LEFT HEART CATH AND CORONARY ANGIOGRAPHY N/A 10/30/2020   Procedure: LEFT HEART CATH AND CORONARY ANGIOGRAPHY;  Surgeon: Court Dorn JINNY, MD;  Location: [facility] INVASIVE CV LAB;  Service: Cardiovascular;  Laterality:  N/A;   LEFT HEART CATHETERIZATION WITH CORONARY ANGIOGRAM N/A 10/02/2012   Procedure: LEFT HEART CATHETERIZATION WITH CORONARY ANGIOGRAM;  Surgeon: Dorn JINNY Lesches, MD;  Location: [facility] CATH LAB;  Service: Cardiovascular;  Laterality: N/A;   lower arterial duplex  06/20/10   abi's normal,rgt 0.98,lft 1.06;bilateral PVRs normal   LUMBAR LAMINECTOMY/DECOMPRESSION MICRODISCECTOMY Left 01/06/2013   Procedure: MICRO LUMBAR DECOMPRESSION L4-5 AND L5-S1;  Surgeon: Reyes JAYSON Billing, MD;  Location: [facility] ORS;  Service: Orthopedics;  Laterality: Left;   LUMBAR LAMINECTOMY/DECOMPRESSION MICRODISCECTOMY Left 10/12/2014   Procedure: REVISION  MICRO LUMBAR/DECOMPRESSION L4-5 LEFT ;  Surgeon: Reyes Billing, MD;  Location: [facility] ORS;  Service: Orthopedics;  Laterality: Left;   NM MYOCAR PERF WALL MOTION  09/22/2008   lexiscan -EF 83%; glogal LV systolic fx is norm. ,evidence of mild ischemia basal anterior,midanterior and apical lateral region(s).    ORIF PATELLA Right 08/29/2016   Procedure: OPEN REDUCTION INTERNAL (ORIF) FIXATION RIGHT PATELLA;  Surgeon: Fidel Rogue, MD;  Location: [facility] ORS;  Service: Orthopedics;  Laterality: Right;  Adductor Block   TUBAL LIGATION     TYMPANOPLASTY Bilateral    UVULOPALATOPHARYNGOPLASTY     pt denies     Social History  reports that she has never smoked. She has never used smokeless tobacco. She reports current alcohol use. She reports that she does not use drugs.  Allergies  Allergen Reactions   Crestor  [Rosuvastatin ] Other (See Comments)    myalgia   Niacin And Related Other (See Comments)    Whelps and skin flushed, mouth tingling    Family History  Problem Relation Age of Onset   Coronary artery disease Mother    Rheum arthritis Mother    Dementia Mother    Heart attack Father    Hypertension Father    Hyperlipidemia Father    Other Father        MVA   Hypertension Brother    Cancer Brother    Heart disease Brother    Cancer Paternal Grandmother        stomach   Diabetes Paternal Grandfather    Breast cancer Neg Hx   Reviewed on admission  Prior to Admission medications   Medication Sig Start Date End Date Taking? Authorizing Provider  albuterol  (VENTOLIN  HFA) 108 (90 Base) MCG/ACT inhaler Inhale 2 puffs into the lungs every 6 (six) hours as needed for wheezing or shortness of breath. 08/17/23   Laurence Locus, DO  amLODipine  (NORVASC ) 10 MG tablet Take 1 tablet (10 mg total) by mouth at bedtime. 07/02/23   Copland, Harlene JAYSON, MD  aspirin  EC 81 MG EC tablet Take 1 tablet (81 mg total) by mouth daily. 08/30/16   Swinteck, Rogue, MD  benzonatate  (TESSALON ) 100 MG capsule Take 1 capsule  (100 mg total) by mouth 2 (two) times daily as needed for cough. 02/28/23   Cyndi Shaver, PA-C  cholecalciferol  (VITAMIN D ) 1000 UNITS tablet Take 1,000 Units by mouth daily.    [provider]  clonazePAM  (KLONOPIN ) 0.5 MG tablet Take 0.5-1 tablets (0.25-0.5 mg total) by mouth 2 (two) times daily as needed for anxiety. 11/14/23   Copland, Harlene JAYSON, MD  clopidogrel  (PLAVIX ) 75 MG tablet Take 1 tablet (75 mg total) by mouth daily. 07/02/23 07/01/24  Copland, Harlene JAYSON, MD  donepezil  (ARICEPT ) 5 MG tablet Take 1 tablet (5 mg total) by mouth at bedtime. 02/04/24   Copland, Harlene JAYSON, MD  ezetimibe  (ZETIA ) 10 MG tablet Take 1 tablet (10 mg total) by mouth daily. 07/02/23  Copland, Harlene BROCKS, MD  famotidine  (PEPCID ) 20 MG tablet Take 1 tablet (20 mg total) by mouth daily. 09/08/23   Copland, Harlene BROCKS, MD  fenofibrate  (TRICOR ) 145 MG tablet Take 1 tablet (145 mg) by mouth daily. 09/02/22   Hilty, Vinie BROCKS, MD  FLUoxetine  (PROZAC ) 20 MG capsule Take 1 capsule (20 mg total) by mouth daily. 07/02/23   Copland, Harlene BROCKS, MD  fluticasone  (FLONASE ) 50 MCG/ACT nasal spray Place 1 spray into both nostrils daily as needed for allergies or rhinitis. 02/09/21   Copland, Jessica C, MD  ibuprofen (ADVIL) 200 MG tablet Take 400 mg by mouth daily as needed for mild pain (knee pain).    [provider]  icosapent  Ethyl (VASCEPA ) 1 g capsule Take 1 capsule (1 g total) by mouth 2 (two) times daily. 07/02/23   Copland, Harlene BROCKS, MD  isosorbide  mononitrate (IMDUR ) 30 MG 24 hr tablet Take 1 tablet (30 mg total) by mouth daily. 07/02/23   Copland, Jessica C, MD  Multiple Vitamin (MULTIVITAMIN WITH MINERALS) TABS tablet Take 1 tablet by mouth daily.    [provider]  nitroGLYCERIN  (NITROSTAT ) 0.4 MG SL tablet Place 1 tablet under the tongue every 5 minutes as needed for chest pain. 07/02/23   Copland, Harlene BROCKS, MD  ondansetron  (ZOFRAN ) 4 MG tablet Take 1 tablet (4 mg total) by mouth every 8 (eight) hours as  needed for nausea or vomiting. 11/14/23   Copland, Harlene BROCKS, MD  RABEprazole  (ACIPHEX ) 20 MG tablet Take 1 tablet (20 mg total) by mouth daily 30 minutes prior to food 05/14/23   Copland, Jessica C, MD  simvastatin  (ZOCOR ) 40 MG tablet Take 1 tablet (40 mg total) by mouth daily. 07/02/23   Copland, Harlene BROCKS, MD  telmisartan  (MICARDIS ) 40 MG tablet Take 1 tablet (40 mg total) by mouth daily. 04/14/23   Jerilynn Lamarr HERO, NP  rosuvastatin  (CRESTOR ) 20 MG tablet Take 1 tablet (20 mg total) by mouth daily. 12/29/19 02/27/20  Emelia Josefa HERO, NP    Physical Exam: Vitals:   02/13/24 1330 02/13/24 1345 02/13/24 1400 02/13/24 1514  BP: (!) 147/51 (!) 141/46  107/76  Pulse: (!) 55 (!) 51 (!) 57 79  Resp: 18 15 19 18   Temp:    98.6 F (37 C)  TempSrc:    Oral  SpO2: 100% 100% 100% 92%    Physical Exam Constitutional:      General: She is not in acute distress.    Appearance: Normal appearance. She is obese.  HENT:     Head: Normocephalic and atraumatic.     Mouth/Throat:     Mouth: Mucous membranes are moist.     Pharynx: Oropharynx is clear.  Eyes:     Extraocular Movements: Extraocular movements intact.     Pupils: Pupils are equal, round, and reactive to light.  Cardiovascular:     Rate and Rhythm: Regular rhythm. Bradycardia present.     Pulses: Normal pulses.     Heart sounds: Normal heart sounds.  Pulmonary:     Effort: Pulmonary effort is normal. No respiratory distress.     Breath sounds: Normal breath sounds.  Abdominal:     General: Bowel sounds are normal. There is no distension.     Palpations: Abdomen is soft.     Tenderness: There is no abdominal tenderness.  Musculoskeletal:        General: Tenderness (Chest wall) present. No swelling or deformity.  Skin:    General: Skin is warm  and dry.  Neurological:     General: No focal deficit present.     Mental Status: Mental status is at baseline.    Labs on Admission: I have personally reviewed following labs and  imaging studies  CBC: Recent Labs  Lab 02/13/24 1306 02/13/24 1337  WBC 10.5  --   NEUTROABS 3.9  --   HGB 12.2 11.6*  HCT 38.2 34.0*  MCV 81.1  --   PLT 301  --     Basic Metabolic Panel: Recent Labs  Lab 02/13/24 1306 02/13/24 1337  NA 138 141  K 4.1 4.3  CL 105 108  CO2 22  --   GLUCOSE 139* 129*  BUN 14 16  CREATININE 0.93 0.90  CALCIUM  9.3  --     GFR: Estimated Creatinine Clearance: 57.4 mL/min (by C-G formula based on SCr of 0.9 mg/dL).  Liver Function Tests: Recent Labs  Lab 02/13/24 1306  AST 28  ALT 22  ALKPHOS 49  BILITOT 0.5  PROT 6.9  ALBUMIN 3.9    Urine analysis:    Component Value Date/Time   COLORURINE YELLOW 08/16/2023 1803   APPEARANCEUR CLOUDY (A) 08/16/2023 1803   LABSPEC 1.025 08/16/2023 1803   PHURINE 5.5 08/16/2023 1803   GLUCOSEU 100 (A) 08/16/2023 1803   HGBUR NEGATIVE 08/16/2023 1803   BILIRUBINUR NEGATIVE 08/16/2023 1803   BILIRUBINUR negative 04/17/2022 1054   KETONESUR NEGATIVE 08/16/2023 1803   PROTEINUR 100 (A) 08/16/2023 1803   UROBILINOGEN 0.2 04/17/2022 1054   UROBILINOGEN 0.2 10/01/2012 2305   NITRITE NEGATIVE 08/16/2023 1803   LEUKOCYTESUR NEGATIVE 08/16/2023 1803    Radiological Exams on Admission: CT Angio Chest/Abd/Pel for Dissection W and/or Wo Contrast Result Date: 02/13/2024 EXAM: CTA CHEST, ABDOMEN AND PELVIS WITHOUT AND WITH CONTRAST 02/13/2024 02:14:00 PM TECHNIQUE: CTA of the chest was performed without and with the administration of intravenous contrast. CTA of the abdomen and pelvis was performed without and with the administration of intravenous contrast. 80 mL of iohexol  (OMNIPAQUE ) 350 MG/ML injection was administered. Multiplanar reformatted images are provided for review. MIP images are provided for review. Automated exposure control, iterative reconstruction, and/or weight based adjustment of the mA/kV was utilized to reduce the radiation dose to as low as reasonably achievable. COMPARISON:  01/31/2018 CLINICAL HISTORY: Acute aortic syndrome (AAS) suspected; chest pain to back - numb legs. FINDINGS: VASCULATURE: AORTA: Aortic atherosclerosis. No acute finding. No abdominal aortic aneurysm. No dissection. PULMONARY ARTERIES: No pulmonary embolism with the limits of this exam. GREAT VESSELS OF AORTIC ARCH: No acute finding. No dissection. No arterial occlusion or significant stenosis. CELIAC TRUNK: There is either noted critical stenosis of the origin of celiac artery or occlusion. SUPERIOR MESENTERIC ARTERY: No acute finding. No occlusion or significant stenosis. INFERIOR MESENTERIC ARTERY: No acute finding. No occlusion or significant stenosis. RENAL ARTERIES: No acute finding. No occlusion or significant stenosis. ILIAC ARTERIES: No acute finding. No occlusion or significant stenosis. CHEST: MEDIASTINUM: Coronary artery calcifications are noted. No mediastinal lymphadenopathy. The heart and pericardium demonstrate no acute abnormality. LUNGS AND PLEURA: The lungs are without acute process. No focal consolidation or pulmonary edema. No evidence of pleural effusion or pneumothorax. THORACIC BONES AND SOFT TISSUES: No acute bone or soft tissue abnormality. ABDOMEN AND PELVIS: LIVER: Hepatic steatosis. GALLBLADDER AND BILE DUCTS: Gallbladder is unremarkable. No biliary ductal dilatation. SPLEEN: The spleen is unremarkable. PANCREAS: The pancreas is unremarkable. ADRENAL GLANDS: Bilateral adrenal glands demonstrate no acute abnormality. KIDNEYS, URETERS AND BLADDER: No stones in the kidneys  or ureters. No hydronephrosis. No perinephric or periureteral stranding. Urinary bladder is unremarkable. GI AND BOWEL: Stomach and duodenal sweep demonstrate no acute abnormality. There is no bowel obstruction. No abnormal bowel wall thickening or distension. REPRODUCTIVE: Reproductive organs are unremarkable. PERITONEUM AND RETROPERITONEUM: No ascites or free air. LYMPH NODES: No lymphadenopathy. ABDOMINAL BONES AND  SOFT TISSUES: No acute abnormality of the bones. No acute soft tissue abnormality. IMPRESSION: 1. 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 2. Hepatic steatosis. 3. No evidence of aortic aneurysm or dissection. Electronically signed by: Lynwood Seip MD 02/13/2024 02:47 PM EST RP Workstation: [id number]   DG Chest Port 1 View Result Date: 02/13/2024 EXAM: 1 VIEW(S) XRAY OF THE CHEST 02/13/2024 01:38:00 PM COMPARISON: 09/29/2023 CLINICAL HISTORY: chest pain FINDINGS: LUNGS AND PLEURA: Lungs are hypoinflated. No focal pulmonary opacity. No pleural effusion. No pneumothorax. HEART AND MEDIASTINUM: No acute abnormality of the cardiac and mediastinal silhouettes. BONES AND SOFT TISSUES: Thoracic degenerative changes. No acute osseous abnormality. IMPRESSION: 1. No acute cardiopulmonary process. 2. Low inspiratory effort with hypoinflated lungs. Electronically signed by: Waddell Calk MD 02/13/2024 02:35 PM EST RP Workstation: [id number]   EKG: Independently reviewed.  Sinus bradycardia 55 bpm.  Right bundle branch block.  Nonspecific T wave changes.  Similar to previous but rate slower.  Assessment/Plan Principal Problem:   Chest pain, rule out acute myocardial infarction Active Problems:   Hypertension associated with diabetes (HCC)   Hyperlipidemia associated with type 2 diabetes mellitus (HCC)   Obesity, Class II, BMI 35-39.9   Iron deficiency anemia   Type 2 diabetes mellitus (HCC)   Anxiety   Obstructive sleep apnea treated with continuous positive airway pressure (CPAP)   Spinal stenosis, lumbar region, with neurogenic claudication   Coronary artery disease involving native coronary artery of native heart with unstable angina pectoris (HCC)   Chest pain, Rule out ACS CAD status post stent > Known history of CAD with prior stent.  Onset of chest pain starting last night that was located in her mid upper chest radiating to her upper back.  Had some associated leg  weakness and numbness.  Some shortness of breath today. > CTA dissection study negative for dissection/aneurysm.  Did show stenosis versus occlusion of origin of celiac artery as below. > Troponin negative with repeat pending.  Chest x-ray showed no acute abnormality. > Suspect some anxiety component but the initial pain was likely real. > Patient received ASA in the ED.  Cardiology consulted and will see the patient. - Monitor on telemetry overnight - Appreciate cardiology recommendations and assistance - Echocardiogram - Continue aspirin  and Plavix  - Continue home Zetia , Statin, Imdur  - Dose of home Klonopin  to help address anxiety component.  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  No abdominal symptoms, her pain is upper chest radiating to back. - Will discuss with vascular surgery to determine if anything needs to be done while admitted - Check lactic acid ADDENDUM > Discussed with vascular surgery.  They reviewed images and changes are consistent with chronic stenosis and there are vessels that have become more dominant to make up for decrease in flow from this artery.  Recommend outpatient follow-up/referral on discharge.  Hypertension - Continue home ARB  Hyperlipidemia - Continue Zetia , Statin  Diabetes - SSI  Anxiety - Continue donepezil , fluoxetine , as needed Klonopin   Obesity - Noted  OSA - Continue home CPAP  Anemia - Hemoglobin normal in the ED  Spinal stenosis -  Noted  DVT prophylaxis: Heparin  Code Status:   Full Family Communication:  Husband   Disposition Plan:   Patient is from:  Home  Anticipated DC to:  Home  Anticipated DC date:  1 to 3 days  Anticipated DC barriers: None  Consults called:  Cardiology Admission status:  Observation, telemetry  Severity of Illness: The appropriate patient status for this patient is OBSERVATION. Observation status is judged to be reasonable and necessary in order  to provide the required intensity of service to ensure the patient's safety. The patient's presenting symptoms, physical exam findings, and initial radiographic and laboratory data in the context of their medical condition is felt to place them at decreased risk for further clinical deterioration. Furthermore, it is anticipated that the patient will be medically stable for discharge from the hospital within 2 midnights of admission.    Marsa KATHEE Scurry MD [facility]  How to contact the [facility] Attending or Consulting provider 7A - 7P or covering provider during after hours 7P -7A, for this patient?   Check the care team in [facility] and look for a) attending/consulting [facility] provider listed and b) the [facility] team listed Log into [email or web address] and use [place]'s universal password to access. If you do not have the password, please contact the hospital operator. Locate the [facility] provider you are looking for under [facility] and page to a number that you can be directly reached. If you still have difficulty reaching the provider, please page the [facility] (Director on Call) for the Hospitalists listed on amion for assistance.  02/13/2024, 4:02 PM

## 2024-02-13 NOTE — Telephone Encounter (Signed)
 FYI Only or Action Required?: FYI only for provider: ED advised and 911 called for patient.  Patient was last seen in primary care on 02/04/2024 by Copland, Harlene BROCKS, MD.  Called Nurse Triage reporting Chest Pain.  Symptoms began yesterday.  Interventions attempted: Prescription medications: Nitroglycerin  last night x 2 tablets and Rest, hydration, or home remedies.  Symptoms are: gradually worsening.  Triage Disposition: Call EMS 911 Now  Patient/caregiver understands and will follow disposition?: Yes    Copied from CRM [id number]. Topic: Clinical - Red Word Triage >> Feb 13, 2024 11:21 AM Emylou G wrote: [facility] that prompted transfer to Nurse Triage: Not feeling well.  Right hand is cold. Anxiety.  Only feels safe w/husband next to her.  Took Nitroglycerin  twice - bp went from 146/81 to 124/67 Reason for Disposition  Sounds like a life-threatening emergency to the triager  Answer Assessment - Initial Assessment Questions Heaviness in chest last night Confusion sometimes per patient Patient states she has anxiety Blood sugar 200  Patient sounded short of breath during triage  Blood pressure 146/81 and patient took a nitroglycerin  and then her blood pressure was 124/67 then patient took a second nitroglycerin  129/67 Patient felt dizzy when she woke up today at 11am 176/82 at 11am today 02/13/2024  Patient stated: Right arm feels heavy  Right leg feels numb  At this time, this RN advised the patient that the ER was recommended and this RN called 911 for the patient and remained on the phone with her until EMS arrived  Called 911 at 11:33am Patient's husband states that the patient seems to be shaking Patient is responding & alert at this time Patient states she has a stent in place This RN stayed on the phone with the patient until Emergency Services arrived EMS arrived at patient at 11:40am and patient advised this RN that they were there so call was disconnected at  this time  Protocols used: Chest Pain-A-AH

## 2024-02-13 NOTE — ED Provider Notes (Signed)
 [place] EMERGENCY DEPARTMENT AT [facility] Provider Note   CSN: [id number] Arrival date & time: 02/13/24  1229     Patient presents with: Chest Pain   [name] is a 73 y.o. female.    Chest Pain    This patient is a 73 year old female, history of multiple medical problems including hypertension, history of coronary disease status post stenting of the LAD, history of high cholesterol, she has followed with cardiology including Dr. Mona who is her current primary cardiologist.  The patient was in her usual state of health last night when she was sitting on the couch watching TV, she reports that she had onset of pain in her chest that seem to radiate to her back, it made both of her legs feel weak and numb and seem to get a little bit better with nitroglycerin , her family talked her out of coming to the hospital last night but when she woke up this morning and tried to walk the symptoms intensified radiating to her back and extreme heaviness and some shortness of breath.  She presents to the hospital with these complaints, her legs feel back to normal today.  She denies any swelling of the legs, denies any fevers or chills or recent illnesses.  She does not take any anticoagulants or antiplatelet agents  Prior to Admission medications   Medication Sig Start Date End Date Taking? Authorizing Provider  albuterol  (VENTOLIN  HFA) 108 (90 Base) MCG/ACT inhaler Inhale 2 puffs into the lungs every 6 (six) hours as needed for wheezing or shortness of breath. 08/17/23   Laurence Locus, DO  amLODipine  (NORVASC ) 10 MG tablet Take 1 tablet (10 mg total) by mouth at bedtime. 07/02/23   Copland, Harlene BROCKS, MD  aspirin  EC 81 MG EC tablet Take 1 tablet (81 mg total) by mouth daily. 08/30/16   Swinteck, Redell, MD  benzonatate  (TESSALON ) 100 MG capsule Take 1 capsule (100 mg total) by mouth 2 (two) times daily as needed for cough. 02/28/23   Cyndi Shaver, PA-C  cholecalciferol  (VITAMIN D ) 1000  UNITS tablet Take 1,000 Units by mouth daily.    [provider]  clonazePAM  (KLONOPIN ) 0.5 MG tablet Take 0.5-1 tablets (0.25-0.5 mg total) by mouth 2 (two) times daily as needed for anxiety. 11/14/23   Copland, Harlene BROCKS, MD  clopidogrel  (PLAVIX ) 75 MG tablet Take 1 tablet (75 mg total) by mouth daily. 07/02/23 07/01/24  Copland, Harlene BROCKS, MD  donepezil  (ARICEPT ) 5 MG tablet Take 1 tablet (5 mg total) by mouth at bedtime. 02/04/24   Copland, Harlene BROCKS, MD  ezetimibe  (ZETIA ) 10 MG tablet Take 1 tablet (10 mg total) by mouth daily. 07/02/23   Copland, Harlene BROCKS, MD  famotidine  (PEPCID ) 20 MG tablet Take 1 tablet (20 mg total) by mouth daily. 09/08/23   Copland, Harlene BROCKS, MD  fenofibrate  (TRICOR ) 145 MG tablet Take 1 tablet (145 mg) by mouth daily. 09/02/22   Hilty, Vinie BROCKS, MD  FLUoxetine  (PROZAC ) 20 MG capsule Take 1 capsule (20 mg total) by mouth daily. 07/02/23   Copland, Harlene BROCKS, MD  fluticasone  (FLONASE ) 50 MCG/ACT nasal spray Place 1 spray into both nostrils daily as needed for allergies or rhinitis. 02/09/21   Copland, Jessica C, MD  ibuprofen (ADVIL) 200 MG tablet Take 400 mg by mouth daily as needed for mild pain (knee pain).    [provider]  icosapent  Ethyl (VASCEPA ) 1 g capsule Take 1 capsule (1 g total) by mouth 2 (two)  times daily. 07/02/23   Copland, Harlene BROCKS, MD  isosorbide  mononitrate (IMDUR ) 30 MG 24 hr tablet Take 1 tablet (30 mg total) by mouth daily. 07/02/23   Copland, Jessica C, MD  Multiple Vitamin (MULTIVITAMIN WITH MINERALS) TABS tablet Take 1 tablet by mouth daily.    [provider]  nitroGLYCERIN  (NITROSTAT ) 0.4 MG SL tablet Place 1 tablet under the tongue every 5 minutes as needed for chest pain. 07/02/23   Copland, Harlene BROCKS, MD  ondansetron  (ZOFRAN ) 4 MG tablet Take 1 tablet (4 mg total) by mouth every 8 (eight) hours as needed for nausea or vomiting. 11/14/23   Copland, Harlene BROCKS, MD  RABEprazole  (ACIPHEX ) 20 MG tablet Take 1 tablet (20 mg total) by  mouth daily 30 minutes prior to food 05/14/23   Copland, Harlene BROCKS, MD  simvastatin  (ZOCOR ) 40 MG tablet Take 1 tablet (40 mg total) by mouth daily. 07/02/23   Copland, Harlene BROCKS, MD  telmisartan  (MICARDIS ) 40 MG tablet Take 1 tablet (40 mg total) by mouth daily. 04/14/23   Jerilynn Lamarr HERO, NP  rosuvastatin  (CRESTOR ) 20 MG tablet Take 1 tablet (20 mg total) by mouth daily. 12/29/19 02/27/20  Emelia Josefa HERO, NP    Allergies: Crestor  [rosuvastatin ] and Niacin and related    Review of Systems  Cardiovascular:  Positive for chest pain.  All other systems reviewed and are negative.   Updated Vital Signs BP 107/76   Pulse 79   Temp 98.6 F (37 C) (Oral)   Resp 18   LMP  (LMP Unknown)   SpO2 92%   Physical Exam Vitals and nursing note reviewed.  Constitutional:      General: She is not in acute distress.    Appearance: She is well-developed.  HENT:     Head: Normocephalic and atraumatic.     Mouth/Throat:     Pharynx: No oropharyngeal exudate.  Eyes:     General: No scleral icterus.       Right eye: No discharge.        Left eye: No discharge.     Conjunctiva/sclera: Conjunctivae normal.     Pupils: Pupils are equal, round, and reactive to light.  Neck:     Thyroid : No thyromegaly.     Vascular: No JVD.  Cardiovascular:     Rate and Rhythm: Normal rate and regular rhythm.     Heart sounds: Normal heart sounds. No murmur heard.    No friction rub. No gallop.  Pulmonary:     Effort: Pulmonary effort is normal. No respiratory distress.     Breath sounds: Normal breath sounds. No wheezing or rales.  Abdominal:     General: Bowel sounds are normal. There is no distension.     Palpations: Abdomen is soft. There is no mass.     Tenderness: There is no abdominal tenderness.  Musculoskeletal:        General: No tenderness. Normal range of motion.     Cervical back: Normal range of motion and neck supple.     Right lower leg: No edema.     Left lower leg: No edema.   Lymphadenopathy:     Cervical: No cervical adenopathy.  Skin:    General: Skin is warm and dry.     Findings: No erythema or rash.  Neurological:     Mental Status: She is alert.     Coordination: Coordination normal.  Psychiatric:        Behavior: Behavior normal.     (  all labs ordered are listed, but only abnormal results are displayed) Labs Reviewed  CBC WITH DIFFERENTIAL/PLATELET - Abnormal; Notable for the following components:      Result Value   MCH 25.9 (*)    Lymphs Abs 5.3 (*)    All other components within normal limits  COMPREHENSIVE METABOLIC PANEL WITH GFR - Abnormal; Notable for the following components:   Glucose, Bld 139 (*)    All other components within normal limits  I-STAT CHEM 8, ED - Abnormal; Notable for the following components:   Glucose, Bld 129 (*)    Calcium , Ion 1.13 (*)    Hemoglobin 11.6 (*)    HCT 34.0 (*)    All other components within normal limits  TROPONIN I (HIGH SENSITIVITY)  TROPONIN I (HIGH SENSITIVITY)    EKG: EKG Interpretation Date/Time:  Friday February 13 2024 12:38:15 EST Ventricular Rate:  55 PR Interval:  186 QRS Duration:  148 QT Interval:  446 QTC Calculation: 426 R Axis:   48  Text Interpretation: Sinus bradycardia Right bundle branch block Abnormal ECG When compared with ECG of 16-Aug-2023 16:54, PREVIOUS ECG IS PRESENT since last tracing no significant change Confirmed by Cleotilde Rogue ([id number]) on 02/13/2024 1:05:15 PM  Radiology: CT Angio Chest/Abd/Pel for Dissection W and/or Wo Contrast Result Date: 02/13/2024 EXAM: CTA CHEST, ABDOMEN AND PELVIS WITHOUT AND WITH CONTRAST 02/13/2024 02:14:00 PM TECHNIQUE: CTA of the chest was performed without and with the administration of intravenous contrast. CTA of the abdomen and pelvis was performed without and with the administration of intravenous contrast. 80 mL of iohexol  (OMNIPAQUE ) 350 MG/ML injection was administered. Multiplanar reformatted images are provided for  review. MIP images are provided for review. Automated exposure control, iterative reconstruction, and/or weight based adjustment of the mA/kV was utilized to reduce the radiation dose to as low as reasonably achievable. COMPARISON: 01/31/2018 CLINICAL HISTORY: Acute aortic syndrome (AAS) suspected; chest pain to back - numb legs. FINDINGS: VASCULATURE: AORTA: Aortic atherosclerosis. No acute finding. No abdominal aortic aneurysm. No dissection. PULMONARY ARTERIES: No pulmonary embolism with the limits of this exam. GREAT VESSELS OF AORTIC ARCH: No acute finding. No dissection. No arterial occlusion or significant stenosis. CELIAC TRUNK: There is either noted critical stenosis of the origin of celiac artery or occlusion. SUPERIOR MESENTERIC ARTERY: No acute finding. No occlusion or significant stenosis. INFERIOR MESENTERIC ARTERY: No acute finding. No occlusion or significant stenosis. RENAL ARTERIES: No acute finding. No occlusion or significant stenosis. ILIAC ARTERIES: No acute finding. No occlusion or significant stenosis. CHEST: MEDIASTINUM: Coronary artery calcifications are noted. No mediastinal lymphadenopathy. The heart and pericardium demonstrate no acute abnormality. LUNGS AND PLEURA: The lungs are without acute process. No focal consolidation or pulmonary edema. No evidence of pleural effusion or pneumothorax. THORACIC BONES AND SOFT TISSUES: No acute bone or soft tissue abnormality. ABDOMEN AND PELVIS: LIVER: Hepatic steatosis. GALLBLADDER AND BILE DUCTS: Gallbladder is unremarkable. No biliary ductal dilatation. SPLEEN: The spleen is unremarkable. PANCREAS: The pancreas is unremarkable. ADRENAL GLANDS: Bilateral adrenal glands demonstrate no acute abnormality. KIDNEYS, URETERS AND BLADDER: No stones in the kidneys or ureters. No hydronephrosis. No perinephric or periureteral stranding. Urinary bladder is unremarkable. GI AND BOWEL: Stomach and duodenal sweep demonstrate no acute abnormality. There is no  bowel obstruction. No abnormal bowel wall thickening or distension. REPRODUCTIVE: Reproductive organs are unremarkable. PERITONEUM AND RETROPERITONEUM: No ascites or free air. LYMPH NODES: No lymphadenopathy. ABDOMINAL BONES AND SOFT TISSUES: No acute abnormality of the bones. No acute soft tissue abnormality.  IMPRESSION: 1. 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 2. Hepatic steatosis. 3. No evidence of aortic aneurysm or dissection. Electronically signed by: Lynwood Seip MD 02/13/2024 02:47 PM EST RP Workstation: [id number]   DG Chest Port 1 View Result Date: 02/13/2024 EXAM: 1 VIEW(S) XRAY OF THE CHEST 02/13/2024 01:38:00 PM COMPARISON: 09/29/2023 CLINICAL HISTORY: chest pain FINDINGS: LUNGS AND PLEURA: Lungs are hypoinflated. No focal pulmonary opacity. No pleural effusion. No pneumothorax. HEART AND MEDIASTINUM: No acute abnormality of the cardiac and mediastinal silhouettes. BONES AND SOFT TISSUES: Thoracic degenerative changes. No acute osseous abnormality. IMPRESSION: 1. No acute cardiopulmonary process. 2. Low inspiratory effort with hypoinflated lungs. Electronically signed by: Waddell Calk MD 02/13/2024 02:35 PM EST RP Workstation: [id number]     Procedures   Medications Ordered in the ED  aspirin  chewable tablet 324 mg (324 mg Oral Given 02/13/24 1322)  iohexol  (OMNIPAQUE ) 350 MG/ML injection 80 mL (80 mLs Intravenous Contrast Given 02/13/24 1416)                                    Medical Decision Making Amount and/or Complexity of Data Reviewed Labs: ordered. Radiology: ordered.  Risk OTC drugs. Prescription drug management. Decision regarding hospitalization.    This patient presents to the ED for concern of chest pain, this involves an extensive number of treatment options, and is a complaint that carries with it a high risk of complications and morbidity.  The differential diagnosis includes acute coronary syndrome is certainly leading the  list however the patient also has had some neurologic symptoms with chest pain going to the back that could be consistent with a dissection.  She has a blood pressure of 182/63, less likely to be pulmonary embolism without tachycardia or hypoxia, thankfully neurologically intact at this time   Co morbidities / Chronic conditions that complicate the patient evaluation  Hypertension, coronary disease   Additional history obtained:  Additional history obtained from EMR External records from outside source obtained and reviewed including medical record including prior cath   Lab Tests:  I Ordered, and personally interpreted labs.  The pertinent results include: CBC blood panel unremarkable, troponin undetectable   Imaging Studies ordered:  I ordered imaging studies including chest x-ray and CT angiogram I independently visualized and interpreted imaging which showed possible celiac artery occlusion, no other thoracic abnormalities I agree with the radiologist interpretation   Cardiac Monitoring: / EKG:  The patient was maintained on a cardiac monitor.  I personally viewed and interpreted the cardiac monitored which showed an underlying rhythm of: Mild sinus bradycardia   Problem List / ED Course / Critical interventions / Medication management  No findings of acute coronary syndrome, could be unstable angina given her history, celiac artery abnormality, vascular will need to be involved  I have reviewed the patients home medicines and have made adjustments as needed   Consultations Obtained:  I requested consultation with the cardiology as well as hospitalist, hospitalist to admit, cardiology to consult, hospitalist will get vascular involved if they see fit,  and discussed lab and imaging findings as well as pertinent plan - they recommend: Admission   Social Determinants of Health:  Known coronary disease   Test / Admission - Considered:  Admit      Final  diagnoses:  Unstable angina [facility])    ED Discharge Orders     None  Cleotilde Rogue, MD 02/13/24 [phone]

## 2024-02-13 NOTE — Consult Note (Signed)
 Cardiology Consultation   Patient ID: [name] MRN: [id number]; DOB: 09-29-1950  Admit date: 02/13/2024 Date of Consult: 02/13/2024  PCP:  Watt Harlene BROCKS, MD   [facility] Providers Cardiologist:  Vinie BROCKS Maxcy, MD ye  Patient Profile: [name] is a 73 y.o. female with a hx of CAD with DES to pLAD 2010, RBBB, hypertension, diabetes, hyperlipidemia, OSA who is being seen 02/13/2024 for the evaluation of chest pain at the request of ED.  History of Present Illness: [name] patient with history of stent placed to LAD in 2010.  She has had cardiac catheterizations in 2021 and 2022 that demonstrated patent stent in the LAD.  She did not have any other obstructive disease noted.  Both times chest pain felt to be noncardiac.  She has had prior complaints that have been felt to be related to MSK or possible GI pathology.  EF has been normal without any significant valvular disease based on echocardiogram 10/2020.  Last seen in office 01/2023 and reported to be completely asymptomatic and without any acute complaints.  Currently patient being evaluated for chest pain, first troponin negative.  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  Chest x-ray was negative.  She was given aspirin  load.  Patient reports that she has done overall very well without any episodes of chest pain until yesterday where she was sitting on the couch and noticed chest pain in her mid sternum/abdomen that was radiating to her back.  Reported accompanied nausea, mild SOB.  Chest pain that she describes as a heaviness that is nonexertional and she is generally active without any exertional symptoms.  Had been playing with her grandchildren all day that day without any complaints.  She had taken a nitroglycerin  and felt that pain had improved some.  She went to bed and then woke up and noticed the pain again.  Reported also left leg weakness.  She has  tenderness to palpation with reproducible pain at the mid sternum.  Reports chest pain is exacerbated by anxiety.  While talking about her pain she started having the pain again while in the room.  She said she was very nervous about the pain and became tearful when discussing it.  Additionally, she is unsure whether or not she has had postprandial pain.  She reports that she has been eating smaller meals and only eats twice a day which has helped abdominal pain before.  She reports she has been also being worked up for anemia and underwent endoscopy and colonoscopy which were unremarkable.  Otherwise denied any shortness of breath, peripheral edema, orthopnea, palpitations, falls, syncope.   Past Medical History:  Diagnosis Date   Anemia    Arthritis    Bronchitis    hx of    CAD (coronary artery disease)    a. s/p DES to LAD in 2010 with patent stent by cath in 2014 b. low-risk NST in 11/2016   Cataracts, bilateral    Diabetes mellitus without complication (HCC)    Family history of adverse reaction to anesthesia    pts mother had difficulty awakening    GERD (gastroesophageal reflux disease)    Hyperlipidemia LDL goal < 70    Hypertension    Lumbar back pain    Numbness    left leg and foot    Plantar fascia rupture    Left Foot   Sleep apnea    uses CPAP  Past Surgical History:  Procedure Laterality Date   ABDOMINAL HYSTERECTOMY     BACK SURGERY     BREAST CYST ASPIRATION  1995   CAROTID STENT  2009   pt denies    CORONARY ANGIOPLASTY WITH STENT PLACEMENT  2010and 10-02-2012   Stent DES, Xience to prox. LAD   DOPPLER ECHOCARDIOGRAPHY  08/01/2009   EF=>55%,LV normal   GIVENS CAPSULE STUDY N/A 11/11/2023   Procedure: IMAGING PROCEDURE, GI TRACT, INTRALUMINAL, VIA CAPSULE;  Surgeon: Kristie Lamprey, MD;  Location: [facility];  Service: Gastroenterology;  Laterality: N/A;   LEFT HEART CATH AND CORONARY ANGIOGRAPHY N/A 12/10/2019   Procedure: LEFT HEART CATH AND CORONARY  ANGIOGRAPHY;  Surgeon: Claudene Victory ORN, MD;  Location: [facility] INVASIVE CV LAB;  Service: Cardiovascular;  Laterality: N/A;   LEFT HEART CATH AND CORONARY ANGIOGRAPHY N/A 10/30/2020   Procedure: LEFT HEART CATH AND CORONARY ANGIOGRAPHY;  Surgeon: Court Dorn PARAS, MD;  Location: [facility] INVASIVE CV LAB;  Service: Cardiovascular;  Laterality: N/A;   LEFT HEART CATHETERIZATION WITH CORONARY ANGIOGRAM N/A 10/02/2012   Procedure: LEFT HEART CATHETERIZATION WITH CORONARY ANGIOGRAM;  Surgeon: Dorn PARAS Court, MD;  Location: [facility] CATH LAB;  Service: Cardiovascular;  Laterality: N/A;   lower arterial duplex  06/20/10   abi's normal,rgt 0.98,lft 1.06;bilateral PVRs normal   LUMBAR LAMINECTOMY/DECOMPRESSION MICRODISCECTOMY Left 01/06/2013   Procedure: MICRO LUMBAR DECOMPRESSION L4-5 AND L5-S1;  Surgeon: Reyes JAYSON Billing, MD;  Location: [facility] ORS;  Service: Orthopedics;  Laterality: Left;   LUMBAR LAMINECTOMY/DECOMPRESSION MICRODISCECTOMY Left 10/12/2014   Procedure: REVISION MICRO LUMBAR/DECOMPRESSION L4-5 LEFT ;  Surgeon: Reyes Billing, MD;  Location: [facility] ORS;  Service: Orthopedics;  Laterality: Left;   NM MYOCAR PERF WALL MOTION  09/22/2008   lexiscan -EF 83%; glogal LV systolic fx is norm. ,evidence of mild ischemia basal anterior,midanterior and apical lateral region(s).    ORIF PATELLA Right 08/29/2016   Procedure: OPEN REDUCTION INTERNAL (ORIF) FIXATION RIGHT PATELLA;  Surgeon: Fidel Rogue, MD;  Location: [facility] ORS;  Service: Orthopedics;  Laterality: Right;  Adductor Block   TUBAL LIGATION     TYMPANOPLASTY Bilateral    UVULOPALATOPHARYNGOPLASTY     pt denies      Scheduled Meds:  [START ON 02/14/2024] aspirin  EC  81 mg Oral Daily   [START ON 02/14/2024] clopidogrel   75 mg Oral Daily   denosumab   60 mg Subcutaneous Q6 months   [START ON 05/08/2024] denosumab   60 mg Subcutaneous Q6 months   donepezil   5 mg Oral QHS   [START ON 02/14/2024] ezetimibe   10 mg Oral Daily   [START ON 02/14/2024] famotidine   20 mg Oral  Daily   [START ON 02/14/2024] FLUoxetine   20 mg Oral Daily   heparin   5,000 Units Subcutaneous Q8H   [START ON 02/14/2024] irbesartan   150 mg Oral Daily   Continuous Infusions:  PRN Meds: acetaminophen , albuterol , clonazePAM , ondansetron  (ZOFRAN ) IV  Allergies:    Allergies  Allergen Reactions   Crestor  [Rosuvastatin ] Other (See Comments)    myalgia   Niacin And Related Other (See Comments)    Whelps and skin flushed, mouth tingling    Social History:   Social History   Socioeconomic History   Marital status: Married    Spouse name: [name]   Number of children: 2   Years of education: MSN   Highest education level: Master's degree (e.g., MA, MS, MEng, MEd, MSW, MBA)  Occupational History   Occupation: DIRECTOR    Employer: [employer]  Tobacco Use   Smoking  status: Never   Smokeless tobacco: Never  Vaping Use   Vaping status: Never Used  Substance and Sexual Activity   Alcohol use: Yes    Comment: occasional, 1 a month wine   Drug use: No   Sexual activity: Yes  Other Topics Concern   Not on file  Social History Narrative   Married to [place]. Education: [employer]. Exercise: Yes   Patient lives at home with her spouse. retired   Caffeine use: 1 drink of tea a day   Social Drivers of Corporate Investment Banker Strain: Medium Risk (06/25/2022)   Overall Financial Resource Strain (CARDIA)    Difficulty of Paying Living Expenses: Somewhat hard  Food Insecurity: No Food Insecurity (08/17/2023)   Hunger Vital Sign    Worried About Running Out of Food in the Last Year: Never true    Ran Out of Food in the Last Year: Never true  Transportation Needs: No Transportation Needs (08/17/2023)   PRAPARE - Administrator, Civil Service (Medical): No    Lack of Transportation (Non-Medical): No  Physical Activity: Unknown (06/25/2022)   Exercise Vital Sign    Days of Exercise per Week: 0 days    Minutes of Exercise per Session: Not on file  Stress: No Stress  Concern Present (06/25/2022)   [employer] of Occupational Health - Occupational Stress Questionnaire    Feeling of Stress : Only a little  Social Connections: Socially Integrated (08/16/2023)   Social Connection and Isolation Panel    Frequency of Communication with Friends and Family: More than three times a week    Frequency of Social Gatherings with Friends and Family: More than three times a week    Attends Religious Services: More than 4 times per year    Active Member of [employer] or Organizations: Yes    Attends Engineer, Structural: More than 4 times per year    Marital Status: Married  Catering Manager Violence: Not At Risk (08/17/2023)   Humiliation, Afraid, Rape, and Kick questionnaire    Fear of Current or Ex-Partner: No    Emotionally Abused: No    Physically Abused: No    Sexually Abused: No    Family History:   Family History  Problem Relation Age of Onset   Coronary artery disease Mother    Rheum arthritis Mother    Dementia Mother    Heart attack Father    Hypertension Father    Hyperlipidemia Father    Other Father        MVA   Hypertension Brother    Cancer Brother    Heart disease Brother    Cancer Paternal Grandmother        stomach   Diabetes Paternal Grandfather    Breast cancer Neg Hx      ROS:  Please see the history of present illness.  All other ROS reviewed and negative.     Physical Exam/Data: Vitals:   02/13/24 1330 02/13/24 1345 02/13/24 1400 02/13/24 1514  BP: (!) 147/51 (!) 141/46  107/76  Pulse: (!) 55 (!) 51 (!) 57 79  Resp: 18 15 19 18   Temp:    98.6 F (37 C)  TempSrc:    Oral  SpO2: 100% 100% 100% 92%   No intake or output data in the 24 hours ending 02/13/24 1536    02/04/2024    8:49 AM 11/12/2023    1:18 PM 11/11/2023    8:15 AM  Last 3 Weights  Weight (lbs) 199 lb 12.8 oz 195 lb 187 lb  Weight (kg) 90.629 kg 88.451 kg 84.823 kg     There is no height or weight on file to calculate BMI.  General:  Well  nourished, well developed, in no acute distress HEENT: normal Neck: no JVD Vascular: No carotid bruits; Distal pulses 2+ bilaterally Cardiac:  normal S1, S2; RRR; no murmur  Lungs:  clear to auscultation bilaterally, no wheezing, rhonchi or rales  Abd: soft, nontender, no hepatomegaly  Ext: no edema Musculoskeletal:  No deformities, BUE and BLE strength normal and equal Skin: warm and dry  Neuro:  CNs 2-12 intact, no focal abnormalities noted Psych:  Normal affect   EKG:  The EKG was personally reviewed and demonstrates: Sinus bradycardia heart rate 55.  Right bundle branch block.  No acute ST-T wave changes.  Generally unchanged from prior. Telemetry:  Telemetry was personally reviewed and demonstrates: Sinus bradycardia heart rate between 50-60.  Relevant CV Studies: Cardiac catheterization 11-10-20  IMPRESSION: Ms. Guillette's  proximal LAD stent is widely patent as it was a year ago.  She has no other obstructive disease.  LV function is normal by 2D echo.  I believe her chest pain is noncardiac.  The sheath was removed and a TR band was placed on the right wrist to achieve patent hemostasis.  The patient left the lab in stable condition.  Dr. Verlin was made aware of these results.  Anticipate discharge later today.   Laboratory Data: High Sensitivity Troponin:   Recent Labs  Lab 02/13/24 1306  TROPONINIHS 9     Chemistry Recent Labs  Lab 02/13/24 1306 02/13/24 1337  NA 138 141  K 4.1 4.3  CL 105 108  CO2 22  --   GLUCOSE 139* 129*  BUN 14 16  CREATININE 0.93 0.90  CALCIUM  9.3  --   GFRNONAA >60  --   ANIONGAP 11  --     Recent Labs  Lab 02/13/24 1306  PROT 6.9  ALBUMIN 3.9  AST 28  ALT 22  ALKPHOS 49  BILITOT 0.5   Lipids No results for input(s): CHOL, TRIG, HDL, LABVLDL, LDLCALC, CHOLHDL in the last 168 hours.  Hematology Recent Labs  Lab 02/13/24 1306 02/13/24 1337  WBC 10.5  --   RBC 4.71  --   HGB 12.2 11.6*  HCT 38.2 34.0*  MCV 81.1   --   MCH 25.9*  --   MCHC 31.9  --   RDW 14.6  --   PLT 301  --    Thyroid  No results for input(s): TSH, FREET4 in the last 168 hours.  BNPNo results for input(s): BNP, PROBNP in the last 168 hours.  DDimer No results for input(s): DDIMER in the last 168 hours.  Radiology/Studies:  CT Angio Chest/Abd/Pel for Dissection W and/or Wo Contrast Result Date: 02/13/2024 EXAM: CTA CHEST, ABDOMEN AND PELVIS WITHOUT AND WITH CONTRAST 02/13/2024 02:14:00 PM TECHNIQUE: CTA of the chest was performed without and with the administration of intravenous contrast. CTA of the abdomen and pelvis was performed without and with the administration of intravenous contrast. 80 mL of iohexol  (OMNIPAQUE ) 350 MG/ML injection was administered. Multiplanar reformatted images are provided for review. MIP images are provided for review. Automated exposure control, iterative reconstruction, and/or weight based adjustment of the mA/kV was utilized to reduce the radiation dose to as low as reasonably achievable. COMPARISON: 01/31/2018 CLINICAL HISTORY: Acute aortic syndrome (AAS) suspected; chest pain to back - numb legs. FINDINGS:  VASCULATURE: AORTA: Aortic atherosclerosis. No acute finding. No abdominal aortic aneurysm. No dissection. PULMONARY ARTERIES: No pulmonary embolism with the limits of this exam. GREAT VESSELS OF AORTIC ARCH: No acute finding. No dissection. No arterial occlusion or significant stenosis. CELIAC TRUNK: There is either noted critical stenosis of the origin of celiac artery or occlusion. SUPERIOR MESENTERIC ARTERY: No acute finding. No occlusion or significant stenosis. INFERIOR MESENTERIC ARTERY: No acute finding. No occlusion or significant stenosis. RENAL ARTERIES: No acute finding. No occlusion or significant stenosis. ILIAC ARTERIES: No acute finding. No occlusion or significant stenosis. CHEST: MEDIASTINUM: Coronary artery calcifications are noted. No mediastinal lymphadenopathy. The heart and  pericardium demonstrate no acute abnormality. LUNGS AND PLEURA: The lungs are without acute process. No focal consolidation or pulmonary edema. No evidence of pleural effusion or pneumothorax. THORACIC BONES AND SOFT TISSUES: No acute bone or soft tissue abnormality. ABDOMEN AND PELVIS: LIVER: Hepatic steatosis. GALLBLADDER AND BILE DUCTS: Gallbladder is unremarkable. No biliary ductal dilatation. SPLEEN: The spleen is unremarkable. PANCREAS: The pancreas is unremarkable. ADRENAL GLANDS: Bilateral adrenal glands demonstrate no acute abnormality. KIDNEYS, URETERS AND BLADDER: No stones in the kidneys or ureters. No hydronephrosis. No perinephric or periureteral stranding. Urinary bladder is unremarkable. GI AND BOWEL: Stomach and duodenal sweep demonstrate no acute abnormality. There is no bowel obstruction. No abnormal bowel wall thickening or distension. REPRODUCTIVE: Reproductive organs are unremarkable. PERITONEUM AND RETROPERITONEUM: No ascites or free air. LYMPH NODES: No lymphadenopathy. ABDOMINAL BONES AND SOFT TISSUES: No acute abnormality of the bones. No acute soft tissue abnormality. IMPRESSION: 1. 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 2. Hepatic steatosis. 3. No evidence of aortic aneurysm or dissection. Electronically signed by: Lynwood Seip MD 02/13/2024 02:47 PM EST RP Workstation: [id number]   DG Chest Port 1 View Result Date: 02/13/2024 EXAM: 1 VIEW(S) XRAY OF THE CHEST 02/13/2024 01:38:00 PM COMPARISON: 09/29/2023 CLINICAL HISTORY: chest pain FINDINGS: LUNGS AND PLEURA: Lungs are hypoinflated. No focal pulmonary opacity. No pleural effusion. No pneumothorax. HEART AND MEDIASTINUM: No acute abnormality of the cardiac and mediastinal silhouettes. BONES AND SOFT TISSUES: Thoracic degenerative changes. No acute osseous abnormality. IMPRESSION: 1. No acute cardiopulmonary process. 2. Low inspiratory effort with hypoinflated lungs. Electronically signed by: Waddell Calk MD 02/13/2024 02:35 PM EST RP Workstation: [id number]     Assessment and Plan:  Chest pain CAD with DES to LAD 2010 -2021/2022 widely patent LAD stent, no obstructive CAD elsewhere Mixed presentation but overall seems to be noncardiac.  She reports predominantly nonexertional, anxiety induced, reproducible, chest pain with palpation that she says radiates to her back.  EKG with no acute ST-T wave changes, first troponin negative.  Question if her critical stenosis of her celiac artery could also be contributing here. Repeat EKG, pending second troponin. Would workup celiac stenosis and get vascular's input. Chronically has been on DAPT therapy with aspirin  and Plavix .  Suspect she can go on monotherapy now but would defer to MD.  Baseline heart rates bradycardic so no need for beta-blocker. May be beneficial to get NM PET stress outpatient to help assess for microvascular dysfunction.  However, she is already on amlodipine  10 mg and Imdur  30 mg. LDL 96 9 days ago, goal less than 55.  She has had some issues with myalgias in the past with statins, continue simvastatin  40 mg, Zetia .  Consider PCSK9 inhibitor outpatient. 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  OSA  Noncompliant on CPAP  Diabetes A1c 8.6%.  Needs better control.  We are comfortable with her discharging and outpatient follow up will be arranged.    Risk Assessment/Risk Scores:   For questions or updates, please contact [facility] Please consult [email or web address] for contact info under      Signed, Thom LITTIE Sluder, PA-C  02/13/2024 3:36 PM

## 2024-02-16 ENCOUNTER — Ambulatory Visit: Payer: Self-pay

## 2024-02-16 ENCOUNTER — Telehealth: Payer: Self-pay | Admitting: Family Medicine

## 2024-02-16 NOTE — Telephone Encounter (Signed)
 Copied from CRM #[id number]. Topic: Referral - Request for Referral >> Feb 16, 2024  9:13 AM Olam RAMAN wrote: Did the patient discuss referral with their provider in the last year? No (If No - schedule appointment) (If Yes - send message)  Appointment offered? No  Type of order/referral and detailed reason for visit: Heart  Preference of office, provider, location: Dr Gretta [address]  If referral order, have you been seen by this specialty before? No (If Yes, this issue or another issue? When? Where?  Can we respond through MyChart? No

## 2024-02-16 NOTE — Telephone Encounter (Signed)
 FYI Only or Action Required?: FYI only for provider: appointment scheduled on 12/1.  Patient was last seen in primary care on 02/04/2024 by Copland, Harlene BROCKS, MD.  Called Nurse Triage reporting Chest Pain.  Symptoms began several days ago.  Interventions attempted: Other: Seen in ED cleared of acute symptoms.  Symptoms are: stable.  Triage Disposition: See Physician Within 24 Hours  Patient/caregiver understands and will follow disposition?: Yes            Copied from CRM #[id number]. Topic: Clinical - Red Word Triage >> Feb 16, 2024  9:16 AM Olam RAMAN wrote: Red Word that prompted transfer to Nurse Triage: pt went to the ER and they found a vessel from an MRI/chest WENT TO ER CHEST MRI FRIDAY [place] vessel Reason for Disposition  [1] Chest pain lasts > 5 minutes AND [2] occurred > 3 days ago (72 hours) AND [3] NO chest pain or cardiac symptoms now  Answer Assessment - Initial Assessment Questions 1. LOCATION: Where does it hurt?       Center of chest  2. RADIATION: Does the pain go anywhere else? (e.g., into neck, jaw, arms, back)     Back   3. ONSET: When did the chest pain begin? (Minutes, hours or days)      Ongoing for a few days  4. PATTERN: Does the pain come and go, or has it been constant since it started?  Does it get worse with exertion?      Intermittent   5. DURATION: How long does it last (e.g., seconds, minutes, hours)      She stated duration varies  6. SEVERITY: How bad is the pain?  (e.g., Scale 1-10; mild, moderate, or severe)     5/10  7. CARDIAC RISK FACTORS: Do you have any history of heart problems or risk factors for heart disease? (e.g., angina, prior heart attack; diabetes, high blood pressure, high cholesterol, smoker, or strong family history of heart disease)     Dx. With heart valve stenosis while in the ED   8. PULMONARY RISK FACTORS: Do you have any history of lung disease?  (e.g., blood clots in lung, asthma,  emphysema, birth control pills)     No   9. CAUSE: What do you think is causing the chest pain?     celiac artery occlusion  10. OTHER SYMPTOMS: Do you have any other symptoms? (e.g., dizziness, nausea, vomiting, sweating, fever, difficulty breathing, cough)      No   Patient seen in ED last Friday 11/21 and was diagnosed with celiac artery occlusion, no other findings noted and she was cleared of acute symptoms;  referred to cardiology in which she has an upcomming 12/18 appt. Scheduled. She was advised to follow-up with PCP. Sooner appt. Offered with different provider, however patient declined and only wishes to see Dr. Watt. She was scheduled for a hospital follow-up on 12/1. She agrees that if symptoms worsen to be seen back in the ED for evaluation.  Protocols used: Chest Pain-A-AH

## 2024-02-17 ENCOUNTER — Telehealth: Payer: Self-pay | Admitting: *Deleted

## 2024-02-17 ENCOUNTER — Ambulatory Visit (INDEPENDENT_AMBULATORY_CARE_PROVIDER_SITE_OTHER): Admitting: *Deleted

## 2024-02-17 ENCOUNTER — Other Ambulatory Visit: Payer: Self-pay | Admitting: Surgery

## 2024-02-17 VITALS — Ht 62.0 in | Wt 199.0 lb

## 2024-02-17 DIAGNOSIS — I774 Celiac artery compression syndrome: Secondary | ICD-10-CM

## 2024-02-17 DIAGNOSIS — Z0000 Encounter for general adult medical examination without abnormal findings: Secondary | ICD-10-CM | POA: Diagnosis not present

## 2024-02-17 DIAGNOSIS — Z1231 Encounter for screening mammogram for malignant neoplasm of breast: Secondary | ICD-10-CM

## 2024-02-17 NOTE — Telephone Encounter (Signed)
 Pt had AWV today. Wanted to let you know that she has been referred to a Vein and Vascular specialist for her recent diagnosis of celiac artery stenosis. Pt has f/u with PCP on 02/23/24

## 2024-02-17 NOTE — Patient Instructions (Signed)
 Ms. [name],  Thank you for taking the time for your Medicare Wellness Visit. I appreciate your continued commitment to your health goals. Please review the care plan we discussed, and feel free to reach out if I can assist you further.  Please note that Annual Wellness Visits do not include a physical exam. Some assessments may be limited, especially if the visit was conducted virtually. If needed, we may recommend an in-person follow-up with your provider.  Ongoing Care Seeing your primary care provider every 3 to 6 months helps us  monitor your health and provide consistent, personalized care.   Dr Watt:  02/23/24 10:20am Medicare AWV: 02/23/25 9am, telephone  Referrals If a referral was made during today's visit and you haven't received any updates within two weeks, please contact the referred provider directly to check on the status.  Mammogram due 05/19/24 [facility]):  [phone] [facility]: diabetic eye exam due 04/16/24 or after  Recommended Screenings:  Health Maintenance  Topic Date Due   Eye exam for diabetics  04/16/2024   Complete foot exam   07/01/2024   COVID-19 Vaccine (8 - 2025-26 season) 07/21/2024   Hemoglobin A1C  08/03/2024   Yearly kidney health urinalysis for diabetes  09/30/2024   Yearly kidney function blood test for diabetes  02/12/2025   Medicare Annual Wellness Visit  02/16/2025   Breast Cancer Screening  05/18/2025   DTaP/Tdap/Td vaccine (3 - Td or Tdap) 11/15/2028   Colon Cancer Screening  11/11/2030   Pneumococcal Vaccine for age over 44  Completed   Flu Shot  Completed   Osteoporosis screening with Bone Density Scan  Completed   Hepatitis C Screening  Completed   Zoster (Shingles) Vaccine  Completed   Meningitis B Vaccine  Aged Out       02/17/2024    8:30 AM  Advanced Directives  Does Patient Have a Medical Advance Directive? No  Would patient like information on creating a medical advance directive? Yes  (MAU/Ambulatory/Procedural Areas - Information given)  Please let me know if you do not receive your Advanced Directive Packet within 1 week. Once completed and notarized, you may return a copy of your Advanced Directive(s) by either of the following:  Bring a copy of your health care power of attorney and living will to the office to be added to your chart at your convenience. You can mail a copy to [facility] [address] [place], [place] [address] or email to [email or web address]    Vision: Annual vision screenings are recommended for early detection of glaucoma, cataracts, and diabetic retinopathy. These exams can also reveal signs of chronic conditions such as diabetes and high blood pressure.  Dental: Annual dental screenings help detect early signs of oral cancer, gum disease, and other conditions linked to overall health, including heart disease and diabetes.  Please see the attached documents for additional preventive care recommendations.

## 2024-02-17 NOTE — Telephone Encounter (Signed)
 Copied from CRM [id number]. Topic: General - Other >> Feb 17, 2024  2:51 PM Macario HERO wrote: Reason for CRM: Patient called and said that her chest is hurting really bad. Declined nurse triage. Stated its chronic and will take surgical intervention, stated she has her appointment with Vein and Vascular specialist on January 5th.

## 2024-02-17 NOTE — Progress Notes (Signed)
 Please attest this visit in the absence of patient primary care provider.   Chief Complaint  Patient presents with   Medicare Wellness     Subjective:   [name] is a 73 y.o. female who presents for a Medicare Annual Wellness Visit.  Allergies (verified) Crestor  [rosuvastatin ] and Niacin and related   History: Past Medical History:  Diagnosis Date   Anemia    Arthritis    Bronchitis    hx of    CAD (coronary artery disease)    a. s/p DES to LAD in 2010 with patent stent by cath in 2014 b. low-risk NST in 11/2016   Cataracts, bilateral    Celiac artery stenosis 2025   Diabetes mellitus without complication (HCC)    Family history of adverse reaction to anesthesia    pts mother had difficulty awakening    GERD (gastroesophageal reflux disease)    Hyperlipidemia LDL goal < 70    Hypertension    Lumbar back pain    Numbness    left leg and foot    Plantar fascia rupture    Left Foot   Sleep apnea    uses CPAP    Past Surgical History:  Procedure Laterality Date   ABDOMINAL HYSTERECTOMY     BACK SURGERY     BREAST CYST ASPIRATION  1995   CAROTID STENT  2009   pt denies    CORONARY ANGIOPLASTY WITH STENT PLACEMENT  2010and 10-02-2012   Stent DES, Xience to prox. LAD   DOPPLER ECHOCARDIOGRAPHY  08/01/2009   EF=>55%,LV normal   GIVENS CAPSULE STUDY N/A 11/11/2023   Procedure: IMAGING PROCEDURE, GI TRACT, INTRALUMINAL, VIA CAPSULE;  Surgeon: Kristie Lamprey, MD;  Location: [facility] ENDOSCOPY;  Service: Gastroenterology;  Laterality: N/A;   LEFT HEART CATH AND CORONARY ANGIOGRAPHY N/A 12/10/2019   Procedure: LEFT HEART CATH AND CORONARY ANGIOGRAPHY;  Surgeon: Claudene Victory ORN, MD;  Location: [facility] INVASIVE CV LAB;  Service: Cardiovascular;  Laterality: N/A;   LEFT HEART CATH AND CORONARY ANGIOGRAPHY N/A 10/30/2020   Procedure: LEFT HEART CATH AND CORONARY ANGIOGRAPHY;  Surgeon: Court Dorn JINNY, MD;  Location: [facility] INVASIVE CV LAB;  Service: Cardiovascular;  Laterality: N/A;   LEFT  HEART CATHETERIZATION WITH CORONARY ANGIOGRAM N/A 10/02/2012   Procedure: LEFT HEART CATHETERIZATION WITH CORONARY ANGIOGRAM;  Surgeon: Dorn JINNY Court, MD;  Location: [facility] CATH LAB;  Service: Cardiovascular;  Laterality: N/A;   lower arterial duplex  06/20/10   abi's normal,rgt 0.98,lft 1.06;bilateral PVRs normal   LUMBAR LAMINECTOMY/DECOMPRESSION MICRODISCECTOMY Left 01/06/2013   Procedure: MICRO LUMBAR DECOMPRESSION L4-5 AND L5-S1;  Surgeon: Reyes JAYSON Billing, MD;  Location: [facility] ORS;  Service: Orthopedics;  Laterality: Left;   LUMBAR LAMINECTOMY/DECOMPRESSION MICRODISCECTOMY Left 10/12/2014   Procedure: REVISION MICRO LUMBAR/DECOMPRESSION L4-5 LEFT ;  Surgeon: Reyes Billing, MD;  Location: [facility] ORS;  Service: Orthopedics;  Laterality: Left;   NM MYOCAR PERF WALL MOTION  09/22/2008   lexiscan -EF 83%; glogal LV systolic fx is norm. ,evidence of mild ischemia basal anterior,midanterior and apical lateral region(s).    ORIF PATELLA Right 08/29/2016   Procedure: OPEN REDUCTION INTERNAL (ORIF) FIXATION RIGHT PATELLA;  Surgeon: Fidel Rogue, MD;  Location: [facility] ORS;  Service: Orthopedics;  Laterality: Right;  Adductor Block   TUBAL LIGATION     TYMPANOPLASTY Bilateral    UVULOPALATOPHARYNGOPLASTY     pt denies    Family History  Problem Relation Age of Onset   Coronary artery disease Mother    Rheum arthritis Mother  Dementia Mother    Heart attack Father    Hypertension Father    Hyperlipidemia Father    Other Father        MVA   Hypertension Brother    Cancer Brother    Heart disease Brother    Cancer Paternal Grandmother        stomach   Diabetes Paternal Grandfather    Breast cancer Neg Hx    Social History   Occupational History   Occupation: DIRECTOR    Employer: [employer]  Tobacco Use   Smoking status: Never   Smokeless tobacco: Never  Vaping Use   Vaping status: Never Used  Substance and Sexual Activity   Alcohol use: Not Currently   Drug use: No   Sexual activity:  Yes   Tobacco Counseling Counseling given: Not Answered  SDOH Screenings   Food Insecurity: No Food Insecurity (02/17/2024)  Housing: Low Risk  (02/17/2024)  Transportation Needs: No Transportation Needs (02/17/2024)  Utilities: Not At Risk (02/17/2024)  Alcohol Screen: Low Risk  (06/25/2022)  Depression (PHQ2-9): Low Risk  (02/17/2024)  Financial Resource Strain: Medium Risk (06/25/2022)  Physical Activity: Sufficiently Active (02/17/2024)  Social Connections: Socially Integrated (02/17/2024)  Stress: Stress Concern Present (02/17/2024)  Tobacco Use: Low Risk  (02/17/2024)   See flowsheets for full screening details  Depression Screen PHQ 2 & 9 Depression Scale- Over the past 2 weeks, how often have you been bothered by any of the following problems? Little interest or pleasure in doing things: 1 (hasn't wanted to go shopping and attributes it to current health) Feeling down, depressed, or hopeless (PHQ Adolescent also includes...irritable): 0 PHQ-2 Total Score: 1 Trouble falling or staying asleep, or sleeping too much: 1 (has difficulty staying asleep, hx of insomnia) Feeling tired or having little energy: 1 (has chronic anemia. sometimes get tired when cooking a big meal) Poor appetite or overeating (PHQ Adolescent also includes...weight loss): 1 (has decreased appetite) Feeling bad about yourself - or that you are a failure or have let yourself or your family down: 0 Trouble concentrating on things, such as reading the newspaper or watching television (PHQ Adolescent also includes...like school work): 0 Moving or speaking so slowly that other people could have noticed. Or the opposite - being so fidgety or restless that you have been moving around a lot more than usual: 0 Thoughts that you would be better off dead, or of hurting yourself in some way: 0 PHQ-9 Total Score: 4 If you checked off any problems, how difficult have these problems made it for you to do your work, take care  of things at home, or get along with other people?: Somewhat difficult  Depression Treatment Depression Interventions/Treatment : EYV7-0 Score <4 Follow-up Not Indicated     Goals Addressed   None    Visit info / Clinical Intake: Medicare Wellness Visit Type:: Subsequent Annual Wellness Visit Persons participating in visit:: patient Medicare Wellness Visit Mode:: Telephone If telephone:: video declined Because this visit was a virtual/telehealth visit:: pt reported vitals If Telephone or Video please confirm:: I connected with the patient using audio enabled telemedicine application and verified that I am speaking with the correct person using two identifiers; I discussed the limitations of evaluation and management by telemedicine; The patient expressed understanding and agreed to proceed Patient Location:: home Provider Location:: office Information given by:: patient Interpreter Needed?: No Pre-visit prep was completed: yes AWV questionnaire completed by patient prior to visit?: no Living arrangements:: lives with spouse/significant other  Patient's Overall Health Status Rating: good Typical amount of pain: some (occasionally (mild to severe)) Does pain affect daily life?: (!) yes (when pain is severe) Are you currently prescribed opioids?: no  Dietary Habits and Nutritional Risks How many meals a day?: 2 (eating lighter meals) Eats fruit and vegetables daily?: yes Most meals are obtained by: preparing own meals; eating out In the last 2 weeks, have you had any of the following?: none Diabetic:: (!) yes Any non-healing wounds?: no How often do you check your BS?: as needed Would you like to be referred to a Nutritionist or for Diabetic Management? : no  Functional Status Activities of Daily Living (to include ambulation/medication): Independent Ambulation: Independent (uses cane occasionally) Medication Administration: Independent Home Management: Independent Manage your  own finances?: yes Primary transportation is: driving Concerns about vision?: no *vision screening is required for WT[facility]* (up to date with [facility], due in 03/2024) Concerns about hearing?: no  Fall Screening Falls in the past year?: 0 Number of falls in past year: 0 Was there an injury with Fall?: 0 Fall Risk Category Calculator: 0 Patient Fall Risk Level: Low Fall Risk  Fall Risk Patient at Risk for Falls Due to: Orthopedic patient Fall risk Follow up: Falls evaluation completed  Home and Transportation Safety: All rugs have non-skid backing?: yes (bathroom rug) All stairs or steps have railings?: yes Grab bars in the bathtub or shower?: (!) no Have non-skid surface in bathtub or shower?: yes Good home lighting?: yes Regular seat belt use?: yes Hospital stays in the last year:: no  Cognitive Assessment Difficulty concentrating, remembering, or making decisions? : yes (has felt more forgetful recently and been under stress with health problems and family situations) Will 6CIT or Mini Cog be Completed: yes What year is it?: 0 points What month is it?: 0 points Give patient an address phrase to remember (5 components): [address], [place]  About what time is it?: 0 points Count backwards from 20 to 1: 0 points Say the months of the year in reverse: 0 points Repeat the address phrase from earlier: 2 points 6 CIT Score: 2 points  Advance Directives (For Healthcare) Does Patient Have a Medical Advance Directive?: No Would patient like information on creating a medical advance directive?: Yes (MAU/Ambulatory/Procedural Areas - Information given)  Reviewed/Updated  Reviewed/Updated: Reviewed All (Medical, Surgical, Family, Medications, Allergies, Care Teams, Patient Goals)        Objective:    Today's Vitals   02/17/24 0824  Weight: 199 lb (90.3 kg)  Height: 5' 2 (1.575 m)   Body mass index is 36.4 kg/m.  Current Medications (verified) Outpatient  Encounter Medications as of 02/17/2024  Medication Sig   albuterol  (VENTOLIN  HFA) 108 (90 Base) MCG/ACT inhaler Inhale 2 puffs into the lungs every 6 (six) hours as needed for wheezing or shortness of breath.   amLODipine  (NORVASC ) 10 MG tablet Take 1 tablet (10 mg total) by mouth at bedtime.   aspirin  EC 81 MG EC tablet Take 1 tablet (81 mg total) by mouth daily.   cholecalciferol  (VITAMIN D ) 1000 UNITS tablet Take 1,000 Units by mouth daily.   clonazePAM  (KLONOPIN ) 0.5 MG tablet Take 0.5-1 tablets (0.25-0.5 mg total) by mouth 2 (two) times daily as needed for anxiety.   clopidogrel  (PLAVIX ) 75 MG tablet Take 1 tablet (75 mg total) by mouth daily.   donepezil  (ARICEPT ) 5 MG tablet Take 1 tablet (5 mg total) by mouth at bedtime.   ezetimibe  (ZETIA ) 10 MG  tablet Take 1 tablet (10 mg total) by mouth daily.   famotidine  (PEPCID ) 20 MG tablet Take 1 tablet (20 mg total) by mouth daily.   fenofibrate  (TRICOR ) 145 MG tablet Take 1 tablet (145 mg) by mouth daily.   ferrous sulfate  325 (65 FE) MG tablet Take 325 mg by mouth daily with breakfast.   FLUoxetine  (PROZAC ) 20 MG capsule Take 1 capsule (20 mg total) by mouth daily.   fluticasone  (FLONASE ) 50 MCG/ACT nasal spray Place 1 spray into both nostrils daily as needed for allergies or rhinitis.   ibuprofen (ADVIL) 200 MG tablet Take 400 mg by mouth daily as needed for mild pain (knee pain).   icosapent  Ethyl (VASCEPA ) 1 g capsule Take 1 capsule (1 g total) by mouth 2 (two) times daily. (Patient taking differently: Take 1 g by mouth daily.)   isosorbide  mononitrate (IMDUR ) 30 MG 24 hr tablet Take 1 tablet (30 mg total) by mouth daily.   metFORMIN  (GLUCOPHAGE -XR) 500 MG 24 hr tablet Take 500 mg by mouth daily with breakfast.   Multiple Vitamin (MULTIVITAMIN WITH MINERALS) TABS tablet Take 1 tablet by mouth daily.   nitroGLYCERIN  (NITROSTAT ) 0.4 MG SL tablet Place 1 tablet under the tongue every 5 minutes as needed for chest pain.   ondansetron  (ZOFRAN ) 4  MG tablet Take 1 tablet (4 mg total) by mouth every 8 (eight) hours as needed for nausea or vomiting.   RABEprazole  (ACIPHEX ) 20 MG tablet Take 1 tablet (20 mg total) by mouth daily 30 minutes prior to food (Patient taking differently: Take 20 mg by mouth See admin instructions. Take 1 tablet by mouth 4 times weekly.)   simvastatin  (ZOCOR ) 40 MG tablet Take 1 tablet (40 mg total) by mouth daily.   telmisartan  (MICARDIS ) 40 MG tablet Take 1 tablet (40 mg total) by mouth daily.   UNABLE TO FIND Take 1 each by mouth daily. Med Name: Viactive Vitamin Caramel   [DISCONTINUED] benzonatate  (TESSALON ) 100 MG capsule Take 1 capsule (100 mg total) by mouth 2 (two) times daily as needed for cough.   [DISCONTINUED] rosuvastatin  (CRESTOR ) 20 MG tablet Take 1 tablet (20 mg total) by mouth daily.   Facility-Administered Encounter Medications as of 02/17/2024  Medication   denosumab  (PROLIA ) injection 60 mg   [START ON 05/08/2024] denosumab  (PROLIA ) injection 60 mg   Hearing/Vision screen No results found. Immunizations and Health Maintenance Health Maintenance  Topic Date Due   OPHTHALMOLOGY EXAM  04/16/2024   FOOT EXAM  07/01/2024   COVID-19 Vaccine (8 - 2025-26 season) 07/21/2024   HEMOGLOBIN A1C  08/03/2024   Diabetic kidney evaluation - Urine ACR  09/30/2024   Diabetic kidney evaluation - eGFR measurement  02/12/2025   Medicare Annual Wellness (AWV)  02/16/2025   Mammogram  05/18/2025   DTaP/Tdap/Td (3 - Td or Tdap) 11/15/2028   Colonoscopy  11/11/2030   Pneumococcal Vaccine: 50+ Years  Completed   Influenza Vaccine  Completed   Bone Density Scan  Completed   Hepatitis C Screening  Completed   Zoster Vaccines- Shingrix  Completed   Meningococcal B Vaccine  Aged Out        Assessment/Plan:  This is a routine wellness examination for [place].  Patient Care Team: Copland, Harlene BROCKS, MD as PCP - General (Family Medicine) Mona Vinie BROCKS, MD as PCP - Cardiology (Cardiology) Carla Milling,  RPH-CPP (Pharmacist)  I have personally reviewed and noted the following in the patient's chart:   Medical and social history Use of alcohol,  tobacco or illicit drugs  Current medications and supplements including opioid prescriptions. Functional ability and status Nutritional status Physical activity Advanced directives List of other physicians Hospitalizations, surgeries, and ER visits in previous 12 months Vitals Screenings to include cognitive, depression, and falls Referrals and appointments  No orders of the defined types were placed in this encounter.  In addition, I have reviewed and discussed with patient certain preventive protocols, quality metrics, and best practice recommendations. A written personalized care plan for preventive services as well as general preventive health recommendations were provided to patient.   Lolita Libra, CMA   02/17/2024   Return in 1 year (on 02/16/2025).  After Visit Summary: (MyChart) Due to this being a telephonic visit, the after visit summary with patients personalized plan was offered to patient via MyChart   Nurse Notes: see phone note

## 2024-02-20 NOTE — Progress Notes (Unsigned)
 [facility] at [facility] [address], [address] [place], [place] [address] [phone]  Date:  02/23/2024   Name:  [name]   DOB:  10-31-1950   MRN:  [id number]  PCP:  [name] Harlene BROCKS, MD    Chief Complaint: No chief complaint on file.   History of Present Illness:  [name] is a 73 y.o. very pleasant female patient who presents with the following:  Seen today for follow-up from recent ER visit - I saw her in the office just recently 11/12-  Seen in the ER on 11/21 with chest pain  She also did not read her recent mychart message regarding labs which we can go over today ? Turn off mychart   History of hypertension, CAD, diabetes, sleep apnea, obesity, osteopenia with elevated fracture risk on fosamax  Status post PCI 2010 and 2014 She was seen by neurology this past winter for episodes of tremulousness and memory loss The thinking is that her episodic tremors/aphasia and stuttering are likely due to anxiety and possibly a panic attack She last saw cardiology in [facility] with DES stent to prox. LAD in 2010 with residual disease in LCX 10-20% and RCA 20% lesion. She also has HTN, DM, dyslipidemia and obesity. Most recent cardiac cath 2022 showed Previously placed Prox LAD to Mid LAD stent (unknown type) is widely patent.   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 2. Hepatic steatosis. 3. No evidence of aortic aneurysm or dissection.  Discussed the use of AI scribe software for clinical note transcription with the patient, who gave verbal consent to proceed.  History of Present Illness    Patient Active Problem List   Diagnosis Date Noted   Chest pain, rule out acute myocardial infarction  02/13/2024   Sepsis due to pneumonia (HCC) 08/16/2023   Acute on chronic anemia 08/16/2023   Primary osteoarthritis of right knee 08/30/2021   Cervical radiculopathy 08/16/2021   Heartburn 01/30/2021   Chronic rhinitis 01/30/2021   DOE (dyspnea on exertion) 11/09/2020   Obstructive sleep apnea treated with continuous positive airway pressure (CPAP) 05/07/2018   Coronary artery disease involving native coronary artery of native heart with unstable angina pectoris (HCC) 11/03/2017   Insomnia 11/03/2017   Inadequate sleep hygiene 11/03/2017   Anxiety 07/21/2017   Acquired foot deformity, left 07/18/2016   Osteopenia 11/10/2015   HNP (herniated nucleus pulposus), lumbar 10/12/2014   Spinal stenosis at L4-L5 level 10/12/2014   Iron deficiency anemia 07/29/2014   Type 2 diabetes mellitus (HCC) 07/29/2014   Environmental and seasonal allergies 04/28/2014   Spinal stenosis, lumbar region, with neurogenic claudication 01/06/2013   Obesity, Class II, BMI 35-39.9 10/20/2012   CAD S/P percutaneous coronary angioplasty    Hyperlipidemia associated with type 2 diabetes mellitus (HCC)    Hypertension associated with diabetes (HCC) 04/16/2012    Past Medical History:  Diagnosis Date   Anemia    Arthritis    Bronchitis    hx of    CAD (coronary artery disease)    a. s/p DES to LAD in 2010 with patent stent by cath in 2014 b. low-risk NST in 11/2016   Cataracts, bilateral    Celiac  artery stenosis 2025   Diabetes mellitus without complication (HCC)    Family history of adverse reaction to anesthesia    pts mother had difficulty awakening    GERD (gastroesophageal reflux disease)    Hyperlipidemia LDL goal < 70    Hypertension    Lumbar back pain    Numbness    left leg and foot    Plantar fascia rupture    Left Foot   Sleep apnea    uses CPAP     Past Surgical History:  Procedure Laterality Date   ABDOMINAL HYSTERECTOMY     BACK SURGERY     BREAST CYST ASPIRATION  1995    CAROTID STENT  2009   pt denies    CORONARY ANGIOPLASTY WITH STENT PLACEMENT  2010and 10-02-2012   Stent DES, Xience to prox. LAD   DOPPLER ECHOCARDIOGRAPHY  08/01/2009   EF=>55%,LV normal   GIVENS CAPSULE STUDY N/A 11/11/2023   Procedure: IMAGING PROCEDURE, GI TRACT, INTRALUMINAL, VIA CAPSULE;  Surgeon: Kristie Lamprey, MD;  Location: [facility] ENDOSCOPY;  Service: Gastroenterology;  Laterality: N/A;   LEFT HEART CATH AND CORONARY ANGIOGRAPHY N/A 12/10/2019   Procedure: LEFT HEART CATH AND CORONARY ANGIOGRAPHY;  Surgeon: Claudene Victory ORN, MD;  Location: [facility] INVASIVE CV LAB;  Service: Cardiovascular;  Laterality: N/A;   LEFT HEART CATH AND CORONARY ANGIOGRAPHY N/A 10/30/2020   Procedure: LEFT HEART CATH AND CORONARY ANGIOGRAPHY;  Surgeon: Court Dorn PARAS, MD;  Location: [facility] INVASIVE CV LAB;  Service: Cardiovascular;  Laterality: N/A;   LEFT HEART CATHETERIZATION WITH CORONARY ANGIOGRAM N/A 10/02/2012   Procedure: LEFT HEART CATHETERIZATION WITH CORONARY ANGIOGRAM;  Surgeon: Dorn PARAS Court, MD;  Location: [facility] CATH LAB;  Service: Cardiovascular;  Laterality: N/A;   lower arterial duplex  06/20/10   abi's normal,rgt 0.98,lft 1.06;bilateral PVRs normal   LUMBAR LAMINECTOMY/DECOMPRESSION MICRODISCECTOMY Left 01/06/2013   Procedure: MICRO LUMBAR DECOMPRESSION L4-5 AND L5-S1;  Surgeon: Reyes JAYSON Billing, MD;  Location: [facility] ORS;  Service: Orthopedics;  Laterality: Left;   LUMBAR LAMINECTOMY/DECOMPRESSION MICRODISCECTOMY Left 10/12/2014   Procedure: REVISION MICRO LUMBAR/DECOMPRESSION L4-5 LEFT ;  Surgeon: Reyes Billing, MD;  Location: [facility] ORS;  Service: Orthopedics;  Laterality: Left;   NM MYOCAR PERF WALL MOTION  09/22/2008   lexiscan -EF 83%; glogal LV systolic fx is norm. ,evidence of mild ischemia basal anterior,midanterior and apical lateral region(s).    ORIF PATELLA Right 08/29/2016   Procedure: OPEN REDUCTION INTERNAL (ORIF) FIXATION RIGHT PATELLA;  Surgeon: Fidel Rogue, MD;  Location: [facility] ORS;  Service:  Orthopedics;  Laterality: Right;  Adductor Block   TUBAL LIGATION     TYMPANOPLASTY Bilateral    UVULOPALATOPHARYNGOPLASTY     pt denies     Social History   Tobacco Use   Smoking status: Never   Smokeless tobacco: Never  Vaping Use   Vaping status: Never Used  Substance Use Topics   Alcohol use: Not Currently   Drug use: No    Family History  Problem Relation Age of Onset   Coronary artery disease Mother    Rheum arthritis Mother    Dementia Mother    Heart attack Father    Hypertension Father    Hyperlipidemia Father    Other Father        MVA   Hypertension Brother    Cancer Brother    Heart disease Brother    Cancer Paternal Grandmother        stomach   Diabetes Paternal Grandfather    Breast cancer Neg  Hx     Allergies  Allergen Reactions   Crestor  [Rosuvastatin ] Other (See Comments)    myalgia   Niacin And Related Other (See Comments)    Whelps and skin flushed, mouth tingling    Medication list has been reviewed and updated.  Current Outpatient Medications on File Prior to Visit  Medication Sig Dispense Refill   albuterol  (VENTOLIN  HFA) 108 (90 Base) MCG/ACT inhaler Inhale 2 puffs into the lungs every 6 (six) hours as needed for wheezing or shortness of breath. 8 g 0   amLODipine  (NORVASC ) 10 MG tablet Take 1 tablet (10 mg total) by mouth at bedtime. 90 tablet 3   aspirin  EC 81 MG EC tablet Take 1 tablet (81 mg total) by mouth daily. 30 tablet 1   cholecalciferol  (VITAMIN D ) 1000 UNITS tablet Take 1,000 Units by mouth daily.     clonazePAM  (KLONOPIN ) 0.5 MG tablet Take 0.5-1 tablets (0.25-0.5 mg total) by mouth 2 (two) times daily as needed for anxiety. 30 tablet 1   clopidogrel  (PLAVIX ) 75 MG tablet Take 1 tablet (75 mg total) by mouth daily. 90 tablet 3   donepezil  (ARICEPT ) 5 MG tablet Take 1 tablet (5 mg total) by mouth at bedtime. 90 tablet 3   ezetimibe  (ZETIA ) 10 MG tablet Take 1 tablet (10 mg total) by mouth daily. 90 tablet 3   famotidine   (PEPCID ) 20 MG tablet Take 1 tablet (20 mg total) by mouth daily. 90 tablet 0   fenofibrate  (TRICOR ) 145 MG tablet Take 1 tablet (145 mg) by mouth daily. 90 tablet 3   ferrous sulfate  325 (65 FE) MG tablet Take 325 mg by mouth daily with breakfast.     FLUoxetine  (PROZAC ) 20 MG capsule Take 1 capsule (20 mg total) by mouth daily. 90 capsule 3   fluticasone  (FLONASE ) 50 MCG/ACT nasal spray Place 1 spray into both nostrils daily as needed for allergies or rhinitis. 16 g 11   ibuprofen (ADVIL) 200 MG tablet Take 400 mg by mouth daily as needed for mild pain (knee pain).     icosapent  Ethyl (VASCEPA ) 1 g capsule Take 1 capsule (1 g total) by mouth 2 (two) times daily. (Patient taking differently: Take 1 g by mouth daily.) 180 capsule 3   isosorbide  mononitrate (IMDUR ) 30 MG 24 hr tablet Take 1 tablet (30 mg total) by mouth daily. 90 tablet 3   metFORMIN  (GLUCOPHAGE -XR) 500 MG 24 hr tablet Take 500 mg by mouth daily with breakfast.     Multiple Vitamin (MULTIVITAMIN WITH MINERALS) TABS tablet Take 1 tablet by mouth daily.     nitroGLYCERIN  (NITROSTAT ) 0.4 MG SL tablet Place 1 tablet under the tongue every 5 minutes as needed for chest pain. 25 tablet 2   ondansetron  (ZOFRAN ) 4 MG tablet Take 1 tablet (4 mg total) by mouth every 8 (eight) hours as needed for nausea or vomiting. 20 tablet 0   RABEprazole  (ACIPHEX ) 20 MG tablet Take 1 tablet (20 mg total) by mouth daily 30 minutes prior to food (Patient taking differently: Take 20 mg by mouth See admin instructions. Take 1 tablet by mouth 4 times weekly.) 90 tablet 3   simvastatin  (ZOCOR ) 40 MG tablet Take 1 tablet (40 mg total) by mouth daily. 90 tablet 3   telmisartan  (MICARDIS ) 40 MG tablet Take 1 tablet (40 mg total) by mouth daily. 90 tablet 3   UNABLE TO FIND Take 1 each by mouth daily. Med Name: Viactive Vitamin Caramel     [DISCONTINUED] rosuvastatin  (  CRESTOR ) 20 MG tablet Take 1 tablet (20 mg total) by mouth daily. 30 tablet 3   Current  Facility-Administered Medications on File Prior to Visit  Medication Dose Route Frequency Provider Last Rate Last Admin   denosumab  (PROLIA ) injection 60 mg  60 mg Subcutaneous Q6 months Athanasia Stanwood C, MD   60 mg at 11/10/23 0923   [START ON 05/08/2024] denosumab  (PROLIA ) injection 60 mg  60 mg Subcutaneous Q6 months Anaalicia Reimann, Harlene BROCKS, MD        Review of Systems:  As per HPI- otherwise negative.   Physical Examination: There were no vitals filed for this visit. There were no vitals filed for this visit. There is no height or weight on file to calculate BMI. Ideal Body Weight:    GEN: no acute distress. HEENT: Atraumatic, Normocephalic.  Ears and Nose: No external deformity. CV: RRR, No M/G/R. No JVD. No thrill. No extra heart sounds. PULM: CTA B, no wheezes, crackles, rhonchi. No retractions. No resp. distress. No accessory muscle use. ABD: S, NT, ND, +BS. No rebound. No HSM. EXTR: No c/c/e PSYCH: Normally interactive. Conversant.    Assessment and Plan: No diagnosis found.  Assessment & Plan   Signed Harlene Schroeder, MD

## 2024-02-23 ENCOUNTER — Ambulatory Visit: Admitting: Family Medicine

## 2024-02-23 ENCOUNTER — Other Ambulatory Visit (HOSPITAL_COMMUNITY): Payer: Self-pay

## 2024-02-23 DIAGNOSIS — E1169 Type 2 diabetes mellitus with other specified complication: Secondary | ICD-10-CM

## 2024-02-23 MED ORDER — RYBELSUS 3 MG PO TABS
3.0000 mg | ORAL_TABLET | Freq: Every day | ORAL | 2 refills | Status: AC
  Filled 2024-02-23 – 2024-03-11 (×3): qty 30, 30d supply, fill #0

## 2024-02-24 ENCOUNTER — Ambulatory Visit: Payer: Self-pay

## 2024-02-24 ENCOUNTER — Other Ambulatory Visit: Payer: Self-pay

## 2024-02-24 ENCOUNTER — Emergency Department (HOSPITAL_COMMUNITY)

## 2024-02-24 ENCOUNTER — Emergency Department (HOSPITAL_COMMUNITY)
Admission: EM | Admit: 2024-02-24 | Discharge: 2024-02-24 | Disposition: A | Discharge status: Home / Self Care | Attending: Emergency Medicine | Admitting: Emergency Medicine

## 2024-02-24 ENCOUNTER — Telehealth: Payer: Self-pay | Admitting: Internal Medicine

## 2024-02-24 DIAGNOSIS — I10 Essential (primary) hypertension: Secondary | ICD-10-CM | POA: Diagnosis not present

## 2024-02-24 DIAGNOSIS — D649 Anemia, unspecified: Secondary | ICD-10-CM | POA: Diagnosis not present

## 2024-02-24 DIAGNOSIS — R079 Chest pain, unspecified: Secondary | ICD-10-CM | POA: Diagnosis not present

## 2024-02-24 DIAGNOSIS — D72829 Elevated white blood cell count, unspecified: Secondary | ICD-10-CM | POA: Diagnosis not present

## 2024-02-24 DIAGNOSIS — R0789 Other chest pain: Secondary | ICD-10-CM | POA: Diagnosis not present

## 2024-02-24 DIAGNOSIS — Z7982 Long term (current) use of aspirin: Secondary | ICD-10-CM | POA: Diagnosis not present

## 2024-02-24 DIAGNOSIS — Z79899 Other long term (current) drug therapy: Secondary | ICD-10-CM | POA: Diagnosis not present

## 2024-02-24 DIAGNOSIS — E119 Type 2 diabetes mellitus without complications: Secondary | ICD-10-CM | POA: Diagnosis not present

## 2024-02-24 DIAGNOSIS — I2510 Atherosclerotic heart disease of native coronary artery without angina pectoris: Secondary | ICD-10-CM | POA: Diagnosis not present

## 2024-02-24 LAB — COMPREHENSIVE METABOLIC PANEL WITH GFR
ALT: 25 U/L (ref 0–44)
AST: 25 U/L (ref 15–41)
Albumin: 3.9 g/dL (ref 3.5–5.0)
Alkaline Phosphatase: 54 U/L (ref 38–126)
Anion gap: 10 (ref 5–15)
BUN: 18 mg/dL (ref 8–23)
CO2: 29 mmol/L (ref 22–32)
Calcium: 10.1 mg/dL (ref 8.9–10.3)
Chloride: 102 mmol/L (ref 98–111)
Creatinine, Ser: 0.94 mg/dL (ref 0.44–1.00)
GFR, Estimated: >60 mL/min (ref >60)
Glucose, Bld: 166 mg/dL — ABNORMAL HIGH (ref 70–99)
Potassium: 4.4 mmol/L (ref 3.5–5.1)
Sodium: 141 mmol/L (ref 135–145)
Total Bilirubin: 0.5 mg/dL (ref 0.0–1.2)
Total Protein: 6.9 g/dL (ref 6.5–8.1)

## 2024-02-24 LAB — CBC
HCT: 38.3 % (ref 36.0–46.0)
Hemoglobin: 11.9 g/dL — ABNORMAL LOW (ref 12.0–15.0)
MCH: 25.7 pg — ABNORMAL LOW (ref 26.0–34.0)
MCHC: 31.1 g/dL (ref 30.0–36.0)
MCV: 82.7 fL (ref 80.0–100.0)
Platelets: 329 K/uL (ref 150–400)
RBC: 4.63 MIL/uL (ref 3.87–5.11)
RDW: 14.7 % (ref 11.5–15.5)
WBC: 10.9 K/uL — ABNORMAL HIGH (ref 4.0–10.5)
nRBC: 0 % (ref 0.0–0.2)

## 2024-02-24 LAB — LIPASE, BLOOD: Lipase: 33 U/L (ref 11–51)

## 2024-02-24 LAB — TROPONIN I (HIGH SENSITIVITY)
Troponin I (High Sensitivity): 10 ng/L (ref <18)
Troponin I (High Sensitivity): 10 ng/L (ref <18)

## 2024-02-24 MED ORDER — ALUM & MAG HYDROXIDE-SIMETH 200-200-20 MG/5ML PO SUSP
30.0000 mL | Freq: Once | ORAL | Status: AC
  Administered 2024-02-24: 30 mL via ORAL
  Filled 2024-02-24: qty 30

## 2024-02-24 MED ORDER — LIDOCAINE VISCOUS HCL 2 % MT SOLN
15.0000 mL | Freq: Once | OROMUCOSAL | Status: AC
  Administered 2024-02-24: 15 mL via ORAL
  Filled 2024-02-24: qty 15

## 2024-02-24 MED ORDER — ONDANSETRON 4 MG PO TBDP
4.0000 mg | ORAL_TABLET | Freq: Once | ORAL | Status: AC | PRN
  Administered 2024-02-24: 4 mg via ORAL
  Filled 2024-02-24: qty 1

## 2024-02-24 NOTE — Telephone Encounter (Signed)
 FYI. Pt refused ED. States she will just walk in over at the cardiology office on [address]

## 2024-02-24 NOTE — Telephone Encounter (Signed)
 FYI Only or Action Required?: FYI only for provider: ED advised and refused, CAL notified. Spoke to [place].  Patient was last seen in primary care on 02/23/2024 by Copland, [name] BROCKS, MD.  Called Nurse Triage reporting Chest Pain.  Symptoms began today.  Interventions attempted: Prescription medications: plavix , ASA.  Symptoms are: gradually worsening.  Triage Disposition: Go to ED Now (Notify PCP)  Patient/caregiver understands and will follow disposition?: Unsure   Copied from CRM #[id number]. Topic: Clinical - Red Word Triage >> Feb 24, 2024 11:03 AM [name] wrote: Red Word that prompted transfer to Nurse Triage: really bad chest discomfort Reason for Disposition  [1] Chest pain (or angina) comes and goes AND [2] is happening more often (increasing in frequency) or getting worse (increasing in severity)  (Exception: Chest pains that last only a few seconds.)  Answer Assessment - Initial Assessment Questions Pt called in stating she was very anxious because she has stenosis and was just in the hospital on Saturday, 11/29 before checking out AMA. She felt she was not receiving adequate care or receiving answers. She reports at this time she has a pain at her midline, sternum area that is radiating to her back. While on the phone, RN was able to talk down patient who voiced talking has calmed my anxiety and it is not hurting as bad. I think I just work myself up. Pt did report she was alone d/t husband being out of town. Discussed that she did take her Plavix  and ASA but did not take her prescribed nitroglycerin  because it makes my legs go numb and she was worried if she needed to go somewhere she would be alone. Pt voiced she would prefer to go to facility on [address] to get second opinion from them then if their providers recommend ED, she will go. Discussed importance of having someone drive her and she said her brother may take her. Reiterated importance of safety and having  someone drive her or EMS. She refused EMS. [name], [name]   1. LOCATION: Where does it hurt?       Sternum, midline   2. RADIATION: Does the pain go anywhere else? (e.g., into neck, jaw, arms, back)     Radiating to the back   3. ONSET: When did the chest pain begin? (Minutes, hours or days)      20 minutes ago   4. PATTERN: Does the pain come and go, or has it been constant since it started?  Does it get worse with exertion?      Constant; pushing on area makes it worse  5. DURATION: How long does it last (e.g., seconds, minutes, hours)     20 minutes   6. SEVERITY: How bad is the pain?  (e.g., Scale 1-10; mild, moderate, or severe)     Moderate   7. CARDIAC RISK FACTORS: Do you have any history of heart problems or risk factors for heart disease? (e.g., angina, prior heart attack; diabetes, high blood pressure, high cholesterol, smoker, or strong family history of heart disease)     Hx of angina and stenosis; blockage of LAD with stent   8. PULMONARY RISK FACTORS: Do you have any history of lung disease?  (e.g., blood clots in lung, asthma, emphysema, birth control pills)     None   9. CAUSE: What do you think is causing the chest pain?     Stenosis  10. OTHER SYMPTOMS: Do  you have any other symptoms? (e.g., dizziness, nausea, vomiting, sweating, fever, difficulty breathing, cough)       SOB, anxious  Protocols used: Chest Pain-A-AH

## 2024-02-24 NOTE — Telephone Encounter (Signed)
 Pt c/o of Chest Pain: STAT if active (IN THIS MOMENT) CP, including tightness, pressure, jaw pain, shoulder/upper arm/back pain, SOB, nausea, and vomiting.  1. Are you having CP right now (tightness, pressure, or discomfort)? Yes  2. Are you experiencing any other symptoms (ex. SOB, nausea, vomiting, sweating)? No  3. How long have you been experiencing CP? Since this morning  4. Is your CP continuous or coming and going? Continuous  5. Have you taken Nitroglycerin ? No   6. If CP returns before callback, please consider calling 911. ?

## 2024-02-24 NOTE — Telephone Encounter (Signed)
 Duplicate, please see nurse triage today with RN Duwaine (triage completed and routed to clinic). Closing this encounter. Reason for Disposition  Caller has already spoken with another triager and has no further questions.  Protocols used: No Contact or Duplicate Contact Call-A-AH This encounter was created in error - please disregard.

## 2024-02-24 NOTE — ED Provider Notes (Signed)
 [place] EMERGENCY DEPARTMENT AT [facility] Provider Note   CSN: [id number] Arrival date & time: 02/24/24  1424     Patient presents with: Chest Pain   [name] is a 73 y.o. female past medical history significant for hypertension, diabetes, CAD presents today for chest pain.  With nausea that began around 9 AM this morning.  Patient was recently diagnosed with celiac artery stenosis and has follow-up with vascular on 12/18.  Patient reports that the pain is epigastric in nature.  Patient denies numbness, weakness, vomiting, fever, chills, shortness of breath, or any other complaints at this time.    Chest Pain Associated symptoms: nausea        Prior to Admission medications   Medication Sig Start Date End Date Taking? Authorizing Provider  albuterol  (VENTOLIN  HFA) 108 (90 Base) MCG/ACT inhaler Inhale 2 puffs into the lungs every 6 (six) hours as needed for wheezing or shortness of breath. 08/17/23   Laurence Locus, DO  amLODipine  (NORVASC ) 10 MG tablet Take 1 tablet (10 mg total) by mouth at bedtime. 07/02/23   Copland, Harlene BROCKS, MD  aspirin  EC 81 MG EC tablet Take 1 tablet (81 mg total) by mouth daily. 08/30/16   Swinteck, Redell, MD  cholecalciferol  (VITAMIN D ) 1000 UNITS tablet Take 1,000 Units by mouth daily.    [provider]  clonazePAM  (KLONOPIN ) 0.5 MG tablet Take 0.5-1 tablets (0.25-0.5 mg total) by mouth 2 (two) times daily as needed for anxiety. 11/14/23   Copland, Harlene BROCKS, MD  clopidogrel  (PLAVIX ) 75 MG tablet Take 1 tablet (75 mg total) by mouth daily. 07/02/23 07/01/24  Copland, Harlene BROCKS, MD  donepezil  (ARICEPT ) 5 MG tablet Take 1 tablet (5 mg total) by mouth at bedtime. 02/04/24   Copland, Harlene BROCKS, MD  ezetimibe  (ZETIA ) 10 MG tablet Take 1 tablet (10 mg total) by mouth daily. 07/02/23   Copland, Harlene BROCKS, MD  famotidine  (PEPCID ) 20 MG tablet Take 1 tablet (20 mg total) by mouth daily. 09/08/23   Copland, Harlene BROCKS, MD  fenofibrate  (TRICOR ) 145 MG  tablet Take 1 tablet (145 mg) by mouth daily. 09/02/22   Hilty, Vinie BROCKS, MD  ferrous sulfate  325 (65 FE) MG tablet Take 325 mg by mouth daily with breakfast.    [provider]  FLUoxetine  (PROZAC ) 20 MG capsule Take 1 capsule (20 mg total) by mouth daily. 07/02/23   Copland, Harlene BROCKS, MD  fluticasone  (FLONASE ) 50 MCG/ACT nasal spray Place 1 spray into both nostrils daily as needed for allergies or rhinitis. 02/09/21   Copland, Jessica C, MD  ibuprofen (ADVIL) 200 MG tablet Take 400 mg by mouth daily as needed for mild pain (knee pain).    [provider]  icosapent  Ethyl (VASCEPA ) 1 g capsule Take 1 capsule (1 g total) by mouth 2 (two) times daily. Patient taking differently: Take 1 g by mouth daily. 07/02/23   Copland, Harlene BROCKS, MD  isosorbide  mononitrate (IMDUR ) 30 MG 24 hr tablet Take 1 tablet (30 mg total) by mouth daily. 07/02/23   Copland, Harlene BROCKS, MD  metFORMIN  (GLUCOPHAGE -XR) 500 MG 24 hr tablet Take 500 mg by mouth daily with breakfast.    [provider]  Multiple Vitamin (MULTIVITAMIN WITH MINERALS) TABS tablet Take 1 tablet by mouth daily.    [provider]  nitroGLYCERIN  (NITROSTAT ) 0.4 MG SL tablet Place 1 tablet under the tongue every 5 minutes as needed for chest pain. 07/02/23   Copland, Harlene BROCKS, MD  ondansetron  (ZOFRAN ) 4 MG tablet Take 1 tablet (4 mg total) by mouth every 8 (eight) hours as needed for nausea or vomiting. 11/14/23   Copland, Harlene BROCKS, MD  RABEprazole  (ACIPHEX ) 20 MG tablet Take 1 tablet (20 mg total) by mouth daily 30 minutes prior to food Patient taking differently: Take 20 mg by mouth See admin instructions. Take 1 tablet by mouth 4 times weekly. 05/14/23   Copland, Harlene BROCKS, MD  Semaglutide  (RYBELSUS ) 3 MG TABS Take 1 tablet (3 mg total) by mouth daily. 02/23/24   Copland, Harlene BROCKS, MD  simvastatin  (ZOCOR ) 40 MG tablet Take 1 tablet (40 mg total) by mouth daily. 07/02/23   Copland, Harlene BROCKS, MD  telmisartan  (MICARDIS ) 40 MG  tablet Take 1 tablet (40 mg total) by mouth daily. 04/14/23   Jerilynn Lamarr HERO, NP  UNABLE TO FIND Take 1 each by mouth daily. Med Name: Viactive Vitamin Caramel    [provider]  rosuvastatin  (CRESTOR ) 20 MG tablet Take 1 tablet (20 mg total) by mouth daily. 12/29/19 02/27/20  Emelia Josefa HERO, NP    Allergies: Crestor  [rosuvastatin ] and Niacin and related    Review of Systems  Cardiovascular:  Positive for chest pain.  Gastrointestinal:  Positive for nausea.    Updated Vital Signs BP (!) 158/65   Pulse (!) 55   Temp (!) 97.5 F (36.4 C) (Oral)   Resp 14   Ht 5' 2 (1.575 m)   Wt 88.5 kg   LMP  (LMP Unknown)   SpO2 100%   BMI 35.67 kg/m   Physical Exam Vitals and nursing note reviewed.  Constitutional:      General: She is not in acute distress.    Appearance: She is well-developed. She is not toxic-appearing.  HENT:     Head: Normocephalic and atraumatic.  Eyes:     Conjunctiva/sclera: Conjunctivae normal.  Cardiovascular:     Rate and Rhythm: Normal rate and regular rhythm.     Heart sounds: Normal heart sounds. No murmur heard. Pulmonary:     Effort: Pulmonary effort is normal. No respiratory distress.     Breath sounds: Normal breath sounds.  Abdominal:     Palpations: Abdomen is soft.     Tenderness: There is no abdominal tenderness.  Musculoskeletal:        General: No swelling.     Cervical back: Neck supple.     Right lower leg: No tenderness. No edema.     Left lower leg: No tenderness. No edema.  Skin:    General: Skin is warm and dry.     Capillary Refill: Capillary refill takes less than 2 seconds.  Neurological:     General: No focal deficit present.     Mental Status: She is alert and oriented to person, place, and time.  Psychiatric:        Mood and Affect: Mood normal.     (all labs ordered are listed, but only abnormal results are displayed) Labs Reviewed  COMPREHENSIVE METABOLIC PANEL WITH GFR - Abnormal; Notable for the  following components:      Result Value   Glucose, Bld 166 (*)    All other components within normal limits  CBC - Abnormal; Notable for the following components:   WBC 10.9 (*)    Hemoglobin 11.9 (*)    MCH 25.7 (*)    All other components within normal limits  LIPASE, BLOOD  TROPONIN I (HIGH SENSITIVITY)  TROPONIN I (HIGH SENSITIVITY)  EKG: EKG Interpretation Date/Time:  Tuesday February 24 2024 15:22:26 EST Ventricular Rate:  63 PR Interval:  180 QRS Duration:  154 QT Interval:  444 QTC Calculation: 454 R Axis:   39  Text Interpretation: Normal sinus rhythm Right bundle branch block Abnormal ECG Confirmed by Garrick Charleston [id number]) on 02/24/2024 10:49:12 PM  Radiology: DG Chest 2 View Result Date: 02/24/2024 EXAM: 2 VIEW(S) XRAY OF THE CHEST 02/24/2024 04:17:00 PM COMPARISON: Comparison 02/13/2024. CLINICAL HISTORY: chest pain FINDINGS: LUNGS AND PLEURA: No focal pulmonary opacity. No pleural effusion. No pneumothorax. HEART AND MEDIASTINUM: No acute abnormality of the cardiac and mediastinal silhouettes. BONES AND SOFT TISSUES: No acute osseous abnormality. IMPRESSION: 1. No acute cardiopulmonary process. Electronically signed by: Lynwood Seip MD 02/24/2024 04:23 PM EST RP Workstation: [id number]     Procedures   Medications Ordered in the ED  ondansetron  (ZOFRAN -ODT) disintegrating tablet 4 mg (4 mg Oral Given 02/24/24 1528)  alum & mag hydroxide-simeth (MAALOX/MYLANTA) 200-200-20 MG/5ML suspension 30 mL (30 mLs Oral Given 02/24/24 2300)    And  lidocaine  (XYLOCAINE ) 2 % viscous mouth solution 15 mL (15 mLs Oral Given 02/24/24 2300)                                    Medical Decision Making Amount and/or Complexity of Data Reviewed Labs: ordered. Radiology: ordered.  Risk Prescription drug management.   This patient presents to the ED for concern of CP and nausea, this involves an extensive number of treatment options, and is a complaint that carries with it a  high risk of complications and morbidity.  The differential diagnosis includes angina, STEMI, NSTEMI, GERD, anxiety, arrhythmia, anemia, electrolyte abnormality   Co morbidities / Chronic conditions that complicate the patient evaluation  hypertension, diabetes, CAD   Additional history obtained:  Additional history obtained from EMR External records from outside source obtained and reviewed including cardiology notes   Lab Tests:  I Ordered, and personally interpreted labs.  The pertinent results include: CMP unremarkable, delta troponin 10, mild leukocytosis at 10.9, mild anemia at 11.9   Imaging Studies ordered:  I ordered imaging studies including chest x-ray I independently visualized and interpreted imaging which showed no active cardiopulmonary process I agree with the radiologist interpretation   Cardiac Monitoring: / EKG:  The patient was maintained on a cardiac monitor.  I personally viewed and interpreted the cardiac monitored which showed an underlying rhythm of: Normal sinus rhythm, RBBB   Problem List / ED Course / Critical interventions / Medication management  I ordered medication including GI cocktail   I have reviewed the patients home medicines and have made adjustments as needed  Test / Admission - Considered:  Considered for admission or further workup however patient's vital signs, physical exam, labs, and imaging are reassuring.  Patient advised to follow-up with cardiology and vascular surgery for further evaluation workup.  Patient given return precautions.  I feel patient is safe for discharge at this time.     Final diagnoses:  Chest pain, unspecified type    ED Discharge Orders     None          Francis Ileana LOISE DEVONNA 02/24/24 2301    Garrick Charleston, MD 03/02/24 [phone]

## 2024-02-24 NOTE — Telephone Encounter (Signed)
 Spoke with pt who complains of active CP today.  Pt states she is afraid to take nitroglycerin  as it makes her legs go numb.  Pt encouraged with active symptoms and not being willing to take nitroglycerin  she should be further evaluated in the ED today.  Pt's brother is currently with pt.  Pt verbalizes understanding and is agreeable with plan.

## 2024-02-24 NOTE — Discharge Instructions (Addendum)
 Today you were seen for chest pain.  Your workup on the emergency department was reassuring.  Please follow-up with your cardiologist and vascular surgery for further evaluation and workup.  Thank you for letting us  treat you today. After reviewing your labs and imaging, I feel you are safe to go home. Please follow up with your PCP in the next several days and provide them with your records from this visit. Return to the Emergency Room if pain becomes severe or symptoms worsen.

## 2024-02-24 NOTE — ED Triage Notes (Signed)
 Here by POV from home for CP. Onset 1 hr ago. Did not take ASA or ngt. Rates 6/10. Denies other sx. Recent dx of celiac artery stenosis. Pt alert, NAD, calm, interactive, resps e/u, speaking in clear complete sentences. Sitting in w/c, Skin W&D. Radial pulses strong and RRR.

## 2024-02-24 NOTE — ED Triage Notes (Signed)
 Pt c.o central chest pain that started around 9 am this morning with nausea. Pt was seen here on 11/21 and was told she had atery stenosis, has follow up with vascular on 12/18

## 2024-02-24 NOTE — Telephone Encounter (Signed)
 Call dropped, no answer.    Copied from CRM #[id number]. Topic: Clinical - Red Word Triage >> Feb 24, 2024 10:45 AM [name] wrote: Red Word that prompted transfer to Nurse Triage: chest pain, not related to heart per pt. She was recently diagnosed with celiac artery stenosis. Pt stated the pain is in the center of her chest and feels like it is spasming

## 2024-02-25 ENCOUNTER — Other Ambulatory Visit: Payer: Self-pay

## 2024-02-25 ENCOUNTER — Other Ambulatory Visit: Payer: Self-pay | Admitting: Family Medicine

## 2024-02-25 ENCOUNTER — Other Ambulatory Visit (HOSPITAL_COMMUNITY): Payer: Self-pay

## 2024-02-25 DIAGNOSIS — R251 Tremor, unspecified: Secondary | ICD-10-CM

## 2024-02-25 MED ORDER — CLONAZEPAM 0.5 MG PO TABS
0.2500 mg | ORAL_TABLET | Freq: Two times a day (BID) | ORAL | 1 refills | Status: DC | PRN
  Filled 2024-02-25 – 2024-02-26 (×2): qty 30, 15d supply, fill #0

## 2024-02-26 ENCOUNTER — Telehealth: Payer: Self-pay

## 2024-02-26 ENCOUNTER — Ambulatory Visit: Admitting: Family Medicine

## 2024-02-26 ENCOUNTER — Encounter: Payer: Self-pay | Admitting: Family Medicine

## 2024-02-26 ENCOUNTER — Other Ambulatory Visit (HOSPITAL_BASED_OUTPATIENT_CLINIC_OR_DEPARTMENT_OTHER): Payer: Self-pay

## 2024-02-26 VITALS — BP 124/70 | HR 69 | Ht 62.0 in | Wt 201.2 lb

## 2024-02-26 DIAGNOSIS — F419 Anxiety disorder, unspecified: Secondary | ICD-10-CM

## 2024-02-26 DIAGNOSIS — K224 Dyskinesia of esophagus: Secondary | ICD-10-CM

## 2024-02-26 DIAGNOSIS — R251 Tremor, unspecified: Secondary | ICD-10-CM | POA: Diagnosis not present

## 2024-02-26 MED ORDER — LIDOCAINE VISCOUS HCL 2 % MT SOLN
OROMUCOSAL | 0 refills | Status: AC
  Filled 2024-02-26: qty 180, 6d supply, fill #0

## 2024-02-26 NOTE — Telephone Encounter (Signed)
 Appt this morning w/ PCP.

## 2024-02-26 NOTE — Progress Notes (Signed)
 [facility] [address] [place], [place] [address] [phone]  Date:  02/26/2024   Name:  [name]   DOB:  25-May-1950   MRN:  [id number]  PCP:  [name] Harlene BROCKS, MD    Chief Complaint: Follow-up   History of Present Illness:  [name] is a 72 y.o. very pleasant female patient who presents with the following:  Pt seen today for follow-up We did a phone visit on Monday-today she is seen in person accompanied by her husband [name] Notes from our visit on Monday:  Planned to be seen today for follow-up from recent ER visit - I saw her in the office just recently 11/12-  Seen in the ER on 11/21 with chest pain.  See details below  She also did not read her recent mychart message regarding labs which we can go over today She confirms that she does not use mychart easily- will turn off her account as is causing confusion    History of hypertension, CAD, diabetes, sleep apnea, obesity, osteopenia with elevated fracture risk on fosamax  Status post PCI 2010 and 2014 She was seen by neurology this past winter for episodes of tremulousness and memory loss The thinking is that her episodic tremors/aphasia and stuttering are likely due to anxiety and possibly a panic attack She last saw cardiology in [facility] with DES stent to prox. LAD in 2010 with residual disease in LCX 10-20% and RCA 20% lesion. She also has HTN, DM, dyslipidemia and obesity. Most recent cardiac cath 2022 showed Previously placed Prox LAD to Mid LAD stent (unknown type) is widely patent.    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 2. Hepatic steatosis. 3. No evidence of aortic aneurysm or  dissection.   Went over her recent labs and elevated A1c- I would like to start a GLP -1 for blood sugar control and weight loss and she is amenable to this She is already taking metformin  No family history of thyroid  cancer or MEN Will start her on oral medication for ease of use- Rybelsus  Will start with 3 mg and plan to go up as needed   Lab Results  Component Value Date   HGBA1C 8.6 (H) 02/04/2024  ------------------------------------------------------------------------------------------  She was in the ER on 12/2 with chest pain again-per notes and patient report she responded very well to a GI cocktail, this seemed to immediately relieve her pain.  We suspect that she has been having esophageal spasm  Discussed the use of AI scribe software for clinical note transcription with the patient, who gave verbal consent to proceed.  History of Present Illness [name] is a 73 year old female with celiac artery stenosis who presents with chest pain.  She experienced chest pain and went to the ER on her own, arriving at the hospital around 8 PM. She describes a sensation of tightening when drinking water, which she associates with the celiac artery issue.  She has a history of diabetes, with a recent A1c of 8.6% in November.  She is taking metformin , dose has been limited by side effects.  We again discussed adding Rybelsus and she  would like to go ahead with this.  She does not have any contraindication-I will touch base with her vascular surgeon to make sure there is no concern with her  Shiah admits she is feeling very anxious.  She ran out of her daily use clonazepam  about a week ago; I sent it for a refill yesterday but she has not yet received it from the [facility].  I called our pharmacy here at the [facility] and they are able to provide it for her today  There has also been concern about her memory.  She has been seen by neurology, most recent  visit about a year ago.  I have put in for a neuropsychological evaluation but this is still pending.  Her brain MRI was unremarkable.  Our impression has been that some of her memory issues get a lot worse when she is anxious or under stress  Patient Active Problem List   Diagnosis Date Noted   Chest pain, rule out acute myocardial infarction 02/13/2024   Sepsis due to pneumonia (HCC) 08/16/2023   Acute on chronic anemia 08/16/2023   Primary osteoarthritis of right knee 08/30/2021   Cervical radiculopathy 08/16/2021   Heartburn 01/30/2021   Chronic rhinitis 01/30/2021   DOE (dyspnea on exertion) 11/09/2020   Obstructive sleep apnea treated with continuous positive airway pressure (CPAP) 05/07/2018   Coronary artery disease involving native coronary artery of native heart with unstable angina pectoris (HCC) 11/03/2017   Insomnia 11/03/2017   Inadequate sleep hygiene 11/03/2017   Anxiety 07/21/2017   Acquired foot deformity, left 07/18/2016   Osteopenia 11/10/2015   HNP (herniated nucleus pulposus), lumbar 10/12/2014   Spinal stenosis at L4-L5 level 10/12/2014   Iron deficiency anemia 07/29/2014   Type 2 diabetes mellitus (HCC) 07/29/2014   Environmental and seasonal allergies 04/28/2014   Spinal stenosis, lumbar region, with neurogenic claudication 01/06/2013   Obesity, Class II, BMI 35-39.9 10/20/2012   CAD S/P percutaneous coronary angioplasty    Hyperlipidemia associated with type 2 diabetes mellitus (HCC)    Hypertension associated with diabetes (HCC) 04/16/2012    Past Medical History:  Diagnosis Date   Anemia    Arthritis    Bronchitis    hx of    CAD (coronary artery disease)    a. s/p DES to LAD in 2010 with patent stent by cath in 2014 b. low-risk NST in 11/2016   Cataracts, bilateral    Celiac artery stenosis 2025   Diabetes mellitus without complication (HCC)    Family history of adverse reaction to anesthesia    pts mother had difficulty awakening    GERD  (gastroesophageal reflux disease)    Hyperlipidemia LDL goal < 70    Hypertension    Lumbar back pain    Numbness    left leg and foot    Plantar fascia rupture    Left Foot   Sleep apnea    uses CPAP     Past Surgical History:  Procedure Laterality Date   ABDOMINAL HYSTERECTOMY     BACK SURGERY     BREAST CYST ASPIRATION  1995   CAROTID STENT  2009   pt denies    CORONARY ANGIOPLASTY WITH STENT PLACEMENT  2010and 10-02-2012   Stent DES, Xience to prox. LAD   DOPPLER ECHOCARDIOGRAPHY  08/01/2009   EF=>55%,LV normal   GIVENS CAPSULE STUDY N/A 11/11/2023   Procedure: IMAGING PROCEDURE, GI TRACT, INTRALUMINAL, VIA CAPSULE;  Surgeon: Kristie Lamprey, MD;  Location: [facility];  Service: Gastroenterology;  Laterality: N/A;   LEFT HEART CATH AND CORONARY ANGIOGRAPHY N/A 12/10/2019   Procedure: LEFT HEART CATH AND CORONARY ANGIOGRAPHY;  Surgeon: Claudene Victory ORN, MD;  Location: [facility];  Service: Cardiovascular;  Laterality: N/A;   LEFT HEART CATH AND CORONARY ANGIOGRAPHY N/A 10/30/2020   Procedure: LEFT HEART CATH AND CORONARY ANGIOGRAPHY;  Surgeon: Court Dorn PARAS, MD;  Location: [facility];  Service: Cardiovascular;  Laterality: N/A;   LEFT HEART CATHETERIZATION WITH CORONARY ANGIOGRAM N/A 10/02/2012   Procedure: LEFT HEART CATHETERIZATION WITH CORONARY ANGIOGRAM;  Surgeon: Dorn PARAS Court, MD;  Location: [facility] CATH LAB;  Service: Cardiovascular;  Laterality: N/A;   lower arterial duplex  06/20/10   abi's normal,rgt 0.98,lft 1.06;bilateral PVRs normal   LUMBAR LAMINECTOMY/DECOMPRESSION MICRODISCECTOMY Left 01/06/2013   Procedure: MICRO LUMBAR DECOMPRESSION L4-5 AND L5-S1;  Surgeon: Reyes JAYSON Billing, MD;  Location: [facility];  Service: Orthopedics;  Laterality: Left;   LUMBAR LAMINECTOMY/DECOMPRESSION MICRODISCECTOMY Left 10/12/2014   Procedure: REVISION MICRO LUMBAR/DECOMPRESSION L4-5 LEFT ;  Surgeon: Reyes Billing, MD;  Location: [facility];  Service: Orthopedics;  Laterality: Left;    NM MYOCAR PERF WALL MOTION  09/22/2008   lexiscan -EF 83%; glogal LV systolic fx is norm. ,evidence of mild ischemia basal anterior,midanterior and apical lateral region(s).    ORIF PATELLA Right 08/29/2016   Procedure: OPEN REDUCTION INTERNAL (ORIF) FIXATION RIGHT PATELLA;  Surgeon: Fidel Rogue, MD;  Location: [facility];  Service: Orthopedics;  Laterality: Right;  Adductor Block   TUBAL LIGATION     TYMPANOPLASTY Bilateral    UVULOPALATOPHARYNGOPLASTY     pt denies     Social History   Tobacco Use   Smoking status: Never   Smokeless tobacco: Never  Vaping Use   Vaping status: Never Used  Substance Use Topics   Alcohol use: Not Currently   Drug use: No    Family History  Problem Relation Age of Onset   Coronary artery disease Mother    Rheum arthritis Mother    Dementia Mother    Heart attack Father    Hypertension Father    Hyperlipidemia Father    Other Father        MVA   Hypertension Brother    Cancer Brother    Heart disease Brother    Cancer Paternal Grandmother        stomach   Diabetes Paternal Grandfather    Breast cancer Neg Hx     Allergies  Allergen Reactions   Crestor  [Rosuvastatin ] Other (See Comments)    myalgia   Niacin And Related Other (See Comments)    Whelps and skin flushed, mouth tingling    Medication list has been reviewed and updated.  Current Outpatient Medications on File Prior to Visit  Medication Sig Dispense Refill   albuterol  (VENTOLIN  HFA) 108 (90 Base) MCG/ACT inhaler Inhale 2 puffs into the lungs every 6 (six) hours as needed for wheezing or shortness of breath. 8 g 0   amLODipine  (NORVASC ) 10 MG tablet Take 1 tablet (10 mg total) by mouth at bedtime. 90 tablet 3   aspirin  EC 81 MG EC tablet Take 1 tablet (81 mg total) by mouth daily. 30 tablet 1   cholecalciferol  (VITAMIN D ) 1000 UNITS tablet Take 1,000 Units by mouth daily.     clonazePAM  (KLONOPIN ) 0.5 MG tablet Take 0.5-1 tablets (0.25-0.5 mg total) by mouth 2 (two) times  daily as needed for anxiety. 30 tablet 1  clopidogrel  (PLAVIX ) 75 MG tablet Take 1 tablet (75 mg total) by mouth daily. 90 tablet 3   donepezil  (ARICEPT ) 5 MG tablet Take 1 tablet (5 mg total) by mouth at bedtime. 90 tablet 3   ezetimibe  (ZETIA ) 10 MG tablet Take 1 tablet (10 mg total) by mouth daily. 90 tablet 3   famotidine  (PEPCID ) 20 MG tablet Take 1 tablet (20 mg total) by mouth daily. 90 tablet 0   fenofibrate  (TRICOR ) 145 MG tablet Take 1 tablet (145 mg) by mouth daily. 90 tablet 3   ferrous sulfate  325 (65 FE) MG tablet Take 325 mg by mouth daily with breakfast.     FLUoxetine  (PROZAC ) 20 MG capsule Take 1 capsule (20 mg total) by mouth daily. 90 capsule 3   fluticasone  (FLONASE ) 50 MCG/ACT nasal spray Place 1 spray into both nostrils daily as needed for allergies or rhinitis. 16 g 11   ibuprofen (ADVIL) 200 MG tablet Take 400 mg by mouth daily as needed for mild pain (knee pain).     icosapent  Ethyl (VASCEPA ) 1 g capsule Take 1 capsule (1 g total) by mouth 2 (two) times daily. (Patient taking differently: Take 1 g by mouth daily.) 180 capsule 3   isosorbide  mononitrate (IMDUR ) 30 MG 24 hr tablet Take 1 tablet (30 mg total) by mouth daily. 90 tablet 3   metFORMIN  (GLUCOPHAGE -XR) 500 MG 24 hr tablet Take 500 mg by mouth daily with breakfast.     Multiple Vitamin (MULTIVITAMIN WITH MINERALS) TABS tablet Take 1 tablet by mouth daily.     nitroGLYCERIN  (NITROSTAT ) 0.4 MG SL tablet Place 1 tablet under the tongue every 5 minutes as needed for chest pain. 25 tablet 2   ondansetron  (ZOFRAN ) 4 MG tablet Take 1 tablet (4 mg total) by mouth every 8 (eight) hours as needed for nausea or vomiting. 20 tablet 0   RABEprazole  (ACIPHEX ) 20 MG tablet Take 1 tablet (20 mg total) by mouth daily 30 minutes prior to food (Patient taking differently: Take 20 mg by mouth See admin instructions. Take 1 tablet by mouth 4 times weekly.) 90 tablet 3   Semaglutide (RYBELSUS) 3 MG TABS Take 1 tablet (3 mg total) by  mouth daily. 30 tablet 2   simvastatin  (ZOCOR ) 40 MG tablet Take 1 tablet (40 mg total) by mouth daily. 90 tablet 3   telmisartan  (MICARDIS ) 40 MG tablet Take 1 tablet (40 mg total) by mouth daily. 90 tablet 3   UNABLE TO FIND Take 1 each by mouth daily. Med Name: Viactive Vitamin Caramel     [DISCONTINUED] rosuvastatin  (CRESTOR ) 20 MG tablet Take 1 tablet (20 mg total) by mouth daily. 30 tablet 3   Current Facility-Administered Medications on File Prior to Visit  Medication Dose Route Frequency Provider Last Rate Last Admin   denosumab  (PROLIA ) injection 60 mg  60 mg Subcutaneous Q6 months Sya Nestler C, MD   60 mg at 11/10/23 0923   [START ON 05/08/2024] denosumab  (PROLIA ) injection 60 mg  60 mg Subcutaneous Q6 months Shields Pautz, Harlene BROCKS, MD        Review of Systems:  As per HPI- otherwise negative.   Physical Examination: Vitals:   02/26/24 0948  BP: 124/70  Pulse: 69  SpO2: 96%   Vitals:   02/26/24 0948  Weight: 201 lb 3.2 oz (91.3 kg)  Height: 5' 2 (1.575 m)   Body mass index is 36.8 kg/m. Ideal Body Weight: Weight in (lb) to have BMI = 25: 136.4  GEN:  no acute distress.  Appears her normal self, obese, comfortable.  She has been more confused over the last year or so.  It seems that some of this is due to anxiety, when she has anxious her memory is much worse She is accompanied by her husband [name] today HEENT: Atraumatic, Normocephalic.  Ears and Nose: No external deformity. CV: RRR, No M/G/R. No JVD. No thrill. No extra heart sounds. PULM: CTA B, no wheezes, crackles, rhonchi. No retractions. No resp. distress. No accessory muscle use. ABD: S, NT, ND, +BS. No rebound. No HSM. EXTR: No c/c/e PSYCH: Normally interactive. Conversant.    Assessment and Plan: Esophageal spasm - Plan: lidocaine -alum & mag hydroxide-simeth  Tremor  Anxiety  Assessment & Plan Celiac artery stenosis Confirmed as the cause of chest pain, not cardiac in origin. Symptoms include  tightening sensation after drinking water, possibly related to esophageal spasm. Anxiety may exacerbate symptoms. - Referred to vascular surgery for further evaluation and management. - Scheduled initial visit with vascular surgeon on December 18th. - Scheduled follow-up with Dr. Serene on January 5th. - Advised rest and relaxation during episodes of pain. - Discussed potential for stent placement to address stenosis. - Recent episode of chest/epigastric pain resolved with GI cocktail.  I sent a prescription for GI cocktail with viscous lidocaine  to our pharmacy downstairs that she can use as needed  Type 2 diabetes mellitus Recent A1c of 8.6% in November, indicating suboptimal control. Blood sugar was 189 mg/dL recently. Rybelsus prescribed to improve glycemic control. Insurance may require dose escalation from 3 mg to 7 mg after one month. Good glycemic control is important for potential surgical intervention for celiac artery stenosis. - Prescribed Rybelsus 3 mg daily, with plan to increase to 7 mg after one month if tolerated. - Monitor blood sugar levels and A1c. - Ensure good glycemic control for potential surgical intervention.  Generalized anxiety disorder Anxiety exacerbated by recent family stressors, including son's accident. Clonazepam  prescription refilled. Anxiety may contribute to physical symptoms such as chest pain and arm pain. - Refilled clonazepam  prescription-pharmacy on site can provide immediately.  She has been out of this daily medication for about a week   Signed Harlene Schroeder, MD

## 2024-02-26 NOTE — Telephone Encounter (Signed)
 Copied from CRM [id number]. Topic: Clinical - Prescription Issue >> Feb 25, 2024  1:36 PM Macario HERO wrote: Reason for CRM: Patient called to leave a message with Dr. Watt that she is having a lot of pain in the middle of her abdomen and went to the emergency room. Patient stated she was giving a medication mix which helped the pain and so she is requesting a prescription for liquid lidocaine  with Maalox or Mylanta. Requesting a follow up. >> Feb 25, 2024  5:40 PM Nurse Olam BROCKS wrote: Patient has appointment 02/26/24.

## 2024-02-26 NOTE — Patient Instructions (Signed)
 Good to see you today The pharmacy here will fill your klonopin  and also your GI cocktail which you can use when you have esophageal spasm pain!

## 2024-03-01 ENCOUNTER — Other Ambulatory Visit (HOSPITAL_COMMUNITY): Payer: Self-pay

## 2024-03-08 NOTE — Progress Notes (Unsigned)
 Cardiology Office Note:  .   Date:  03/11/2024  ID:  [name], DOB 13-May-1950, MRN [id number] PCP: [name] Harlene BROCKS, MD  [facility] Cardiologist:  [name] Maxcy, MD {  History of Present Illness: .   [name] is a 73 y.o. female with history of CAD with DES to pLAD 2010, RBBB, hypertension, diabetes, hyperlipidemia, OSA, celiac artery stenosis.     CAD 2010 stent placed to LAD 2021/2022 LHC with patent LAD stent.  No other obstructive disease noted.  EF has been normal with no significant valve disease.  Social history  No history of tobacco, alcohol, drug use.  Lightly active 1 to 2 days/week. Damien history of MI      Patient with remote PCI to pLAD 2010 stable on heart catheterization 2022 which were prompted by atypical chest pain.  01/2024 she was seen in the ED for chest pain with negative troponins.  Abdominal pain was not exertional, relieved with nitroglycerin , pain reproducible with palpation, reported anxiety.  Critical celiac artery stenosis was noted on CT.  Vascular consulted with no plans to intervene.  She was seen again in the ER 12/2 with complaints of chest pain responded well with GI cocktail.  Seen by PCP at follow up with suspicion of esophageal spasm.  Symptoms included tightening sensation after drinking water.  Referred to vascular surgery.  Patient is here today for hospital follow-up.  She continues to report intermittent/distant complaints of abdominal pain.  She is very concerned about the symptoms.  Having pain today.  Reports symptoms are often worse around eating and drinking liquids/food.  Has pain when she leans forward, relieved when sitting back.  Pain is responsive to nitroglycerin .  Does not have a clear exertional component.  She also reports palpable, reproducible right arm/mid axillary arm pain.  Reported right knee pain, not exertional, no calf pain.  Symptoms not obviously relieved with rest.  She is accompanied with  her husband today who should be reaching the future as she frequently forgets things and mistakenly showed up 3 hours prior to her appointment today.  ROS: Denies: shortness of breath, orthopnea, peripheral edema, palpitations, decreased exercise intolerance, fatigue, lightheadedness.   Studies Reviewed: .         Risk Assessment/Calculations:        Physical Exam:   VS:  BP (!) 182/73   Pulse (!) 56   Ht 5' 2 (1.575 m)   Wt 202 lb (91.6 kg)   LMP  (LMP Unknown)   SpO2 97%   BMI 36.95 kg/m    Wt Readings from Last 3 Encounters:  03/11/24 202 lb (91.6 kg)  02/26/24 201 lb 3.2 oz (91.3 kg)  02/24/24 195 lb (88.5 kg)    GEN: Well nourished, well developed in no acute distress NECK: No JVD; No carotid bruits CARDIAC: RRR, no murmurs, rubs, gallops RESPIRATORY:  Clear to auscultation without rales, wheezing or rhonchi  ABDOMEN: Soft, non-tender, non-distended EXTREMITIES:  No edema; No deformity   ASSESSMENT AND PLAN: .    Abdominal pain Not suspected to be cardiac in nature.  Celiac artery stenosis and esophageal spasm is on the differential given she has had worsening symptoms after drinking water and with food which is consistent with her critical celiac artery stenosis versus occlusion in which she already has a appointment scheduled with vascular surgery.  Also reported alleviating pain after GI cocktail.  Suspect some element of anxiety as well.  She has had  reassuring cardiac workup with negative troponins and stable EKG so suspicion for progressive CAD felt to be less likely.   Would like to see what vascular surgery says about her current presentation.  Before considering further cardiac workup. We discussed possibly pursuing NM PET stress testing if etiology of her pain is still unclear after their workup.  CAD with DES to LAD 2010 -2021/2022 widely patent LAD stent, no obstructive CAD elsewhere As above, her presentation seems to be noncardiac.  Reassuring ischemic  evaluations. Chronically has been on DAPT therapy with aspirin  and Plavix .  Suspect she can go on monotherapy now but message MD for final decision.  Baseline heart rates bradycardic so no need for beta-blocker. Continue with antianginals: Amlodipine  10 mg and Imdur  30 mg. LDL 96 9 days ago, goal less than 55.  She has had issues with myalgias in the past with statins, but tolerates simvastatin  40 mg, Zetia .  Sending to lipid clinic to help arrange PCSK9 inhibitor. Try to limit use of NSAIDs, occasionally takes this as needed for her knee.   Critical celiac artery stenosis versus occlusion We will try to expedite her referral/evaluation.  Appointment was scheduled for 1/5, she will be seen 12/23 now.  OSA  Previously noncompliant but now compliant.  Diabetes A1c 8.6%.  Needs better control.  Right knee pain Does not clearly demonstrate claudication symptoms that would be consistent with PAD.  This is anterior knee pain with prior procedure in the same location.  Symptoms not clearly exertional or relieved with rest.  Will hold off on ABIs for the time being.  Hypertension Normally has very well-controlled blood pressure, she is in a lot of pain today and anxious so likely artificially elevated.  Log blood pressure at home.    Dispo: Follow-up in 3 months.  Hopefully by that time he will have better idea of her vascular workup.  Could consider further cardiac testing at that time if indicated.  Addendum: Case discussed with Dr. Mona.  Okay to continue with aspirin  monotherapy.  Will call patient to stop Plavix .  Signed, Thom LITTIE Sluder, PA-C

## 2024-03-10 ENCOUNTER — Ambulatory Visit: Payer: Self-pay

## 2024-03-10 NOTE — Telephone Encounter (Signed)
 FYI Only or Action Required?: FYI only for provider: pt urged to go to urgent care today, pt agreeable.  Patient was last seen in primary care on 02/26/2024 by Copland, Harlene BROCKS, MD.  Called Nurse Triage reporting bilat underarm swelling, like eggs per patient, tender to touch  Symptoms began yesterday.  Interventions attempted: Nothing.  Symptoms are: gradually worsening.  Triage Disposition: See Physician Within 24 Hours  Patient/caregiver understands and will follow disposition?: Yes   Reason for Disposition  [1] Swelling is painful to touch AND [2] no fever  Answer Assessment - Initial Assessment Questions 1. APPEARANCE of SWELLING: What does it look like?     Huge lymphnodes to bilat armpits, on the R side pt describes the lymph node as the size of an egg and the L side as big as a walnut 3. LOCATION: Where is the swelling located?     Under bilat arms 4. ONSET: When did the swelling start?     Yesterday 6. PAIN: Is there any pain? If Yes, ask: How bad is the pain? (Scale 1-10; or mild, moderate, severe)       Yes, 3/10 upon palpation 7. ITCH: Does it itch? If Yes, ask: How bad is the itch?      Denies 8. CAUSE: What do you think caused the swelling?     Pt unsure 9 OTHER SYMPTOMS: Do you have any other symptoms? (e.g., fever)     R Shoulder feels like a heavy bruise, constant ache per patient  Protocols used: Skin Lump or Localized Swelling-A-AH

## 2024-03-10 NOTE — Telephone Encounter (Signed)
 Pt going to [place]

## 2024-03-11 ENCOUNTER — Ambulatory Visit: Attending: Cardiovascular Disease | Admitting: Cardiology

## 2024-03-11 ENCOUNTER — Encounter: Payer: Self-pay | Admitting: Cardiology

## 2024-03-11 ENCOUNTER — Telehealth (HOSPITAL_COMMUNITY): Payer: Self-pay

## 2024-03-11 ENCOUNTER — Telehealth (HOSPITAL_COMMUNITY): Payer: Self-pay | Admitting: Pharmacy Technician

## 2024-03-11 ENCOUNTER — Telehealth: Payer: Self-pay

## 2024-03-11 ENCOUNTER — Other Ambulatory Visit (HOSPITAL_COMMUNITY): Payer: Self-pay

## 2024-03-11 VITALS — BP 182/73 | HR 56 | Ht 62.0 in | Wt 202.0 lb

## 2024-03-11 DIAGNOSIS — I2510 Atherosclerotic heart disease of native coronary artery without angina pectoris: Secondary | ICD-10-CM

## 2024-03-11 DIAGNOSIS — R1010 Upper abdominal pain, unspecified: Secondary | ICD-10-CM | POA: Diagnosis not present

## 2024-03-11 DIAGNOSIS — E7800 Pure hypercholesterolemia, unspecified: Secondary | ICD-10-CM

## 2024-03-11 DIAGNOSIS — G4733 Obstructive sleep apnea (adult) (pediatric): Secondary | ICD-10-CM | POA: Diagnosis not present

## 2024-03-11 DIAGNOSIS — I10 Essential (primary) hypertension: Secondary | ICD-10-CM | POA: Diagnosis not present

## 2024-03-11 DIAGNOSIS — E785 Hyperlipidemia, unspecified: Secondary | ICD-10-CM

## 2024-03-11 DIAGNOSIS — Z789 Other specified health status: Secondary | ICD-10-CM | POA: Diagnosis not present

## 2024-03-11 NOTE — Telephone Encounter (Signed)
°  The patient was notified that [place], [place] spoke to her cardiologist, the patient can stop Plavix  and continue with aspirin  monotherapy

## 2024-03-11 NOTE — Telephone Encounter (Signed)
 Pharmacy Patient Advocate Encounter   Received notification from Pt Calls Messages that prior authorization for Rybelsus  3MG  tablets  is required/requested.   Insurance verification completed.   The patient is insured through [facility] ADVANTAGE/RX ADVANCE.   Per test claim: PA required; PA submitted to above mentioned insurance via Latent Key/confirmation #/EOC [id number] Status is pending

## 2024-03-11 NOTE — Patient Instructions (Addendum)
 Medication Instructions:  Your physician recommends that you continue on your current medications as directed. Please refer to the Current Medication list given to you today.  *If you need a refill on your cardiac medications before your next appointment, please call your pharmacy*  Lab Work: None  If you have labs (blood work) drawn today and your tests are completely normal, you will receive your results only by: MyChart Message (if you have MyChart) OR A paper copy in the mail If you have any lab test that is abnormal or we need to change your treatment, we will call you to review the results.  Testing/Procedures: None   Follow-Up: At [facility], you and your health needs are our priority.  As part of our continuing mission to provide you with exceptional heart care, our providers are all part of one team.  This team includes your primary Cardiologist (physician) and Advanced Practice Providers or APPs (Physician Assistants and Nurse Practitioners) who all work together to provide you with the care you need, when you need it.  Your next appointment:   3  month(s)  Provider:   Vinie JAYSON Maxcy, MD or Thom Sluder, PA-C   We recommend signing up for the patient portal called MyChart.  Sign up information is provided on this After Visit Summary.  MyChart is used to connect with patients for Virtual Visits (Telemedicine).  Patients are able to view lab/test results, encounter notes, upcoming appointments, etc.  Non-urgent messages can be sent to your provider as well.   To learn more about what you can do with MyChart, go to [email or web address].   Other Instructions Lipid Clinic Referral for statin intolerance.

## 2024-03-11 NOTE — Telephone Encounter (Signed)
 PA request has been Received. New Encounter has been or will be created for follow up. For additional info see Pharmacy Prior Auth telephone encounter from 03/11/24.

## 2024-03-11 NOTE — Telephone Encounter (Signed)
 Pharmacy Patient Advocate Encounter  Received notification from [facility] [employer] that Prior Authorization for Rybelsus  3MG  tablets  has been APPROVED from 03/11/24 to 03/11/25. Ran test claim, Copay is $0. This test claim was processed through [facility]- copay amounts may vary at other pharmacies due to pharmacy/plan contracts, or as the patient moves through the different stages of their insurance plan.   PA #/Case ID/Reference #: [id number]

## 2024-03-15 NOTE — Progress Notes (Unsigned)
 "   Patient name: [name] MRN: [id number] DOB: 1950/11/21 Sex: female  REASON FOR CONSULT: Celiac artery stenosis  HPI: [name] is a 73 y.o. female, with a history of coronary disease, diabetes, hypertension, hyperlipidemia that presents for evaluation of celiac artery stenosis.  Patient was admitted with chest pain on 02/13/2024.  She states this was actually epigastric pain. She did have a CTA chest abdomen pelvis on 02/13/2024 with critical stenosis versus occlusion of the celiac artery of note the SMA and IMA were patent.  States she has had some intermittent abdominal pain but this is much better.  No weight loss.  BMI is 36.  Past Medical History:  Diagnosis Date   Anemia    Arthritis    Bronchitis    hx of    CAD (coronary artery disease)    a. s/p DES to LAD in 2010 with patent stent by cath in 2014 b. low-risk NST in 11/2016   Cataracts, bilateral    Celiac artery stenosis 2025   Diabetes mellitus without complication (HCC)    Family history of adverse reaction to anesthesia    pts mother had difficulty awakening    GERD (gastroesophageal reflux disease)    Hyperlipidemia LDL goal < 70    Hypertension    Lumbar back pain    Numbness    left leg and foot    Plantar fascia rupture    Left Foot   Sleep apnea    uses CPAP     Past Surgical History:  Procedure Laterality Date   ABDOMINAL HYSTERECTOMY     BACK SURGERY     BREAST CYST ASPIRATION  1995   CAROTID STENT  2009   pt denies    CORONARY ANGIOPLASTY WITH STENT PLACEMENT  2010and 10-02-2012   Stent DES, Xience to prox. LAD   DOPPLER ECHOCARDIOGRAPHY  08/01/2009   EF=>55%,LV normal   GIVENS CAPSULE STUDY N/A 11/11/2023   Procedure: IMAGING PROCEDURE, GI TRACT, INTRALUMINAL, VIA CAPSULE;  Surgeon: Kristie Lamprey, MD;  Location: [facility] ENDOSCOPY;  Service: Gastroenterology;  Laterality: N/A;   LEFT HEART CATH AND CORONARY ANGIOGRAPHY N/A 12/10/2019   Procedure: LEFT HEART CATH AND CORONARY ANGIOGRAPHY;   Surgeon: Claudene Victory ORN, MD;  Location: [facility] INVASIVE CV LAB;  Service: Cardiovascular;  Laterality: N/A;   LEFT HEART CATH AND CORONARY ANGIOGRAPHY N/A 10/30/2020   Procedure: LEFT HEART CATH AND CORONARY ANGIOGRAPHY;  Surgeon: Court Dorn JINNY, MD;  Location: [facility] INVASIVE CV LAB;  Service: Cardiovascular;  Laterality: N/A;   LEFT HEART CATHETERIZATION WITH CORONARY ANGIOGRAM N/A 10/02/2012   Procedure: LEFT HEART CATHETERIZATION WITH CORONARY ANGIOGRAM;  Surgeon: Dorn JINNY Court, MD;  Location: [facility] CATH LAB;  Service: Cardiovascular;  Laterality: N/A;   lower arterial duplex  06/20/10   abi's normal,rgt 0.98,lft 1.06;bilateral PVRs normal   LUMBAR LAMINECTOMY/DECOMPRESSION MICRODISCECTOMY Left 01/06/2013   Procedure: MICRO LUMBAR DECOMPRESSION L4-5 AND L5-S1;  Surgeon: Reyes JAYSON Billing, MD;  Location: [facility] ORS;  Service: Orthopedics;  Laterality: Left;   LUMBAR LAMINECTOMY/DECOMPRESSION MICRODISCECTOMY Left 10/12/2014   Procedure: REVISION MICRO LUMBAR/DECOMPRESSION L4-5 LEFT ;  Surgeon: Reyes Billing, MD;  Location: [facility] ORS;  Service: Orthopedics;  Laterality: Left;   NM MYOCAR PERF WALL MOTION  09/22/2008   lexiscan -EF 83%; glogal LV systolic fx is norm. ,evidence of mild ischemia basal anterior,midanterior and apical lateral region(s).    ORIF PATELLA Right 08/29/2016   Procedure: OPEN REDUCTION INTERNAL (ORIF) FIXATION RIGHT PATELLA;  Surgeon: Fidel Rogue, MD;  Location: [facility] ORS;  Service: Orthopedics;  Laterality: Right;  Adductor Block   TUBAL LIGATION     TYMPANOPLASTY Bilateral    UVULOPALATOPHARYNGOPLASTY     pt denies     Family History  Problem Relation Age of Onset   Coronary artery disease Mother    Rheum arthritis Mother    Dementia Mother    Heart attack Father    Hypertension Father    Hyperlipidemia Father    Other Father        MVA   Hypertension Brother    Cancer Brother    Heart disease Brother    Cancer Paternal Grandmother        stomach   Diabetes Paternal  Grandfather    Breast cancer Neg Hx     SOCIAL HISTORY: Social History   Socioeconomic History   Marital status: Married    Spouse name: Interior And Spatial Designer   Number of children: 2   Years of education: MSN   Highest education level: Master's degree (e.g., MA, MS, MEng, MEd, MSW, MBA)  Occupational History   Occupation: DIRECTOR    Employer: [employer]  Tobacco Use   Smoking status: Never   Smokeless tobacco: Never  Vaping Use   Vaping status: Never Used  Substance and Sexual Activity   Alcohol use: Not Currently   Drug use: No   Sexual activity: Yes  Other Topics Concern   Not on file  Social History Narrative   Married to [place]. Education: [employer]. Exercise: Yes   Patient lives at home with her spouse. retired   Caffeine use: 1 drink of tea a day   Social Drivers of Health   Tobacco Use: Low Risk (03/11/2024)   Patient History    Smoking Tobacco Use: Never    Smokeless Tobacco Use: Never    Passive Exposure: Not on file  Financial Resource Strain: Medium Risk (06/25/2022)   Overall Financial Resource Strain (CARDIA)    Difficulty of Paying Living Expenses: Somewhat hard  Food Insecurity: No Food Insecurity (02/17/2024)   Epic    Worried About Programme Researcher, Broadcasting/film/video in the Last Year: Never true    Ran Out of Food in the Last Year: Never true  Transportation Needs: No Transportation Needs (02/17/2024)   Epic    Lack of Transportation (Medical): No    Lack of Transportation (Non-Medical): No  Physical Activity: Sufficiently Active (02/17/2024)   Exercise Vital Sign    Days of Exercise per Week: 6 days    Minutes of Exercise per Session: 30 min  Stress: Stress Concern Present (02/17/2024)   [employer] of Occupational Health - Occupational Stress Questionnaire    Feeling of Stress: Rather much  Social Connections: Socially Integrated (02/17/2024)   Social Connection and Isolation Panel    Frequency of Communication with Friends and Family: More than three times a week     Frequency of Social Gatherings with Friends and Family: More than three times a week    Attends Religious Services: More than 4 times per year    Active Member of Clubs or Organizations: Yes    Attends Banker Meetings: More than 4 times per year    Marital Status: Married  Catering Manager Violence: Not At Risk (02/17/2024)   Epic    Fear of Current or Ex-Partner: No    Emotionally Abused: No    Physically Abused: No    Sexually Abused: No  Depression (PHQ2-9): Low Risk (02/17/2024)   Depression (PHQ2-9)  PHQ-2 Score: 4  Alcohol Screen: Low Risk (06/25/2022)   Alcohol Screen    Last Alcohol Screening Score (AUDIT): 1  Housing: Low Risk (02/17/2024)   Epic    Unable to Pay for Housing in the Last Year: No    Number of Times Moved in the Last Year: 0    Homeless in the Last Year: No  Utilities: Not At Risk (02/17/2024)   Epic    Threatened with loss of utilities: No  Health Literacy: Not on file    Allergies[1]  Current Outpatient Medications  Medication Sig Dispense Refill   albuterol  (VENTOLIN  HFA) 108 (90 Base) MCG/ACT inhaler Inhale 2 puffs into the lungs every 6 (six) hours as needed for wheezing or shortness of breath. 8 g 0   amLODipine  (NORVASC ) 10 MG tablet Take 1 tablet (10 mg total) by mouth at bedtime. 90 tablet 3   aspirin  EC 81 MG EC tablet Take 1 tablet (81 mg total) by mouth daily. 30 tablet 1   cholecalciferol  (VITAMIN D ) 1000 UNITS tablet Take 1,000 Units by mouth daily.     clonazePAM  (KLONOPIN ) 0.5 MG tablet Take 0.5-1 tablets (0.25-0.5 mg total) by mouth 2 (two) times daily as needed for anxiety. 30 tablet 1   clopidogrel  (PLAVIX ) 75 MG tablet Take 1 tablet (75 mg total) by mouth daily. 90 tablet 3   donepezil  (ARICEPT ) 5 MG tablet Take 1 tablet (5 mg total) by mouth at bedtime. 90 tablet 3   ezetimibe  (ZETIA ) 10 MG tablet Take 1 tablet (10 mg total) by mouth daily. 90 tablet 3   famotidine  (PEPCID ) 20 MG tablet Take 1 tablet (20 mg total)  by mouth daily. 90 tablet 0   fenofibrate  (TRICOR ) 145 MG tablet Take 1 tablet (145 mg) by mouth daily. 90 tablet 3   ferrous sulfate  325 (65 FE) MG tablet Take 325 mg by mouth daily with breakfast.     FLUoxetine  (PROZAC ) 20 MG capsule Take 1 capsule (20 mg total) by mouth daily. 90 capsule 3   fluticasone  (FLONASE ) 50 MCG/ACT nasal spray Place 1 spray into both nostrils daily as needed for allergies or rhinitis. 16 g 11   ibuprofen (ADVIL) 200 MG tablet Take 400 mg by mouth daily as needed for mild pain (knee pain).     icosapent  Ethyl (VASCEPA ) 1 g capsule Take 1 capsule (1 g total) by mouth 2 (two) times daily. (Patient taking differently: Take 1 g by mouth daily.) 180 capsule 3   isosorbide  mononitrate (IMDUR ) 30 MG 24 hr tablet Take 1 tablet (30 mg total) by mouth daily. 90 tablet 3   lidocaine -alum & mag hydroxide-simeth Drink 30 ml by mouth once daily as needed for esophageal spasm 180 mL 0   metFORMIN  (GLUCOPHAGE -XR) 500 MG 24 hr tablet Take 500 mg by mouth daily with breakfast.     Multiple Vitamin (MULTIVITAMIN WITH MINERALS) TABS tablet Take 1 tablet by mouth daily.     nitroGLYCERIN  (NITROSTAT ) 0.4 MG SL tablet Place 1 tablet under the tongue every 5 minutes as needed for chest pain. 25 tablet 2   ondansetron  (ZOFRAN ) 4 MG tablet Take 1 tablet (4 mg total) by mouth every 8 (eight) hours as needed for nausea or vomiting. 20 tablet 0   RABEprazole  (ACIPHEX ) 20 MG tablet Take 1 tablet (20 mg total) by mouth daily 30 minutes prior to food (Patient taking differently: Take 20 mg by mouth See admin instructions. Take 1 tablet by mouth 4 times weekly.) 90 tablet  3   Semaglutide  (RYBELSUS ) 3 MG TABS Take 1 tablet (3 mg total) by mouth daily. 30 tablet 2   simvastatin  (ZOCOR ) 40 MG tablet Take 1 tablet (40 mg total) by mouth daily. 90 tablet 3   telmisartan  (MICARDIS ) 40 MG tablet Take 1 tablet (40 mg total) by mouth daily. 90 tablet 3   UNABLE TO FIND Take 1 each by mouth daily. Med Name:  Viactive Vitamin Caramel     Current Facility-Administered Medications  Medication Dose Route Frequency Provider Last Rate Last Admin   denosumab  (PROLIA ) injection 60 mg  60 mg Subcutaneous Q6 months Copland, Jessica C, MD   60 mg at 11/10/23 0923   [START ON 05/08/2024] denosumab  (PROLIA ) injection 60 mg  60 mg Subcutaneous Q6 months Copland, Harlene BROCKS, MD        REVIEW OF SYSTEMS:  [X]  denotes positive finding, [ ]  denotes negative finding Cardiac  Comments:  Chest pain or chest pressure:    Shortness of breath upon exertion:    Short of breath when lying flat:    Irregular heart rhythm:        Vascular    Pain in calf, thigh, or hip brought on by ambulation:    Pain in feet at night that wakes you up from your sleep:     Blood clot in your veins:    Leg swelling:         Pulmonary    Oxygen at home:    Productive cough:     Wheezing:         Neurologic    Sudden weakness in arms or legs:     Sudden numbness in arms or legs:     Sudden onset of difficulty speaking or slurred speech:    Temporary loss of vision in one eye:     Problems with dizziness:         Gastrointestinal    Blood in stool:     Vomited blood:         Genitourinary    Burning when urinating:     Blood in urine:        Psychiatric    Major depression:         Hematologic    Bleeding problems:    Problems with blood clotting too easily:        Skin    Rashes or ulcers:        Constitutional    Fever or chills:      PHYSICAL EXAM: There were no vitals filed for this visit.  GENERAL: The patient is a well-nourished female, in no acute distress. The vital signs are documented above. CARDIAC: There is a regular rate and rhythm.  VASCULAR:  Bilateral femoral pulses palpable PULMONARY: No respiratory distress. ABDOMEN: Soft and non-tender . MUSCULOSKELETAL: There are no major deformities or cyanosis. NEUROLOGIC: No focal weakness or paresthesias are detected. SKIN: There are no ulcers or  rashes noted. PSYCHIATRIC: The patient has a normal affect.  DATA:   CT reviewed 02/13/2024 with critical stenosis versus occlusion of the celiac artery but I widely patent SMA and IMA that is patent  Assessment/Plan:   73 y.o. female, with a history of coronary disease, diabetes, hypertension, hyperlipidemia that presents for evaluation of celiac artery stenosis.  Patient was admitted with chest pain on 02/13/2024 and she states this was actually epigastric pain.  She did have a CTA chest abdomen pelvis on 02/13/2024 with critical stenosis versus occlusion of  the celiac artery of note the SMA and IMA were patent.  No classic symptoms of mesenteric ischemia on evaluation today.  I think this is an incidental finding unrelated to her admission.  Typically for chronic mesenteric ischemia need significant disease in 2 out of 3 vessels and she has a widely patent SMA and IMA.  No weight loss.  Eating okay.  I will follow-up in 1 year with repeat mesenteric duplex.   Lonni DOROTHA Gaskins, MD [facility] Office: [phone]        [1]  Allergies Allergen Reactions   Crestor  [Rosuvastatin ] Other (See Comments)    myalgia   Niacin And Related Other (See Comments)    Whelps and skin flushed, mouth tingling   "

## 2024-03-16 ENCOUNTER — Ambulatory Visit (HOSPITAL_COMMUNITY)
Admission: RE | Admit: 2024-03-16 | Discharge: 2024-03-16 | Disposition: A | Discharge status: Home / Self Care | Source: Ambulatory Visit | Attending: Surgery | Admitting: Surgery

## 2024-03-16 ENCOUNTER — Encounter: Payer: Self-pay | Admitting: Vascular Surgery

## 2024-03-16 ENCOUNTER — Ambulatory Visit: Admitting: Vascular Surgery

## 2024-03-16 VITALS — BP 151/62 | HR 55 | Temp 97.9°F | Resp 20 | Ht 62.0 in | Wt 202.0 lb

## 2024-03-16 DIAGNOSIS — I774 Celiac artery compression syndrome: Secondary | ICD-10-CM | POA: Insufficient documentation

## 2024-03-19 ENCOUNTER — Telehealth: Payer: Self-pay | Admitting: Pharmacist

## 2024-03-19 ENCOUNTER — Other Ambulatory Visit (HOSPITAL_COMMUNITY): Payer: Self-pay

## 2024-03-19 ENCOUNTER — Telehealth: Payer: Self-pay

## 2024-03-19 ENCOUNTER — Other Ambulatory Visit (HOSPITAL_BASED_OUTPATIENT_CLINIC_OR_DEPARTMENT_OTHER): Payer: Self-pay

## 2024-03-19 ENCOUNTER — Other Ambulatory Visit: Payer: Self-pay

## 2024-03-19 DIAGNOSIS — R251 Tremor, unspecified: Secondary | ICD-10-CM

## 2024-03-19 MED ORDER — CLONAZEPAM 0.5 MG PO TABS
0.2500 mg | ORAL_TABLET | Freq: Two times a day (BID) | ORAL | 2 refills | Status: AC | PRN
  Filled 2024-03-19: qty 30, 15d supply, fill #0

## 2024-03-19 NOTE — Progress Notes (Signed)
 Pharmacy Quality Measure Review  This patient is appearing on a report for being at risk of failing the adherence measure for hypertension (ACEi/ARB) medications this calendar year.   Medication: telmisartan  Last fill date: 12/25/2023 for 90 day supply - Adherence report has last impactable date as 03/19/2024 and next refill due as 03/24/2024 which likely means she has already failed [facility] measure earlier in 2025 but she will be due to fill soon.   I spoke with [name] and she mentioned there wer several medications she was just about out of when she filled her medication container recently. She asked for refills for the following - telmisartan , clonazepam , ezetimibe , simvastatin  and isosorbide .   Coordinated with [facility] to refill meds needed.   Will follow up with patient in 1 to 2 months to review adherence.   [name], PharmD Clinical Pharmacist [facility]

## 2024-03-19 NOTE — Telephone Encounter (Signed)
 Copied from CRM #[id number]. Topic: Clinical - Prescription Issue >> Mar 19, 2024  9:57 AM [name] wrote: Reason for CRM: [facility] called in stated that patient needs it mailed to her, patient has refilled but cant honor refill because she needs it mail - needs a whole new prescription sent over [name] - [facility] [address] [place] [address] Phone: [phone] Fax: [phone]   clonazePAM  (KLONOPIN ) 0.5 MG tablet

## 2024-03-19 NOTE — Addendum Note (Signed)
 Addended by: WATT RAISIN C on: 03/19/2024 01:08 PM   Modules accepted: Orders

## 2024-03-27 ENCOUNTER — Other Ambulatory Visit (HOSPITAL_COMMUNITY): Payer: Self-pay

## 2024-03-29 ENCOUNTER — Ambulatory Visit (HOSPITAL_COMMUNITY)

## 2024-03-29 ENCOUNTER — Encounter: Admitting: Surgery

## 2024-03-30 NOTE — Progress Notes (Unsigned)
 [facility] [address], [address] [place], [place] [address] [phone]  Date:  03/31/2024   Name:  [name]   DOB:  1951-01-13   MRN:  [id number]  PCP:  Watt Harlene BROCKS, MD    Chief Complaint: No chief complaint on file.   History of Present Illness:  [name] is a 74 y.o. very pleasant female patient who presents with the following:  Patient seen today for follow-up.  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 Gretta, with vascular surgery just before Christmas Per his note: Patient was admitted with chest pain on 02/13/2024.  She states this was actually epigastric pain. She did have a CTA chest abdomen pelvis on 02/13/2024 with critical stenosis versus occlusion of the celiac artery of note the SMA and IMA were patent. /////// No classic symptoms of mesenteric ischemia on evaluation today.  I think this is an incidental finding unrelated to her admission.  Typically for chronic mesenteric ischemia need significant disease in 2 out of 3 vessels and she has a widely patent SMA and IMA.  No weight loss.  Eating okay.  I will follow-up in 1 year with repeat mesenteric duplex.   Her recent cardiac evaluation includes an stress test in 2022 Cardiac cath August 2022  She saw cardiology for follow-up most recently on December 18: Abdominal pain Not suspected to be cardiac in nature.  Celiac artery stenosis and esophageal spasm is on the differential given she has had worsening symptoms after drinking water and with food which is consistent with her critical celiac artery stenosis versus occlusion in which she already has a appointment scheduled with  vascular surgery.  Also reported alleviating pain after GI cocktail.  Suspect some element of anxiety as well.  She has had reassuring cardiac workup with negative troponins and stable EKG so suspicion for progressive CAD felt to be less likely.   Would like to see what vascular surgery says about her current presentation.  Before considering further cardiac workup. We discussed possibly pursuing NM PET stress testing if etiology of her pain is still unclear after their workup. CAD with DES to LAD 2010 -2021/2022 widely patent LAD stent, no obstructive CAD elsewhere As above, her presentation seems to be noncardiac.  Reassuring ischemic evaluations. Chronically has been on DAPT therapy with aspirin  and Plavix .  Suspect she can go on monotherapy now but message MD for final decision.  Baseline heart rates bradycardic so no need for beta-blocker. Continue with antianginals: Amlodipine  10 mg and Imdur  30 mg. LDL 96 9 days ago, goal less than 55.  She has had issues with myalgias in the past with statins, but tolerates simvastatin  40 mg, Zetia .  Sending to lipid clinic to help arrange PCSK9 inhibitor. Try to limit use of NSAIDs, occasionally takes this as needed for her knee. Lab Results  Component Value Date   HGBA1C 8.6 (H) 02/04/2024     Patient Active Problem List   Diagnosis Date Noted   Celiac artery stenosis 03/16/2024   Chest pain, rule out acute myocardial infarction 02/13/2024   Sepsis due to pneumonia (HCC) 08/16/2023   Acute on  chronic anemia 08/16/2023   Primary osteoarthritis of right knee 08/30/2021   Cervical radiculopathy 08/16/2021   Heartburn 01/30/2021   Chronic rhinitis 01/30/2021   DOE (dyspnea on exertion) 11/09/2020   Obstructive sleep apnea treated with continuous positive airway pressure (CPAP) 05/07/2018   Coronary artery disease involving native coronary artery of native heart with unstable angina pectoris (HCC) 11/03/2017   Insomnia 11/03/2017   Inadequate sleep  hygiene 11/03/2017   Anxiety 07/21/2017   Acquired foot deformity, left 07/18/2016   Osteopenia 11/10/2015   HNP (herniated nucleus pulposus), lumbar 10/12/2014   Spinal stenosis at L4-L5 level 10/12/2014   Iron deficiency anemia 07/29/2014   Type 2 diabetes mellitus (HCC) 07/29/2014   Environmental and seasonal allergies 04/28/2014   Spinal stenosis, lumbar region, with neurogenic claudication 01/06/2013   Obesity, Class II, BMI 35-39.9 10/20/2012   CAD S/P percutaneous coronary angioplasty    Hyperlipidemia associated with type 2 diabetes mellitus (HCC)    Hypertension associated with diabetes (HCC) 04/16/2012    Past Medical History:  Diagnosis Date   Anemia    Arthritis    Bronchitis    hx of    CAD (coronary artery disease)    a. s/p DES to LAD in 2010 with patent stent by cath in 2014 b. low-risk NST in 11/2016   Cataracts, bilateral    Celiac artery stenosis 2025   Diabetes mellitus without complication (HCC)    Family history of adverse reaction to anesthesia    pts mother had difficulty awakening    GERD (gastroesophageal reflux disease)    Hyperlipidemia LDL goal < 70    Hypertension    Lumbar back pain    Numbness    left leg and foot    Plantar fascia rupture    Left Foot   Sleep apnea    uses CPAP     Past Surgical History:  Procedure Laterality Date   ABDOMINAL HYSTERECTOMY     BACK SURGERY     BREAST CYST ASPIRATION  1995   CAROTID STENT  2009   pt denies    CORONARY ANGIOPLASTY WITH STENT PLACEMENT  2010and 10-02-2012   Stent DES, Xience to prox. LAD   DOPPLER ECHOCARDIOGRAPHY  08/01/2009   EF=>55%,LV normal   GIVENS CAPSULE STUDY N/A 11/11/2023   Procedure: IMAGING PROCEDURE, GI TRACT, INTRALUMINAL, VIA CAPSULE;  Surgeon: Kristie Lamprey, MD;  Location: [facility] ENDOSCOPY;  Service: Gastroenterology;  Laterality: N/A;   LEFT HEART CATH AND CORONARY ANGIOGRAPHY N/A 12/10/2019   Procedure: LEFT HEART CATH AND CORONARY ANGIOGRAPHY;  Surgeon: Claudene Victory ORN,  MD;  Location: [facility] INVASIVE CV LAB;  Service: Cardiovascular;  Laterality: N/A;   LEFT HEART CATH AND CORONARY ANGIOGRAPHY N/A 10/30/2020   Procedure: LEFT HEART CATH AND CORONARY ANGIOGRAPHY;  Surgeon: Court Dorn PARAS, MD;  Location: [facility] INVASIVE CV LAB;  Service: Cardiovascular;  Laterality: N/A;   LEFT HEART CATHETERIZATION WITH CORONARY ANGIOGRAM N/A 10/02/2012   Procedure: LEFT HEART CATHETERIZATION WITH CORONARY ANGIOGRAM;  Surgeon: Dorn PARAS Court, MD;  Location: [facility] CATH LAB;  Service: Cardiovascular;  Laterality: N/A;   lower arterial duplex  06/20/10   abi's normal,rgt 0.98,lft 1.06;bilateral PVRs normal   LUMBAR LAMINECTOMY/DECOMPRESSION MICRODISCECTOMY Left 01/06/2013   Procedure: MICRO LUMBAR DECOMPRESSION L4-5 AND L5-S1;  Surgeon: Reyes JAYSON Billing, MD;  Location: [facility] ORS;  Service: Orthopedics;  Laterality: Left;   LUMBAR LAMINECTOMY/DECOMPRESSION MICRODISCECTOMY Left 10/12/2014   Procedure: REVISION MICRO LUMBAR/DECOMPRESSION L4-5 LEFT ;  Surgeon: Reyes Billing, MD;  Location: [facility];  Service: Orthopedics;  Laterality: Left;   NM MYOCAR PERF WALL MOTION  09/22/2008   lexiscan -EF 83%; glogal LV systolic fx is norm. ,evidence of mild ischemia basal anterior,midanterior and apical lateral region(s).    ORIF PATELLA Right 08/29/2016   Procedure: OPEN REDUCTION INTERNAL (ORIF) FIXATION RIGHT PATELLA;  Surgeon: Fidel Rogue, MD;  Location: [facility] ORS;  Service: Orthopedics;  Laterality: Right;  Adductor Block   TUBAL LIGATION     TYMPANOPLASTY Bilateral    UVULOPALATOPHARYNGOPLASTY     pt denies     Social History[1]  Family History  Problem Relation Age of Onset   Coronary artery disease Mother    Rheum arthritis Mother    Dementia Mother    Heart attack Father    Hypertension Father    Hyperlipidemia Father    Other Father        MVA   Hypertension Brother    Cancer Brother    Heart disease Brother    Cancer Paternal Grandmother        stomach   Diabetes Paternal Grandfather     Breast cancer Neg Hx     Allergies[2]  Medication list has been reviewed and updated.  Medications Ordered Prior to Encounter[3]  Review of Systems:  ***  Physical Examination: There were no vitals filed for this visit. There were no vitals filed for this visit. There is no height or weight on file to calculate BMI. Ideal Body Weight:    ***  Assessment and Plan: No diagnosis found.  Assessment & Plan   Signed Harlene Schroeder, MD    [1]  Social History Tobacco Use   Smoking status: Never   Smokeless tobacco: Never  Vaping Use   Vaping status: Never Used  Substance Use Topics   Alcohol use: Not Currently   Drug use: No  [2]  Allergies Allergen Reactions   Crestor  [Rosuvastatin ] Other (See Comments)    myalgia   Niacin And Related Other (See Comments)    Whelps and skin flushed, mouth tingling  [3]  Current Outpatient Medications on File Prior to Visit  Medication Sig Dispense Refill   albuterol  (VENTOLIN  HFA) 108 (90 Base) MCG/ACT inhaler Inhale 2 puffs into the lungs every 6 (six) hours as needed for wheezing or shortness of breath. 8 g 0   amLODipine  (NORVASC ) 10 MG tablet Take 1 tablet (10 mg total) by mouth at bedtime. 90 tablet 3   aspirin  EC 81 MG EC tablet Take 1 tablet (81 mg total) by mouth daily. 30 tablet 1   cholecalciferol  (VITAMIN D ) 1000 UNITS tablet Take 1,000 Units by mouth daily.     clonazePAM  (KLONOPIN ) 0.5 MG tablet Take 0.5-1 tablets (0.25-0.5 mg total) by mouth 2 (two) times daily as needed for anxiety. 30 tablet 2   clopidogrel  (PLAVIX ) 75 MG tablet Take 1 tablet (75 mg total) by mouth daily. (Patient not taking: Reported on 03/19/2024) 90 tablet 3   donepezil  (ARICEPT ) 5 MG tablet Take 1 tablet (5 mg total) by mouth at bedtime. 90 tablet 3   ezetimibe  (ZETIA ) 10 MG tablet Take 1 tablet (10 mg total) by mouth daily. 90 tablet 3   famotidine  (PEPCID ) 20 MG tablet Take 1 tablet (20 mg total) by mouth daily. 90 tablet 0   fenofibrate   (TRICOR ) 145 MG tablet Take 1 tablet (145 mg) by mouth daily. 90 tablet 3   ferrous sulfate  325 (65 FE) MG tablet Take 325 mg by mouth daily with breakfast.  FLUoxetine  (PROZAC ) 20 MG capsule Take 1 capsule (20 mg total) by mouth daily. 90 capsule 3   fluticasone  (FLONASE ) 50 MCG/ACT nasal spray Place 1 spray into both nostrils daily as needed for allergies or rhinitis. 16 g 11   ibuprofen (ADVIL) 200 MG tablet Take 400 mg by mouth daily as needed for mild pain (knee pain).     icosapent  Ethyl (VASCEPA ) 1 g capsule Take 1 capsule (1 g total) by mouth 2 (two) times daily. (Patient taking differently: Take 1 g by mouth daily.) 180 capsule 3   isosorbide  mononitrate (IMDUR ) 30 MG 24 hr tablet Take 1 tablet (30 mg total) by mouth daily. 90 tablet 3   lidocaine -alum & mag hydroxide-simeth Drink 30 ml by mouth once daily as needed for esophageal spasm 180 mL 0   metFORMIN  (GLUCOPHAGE -XR) 500 MG 24 hr tablet Take 500 mg by mouth daily with breakfast.     Multiple Vitamin (MULTIVITAMIN WITH MINERALS) TABS tablet Take 1 tablet by mouth daily.     nitroGLYCERIN  (NITROSTAT ) 0.4 MG SL tablet Place 1 tablet under the tongue every 5 minutes as needed for chest pain. 25 tablet 2   ondansetron  (ZOFRAN ) 4 MG tablet Take 1 tablet (4 mg total) by mouth every 8 (eight) hours as needed for nausea or vomiting. 20 tablet 0   RABEprazole  (ACIPHEX ) 20 MG tablet Take 1 tablet (20 mg total) by mouth daily 30 minutes prior to food (Patient taking differently: Take 20 mg by mouth See admin instructions. Take 1 tablet by mouth 4 times weekly.) 90 tablet 3   Semaglutide  (RYBELSUS ) 3 MG TABS Take 1 tablet (3 mg total) by mouth daily. 30 tablet 2   simvastatin  (ZOCOR ) 40 MG tablet Take 1 tablet (40 mg total) by mouth daily. 90 tablet 3   telmisartan  (MICARDIS ) 40 MG tablet Take 1 tablet (40 mg total) by mouth daily. 90 tablet 3   UNABLE TO FIND Take 1 each by mouth daily. Med Name: Viactive Vitamin Caramel     [DISCONTINUED]  rosuvastatin  (CRESTOR ) 20 MG tablet Take 1 tablet (20 mg total) by mouth daily. 30 tablet 3   Current Facility-Administered Medications on File Prior to Visit  Medication Dose Route Frequency Provider Last Rate Last Admin   denosumab  (PROLIA ) injection 60 mg  60 mg Subcutaneous Q6 months Klani Caridi C, MD   60 mg at 11/10/23 0923   [START ON 05/08/2024] denosumab  (PROLIA ) injection 60 mg  60 mg Subcutaneous Q6 months Arlander Gillen C, MD       "

## 2024-03-31 ENCOUNTER — Ambulatory Visit (INDEPENDENT_AMBULATORY_CARE_PROVIDER_SITE_OTHER): Admitting: Family Medicine

## 2024-03-31 VITALS — BP 128/70 | HR 70 | Ht 62.0 in | Wt 198.4 lb

## 2024-03-31 DIAGNOSIS — R079 Chest pain, unspecified: Secondary | ICD-10-CM | POA: Diagnosis not present

## 2024-03-31 DIAGNOSIS — G8929 Other chronic pain: Secondary | ICD-10-CM

## 2024-03-31 DIAGNOSIS — R590 Localized enlarged lymph nodes: Secondary | ICD-10-CM | POA: Diagnosis not present

## 2024-03-31 NOTE — Patient Instructions (Addendum)
 Good to see you today- I will arrange for ultrasound of your underarms and a mammogram to be done at the [facility] in [facility] Address: [address], [place], [place] [address] Phone: [phone] I will reach out to Dr Mona regarding your chest pain  Please keep me posted   Let's visit in March to check on your diabetes

## 2024-04-02 ENCOUNTER — Encounter: Payer: Self-pay | Admitting: Family Medicine

## 2024-04-06 ENCOUNTER — Telehealth: Payer: Self-pay | Admitting: Family Medicine

## 2024-04-06 ENCOUNTER — Encounter: Attending: Psychology | Admitting: Psychology

## 2024-04-06 NOTE — Telephone Encounter (Signed)
 Copied from CRM [id number]. Topic: Appointments - Scheduling Inquiry for Clinic >> Apr 06, 2024  3:27 PM Alexandria E wrote: Reason for CRM: Patient is needing to get her 6 month Prolia  injection. Please call to schedule.

## 2024-04-06 NOTE — Progress Notes (Unsigned)
 "  NEUROPSYCHOLOGICAL EVALUATION [place]. [facility]  Physical Medicine and Rehabilitation     Patient: [name]  DOB: [birth year]  Age: 74 y.o. Sex: female  Race/Ethnicity: American Indian or [place]  Native *** Years of Ed.: ***  Collateral Source: ***  Referring Provider: Copland, Harlene BROCKS, MD Provider / Neuropsychologist: Evalene DOROTHA Riff, PsyD  Date of Service: @DOS @ Start: *** End: *** Location:  [place]. [facility] Physical Medicine & Rehabilitation Department [address], [place], [place] [address] Billing Code/Service: [phone] (1 Unit), [phone] (1 Unit) 1 hour and 15 minutes spent in face-to-face clinical interview and remaining 45 minutes was spent in record review, documentation, and testing protocol construction.   Individuals Present: The patient was seen by the provider, in-person, in the provider's outpatient office. The patient was ***  PATIENT CONSENT AND CONFIDENTIALITY The patient's understanding of the reason for referral was intact. Discussed limits of confidentiality including, but not limited to, posting of final evaluation report in the patient's electronic medical record for both the patient and for the referring provider and appropriate medical professionals. Patient was given the opportunity to have their questions answered. The neuropsychological evaluation process was discussed with the patient and they consented to proceed with the evaluation.  Consent for Evaluation and Treatment: Signed: Yes Explanation of Privacy Policies: Signed: Yes Discussion of Confidentiality Limits: Yes  REASON FOR REFERRAL & RECORD REVIEW The patient was referred for neuropsychological evaluation by ***   Upon interview, the patient ***  Started getting forgetful, husband thinks is progressing. Notices only that she is asking husband more regularly. Started using calendar more regularly. Wants to know what other things to  work on, recommendations, and tips.   HISTORY OF PRESENTING CONCERNS:  Cognitive Symptom Onset & Course:  Spouse thinks getting worse over the past 3-4 years. Gradual onset.  Continuous. Sisters started noticing.  Current Cognitive Complaints:  Memory:  Patient: Somewhat forgetting coversations. No consistent way of tracking appointments. Starting to use a calendar. Pill box for medications. Doesn't think forgets recent events.  Collateral: If not writing them down will not remember appointments. Occasionally forget a medication, maybe take the wrong dose. Sometimes needs to ask what day it is. He doesn't know the meds so can't say if not taking it correctly. Can remember recent events to some degree. Trouble getting lost when it is more distant. Not getting lost in familiar places. Repeating questions.   Processing Speed:  Patient: Feels like thinking speed is slower now.  Collateral: Reported noticing changes.    Attention & Concentration: Patient: Some declines in attention and concentration.  Collateral: Not noticing any changes.    Language:  Patient: Maybe slight word finding. No troubles understanding others.  Collateral: Not noticing any changes.    Visual-Spatial:  Patient: No clear changes.  Collateral: No clear changes.  Executive Functioning:  Patient: Problem solving not clearly changed. Unclear about changers in organization. Not noticed any major changes in own personality.  Collateral: Concerns about driving. Personality is unchanged. Declines in problem solving. Not sure about organization.   Motor/Sensory Complaints:  Sensory changes: Ear infections as a child. Hearing is adequate. Wears glasses. No changes smell or taste.  Balance/coordination difficulties: Needs to be more intentional when taking steps. On even ground is okay. One fall over the last year.  Frequent instances of dizziness/vertigo: Dizziness / lightheadedness maybe once or twice a month.  Other motor  difficulties: Tremors with stress. Some intermittent  tremor of head, couple times a month. Getting upset will trigger it.   Emotional and Behavioral Functioning:  Depression Symptomatology: No troubles with depression. No history of depression.  Anxiety Symptomatology:  Worrier, tense, trouble relaxing. Lifelong. Did some individual therapy within the last few years. At baseline levels in terms of anxiety overall. Six or seven years ago panic attack.  Other Symptomatology: No hallucination, no laundry list.   Sleep: 8-10 hours. No trouble falling, a little returning to sleep.  Appetite: Good.  Caffeine: Limited.   Alcohol Use: Very infrequent.  Tobacco Use: None Recreational Substance Use: None   Academic/Vocational History:  No learning trouble growing up. Went to 10th grade, quit and went back five times and got a masters degree. Went back to school several times. MBA. Bachelors nursing. 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   Psychosocial: Marital Status: 55 years.    Children/Grandchildren: 2 kids. 4 grandkids, 5  greats.    Living Situation: 2 in the home.    Daily Activities/Hobbies: ***    Level of Functional Independence: The patient is *** with basic activities of daily living.  Finances: Intact.    Shopping / Meal Preparation: Intact.    Household Maintenance / Chores: Intact.    Tracking Appointments / Future Obligations: Tracking medical appointments work in progress. Last year or so toruble with appointments.    Medication Management: Independent, but      Driving: Not driving currently.     Medical History/Record Review: Per records and patient report, History of traumatic brain injury/concussion: None since 2014-2015.   History of stroke: No  History of heart attack: No History of cancer/chemotherapy: No  History of seizure activity: No   History of known exposure to toxins: ***   Symptoms of chronic pain: No    Experience of frequent  headaches/migraines: Headaches infrequently.     Imaging/Lab Results: *** Past Medical History:  Diagnosis Date   Anemia    Arthritis    Bronchitis    hx of    CAD (coronary artery disease)    a. s/p DES to LAD in 2010 with patent stent by cath in 2014 b. low-risk NST in 11/2016   Cataracts, bilateral    Celiac artery stenosis 2025   Diabetes mellitus without complication (HCC)    Family history of adverse reaction to anesthesia    pts mother had difficulty awakening    GERD (gastroesophageal reflux disease)    Hyperlipidemia LDL goal < 70    Hypertension    Lumbar back pain    Numbness    left leg and foot    Plantar fascia rupture    Left Foot   Sleep apnea    uses CPAP    Patient Active Problem List   Diagnosis Date Noted   Celiac artery stenosis 03/16/2024   Chest pain, rule out acute myocardial infarction 02/13/2024   Sepsis due to pneumonia (HCC) 08/16/2023   Acute on chronic anemia 08/16/2023   Primary osteoarthritis of right knee 08/30/2021   Cervical radiculopathy 08/16/2021   Heartburn 01/30/2021   Chronic rhinitis 01/30/2021   DOE (dyspnea on exertion) 11/09/2020   Obstructive sleep apnea treated with continuous positive airway pressure (CPAP) 05/07/2018   Coronary artery disease involving native coronary artery of native heart with unstable angina pectoris (HCC) 11/03/2017   Insomnia 11/03/2017   Inadequate sleep hygiene 11/03/2017   Anxiety 07/21/2017   Acquired foot deformity, left 07/18/2016   Osteopenia 11/10/2015   HNP (  herniated nucleus pulposus), lumbar 10/12/2014   Spinal stenosis at L4-L5 level 10/12/2014   Iron deficiency anemia 07/29/2014   Type 2 diabetes mellitus (HCC) 07/29/2014   Environmental and seasonal allergies 04/28/2014   Spinal stenosis, lumbar region, with neurogenic claudication 01/06/2013   Obesity, Class II, BMI 35-39.9 10/20/2012   CAD S/P percutaneous coronary angioplasty    Hyperlipidemia associated with type 2 diabetes  mellitus (HCC)    Hypertension associated with diabetes (HCC) 04/16/2012   Started on GLP-1 few days ago.   Family Neurologic/Medical Hx: And dementia in father in 11s Family History  Problem Relation Age of Onset   Coronary artery disease Mother    Rheum arthritis Mother    Dementia Mother    Heart attack Father    Hypertension Father    Hyperlipidemia Father    Other Father        MVA   Hypertension Brother    Cancer Brother    Heart disease Brother    Cancer Paternal Grandmother        stomach   Diabetes Paternal Grandfather    Breast cancer Neg Hx     Medications:  albuterol  (VENTOLIN  HFA) 108 (90 Base) MCG/ACT inhaler  amLODipine  (NORVASC ) 10 MG tablet aspirin  EC 81 MG EC tablet cholecalciferol  (VITAMIN D ) 1000 UNITS tablet  clonazePAM  (KLONOPIN ) 0.5 MG tablet - 1 a day. Take in the morning. For anxiety.  clopidogrel  (PLAVIX ) 75 MG tablet  donepezil  (ARICEPT ) 5 MG tablet  ezetimibe  (ZETIA ) 10 MG tablet  famotidine  (PEPCID ) 20 MG tablet  fenofibrate  (TRICOR ) 145 MG tablet ferrous sulfate  325 (65 FE) MG tablet  FLUoxetine  (PROZAC ) 20 MG capsule  fluticasone  (FLONASE ) 50 MCG/ACT nasal spray ibuprofen (ADVIL) 200 MG tablet  icosapent  Ethyl (VASCEPA ) 1 g capsule  isosorbide  mononitrate (IMDUR ) 30 MG 24 hr tablet lidocaine -alum & mag hydroxide-simeth metFORMIN  (GLUCOPHAGE -XR) 500 MG 24 hr tablet Multiple Vitamin (MULTIVITAMIN WITH MINERALS) TABS tablet  nitroGLYCERIN  (NITROSTAT ) 0.4 MG SL tablet ondansetron  (ZOFRAN ) 4 MG tablet  RABEprazole  (ACIPHEX ) 20 MG tablet Semaglutide  (RYBELSUS ) 3 MG TABS  simvastatin  (ZOCOR ) 40 MG tablet telmisartan  (MICARDIS ) 40 MG tablet        Mental Status/Behavioral Observations: The patient was seen on an outpatient basis in the [facility] PM&R office for the clinical interview *** Sensorium/Arousal: ***   Orientation: ***   Appearance: ***   Behavior: ***   Speech/Language: ***   Motor: ***   Social  Comportment: ***   Mood: ***   Affect: ***   Thought Process/Content: ***   Ability to Participate in Interview: ***   Insight: ***    SUMMARY / CLINICAL IMPRESSIONS ***  DISPOSITION / PLAN The patient has been set up for a formal neuropsychological assessment to objectively assess her cognitive functioning across domains to establish the patient's cognitive profile. This data, in conjunction with information obtained via clinical interview and medical record review, will help clarify likely etiology and guide treatment recommendations. Once data collection and interpretation have been completed, the findings / diagnosis and recommendations will be reviewed and discussed with the patient during a feedback appointment with the neuropsychologist. Based on the collaborative dialogue with the patient during the feedback, recommendations may be adjusted / tailored as needed. A formal report will be produced and provided to the patient and the referring provider.   Diagnosis:    FULL REPORT TO FOLLOW   Evalene DOROTHA Riff, PsyD [facility]psych[facility] [address] [place], [place] [address] Main: [phone]  Fax: [phone] [place] License # [id number]  This report was generated using voice recognition software. While this document has been carefully reviewed, transcription errors may be present. I apologize in advance for any inconvenience. Please contact me if further clarification is needed.  "

## 2024-04-07 ENCOUNTER — Telehealth: Payer: Self-pay

## 2024-04-07 NOTE — Telephone Encounter (Signed)
 Message has been sent to team Prolia .

## 2024-04-07 NOTE — Telephone Encounter (Signed)
 Copied from CRM [id number]. Topic: Appointments - Scheduling Inquiry for Clinic >> Apr 06, 2024  3:27 PM Alexandria E wrote: Reason for CRM: Patient is needing to get her 6 month Prolia  injection. Please call to schedule. >> Apr 06, 2024  4:12 PM Roselie BROCKS wrote: Patient is calling once more to get her 6 month Prolia  injection scheduled. Please return call to patient.

## 2024-04-07 NOTE — Telephone Encounter (Signed)
 Spoke with patient and advised her that we will call her as soon as her benefits come back.  She is due on or around 05/13/24.

## 2024-04-14 ENCOUNTER — Telehealth: Payer: Self-pay

## 2024-04-14 NOTE — Telephone Encounter (Signed)
 Prolia  VOB initiated via [email or web address]  Next Prolia  inj DUE: 05/12/24

## 2024-04-15 ENCOUNTER — Other Ambulatory Visit (HOSPITAL_COMMUNITY): Payer: Self-pay

## 2024-04-15 NOTE — Telephone Encounter (Signed)
 Pt ready for scheduling for PROLIA  on or after : 05/12/24  Option# 1: Buy/Bill (Office supplied medication)  Out-of-pocket cost due at time of clinic visit: $352  Number of injection/visits approved: ---  Primary: [employer] Prolia  co-insurance: 20% Admin fee co-insurance: 0%  Secondary: --- Prolia  co-insurance:  Admin fee co-insurance:   Medical Benefit Details: Date Benefits were checked: 04/15/24 Deductible: NO/ Coinsurance: 20%/ Admin Fee: 0%  Prior Auth: N/A PA# Expiration Date:   # of doses approved: ----------------------------------------------------------------------- Option# 2- Med Obtained from pharmacy: [facility]  Pharmacy benefit: Copay $763.14 (Paid to pharmacy) Admin Fee: 0% (Pay at clinic)  Prior Auth: N/A PA# Expiration Date:   # of doses approved:   If patient wants fill through the pharmacy benefit please send prescription to: [facility], and include estimated need by date in rx notes. Pharmacy will ship medication directly to the office.  Patient NOT eligible for Prolia  Copay Card. Copay Card can make patient's cost as little as $25. Link to apply: [email or web address]://www.[email or web address]  ** This summary of benefits is an estimation of the patient's out-of-pocket cost. Exact cost may very based on individual plan coverage.

## 2024-04-16 ENCOUNTER — Encounter

## 2024-04-20 ENCOUNTER — Other Ambulatory Visit: Payer: Self-pay

## 2024-04-21 ENCOUNTER — Encounter: Admitting: Psychology

## 2024-04-23 ENCOUNTER — Ambulatory Visit

## 2024-04-23 NOTE — Progress Notes (Unsigned)
 Patient ID: [name]                 DOB: [birth year]                    MRN: [id number]      HPI: [name] is a 74 y.o. female patient referred to lipid clinic by Thom Sluder, PA. PMH is significant for CAD DES to pLAD 2010, RBBB, HTN, diabetes, HLD and OSA.  Patient was last evaluated by cardiology provider in December 2025. Most recent lipid panel revealed LDL of 96 above her goal of < 55. She has hx of statin intolerance, but tolerates simvastatin  and ezetimibe . Patient referred to PharmD lipid clinic.  Patient presents today    Reviewed options for lowering LDL cholesterol, including ezetimibe , PCSK-9 inhibitors, bempedoic acid and inclisiran.  Discussed mechanisms of action, dosing, side effects and potential decreases in LDL cholesterol.  Also reviewed cost information and potential options for patient assistance.   Current Medications: simvastatin  40 mg daily and ezetimibe  10 mg daily Intolerances: rosuvastatin  (myalgias) Risk Factors: age, CAD DES to pLAD 2010, HTN, diabetes, OSA. LDL goal: < 55 Lipid panel (01/2024): Chol 175, Trig 178, HDL 43, LDL 96 Liver enzymes (01/2024): ALT 22, AST 28, Alk phos 49  Diet:  Breakfast: Lunch/Dinner: Snacks: Beverages:  Exercise:   Family History:  Relation Problem Comments  Mother (Deceased at age 56) Coronary artery disease   Dementia   Rheum arthritis     Father (Deceased at age 21) Heart attack   Hyperlipidemia   Hypertension   Other MVA    Sister Metallurgist)   Sister Metallurgist)   Brother (Deceased) Cancer   Heart disease   Hypertension     Brother Metallurgist)   Brother (Alive)   Maternal Grandmother (Deceased)   Maternal Grandfather (Deceased)   Paternal Grandmother (Deceased) Cancer stomach    Paternal Grandfather (Deceased) Diabetes     Daughter (Alive)   Son (Alive)   Neg Hx Breast cancer        Social History:  Alcohol: Smoking:  Labs:  Lipid Panel     Component Value Date/Time   CHOL 175  02/04/2024 0919   CHOL 180 01/25/2022 1202   TRIG 178.0 (H) 02/04/2024 0919   HDL 43.10 02/04/2024 0919   HDL 40 01/25/2022 1202   CHOLHDL 4 02/04/2024 0919   VLDL 35.6 02/04/2024 0919   LDLCALC 96 02/04/2024 0919   LDLCALC 112 (H) 01/25/2022 1202   LDLCALC  12/20/2019 0945     Comment:     . LDL cholesterol not calculated. Triglyceride levels greater than 400 mg/dL invalidate calculated LDL results. . Reference range: <100 . Desirable range <100 mg/dL for primary prevention;   <70 mg/dL for patients with CHD or diabetic patients  with > or = 2 CHD risk factors. SABRA LDL-C is now calculated using the Martin-Hopkins  calculation, which is a validated novel method providing  better accuracy than the Friedewald equation in the  estimation of LDL-C.  [name] et al. SANDREA. 7986;689(80): 2061-2068  (http://education.QuestDiagnostics.com/faq/FAQ164)    LDLDIRECT 82.0 11/16/2018 1010   LABVLDL 28 01/25/2022 1202    Past Medical History:  Diagnosis Date   Anemia    Arthritis    Bronchitis    hx of    CAD (coronary artery disease)    a. s/p DES to LAD in 2010 with patent stent by cath in 2014 b. low-risk NST in 11/2016   Cataracts,  bilateral    Celiac artery stenosis 2025   Diabetes mellitus without complication (HCC)    Family history of adverse reaction to anesthesia    pts mother had difficulty awakening    GERD (gastroesophageal reflux disease)    Hyperlipidemia LDL goal < 70    Hypertension    Lumbar back pain    Numbness    left leg and foot    Plantar fascia rupture    Left Foot   Sleep apnea    uses CPAP     Medications Ordered Prior to Encounter[1]  Allergies[2]  Assessment/Plan:  1. Hyperlipidemia -  No problems updated. No problem-specific Assessment & Plan notes found for this encounter.    Thank you,  Sly Parlee E. Devantae Babe, Pharm.D, CPP [place] [facility]sp[facility] BIR[facility]. [facility] [address], [place], [place]  [address] Phone: [phone]; Fax: [phone]        [1]  Current Outpatient Medications on File Prior to Visit  Medication Sig Dispense Refill   albuterol  (VENTOLIN  HFA) 108 (90 Base) MCG/ACT inhaler Inhale 2 puffs into the lungs every 6 (six) hours as needed for wheezing or shortness of breath. 8 g 0   amLODipine  (NORVASC ) 10 MG tablet Take 1 tablet (10 mg total) by mouth at bedtime. 90 tablet 3   aspirin  EC 81 MG EC tablet Take 1 tablet (81 mg total) by mouth daily. 30 tablet 1   cholecalciferol  (VITAMIN D ) 1000 UNITS tablet Take 1,000 Units by mouth daily.     clonazePAM  (KLONOPIN ) 0.5 MG tablet Take 0.5-1 tablets (0.25-0.5 mg total) by mouth 2 (two) times daily as needed for anxiety. 30 tablet 2   clopidogrel  (PLAVIX ) 75 MG tablet Take 1 tablet (75 mg total) by mouth daily. (Patient not taking: Reported on 03/31/2024) 90 tablet 3   donepezil  (ARICEPT ) 5 MG tablet Take 1 tablet (5 mg total) by mouth at bedtime. 90 tablet 3   ezetimibe  (ZETIA ) 10 MG tablet Take 1 tablet (10 mg total) by mouth daily. 90 tablet 3   famotidine  (PEPCID ) 20 MG tablet Take 1 tablet (20 mg total) by mouth daily. 90 tablet 0   fenofibrate  (TRICOR ) 145 MG tablet Take 1 tablet (145 mg) by mouth daily. 90 tablet 3   ferrous sulfate  325 (65 FE) MG tablet Take 325 mg by mouth daily with breakfast.     FLUoxetine  (PROZAC ) 20 MG capsule Take 1 capsule (20 mg total) by mouth daily. 90 capsule 3   fluticasone  (FLONASE ) 50 MCG/ACT nasal spray Place 1 spray into both nostrils daily as needed for allergies or rhinitis. 16 g 11   ibuprofen (ADVIL) 200 MG tablet Take 400 mg by mouth daily as needed for mild pain (knee pain).     icosapent  Ethyl (VASCEPA ) 1 g capsule Take 1 capsule (1 g total) by mouth 2 (two) times daily. (Patient taking differently: Take 1 g by mouth daily.) 180 capsule 3   isosorbide  mononitrate (IMDUR ) 30 MG 24 hr tablet Take 1 tablet (30 mg total) by mouth daily. 90 tablet 3   lidocaine -alum & mag  hydroxide-simeth Drink 30 ml by mouth once daily as needed for esophageal spasm 180 mL 0   metFORMIN  (GLUCOPHAGE -XR) 500 MG 24 hr tablet Take 500 mg by mouth daily with breakfast.     Multiple Vitamin (MULTIVITAMIN WITH MINERALS) TABS tablet Take 1 tablet by mouth daily.     nitroGLYCERIN  (NITROSTAT ) 0.4 MG SL tablet Place 1 tablet under  the tongue every 5 minutes as needed for chest pain. 25 tablet 2   ondansetron  (ZOFRAN ) 4 MG tablet Take 1 tablet (4 mg total) by mouth every 8 (eight) hours as needed for nausea or vomiting. 20 tablet 0   RABEprazole  (ACIPHEX ) 20 MG tablet Take 1 tablet (20 mg total) by mouth daily 30 minutes prior to food (Patient taking differently: Take 20 mg by mouth See admin instructions. Take 1 tablet by mouth 4 times weekly.) 90 tablet 3   Semaglutide  (RYBELSUS ) 3 MG TABS Take 1 tablet (3 mg total) by mouth daily. 30 tablet 2   simvastatin  (ZOCOR ) 40 MG tablet Take 1 tablet (40 mg total) by mouth daily. 90 tablet 3   telmisartan  (MICARDIS ) 40 MG tablet Take 1 tablet (40 mg total) by mouth daily. 90 tablet 3   UNABLE TO FIND Take 1 each by mouth daily. Med Name: Viactive Vitamin Caramel     [DISCONTINUED] rosuvastatin  (CRESTOR ) 20 MG tablet Take 1 tablet (20 mg total) by mouth daily. 30 tablet 3   Current Facility-Administered Medications on File Prior to Visit  Medication Dose Route Frequency Provider Last Rate Last Admin   denosumab  (PROLIA ) injection 60 mg  60 mg Subcutaneous Q6 months Copland, Jessica C, MD   60 mg at 11/10/23 0923   [START ON 05/08/2024] denosumab  (PROLIA ) injection 60 mg  60 mg Subcutaneous Q6 months Copland, Jessica C, MD      [2]  Allergies Allergen Reactions   Crestor  [Rosuvastatin ] Other (See Comments)    myalgia   Niacin And Related Other (See Comments)    [place] and skin flushed, mouth tingling

## 2024-04-26 ENCOUNTER — Other Ambulatory Visit (HOSPITAL_COMMUNITY): Payer: Self-pay

## 2024-04-26 ENCOUNTER — Other Ambulatory Visit: Payer: Self-pay | Admitting: Family Medicine

## 2024-04-28 ENCOUNTER — Other Ambulatory Visit: Payer: Self-pay | Admitting: Family Medicine

## 2024-04-28 ENCOUNTER — Ambulatory Visit: Payer: Self-pay | Admitting: Pharmacist

## 2024-04-28 ENCOUNTER — Other Ambulatory Visit: Payer: Self-pay

## 2024-04-28 ENCOUNTER — Other Ambulatory Visit (HOSPITAL_COMMUNITY): Payer: Self-pay

## 2024-04-28 ENCOUNTER — Other Ambulatory Visit: Payer: Self-pay | Admitting: Adult Health

## 2024-04-28 ENCOUNTER — Telehealth: Payer: Self-pay

## 2024-04-28 DIAGNOSIS — E785 Hyperlipidemia, unspecified: Secondary | ICD-10-CM

## 2024-04-28 MED ORDER — FAMOTIDINE 20 MG PO TABS
20.0000 mg | ORAL_TABLET | Freq: Every day | ORAL | 1 refills | Status: AC
  Filled 2024-04-28: qty 30, 30d supply, fill #0

## 2024-04-28 MED ORDER — TELMISARTAN 40 MG PO TABS
40.0000 mg | ORAL_TABLET | Freq: Every day | ORAL | 3 refills | Status: AC
  Filled 2024-04-28: qty 90, 90d supply, fill #0

## 2024-04-28 MED ORDER — ICOSAPENT ETHYL 1 G PO CAPS
1.0000 g | ORAL_CAPSULE | Freq: Every day | ORAL | 3 refills | Status: AC
  Filled 2024-04-28: qty 30, 30d supply, fill #0

## 2024-04-28 NOTE — Telephone Encounter (Signed)
 Advised [name] that she will be getting thru buy and bill.

## 2024-04-28 NOTE — Progress Notes (Unsigned)
 PCP Harlene Copland   Delivery Method: PRNs: OTCs:  Adherence Packaging Day Supplies: Total # of Meds in ATP: 12 meds total  Medications not in ATP: 6   Medication Mapping Morning Lunch Evening Bedtime  Amlodipine  10mg      ASA 81mg       Vit D      Clopidogrel  75mg      Ezetimibe  10mg     Donepezil  5mg    Fenofibrate  145mg      Fluoxetine  20mg       Vascepa      Isosorbide  30mg      Simvastatin  40mg       Telmisartan  40mg        3 morning packs and 1 night pack - informed pt    Clonazepam  0.5mg   Famotidine  PRN Nasal spray  Ibuprofen OTC Rybelsus  3mg  - original packaging  Ondansetron  Ferrous sulfate 

## 2024-04-28 NOTE — Progress Notes (Signed)
 Disenrolled; Note per CMA Edison Bunker pt will be getting from office buy and bill

## 2024-04-28 NOTE — Telephone Encounter (Signed)
 Copied from CRM #[id number]. Topic: Clinical - Medication Question >> Apr 28, 2024  9:54 AM Mesmerise C wrote: Reason for CRM: Emmie from [facility] pharmacy checking if denosumab  (PROLIA ) injection 60 mg will be sent to their pharmacy or will pt get it from the clinic states it over $700 for her to get it there and she keeps showing in their call list

## 2024-04-28 NOTE — Progress Notes (Signed)
 Opened in error

## 2024-04-29 ENCOUNTER — Encounter

## 2024-04-29 ENCOUNTER — Encounter: Admitting: Psychology

## 2024-05-05 ENCOUNTER — Encounter: Admitting: Psychology

## 2024-05-11 ENCOUNTER — Encounter: Admitting: Psychology

## 2024-05-12 ENCOUNTER — Encounter

## 2024-05-13 ENCOUNTER — Ambulatory Visit

## 2024-05-19 ENCOUNTER — Other Ambulatory Visit

## 2024-05-19 ENCOUNTER — Encounter

## 2024-05-20 ENCOUNTER — Encounter: Admitting: Psychology

## 2024-05-27 ENCOUNTER — Ambulatory Visit: Admitting: Family Medicine

## 2024-05-28 ENCOUNTER — Encounter: Admitting: Psychology

## 2024-05-31 IMAGING — CR DG CERVICAL SPINE COMPLETE 4+V
5 series · 5 of 5 positions shown · non-contrast
Comparison: None Available.

CLINICAL DATA: Left neck pain radiating to the left elbow for 8
days.

EXAM:
CERVICAL SPINE - COMPLETE 4+ VIEW

[w cervical spine lat]
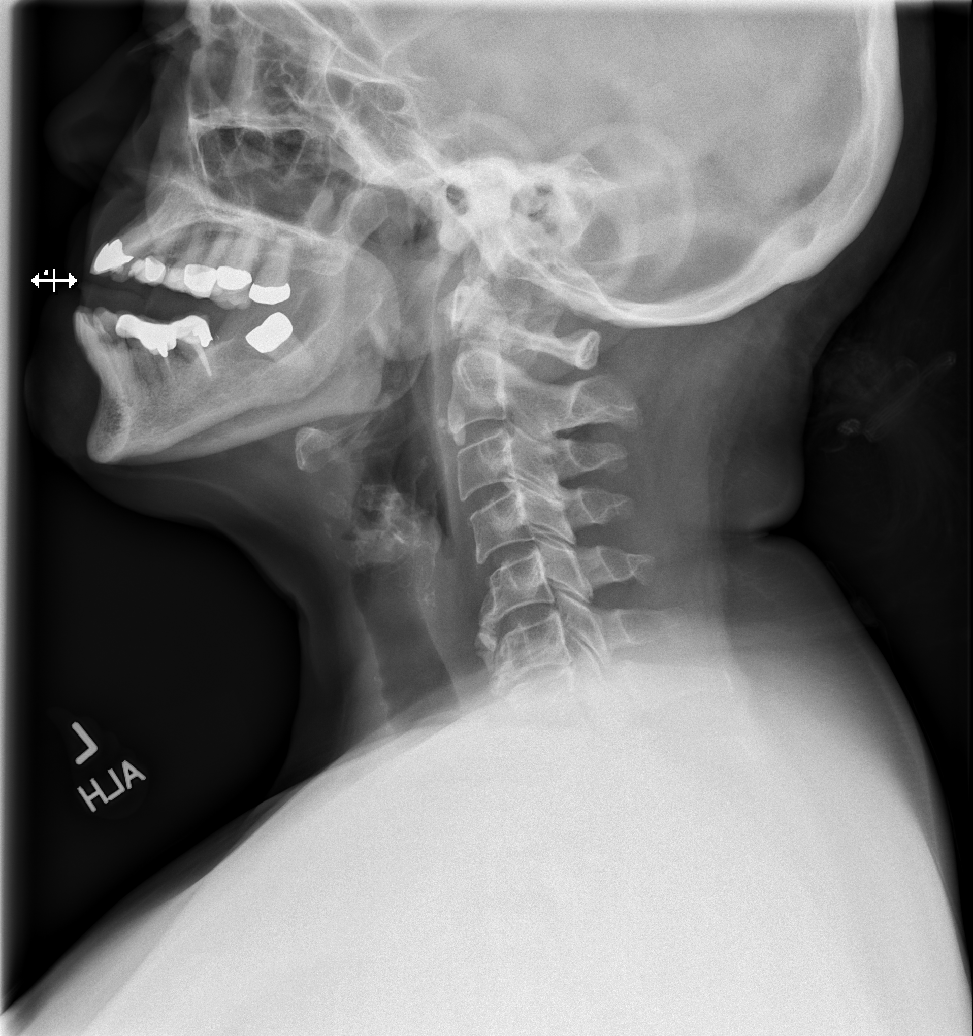

[w cervical spine ap_obl (1 of 2)]
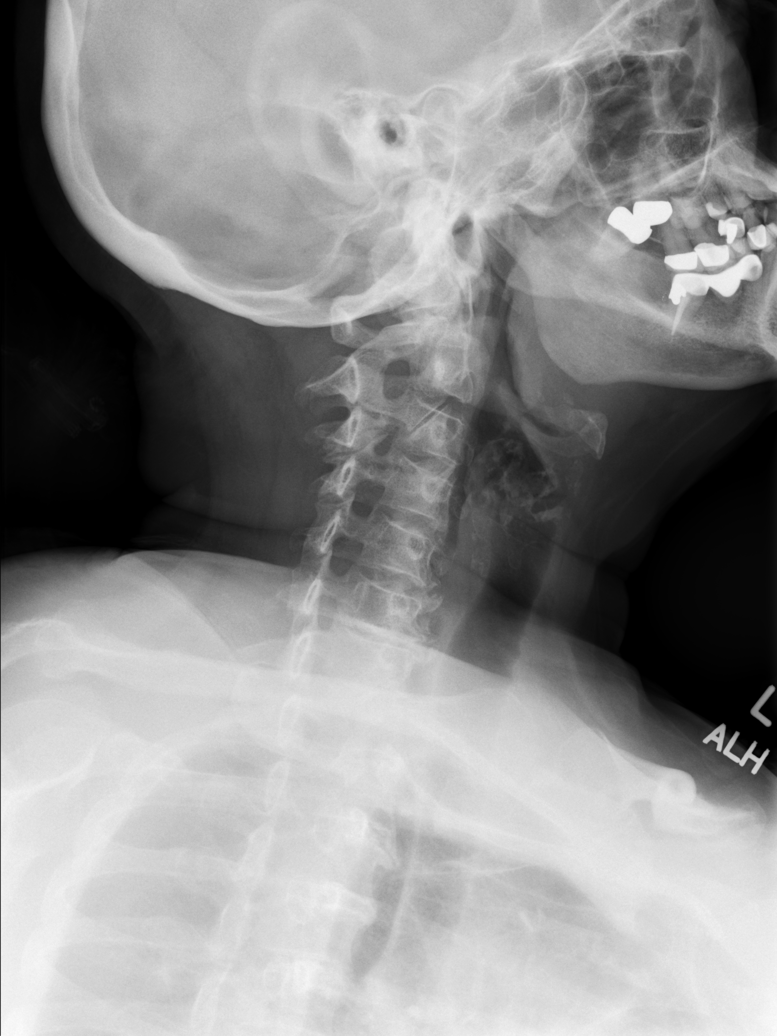

[w cervical spine ap_obl (2 of 2)]
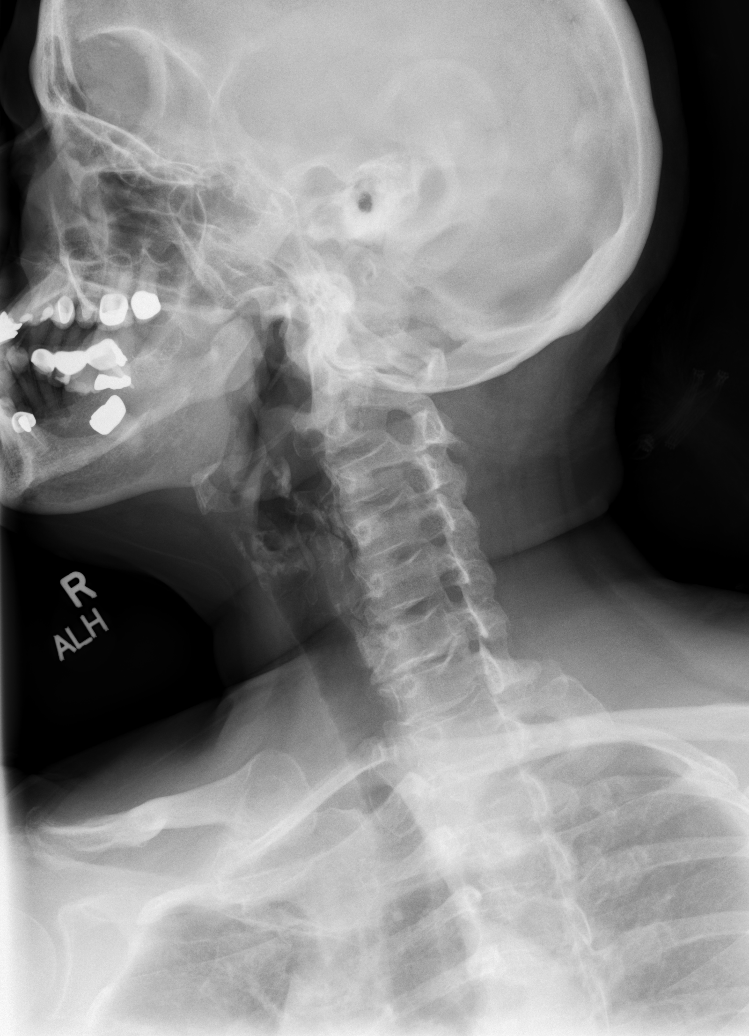

[w cervical spine ap]
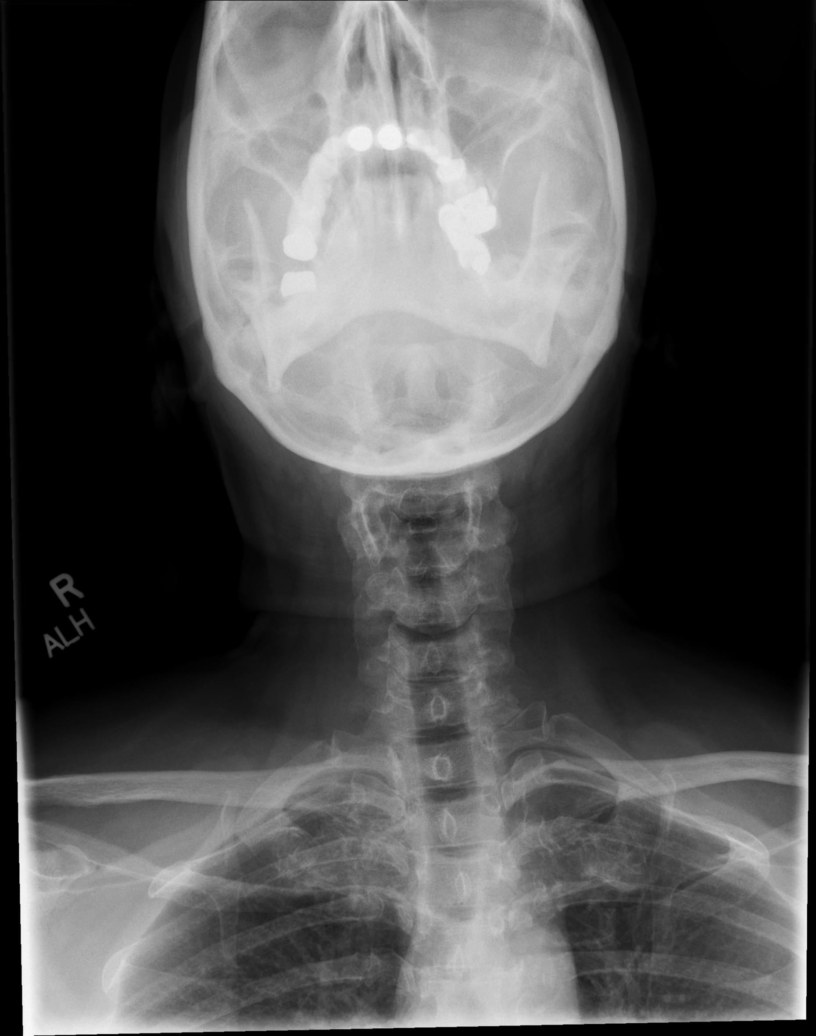

[w cervical spine odontoid]
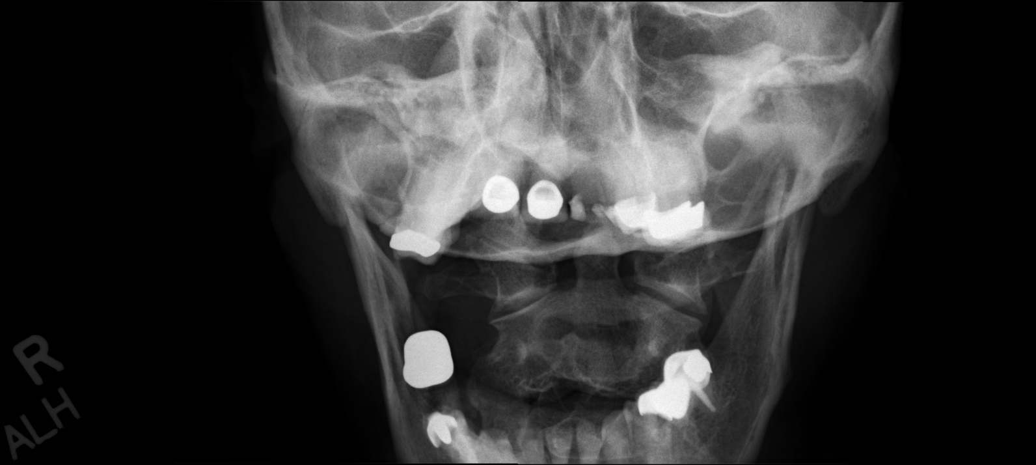

[5 of 5 positions shown; findings below may reference images not displayed]

FINDINGS: There is no evidence of cervical spine fracture or prevertebral soft
tissue swelling. Alignment is normal. moderate degenerative joint
changes of the C5 through C7 level with narrowed joint space and
osteophyte formation are noted. There is mild narrowing of the right
C3-4, minimal narrowing left C3-4, C4-5, C5-6 and C6-7 neural
foramina due to osteophyte encroachment.
IMPRESSION: No acute fracture or dislocation. Degenerative joint changes of the
cervical spine.

## 2024-06-09 ENCOUNTER — Ambulatory Visit: Admitting: Internal Medicine

## 2025-02-22 ENCOUNTER — Ambulatory Visit
